# Patient Record
Sex: Female | Born: 1935
Health system: Southern US, Community
[De-identification: ages and names within clinical notes are randomized; demographics above are authoritative.]

## PROBLEM LIST (undated history)

## (undated) DIAGNOSIS — E506 Vitamin A deficiency with xerophthalmic scars of cornea: Secondary | ICD-10-CM

## (undated) DIAGNOSIS — I059 Rheumatic mitral valve disease, unspecified: Secondary | ICD-10-CM

## (undated) DIAGNOSIS — M19041 Primary osteoarthritis, right hand: Secondary | ICD-10-CM

## (undated) DIAGNOSIS — R569 Unspecified convulsions: Secondary | ICD-10-CM

## (undated) DIAGNOSIS — F0390 Unspecified dementia without behavioral disturbance: Secondary | ICD-10-CM

## (undated) DIAGNOSIS — R42 Dizziness and giddiness: Secondary | ICD-10-CM

## (undated) DIAGNOSIS — Z923 Personal history of irradiation: Secondary | ICD-10-CM

## (undated) DIAGNOSIS — M205X9 Other deformities of toe(s) (acquired), unspecified foot: Secondary | ICD-10-CM

## (undated) DIAGNOSIS — F070 Personality change due to known physiological condition: Secondary | ICD-10-CM

## (undated) DIAGNOSIS — L84 Corns and callosities: Secondary | ICD-10-CM

## (undated) DIAGNOSIS — M545 Low back pain, unspecified: Secondary | ICD-10-CM

## (undated) DIAGNOSIS — M461 Sacroiliitis, not elsewhere classified: Secondary | ICD-10-CM

## (undated) DIAGNOSIS — K5790 Diverticulosis of intestine, part unspecified, without perforation or abscess without bleeding: Secondary | ICD-10-CM

## (undated) DIAGNOSIS — M2550 Pain in unspecified joint: Secondary | ICD-10-CM

## (undated) DIAGNOSIS — C3490 Malignant neoplasm of unspecified part of unspecified bronchus or lung: Secondary | ICD-10-CM

## (undated) DIAGNOSIS — M19042 Primary osteoarthritis, left hand: Secondary | ICD-10-CM

## (undated) DIAGNOSIS — E559 Vitamin D deficiency, unspecified: Secondary | ICD-10-CM

## (undated) DIAGNOSIS — D649 Anemia, unspecified: Secondary | ICD-10-CM

## (undated) DIAGNOSIS — K921 Melena: Secondary | ICD-10-CM

## (undated) DIAGNOSIS — R300 Dysuria: Secondary | ICD-10-CM

## (undated) DIAGNOSIS — R7989 Other specified abnormal findings of blood chemistry: Secondary | ICD-10-CM

## (undated) DIAGNOSIS — F411 Generalized anxiety disorder: Secondary | ICD-10-CM

## (undated) DIAGNOSIS — E039 Hypothyroidism, unspecified: Secondary | ICD-10-CM

## (undated) DIAGNOSIS — M1990 Unspecified osteoarthritis, unspecified site: Secondary | ICD-10-CM

## (undated) HISTORY — DX: Personal history of irradiation: Z92.3

## (undated) HISTORY — DX: Sacroiliitis, not elsewhere classified: M46.1

## (undated) HISTORY — DX: Hypothyroidism, unspecified: E03.9

## (undated) HISTORY — DX: Primary osteoarthritis, left hand: M19.042

## (undated) HISTORY — DX: Rheumatic mitral valve disease, unspecified: I05.9

## (undated) HISTORY — DX: Generalized anxiety disorder: F41.1

## (undated) HISTORY — DX: Anemia, unspecified: D64.9

## (undated) HISTORY — DX: Other deformities of toe(s) (acquired), unspecified foot: M20.5X9

## (undated) HISTORY — DX: Dizziness and giddiness: R42

## (undated) HISTORY — PX: OTHER SURGICAL HISTORY: SHX169

## (undated) HISTORY — DX: Personality change due to known physiological condition: F07.0

## (undated) HISTORY — DX: Vitamin D deficiency, unspecified: E55.9

## (undated) HISTORY — DX: Primary osteoarthritis, right hand: M19.041

## (undated) HISTORY — DX: Unspecified osteoarthritis, unspecified site: M19.90

## (undated) HISTORY — DX: Pain in unspecified joint: M25.50

## (undated) HISTORY — DX: Unspecified convulsions: R56.9

## (undated) HISTORY — DX: Other specified abnormal findings of blood chemistry: R79.89

## (undated) HISTORY — DX: Corns and callosities: L84

## (undated) HISTORY — DX: Vitamin A deficiency with xerophthalmic scars of cornea: E50.6

## (undated) HISTORY — DX: Low back pain, unspecified: M54.50

## (undated) HISTORY — DX: Dysuria: R30.0

## (undated) HISTORY — DX: Low back pain: M54.5

## (undated) HISTORY — PX: JOINT REPLACEMENT: SHX530

## (undated) HISTORY — DX: Diverticulosis of intestine, part unspecified, without perforation or abscess without bleeding: K57.90

## (undated) HISTORY — PX: IVC FILTER PLACEMENT (ARMC HX): HXRAD1551

## (undated) HISTORY — DX: Melena: K92.1

---

## 1997-10-26 ENCOUNTER — Ambulatory Visit (HOSPITAL_COMMUNITY): Admission: RE | Admit: 1997-10-26 | Discharge: 1997-10-26 | Payer: Self-pay | Admitting: Neurosurgery

## 1997-11-09 ENCOUNTER — Ambulatory Visit (HOSPITAL_COMMUNITY): Admission: RE | Admit: 1997-11-09 | Discharge: 1997-11-09 | Payer: Self-pay | Admitting: Neurosurgery

## 2000-04-21 ENCOUNTER — Other Ambulatory Visit: Admission: RE | Admit: 2000-04-21 | Discharge: 2000-04-21 | Payer: Self-pay | Admitting: Internal Medicine

## 2001-06-16 ENCOUNTER — Encounter: Payer: Self-pay | Admitting: Internal Medicine

## 2002-07-06 ENCOUNTER — Other Ambulatory Visit: Admission: RE | Admit: 2002-07-06 | Discharge: 2002-07-06 | Payer: Self-pay | Admitting: Internal Medicine

## 2003-04-16 ENCOUNTER — Emergency Department (HOSPITAL_COMMUNITY): Admission: EM | Admit: 2003-04-16 | Discharge: 2003-04-16 | Payer: Self-pay | Admitting: Emergency Medicine

## 2003-05-20 HISTORY — PX: CERVICAL LAMINECTOMY: SHX94

## 2003-05-24 ENCOUNTER — Inpatient Hospital Stay (HOSPITAL_COMMUNITY): Admission: RE | Admit: 2003-05-24 | Discharge: 2003-05-30 | Payer: Self-pay | Admitting: Orthopaedic Surgery

## 2003-05-25 ENCOUNTER — Encounter (INDEPENDENT_AMBULATORY_CARE_PROVIDER_SITE_OTHER): Payer: Self-pay | Admitting: *Deleted

## 2003-08-26 ENCOUNTER — Ambulatory Visit (HOSPITAL_COMMUNITY): Admission: RE | Admit: 2003-08-26 | Discharge: 2003-08-26 | Payer: Self-pay | Admitting: Orthopaedic Surgery

## 2004-01-04 ENCOUNTER — Encounter: Admission: RE | Admit: 2004-01-04 | Discharge: 2004-01-04 | Payer: Self-pay | Admitting: Internal Medicine

## 2004-04-17 ENCOUNTER — Ambulatory Visit: Payer: Self-pay | Admitting: Gastroenterology

## 2004-05-23 ENCOUNTER — Ambulatory Visit: Payer: Self-pay | Admitting: Internal Medicine

## 2004-05-28 ENCOUNTER — Ambulatory Visit: Payer: Self-pay | Admitting: Internal Medicine

## 2004-05-31 ENCOUNTER — Ambulatory Visit: Payer: Self-pay | Admitting: Internal Medicine

## 2004-08-26 ENCOUNTER — Ambulatory Visit: Payer: Self-pay | Admitting: Internal Medicine

## 2004-10-09 ENCOUNTER — Ambulatory Visit: Payer: Self-pay | Admitting: Internal Medicine

## 2004-10-16 ENCOUNTER — Ambulatory Visit: Payer: Self-pay | Admitting: Internal Medicine

## 2004-12-17 ENCOUNTER — Ambulatory Visit: Payer: Self-pay | Admitting: Internal Medicine

## 2005-05-30 ENCOUNTER — Ambulatory Visit: Payer: Self-pay | Admitting: Internal Medicine

## 2005-06-06 ENCOUNTER — Encounter: Admission: RE | Admit: 2005-06-06 | Discharge: 2005-06-06 | Payer: Self-pay | Admitting: Internal Medicine

## 2005-06-16 ENCOUNTER — Encounter: Admission: RE | Admit: 2005-06-16 | Discharge: 2005-06-16 | Payer: Self-pay | Admitting: Internal Medicine

## 2005-06-25 ENCOUNTER — Encounter: Admission: RE | Admit: 2005-06-25 | Discharge: 2005-06-25 | Payer: Self-pay | Admitting: Internal Medicine

## 2005-09-04 ENCOUNTER — Ambulatory Visit: Payer: Self-pay | Admitting: Internal Medicine

## 2005-11-26 ENCOUNTER — Inpatient Hospital Stay (HOSPITAL_COMMUNITY): Admission: RE | Admit: 2005-11-26 | Discharge: 2005-12-01 | Payer: Self-pay | Admitting: Orthopaedic Surgery

## 2006-01-27 ENCOUNTER — Ambulatory Visit: Payer: Self-pay | Admitting: Internal Medicine

## 2006-02-03 ENCOUNTER — Encounter: Admission: RE | Admit: 2006-02-03 | Discharge: 2006-02-03 | Payer: Self-pay | Admitting: Internal Medicine

## 2006-03-13 ENCOUNTER — Ambulatory Visit: Payer: Self-pay | Admitting: Internal Medicine

## 2006-03-13 LAB — CONVERTED CEMR LAB: Phenytoin Lvl: 10.9 ug/mL (ref 10.0–20.0)

## 2006-04-07 ENCOUNTER — Ambulatory Visit: Payer: Self-pay | Admitting: Family Medicine

## 2006-04-28 ENCOUNTER — Ambulatory Visit: Payer: Self-pay | Admitting: Internal Medicine

## 2006-06-23 ENCOUNTER — Ambulatory Visit: Payer: Self-pay | Admitting: Internal Medicine

## 2006-07-03 ENCOUNTER — Encounter: Admission: RE | Admit: 2006-07-03 | Discharge: 2006-07-03 | Payer: Self-pay | Admitting: Internal Medicine

## 2006-08-11 ENCOUNTER — Ambulatory Visit: Payer: Self-pay | Admitting: Gastroenterology

## 2006-08-21 ENCOUNTER — Ambulatory Visit: Payer: Self-pay | Admitting: Gastroenterology

## 2006-08-21 ENCOUNTER — Encounter: Payer: Self-pay | Admitting: Internal Medicine

## 2006-11-02 ENCOUNTER — Ambulatory Visit: Payer: Self-pay | Admitting: Internal Medicine

## 2006-11-02 LAB — CONVERTED CEMR LAB
Phenytoin Lvl: 10.1 ug/mL (ref 10.0–20.0)
TSH: 1.95 microintl units/mL (ref 0.35–5.50)

## 2006-11-23 ENCOUNTER — Ambulatory Visit: Payer: Self-pay | Admitting: Internal Medicine

## 2006-12-01 ENCOUNTER — Ambulatory Visit: Payer: Self-pay | Admitting: Internal Medicine

## 2006-12-08 ENCOUNTER — Ambulatory Visit: Payer: Self-pay | Admitting: Internal Medicine

## 2006-12-08 DIAGNOSIS — K648 Other hemorrhoids: Secondary | ICD-10-CM | POA: Insufficient documentation

## 2006-12-08 DIAGNOSIS — E039 Hypothyroidism, unspecified: Secondary | ICD-10-CM

## 2006-12-08 DIAGNOSIS — M810 Age-related osteoporosis without current pathological fracture: Secondary | ICD-10-CM | POA: Insufficient documentation

## 2006-12-08 DIAGNOSIS — R42 Dizziness and giddiness: Secondary | ICD-10-CM

## 2006-12-08 DIAGNOSIS — K5730 Diverticulosis of large intestine without perforation or abscess without bleeding: Secondary | ICD-10-CM | POA: Insufficient documentation

## 2006-12-08 DIAGNOSIS — R569 Unspecified convulsions: Secondary | ICD-10-CM

## 2006-12-08 DIAGNOSIS — D126 Benign neoplasm of colon, unspecified: Secondary | ICD-10-CM

## 2006-12-14 ENCOUNTER — Telehealth: Payer: Self-pay | Admitting: *Deleted

## 2006-12-15 ENCOUNTER — Emergency Department (HOSPITAL_COMMUNITY): Admission: EM | Admit: 2006-12-15 | Discharge: 2006-12-15 | Payer: Self-pay | Admitting: Emergency Medicine

## 2006-12-21 ENCOUNTER — Encounter: Payer: Self-pay | Admitting: Internal Medicine

## 2007-01-11 ENCOUNTER — Encounter: Payer: Self-pay | Admitting: Internal Medicine

## 2007-02-03 ENCOUNTER — Ambulatory Visit: Payer: Self-pay | Admitting: Internal Medicine

## 2007-02-03 DIAGNOSIS — G252 Other specified forms of tremor: Secondary | ICD-10-CM | POA: Insufficient documentation

## 2007-02-03 DIAGNOSIS — M545 Low back pain: Secondary | ICD-10-CM

## 2007-02-03 DIAGNOSIS — G250 Essential tremor: Secondary | ICD-10-CM

## 2007-02-05 LAB — CONVERTED CEMR LAB
Phenytoin Lvl: 21.1 ug/mL — ABNORMAL HIGH (ref 10.0–20.0)
TSH: 2.17 microintl units/mL (ref 0.35–5.50)

## 2007-03-17 ENCOUNTER — Ambulatory Visit: Payer: Self-pay | Admitting: Internal Medicine

## 2007-03-29 ENCOUNTER — Encounter: Payer: Self-pay | Admitting: Internal Medicine

## 2007-04-12 ENCOUNTER — Encounter: Payer: Self-pay | Admitting: Internal Medicine

## 2007-05-05 ENCOUNTER — Ambulatory Visit: Payer: Self-pay | Admitting: Internal Medicine

## 2007-05-05 DIAGNOSIS — M1990 Unspecified osteoarthritis, unspecified site: Secondary | ICD-10-CM

## 2007-05-05 LAB — CONVERTED CEMR LAB
Basophils Absolute: 0 10*3/uL (ref 0.0–0.1)
Basophils Relative: 0.2 % (ref 0.0–1.0)
Eosinophils Absolute: 0.1 10*3/uL (ref 0.0–0.6)
Eosinophils Relative: 1.3 % (ref 0.0–5.0)
HCT: 38.6 % (ref 36.0–46.0)
Hemoglobin: 13.5 g/dL (ref 12.0–15.0)
Lymphocytes Relative: 21.6 % (ref 12.0–46.0)
MCHC: 35.1 g/dL (ref 30.0–36.0)
MCV: 99.5 fL (ref 78.0–100.0)
Monocytes Absolute: 0.5 10*3/uL (ref 0.2–0.7)
Monocytes Relative: 9.8 % (ref 3.0–11.0)
Neutro Abs: 3.1 10*3/uL (ref 1.4–7.7)
Neutrophils Relative %: 67.1 % (ref 43.0–77.0)
Phenytoin Lvl: 14.1 ug/mL (ref 10.0–20.0)
Platelets: 215 10*3/uL (ref 150–400)
RBC: 3.88 M/uL (ref 3.87–5.11)
RDW: 12.8 % (ref 11.5–14.6)
WBC: 4.7 10*3/uL (ref 4.5–10.5)

## 2007-08-23 ENCOUNTER — Telehealth: Payer: Self-pay | Admitting: Internal Medicine

## 2007-08-23 ENCOUNTER — Ambulatory Visit: Payer: Self-pay | Admitting: Internal Medicine

## 2007-08-23 LAB — CONVERTED CEMR LAB
GFR calc Af Amer: 125 mL/min
GFR calc non Af Amer: 103 mL/min
Glucose, Bld: 93 mg/dL (ref 70–99)
Potassium: 4.2 meq/L (ref 3.5–5.1)
Sodium: 145 meq/L (ref 135–145)

## 2007-11-22 ENCOUNTER — Ambulatory Visit: Payer: Self-pay | Admitting: Internal Medicine

## 2007-11-22 LAB — CONVERTED CEMR LAB
Basophils Relative: 0.2 % (ref 0.0–1.0)
Eosinophils Relative: 2 % (ref 0.0–5.0)
Lymphocytes Relative: 22.9 % (ref 12.0–46.0)
MCV: 100.4 fL — ABNORMAL HIGH (ref 78.0–100.0)
Neutrophils Relative %: 65.3 % (ref 43.0–77.0)
RBC: 4.02 M/uL (ref 3.87–5.11)
WBC: 4.8 10*3/uL (ref 4.5–10.5)

## 2008-02-10 ENCOUNTER — Encounter: Payer: Self-pay | Admitting: Internal Medicine

## 2008-02-10 DIAGNOSIS — R0989 Other specified symptoms and signs involving the circulatory and respiratory systems: Secondary | ICD-10-CM

## 2008-02-22 ENCOUNTER — Ambulatory Visit: Payer: Self-pay

## 2008-02-22 ENCOUNTER — Encounter: Payer: Self-pay | Admitting: Internal Medicine

## 2008-02-25 ENCOUNTER — Ambulatory Visit: Payer: Self-pay | Admitting: Internal Medicine

## 2008-02-25 DIAGNOSIS — F079 Unspecified personality and behavioral disorder due to known physiological condition: Secondary | ICD-10-CM | POA: Insufficient documentation

## 2008-04-28 ENCOUNTER — Ambulatory Visit: Payer: Self-pay | Admitting: Internal Medicine

## 2008-04-28 ENCOUNTER — Encounter: Payer: Self-pay | Admitting: Internal Medicine

## 2008-05-19 HISTORY — PX: CATARACT EXTRACTION W/ INTRAOCULAR LENS  IMPLANT, BILATERAL: SHX1307

## 2008-05-26 ENCOUNTER — Ambulatory Visit: Payer: Self-pay | Admitting: Internal Medicine

## 2008-07-28 ENCOUNTER — Ambulatory Visit: Payer: Self-pay | Admitting: Internal Medicine

## 2008-07-28 LAB — CONVERTED CEMR LAB
Phenytoin Lvl: 22.9 ug/mL — ABNORMAL HIGH (ref 10.0–20.0)
TSH: 1.59 microintl units/mL (ref 0.35–5.50)

## 2008-12-05 ENCOUNTER — Telehealth: Payer: Self-pay | Admitting: Internal Medicine

## 2009-02-23 ENCOUNTER — Ambulatory Visit: Payer: Self-pay | Admitting: Family Medicine

## 2009-03-06 ENCOUNTER — Ambulatory Visit: Payer: Self-pay | Admitting: Internal Medicine

## 2009-03-06 DIAGNOSIS — B070 Plantar wart: Secondary | ICD-10-CM

## 2009-03-06 LAB — CONVERTED CEMR LAB
Cholesterol: 235 mg/dL — ABNORMAL HIGH (ref 0–200)
Direct LDL: 108.2 mg/dL

## 2009-07-23 ENCOUNTER — Telehealth: Payer: Self-pay | Admitting: Internal Medicine

## 2009-07-24 ENCOUNTER — Ambulatory Visit: Payer: Self-pay | Admitting: Internal Medicine

## 2009-07-24 DIAGNOSIS — M19049 Primary osteoarthritis, unspecified hand: Secondary | ICD-10-CM | POA: Insufficient documentation

## 2009-08-12 ENCOUNTER — Emergency Department (HOSPITAL_COMMUNITY): Admission: EM | Admit: 2009-08-12 | Discharge: 2009-08-12 | Payer: Self-pay | Admitting: Emergency Medicine

## 2009-08-24 ENCOUNTER — Ambulatory Visit: Payer: Self-pay | Admitting: Family Medicine

## 2009-09-03 ENCOUNTER — Ambulatory Visit: Payer: Self-pay | Admitting: Internal Medicine

## 2009-09-03 DIAGNOSIS — S12600A Unspecified displaced fracture of seventh cervical vertebra, initial encounter for closed fracture: Secondary | ICD-10-CM | POA: Insufficient documentation

## 2009-09-03 DIAGNOSIS — M25519 Pain in unspecified shoulder: Secondary | ICD-10-CM

## 2009-09-04 ENCOUNTER — Telehealth: Payer: Self-pay | Admitting: Internal Medicine

## 2009-09-05 ENCOUNTER — Encounter: Admission: RE | Admit: 2009-09-05 | Discharge: 2009-09-05 | Payer: Self-pay | Admitting: Internal Medicine

## 2009-09-19 ENCOUNTER — Ambulatory Visit: Payer: Self-pay | Admitting: Internal Medicine

## 2009-09-27 ENCOUNTER — Ambulatory Visit: Payer: Self-pay | Admitting: Internal Medicine

## 2009-10-29 ENCOUNTER — Ambulatory Visit: Payer: Self-pay | Admitting: Internal Medicine

## 2010-01-22 ENCOUNTER — Ambulatory Visit: Payer: Self-pay | Admitting: Internal Medicine

## 2010-01-22 LAB — CONVERTED CEMR LAB: TSH: 1.71 microintl units/mL (ref 0.35–5.50)

## 2010-01-30 ENCOUNTER — Ambulatory Visit: Payer: Self-pay | Admitting: Internal Medicine

## 2010-02-14 ENCOUNTER — Telehealth: Payer: Self-pay | Admitting: Internal Medicine

## 2010-02-19 ENCOUNTER — Telehealth: Payer: Self-pay | Admitting: Internal Medicine

## 2010-02-25 ENCOUNTER — Telehealth: Payer: Self-pay | Admitting: Internal Medicine

## 2010-04-30 ENCOUNTER — Ambulatory Visit: Payer: Self-pay | Admitting: Internal Medicine

## 2010-05-09 ENCOUNTER — Encounter
Admission: RE | Admit: 2010-05-09 | Discharge: 2010-05-09 | Payer: Self-pay | Source: Home / Self Care | Attending: Internal Medicine | Admitting: Internal Medicine

## 2010-05-09 LAB — HM MAMMOGRAPHY

## 2010-06-08 ENCOUNTER — Encounter: Payer: Self-pay | Admitting: Internal Medicine

## 2010-06-09 ENCOUNTER — Encounter: Payer: Self-pay | Admitting: Vascular Surgery

## 2010-06-09 ENCOUNTER — Encounter: Payer: Self-pay | Admitting: Internal Medicine

## 2010-06-18 NOTE — Progress Notes (Signed)
Summary: refill--wants advise from bonnye  Phone Note Refill Request Call back at [facility] Phone [phone] Message from:  Patient--live call  Refills Requested: Medication #1:  NITROFURANTOIN MACROCRYSTAL 100 MG CAPS one by mouth two times a day for 14 days.   Dosage confirmed as above?Dosage Confirmed send to [facility]. pt also mentioned that she is wobbling. wants Bonnye to return her call .  Initial call taken by: Warnell Forester,  February 14, 2010 10:41 AM  Follow-up for Phone Call        c/o dry mouth- told her it will get better since antibioitc has completed.call if not better Follow-up by: Willy Eddy, LPN,  February 14, 2010 1:40 PM

## 2010-06-18 NOTE — Progress Notes (Signed)
Summary: med refill  Phone Note Call from Patient Call back at Home Phone [phone]   Caller: Patient Call For: Stacie Glaze MD Summary of Call: pt needs refill on clonazepam call into [facility] [phone] Initial call taken by: Heron Sabins,  July 23, 2009 11:32 AM    Prescriptions: CLONAZEPAM 0.5 MG TABS (CLONAZEPAM) one two times a day  #180 x 1   Entered by:   Willy Eddy, LPN   Authorized by:   Stacie Glaze MD   Signed by:   Willy Eddy, LPN on 32/95/1884   Method used:   Telephoned to ...       [facility]* (retail)       [address]       [place], [place]  [id number]       Ph: [phone]       Fax: [phone]   RxID:   [id number]

## 2010-06-18 NOTE — Progress Notes (Signed)
Summary: breast exam  Phone Note Call from Patient Call back at Home Phone [phone]   Summary of Call: Dr. Shela Commons did not do breast exam that I need on the 14th.  What to do? Initial call taken by: Rudy Jew, RN,  February 25, 2010 10:31 AM  Follow-up for Phone Call        will do at next visit Follow-up by: Willy Eddy, LPN,  February 25, 2010 11:16 AM     Appended Document: breast exam left message on machine

## 2010-06-18 NOTE — Assessment & Plan Note (Signed)
Summary: F/U ON SHOULDER PAIN // RS   Vital Signs:  Patient profile:   77 year old female Height:      65 inches Weight:      124 pounds BMI:     20.71 Temp:     98.2 degrees F oral Pulse rate:   72 / minute Resp:     14 per minute BP sitting:   106 / 72  (left arm)  Vitals Entered By: Willy Eddy, LPN (September 03, 2009 9:26 AM) CC: roa- c/o shoulder pain- [place] - had mri of head and several xrays- wearing neck brace   CC:  roa- c/o shoulder pain- [place] - had mri of head and several xrays- wearing neck brace.  History of Present Illness: the CT showed mild sinusitis ( allergies... 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) 2)  Plantar Wart, Right  (ICD-078.12) 3)  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 1in The Am and Three At Bedtime 2)  Calcium 500/d 500-125 Mg-Unit   Tabs (Calcium Carbonate-Vitamin D) .... 2 Every Day 3)  Synthroid 100 Mcg  Tabs (Levothyroxine Sodium) .... Once Daily 4)  Clonazepam 0.5 Mg Tabs (Clonazepam) .... One Two Times A Day 5)  Vitamin D3 1000 Unit Caps (Cholecalciferol) .... One By Mouth Dialy 6)  Hydrocodone-Acetaminophen 5-325 Mg Tabs (Hydrocodone-Acetaminophen) .Marland Kitchen.. 1-2 By Mouth Q 6 Hours As Needed Pain 7)  Vimovo 500-20 Mg Tbec (Naproxen-Esomeprazole) .... One By Mouth Two Times A Day  Allergies (verified): No Known Drug Allergies  Past History:  Family History: Last updated: Aug 13, 2008 father and mother died in old age  Social History: Last updated: 02/03/2007 Retired Single Former Smoker  Risk Factors: Smoking Status: quit (09/03/2009)  Past medical, surgical, family and social histories (including risk factors) reviewed, and no changes noted (except as noted below).  Past Medical History: Reviewed history from 05/05/2007 and no changes required. Osteoporosis Seizure disorder DJD 715.90 Hypothyroidism Diverticulosis, colon internal hemorrhoids 455.0 Dizziness or vertigo Low back pain mitral valve thickened Osteoarthritis  Past Surgical History: Reviewed history from 11/22/2007 and no changes required. Total knee replacement x 3 Cervical laminectomy lumbarr injections  for pain bletheroplasty  Family History: Reviewed history from Aug 13, 2008 and no changes required. father and mother died in old age  Social History: Reviewed history from 02/03/2007 and no changes  required. Retired Single Former Smoker  Review of Systems  The patient denies anorexia, fever, weight loss, weight gain, vision loss, decreased hearing, hoarseness, chest pain, syncope, dyspnea on exertion, peripheral edema, prolonged cough, headaches, hemoptysis, abdominal pain, melena, hematochezia, severe indigestion/heartburn, hematuria, incontinence, genital sores, muscle weakness, suspicious skin lesions, transient blindness,  difficulty walking, depression, unusual weight change, abnormal bleeding, enlarged lymph nodes, angioedema, breast masses, and testicular masses.    Physical Exam  General:  patient is alert cooperative in no distress. Head:  she has some resolving ecchymoses around both eyes. Otherwise head is atraumatic. Eyes:  pupils equal, pupils round, and pupils reactive to light.   Ears:  R ear normal and L ear normal.   Nose:  no external deformity and no nasal discharge.   Mouth:  pharynx pink and moist and no erythema.   Neck:  patient has neck brace on her this is not removed Lungs:  Normal respiratory effort, chest expands symmetrically. Lungs are clear to auscultation, no crackles or wheezes. Heart:  normal rate and regular rhythm.   Abdomen:  soft, non-tender, and normal bowel sounds.   Msk:  patient has some localized tenderness left upper back soft tissue muscle left trapezius. No visible swelling or rash. Extremities:  No clubbing, cyanosis, edema, or deformity noted with normal full range of motion of all joints.   Neurologic:  No cranial nerve deficits noted. Station and gait are normal. Plantar reflexes are down-going bilaterally. DTRs are symmetrical throughout. Sensory, motor and coordinative functions appear intact.   Impression & Recommendations:  Problem # 1:  SEIZURE DISORDER (ICD-780.39)  Her updated medication list for this problem includes:    Dilantin 100 Mg Caps (Phenytoin sodium extended) .Marland Kitchen... 1in the am and three at bedtime    Clonazepam 0.5 Mg Tabs (Clonazepam) ..... One two times a day  Labs Reviewed: Hgb: 14.0 (11/22/2007)   Hct: 40.4 (11/22/2007)   WBC: 4.8 (11/22/2007)   RBC: 4.02 (11/22/2007)   Plts: 250 (11/22/2007) Dilantin: 12.9 (07/24/2009)     Problem # 2:  SHOULDER PAIN, LEFT (ICD-719.41) there was a possible c7 fracture of the neck seen on the CT may stop the neck brace  after 6  weeks first week in MAY  Her updated medication list for this problem  includes:    Hydrocodone-acetaminophen 5-325 Mg Tabs (Hydrocodone-acetaminophen) .Marland Kitchen... 1-2 by mouth q 6 hours as needed pain    Vimovo 500-20 Mg Tbec (Naproxen-esomeprazole) ..... One by mouth two times a day  Discussed shoulder exercises, use of moist heat or ice, and medication.   Problem # 3:  CLOS FX C7 VERTEBRA W/O MENTION SP CRD INJURY (ICD-805.07) the pt has persistant pain with possible referral to the shoulder she is wearing a solf collar but still works in the yard! she need to stop! until the MRI if the MRI is stable will refer for PT  Orders: Radiology Referral (Radiology) not the results of the CT  Complete Medication List: 1)  Dilantin 100 Mg Caps (Phenytoin sodium extended) .Marland Kitchen.. 1in the am and three at bedtime 2)  Calcium 500/d 500-125 Mg-unit Tabs (Calcium carbonate-vitamin d) .... 2 every day 3)  Synthroid 100 Mcg Tabs (Levothyroxine sodium) .... Once daily 4)  Clonazepam 0.5 Mg Tabs (Clonazepam) .... One two times a day 5)  Vitamin D3 1000 Unit Caps (Cholecalciferol) .... One by mouth dialy 6)  Hydrocodone-acetaminophen 5-325 Mg Tabs (Hydrocodone-acetaminophen) .Marland Kitchen.. 1-2 by mouth q 6 hours as needed pain 7)  Vimovo 500-20 Mg  Tbec (Naproxen-esomeprazole) .... One by mouth two times a day  Patient Instructions: 1)  hold off on exerecize for now 2)  you may walk on treadmill on in the neighborhood 3)  no not do upper body 4)  no yard work 5)  MRI this week and we will call with result  ( ) 6)  Please schedule a follow-up appointment in 2 weeks.

## 2010-06-18 NOTE — Assessment & Plan Note (Signed)
Summary: F/U/CB   Vital Signs:  Patient profile:   77 year old female Height:      65 inches Weight:      126 pounds BMI:     21.04 Temp:     98.2 degrees F oral Pulse rate:   72 / minute Resp:     14 per minute BP sitting:   120 / 70  (left arm)  Vitals Entered By: Willy Eddy, LPN (October 29, 2009 9:14 AM) CC: e month roa   CC:  e month roa.  History of Present Illness: no problems with neck but has some shoulder pain with cutting the grass... the other day she cut her lawn due to her "man being late" the pt has taken less hydrocodone ( lower dose given due to fall hx) has resumed exercize classes and occasionally has pain that she  has pain the 5/325 is what she sees as weeker she wants the 5/500! 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) 8)  Carotid Bruit  (ICD-785.9) 9)  Osteoarthritis  (ICD-715.90) 10)  Tremor, Essential  (ICD-333.1) 11)  Hemorrhoids, Internal w/o Complication  (ICD-455.0) 12)  Low Back Pain  (ICD-724.2) 13)  Dizziness or Vertigo  (ICD-780.4) 14)  Colonic Polyps  (ICD-211.3) 15)  Hemorrhoids, Internal w/o Complication  (ICD-455.0) 16)  Diverticulosis, Colon  (ICD-562.10) 17)  Hypothyroidism  (ICD-244.9) 18)  Seizure Disorder  (ICD-780.39) 19)  Osteoporosis  (ICD-733.00)  Current Problems (verified): 1)  Cerumen Impaction, Right  (ICD-380.4) 2)  Plantar Wart  (ICD-078.12) 3)  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  (ICD-805.07) 4)  Shoulder Pain, Left  (ICD-719.41) 5)  Osteoarthritis, Hand, Right  (ICD-715.94) 6)  Plantar Wart, Right   (ICD-078.12) 7)  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 1in The Am and Three At Bedtime 2)  Calcium 500/d 500-125 Mg-Unit  Tabs (Calcium Carbonate-Vitamin D) .... 2 Every Day 3)  Synthroid 100 Mcg  Tabs (Levothyroxine Sodium) .... Once Daily 4)  Clonazepam 0.5 Mg Tabs (Clonazepam) .... One Two Times A Day 5)  Vitamin D3 1000 Unit Caps (Cholecalciferol) .... One By Mouth Dialy 6)  Hydrocodone-Acetaminophen 5-325 Mg Tabs (Hydrocodone-Acetaminophen) .Marland Kitchen.. 1-2 By Mouth Q 6 Hours As Needed Pain 7)  Vimovo 500-20 Mg Tbec (Naproxen-Esomeprazole) .... One By Mouth Two Times A Day  Current Medications (verified): 1)  Dilantin 100 Mg  Caps (Phenytoin Sodium Extended) .Marland Kitchen.. 1in The Am and Three At Bedtime 2)  Calcium 500/d 500-125 Mg-Unit  Tabs (Calcium Carbonate-Vitamin D) .... 2 Every Day 3)  Synthroid 100  Mcg  Tabs (Levothyroxine Sodium) .... Once Daily 4)  Clonazepam 0.5 Mg Tabs (Clonazepam) .... One Two Times A Day 5)  Vitamin D3 1000 Unit Caps (Cholecalciferol) .... One By Mouth Dialy 6)  Hydrocodone-Acetaminophen 5-500 Mg Tabs (Hydrocodone-Acetaminophen) .... One By Mouth Q 8 Hour As Needed Pain in Back 7)  Vimovo 500-20 Mg Tbec (Naproxen-Esomeprazole) .... One By Mouth Daily Prn  Allergies (verified): No Known Drug Allergies  Past History:  Family History: Last updated: 08-20-08 father and mother died in old age  Social History: Last  updated: 02/03/2007 Retired Single Former Smoker  Risk Factors: Smoking Status: quit (10/29/2009)  Past medical, surgical, family and social histories (including risk factors) reviewed, and no changes noted (except as noted below).  Past Medical History: Reviewed history from 05/05/2007 and no changes required. Osteoporosis Seizure disorder DJD 715.90 Hypothyroidism Diverticulosis, colon internal hemorrhoids 455.0 Dizziness or vertigo Low back pain mitral valve thickened Osteoarthritis  Past Surgical History: Reviewed history from 11/22/2007 and no changes required. Total knee replacement x 3 Cervical laminectomy lumbarr injections  for pain bletheroplasty  Family History: Reviewed history from 08-20-08 and no changes required. father and mother died in old age  Social History: Reviewed history from 02/03/2007 and no changes required. Retired Single Former Smoker  Review of Systems  The patient denies anorexia, fever, weight loss, weight gain, vision loss, decreased hearing, hoarseness, chest pain, syncope, dyspnea on exertion, peripheral edema, prolonged cough, headaches, hemoptysis, abdominal pain, melena, hematochezia, severe indigestion/heartburn, hematuria, incontinence, genital sores, muscle weakness, suspicious skin lesions, transient blindness, difficulty walking, depression, unusual weight change, abnormal bleeding, enlarged lymph nodes, angioedema, and breast masses.    Physical Exam  General:  patient is alert cooperative in no distress.pale.   Head:  she has some resolving ecchymoses around both eyes. Otherwise head is atraumatic. Eyes:  pupils equal, pupils round, and pupils reactive to light.   Ears:  L ear normal and R cerumen impaction.   Nose:  no external deformity and no nasal discharge.   Mouth:  pharynx pink and moist and no erythema.   Neck:  supple and full ROM.   Lungs:  normal respiratory effort and no wheezes.   Heart:  normal rate and  regular rhythm.   Abdomen:  Bowel sounds positive,abdomen soft and non-tender without masses, organomegaly or hernias noted.   Impression & Recommendations:  Problem # 1:  CLOS FX C7 VERTEBRA W/O MENTION SP CRD INJURY (ICD-805.07) stable without any sequela.... needs to avoid fall risk activity!  Problem # 2:  LOW BACK PAIN (ICD-724.2) the vimovo workes will The following medications were removed from the medication list:    Vimovo 500-20 Mg Tbec (Naproxen-esomeprazole) ..... One by mouth two times a day Her updated medication list for this problem includes:    Hydrocodone-acetaminophen 5-500 Mg Tabs (Hydrocodone-acetaminophen) ..... One by mouth q 8 hour as needed pain in back    Vimovo 500-20 Mg Tbec (Naproxen-esomeprazole) ..... One by mouth daily prn  Discussed use of moist heat or ice, modified activities, medications, and stretching/strengthening exercises. Back care instructions given. To be seen in 2 weeks if no improvement; sooner if worsening of symptoms.   Problem # 3:  SEIZURE DISORDER (ICD-780.39)  Her updated medication list for this problem includes:    Dilantin 100 Mg Caps (Phenytoin sodium extended) .Marland Kitchen... 1in the am and three at bedtime    Clonazepam 0.5 Mg Tabs (Clonazepam) ..... One two times a day  Labs Reviewed: Hgb: 14.0 (11/22/2007)  Hct: 40.4 (11/22/2007)   WBC: 4.8 (11/22/2007)   RBC: 4.02 (11/22/2007)   Plts: 250 (11/22/2007) Dilantin: 12.9 (07/24/2009)     Problem # 4:  HYPOTHYROIDISM (ICD-244.9)  Her updated medication list for this problem includes:    Synthroid 100 Mcg Tabs (Levothyroxine sodium) ..... Once daily  Labs Reviewed: TSH: 1.67 (07/24/2009)    Chol: 235 (03/06/2009)   HDL: 108.40 (03/06/2009)     Complete Medication List: 1)  Dilantin 100 Mg Caps (Phenytoin sodium extended) .Marland Kitchen.. 1in the am and three at bedtime 2)  Calcium 500/d 500-125 Mg-unit Tabs (Calcium carbonate-vitamin d) .... 2 every day 3)  Synthroid 100 Mcg Tabs  (Levothyroxine sodium) .... Once daily 4)  Clonazepam 0.5 Mg Tabs (Clonazepam) .... One two times a day 5)  Vitamin D3 1000 Unit Caps (Cholecalciferol) .... One by mouth dialy 6)  Hydrocodone-acetaminophen 5-500 Mg Tabs (Hydrocodone-acetaminophen) .... One by mouth q 8 hour as needed pain in back 7)  Vimovo 500-20 Mg Tbec (Naproxen-esomeprazole) .... One by mouth daily prn  Patient Instructions: 1)  sept ROV 2)  dilatin level  780.39 3)  TSH prior to visit, ICD-9:244.9 Prescriptions: HYDROCODONE-ACETAMINOPHEN 5-500 MG TABS (HYDROCODONE-ACETAMINOPHEN) one by mouth q 8 hour as needed pain in back  #30 x 1   Entered and Authorized by:   Stacie Glaze MD   Signed by:   Stacie Glaze MD on 10/29/2009   Method used:   Print then Give to Patient   RxID:   [id number]

## 2010-06-18 NOTE — Assessment & Plan Note (Signed)
Summary: pts ear stopped up/cjr   Vital Signs:  Patient profile:   77 year old female Height:      65 inches Weight:      124 pounds BMI:     20.71 Temp:     98.2 degrees F oral Pulse rate:   72 / minute Resp:     14 per minute BP sitting:   110 / 70  (left arm)  Vitals Entered By: Willy Eddy, LPN (Sep 27, 2009 9:07 AM) CC: c/o ears stopped up   CC:  c/o ears stopped up.  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.07) 2)  Shoulder Pain, Left  (ICD-719.41) 3)  Osteoarthritis, Hand, Right  (ICD-715.94) 4)  Plantar Wart, Right  (ICD-078.12) 5)  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) 6)  Carotid Bruit  (ICD-785.9) 7)  Osteoarthritis  (ICD-715.90) 8)  Tremor, Essential  (ICD-333.1) 9)  Hemorrhoids, Internal w/o Complication  (ICD-455.0) 10)  Low Back Pain  (ICD-724.2) 11)  Dizziness or Vertigo  (ICD-780.4) 12)  Colonic Polyps  (ICD-211.3) 13)  Hemorrhoids, Internal w/o Complication  (ICD-455.0) 14)  Diverticulosis, Colon  (ICD-562.10) 15)  Hypothyroidism  (ICD-244.9) 16)  Seizure Disorder  (ICD-780.39) 17)  Osteoporosis  (ICD-733.00)  Current Problems (verified): 1)  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 1in The Am and Three At Bedtime 2)  Calcium 500/d 500-125 Mg-Unit  Tabs (Calcium Carbonate-Vitamin D) .... 2 Every Day 3)  Synthroid 100 Mcg  Tabs (Levothyroxine Sodium) .... Once Daily 4)  Clonazepam 0.5 Mg Tabs (Clonazepam) .... One Two Times A Day 5)  Vitamin D3 1000 Unit Caps (Cholecalciferol) .... One By Mouth Dialy 6)  Hydrocodone-Acetaminophen 5-325 Mg Tabs (Hydrocodone-Acetaminophen) .Marland Kitchen.. 1-2 By Mouth Q 6 Hours As Needed Pain 7)  Vimovo 500-20 Mg Tbec (Naproxen-Esomeprazole) .... One By Mouth Two Times A Day  Current Medications (verified): 1)  Dilantin 100 Mg  Caps (Phenytoin Sodium Extended) .Marland Kitchen.. 1in The Am and Three At Bedtime 2)  Calcium 500/d 500-125 Mg-Unit  Tabs (Calcium Carbonate-Vitamin D) .... 2 Every Day 3)  Synthroid 100 Mcg  Tabs (Levothyroxine Sodium) .... Once Daily 4)  Clonazepam 0.5 Mg Tabs (Clonazepam) .... One Two Times A Day 5)  Vitamin D3 1000 Unit Caps (Cholecalciferol) .Marland KitchenMarland KitchenMarland Kitchen  One By Mouth Dialy 6)  Hydrocodone-Acetaminophen 5-325 Mg Tabs (Hydrocodone-Acetaminophen) .Marland Kitchen.. 1-2 By Mouth Q 6 Hours As Needed Pain 7)  Vimovo 500-20 Mg Tbec (Naproxen-Esomeprazole) .... One By Mouth Two Times A Day  Allergies (verified): No Known Drug Allergies  Past History:  Family History: Last updated: 08/26/08 father and mother died in old age  Social History: Last updated: 02/03/2007 Retired Single Former Smoker  Risk Factors: Smoking Status: quit (09/27/2009)  Past medical, surgical, family and social histories (including  risk factors) reviewed, and no changes noted (except as noted below).  Past Medical History: Reviewed history from 05/05/2007 and no changes required. Osteoporosis Seizure disorder DJD 715.90 Hypothyroidism Diverticulosis, colon internal hemorrhoids 455.0 Dizziness or vertigo Low back pain mitral valve thickened Osteoarthritis  Past Surgical History: Reviewed history from 11/22/2007 and no changes required. Total knee replacement x 3 Cervical laminectomy lumbarr injections  for pain bletheroplasty  Family History: Reviewed history from 08/26/08 and no changes required. father and mother died in old age  Social History: Reviewed history from 02/03/2007 and no changes required. Retired Single Former Smoker  Review of Systems  The patient denies anorexia, fever, weight loss, weight gain, vision loss, decreased hearing, hoarseness, chest pain, syncope, dyspnea on exertion, peripheral edema, prolonged cough, headaches, hemoptysis, abdominal pain, melena, hematochezia, severe indigestion/heartburn, hematuria, incontinence, genital sores, muscle weakness, suspicious skin lesions, transient blindness, difficulty walking, depression, unusual weight change, abnormal bleeding, enlarged lymph nodes, angioedema, and breast masses.    Physical Exam  General:  patient is alert cooperative in no distress.pale.   Head:  she has some resolving ecchymoses around both eyes. Otherwise head is atraumatic. Eyes:  pupils equal, pupils round, and pupils reactive to light.   Ears:  L ear normal and R cerumen impaction.   Nose:  no external deformity and no nasal discharge.   Neck:  supple and full ROM.   Lungs:  normal respiratory effort and no wheezes.   Heart:  normal rate and regular rhythm.   Abdomen:  Bowel sounds positive,abdomen soft and non-tender without masses, organomegaly or hernias noted. Msk:  no joint warmth, decreased ROM, joint tenderness, and joint swelling.     Impression  & Recommendations:  Problem # 1:  PLANTAR WART (EAV-409.81) Assessment Deteriorated  the lesion was identifies as a        plantar   and 40 seconds of cryotherapy with the liguid nitrogen gun was apllied to the site. The pt tolerated the procedure and post procedure care was discussed  Orders: Wart Destruct <14 (17110)  Problem # 2:  CLOS FX C7 VERTEBRA W/O MENTION SP CRD INJURY (ICD-805.07) Assessment: Unchanged the pt has been stable for over 4 weeks and is out of the collar but will remain on activity and exercize limitations for 3 additonal monts  Problem # 3:  SEIZURE DISORDER (ICD-780.39) Assessment: Unchanged stable  reviewed hte levels taken at last ov Her updated medication list for this problem includes:    Dilantin 100 Mg Caps (Phenytoin sodium extended) .Marland Kitchen... 1in the am and three at bedtime    Clonazepam 0.5 Mg Tabs (Clonazepam) ..... One two times a day  Problem # 4:  CERUMEN IMPACTION, RIGHT (ICD-380.4) Assessment: Unchanged  informed conset obtained, using a cerumin spoon the wax impaction was dislodged and the canal was lavaged with 1/2 peroxide and 1/2 warm water solution until clear  Orders: Cerumen Impaction Removal (19147)  Complete Medication List: 1)  Dilantin 100 Mg Caps (Phenytoin sodium extended) .Marland Kitchen.. 1in  the am and three at bedtime 2)  Calcium 500/d 500-125 Mg-unit Tabs (Calcium carbonate-vitamin d) .... 2 every day 3)  Synthroid 100 Mcg Tabs (Levothyroxine sodium) .... Once daily 4)  Clonazepam 0.5 Mg Tabs (Clonazepam) .... One two times a day 5)  Vitamin D3 1000 Unit Caps (Cholecalciferol) .... One by mouth dialy 6)  Hydrocodone-acetaminophen 5-325 Mg Tabs (Hydrocodone-acetaminophen) .Marland Kitchen.. 1-2 by mouth q 6 hours as needed pain 7)  Vimovo 500-20 Mg Tbec (Naproxen-esomeprazole) .... One by mouth two times a day

## 2010-06-18 NOTE — Progress Notes (Signed)
Summary: REQ FOR RETURN CALL  Phone Note Call from Patient   Caller: Patient   [phone] Summary of Call: Pt called to adv that she is still experiencing dry mouth - adv she has been drinking plenty of fluids, using hard candy but adv her mouth and lips are still very dry...finished taking abx last week.... wants to know what to do?...Marland KitchenMarland KitchenPt can be reached at [phone].   Initial call taken by: Debbra Riding,  February 19, 2010 11:05 AM  Follow-up for Phone Call        get biotin oral rinse otc and try    twice a day per dr Lovell Sheehan                         Follow-up by: Willy Eddy, LPN,  February 19, 2010 12:11 PM  Additional Follow-up for Phone Call Additional follow up Details #1::        Notified pt. Additional Follow-up by: Lynann Beaver CMA,  February 19, 2010 1:19 PM

## 2010-06-18 NOTE — Assessment & Plan Note (Signed)
Summary: pt fell//ccm   Vital Signs:  Patient profile:   77 year old female Temp:     98.0 degrees F BP sitting:   102 / 80  History of Present Illness: Patient fell 2 weeks ago.  She was out working in yard picking up sticks and fell forward and landed on her head. Facial contusions. Evaluated in emergency room with cervical spine films and referral to neurosurgeon. No reported fracture. Has neck brace on. Now complaining of left shoulder pain.  Pain is actually more left trapezius region. Fairly localized. Sharp pain worse with movement. Patient requesting injection. Good range of motion left shoulder and no left shoulder weakness. No radiculopathy symptoms. Denies left upper extremity weakness or numbness. Prescribed Vicodin with minimal relief. Also taking anti-inflammatory medication.  heat has not provided much relief. Followup with neurosurgeon next week.  denies any extremity injuries.  Allergies: No Known Drug Allergies  Past History:  Past Medical History: Last updated: 05/05/2007 Osteoporosis Seizure disorder DJD 715.90 Hypothyroidism Diverticulosis, colon internal hemorrhoids 455.0 Dizziness or vertigo Low back pain mitral valve thickened Osteoarthritis  Past Surgical History: Last updated: 11/22/2007 Total knee replacement x 3 Cervical laminectomy lumbarr injections  for pain bletheroplasty  Social History: Last updated: 02/03/2007 Retired Single Former Smoker PMH reviewed for relevance, SH/Risk Factors reviewed for relevance  Review of Systems  The patient denies chest pain, syncope, dyspnea on exertion, peripheral edema, headaches, abdominal pain, melena, hematochezia, severe indigestion/heartburn, muscle weakness, and difficulty walking.    Physical Exam  General:  patient is alert cooperative in no distress. Head:  she has some resolving ecchymoses around both eyes. Otherwise head is atraumatic. Eyes:  pupils equal, pupils round, and pupils  reactive to light.   Ears:  External ear exam shows no significant lesions or deformities.  Otoscopic examination reveals clear canals, tympanic membranes are intact bilaterally without bulging, retraction, inflammation or discharge. Hearing is grossly normal bilaterally. Mouth:  Oral mucosa and oropharynx without lesions or exudates.  Teeth in good repair. Neck:  patient has neck brace on her this is not removed Lungs:  Normal respiratory effort, chest expands symmetrically. Lungs are clear to auscultation, no crackles or wheezes. Heart:  normal rate and regular rhythm.   Msk:  patient has some localized tenderness left upper back soft tissue muscle left trapezius. No visible swelling or rash. Extremities:  No clubbing, cyanosis, edema, or deformity noted with normal full range of motion of all joints.  excellent range of motion left shoulder. No impingement signs or symptoms Neurologic:  no focal weakness upper extremities. Symmetric reflexes. Normal sensory function. Skin:  no rashes Psych:  normally interactive, good eye contact, and not depressed appearing.     Impression & Recommendations:  Problem # 1:  BACK PAIN, UPPER (ICD-724.5) Assessment New  patient requesting injection in the area of maximal pain left upper back. Reviewed risk and benefits. Patient consented. Skin prepped with Betadine. Using 25-gauge needle 1/2 cc Xylocaine plain and 20 mg Depo-Medrol injected into the area of maximal tenderness. Patient tolerated well. Refill hydrocodone Her updated medication list for this problem includes:    Hydrocodone-acetaminophen 5-325 Mg Tabs (Hydrocodone-acetaminophen) .Marland Kitchen... 1-2 by mouth q 6 hours as needed pain  Orders: Trigger Point Injection (1 or 2 muscles) (60454)  Problem # 2:  CONTUSION, FACE (ICD-920) Assessment: New  Complete Medication List: 1)  Dilantin 100 Mg Caps (Phenytoin sodium extended) .Marland Kitchen.. 1in the am and three at bedtime 2)  Calcium 500/d 500-125 Mg-unit Tabs  (  Calcium carbonate-vitamin d) .... 2 every day 3)  Synthroid 100 Mcg Tabs (Levothyroxine sodium) .... Once daily 4)  Clonazepam 0.5 Mg Tabs (Clonazepam) .... One two times a day 5)  Vitamin D3 1000 Unit Caps (Cholecalciferol) .... One by mouth dialy 6)  Hydrocodone-acetaminophen 5-325 Mg Tabs (Hydrocodone-acetaminophen) .Marland Kitchen.. 1-2 by mouth q 6 hours as needed pain  Patient Instructions: 1)  Try heat or ice for symptomatic relief Prescriptions: HYDROCODONE-ACETAMINOPHEN 5-325 MG TABS (HYDROCODONE-ACETAMINOPHEN) 1-2 by mouth q 6 hours as needed pain  #30 x 0   Entered and Authorized by:   Evelena Peat MD   Signed by:   Evelena Peat MD on 08/24/2009   Method used:   Print then Give to Patient   RxID:   [id number]

## 2010-06-18 NOTE — Assessment & Plan Note (Signed)
Summary: 2 wk rov/njr   Vital Signs:  Patient profile:   77 year old female Height:      65 inches Weight:      124 pounds BMI:     20.71 Temp:     98.2 degrees F oral Pulse rate:   72 / minute Resp:     14 per minute BP sitting:   110 / 70  (left arm)  Vitals Entered By: Willy Eddy, LPN (Sep 20, 7251 12:35 PM) CC: 2 week rov-   vimovo works- no pain   CC:  2 week rov-   vimovo works- no pain.  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  (ICD-805.07) 4)  Shoulder Pain, Left  (ICD-719.41) 5)  Osteoarthritis, Hand, Right  (ICD-715.94) 6)  Plantar Wart, Right  (ICD-078.12) 7)  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) 8)  Carotid Bruit  (ICD-785.9) 9)  Osteoarthritis  (ICD-715.90) 10)  Tremor, Essential  (ICD-333.1) 11)  Hemorrhoids, Internal w/o Complication  (ICD-455.0) 12)  Low Back Pain  (ICD-724.2) 13)  Dizziness or Vertigo  (ICD-780.4) 14)  Colonic Polyps  (ICD-211.3) 15)  Hemorrhoids, Internal w/o Complication  (ICD-455.0) 16)  Diverticulosis, Colon  (ICD-562.10) 17)  Hypothyroidism  (ICD-244.9) 18)  Seizure Disorder  (ICD-780.39) 19)  Osteoporosis  (ICD-733.00)  Current Problems (verified): 1)  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 1in The Am and Three At Bedtime 2)  Calcium 500/d 500-125 Mg-Unit  Tabs (Calcium Carbonate-Vitamin D) .... 2 Every Day 3)  Synthroid 100 Mcg  Tabs (Levothyroxine Sodium) .... Once Daily 4)  Clonazepam 0.5 Mg Tabs (Clonazepam) .... One Two Times A Day 5)  Vitamin D3 1000 Unit Caps (Cholecalciferol) .... One By Mouth Dialy 6)  Hydrocodone-Acetaminophen 5-325 Mg Tabs (Hydrocodone-Acetaminophen) .Marland Kitchen.. 1-2 By Mouth Q 6 Hours As Needed Pain 7)  Vimovo 500-20 Mg Tbec (Naproxen-Esomeprazole) .... One By Mouth Two Times A Day  Current Medications (verified): 1)  Dilantin 100 Mg  Caps (Phenytoin Sodium Extended) .Marland Kitchen.. 1in The Am and Three At Bedtime 2)  Calcium 500/d 500-125 Mg-Unit  Tabs (Calcium Carbonate-Vitamin D) .... 2 Every Day 3)  Synthroid 100 Mcg  Tabs (Levothyroxine Sodium) .... Once Daily 4)  Clonazepam 0.5 Mg Tabs (Clonazepam) .... One Two  Times A Day 5)  Vitamin D3 1000 Unit Caps (Cholecalciferol) .... One By Mouth Dialy 6)  Hydrocodone-Acetaminophen 5-325 Mg Tabs (Hydrocodone-Acetaminophen) .Marland Kitchen.. 1-2 By Mouth Q 6 Hours As Needed Pain 7)  Vimovo 500-20 Mg Tbec (Naproxen-Esomeprazole) .... One By Mouth Two Times A Day  Allergies (verified): No Known Drug Allergies  Past History:  Family History: Last updated: 08/07/08 father and mother died in old age  Social History: Last updated: 02/03/2007 Retired Single Former Smoker  Risk Factors: Smoking Status: quit  (09/19/2009)  Past medical, surgical, family and social histories (including risk factors) reviewed, and no changes noted (except as noted below).  Past Medical History: Reviewed history from 05/05/2007 and no changes required. Osteoporosis Seizure disorder DJD 715.90 Hypothyroidism Diverticulosis, colon internal hemorrhoids 455.0 Dizziness or vertigo Low back pain mitral valve thickened Osteoarthritis  Past Surgical History: Reviewed history from 11/22/2007 and no changes required. Total knee replacement x 3 Cervical laminectomy lumbarr injections  for pain bletheroplasty  Family History: Reviewed history from 08-07-08 and no changes required. father and mother died in old age  Social History: Reviewed history from 02/03/2007 and no changes required. Retired Single Former Smoker  Review of Systems  The patient denies anorexia, fever, weight loss, weight gain, vision loss, decreased hearing, hoarseness, chest pain, syncope, dyspnea on exertion, peripheral edema, prolonged cough, headaches, hemoptysis, abdominal pain, melena, hematochezia, severe indigestion/heartburn, hematuria, incontinence, genital sores, muscle weakness, suspicious skin lesions, transient blindness, difficulty walking, depression, unusual weight change, abnormal bleeding, enlarged lymph nodes, angioedema, and breast masses.    Physical Exam  General:  patient is alert cooperative in no distress. Head:  she has some resolving ecchymoses around both eyes. Otherwise head is atraumatic. Eyes:  pupils equal, pupils round, and pupils reactive to light.   Ears:  R ear normal and L ear normal.   Nose:  no external deformity and no nasal discharge.   Mouth:  pharynx pink and moist and no erythema.   Neck:  patient has neck brace on her this is not removed Lungs:  Normal respiratory effort, chest expands symmetrically. Lungs are clear to auscultation, no crackles or wheezes. Heart:  normal rate and regular  rhythm.   Abdomen:  soft, non-tender, and normal bowel sounds.   Skin:  wart on dorsum of right great toe   Impression & Recommendations:  Problem # 1:  CLOS FX C7 VERTEBRA W/O MENTION SP CRD INJURY (ICD-805.07) limitations of activitys discussed stable  Problem # 2:  SHOULDER PAIN, LEFT (ICD-719.41) resolved use of vimovo stopped Her updated medication list for this problem includes:    Hydrocodone-acetaminophen 5-325 Mg Tabs (Hydrocodone-acetaminophen) .Marland Kitchen... 1-2 by mouth q 6 hours as needed pain    Vimovo 500-20 Mg Tbec (Naproxen-esomeprazole) ..... One by mouth two times a day  Problem # 3:  PLANTAR WART, RIGHT (ICD-078.12) the lesion was identifies as a   platar wart       and 40 seconds of cryotherapy with the liguid nitrogen gun was apllied to the site. The pt tolerated the procedure and post procedure care was discussed  Complete Medication List: 1)  Dilantin 100 Mg Caps (Phenytoin sodium extended) .Marland Kitchen.. 1in the am and three at bedtime 2)  Calcium 500/d 500-125 Mg-unit Tabs (Calcium carbonate-vitamin d) .... 2 every day 3)  Synthroid 100 Mcg Tabs (Levothyroxine sodium) .... Once daily 4)  Clonazepam 0.5 Mg Tabs (Clonazepam) .... One two times a day 5)  Vitamin D3 1000 Unit Caps (Cholecalciferol) .... One by mouth dialy  6)  Hydrocodone-acetaminophen 5-325 Mg Tabs (Hydrocodone-acetaminophen) .Marland Kitchen.. 1-2 by mouth q 6 hours as needed pain 7)  Vimovo 500-20 Mg Tbec (Naproxen-esomeprazole) .... One by mouth two times a day  Patient Instructions: 1)  may discontinue the soft collar 2)  but may not do neck or shoulder exercizes for an additional 4 weeks 3)  may return to basic exercize classes 4)  may not work on any uneven surface, hill or bank!!!!!!! 5)  Please schedule a follow-up appointment in 1 month.

## 2010-06-18 NOTE — Assessment & Plan Note (Signed)
Summary: 4 month up/follow up/cjr   Vital Signs:  Patient profile:   77 year old female Height:      65 inches Weight:      126 pounds BMI:     21.04 Temp:     98.2 degrees F oral Pulse rate:   72 / minute Resp:     14 per minute BP sitting:   110 / 70  (left arm)  Vitals Entered By: Willy Eddy, LPN (January 30, 2010 10:13 AM) CC: roa labs-  Is Patient Diabetic? No   Primary Care Provider:  Stacie Glaze MD  CC:  roa labs- .  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 seeing  Dr Ethelene Hal and he has sent her to [facility] for PT  Preventive Screening-Counseling & Management  Alcohol-Tobacco     Smoking Status: quit     Tobacco Counseling: to remain off tobacco products  Problems Prior to Update: 1)  Cerumen Impaction, Right  (ICD-380.4) 2)  Plantar Wart  (ICD-078.12) 3)  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  (ICD-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.07) 4)  Shoulder Pain, Left  (ICD-719.41) 5)  Osteoarthritis, Hand, Right  (ICD-715.94) 6)  Plantar Wart, Right  (ICD-078.12) 7)  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) 8)  Carotid Bruit  (ICD-785.9) 9)  Osteoarthritis  (ICD-715.90) 10)  Tremor, Essential  (ICD-333.1) 11)  Hemorrhoids, Internal w/o Complication  (ICD-455.0) 12)  Low Back Pain  (ICD-724.2) 13)  Dizziness or Vertigo  (ICD-780.4) 14)  Colonic Polyps  (ICD-211.3) 15)  Hemorrhoids, Internal w/o Complication  (ICD-455.0) 16)  Diverticulosis, Colon  (ICD-562.10) 17)  Hypothyroidism  (ICD-244.9) 18)  Seizure Disorder  (ICD-780.39) 19)  Osteoporosis  (ICD-733.00)  Current Problems (verified): 1)  Cerumen Impaction, Right  (ICD-380.4) 2)  Plantar Wart  (ICD-078.12) 3)  Clos Fx C7  Vertebra w/o Mention Sp Crd Injury  (ICD-805.07) 4)  Shoulder Pain, Left  (ICD-719.41) 5)  Osteoarthritis, Hand, Right  (ICD-715.94) 6)  Plantar Wart, Right  (ICD-078.12) 7)  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 1in The Am and Three At Bedtime 2)  Calcium 500/d 500-125 Mg-Unit  Tabs (Calcium Carbonate-Vitamin D) .... 2 Every Day 3)  Synthroid 100 Mcg  Tabs (Levothyroxine Sodium) .... Once Daily 4)  Clonazepam 0.5 Mg Tabs (Clonazepam) .... One Two Times A Day 5)  Vitamin D3 1000 Unit Caps (Cholecalciferol) .... One By Mouth Dialy 6)  Hydrocodone-Acetaminophen 5-500 Mg Tabs (Hydrocodone-Acetaminophen) .... One By Mouth Q 8 Hour As Needed Pain in Back 7)  Vimovo 500-20 Mg Tbec (Naproxen-Esomeprazole) .... One By Mouth Daily Prn  Current Medications (verified): 1)  Dilantin 100 Mg  Caps (Phenytoin Sodium  Extended) .Marland Kitchen.. 1in The Am and Three At Bedtime 2)  Calcium 500/d 500-125 Mg-Unit  Tabs (Calcium Carbonate-Vitamin D) .... 2 Every Day 3)  Synthroid 100 Mcg  Tabs (Levothyroxine Sodium) .... Once Daily 4)  Clonazepam 0.5 Mg Tabs (Clonazepam) .... One Two Times A Day 5)  Vitamin D3 1000 Unit Caps (Cholecalciferol) .... One By Mouth Dialy 6)  Hydrocodone-Acetaminophen 5-500 Mg Tabs (Hydrocodone-Acetaminophen) .... One By Mouth Q 8 Hour As Needed Pain in Back 7)  Vimovo 500-20 Mg Tbec (Naproxen-Esomeprazole) .... One By Mouth Daily Prn 8)   Nitrofurantoin Macrocrystal 100 Mg Caps (Nitrofurantoin Macrocrystal) .... One By Mouth Two Times A Day For 14 Days  Allergies (verified): No Known Drug Allergies  Past History:  Family History: Last updated: 05-Aug-2008 father and mother died in old age  Social History: Last updated: 02/03/2007 Retired Single Former Smoker  Risk Factors: Smoking Status: quit (01/30/2010)  Past medical, surgical, family and social histories (including risk factors) reviewed, and no changes noted (except as noted below).  Past Medical History: Reviewed history from 05/05/2007 and no changes required. Osteoporosis Seizure disorder DJD 715.90 Hypothyroidism Diverticulosis, colon internal hemorrhoids 455.0 Dizziness or vertigo Low back pain mitral valve thickened Osteoarthritis  Past Surgical History: Reviewed history from 11/22/2007 and no changes required. Total knee replacement x 3 Cervical laminectomy lumbarr injections  for pain bletheroplasty  Family History: Reviewed history from 2008/08/05 and no changes required. father and mother died in old age  Social History: Reviewed history from 02/03/2007 and no changes required. Retired Single Former Smoker  Review of Systems  The patient denies anorexia, fever, weight loss, weight gain, vision loss, decreased hearing, hoarseness, chest pain, syncope, dyspnea on exertion, peripheral edema, prolonged cough, headaches, hemoptysis, abdominal pain, melena, severe indigestion/heartburn, hematuria, incontinence, genital sores, muscle weakness, suspicious skin lesions, transient blindness, difficulty walking, depression, unusual weight change, abnormal bleeding, enlarged lymph nodes, angioedema, and breast masses.         Flu Vaccine Consent Questions     Do you have a history of severe allergic reactions to this vaccine? no    Any prior history of allergic reactions to egg and/or gelatin? no    Do you have a sensitivity to the  preservative Thimersol? no    Do you have a past history of Guillan-Barre Syndrome? no    Do you currently have an acute febrile illness? no    Have you ever had a severe reaction to latex? no    Vaccine information given and explained to patient? yes    Are you currently pregnant? no    Lot Number:[id number]   Exp Date:11/16/2010   Site Given  Left Deltoid IM   Physical Exam  General:  alert and well-developed.   Head:  normocephalic and evidence of trauma.   Eyes:  pupils equal and pupils round.   Ears:  R ear normal and L ear normal.   Nose:  no external deformity and no nasal discharge.   Neck:  nuchal rigidity and decreased ROM.   Lungs:  normal respiratory effort and no wheezes.   Heart:  normal rate and regular rhythm.   Abdomen:  Bowel sounds positive,abdomen soft and non-tender without masses, organomegaly or hernias noted. Msk:  joint tenderness and joint swelling.   Extremities:  No clubbing, cyanosis, edema, or deformity noted with normal full range of motion of all joints.   Neurologic:  alert & oriented X3 and DTRs symmetrical and normal.   Skin:  ecchymosis and petechiae.  Psych:  Oriented X3 and not anxious appearing.     Impression & Recommendations:  Problem # 1:  SEIZURE DISORDER (ICD-780.39) Assessment Unchanged complete driving form for daytime only Her updated medication list for this problem includes:    Dilantin 100 Mg Caps (Phenytoin sodium extended) .Marland Kitchen... 1in the am and three at bedtime    Clonazepam 0.5 Mg Tabs (Clonazepam) ..... One two times a day  Labs Reviewed: Hgb: 14.0 (11/22/2007)   Hct: 40.4 (11/22/2007)   WBC: 4.8 (11/22/2007)   RBC: 4.02 (11/22/2007)   Plts: 250 (11/22/2007) Dilantin: 10.1 (01/22/2010)     Problem # 2:  ACUTE CYSTITIS (ICD-595.0) Assessment: New  with altered MS a UTI was suspected   Encouraged to push clear liquids, get enough rest, and take acetaminophen as needed. To be seen in 10 days if no improvement, sooner  if worse.  Her updated medication list for this problem includes:    Nitrofurantoin Macrocrystal 100 Mg Caps (Nitrofurantoin macrocrystal) ..... One by mouth two times a day for 14 days  Problem # 3:  HYPOTHYROIDISM (ICD-244.9)  Her updated medication list for this problem includes:    Synthroid 100 Mcg Tabs (Levothyroxine sodium) ..... Once daily  Labs Reviewed: TSH: 1.71 (01/22/2010)    Chol: 235 (03/06/2009)   HDL: 108.40 (03/06/2009)     Complete Medication List: 1)  Dilantin 100 Mg Caps (Phenytoin sodium extended) .Marland Kitchen.. 1in the am and three at bedtime 2)  Calcium 500/d 500-125 Mg-unit Tabs (Calcium carbonate-vitamin d) .... 2 every day 3)  Synthroid 100 Mcg Tabs (Levothyroxine sodium) .... Once daily 4)  Clonazepam 0.5 Mg Tabs (Clonazepam) .... One two times a day 5)  Vitamin D3 1000 Unit Caps (Cholecalciferol) .... One by mouth dialy 6)  Hydrocodone-acetaminophen 5-500 Mg Tabs (Hydrocodone-acetaminophen) .... One by mouth q 8 hour as needed pain in back 7)  Vimovo 500-20 Mg Tbec (Naproxen-esomeprazole) .... One by mouth daily prn 8)  Nitrofurantoin Macrocrystal 100 Mg Caps (Nitrofurantoin macrocrystal) .... One by mouth two times a day for 14 days  Other Orders: Flu Vaccine 37yrs + MEDICARE PATIENTS (Z6109) Administration Flu vaccine - MCR (U0454)  Patient Instructions: 1)  Please schedule a follow-up appointment in 3 months. 2)  Take your antibiotic as prescribed until ALL of it is gone, but stop if you develop a rash or swelling and contact our office as soon as possible. Prescriptions: NITROFURANTOIN MACROCRYSTAL 100 MG CAPS (NITROFURANTOIN MACROCRYSTAL) one by mouth two times a day for 14 days  #28 x 0   Entered and Authorized by:   Stacie Glaze MD   Signed by:   Stacie Glaze MD on 01/30/2010   Method used:   Electronically to        [facility]. [phone]* (retail)       [address]       [place], [place]  [address]       Ph:  [phone]       Fax: [phone]   RxID:   [id number]

## 2010-06-18 NOTE — Progress Notes (Signed)
Summary: Pt req instructions for taking samples and when to start.  Phone Note Call from Patient Call back at [facility] Phone [phone]   Caller: Patient Reason for Call: Acute Illness Complaint: Urinary/GYN Problems Summary of Call: Pt called and wants to know the instructions for taking the samples she was given and if she should go ahead and start taken them now. Initial call taken by: Lucy Antigua,  September 04, 2009 8:58 AM  Follow-up for Phone Call        pt informed- vimova two times a day after breakfast and supper Follow-up by: Willy Eddy, LPN,  September 04, 2009 9:08 AM

## 2010-06-18 NOTE — Assessment & Plan Note (Signed)
Summary: little fatigue/njr   Vital Signs:  Patient profile:   77 year old female Height:      65 inches Weight:      124 pounds BMI:     20.71 Temp:     98.2 degrees F oral Pulse rate:   72 / minute Resp:     14 per minute BP sitting:   120 / 70  (left arm)  Vitals Entered By: Willy Eddy, LPN (July 25, 8411 1:46 PM) CC: roa   CC:  roa.  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 1in The Am and Three At Bedtime 2)  Calcium 500/d 500-125 Mg-Unit  Tabs (Calcium Carbonate-Vitamin D) .... 2 Every Day 3)  Synthroid 100 Mcg  Tabs (Levothyroxine Sodium) .... Once Daily 4)  Clonazepam 0.5 Mg Tabs (Clonazepam) .... One Two Times A Day 5)  Vitamin D3 1000 Unit Caps (Cholecalciferol) .... One By Mouth Dialy  Allergies (verified): No Known Drug Allergies  Past History:  Family History: Last updated: 08-24-2008 father and mother died in old age  Social History: Last updated:  02/03/2007 Retired Single Former Smoker  Risk Factors: Smoking Status: quit (07/24/2009)  Past medical, surgical, family and social histories (including risk factors) reviewed, and no changes noted (except as noted below).  Past Medical History: Reviewed history from 05/05/2007 and no changes required. Osteoporosis Seizure disorder DJD 715.90 Hypothyroidism Diverticulosis, colon internal hemorrhoids 455.0 Dizziness or vertigo Low back pain mitral valve thickened Osteoarthritis  Past Surgical History: Reviewed history from 11/22/2007 and no changes required. Total knee replacement x 3 Cervical laminectomy lumbarr injections  for pain bletheroplasty  Family History: Reviewed history from 2008-08-24 and no changes required. father and mother died in old age  Social History: Reviewed history from 02/03/2007 and no changes required. Retired Single Former Smoker  Review of Systems  The patient denies anorexia, fever, weight loss, weight gain, vision loss, decreased hearing, hoarseness, chest pain, syncope, dyspnea on exertion, peripheral edema, prolonged cough, headaches, hemoptysis, abdominal pain, melena, hematochezia, severe indigestion/heartburn, hematuria, incontinence, genital sores, muscle weakness, suspicious skin lesions, transient blindness, difficulty walking, depression, unusual weight change, abnormal bleeding, enlarged lymph nodes, angioedema, and breast masses.    Physical Exam  General:  Well-developed,well-nourished,in no acute distress; alert,appropriate and cooperative throughout examination Head:  normocephalic and atraumatic.   Eyes:  vision grossly intact, pupils equal, and pupils round.   Ears:  R ear normal and L ear normal.   Nose:  no external deformity and no nasal discharge.   Neck:  supple and decreased ROM.   Lungs:  normal respiratory effort and no wheezes.   Heart:  normal rate and regular rhythm.   Abdomen:  soft, non-tender, and normal  bowel sounds.     Impression & Recommendations:  Problem # 1:  PLANTAR WART, RIGHT (ICD-078.12) Assessment Unchanged treat with local debridment  Problem # 2:  TREMOR, ESSENTIAL (ICD-333.1) Assessment: Unchanged stable  Problem # 3:  SEIZURE DISORDER (ICD-780.39) Assessment: Unchanged  stable Her updated medication list for this problem includes:    Dilantin 100 Mg Caps (Phenytoin sodium extended) .Marland Kitchen... 1in the am and three at bedtime    Clonazepam 0.5 Mg Tabs (Clonazepam) ..... One two times a day  Labs Reviewed: Hgb: 14.0 (11/22/2007)   Hct: 40.4 (11/22/2007)   WBC: 4.8 (11/22/2007)   RBC: 4.02 (11/22/2007)   Plts: 250 (11/22/2007) Dilantin: 13.8 (03/06/2009)     Orders: Venipuncture (16109) TLB-Phenytoin (Dilantin) (80185-DILAN)  Problem # 4:  HYPOTHYROIDISM (ICD-244.9) Assessment: Unchanged  Her updated medication list for this problem includes:    Synthroid 100 Mcg Tabs (Levothyroxine sodium) ..... Once daily  Labs Reviewed: TSH: 1.59 (07/28/2008)    Chol: 235 (03/06/2009)   HDL: 108.40 (03/06/2009)     Orders: TLB-TSH (Thyroid Stimulating Hormone) (84443-TSH) TLB-T4 (Thyrox), Free [phone]) TLB-T3, Free (Triiodothyronine) (84481-T3FREE)  Problem # 5:  OSTEOARTHRITIS, HAND, RIGHT (ICD-715.94)  Informed consent obtained and then the joint was prepped in a sterile manor and 40 mg depo and 1/2 cc 1% lidocaine injected into the synovial space. After care discussed. Pt tolerated procedure well.  The following medications were removed from the medication list:    Darvocet-n 100 100-650 Mg Tabs (Propoxyphene n-apap) .Marland Kitchen... As needed every 6 hours  Discussed use of medications, application of heat or cold, and exercises.   Orders: Joint Aspirate / Injection, Small (19147) Depo-Medrol 20mg  (J1020)  Complete Medication List: 1)  Dilantin 100 Mg Caps (Phenytoin sodium extended) .Marland Kitchen.. 1in the am and three at bedtime 2)  Calcium 500/d 500-125 Mg-unit Tabs (Calcium  carbonate-vitamin d) .... 2 every day 3)  Synthroid 100 Mcg Tabs (Levothyroxine sodium) .... Once daily 4)  Clonazepam 0.5 Mg Tabs (Clonazepam) .... One two times a day 5)  Vitamin D3 1000 Unit Caps (Cholecalciferol) .... One by mouth dialy  Patient Instructions: 1)  Please schedule a follow-up appointment in 3 months. 2)  investigate [place]

## 2010-06-20 NOTE — Assessment & Plan Note (Signed)
Summary: 3 month rov/njr   Vital Signs:  Patient profile:   77 year old female Height:      65 inches Weight:      121 pounds BMI:     20.21 Temp:     98.2 degrees F oral Pulse rate:   72 / minute Resp:     14 per minute BP sitting:   110 / 70  (left arm)  Vitals Entered By: Willy Eddy, LPN (April 30, 2010 10:07 AM) CC: roa-pt states she is here today for mammogram? Is Patient Diabetic? No   Primary Care Provider:  Stacie Glaze MD  CC:  roa-pt states she is here today for mammogram?.  History of Present Illness: Pt requested breast exam and mamogram she desired a referral Her pain bain is contrlled and she goes to exercize every monday wednesday and Fridays at [facility]. 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) 8)  Carotid Bruit  (ICD-785.9) 9)  Osteoarthritis  (ICD-715.90) 10)  Tremor, Essential  (ICD-333.1) 11)  Hemorrhoids, Internal w/o Complication  (ICD-455.0) 12)  Low Back Pain  (ICD-724.2) 13)  Dizziness or Vertigo  (ICD-780.4) 14)  Colonic Polyps  (ICD-211.3) 15)  Hemorrhoids, Internal w/o Complication  (ICD-455.0) 16)  Diverticulosis, Colon  (ICD-562.10) 17)  Hypothyroidism  (ICD-244.9) 18)  Seizure Disorder  (ICD-780.39) 19)  Osteoporosis  (ICD-733.00)  Current Problems (verified): 1)  Cerumen Impaction, Right  (ICD-380.4) 2)  Plantar Wart  (ICD-078.12) 3)  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  (ICD-805.07) 4)  Shoulder Pain, Left   (ICD-719.41) 5)  Osteoarthritis, Hand, Right  (ICD-715.94) 6)  Plantar Wart, Right  (ICD-078.12) 7)  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 1in The Am and Three At Bedtime 2)  Calcium 500/d 500-125 Mg-Unit  Tabs (Calcium Carbonate-Vitamin D) .... 2 Every Day 3)  Synthroid 100 Mcg  Tabs (Levothyroxine Sodium) .... Once Daily 4)  Clonazepam 0.5 Mg Tabs (Clonazepam) .... One Two Times A Day 5)  Vitamin D3 1000 Unit Caps (Cholecalciferol) .... One By Mouth Dialy 6)  Hydrocodone-Acetaminophen 5-500 Mg Tabs (Hydrocodone-Acetaminophen) .... One By Mouth Q 8 Hour As Needed Pain in Back 7)  Vimovo 500-20 Mg Tbec (Naproxen-Esomeprazole) .... One By Mouth Daily Prn 8)  Nitrofurantoin Macrocrystal 100 Mg Caps (Nitrofurantoin Macrocrystal) .... One By Mouth Two Times A Day For 14 Days  Current Medications (verified): 1)  Dilantin 100 Mg  Caps (Phenytoin Sodium Extended) .Marland KitchenMarland KitchenMarland Kitchen  1in The Am and Three At Bedtime 2)  Calcium 500/d 500-125 Mg-Unit  Tabs (Calcium Carbonate-Vitamin D) .... 2 Every Day 3)  Synthroid 100 Mcg  Tabs (Levothyroxine Sodium) .... Once Daily 4)  Clonazepam 0.5 Mg Tabs (Clonazepam) .... One Two Times A Day 5)  Vitamin D3 1000 Unit Caps (Cholecalciferol) .... One By Mouth Dialy  Allergies (verified): No Known Drug Allergies  Past History:  Family History: Last updated: August 20, 2008 father and mother died in old age  Social History: Last  updated: 02/03/2007 Retired Single Former Smoker  Risk Factors: Smoking Status: quit (04/30/2010)  Past medical, surgical, family and social histories (including risk factors) reviewed, and no changes noted (except as noted below).  Past Medical History: Reviewed history from 05/05/2007 and no changes required. Osteoporosis Seizure disorder DJD 715.90 Hypothyroidism Diverticulosis, colon internal hemorrhoids 455.0 Dizziness or vertigo Low back pain mitral valve thickened Osteoarthritis  Past Surgical History: Reviewed history from 11/22/2007 and no changes required. Total knee replacement x 3 Cervical laminectomy lumbarr injections  for pain bletheroplasty  Family History: Reviewed history from 08-20-2008 and no changes required. father and mother died in old age  Social History: Reviewed history from 02/03/2007 and no changes required. Retired Single Former Smoker  Review of Systems       The patient complains of decreased hearing and hoarseness.  The patient denies anorexia, fever, weight loss, weight gain, vision loss, chest pain, syncope, dyspnea on exertion, peripheral edema, prolonged cough, headaches, hemoptysis, abdominal pain, melena, hematochezia, severe indigestion/heartburn, hematuria, incontinence, genital sores, muscle weakness, suspicious skin lesions, transient blindness, difficulty walking, depression, unusual weight change, abnormal bleeding, enlarged lymph nodes, angioedema, and breast masses.    Physical Exam  General:  alert and well-developed.   Head:  normocephalic and evidence of trauma.   Eyes:  pupils equal and pupils round.   Ears:  R ear normal and L ear normal.   Nose:  no external deformity and no nasal discharge.   Mouth:  pharynx pink and moist and no erythema.   Neck:  nuchal rigidity and decreased ROM.   Lungs:  normal respiratory effort and no wheezes.   Heart:  normal rate and regular rhythm.   Abdomen:  Bowel sounds  positive,abdomen soft and non-tender without masses, organomegaly or hernias noted. Msk:  joint tenderness and joint swelling.     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 1in the am and three at bedtime    Clonazepam 0.5 Mg Tabs (Clonazepam) ..... One two times a day  Labs Reviewed: Hgb: 14.0 (11/22/2007)   Hct: 40.4 (11/22/2007)   WBC: 4.8 (11/22/2007)   RBC: 4.02 (11/22/2007)   Plts: 250 (11/22/2007) Dilantin: 10.1 (01/22/2010)     Problem # 4:  LOW BACK PAIN (ICD-724.2) stable with exercize The following medications were removed from the medication list:    Hydrocodone-acetaminophen 5-500 Mg Tabs (Hydrocodone-acetaminophen) ..... One by mouth q 8 hour as needed pain in back    Vimovo 500-20 Mg Tbec (Naproxen-esomeprazole) ..... One by mouth daily prn  Discussed use of moist heat or ice, modified activities, medications, and stretching/strengthening exercises. Back care instructions given. To be seen  in 2 weeks if no improvement; sooner if worsening of symptoms.   Problem # 5:  OSTEOPOROSIS (ICD-733.00)  Discussed medication use, applications of heat or ice, and exercises.   Complete Medication List: 1)  Dilantin 100 Mg Caps (Phenytoin sodium extended) .Marland Kitchen.. 1in the am and three at bedtime 2)  Calcium 500/d 500-125 Mg-unit Tabs (Calcium carbonate-vitamin d) .... 2 every day 3)  Synthroid 100 Mcg Tabs (Levothyroxine sodium) .... Once daily 4)  Clonazepam 0.5 Mg Tabs (Clonazepam) .... One two times a day 5)  Vitamin D3 1000 Unit Caps (Cholecalciferol) .... One by mouth dialy  Other  Orders: Radiology Referral (Radiology) .  Patient Instructions: 1)  I agree that [facility]   is a good choice for you 2)  Please schedule a follow-up appointment in 4 months. 3)  continue your exercize! 4)  You will be call with the manogram Prescriptions: CLONAZEPAM 0.5 MG TABS (CLONAZEPAM) one two times a day  #180 x 1   Entered by:   Willy Eddy, LPN   Authorized by:   Stacie Glaze MD   Signed by:   Willy Eddy, LPN on 16/02/9603   Method used:   Print then Give to Patient   RxID:   [id number]    Orders Added: 1)  Est. Patient Level IV [21308] 2)  Radiology Referral [Radiology]

## 2010-07-09 ENCOUNTER — Ambulatory Visit (INDEPENDENT_AMBULATORY_CARE_PROVIDER_SITE_OTHER): Payer: Medicare Other | Admitting: Internal Medicine

## 2010-07-09 DIAGNOSIS — Z01818 Encounter for other preprocedural examination: Secondary | ICD-10-CM

## 2010-07-12 ENCOUNTER — Ambulatory Visit (INDEPENDENT_AMBULATORY_CARE_PROVIDER_SITE_OTHER): Payer: Medicare Other | Admitting: Internal Medicine

## 2010-07-12 ENCOUNTER — Encounter: Payer: Self-pay | Admitting: Internal Medicine

## 2010-07-12 VITALS — BP 110/70 | HR 72 | Temp 98.1°F | Resp 14 | Ht 65.0 in | Wt 122.0 lb

## 2010-07-12 DIAGNOSIS — E039 Hypothyroidism, unspecified: Secondary | ICD-10-CM

## 2010-07-12 DIAGNOSIS — R569 Unspecified convulsions: Secondary | ICD-10-CM

## 2010-07-12 DIAGNOSIS — B070 Plantar wart: Secondary | ICD-10-CM

## 2010-07-12 DIAGNOSIS — M19049 Primary osteoarthritis, unspecified hand: Secondary | ICD-10-CM

## 2010-07-12 LAB — TSH: TSH: 2.84 u[IU]/mL (ref 0.35–5.50)

## 2010-07-12 NOTE — Progress Notes (Signed)
Subjective:    Patient ID: Rebecca [name], female    DOB: Feb 10, 1928, 77 y.o.   MRN: [id number]  HPI  yesterday she presented for her a pre-visit for her pending transfer to [facility].  She had a negative tuberculin skin test. She is due a thyroid test she is also due a measure of her Dilantin these were last checked in September. She sometimes forgets her night pills so her levels may be lower than expected and we will monitor for this but she has had no seizure activity and reports no change in her level of functioning she does note that she has become slightly more forgetful and this is an affirmative reason for living in an assisted living facility [facility] last drip maintaining as much of her independence as possible.   Review of Systems  Constitutional: Positive for activity change. Negative for appetite change and fatigue.  HENT: Negative for ear pain, postnasal drip, tinnitus and ear discharge.   Eyes: Negative.   Respiratory: Negative.   Cardiovascular: Negative.   Gastrointestinal: Negative.   Genitourinary: Positive for frequency.  Musculoskeletal:        She has a plantars wart on her right foot. She has hammertoe deformity of the right foot  Neurological: Negative for seizures and speech difficulty.  Psychiatric/Behavioral: Positive for confusion and decreased concentration.   Past Medical History  Diagnosis Date  . Osteoporosis   . Seizures   . DJD (degenerative joint disease)   . Thyroid disease   . Diverticulosis   . Hemorrhoids   . Dizziness   . Low back pain   . Mitral valve problem thickened    thickened   Past Surgical History  Procedure Date  . Joint replacement   . Cervical laminectomy   . Bletheroplasty     reports that she has quit smoking. She does not have any smokeless tobacco history on file. She reports that she does not drink alcohol or use illicit drugs. family history is not on file.     I have spent more than 30 minutes  examining this patient face-to-face of which over half was spent in counseling     Objective:   Physical Exam  Constitutional: She is oriented to person, place, and time. She appears well-developed and well-nourished. No distress.  HENT:  Head: Normocephalic and atraumatic.  Right Ear: External ear normal.  Left Ear: External ear normal.  Nose: Nose normal.  Mouth/Throat: Oropharynx is clear and moist.  Eyes: Conjunctivae and EOM are normal. Pupils are equal, round, and reactive to light.  Neck: Normal range of motion. Neck supple. No JVD present. No tracheal deviation present. No thyromegaly present.  Cardiovascular: Normal rate, regular rhythm, normal heart sounds and intact distal pulses.   No murmur heard. Pulmonary/Chest: Effort normal and breath sounds normal. She has no wheezes. She exhibits no tenderness.  Abdominal: Soft. Bowel sounds are normal.  Musculoskeletal: Normal range of motion. She exhibits no edema and no tenderness.  Lymphadenopathy:    She has no cervical adenopathy.  Neurological: She is alert and oriented to person, place, and time. She has normal reflexes. No cranial nerve deficit.  Skin: Skin is warm and dry. She is not diaphoretic.        Plantars wart on the bottom of her right foot  Psychiatric: She is withdrawn. Cognition and memory are impaired. She exhibits abnormal recent memory. She is inattentive.          Assessment &  Plan:   patient is an elderly 77 year old white female who presents for followup of multiple medical problems she has increasingly displayed a short-term memory loss and increased confusion forgetfulness showing that she has some organic brain syndrome does not appear to be Alzheimer's type but it does create a risk for her living alone she has plans to [facility] in appropriate location for her we've completed all of her forms orders and given her instructions we reviewed her current problems  including appropriate monitoring of labs for her seizure and for her thyroid

## 2010-07-12 NOTE — Assessment & Plan Note (Signed)
The patient needs her TSH T3 free and T4 free monitor today her weight has been stable.

## 2010-07-12 NOTE — Patient Instructions (Signed)
The patient had a negative tuberculin skin test. She is up-to-date with all of her immunizations including her DPT her Pneumovax and she had last year's flu shot in October

## 2010-07-12 NOTE — Assessment & Plan Note (Signed)
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

## 2010-07-12 NOTE — Assessment & Plan Note (Signed)
The patient has a plantars wart on the bottom of her right foot.  Informed consent was obtained in the lesion was treated for 60 seconds of liquid nitrogen application the patient tolerated the procedure well as procedural care was discussed with the patient and instructions should the lesion reappears contact our office immediately

## 2010-07-12 NOTE — Assessment & Plan Note (Signed)
She has been without seizure activity although she sometimes misses her evening doses we suspect that this will result in a lower than expected level of Dilantin and her serum however we will monitor her Dilantin levels today

## 2010-08-27 ENCOUNTER — Ambulatory Visit: Payer: Self-pay | Admitting: Internal Medicine

## 2010-10-04 NOTE — Op Note (Signed)
NAME:  Rebecca [name], Rebecca [name]                          ACCOUNT NO.:  [id number]   MEDICAL RECORD NO.:  [id number]                   PATIENT TYPE:  INP   LOCATION:  5037                                 FACILITY:  [facility]   PHYSICIAN:  Mark C. Ophelia Charter, M.D.                 DATE OF BIRTH:  [birth year]   DATE OF PROCEDURE:  05/24/2003  DATE OF DISCHARGE:                                 OPERATIVE REPORT   PREOPERATIVE DIAGNOSIS:  Painful loose right total knee arthroplasty.   POSTOPERATIVE DIAGNOSES:  Painful loose right total knee arthroplasty with  complete poly wear and metallosis.   OPERATION PERFORMED:  Right total knee arthroplasty revision.   SURGEON:  Mark C. Ophelia Charter, M.D.   ASSISTANT:  Genene Churn. Denton Meek.   ANESTHESIA:  General orotracheal anesthesia.   ESTIMATED BLOOD LOSS:  Minimal.   TOURNIQUET TIME:  Two hours and 20 minutes x 350, deflated for 20 minutes  and then reinflated for 20 minutes.   DESCRIPTION OF PROCEDURE:  After induction of general anesthesia,  orotracheal intubation, preoperative Ancef prophylaxis, standard prepping  from the tip of the toes to the proximal thigh tourniquet, lateral post and  a heel post were added and standard prepping was used.  Impervious  stockinette, Coban.  Total knee sheets, drapes.  Sterile skin marker.  Betadine Vi-Drape sealing skin was applied.  Leg was then wrapped with an  Esmarch, tourniquet inflated.  Old medial, parapatellar incision was made  following the old scar, cutting through the nonabsorbable sutures.  The old  prosthesis was a Biomet prosthesis and there was severe metallosis  encountered as soon as the knee was opened.  Cultures were obtained.  There  was no purulence but there was dark black, motor oil colored synovial fluid.  Complete synovectomy was performed.  There was no obvious loosening of  either component.  Using flexible osteotomes, the prosthesis bone interface  was opened up.  There was no cement present on  either side.  There was  porous coating on both sides.  Femoral component was finally removed  preserving bone.  Tibial bone was then removed with some difficulty.  Preparation was performed using 5 mm offset fresh cut on the tibia taking 10  mm of bone and then sequential reaming for a #7 Osteonics tibial revision.  An 11 mm stem was selected 80 mm long.  Sequential reaming and broaching was  performed.  On the femoral side cuts were made and #5 size was appropriate,  taking 1 mm of precut bone anteriorly and taking 2 to 3 mm of bone  posteriorly.  Chamfer cuts were made.  Condyles were present for rotational  location.  8 cm stem was used as well.  This was larger at 13 mm and a 15  trial was inserted initially for the jigs for cutting to hold it securely.  Trials were inserted after box cuts  were made and there was excellent tight  fit and a 14 mm spacer gave full extension, good stability with flexion and  extension and a TSL poly was selected.   After pulsatile irrigation, real components were assembled and the offsets  were dialed in, mixed and locked in using a tensioner.  Tourniquet was  deflated to allow reperfusion of the leg.  After 20 minutes, tourniquet was  reinflated, after wrapping the leg with an Esmarch.  Pulsatile lavage was  used.  Synovectomy had been performed.  Bone was meticulously dried.  Tibia  was cemented in first followed by femoral cementing.  Poly was inserted.  Patella was prepared for a #5 with removal of the old poly, new cutting, and  then drilling of the three securing holes and cementing of the patella as  well.  There were identical findings of excellent stability with prosthesis  cemented in, all excess cement had been removed.  Poly was inserted last and  initially a trial was inserted while the patella was cemented on.  A Hemovac  drain was placed.  Closure with 0 Ethibond sutures in the capsule, 2-0  Vicryl subcutaneous tissues, skin staple closure  and postoperative dressing  with knee immobilizer.  Instrument count needle count was correct.                                               Mark C. Ophelia Charter, M.D.    MCY/MEDQ  D:  05/24/2003  T:  05/25/2003  Job:  [id number]

## 2010-10-04 NOTE — Op Note (Signed)
Rebecca [name], Rebecca [name]                ACCOUNT NO.:  [id number]   MEDICAL RECORD NO.:  [id number]          PATIENT TYPE:  INP   LOCATION:  5037                         FACILITY:  [facility]   PHYSICIAN:  Mark C. Ophelia Charter, M.D.    DATE OF BIRTH:  04-Aug-1927   DATE OF PROCEDURE:  DATE OF DISCHARGE:                                 OPERATIVE REPORT   PREOPERATIVE DIAGNOSIS:  Left knee osteoarthritis with varus deformity and  subluxation and flexion contracture.   POSTOPERATIVE DIAGNOSIS:  Left knee osteoarthritis with varus deformity and  subluxation and flexion contracture.   PROCEDURE:  Left total knee arthroplasty, cemented, computer assist.   SURGEON:  Mark C. Ophelia Charter, M.D.   ASSISTANT:  RNFA.   ANESTHESIA:  General plus femoral nerve block preop and postop local in the  incision.   TOURNIQUET TIME:  One hour and 26 minutes.   ESTIMATED BLOOD LOSS:  Minimal.   PROCEDURE IN DETAIL:  After induction of general anesthesia, preoperative  femoral nerve block, proximal thigh tourniquet, leg holder, heel bump, leg  holder was a lateral post centered.  DuraPrep was used, the usual total knee  drape sheets, sterile skin marker,  Betadine Vi-drape with stockinette and  Coban were all applied.  Leg was dressed and an Esmarch tourniquet inflated.  Ancef was given preoperatively.  Midline incision was made and superficial  retinaculum was divided.  Patella was flipped over and cut was made, taken  10 mm of bone from medial lateral patella facet.  With bone removed, the  knee was flexed up and meniscal remnants were removed.  There was severe  erosive arthritis with 1 cm osteophytes all along the medial aspect, deep  running grooves of polysubchondral bone and medial subluxation with varus  deformity and flexion contracture.  A large osteophyte the size of a small  finger digit was removed from the posterior medial corner.  Computer was  used with pins placed, tibial pins, separate outside the  incision and  femoral pins inside the incision and femoral pins inside the incision.  Initiation of the computer was performed and initial cut was made on the  tibia with appropriate resection.  Posterior slope had to be slightly  touched up and then sizing appropriate for a #3.   Femur was fit and when the cutting jig was placed based on computer, there  was a little bit too much bone remaining anteriorly and the pegs were backed  up taking two more mm of bone anteriorly and a little less bone posteriorly  since there was large femoral condyle cuts.  Extra bone was removed  posteriorly with some spurs which helped with the flexion contracture.  When  trials were inserted after box cut, there was full extension and less than 1  degree of varus.  Tibial component did not quite sit completely down  initially and had to be removed and some spurs along the lateral aspect were  removed which then allowed the component to sit down and then the knee came  all the way out straight, but prior to that it still  lacked about 3 degrees  reaching full extension due to the bone that was posterior lateral.   Keel was made, wound was checked under trials.  The keel was a little bit  internally rotated.  It was adjusted and keel cut was placed with excellent  alignment, good correction, normal patella tracking.  Permanent prosthesis  were popped open using the [employer]uy rotating platform with  SmartSet bone cement, cruciate substituting PFC Sigma size 3, tibia size 3,  femur 10 mm, mobile bearing and 35 mm all poly patella.   Excess cement was removed.  All three components were cemented and cement  was hard at 15 minutes.  There was full extension, excellent flexion.  Identical perfect positioning on computer imaging and all pins were removed.  Tourniquet was deflated.  Hemostasis was obtained.  Retinaculum closed with  #1 Tycron, 2-0 Vicryl in the subcutaneous tissues, superficial  retinaculum,  skin staple closure, Marcaine infiltration interior he skin, Xeroform, 4 x  4's, Webril, Ace wrap and knee immobilizer.  Instrument count, needle count  was correct.      Mark C. Ophelia Charter, M.D.  Electronically Signed     MCY/MEDQ  D:  11/26/2005  T:  11/27/2005  Job:  [id number]

## 2010-10-04 NOTE — Discharge Summary (Signed)
NAME:  JYRAH, BLYE                          ACCOUNT NO.:  [id number]   MEDICAL RECORD NO.:  [id number]                   PATIENT TYPE:  INP   LOCATION:  5037                                 FACILITY:  [facility]   PHYSICIAN:  Mark C. Ophelia Charter, M.D.                 DATE OF BIRTH:  [birth year]   DATE OF ADMISSION:  05/24/2003  DATE OF DISCHARGE:  05/30/2003                                 DISCHARGE SUMMARY   ADMISSION DIAGNOSIS:  Right total knee arthroplasty prosthetic loosening.   DISCHARGE DIAGNOSES:  Status post right total knee arthroplasty revision.   SECONDARY DIAGNOSIS:  Hypothyroidism.   PERTINENT HISTORY:  The patient is a 77 year old white female who underwent  a right total knee arthroplasty in 1990. She started to develop chronic pain  and swelling, and progressive worsening symptoms with this knee prosthesis.  X-rays demonstrated loosening under the lateral tibial component. There was  also varus deformity and broken bearings.  The patient was admitted to  proceed with a right total knee arthroplasty revision.   PROCEDURE:  The patient underwent a right total knee arthroplasty revision  on May 24, 2003. She tolerated the procedure well, there were no  complications.  Please see operative note for details.   HOSPITAL COURSE:  The patient was admitted on May 24, 2003 and underwent  procedure as above. On May 25, 2003, INR was 1.2, H&H 7.5 and 21.5.  Due  to her low hemoglobin, the patient was transfused with packed red blood  cells.  She was started on Coumadin 5 mg every 1800.  On May 25, 2003,  the PT order was received, however the R.N. recommended no physical therapy  that day secondary to the patient's medical status. On May 26, 2003, she  was evaluated for possible admission by rehab.  H&H increased on May 26, 2003 to 10.1 and 29.3, INR 1.3.  Coumadin 5 mg daily was continued.  PT  evaluation was completed this day and the patient was out of bed to  chair.  On May 27, 2003, the patient's INR was 1.5, hemoglobin 10.1.   The patient's left knee at this time showed evidence of effusion and was  aspirated, revealing 30 cc of clear fluid. The knee was then injected with  Kenalog.  Patient's INR that day was 1.5, hemoglobin 10.1, hematocrit 29.6.  On May 27, 2003, occupational therapy evaluation was completed.  The  patient walked well in therapy that day and proceeded to walk down the hall.  INR that day 1.8. On May 29, 2003, rehab reevaluated the patient for  possible admission.  Physical therapy was recommended, home health.  Patient's INR this day was 1.9, hemoglobin 9.7, hematocrit 28.7.  Coumadin 5  mg was continued.   After PT evaluation on May 29, 2003, the patient had made excellent  progress and was deemed stable to return home with home health  physical  therapy and home CPM.  The patient was discharged on May 30, 2003 in  stable condition.   DISCHARGE MEDICATIONS:  1. Tylox, 1-2 p.o. q.4-6h. p.r.n.  2. Coumadin 5 mg x24 days.  3. Iron sulfate x1 month.   DISCHARGE INSTRUCTIONS:  The patient will follow up in the office in 1 week.  She will continue home health physical therapy.  Diet regular, activities as  tolerated.      Sandrea Matte, P.A.                       Mark C. Ophelia Charter, M.D.    JH/MEDQ  D:  07/18/2003  T:  07/18/2003  Job:  [id number]

## 2010-10-04 NOTE — Discharge Summary (Signed)
NAMEDILPREET, FAIRES NO.:  [id number]   MEDICAL RECORD NO.:  [id number]            PATIENT TYPE:   LOCATION:                                 FACILITY:   PHYSICIAN:  Mark C. Ophelia Charter, M.D.    DATE OF BIRTH:  [birth year]   DATE OF ADMISSION:  11/26/2005  DATE OF DISCHARGE:  12/01/2005                               DISCHARGE SUMMARY   ADMISSION DIAGNOSIS:  1. Left knee osteoarthritis with varus deformity and subluxation and      flexion contracture.  2. Hypothyroidism.  3. Status post right total knee arthroplasty.  4. Seizure disorder.  5. Arthritis.   DISCHARGE DIAGNOSES:  1. Left knee osteoarthritis with varus deformity and subluxation and      flexion contracture.  2. Hypothyroidism.  3. Status post right total knee arthroplasty.  4. Seizure disorder.  5. Arthritis.  6. Confusion, improving at discharge.  7. Posthemorrhagic anemia.   PROCEDURE:  On November 26, 2005, the patient underwent left total knee  arthroplasty, cemented and computer-assisted, performed by Dr. Ophelia Charter  under general anesthesia.   CONSULTATIONS:  None.   BRIEF HISTORY:  The patient is a 77 year old female with chronic and  progressive left knee pain secondary to left knee osteoarthritis.  Radiographs have shown tricompartmental degenerative changes of the left  knee with subluxation and varus deformity.  On examination she is noted  to have a flexion contracture.  It was felt she would benefit from  surgical intervention and was admitted for the procedure as stated  above.   BRIEF HOSPITAL COURSE:  The patient tolerated the procedure under  general anesthesia.  She also received a femoral nerve block for local  pain control.  Postoperatively, the patient was started on Coumadin for  DVT prophylaxis.  Adjustments in Coumadin dose were made according to  daily pro times by the pharmacist at [facility].  The patient did  develop postoperative confusion.  She had episodes of pulling  out her  Foley catheter as well as her IV.  She also had one fall on November 28, 2005, with a resultant skin tear to the left elbow and no other injury.  The patient was felt to possibly did need nursing home placement  secondary to confusion and her inability to progress with physical  therapy.  Her confusion did eventually begin clearing towards the latter  part of her hospital stay and she was able to ambulate 200 feet with a  rolling walker, being weightbearing as tolerated.  Her family was  involved in the discharge planning and they did arrange for 24-hour  sitters at home and therefore the patient was able to be discharged as  opposed to going to nursing facility.  She was able to progress with  range of motion of the knee as well.  Prior to discharge she was  tolerating the CPM machine to approximately 80 degrees with active range  of motion at 80-90 degrees as well.  She was able to do a straight-leg  raise on the operative site as well.  The patient's  pain was well-  controlled with oral analgesics.  She did have some episodes of elevated  glucose.  Dextrose was deleted from her IV fluids.  Hemoglobin A1c was  5.2.  Postoperative hemoglobin and hematocrit were stable without need  for transfusion.  Other pertinent laboratory values include CBC on  admission with hemoglobin 13.5, hematocrit 39.4.  At discharge,  hemoglobin 9.3, hematocrit 27.3.  INR at discharge 1.5.  Chemistry  studies within normal limits.  Urinalysis on admission with moderate  leukocyte esterase, few bacteria, otherwise normal findings.  Postoperative x-ray of the left knee showed expected appearance after  left total knee arthroplasty.  EKG:  Sinus bradycardia with sinus  arrhythmia, confirmed by Dr. Reyes Ivan.   PLAN:  The patient was discharged to her home with arrangements for  sitters which was arranged by the family.  Also, arrangements for home  health physical therapy, bath aide and home health nurse.   This will be  supplied by [facility].  She will continue to ambulate  weightbearing as tolerated and use her walker for ambulation.  Instructions given for range of motion, strengthening and stretching  exercises of lower extremities.  Dressing change to be done as needed.  She will be allowed to get her wound wet.   MEDICATIONS AT DISCHARGE:  1. Vicodin one every 6 hours as needed for pain.  2. Coumadin 4 mg daily.   The patient will follow up with Dr. Ophelia Charter in 1 week.  All questions  encouraged and answered prior to discharge.      Wende Neighbors, P.A.      Mark C. Ophelia Charter, M.D.  Electronically Signed    SMV/MEDQ  D:  01/01/2007  T:  01/02/2007  Job:  [id number]

## 2010-10-10 ENCOUNTER — Encounter: Payer: Self-pay | Admitting: Internal Medicine

## 2010-10-10 ENCOUNTER — Ambulatory Visit (INDEPENDENT_AMBULATORY_CARE_PROVIDER_SITE_OTHER): Payer: Medicare Other | Admitting: Internal Medicine

## 2010-10-10 VITALS — BP 110/70 | HR 76 | Temp 98.2°F | Resp 16

## 2010-10-10 DIAGNOSIS — R4182 Altered mental status, unspecified: Secondary | ICD-10-CM

## 2010-10-10 DIAGNOSIS — R300 Dysuria: Secondary | ICD-10-CM

## 2010-10-10 DIAGNOSIS — N390 Urinary tract infection, site not specified: Secondary | ICD-10-CM

## 2010-10-10 LAB — POCT URINALYSIS DIPSTICK
Bilirubin, UA: NEGATIVE
Glucose, UA: NEGATIVE
Ketones, UA: NEGATIVE
Spec Grav, UA: 1.02

## 2010-10-10 MED ORDER — LEVOFLOXACIN 500 MG PO TABS: 500.0000 mg | ORAL_TABLET | Freq: Every day | ORAL | Status: AC

## 2010-10-10 MED ORDER — CEFTRIAXONE SODIUM 1 G IJ SOLR
1.0000 g | INTRAMUSCULAR | Status: AC
  Administered 2010-10-10: 1 g via INTRAMUSCULAR

## 2010-10-10 NOTE — Progress Notes (Signed)
  Subjective:    Patient ID: Rebecca [name], female    DOB: Oct 07, 1927, 77 y.o.   MRN: [id number]  HPI  Patient is an 77 year old white female who presents for an office visit.  Prior to her office visit she was seen to be driving erratically and pulled up on the curb twice on her way into her doctor's office.  She states that she did not lose consciousness or have any seizures she also realized that she was driving erratically.  She was oriented to person place time could read a clock face could remember 2 objects at 5 minutes and had appropriate judgment. When asked what she was driving erratically she stated that she was sleep deprived having been up at 3 AM 2 nights in a rope packing at her home to [facility]  We checked a serum blood glucose and it was 101 we obtained a urinalysis which showed a urinary tract infection I suspect that this was a combination of hypoglycemia and a urinary tract infection causing altered mental status she will not be allowed to drive home  Review of Systems  Review of systems was positive for weakness chills otherwise review of systems was negative  Past Medical History  Diagnosis Date  . Osteoporosis   . Seizures   . DJD (degenerative joint disease)   . Thyroid disease   . Diverticulosis   . Hemorrhoids   . Dizziness   . Low back pain   . Mitral valve problem thickened    thickened   Past Surgical History  Procedure Date  . Joint replacement   . Cervical laminectomy   . Bletheroplasty     reports that she has quit smoking. She does not have any smokeless tobacco history on file. She reports that she does not drink alcohol or use illicit drugs. family history is not on file. No Known Allergies     Objective:   Physical Exam    patient was alert oriented to person place and time in no apparent distress she did have some tangential speech but a mean of status examination score was in her normal range.  Her  neck was supple her lung fields were clear heart examination revealed regular rate and rhythm with 2/6 systolic murmur her abdomen was soft her extremity examination revealed trace edema her neuro examination revealed equal grips round reactive to light and accommodation deep tendon reflexes are +2 in the    Assessment & Plan:  Patient had altered mental status due to a combination of hypoglycemia and urinary tract infection.  She'll be treated with a gram of Rocephin for her urinary tract infection and be placed on Levaquin 500 mg daily for 5 additional days.  The prescription will be sent to her proceed for delivery to find some.  A copy of this note will be sent with the patient

## 2010-10-11 ENCOUNTER — Telehealth: Payer: Self-pay | Admitting: *Deleted

## 2010-10-11 NOTE — Telephone Encounter (Signed)
Son called stating that his Mom was slurring her speech, and stumbling when walking.  Advised to call 911 for ER evaluation. She is being treated for the UTI, and this could be the cause, but a CVA or vascular event needs to be ruled out.

## 2010-10-12 LAB — URINE CULTURE: Colony Count: 45000

## 2010-10-18 NOTE — Telephone Encounter (Signed)
Dr. Lovell Sheehan notified.

## 2011-01-13 ENCOUNTER — Ambulatory Visit (INDEPENDENT_AMBULATORY_CARE_PROVIDER_SITE_OTHER): Payer: Medicare Other | Admitting: Internal Medicine

## 2011-01-13 VITALS — BP 110/70 | HR 72 | Temp 98.2°F | Resp 16 | Ht 65.0 in | Wt 127.0 lb

## 2011-01-13 DIAGNOSIS — R569 Unspecified convulsions: Secondary | ICD-10-CM

## 2011-01-13 DIAGNOSIS — R300 Dysuria: Secondary | ICD-10-CM

## 2011-01-13 DIAGNOSIS — F079 Unspecified personality and behavioral disorder due to known physiological condition: Secondary | ICD-10-CM

## 2011-01-13 DIAGNOSIS — N390 Urinary tract infection, site not specified: Secondary | ICD-10-CM

## 2011-01-13 DIAGNOSIS — G250 Essential tremor: Secondary | ICD-10-CM

## 2011-01-13 DIAGNOSIS — E039 Hypothyroidism, unspecified: Secondary | ICD-10-CM

## 2011-01-13 LAB — POCT URINALYSIS DIPSTICK
Blood, UA: NEGATIVE
Nitrite, UA: POSITIVE
Protein, UA: NEGATIVE
pH, UA: 5.5

## 2011-01-13 MED ORDER — NITROFURANTOIN MONOHYD MACRO 100 MG PO CAPS: 100.0000 mg | ORAL_CAPSULE | Freq: Two times a day (BID) | ORAL | Status: AC

## 2011-01-13 MED ORDER — LEVOTHYROXINE SODIUM 100 MCG PO TABS: 100.0000 ug | ORAL_TABLET | Freq: Every day | ORAL | Status: DC

## 2011-01-13 MED ORDER — PHENYTOIN SODIUM EXTENDED 100 MG PO CAPS: ORAL_CAPSULE | ORAL | Status: DC

## 2011-01-13 MED ORDER — CLONAZEPAM 0.5 MG PO TABS: 0.5000 mg | ORAL_TABLET | Freq: Two times a day (BID) | ORAL | Status: DC | PRN

## 2011-01-13 NOTE — Patient Instructions (Signed)
Go to [facility] and pick up the antibiotics

## 2011-01-13 NOTE — Progress Notes (Signed)
  Subjective:    Patient ID: Rebecca [name], female    DOB: Apr 27, 1928, 77 y.o.   MRN: [id number]  HPI patient has a history of OBS hypothyroidism and a seizure disorder who recently had a urinary tract infection that was associated with altered mental status.  She states that she does not recall coming to the office for treatment she was found to have a urinary tract infection and treated with antibiotics since then her mental status has improved.  She is oriented to person place and time, She passes the clock test    Review of Systems  Constitutional: Negative for activity change, appetite change and fatigue.  HENT: Negative for ear pain, congestion, neck pain, postnasal drip and sinus pressure.   Eyes: Negative for redness and visual disturbance.  Respiratory: Negative for cough, shortness of breath and wheezing.   Gastrointestinal: Negative for abdominal pain and abdominal distention.  Genitourinary: Negative for dysuria, frequency and menstrual problem.  Musculoskeletal: Negative for myalgias, joint swelling and arthralgias.  Skin: Negative for rash and wound.  Neurological: Negative for dizziness, weakness and headaches.  Hematological: Negative for adenopathy. Does not bruise/bleed easily.  Psychiatric/Behavioral: Negative for sleep disturbance and decreased concentration.   Past Medical History  Diagnosis Date  . Osteoporosis   . Seizures   . DJD (degenerative joint disease)   . Thyroid disease   . Diverticulosis   . Hemorrhoids   . Dizziness   . Low back pain   . Mitral valve problem thickened    thickened   Past Surgical History  Procedure Date  . Joint replacement   . Cervical laminectomy   . Bletheroplasty     reports that she has quit smoking. She does not have any smokeless tobacco history on file. She reports that she does not drink alcohol or use illicit drugs. family history is not on file. No Known Allergies     Objective:   Physical Exam  Vitals  reviewed. Constitutional: She is oriented to person, place, and time. She appears well-developed and well-nourished. No distress.  HENT:  Head: Normocephalic and atraumatic.  Right Ear: External ear normal.  Left Ear: External ear normal.  Nose: Nose normal.  Mouth/Throat: Oropharynx is clear and moist.  Eyes: Conjunctivae and EOM are normal. Pupils are equal, round, and reactive to light.  Neck: Normal range of motion. Neck supple. No JVD present. No tracheal deviation present. No thyromegaly present.  Cardiovascular: Normal rate, regular rhythm, normal heart sounds and intact distal pulses.   No murmur heard. Pulmonary/Chest: Effort normal and breath sounds normal. She has no wheezes. She exhibits no tenderness.  Abdominal: Soft. Bowel sounds are normal.  Musculoskeletal: Normal range of motion. She exhibits no edema and no tenderness.  Lymphadenopathy:    She has no cervical adenopathy.  Neurological: She is alert and oriented to person, place, and time. She has normal reflexes. No cranial nerve deficit.  Skin: Skin is warm and dry. She is not diaphoretic.  Psychiatric: She has a normal mood and affect. Her behavior is normal.          Assessment & Plan:  Her dysuria  Has resolved, her mental statis has improved but still have urinary frequency The urine is still positive for nitrates We will re-culture the urine Due to recurrent UTI I believe she will need a prolonged course of 14 days

## 2011-01-16 ENCOUNTER — Encounter: Payer: Self-pay | Admitting: Internal Medicine

## 2011-01-18 DIAGNOSIS — F411 Generalized anxiety disorder: Secondary | ICD-10-CM | POA: Insufficient documentation

## 2011-01-18 DIAGNOSIS — F070 Personality change due to known physiological condition: Secondary | ICD-10-CM

## 2011-01-18 DIAGNOSIS — E559 Vitamin D deficiency, unspecified: Secondary | ICD-10-CM

## 2011-01-18 DIAGNOSIS — E506 Vitamin A deficiency with xerophthalmic scars of cornea: Secondary | ICD-10-CM

## 2011-01-18 DIAGNOSIS — E039 Hypothyroidism, unspecified: Secondary | ICD-10-CM

## 2011-01-18 HISTORY — DX: Hypothyroidism, unspecified: E03.9

## 2011-01-18 HISTORY — DX: Personality change due to known physiological condition: F07.0

## 2011-01-18 HISTORY — DX: Generalized anxiety disorder: F41.1

## 2011-01-18 HISTORY — DX: Vitamin A deficiency with xerophthalmic scars of cornea: E50.6

## 2011-01-18 HISTORY — DX: Vitamin D deficiency, unspecified: E55.9

## 2011-01-22 ENCOUNTER — Emergency Department (HOSPITAL_COMMUNITY): Payer: Medicare Other

## 2011-01-22 ENCOUNTER — Telehealth: Payer: Self-pay | Admitting: *Deleted

## 2011-01-22 ENCOUNTER — Inpatient Hospital Stay (HOSPITAL_COMMUNITY)
Admission: EM | Admit: 2011-01-22 | Discharge: 2011-01-31 | DRG: 193 | Disposition: A | Discharge status: Nursing Home (Includes ICF) | Payer: Medicare Other | Attending: Internal Medicine | Admitting: Internal Medicine

## 2011-01-22 DIAGNOSIS — M129 Arthropathy, unspecified: Secondary | ICD-10-CM | POA: Diagnosis present

## 2011-01-22 DIAGNOSIS — J9600 Acute respiratory failure, unspecified whether with hypoxia or hypercapnia: Secondary | ICD-10-CM | POA: Diagnosis present

## 2011-01-22 DIAGNOSIS — D72829 Elevated white blood cell count, unspecified: Secondary | ICD-10-CM | POA: Diagnosis present

## 2011-01-22 DIAGNOSIS — E860 Dehydration: Secondary | ICD-10-CM | POA: Diagnosis present

## 2011-01-22 DIAGNOSIS — G929 Unspecified toxic encephalopathy: Secondary | ICD-10-CM | POA: Diagnosis present

## 2011-01-22 DIAGNOSIS — G40909 Epilepsy, unspecified, not intractable, without status epilepticus: Secondary | ICD-10-CM | POA: Diagnosis present

## 2011-01-22 DIAGNOSIS — B370 Candidal stomatitis: Secondary | ICD-10-CM | POA: Diagnosis present

## 2011-01-22 DIAGNOSIS — Z96659 Presence of unspecified artificial knee joint: Secondary | ICD-10-CM

## 2011-01-22 DIAGNOSIS — T3795XA Adverse effect of unspecified systemic anti-infective and antiparasitic, initial encounter: Secondary | ICD-10-CM | POA: Diagnosis present

## 2011-01-22 DIAGNOSIS — G92 Toxic encephalopathy: Secondary | ICD-10-CM | POA: Diagnosis present

## 2011-01-22 DIAGNOSIS — E039 Hypothyroidism, unspecified: Secondary | ICD-10-CM | POA: Diagnosis present

## 2011-01-22 DIAGNOSIS — D638 Anemia in other chronic diseases classified elsewhere: Secondary | ICD-10-CM | POA: Diagnosis present

## 2011-01-22 DIAGNOSIS — Z79899 Other long term (current) drug therapy: Secondary | ICD-10-CM

## 2011-01-22 DIAGNOSIS — R7309 Other abnormal glucose: Secondary | ICD-10-CM | POA: Diagnosis present

## 2011-01-22 DIAGNOSIS — Z87891 Personal history of nicotine dependence: Secondary | ICD-10-CM

## 2011-01-22 DIAGNOSIS — L270 Generalized skin eruption due to drugs and medicaments taken internally: Secondary | ICD-10-CM | POA: Diagnosis not present

## 2011-01-22 DIAGNOSIS — J189 Pneumonia, unspecified organism: Principal | ICD-10-CM | POA: Diagnosis present

## 2011-01-22 DIAGNOSIS — Z23 Encounter for immunization: Secondary | ICD-10-CM

## 2011-01-22 DIAGNOSIS — K5900 Constipation, unspecified: Secondary | ICD-10-CM | POA: Diagnosis not present

## 2011-01-22 DIAGNOSIS — Y92009 Unspecified place in unspecified non-institutional (private) residence as the place of occurrence of the external cause: Secondary | ICD-10-CM

## 2011-01-22 DIAGNOSIS — Z66 Do not resuscitate: Secondary | ICD-10-CM | POA: Diagnosis present

## 2011-01-22 LAB — COMPREHENSIVE METABOLIC PANEL
ALT: 16 U/L (ref 0–35)
Albumin: 3.1 g/dL — ABNORMAL LOW (ref 3.5–5.2)
Alkaline Phosphatase: 91 U/L (ref 39–117)
BUN: 19 mg/dL (ref 6–23)
Chloride: 99 mEq/L (ref 96–112)
GFR calc Af Amer: >60 mL/min (ref >60)
Glucose, Bld: 123 mg/dL — ABNORMAL HIGH (ref 70–99)
Potassium: 4.2 mEq/L (ref 3.5–5.1)
Sodium: 137 mEq/L (ref 135–145)
Total Bilirubin: 0.3 mg/dL (ref 0.3–1.2)

## 2011-01-22 LAB — URINALYSIS, ROUTINE W REFLEX MICROSCOPIC
Bilirubin Urine: NEGATIVE
Glucose, UA: NEGATIVE mg/dL
Ketones, ur: NEGATIVE mg/dL
Leukocytes, UA: NEGATIVE
Protein, ur: NEGATIVE mg/dL

## 2011-01-22 LAB — DIFFERENTIAL
Basophils Absolute: 0 10*3/uL (ref 0.0–0.1)
Lymphocytes Relative: 3 % — ABNORMAL LOW (ref 12–46)
Neutro Abs: 18.4 10*3/uL — ABNORMAL HIGH (ref 1.7–7.7)
Neutrophils Relative %: 91 % — ABNORMAL HIGH (ref 43–77)

## 2011-01-22 LAB — CBC
HCT: 38.1 % (ref 36.0–46.0)
Hemoglobin: 12.7 g/dL (ref 12.0–15.0)
WBC: 20.3 10*3/uL — ABNORMAL HIGH (ref 4.0–10.5)

## 2011-01-22 LAB — LACTIC ACID, PLASMA: Lactic Acid, Venous: 1.9 mmol/L (ref 0.5–2.2)

## 2011-01-22 NOTE — Telephone Encounter (Signed)
[name] calls stating his Mom is not responding to UTI treatment.  Is dehydrated, confused, not eating and just not doing well.  Advised ER visit ASAP for possible hydration and admission with IV antibiotics.

## 2011-01-23 LAB — CLOSTRIDIUM DIFFICILE BY PCR: Toxigenic C. Difficile by PCR: NEGATIVE

## 2011-01-23 LAB — CBC
MCH: 32.5 pg (ref 26.0–34.0)
MCHC: 33.1 g/dL (ref 30.0–36.0)
Platelets: 264 10*3/uL (ref 150–400)
RDW: 12.5 % (ref 11.5–15.5)

## 2011-01-23 LAB — BASIC METABOLIC PANEL
CO2: 26 mEq/L (ref 19–32)
Calcium: 8.6 mg/dL (ref 8.4–10.5)
Creatinine, Ser: 0.49 mg/dL — ABNORMAL LOW (ref 0.50–1.10)
Glucose, Bld: 128 mg/dL — ABNORMAL HIGH (ref 70–99)

## 2011-01-23 LAB — URINE CULTURE: Culture: NO GROWTH

## 2011-01-23 LAB — DIFFERENTIAL
Basophils Relative: 0 % (ref 0–1)
Eosinophils Absolute: 0.4 10*3/uL (ref 0.0–0.7)
Monocytes Absolute: 0.8 10*3/uL (ref 0.1–1.0)
Monocytes Relative: 5 % (ref 3–12)

## 2011-01-23 NOTE — Telephone Encounter (Signed)
Called to check on pt and she was admitted to [facility] yesterday with pneumonia, but doing fairly well.

## 2011-01-24 LAB — CBC
HCT: 31.9 % — ABNORMAL LOW (ref 36.0–46.0)
Hemoglobin: 10.8 g/dL — ABNORMAL LOW (ref 12.0–15.0)
MCHC: 33.9 g/dL (ref 30.0–36.0)

## 2011-01-24 LAB — COMPREHENSIVE METABOLIC PANEL
Alkaline Phosphatase: 65 U/L (ref 39–117)
BUN: 8 mg/dL (ref 6–23)
CO2: 24 mEq/L (ref 19–32)
Glucose, Bld: 143 mg/dL — ABNORMAL HIGH (ref 70–99)
Potassium: 3.6 mEq/L (ref 3.5–5.1)
Total Bilirubin: 0.2 mg/dL — ABNORMAL LOW (ref 0.3–1.2)
Total Protein: 6.1 g/dL (ref 6.0–8.3)

## 2011-01-24 LAB — DIFFERENTIAL
Basophils Absolute: 0 10*3/uL (ref 0.0–0.1)
Lymphocytes Relative: 8 % — ABNORMAL LOW (ref 12–46)
Monocytes Absolute: 0.6 10*3/uL (ref 0.1–1.0)
Monocytes Relative: 5 % (ref 3–12)
Neutro Abs: 9.2 10*3/uL — ABNORMAL HIGH (ref 1.7–7.7)

## 2011-01-24 LAB — MAGNESIUM: Magnesium: 2.1 mg/dL (ref 1.5–2.5)

## 2011-01-25 LAB — BASIC METABOLIC PANEL
Calcium: 8.9 mg/dL (ref 8.4–10.5)
Creatinine, Ser: 0.47 mg/dL — ABNORMAL LOW (ref 0.50–1.10)
GFR calc non Af Amer: >60 mL/min (ref >60)
Sodium: 138 mEq/L (ref 135–145)

## 2011-01-25 LAB — PRO B NATRIURETIC PEPTIDE: Pro B Natriuretic peptide (BNP): 1176 pg/mL — ABNORMAL HIGH (ref 0–450)

## 2011-01-25 LAB — VANCOMYCIN, TROUGH: Vancomycin Tr: 9.4 ug/mL — ABNORMAL LOW (ref 10.0–20.0)

## 2011-01-25 LAB — CBC
MCH: 32.8 pg (ref 26.0–34.0)
MCHC: 33.4 g/dL (ref 30.0–36.0)
Platelets: 330 10*3/uL (ref 150–400)
RDW: 12.7 % (ref 11.5–15.5)

## 2011-01-26 ENCOUNTER — Inpatient Hospital Stay (HOSPITAL_COMMUNITY): Payer: Medicare Other

## 2011-01-26 LAB — CBC
HCT: 31.3 % — ABNORMAL LOW (ref 36.0–46.0)
Hemoglobin: 10.4 g/dL — ABNORMAL LOW (ref 12.0–15.0)
MCHC: 33.2 g/dL (ref 30.0–36.0)
MCV: 98.7 fL (ref 78.0–100.0)
RDW: 12.6 % (ref 11.5–15.5)

## 2011-01-27 DIAGNOSIS — J9600 Acute respiratory failure, unspecified whether with hypoxia or hypercapnia: Secondary | ICD-10-CM

## 2011-01-27 DIAGNOSIS — J8410 Pulmonary fibrosis, unspecified: Secondary | ICD-10-CM

## 2011-01-27 LAB — CBC
HCT: 33.6 % — ABNORMAL LOW (ref 36.0–46.0)
Hemoglobin: 11 g/dL — ABNORMAL LOW (ref 12.0–15.0)
MCH: 32.6 pg (ref 26.0–34.0)
MCHC: 32.7 g/dL (ref 30.0–36.0)
RDW: 12.7 % (ref 11.5–15.5)

## 2011-01-27 LAB — DIFFERENTIAL
Basophils Absolute: 0 10*3/uL (ref 0.0–0.1)
Basophils Relative: 0 % (ref 0–1)
Eosinophils Relative: 6 % — ABNORMAL HIGH (ref 0–5)
Lymphocytes Relative: 12 % (ref 12–46)
Monocytes Absolute: 0.6 10*3/uL (ref 0.1–1.0)
Monocytes Relative: 7 % (ref 3–12)

## 2011-01-27 LAB — BASIC METABOLIC PANEL
BUN: 6 mg/dL (ref 6–23)
Calcium: 9.9 mg/dL (ref 8.4–10.5)
GFR calc Af Amer: >60 mL/min (ref >60)
GFR calc non Af Amer: >60 mL/min (ref >60)
Glucose, Bld: 99 mg/dL (ref 70–99)
Potassium: 4 mEq/L (ref 3.5–5.1)

## 2011-01-27 LAB — IRON AND TIBC: Saturation Ratios: 26 % (ref 20–55)

## 2011-01-28 ENCOUNTER — Inpatient Hospital Stay (HOSPITAL_COMMUNITY): Payer: Medicare Other

## 2011-01-28 DIAGNOSIS — J9600 Acute respiratory failure, unspecified whether with hypoxia or hypercapnia: Secondary | ICD-10-CM

## 2011-01-28 DIAGNOSIS — J8410 Pulmonary fibrosis, unspecified: Secondary | ICD-10-CM

## 2011-01-28 LAB — BASIC METABOLIC PANEL
BUN: 6 mg/dL (ref 6–23)
CO2: 28 mEq/L (ref 19–32)
GFR calc non Af Amer: >60 mL/min (ref >60)
Glucose, Bld: 102 mg/dL — ABNORMAL HIGH (ref 70–99)
Potassium: 4 mEq/L (ref 3.5–5.1)

## 2011-01-28 LAB — DIFFERENTIAL
Lymphocytes Relative: 14 % (ref 12–46)
Monocytes Absolute: 0.6 10*3/uL (ref 0.1–1.0)
Monocytes Relative: 8 % (ref 3–12)
Neutro Abs: 5.4 10*3/uL (ref 1.7–7.7)

## 2011-01-28 LAB — CBC
HCT: 34.4 % — ABNORMAL LOW (ref 36.0–46.0)
Hemoglobin: 11.4 g/dL — ABNORMAL LOW (ref 12.0–15.0)
MCH: 32.7 pg (ref 26.0–34.0)
MCHC: 33.1 g/dL (ref 30.0–36.0)

## 2011-01-28 LAB — CULTURE, BLOOD (ROUTINE X 2)
Culture  Setup Time: 201209052357
Culture: NO GROWTH

## 2011-01-29 LAB — CBC
HCT: 34.4 % — ABNORMAL LOW (ref 36.0–46.0)
Hemoglobin: 11 g/dL — ABNORMAL LOW (ref 12.0–15.0)
MCH: 31.7 pg (ref 26.0–34.0)
MCV: 99.1 fL (ref 78.0–100.0)
Platelets: 396 10*3/uL (ref 150–400)
RBC: 3.47 MIL/uL — ABNORMAL LOW (ref 3.87–5.11)

## 2011-01-29 LAB — DIFFERENTIAL
Eosinophils Absolute: 0 10*3/uL (ref 0.0–0.7)
Lymphocytes Relative: 15 % (ref 12–46)
Lymphs Abs: 1.2 10*3/uL (ref 0.7–4.0)
Monocytes Relative: 8 % (ref 3–12)
Neutrophils Relative %: 77 % (ref 43–77)

## 2011-01-29 LAB — BASIC METABOLIC PANEL
BUN: 9 mg/dL (ref 6–23)
CO2: 30 mEq/L (ref 19–32)
Calcium: 10 mg/dL (ref 8.4–10.5)
Creatinine, Ser: <0.47 mg/dL — ABNORMAL LOW (ref 0.50–1.10)
Glucose, Bld: 107 mg/dL — ABNORMAL HIGH (ref 70–99)

## 2011-01-31 ENCOUNTER — Telehealth: Payer: Self-pay | Admitting: Internal Medicine

## 2011-01-31 NOTE — Telephone Encounter (Signed)
Pt being discharge from [facility] to [facility]. Can not admin any meds to pt until they get doctors approval. Faxing the Discharge Summary and FL-2 form for doctors signature to approve meds. Pls fax back asap today.

## 2011-01-31 NOTE — Telephone Encounter (Signed)
Received discharge summary-listed meds with verification faxed back aS REQUESTED

## 2011-02-17 DIAGNOSIS — K5790 Diverticulosis of intestine, part unspecified, without perforation or abscess without bleeding: Secondary | ICD-10-CM

## 2011-02-17 HISTORY — DX: Diverticulosis of intestine, part unspecified, without perforation or abscess without bleeding: K57.90

## 2011-02-17 NOTE — Discharge Summary (Signed)
Rebecca [name], Rebecca [name] NO.:  [id number]  MEDICAL RECORD NO.:  [id number]  LOCATION:  1434                         FACILITY:  [facility]  PHYSICIAN:  Thad Ranger, MD       DATE OF BIRTH:  03/08/1928  DATE OF ADMISSION:  01/22/2011 DATE OF DISCHARGE:                              DISCHARGE SUMMARY   PRIMARY CARE PHYSICIAN:  Stacie Glaze, MD  DISCHARGE DIAGNOSES: 1. Fever with leukocytosis and interstitial infiltrates, likely     secondary to pneumonitis. 2. Macrodantin toxicity. 3. Rashes secondary to Macrodantin toxicity, significantly improved. 4. Seizure disorder. 5. Hypothyroidism. 6. Arthritis. 7. Acute hypoxic respiratory failure, significantly improved.  The     patient was admitted for the further workup.  CONSULTATION:  Pulmonology, Casimiro Needle B. Sherene Sires, MD, FCCP  DISCHARGE MEDICATIONS: 1. Albuterol nebs 2.5 mg inhaled t.i.d. and q.4 h. p.r.n. for     shortness of breath or wheezing. 2. Atrovent 0.5 mg inhaled t.i.d. 3. Levaquin 750 mg p.o. daily for 5 days. 4. Miconazole powder 1 application topically t.i.d. on the back rash. 5. Nystatin 10 mL p.o. t.i.d. for 10 days. 6. Prednisone 20 mg for 4 days, then taper to 10 mg for 4 days, and 5     mg for 4 days, then off. 7. Tylenol 500 mg p.o. every 4 hours as needed for pain. 8. Calcium carbonate/vitamin D 1 tablet p.o. b.i.d. 9. Clonazepam 0.5 mg p.o. b.i.d. 10.Dilantin extended release 100 mg 1-3 capsules b.i.d. 1 capsule in     the morning, 3 at bedtime. 11.Hydrocodone/APAP 5/325 mg p.o. every 6 hours as needed for pain. 12.Levothyroxine 100 mcg p.o. q.a.m. 13.Vitamin D3 5000 units daily.  HISTORY OF PRESENT ILLNESS:  At the time of admission, Rebecca [name] is an 77 year old female with a history of seizure disorder, knee placements bilaterally, and hypothyroidism who presented with disorientation, unsteadiness, and febrile with leukocytosis.  The patient's son at the time of admission had reported  that a week ago, she was prescribed a course of nitrofurantoin for presumed UTI.  Subsequently, the patient was disoriented at her independent living facility and she was noted to have low blood pressure and bit unsteady.  On the day of admission, she was more disoriented, walking around in the hall in her pajamas, and had a low-grade fever.  In the emergency room, the patient was noted to be 104.9 rectally with a blood pressure of 118/55, pulse rate of 93, and she was noted to be 81% on room air on the oxygen saturation.  The patient's white count to be noted was 20,000.  1. Acute hypoxic respiratory failure with fever and leukocytosis and     pneumonitis.  Given that the patient was on antibiotics prior to     admission, she was placed on treatment for the healthcare-     associated pneumonia.  Pulmonology consult was also obtained and     felt that the patient's symptoms were most likely from the     Macrodantin toxicity.  A chest x-ray on September 9th had shown     persistence and worsening of patchy pulmonary infiltrates,     consistent with pneumonia.  The patient was placed on prednisone,     flutter valve with incentive spirometry.  She was placed on     Levaquin per Pulmonology recommendation.  She does require oxygen     with home O2 evaluation.  The patient's oxygen saturations were 89%     on 2 L and improved to 91%.  The patient was also noticed to have     the rash, likely secondary from the Macrodantin toxicity which has     significantly improved with the steroid she used and miconazole     powder.  The patient will require oxygen, prednisone, and nebulizer     treatments at the facility.  The patient will also require physical     therapy for strengthening. 2. Hypothyroidism, remained stable.  The patient was continued on     levothyroxine. 3. History of seizures, remained stable.  The patient was continued on     Dilantin per her home dose.  PHYSICAL EXAMINATION:   VITAL SIGNS:  At the time of the discharge, temperature 98.1, pulse 74, respirations 18, blood pressure 95/59, O2 saturations 94% on 2 L. GENERAL:  The patient is alert and awake, not in any acute distress. HEENT:  Anicteric sclerae.  Pink conjunctivae.  Pupils reactive to light and accommodation.  EOMI. NECK:  Supple.  No lymphadenopathy. CVS:  S1 and S2 clear. CHEST:  Decreased breath sounds at the bases, otherwise fairly clear. ABDOMEN:  Soft, nontender, nondistended. EXTREMITIES:  No cyanosis or clubbing or edema noted. NEURO:  Alert and oriented and conversant.  Discharge follow up with Dr. Stacie Glaze within next 7-10 days. Please note that the patient had a PPD placed on January 30, 2011, at 1700.  The patient needs to have the PPD read at the facility on February 01, 2011, at 1700.  Discharge time 45 minutes.     Thad Ranger, MD     RR/MEDQ  D:  01/31/2011  T:  01/31/2011  Job:  [id number]  cc:   Charlaine Dalton. Sherene Sires, MD, FCCP [address] [place] [address]  Stacie Glaze, MD [address] [place] [address]  Electronically Signed by Andres Labrum Slater Mcmanaman  on 02/17/2011 03:00:24 PM

## 2011-02-19 NOTE — H&P (Signed)
Rebecca [name], Rebecca NO.:  [name]  MEDICAL RECORD NO.:  [id number]  LOCATION:  [facility]LED                         FACILITY:  [facility]  PHYSICIAN:  Carlota Raspberry, MD         DATE OF BIRTH:  1928-04-02  DATE OF ADMISSION:  01/22/2011 DATE OF DISCHARGE:                             HISTORY & PHYSICAL   PRIMARY CARE PHYSICIAN:  Stacie Glaze, MD  CHIEF COMPLAINT:  Disorientation, weakness, and unsteadiness.  HISTORY OF PRESENT ILLNESS:  This is an 77 year old female with a history of seizure disorder, knee replacements bilaterally, and hypothyroidism, but overall fairly healthy living in an independent living home and still quite active who presents with disorientation, unsteadiness, and found to be febrile with leukocytosis.  The patient's son reports that 1 week ago, on Monday, she was prescribed a course of nitrofurantoin for presumed UTI by Dr. Lovell Sheehan.  This was done based on labs and not for any symptoms as the patient denied any dysuria or any acute symptoms prompting this evaluation, but rather it sounds like UA was done as a followup to UTI that was diagnosed in May of this year.  Nevertheless, the patient's son reports that this Monday, the patient was a bit disoriented at her independent living facility and some of the other inhabits hence brought the patient to the nursing station where she was noted to have a low blood pressure and to be a bit unsteady. They thought that she had some dehydration and they encouraged some oral fluids and gave her a sandwich and she fell better through the day.  However, today she was noted to be a bit more disoriented, walking around in the hall in her pajamas and her son took her temperature and noted a low-grade fever, but unclear what the exact number was.  Her PCP, Dr. Lovell Sheehan, was called and recommended coming into the emergency room.  In the ED, she was initially 104.9 rectally, blood pressure was 118/55, her  pulse was 93, but she was noted be 81% on room air, which improved to 96% on 4 L nasal cannula.  The rest of her lab work was significant for a white blood cell count of 20,000.  Her chemistry was normal including a BUN and creatinine of 19 and 0.6.  Chest x-ray was done, which showed course diffuse interstitial opacities called as progressive interstitial lung disease versus mild diffuse edema or acute interstitial infiltrates.  Therefore, she was started on treatment for HCAP with Avelox, vancomycin, and cefepime.  She was also given a 500 cc bolus and her mentation started improving and her temperature improved to 100.0.  With mentation, she was able to tell where she was, the day of the week, the year.  REVIEW OF SYSTEMS:  As above, otherwise positive for coughing for the past 2 days.  She does endorse some diarrhea about 2 weeks ago, but has not had any diarrhea recently and no diarrhea since having initiated the nitrofurantoin.  She also endorses pain in her left buttock that is worse when she gets out of bed and improves with a little bit of Tylenol and does not hinder her through the  rest of the day such that she is able to continue exercising and doing her ADLs including driving a car. She has had some recently decreased appetite, but denies any shortness of breath, chest pain, nausea, vomiting, abdominal pain, or dysuria. She denies any rash or any other skin changes.  Currently, her son states that her mental status is back to its baseline.  PAST MEDICAL HISTORY: 1. Status post bilateral knee arthroplasties. 2. Hypothyroidism, on replacement therapy. 3. Seizure disorder, on Dilantin. 4. Arthritis.  MEDICATIONS: 1. Clonazepam 0.5 b.i.d. 2. Calcium carbonate/vitamin D twice daily. 3. Acetaminophen 500 q.4 p.r.n. 4. Vitamin D3 5000 units daily. 5. Hydrocodone/APAP 5/325 mg q.6 p.r.n. 6. Levothyroxine 100 mcg every morning. 7. Dilantin (phenytoin extended release) 100  mg p.o. 1 capsule in the     morning and 3 capsules at bedtime.  ALLERGIES:  She has a FISH OIL allergy.  SOCIAL HISTORY:  She lives at the [facility] and her son reports that she is still very active lady driving a car and doing exercises frequently.  She has a son and is a nonsmoker, nondrug abuser, and no alcohol.  FAMILY HISTORY:  Noncontributory.  PHYSICAL EXAMINATION:  VITAL SIGNS:  Blood pressure 115/79, pulse 73, 96% on 2 L nasal cannula, most recent temperature is 100.0. GENERAL:  She is a pleasant elderly average-sized appearing lady in the ED stretcher.  She is pleasant, able to answer questions and is conversant, but is a bit slow in mentation.  She does not appear as ill as she sounded previously. HEENT:  Her pupils are equal, round, and reactive to light.  Her conjunctivae are bit injected, but are anicteric.  Her extraocular muscles are intact.  Her mouth is very dry appearing, but there are no gross lesions noted. LUNGS:  With pan-inspiratory crackles noted bilaterally at the bases and going up to the midlung field.  They sounds like "dry" crackles,  but I do not hear any wheezes or other gross rhonchi.  Her air movement is actually quite good. HEART:  Regular rate and rhythm with no murmurs or gallops appreciated. ABDOMEN:  Soft, nontender, nondistended.  It is benign. EXTREMITIES:  Warm and well perfused with age-related arthritic type changes.  There is no cyanosis or clubbing appreciated.  She has no bilateral lower extremity edema.  Her bilateral knees have prominent surgical scars. SKIN:  Warm without diaphoresis or other appreciable rashes. NEURO:  She is alert, oriented, and conversant, but her conversation is a bit slow and she hesitates on several questions and defers to her son. She is spontaneously moving all extremities and is able to sit up in the bed without difficulty.  Overall, there are no gross focal  neurological deficits noted.  LABORATORY DATA:  Her white blood cell count is 20,000 with 91% neutrophils, but no bands noted; hematocrit is 38.1; platelet count 266. Her chemistry panel was completely normal including a BUN and creatinine of 19 and 0.66.  Her albumin is 3.1, otherwise LFTs are normal.  Her venous lactate is 1.9.  Her UA is completely negative.  Her urine culture and 2 blood cultures are currently pending.  RADIOLOGICAL STUDIES:  She had a chest x-ray today showing course diffuse interstitial opacities with considerations including progressive interstitial lung disease versus mild diffuse edema or acute interstitial infiltrates.  IMPRESSION:  This is an 77 year old female who is overall fairly healthy, but has a history of a seizure disorder and hypothyroidism who presents with fever  and leukocytosis. 1. Fevers and leukocytosis.  Infectious sources at this point include     a chest x-ray that shows interstitial infiltrates, but without any     gross lobar or focal opacity given that she does have a cough,     pneumonia is a reasonable consideration.  Her UA was clean and she     was currently being treated with a course of nitrofurantoin making     the urinary tract infection less likely.  However, given this     recent antibiotics use and a fairly high white count, Clostridium     difficile is a consideration, which we should test for, however,     the lack of diarrhea or abdominal pain or bowel changes makes this     a bit less likely.  Outside of these sources nothing else on her review of systems points to any other infectious source.  I think treatment broadly at this point given that her high white count of fever and dwelling in a community setting is reasonable.  However, I will scale back on the cefepime and Avelox to ceftriaxone and continue with vancomycin. 1. History of seizure disorder.  We will continue her on her home     Dilantin. 2.  Hyperthyroidism.  We will continue her home levothyroxine. 3. Medication reconciliation.  We will continue her home clonazepam,     calcium, and vitamin D. 4. Prophylaxis.  We will give her subcutaneous heparin, bowel regimen     with Colace and senna and Tylenol as needed. 5. IV access.  She has one peripheral IV. 6. Code status.  She is a DNR/DNI as discussed with the patient and     her son.  The patient will be admitted to team-[facility] of [facility].          ______________________________ Carlota Raspberry, MD     EB/MEDQ  D:  01/22/2011  T:  01/23/2011  Job:  [id number]  Electronically Signed by Carlota Raspberry MD on 02/19/2011 12:14:26 PM

## 2011-03-13 ENCOUNTER — Other Ambulatory Visit: Payer: Self-pay | Admitting: *Deleted

## 2011-03-13 MED ORDER — PHENYTOIN SODIUM EXTENDED 100 MG PO CAPS: ORAL_CAPSULE | ORAL | Status: DC

## 2011-03-13 MED ORDER — LEVOTHYROXINE SODIUM 100 MCG PO TABS: 100.0000 ug | ORAL_TABLET | Freq: Every day | ORAL | Status: DC

## 2011-03-18 ENCOUNTER — Other Ambulatory Visit: Payer: Self-pay | Admitting: *Deleted

## 2011-03-18 DIAGNOSIS — D649 Anemia, unspecified: Secondary | ICD-10-CM

## 2011-03-18 HISTORY — DX: Anemia, unspecified: D64.9

## 2011-03-18 MED ORDER — CLONAZEPAM 0.5 MG PO TABS: 0.5000 mg | ORAL_TABLET | Freq: Two times a day (BID) | ORAL | Status: DC | PRN

## 2011-03-19 ENCOUNTER — Telehealth: Payer: Self-pay | Admitting: Internal Medicine

## 2011-03-19 ENCOUNTER — Ambulatory Visit: Payer: PRIVATE HEALTH INSURANCE | Admitting: Internal Medicine

## 2011-03-19 NOTE — Telephone Encounter (Signed)
Called in and gave #[phone]

## 2011-03-19 NOTE — Telephone Encounter (Signed)
Pharmacist needs clarification on pts clonazePAM (KLONOPIN) 0.5 MG tablet . Need qty. Is it ok to fill for 90 day supply?

## 2011-03-21 ENCOUNTER — Ambulatory Visit: Payer: PRIVATE HEALTH INSURANCE | Admitting: Internal Medicine

## 2011-05-20 DIAGNOSIS — M461 Sacroiliitis, not elsewhere classified: Secondary | ICD-10-CM

## 2011-05-20 HISTORY — PX: HAMMER TOE SURGERY: SHX385

## 2011-05-20 HISTORY — DX: Sacroiliitis, not elsewhere classified: M46.1

## 2011-05-27 DIAGNOSIS — M461 Sacroiliitis, not elsewhere classified: Secondary | ICD-10-CM | POA: Diagnosis not present

## 2011-06-09 DIAGNOSIS — E039 Hypothyroidism, unspecified: Secondary | ICD-10-CM | POA: Diagnosis not present

## 2011-06-09 DIAGNOSIS — D649 Anemia, unspecified: Secondary | ICD-10-CM | POA: Diagnosis not present

## 2011-06-09 DIAGNOSIS — F070 Personality change due to known physiological condition: Secondary | ICD-10-CM | POA: Diagnosis not present

## 2011-06-09 DIAGNOSIS — R7989 Other specified abnormal findings of blood chemistry: Secondary | ICD-10-CM | POA: Diagnosis not present

## 2011-06-17 DIAGNOSIS — E039 Hypothyroidism, unspecified: Secondary | ICD-10-CM | POA: Diagnosis not present

## 2011-06-17 DIAGNOSIS — D649 Anemia, unspecified: Secondary | ICD-10-CM | POA: Diagnosis not present

## 2011-06-17 DIAGNOSIS — R300 Dysuria: Secondary | ICD-10-CM

## 2011-06-17 DIAGNOSIS — F070 Personality change due to known physiological condition: Secondary | ICD-10-CM | POA: Diagnosis not present

## 2011-06-17 DIAGNOSIS — M1990 Unspecified osteoarthritis, unspecified site: Secondary | ICD-10-CM | POA: Diagnosis not present

## 2011-06-17 HISTORY — DX: Dysuria: R30.0

## 2011-06-19 DIAGNOSIS — R300 Dysuria: Secondary | ICD-10-CM | POA: Diagnosis not present

## 2011-06-19 DIAGNOSIS — N390 Urinary tract infection, site not specified: Secondary | ICD-10-CM | POA: Diagnosis not present

## 2011-07-01 DIAGNOSIS — L84 Corns and callosities: Secondary | ICD-10-CM

## 2011-07-01 DIAGNOSIS — E039 Hypothyroidism, unspecified: Secondary | ICD-10-CM | POA: Diagnosis not present

## 2011-07-01 HISTORY — DX: Corns and callosities: L84

## 2011-07-22 ENCOUNTER — Encounter: Payer: Self-pay | Admitting: Gastroenterology

## 2011-09-22 DIAGNOSIS — M2040 Other hammer toe(s) (acquired), unspecified foot: Secondary | ICD-10-CM | POA: Diagnosis not present

## 2011-09-22 DIAGNOSIS — M79609 Pain in unspecified limb: Secondary | ICD-10-CM | POA: Diagnosis not present

## 2011-09-22 DIAGNOSIS — M24576 Contracture, unspecified foot: Secondary | ICD-10-CM | POA: Diagnosis not present

## 2011-10-23 DIAGNOSIS — M24573 Contracture, unspecified ankle: Secondary | ICD-10-CM | POA: Diagnosis not present

## 2011-10-23 DIAGNOSIS — E039 Hypothyroidism, unspecified: Secondary | ICD-10-CM | POA: Diagnosis not present

## 2011-10-23 DIAGNOSIS — M2040 Other hammer toe(s) (acquired), unspecified foot: Secondary | ICD-10-CM | POA: Diagnosis not present

## 2011-10-23 DIAGNOSIS — M24576 Contracture, unspecified foot: Secondary | ICD-10-CM | POA: Diagnosis not present

## 2011-10-28 DIAGNOSIS — M24573 Contracture, unspecified ankle: Secondary | ICD-10-CM | POA: Diagnosis not present

## 2011-10-28 DIAGNOSIS — M24576 Contracture, unspecified foot: Secondary | ICD-10-CM | POA: Diagnosis not present

## 2011-10-28 DIAGNOSIS — M2040 Other hammer toe(s) (acquired), unspecified foot: Secondary | ICD-10-CM | POA: Diagnosis not present

## 2011-11-24 DIAGNOSIS — M24576 Contracture, unspecified foot: Secondary | ICD-10-CM | POA: Diagnosis not present

## 2011-11-24 DIAGNOSIS — M2040 Other hammer toe(s) (acquired), unspecified foot: Secondary | ICD-10-CM | POA: Diagnosis not present

## 2011-11-24 DIAGNOSIS — M24573 Contracture, unspecified ankle: Secondary | ICD-10-CM | POA: Diagnosis not present

## 2011-12-04 DIAGNOSIS — E039 Hypothyroidism, unspecified: Secondary | ICD-10-CM | POA: Diagnosis not present

## 2011-12-04 DIAGNOSIS — R7989 Other specified abnormal findings of blood chemistry: Secondary | ICD-10-CM | POA: Diagnosis not present

## 2011-12-04 DIAGNOSIS — R569 Unspecified convulsions: Secondary | ICD-10-CM | POA: Diagnosis not present

## 2011-12-16 DIAGNOSIS — R7989 Other specified abnormal findings of blood chemistry: Secondary | ICD-10-CM | POA: Diagnosis not present

## 2011-12-16 DIAGNOSIS — M205X9 Other deformities of toe(s) (acquired), unspecified foot: Secondary | ICD-10-CM

## 2011-12-16 DIAGNOSIS — R569 Unspecified convulsions: Secondary | ICD-10-CM | POA: Diagnosis not present

## 2011-12-16 DIAGNOSIS — F070 Personality change due to known physiological condition: Secondary | ICD-10-CM | POA: Diagnosis not present

## 2011-12-16 DIAGNOSIS — M461 Sacroiliitis, not elsewhere classified: Secondary | ICD-10-CM | POA: Diagnosis not present

## 2011-12-16 DIAGNOSIS — E039 Hypothyroidism, unspecified: Secondary | ICD-10-CM | POA: Diagnosis not present

## 2011-12-16 HISTORY — DX: Other deformities of toe(s) (acquired), unspecified foot: M20.5X9

## 2011-12-22 DIAGNOSIS — M24576 Contracture, unspecified foot: Secondary | ICD-10-CM | POA: Diagnosis not present

## 2011-12-22 DIAGNOSIS — M24573 Contracture, unspecified ankle: Secondary | ICD-10-CM | POA: Diagnosis not present

## 2011-12-22 DIAGNOSIS — M2040 Other hammer toe(s) (acquired), unspecified foot: Secondary | ICD-10-CM | POA: Diagnosis not present

## 2012-01-02 DIAGNOSIS — H2640 Unspecified secondary cataract: Secondary | ICD-10-CM | POA: Diagnosis not present

## 2012-01-02 DIAGNOSIS — H52209 Unspecified astigmatism, unspecified eye: Secondary | ICD-10-CM | POA: Diagnosis not present

## 2012-01-02 DIAGNOSIS — Z961 Presence of intraocular lens: Secondary | ICD-10-CM | POA: Diagnosis not present

## 2012-01-15 DIAGNOSIS — H26499 Other secondary cataract, unspecified eye: Secondary | ICD-10-CM | POA: Diagnosis not present

## 2012-01-15 DIAGNOSIS — H2640 Unspecified secondary cataract: Secondary | ICD-10-CM | POA: Diagnosis not present

## 2012-01-18 DIAGNOSIS — M2550 Pain in unspecified joint: Secondary | ICD-10-CM

## 2012-01-18 HISTORY — DX: Pain in unspecified joint: M25.50

## 2012-01-20 DIAGNOSIS — M1990 Unspecified osteoarthritis, unspecified site: Secondary | ICD-10-CM | POA: Diagnosis not present

## 2012-01-20 DIAGNOSIS — E039 Hypothyroidism, unspecified: Secondary | ICD-10-CM | POA: Diagnosis not present

## 2012-03-12 DIAGNOSIS — L851 Acquired keratosis [keratoderma] palmaris et plantaris: Secondary | ICD-10-CM | POA: Diagnosis not present

## 2012-03-12 DIAGNOSIS — Z23 Encounter for immunization: Secondary | ICD-10-CM | POA: Diagnosis not present

## 2012-03-12 DIAGNOSIS — M2040 Other hammer toe(s) (acquired), unspecified foot: Secondary | ICD-10-CM | POA: Diagnosis not present

## 2012-03-12 DIAGNOSIS — B351 Tinea unguium: Secondary | ICD-10-CM | POA: Diagnosis not present

## 2012-03-12 DIAGNOSIS — M2010 Hallux valgus (acquired), unspecified foot: Secondary | ICD-10-CM | POA: Diagnosis not present

## 2012-04-27 DIAGNOSIS — F070 Personality change due to known physiological condition: Secondary | ICD-10-CM | POA: Diagnosis not present

## 2012-04-27 DIAGNOSIS — M1990 Unspecified osteoarthritis, unspecified site: Secondary | ICD-10-CM | POA: Diagnosis not present

## 2012-06-07 DIAGNOSIS — R7989 Other specified abnormal findings of blood chemistry: Secondary | ICD-10-CM | POA: Diagnosis not present

## 2012-06-07 DIAGNOSIS — E039 Hypothyroidism, unspecified: Secondary | ICD-10-CM | POA: Diagnosis not present

## 2012-06-07 DIAGNOSIS — R569 Unspecified convulsions: Secondary | ICD-10-CM | POA: Diagnosis not present

## 2012-06-14 ENCOUNTER — Telehealth: Payer: Self-pay | Admitting: Internal Medicine

## 2012-06-14 NOTE — Telephone Encounter (Signed)
Talked with pt and she is confused today.  Did not recognize me, only stating I was someone that looked familiar-  Pt has an office visit with md at [facility]  Tomorrow for a cpx.  Pt states she has ov on 28 and didn't realize that is tomorrow. Continues to question me if tomrrow is the the 28.  When she realized it was she wants to wait and see her md tomorrow to check growth in mouth.. Not painful.  States she received a flyer about dr Selena Batten and would like a femlae md and I suggested [facility] ,due to convience.  She agreed. Dr Lovell Sheehan informed

## 2012-06-14 NOTE — Telephone Encounter (Signed)
Patient came in stating that she would like to change pcps from Dr. Lovell Sheehan to Dr. Selena Batten as she received a flyer in the mail. Please advise.

## 2012-06-14 NOTE — Telephone Encounter (Signed)
Patient does not want to switch  

## 2012-06-14 NOTE — Telephone Encounter (Signed)
Ok with she has been a very pleasant patient

## 2012-06-15 DIAGNOSIS — M2550 Pain in unspecified joint: Secondary | ICD-10-CM | POA: Diagnosis not present

## 2012-06-15 DIAGNOSIS — M1990 Unspecified osteoarthritis, unspecified site: Secondary | ICD-10-CM | POA: Diagnosis not present

## 2012-06-15 DIAGNOSIS — E039 Hypothyroidism, unspecified: Secondary | ICD-10-CM | POA: Diagnosis not present

## 2012-06-15 DIAGNOSIS — F070 Personality change due to known physiological condition: Secondary | ICD-10-CM | POA: Diagnosis not present

## 2012-06-15 DIAGNOSIS — R569 Unspecified convulsions: Secondary | ICD-10-CM | POA: Diagnosis not present

## 2012-06-15 DIAGNOSIS — R7989 Other specified abnormal findings of blood chemistry: Secondary | ICD-10-CM | POA: Diagnosis not present

## 2012-06-15 DIAGNOSIS — K921 Melena: Secondary | ICD-10-CM

## 2012-06-15 HISTORY — DX: Melena: K92.1

## 2012-08-23 ENCOUNTER — Encounter: Payer: Self-pay | Admitting: Gastroenterology

## 2012-09-08 ENCOUNTER — Telehealth: Payer: Self-pay | Admitting: *Deleted

## 2012-09-08 ENCOUNTER — Other Ambulatory Visit: Payer: Self-pay | Admitting: *Deleted

## 2012-09-08 MED ORDER — CLONAZEPAM 0.5 MG PO TABS: ORAL_TABLET | ORAL | Status: DC

## 2012-09-08 MED ORDER — LEVOTHYROXINE SODIUM 100 MCG PO TABS: 100.0000 ug | ORAL_TABLET | Freq: Every day | ORAL | Status: DC

## 2012-09-08 MED ORDER — PHENYTOIN SODIUM EXTENDED 100 MG PO CAPS: ORAL_CAPSULE | ORAL | Status: DC

## 2012-09-08 NOTE — Telephone Encounter (Signed)
Mailed Rx's for Levothyroxine, Clonazepam and Phenytoin into [facility] ([address] [place], [place] [address]) # [phone].  Called and informed patient. Patient Agreed.

## 2012-09-08 NOTE — Telephone Encounter (Signed)
Mailed 3 Rx's Levothyroxine, Phenytoin and Clonazepam into [facility]:  [address] [place], [place] [address]. # [phone] Patient Notified and Agreed.

## 2012-09-09 DIAGNOSIS — E039 Hypothyroidism, unspecified: Secondary | ICD-10-CM | POA: Diagnosis not present

## 2012-09-14 ENCOUNTER — Encounter: Payer: Self-pay | Admitting: Internal Medicine

## 2012-09-14 ENCOUNTER — Non-Acute Institutional Stay: Payer: Medicare Other | Admitting: Internal Medicine

## 2012-09-14 VITALS — BP 118/76 | HR 72 | Wt 135.0 lb

## 2012-09-14 DIAGNOSIS — H6121 Impacted cerumen, right ear: Secondary | ICD-10-CM

## 2012-09-14 DIAGNOSIS — E039 Hypothyroidism, unspecified: Secondary | ICD-10-CM

## 2012-09-14 DIAGNOSIS — K921 Melena: Secondary | ICD-10-CM

## 2012-09-14 DIAGNOSIS — M1990 Unspecified osteoarthritis, unspecified site: Secondary | ICD-10-CM

## 2012-09-14 DIAGNOSIS — R233 Spontaneous ecchymoses: Secondary | ICD-10-CM

## 2012-09-14 DIAGNOSIS — R413 Other amnesia: Secondary | ICD-10-CM

## 2012-09-14 DIAGNOSIS — G250 Essential tremor: Secondary | ICD-10-CM | POA: Diagnosis not present

## 2012-09-14 DIAGNOSIS — R42 Dizziness and giddiness: Secondary | ICD-10-CM

## 2012-09-14 DIAGNOSIS — H9190 Unspecified hearing loss, unspecified ear: Secondary | ICD-10-CM

## 2012-09-14 DIAGNOSIS — H6120 Impacted cerumen, unspecified ear: Secondary | ICD-10-CM

## 2012-09-14 DIAGNOSIS — R7989 Other specified abnormal findings of blood chemistry: Secondary | ICD-10-CM

## 2012-09-14 DIAGNOSIS — G252 Other specified forms of tremor: Secondary | ICD-10-CM | POA: Diagnosis not present

## 2012-09-14 DIAGNOSIS — H9193 Unspecified hearing loss, bilateral: Secondary | ICD-10-CM

## 2012-09-14 DIAGNOSIS — R569 Unspecified convulsions: Secondary | ICD-10-CM

## 2012-09-14 DIAGNOSIS — F079 Unspecified personality and behavioral disorder due to known physiological condition: Secondary | ICD-10-CM

## 2012-09-19 DIAGNOSIS — K921 Melena: Secondary | ICD-10-CM | POA: Insufficient documentation

## 2012-09-19 DIAGNOSIS — F0391 Unspecified dementia with behavioral disturbance: Secondary | ICD-10-CM | POA: Insufficient documentation

## 2012-09-19 DIAGNOSIS — R739 Hyperglycemia, unspecified: Secondary | ICD-10-CM | POA: Insufficient documentation

## 2012-09-19 DIAGNOSIS — H9190 Unspecified hearing loss, unspecified ear: Secondary | ICD-10-CM | POA: Insufficient documentation

## 2012-09-19 DIAGNOSIS — H6120 Impacted cerumen, unspecified ear: Secondary | ICD-10-CM | POA: Insufficient documentation

## 2012-09-19 NOTE — Progress Notes (Signed)
Subjective:    Patient ID: Rebecca [name], female    DOB: [birth year], 79 y.o.   MRN: [id number]  HPI  Patient says she is concerned enough about her hearing loss that she has an appointment to be seen at [facility].  Problem List Items Addressed This Visit     ICD-9-CM     Unprioritized   HYPOTHYROIDISM (Chronic): Unchanged since prior visits and stable on current medication    Relevant Medications      levothyroxine (SYNTHROID, LEVOTHROID) 100 MCG tablet   TREMOR, ESSENTIAL: Mild tremor at rest. No current evidence for Parkinson's disease. Most likely diagnosis is essential tremor.    OSTEOARTHRITIS: Diffuse arthralgias without change    SEIZURE DISORDER: No further seizures since on Dilantin    DIZZINESS OR VERTIGO: Unstable feeling when walking. He also has some accompanying "groggy" feelings. She does not attribute this to current medications.    Senile ecchymosis - Primary: Diffuse bruising particularly on the forearms and dorsum of the hands.    Other abnormal blood chemistry: Hyperglycemia in the past    Memory loss: Mild memory loss. Previous MMSE showed 27/30    Blood in stool: Recurrent and likely related to her hemorrhoids    Impacted cerumen: Complains of right ear feeling "stopped up"    RESOLVED: ORGANIC BRAIN SYNDROME; diagnosis was rolled under memory loss. :         Review of Systems  Constitutional: Negative.   HENT:       Bilateral hearing loss  Eyes: Negative.   Respiratory:       History of dyspnea on exertion  Cardiovascular: Negative.   Gastrointestinal:       Blood noted in stool and toilet paper occasionally.  Endocrine:       History of hypothyroidism  Genitourinary: Negative.   Musculoskeletal:       Osteoarthritis with complaints of pain in multiple areas.  Skin:       Senile ecchymoses  Allergic/Immunologic: Negative.   Neurological: Negative.   Hematological: Negative.   Psychiatric/Behavioral:       Chronic anxiety.      Objective:   Physical Exam  Constitutional: She is oriented to person, place, and time. She appears well-developed and well-nourished. No distress.  HENT:  Bilateral hearing loss.  Eyes:  Lens implants in both eyes  Neck: Normal range of motion. Neck supple. No JVD present. No tracheal deviation present. No thyromegaly present.  Cardiovascular: Normal rate, regular rhythm, normal heart sounds and intact distal pulses.  Exam reveals no gallop and no friction rub.   No murmur heard. Pulmonary/Chest: Effort normal and breath sounds normal. No respiratory distress. She has no wheezes. She exhibits no tenderness.  Abdominal: Soft. Bowel sounds are normal. She exhibits no distension and no mass. There is no tenderness.  Genitourinary: Guaiac negative stool.  Hemorrhoids. Normal sphincter tone. Stools are normal brown color.  Musculoskeletal: Normal range of motion. She exhibits no edema and no tenderness.  Hammertoes at the left second and third. Right second toe overlaps the great toe.  Lymphadenopathy:    She has no cervical adenopathy.  Neurological: She is alert and oriented to person, place, and time. No cranial nerve deficit. Coordination normal.  Skin: Skin is warm and dry. No rash noted. No erythema. No pallor.  Psychiatric: She has a normal mood and affect. Her behavior is normal. Thought content normal.   Lab reports; 06/09/2011 CBC: Rbc 4.08, Hgb 13.2, Hct 40.7 CMP: glucose 82,  BUN 25, Creatinine 0.74 Lipid: cholesterol 208, triglyceride 96, HDL 65, LDL 124 TSH 0.998 [id number] 5.5 Phenytoin 14.7 12/04/2011 BMP; glucose 80, BUN 21, Creatinine 0.58 TSH 5.087 Phenytoin 17.9  06/07/2012 CBC: Wbc 6.3, Rbc 4.17, Hgb 13.7, Hct 40.5, Platelet 199 CMP: Sodium 141, Potassium 4.1, glucose 97, BUN 25, Creatinine 0.67 Lipid: cholesterol 214, triglyceride 76, HDL 79, LDL 120 TSH 7.491 Dilantin 14.0  09/09/12 TSH 3.039    Assessment & Plan:     ICD-9-CM  1. HYPOTHYROIDISM stable on  current medications  244.9  2. Senile ecchymosis Issue. No new orders  782.7  3. TREMOR, ESSENTIAL . It does not interfere with eating or lifestyle  333.1  4. DIZZINESS OR VERTIGO Slightly unsteady feeling with movement. Possible middle ear disorder. Patient is not uninterested in further evaluation at this time.  780.4  5. SEIZURE DISORDER No recurrence on current Dilantin dose  780.39  6. OSTEOARTHRITIS Diffuse. Chronic. No new orders.  715.90  7. Other abnormal blood chemistry Most recent blood sugar was normal.  790.6  8. Memory loss Mild. Stable. No new orders  780.93  9. 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 Blood in stool Current stool check is negative  578.1  11. Impacted cerumen, right Ear canals were lavaged. Cerumen blockage cleared.  380.4  12. Hearing decreased, bilateral Chronic. Likely based on degeneration of eighth cranial nerve.Proceede'll have as planned with appointment at [facility]  389.9

## 2012-10-06 ENCOUNTER — Ambulatory Visit (INDEPENDENT_AMBULATORY_CARE_PROVIDER_SITE_OTHER): Payer: Medicare Other | Admitting: Nurse Practitioner

## 2012-10-06 ENCOUNTER — Encounter: Payer: Self-pay | Admitting: Nurse Practitioner

## 2012-10-06 VITALS — BP 110/72 | HR 69 | Temp 98.2°F | Resp 20 | Ht 65.0 in | Wt 137.0 lb

## 2012-10-06 DIAGNOSIS — S91309A Unspecified open wound, unspecified foot, initial encounter: Secondary | ICD-10-CM | POA: Diagnosis not present

## 2012-10-06 DIAGNOSIS — S91332A Puncture wound without foreign body, left foot, initial encounter: Secondary | ICD-10-CM

## 2012-10-06 DIAGNOSIS — Z23 Encounter for immunization: Secondary | ICD-10-CM | POA: Diagnosis not present

## 2012-10-06 NOTE — Progress Notes (Signed)
Patient ID: Rebecca [name], female   DOB: 02-19-1928, 79 y.o.   MRN: [id number]  No Known Allergies  Chief Complaint  Patient presents with  . Acute Visit    stepped on needle with left foot    HPI: Patient is a 79 y.o. female seen in the office today for acute visit Late last night pt was ironing and foot was getting a cramp; she couldn't walking and looked down and saw a needle that had went through her foot. She was barefoot and she stepped on the needle Requesting tetanus vaccine. Last vaccine was 2007. Pulled needled out with a pair of pliers. No pain now about to walk on her foot without difficulty. No fevers or chills.   Review of Systems:  Review of Systems  Constitutional: Negative for fever, chills and malaise/fatigue.  Respiratory: Negative for shortness of breath.   Cardiovascular: Negative for chest pain.  Musculoskeletal: Negative for myalgias and joint pain.  Neurological: Negative for weakness and headaches.     Past Medical History  Diagnosis Date  . Osteoporosis 02/2011  . Seizures   . DJD (degenerative joint disease)   . Thyroid disease   . Diverticulosis 02/2011  . Hemorrhoids 02/2011  . Dizziness   . Low back pain   . Mitral valve problem thickened    thickened  . Unspecified hypothyroidism 01/2011  . 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 Vitamin D deficiency 01/2011  . Anemia, unspecified 03/18/2011  . Anxiety state, unspecified 01/2011  . 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 Pain in joint, site unspecified 01/2012  . Sacroiliitis, not elsewhere classified 05/2011  . Dysuria 06/17/2011  . Other abnormal blood chemistry 0/30/2012  . Blood in stool 06/15/2012  . Other acquired deformity of toe 12/16/2011  . Corns and callosities 07/01/2011   Past Surgical History  Procedure Laterality Date  . Joint replacement    . Cervical laminectomy    . Bletheroplasty     Social History:   reports that she  quit smoking about 24 years ago. She has never used smokeless tobacco. She reports that she drinks about 0.6 ounces of alcohol per week. She reports that she does not use illicit drugs.  Family History  Problem Relation Age of Onset  . Alzheimer's disease Sister   . Emphysema Sister     Medications: Patient's Medications  New Prescriptions   No medications on file  Previous Medications   CALCIUM CARBONATE 200 MG CAPSULE    Take 250 mg by mouth 2 (two) times daily with a meal.     CHOLECALCIFEROL (VITAMIN D3) 1000 UNITS CAPSULE    Take 1,000 Units by mouth daily.     CLONAZEPAM (KLONOPIN) 0.5 MG TABLET    Take one tablet by mouth twice daily as needed   LEVOTHYROXINE (SYNTHROID, LEVOTHROID) 100 MCG TABLET    Take 100 mcg by mouth daily before breakfast. For thyroid   PHENYTOIN (DILANTIN) 100 MG ER CAPSULE    1 in the am and 3 at bedtime  Modified Medications   No medications on file  Discontinued Medications   No medications on file     Physical Exam:  Filed Vitals:   10/06/12 1509  BP: 110/72  Pulse: 69  Temp: 98.2 F (36.8 C)  TempSrc: Oral  Resp: 20  Height: 5\' 5"  (1.651 m)  Weight: 137 lb (62.143 kg)  SpO2: 95%    Physical Exam  Constitutional: She is  well-developed, well-nourished, and in no distress. No distress.  Cardiovascular: Normal rate, regular rhythm and normal heart sounds.   Pulmonary/Chest: Effort normal and breath sounds normal.  Abdominal: Soft. Bowel sounds are normal.  Skin: Skin is warm and dry. No rash noted. She is not diaphoretic. No erythema.  Small pinpoint exit site from where needed was pulled out on dorsal surface of foot.        Assessment/Plan Puncture wound of foot- due to stepping on needle- area has heeled and there is no edema or erythema noted. Will give tetanus booster at this time.  Educated pt to seek medical attention if area becomes swollen, red, and or tender. Educated to wear foot wear to prevent stepping on any other  objects

## 2012-10-06 NOTE — Patient Instructions (Signed)
Tetanus and Diphtheria Vaccine Your caregiver has suggested that you receive an immunization to prevent tetanus (lockjaw) and diphtheria. Tetanus and diphtheria are serious and deadly infectious diseases of the past that have been nearly wiped out by modern immunizations. Td or DT vaccines (shots) are the immunizations given to help prevent these illnesses. Td is the medical term for a standard tetanus dose, small diphtheria dose. DT means both in standard doses. ABOUT THE DISEASES Tetanus (lockjaw) and diphtheria are serious diseases. Tetanus is caused by a germ that lives in the soil. It enters the body through a cut or wound, often caused by a nail or broken piece of glass. You cannot catch tetanus from another person. Diphtheria spreads when germs pass from an infected person to the nose or throat of others. Tetanus causes serious, painful spasms of all muscles. It can lead to:  "Locking" of the muscles of the jaw and throat, so the patient cannot open his or her mouth or swallow.  Damage to the heart muscle. Diphtheria causes a thick coating in the nose, throat, or airway. It can lead to:  Breathing problems.  Kidney problems.  Heart failure.  Paralysis.  Death. ABOUT THE VACCINES  A vaccine is a shot (immunization) that can help prevent a disease. Vaccines have helped lower the rates of getting certain diseases. If people stopped getting vaccinated, more people would develop illnesses. These vaccines can be used in three ways:  As catch-up for people who did not get all their doses when they were children.  As a booster dose every 10 years.  For protection against tetanus infection, after a wound. Benefits of the vaccines Vaccination is the best way to protect against tetanus and diphtheria. Because of vaccination, there are fewer cases of these diseases. Cases are rare in children because most get a routine vaccination with DTP (Diphtheria, Tetanus, and Pertussis), DTaP  (Diphtheria, Tetanus, and acellular Pertussis), or DT (Diphtheria and Tetanus) vaccines. There would be many more cases if we stopped vaccinating people. Tetanus kills about 1 in 5 people who are infected. WHEN SHOULD YOU GET TD VACCINE?  Td is made for people 7 years of age and older.  People who have not gotten at least 3 doses of any tetanus and diphtheria vaccine (DTP, DTaP or DT) during their lifetime should do so using Td. After a person gets the third dose, a Td dose is needed every 10 years all through life. This is because protection fades over time. Booster shots are needed every 10 years.  Other vaccines may be given at the same time as Td. You may not know today whether your immunizations are current. The vaccine given today is to protect you from your next cut or injury. It does not offer protection for the current injury. An immune globulin injection may be given, if protection is needed immediately. Check with your caregiver later regarding your immunization status. Tell your caregiver if the person getting the vaccine:  Has ever had a serious allergic reaction or other problem with Td, or any other tetanus and diphtheria vaccine (DTP, DTaP, or DT). People who have had a serious allergic reaction should not receive the vaccine.  Has epilepsy or another nervous system illness.  Has had Guillain Barre Syndrome (GBS) in the past.  Now has a moderate or severe illness.  Is pregnant.  If you are not sure, ask your caregiver. WHAT ARE THE RISKS FROM TD VACCINE?  As with any medicine, there are very small   risks that serious problems, even death, could occur after getting a vaccine. However, the risk of a serious side effect from the vaccine is almost zero.  The risks from the vaccine are much smaller than the risks from the diseases, if people stopped getting vaccinated. Both diseases can cause serious health problems, which are prevented by the vaccine.  Almost all people who get  Td have no problems from it. Mild problems If mild problems occur, they usually start within hours to a day or two after vaccination. They may last 1-2 days:  Soreness, redness, or swelling where the shot was given.  Headache or tiredness.  Occasionally, a low grade fever. These problems can be worse in adults who get Td vaccine very often. Non-aspirin medicines may be used to reduce soreness. Severe problems These problems happen very rarely:  Serious allergic reaction (at most, occurs in 1 in 1 million vaccinated persons). This occurs almost immediately, and is treatable with medicines. Signs of a serious allergic reaction include:  Difficulty breathing.  Hoarseness or wheezing.  Hives.  Dizziness.  Deep, aching pain and muscle wasting in upper arm(s). Overall, the benefits to you and your family from these vaccines are far greater than the risk. WHAT TO DO IF THERE IS A SERIOUS REACTION:  Call a caregiver or get the person to a doctor or emergency room right away.  Write down what happened, the date and time it happened, and tell your caregiver.  Ask your caregiver to file a Vaccine Adverse Event Report form or call, toll-free: [phone] If you want to learn more about this vaccine, ask your caregiver. She/he can give you the vaccine package insert or suggest other sources of information. Also, the National Vaccine Injury Compensation Program gives compensation (payment) for persons thought to be injured by vaccines. For details call, toll-free: [phone]) 338-2382. Document Released: 05/02/2000 Document Revised: 07/28/2011 Document Reviewed: 03/22/2009 [employer] Patient Information 2014 [employer].  

## 2012-10-07 ENCOUNTER — Ambulatory Visit: Payer: Medicare Other | Admitting: Nurse Practitioner

## 2012-10-15 ENCOUNTER — Encounter: Payer: Self-pay | Admitting: Internal Medicine

## 2012-11-09 ENCOUNTER — Non-Acute Institutional Stay: Payer: Medicare Other | Admitting: Internal Medicine

## 2012-11-09 ENCOUNTER — Encounter: Payer: Self-pay | Admitting: Internal Medicine

## 2012-11-09 VITALS — BP 100/62 | HR 68 | Ht 65.0 in | Wt 136.0 lb

## 2012-11-09 DIAGNOSIS — R413 Other amnesia: Secondary | ICD-10-CM

## 2012-11-09 DIAGNOSIS — M205X9 Other deformities of toe(s) (acquired), unspecified foot: Secondary | ICD-10-CM | POA: Diagnosis not present

## 2012-11-09 DIAGNOSIS — G250 Essential tremor: Secondary | ICD-10-CM

## 2012-11-09 DIAGNOSIS — M1990 Unspecified osteoarthritis, unspecified site: Secondary | ICD-10-CM | POA: Diagnosis not present

## 2012-11-09 DIAGNOSIS — G252 Other specified forms of tremor: Secondary | ICD-10-CM

## 2012-11-09 NOTE — Progress Notes (Signed)
  Subjective:    Patient ID: Rebecca [name], female    DOB: March 03, 1928, 79 y.o.   MRN: [id number]  HPI Had Td in May 2014. She thinks her right arm is weaker since then. She got the injection in the left arm. She did not have any sort of generalized reaction to the injection.  Also having pain in her neck. Can't turn to look behind her.  Has some aching in her hands. Can't write very well. Seems to do better when she exercises.   Using meloxicam and tramadol.  Using cetirizine for sneezing and allergy.  She feels bored much of the time. Brain feels fuzzy.  Review of Systems  Constitutional: Negative.   HENT:       Bilateral hearing loss  Eyes: Negative.   Respiratory:       History of dyspnea on exertion  Cardiovascular: Negative.   Gastrointestinal:       Blood noted in stool and toilet paper occasionally.  Endocrine:       History of hypothyroidism  Genitourinary: Negative.   Musculoskeletal:       Osteoarthritis with complaints of pain in multiple areas.  Skin:       Senile ecchymoses  Allergic/Immunologic: Negative.   Neurological: Negative.   Hematological: Negative.   Psychiatric/Behavioral:       Chronic anxiety.       Objective:   Physical Exam  Constitutional: She is oriented to person, place, and time. She appears well-developed and well-nourished. No distress.  HENT:  Bilateral hearing loss.  Eyes:  Lens implants in both eyes  Neck: Normal range of motion. Neck supple. No JVD present. No tracheal deviation present. No thyromegaly present.  Cardiovascular: Normal rate, regular rhythm, normal heart sounds and intact distal pulses.  Exam reveals no gallop and no friction rub.   No murmur heard. Pulmonary/Chest: Effort normal and breath sounds normal. No respiratory distress. She has no wheezes. She exhibits no tenderness.  Abdominal: Soft. Bowel sounds are normal. She exhibits no distension and no mass. There is no tenderness.  Genitourinary: Guaiac negative  stool.  Hemorrhoids. Normal sphincter tone. Stools are normal brown color.  Musculoskeletal: Normal range of motion. She exhibits no edema and no tenderness.  Hammertoes at the left second and third. Right second toe overlaps the great toe. SP surgical repair of the right 2nd toe. Shoulders sore, but able to lift arms over her head and rotate a t the shoulders  Lymphadenopathy:    She has no cervical adenopathy.  Neurological: She is alert and oriented to person, place, and time. No cranial nerve deficit. Coordination normal.  Skin: Skin is warm and dry. No rash noted. No erythema. No pallor.  Psychiatric: She has a normal mood and affect. Her behavior is normal. Thought content normal.          Assessment & Plan:  OSTEOARTHRITIS: generalized. I do not think her Td injection had anything to do with the pains she has. Advised her to get more active with water exercises again.  Memory loss: uunchanged  TREMOR, ESSENTIAL: mild. Unchanged  Other acquired deformity of toe: she is planning to see another podiatrist. I suggested Dr. Wynelle Cleveland.

## 2012-11-09 NOTE — Patient Instructions (Signed)
Begin exercising again.

## 2012-11-30 ENCOUNTER — Non-Acute Institutional Stay: Payer: Medicare Other | Admitting: Internal Medicine

## 2012-11-30 ENCOUNTER — Encounter: Payer: Self-pay | Admitting: Internal Medicine

## 2012-11-30 VITALS — BP 100/60 | HR 62 | Ht 65.0 in | Wt 131.0 lb

## 2012-11-30 DIAGNOSIS — Z5189 Encounter for other specified aftercare: Secondary | ICD-10-CM

## 2012-11-30 DIAGNOSIS — K921 Melena: Secondary | ICD-10-CM | POA: Diagnosis not present

## 2012-11-30 DIAGNOSIS — R7989 Other specified abnormal findings of blood chemistry: Secondary | ICD-10-CM

## 2012-11-30 DIAGNOSIS — Z9181 History of falling: Secondary | ICD-10-CM | POA: Diagnosis not present

## 2012-11-30 DIAGNOSIS — E039 Hypothyroidism, unspecified: Secondary | ICD-10-CM | POA: Diagnosis not present

## 2012-11-30 DIAGNOSIS — D649 Anemia, unspecified: Secondary | ICD-10-CM

## 2012-11-30 DIAGNOSIS — S46211D Strain of muscle, fascia and tendon of other parts of biceps, right arm, subsequent encounter: Secondary | ICD-10-CM

## 2012-11-30 DIAGNOSIS — S46219A Strain of muscle, fascia and tendon of other parts of biceps, unspecified arm, initial encounter: Secondary | ICD-10-CM | POA: Insufficient documentation

## 2012-11-30 DIAGNOSIS — M1990 Unspecified osteoarthritis, unspecified site: Secondary | ICD-10-CM

## 2012-11-30 NOTE — Patient Instructions (Addendum)
Continue current medications. You do not need to take the Omega XL. I did no t order this drug.

## 2012-11-30 NOTE — Progress Notes (Signed)
Subjective:    Patient ID: Rebecca [name], female    DOB: 04-25-1928, 79 y.o.   MRN: [id number]  HPI Larey Seat 11/20/12 at night when rushing to urinate. Tripped on a bathroom rug. Extensive bruise og the right lower leg. Neck hurts and right arm seems weak and hurts.  Right arm seems to have the biceps different from the left. Probable rupture of the long head of the biceps tendon. Painful to raise the right arm.   Says her right hand feels numb in the morning.This feeling goes away in an hour after waking.  Memory is slowly getting worse.  History of hyperglycemia. No recent lab.  Current Outpatient Prescriptions on File Prior to Visit  Medication Sig Dispense Refill  . Calcium Carb-Cholecalciferol (CALCIUM 500 +D PO) Take by mouth. Take one twice daily      . cetirizine (ZYRTEC) 10 MG tablet Take 10 mg by mouth daily.      . clonazePAM (KLONOPIN) 0.5 MG tablet Take one tablet by mouth twice daily as needed  180 tablet  3  . levothyroxine (SYNTHROID, LEVOTHROID) 100 MCG tablet Take 100 mcg by mouth daily before breakfast. For thyroid      . meloxicam (MOBIC) 15 MG tablet Take 15 mg by mouth. Take one tablet daily to help arthritis      . Multiple Vitamin (MULTIVITAMINS PO) Take by mouth. Take one tablet daily      . phenytoin (DILANTIN) 100 MG ER capsule 1 in the am and 3 at bedtime  360 capsule  3  . traMADol (ULTRAM) 50 MG tablet Take 50 mg by mouth. Take one every 4-6 hours as needed for pain             Review of Systems  Constitutional: Negative.   HENT:       Bilateral hearing loss  Eyes: Negative.   Respiratory:       History of dyspnea on exertion  Cardiovascular: Negative.   Gastrointestinal:       Blood noted in stool and toilet paper occasionally.  Endocrine:       History of hypothyroidism  Genitourinary: Negative.   Musculoskeletal:       Osteoarthritis with complaints of pain in multiple areas.  Skin:       Senile ecchymoses  Allergic/Immunologic: Negative.    Neurological: Negative.   Hematological: Negative.   Psychiatric/Behavioral:       Chronic anxiety. Memory deficits.       Objective:BP 100/60  Pulse 62  Ht 5\' 5"  (1.651 m)  Wt 131 lb (59.421 kg)  BMI 21.8 kg/m2    Physical Exam  Constitutional: She is oriented to person, place, and time. She appears well-developed and well-nourished. No distress.  HENT:  Bilateral hearing loss.  Eyes:  Lens implants in both eyes  Neck: Normal range of motion. Neck supple. No JVD present. No tracheal deviation present. No thyromegaly present.  Cardiovascular: Normal rate, regular rhythm, normal heart sounds and intact distal pulses.  Exam reveals no gallop and no friction rub.   No murmur heard. Pulmonary/Chest: Effort normal and breath sounds normal. No respiratory distress. She has no wheezes. She exhibits no tenderness.  Abdominal: Soft. Bowel sounds are normal. She exhibits no distension and no mass. There is no tenderness.  Genitourinary: Guaiac negative stool.  Hemorrhoids. Normal sphincter tone. Stools are normal brown color.  Musculoskeletal: Normal range of motion. She exhibits no edema and no tenderness.  Hammertoes at the left second and third.  Right second toe overlaps the great toe. SP surgical repair of the right 2nd toe. Shoulders sore. Painful in the right shoulder when she lifts overhead. Possible rupture of the long head of the right biceps. [facility] July 2014. Unstable gait. Weaker grip of the right hand.  Lymphadenopathy:    She has no cervical adenopathy.  Neurological: She is alert and oriented to person, place, and time. No cranial nerve deficit. Coordination normal.  Skin: Skin is warm and dry. No rash noted. No erythema. No pallor.  Extensive bruise right lower leg as result of fall 11/20/12.  Psychiatric: She has a normal mood and affect. Her behavior is normal. Thought content normal.          Assessment & Plan:  Personal history of fall: extensive bruises right lower  leg. Resolving.  Blood in stool: no further episodes  Other abnormal blood chemistry: follow up hyperglycemia   - Plan: Basic Metabolic Panel  HYPOTHYROIDISM - Plan: TSH  Biceps tendon rupture, right, subsequent encounter: no further action needed. She does not want ortho referral  OSTEOARTHRITIS: generalized  Anemia, unspecified - Plan: CBC with Differential, Reticulocytes

## 2012-12-12 LAB — BASIC METABOLIC PANEL: Creatinine: 0.4 mg/dL — AB (ref <=1.1)

## 2012-12-20 DIAGNOSIS — M542 Cervicalgia: Secondary | ICD-10-CM | POA: Diagnosis not present

## 2013-01-03 DIAGNOSIS — D649 Anemia, unspecified: Secondary | ICD-10-CM | POA: Diagnosis not present

## 2013-01-03 DIAGNOSIS — R569 Unspecified convulsions: Secondary | ICD-10-CM | POA: Diagnosis not present

## 2013-01-03 DIAGNOSIS — R7989 Other specified abnormal findings of blood chemistry: Secondary | ICD-10-CM | POA: Diagnosis not present

## 2013-01-03 DIAGNOSIS — E039 Hypothyroidism, unspecified: Secondary | ICD-10-CM | POA: Diagnosis not present

## 2013-01-11 ENCOUNTER — Non-Acute Institutional Stay: Payer: Medicare Other | Admitting: Internal Medicine

## 2013-01-11 ENCOUNTER — Encounter: Payer: Self-pay | Admitting: Internal Medicine

## 2013-01-11 VITALS — BP 110/60 | HR 60 | Ht 65.0 in | Wt 131.0 lb

## 2013-01-11 DIAGNOSIS — R413 Other amnesia: Secondary | ICD-10-CM

## 2013-01-11 DIAGNOSIS — E039 Hypothyroidism, unspecified: Secondary | ICD-10-CM | POA: Diagnosis not present

## 2013-01-11 DIAGNOSIS — M542 Cervicalgia: Secondary | ICD-10-CM | POA: Insufficient documentation

## 2013-01-11 DIAGNOSIS — D649 Anemia, unspecified: Secondary | ICD-10-CM

## 2013-01-11 DIAGNOSIS — R42 Dizziness and giddiness: Secondary | ICD-10-CM | POA: Diagnosis not present

## 2013-01-11 DIAGNOSIS — R569 Unspecified convulsions: Secondary | ICD-10-CM | POA: Diagnosis not present

## 2013-01-11 DIAGNOSIS — R7989 Other specified abnormal findings of blood chemistry: Secondary | ICD-10-CM

## 2013-01-11 DIAGNOSIS — Z9181 History of falling: Secondary | ICD-10-CM

## 2013-01-11 MED ORDER — CYCLOBENZAPRINE HCL 5 MG PO TABS: ORAL_TABLET | ORAL | Status: DC

## 2013-01-11 NOTE — Progress Notes (Signed)
Subjective:    Patient ID: Rebecca [name], female    DOB: 1927/08/25, 79 y.o.   MRN: [id number]  HPI Here to F/U anemia, thyroid, hyperglycemia, and seizures.  Larey Seat again Dec 20, 2012. Hurt her neck. Saw ortho, Dr. Darrelyn Hillock. Had xrays of neck. Using Icy-Hot to the neck. Sometimes the pain goes down the right arm. History of fracture of C7  In 2011.  Has had some upper anterior chest pains from time to time. No dyspnea  HYPOTHYROIDISM: stable on supplement  SEIZURE DISORDER: last seizure was 3-4 yerars ago. Dilantin level is OK.  DIZZINESS OR VERTIGO: Sometimes she has a dizzy spell, but she says that had nothing to do with her falls.  Other abnormal blood chemistry: history of hyperglycemia. Recently normal  Memory loss: chronic  Anemia, unspecified: resolved  Personal history of fall: recurrent. tripped on a small rug in her bathroom recently.    Current Outpatient Prescriptions on File Prior to Visit  Medication Sig Dispense Refill  . Calcium Carb-Cholecalciferol (CALCIUM 500 +D PO) Take by mouth. Take one twice daily      . cetirizine (ZYRTEC) 10 MG tablet Take 10 mg by mouth daily.      . clonazePAM (KLONOPIN) 0.5 MG tablet Take one tablet by mouth twice daily as needed  180 tablet  3  . levothyroxine (SYNTHROID, LEVOTHROID) 100 MCG tablet Take 100 mcg by mouth daily before breakfast. For thyroid      . meloxicam (MOBIC) 15 MG tablet Take 15 mg by mouth. Take one tablet daily to help arthritis      . Multiple Vitamin (MULTIVITAMINS PO) Take by mouth. Take one tablet daily      . naproxen sodium (ANAPROX) 220 MG tablet Take 220 mg by mouth. One up to 3 times daily for pain      . phenytoin (DILANTIN) 100 MG ER capsule 1 in the am and 3 at bedtime  360 capsule  3  . traMADol (ULTRAM) 50 MG tablet Take 50 mg by mouth. Take one every 4-6 hours as needed for pain      . Vitamin D, Ergocalciferol, (DRISDOL) 50000 UNITS CAPS Take 50,000 Units by mouth. Take one capsule twice daily        No current facility-administered medications on file prior to visit.    Review of Systems  Constitutional: Negative.   HENT:       Bilateral hearing loss  Eyes: Negative.   Respiratory:       History of dyspnea on exertion  Cardiovascular: Negative.   Gastrointestinal:       Blood noted in stool and toilet paper occasionally.  Endocrine:       History of hypothyroidism  Genitourinary: Negative.   Musculoskeletal:       Osteoarthritis with complaints of pain in multiple areas.  Skin:       Senile ecchymoses  Allergic/Immunologic: Negative.   Neurological: Negative.   Hematological: Negative.   Psychiatric/Behavioral:       Chronic anxiety. Memory deficits.       Objective:BP 110/60  Pulse 60  Ht 5\' 5"  (1.651 m)  Wt 131 lb (59.421 kg)  BMI 21.8 kg/m2    Physical Exam  Constitutional: She is oriented to person, place, and time. She appears well-developed and well-nourished. No distress.  HENT:  Bilateral hearing loss.  Eyes:  Lens implants in both eyes  Neck: Normal range of motion. Neck supple. No JVD present. No tracheal deviation present. No thyromegaly present.  Cardiovascular: Normal rate, regular rhythm, normal heart sounds and intact distal pulses.  Exam reveals no gallop and no friction rub.   No murmur heard. Pulmonary/Chest: Effort normal and breath sounds normal. No respiratory distress. She has no wheezes. She exhibits no tenderness.  Abdominal: Soft. Bowel sounds are normal. She exhibits no distension and no mass. There is no tenderness.  Genitourinary: Guaiac negative stool.  Hemorrhoids. Normal sphincter tone. Stools are normal brown color.  Musculoskeletal: Normal range of motion. She exhibits no edema and no tenderness.  Hammertoes at the left second and third. Right second toe overlaps the great toe. SP surgical repair of the right 2nd toe. Shoulders sore. Painful in the right shoulder when she lifts overhead. Possible rupture of the long head of the  right biceps. [facility] July 2014. Unstable gait. Weaker grip of the right hand.  Lymphadenopathy:    She has no cervical adenopathy.  Neurological: She is alert and oriented to person, place, and time. No cranial nerve deficit. Coordination normal.  Skin: Skin is warm and dry. No rash noted. No erythema. No pallor.  Extensive bruise right lower leg as result of fall 11/20/12.  Psychiatric: She has a normal mood and affect. Her behavior is normal. Thought content normal.      LAB REVIEW 01/03/13 CBC: NORMAL  Retic 1.3%  CMP: normal  TSH 1.106  Dilantin: 21.1    Assessment & Plan:  Cervicalgia - Plan: cyclobenzaprine (FLEXERIL) 5 MG tablet  HYPOTHYROIDISM: stable on supplements  SEIZURE DISORDER: Stable  DIZZINESS OR VERTIGO: chronic  Other abnormal blood chemistry: normal glucose on last lab  Memory loss: chronic, no worse  Anemia, unspecified: resolved  Personal history of fall: multiple.

## 2013-01-11 NOTE — Patient Instructions (Signed)
Sedation may be a side effect of the muscle relaxer. Continue other medications.

## 2013-01-25 ENCOUNTER — Other Ambulatory Visit: Payer: Self-pay | Admitting: *Deleted

## 2013-01-25 ENCOUNTER — Encounter: Payer: Self-pay | Admitting: Internal Medicine

## 2013-01-25 MED ORDER — LEVOTHYROXINE SODIUM 100 MCG PO TABS: 100.0000 ug | ORAL_TABLET | Freq: Every day | ORAL | Status: DC

## 2013-02-08 ENCOUNTER — Encounter: Payer: Self-pay | Admitting: Internal Medicine

## 2013-02-08 ENCOUNTER — Non-Acute Institutional Stay: Payer: Medicare Other | Admitting: Internal Medicine

## 2013-02-08 VITALS — BP 100/62 | HR 60 | Ht 65.0 in | Wt 132.0 lb

## 2013-02-08 DIAGNOSIS — M25511 Pain in right shoulder: Secondary | ICD-10-CM

## 2013-02-08 DIAGNOSIS — R413 Other amnesia: Secondary | ICD-10-CM

## 2013-02-08 DIAGNOSIS — M25519 Pain in unspecified shoulder: Secondary | ICD-10-CM

## 2013-02-08 DIAGNOSIS — M542 Cervicalgia: Secondary | ICD-10-CM | POA: Diagnosis not present

## 2013-02-08 DIAGNOSIS — IMO0002 Reserved for concepts with insufficient information to code with codable children: Secondary | ICD-10-CM

## 2013-02-08 NOTE — Progress Notes (Signed)
Subjective:    Patient ID: Rebecca [name], female    DOB: 1927-10-31, 79 y.o.   MRN: [id number]  HPI Cervicalgia; she remains on the meloxicam, acetaminophen 650. Taking cyclobenzaprine at night only because it made her confused in the days.  This is believed to be healed, but may be contributing to at least a part of her chronic neck pain.  Closed fracture of seventh cervical vertebra without mention of spinal cord injury, sequela.: fracture at C7 noted in 2011 on CT of neck. Also had MRI of the neck.  She thinks she is having some memory issues. This is of long standing. Very difficult to get an accurate and complete history.    Current Outpatient Prescriptions on File Prior to Visit  Medication Sig Dispense Refill  . Calcium Carb-Cholecalciferol (CALCIUM 500 +D PO) Take by mouth. Take one twice daily      . cyclobenzaprine (FLEXERIL) 5 MG tablet One up to 3 times daily to relax muscles.  50 tablet  4  . levothyroxine (SYNTHROID, LEVOTHROID) 100 MCG tablet Take 1 tablet (100 mcg total) by mouth daily before breakfast. For thyroid  90 tablet  3  . meloxicam (MOBIC) 15 MG tablet Take 15 mg by mouth. Take one tablet daily to help arthritis      . Multiple Vitamin (MULTIVITAMINS PO) Take by mouth. Take one tablet daily      . phenytoin (DILANTIN) 100 MG ER capsule 1 in the am and 3 at bedtime  360 capsule  3  . traMADol (ULTRAM) 50 MG tablet Take 50 mg by mouth. Take one every 4-6 hours as needed for pain       No current facility-administered medications on file prior to visit.    Review of Systems  Constitutional: Negative.   HENT:       Bilateral hearing loss  Eyes: Negative.   Respiratory:       History of dyspnea on exertion  Cardiovascular: Negative.   Gastrointestinal:       Blood noted in stool and toilet paper occasionally.  Endocrine:       History of hypothyroidism  Genitourinary: Negative.   Musculoskeletal:       Osteoarthritis with complaints of pain in multiple  areas. Neck and right shoulder pains. Sharp pain in the right shoulder and arm when she abducts and rotated at the shoulder.  Skin:       Senile ecchymoses  Allergic/Immunologic: Negative.   Neurological: Negative.   Hematological: Negative.   Psychiatric/Behavioral:       Chronic anxiety. Memory deficits.       Objective:BP 100/62  Pulse 60  Ht 5\' 5"  (1.651 m)  Wt 132 lb (59.875 kg)  BMI 21.97 kg/m2    Physical Exam  Constitutional: She is oriented to person, place, and time. She appears well-developed and well-nourished. No distress.  HENT:  Bilateral hearing loss.  Eyes:  Lens implants in both eyes  Neck: Normal range of motion. Neck supple. No JVD present. No tracheal deviation present. No thyromegaly present.  Cardiovascular: Normal rate, regular rhythm, normal heart sounds and intact distal pulses.  Exam reveals no gallop and no friction rub.   No murmur heard. Pulmonary/Chest: Effort normal and breath sounds normal. No respiratory distress. She has no wheezes. She exhibits no tenderness.  Abdominal: Soft. Bowel sounds are normal. She exhibits no distension and no mass. There is no tenderness.  Genitourinary: Guaiac negative stool.  Hemorrhoids. Normal sphincter tone. Stools are normal brown  color.  Musculoskeletal: Normal range of motion. She exhibits no edema and no tenderness.  Hammertoes at the left second and third. Right second toe overlaps the great toe. SP surgical repair of the right 2nd toe.  Shoulders sore. Painful in the right shoulder when she lifts overhead and rotates at the [place].[facility] July 2014. Unstable gait.  Weaker grip of the right hand.  Lymphadenopathy:    She has no cervical adenopathy.  Neurological: She is alert and oriented to person, place, and time. No cranial nerve deficit. Coordination normal.  No loss of grip strength.  Skin: Skin is warm and dry. No rash noted. No erythema. No pallor.  Extensive bruise right lower leg as result of fall  11/20/12.  Psychiatric: She has a normal mood and affect. Her behavior is normal. Thought content normal.          Assessment & Plan:  Cervicalgia: worse after a fall July 2014.   Plan: refer back to ortho. Has seen Dr. Darrelyn Hillock in the past  Closed fracture of seventh cervical vertebra without mention of spinal cord injury, sequela: prior history  Pain in joint, shoulder region, right: worse  Memory loss: chronic with possible acute worsening. Will stop cyclobenzaprine and clonazepam.

## 2013-02-08 NOTE — Patient Instructions (Signed)
Stop clonazepam and cyclobenzaprine.

## 2013-02-09 DIAGNOSIS — Z23 Encounter for immunization: Secondary | ICD-10-CM | POA: Diagnosis not present

## 2013-02-10 ENCOUNTER — Other Ambulatory Visit: Payer: Self-pay

## 2013-02-10 ENCOUNTER — Ambulatory Visit (INDEPENDENT_AMBULATORY_CARE_PROVIDER_SITE_OTHER): Payer: Medicare Other | Admitting: Family Medicine

## 2013-02-10 ENCOUNTER — Ambulatory Visit: Payer: Medicare Other

## 2013-02-10 VITALS — BP 100/62 | HR 86 | Temp 98.3°F | Resp 17 | Ht 62.0 in | Wt 134.0 lb

## 2013-02-10 DIAGNOSIS — M25561 Pain in right knee: Secondary | ICD-10-CM

## 2013-02-10 DIAGNOSIS — M542 Cervicalgia: Secondary | ICD-10-CM

## 2013-02-10 DIAGNOSIS — S8000XA Contusion of unspecified knee, initial encounter: Secondary | ICD-10-CM | POA: Diagnosis not present

## 2013-02-10 DIAGNOSIS — M25569 Pain in unspecified knee: Secondary | ICD-10-CM

## 2013-02-10 DIAGNOSIS — S8001XA Contusion of right knee, initial encounter: Secondary | ICD-10-CM

## 2013-02-10 NOTE — Progress Notes (Signed)
[facility] [address] [place] [address] [phone]  Date:  02/10/2013   Name:  Rebecca [name]   DOB:  Oct 01, 1927   MRN:  [id number]  PCP:  Kimber Relic, MD    Chief Complaint: Leg Injury   History of Present Illness:  Rebecca [name] is a 79 y.o. very pleasant female patient who presents with the following:  She lives at [employer].  She slipped on a wet spot at home last night.  She hurt her right knee- not sure of the exact mechanism of fall, but she went down onto the knee.  She has had a knee replacement several years ago.  She has been able to walk.  She is using her walker which was left over from her knee surgery but states that she does not really need it.  She did not hit her head or have any other injury.  History and chart review reveals that she has fallen a few times in the last months.    Patient Active Problem List   Diagnosis Date Noted  . Cervicalgia 01/11/2013  . Personal history of fall 11/30/2012  . Biceps tendon rupture 11/30/2012  . Anemia, unspecified 11/30/2012  . Other abnormal blood chemistry 09/19/2012  . Memory loss 09/19/2012  . Blood in stool 09/19/2012  . Impacted cerumen 09/19/2012  . Hearing decreased 09/19/2012  . Senile ecchymosis 09/14/2012  . Other acquired deformity of toe 12/16/2011  . SHOULDER PAIN, LEFT 09/03/2009  . CLOS FX C7 VERTEBRA W/O MENTION SP CRD INJURY 09/03/2009  . Plantar wart 03/06/2009  . CAROTID BRUIT 02/10/2008  . OSTEOARTHRITIS 05/05/2007  . TREMOR, ESSENTIAL 02/03/2007  . LOW BACK PAIN 02/03/2007  . Senile osteoporosis 01/11/2007  . COLONIC POLYPS 12/08/2006  . HYPOTHYROIDISM 12/08/2006  . HEMORRHOIDS, INTERNAL W/O COMPLICATION 12/08/2006  . DIVERTICULOSIS, COLON 12/08/2006  . SEIZURE DISORDER 12/08/2006  . DIZZINESS OR VERTIGO 12/08/2006    Past Medical History  Diagnosis Date  . Osteoporosis 02/2011  . Seizures   . DJD (degenerative joint disease)   . Thyroid disease    . Diverticulosis 02/2011  . Hemorrhoids 02/2011  . Dizziness   . Low back pain   . Mitral valve problem thickened    thickened  . Unspecified hypothyroidism 01/2011  . 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 Vitamin D deficiency 01/2011  . Anemia, unspecified 03/18/2011  . Anxiety state, unspecified 01/2011  . 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 Pain in joint, site unspecified 01/2012  . Sacroiliitis, not elsewhere classified 05/2011  . Dysuria 06/17/2011  . Other abnormal blood chemistry 0/30/2012  . Blood in stool 06/15/2012  . Other acquired deformity of toe 12/16/2011  . Corns and callosities 07/01/2011    Past Surgical History  Procedure Laterality Date  . Cervical laminectomy  2005  . Bletheroplasty    . Hammer toe surgery  2013    right 2nd toe Dr. Sherilyn Cooter  . Joint replacement Bilateral 2002 Right, 1992 Left    knees  . Cataract extraction w/ intraocular lens  implant, bilateral  2010    History  Substance Use Topics  . Smoking status: Former Smoker    Quit date: 09/14/1988  . Smokeless tobacco: Never Used  . Alcohol Use: 0.6 oz/week    1 Glasses of wine per week     Comment: twice a month    Family History  Problem Relation Age  of Onset  . Alzheimer's disease Sister   . Emphysema Sister     Allergies  Allergen Reactions  . Morphine And Related   . Sulfa Antibiotics     Medication list has been reviewed and updated.  Current Outpatient Prescriptions on File Prior to Visit  Medication Sig Dispense Refill  . acetaminophen (ARTHRITIS PAIN RELIEF) 650 MG CR tablet Take 650 mg by mouth. Take one as needed for pain      . acetaminophen (TYLENOL) 500 MG tablet Take 500 mg by mouth. Take one as needed      . Calcium Carb-Cholecalciferol (CALCIUM 500 +D PO) Take by mouth. Take one twice daily      . cetirizine (KLS ALLER-TEC) 10 MG tablet Take 10 mg by mouth daily. As needed for allergies      . clonazePAM  (KLONOPIN) 0.5 MG tablet Take one tablet by mouth twice daily as needed for nerves      . cyclobenzaprine (FLEXERIL) 5 MG tablet One up to 3 times daily to relax muscles.  50 tablet  4  . levothyroxine (SYNTHROID, LEVOTHROID) 100 MCG tablet Take 1 tablet (100 mcg total) by mouth daily before breakfast. For thyroid  90 tablet  3  . meloxicam (MOBIC) 15 MG tablet Take 15 mg by mouth. Take one tablet daily to help arthritis      . Multiple Vitamin (MULTIVITAMINS PO) Take by mouth. Take one tablet daily      . amoxicillin (AMOXIL) 500 MG capsule Take 500 mg by mouth. Take 4 capsules one hour prior to dental appointment      . phenytoin (DILANTIN) 100 MG ER capsule 1 in the am and 3 at bedtime  360 capsule  3  . Pseudoephedrine-APAP-DM (TYLENOL COLD/FLU SEVERE DAY PO) Take by mouth. Take one as needed      . traMADol (ULTRAM) 50 MG tablet Take 50 mg by mouth. Take one every 4-6 hours as needed for pain       No current facility-administered medications on file prior to visit.    Review of Systems:  As per HPI- otherwise negative.   Physical Examination: Filed Vitals:   02/10/13 1305  BP: 100/62  Pulse: 86  Temp: 98.3 F (36.8 C)  Resp: 17   Filed Vitals:   02/10/13 1305  Height: 5\' 2"  (1.575 m)  Weight: 134 lb (60.782 kg)   Body mass index is 24.5 kg/(m^2). Ideal Body Weight: Weight in (lb) to have BMI = 25: 136.4 Of note today's BP reading is typical for her per chart review  GEN: WDWN, NAD, Non-toxic, A & O x 3, looks well, some difficulty with long- term memory HEENT: Atraumatic, Normocephalic. Neck supple. No masses, No LAD. Ears and Nose: No external deformity. CV: RRR, No M/G/R. No JVD. No thrill. No extra heart sounds. PULM: CTA B, no wheezes, crackles, rhonchi. No retractions. No resp. distress. No accessory muscle use. EXTR: No c/c/e NEURO Normal gait for age.   She has her walker with her but does not use it all the time.   PSYCH: Normally interactive. Conversant. Not  depressed or anxious appearing.  Calm demeanor.  Right knee: large well -healed scar from surgical incision.  She has normal ROM of the knee, and the knee is stable.  No swelling, bruise or effusion.    [facility] reading (PRIMARY) by  Dr. Patsy Lager. Right knee: hardware (total knee) in place.  No evidence of patellar fracture.    EXAM: RIGHT KNEE -  COMPLETE 4+ VIEW  COMPARISON: None.  FINDINGS: Post total knee replacement. The proximal and distal extent of the knee replacement is not completely visualized in both a lateral and frontal position. On the images submitted, no fracture or dislocation is noted. If there are persistent symptoms, followup imaging to include the proximal and distal aspect in at least 2 projections is recommended.  IMPRESSION: No fracture noted. Please see above discussion  Assessment and Plan: Knee pain, acute, right - Plan: DG Knee Complete 4 Views Right  Knee contusion, right, initial encounter  No apparent fracture.  She is actually walking well without her walker.  Her son came to get her and we discussed in detail. At this time I do not see any injury beyond a contusion.  I am concerned that she has apparently fallen a few times lately.  Discussed ways to help avoid falls at home.   Called and LMOM with her son Onalee Hua regarding the x-ray report above.  We are glad to repeat her films if any sx continue   Signed Abbe Amsterdam, MD

## 2013-02-10 NOTE — Patient Instructions (Addendum)
Please let me know if you continue to have pain in your knee.  You may go back to exercise/ balance class.  Please remove all throw rugs/ mats from your home.

## 2013-02-28 DIAGNOSIS — M542 Cervicalgia: Secondary | ICD-10-CM | POA: Diagnosis not present

## 2013-03-01 DIAGNOSIS — Z961 Presence of intraocular lens: Secondary | ICD-10-CM | POA: Diagnosis not present

## 2013-03-01 DIAGNOSIS — H52209 Unspecified astigmatism, unspecified eye: Secondary | ICD-10-CM | POA: Diagnosis not present

## 2013-03-15 DIAGNOSIS — M7150 Other bursitis, not elsewhere classified, unspecified site: Secondary | ICD-10-CM | POA: Diagnosis not present

## 2013-03-15 DIAGNOSIS — M79609 Pain in unspecified limb: Secondary | ICD-10-CM | POA: Diagnosis not present

## 2013-03-15 DIAGNOSIS — IMO0002 Reserved for concepts with insufficient information to code with codable children: Secondary | ICD-10-CM | POA: Diagnosis not present

## 2013-03-15 DIAGNOSIS — D2370 Other benign neoplasm of skin of unspecified lower limb, including hip: Secondary | ICD-10-CM | POA: Diagnosis not present

## 2013-03-15 DIAGNOSIS — M2040 Other hammer toe(s) (acquired), unspecified foot: Secondary | ICD-10-CM | POA: Diagnosis not present

## 2013-03-22 ENCOUNTER — Encounter: Payer: Self-pay | Admitting: Internal Medicine

## 2013-03-22 ENCOUNTER — Non-Acute Institutional Stay: Payer: Medicare Other | Admitting: Internal Medicine

## 2013-03-22 VITALS — BP 120/62 | HR 68 | Ht 62.0 in | Wt 135.0 lb

## 2013-03-22 DIAGNOSIS — E039 Hypothyroidism, unspecified: Secondary | ICD-10-CM

## 2013-03-22 DIAGNOSIS — R413 Other amnesia: Secondary | ICD-10-CM | POA: Diagnosis not present

## 2013-03-22 DIAGNOSIS — M25561 Pain in right knee: Secondary | ICD-10-CM

## 2013-03-22 DIAGNOSIS — M542 Cervicalgia: Secondary | ICD-10-CM

## 2013-03-22 DIAGNOSIS — M25569 Pain in unspecified knee: Secondary | ICD-10-CM | POA: Diagnosis not present

## 2013-03-22 DIAGNOSIS — D649 Anemia, unspecified: Secondary | ICD-10-CM

## 2013-03-22 NOTE — Patient Instructions (Signed)
Stop clonazepam

## 2013-03-22 NOTE — Progress Notes (Signed)
Subjective:    Patient ID: Rebecca [name], female    DOB: 02-08-1928, 79 y.o.   MRN: [id number]  Chief Complaint  Patient presents with  . Medical Managment of Chronic Issues    memory, neck pain, right shoulder pain.  Saw Dr. Darrelyn Hillock  . Fall    fell 02/10/13 right knee  Urgent Care    HPI Went to foot center on [address]. Got shots in the foot for arthritis.  HYPOTHYROIDISM: stable  Pain in joint, lower leg, right: seen in Urgent Care 02/10/13 after a fall. Better now.  Memory loss: chronic.   Anemia: needs to be rechecked    Current Outpatient Prescriptions on File Prior to Visit  Medication Sig Dispense Refill  . acetaminophen (TYLENOL) 500 MG tablet Take 500 mg by mouth. Take one as needed      . amoxicillin (AMOXIL) 500 MG capsule Take 500 mg by mouth. Take 4 capsules one hour prior to dental appointment      . Calcium Carb-Cholecalciferol (CALCIUM 500 +D PO) Take by mouth. Take one twice daily      . cetirizine (KLS ALLER-TEC) 10 MG tablet Take 10 mg by mouth daily. As needed for allergies      . clonazePAM (KLONOPIN) 0.5 MG tablet Take one tablet by mouth twice daily as needed for nerves      . levothyroxine (SYNTHROID, LEVOTHROID) 100 MCG tablet Take 1 tablet (100 mcg total) by mouth daily before breakfast. For thyroid  90 tablet  3  . meloxicam (MOBIC) 15 MG tablet Take 15 mg by mouth. Take one tablet daily to help arthritis      . Multiple Vitamin (MULTIVITAMINS PO) Take by mouth. Take one tablet daily      . phenytoin (DILANTIN) 100 MG ER capsule 1 in the am and 3 at bedtime  360 capsule  3  . Pseudoephedrine-APAP-DM (TYLENOL COLD/FLU SEVERE DAY PO) Take by mouth. Take one as needed      . traMADol (ULTRAM) 50 MG tablet Take 50 mg by mouth. Take one every 4-6 hours as needed for pain       No current facility-administered medications on file prior to visit.    Review of Systems  Constitutional: Negative.   HENT:       Bilateral hearing loss  Eyes: Negative.    Respiratory:       History of dyspnea on exertion  Cardiovascular: Negative.   Gastrointestinal:       Blood noted in stool and toilet paper occasionally.  Endocrine:       History of hypothyroidism  Genitourinary: Negative.   Musculoskeletal:       Osteoarthritis with complaints of pain in multiple areas. Neck and right shoulder pains. Sharp pain in the right shoulder and arm when she abducts and rotated at the shoulder.  Skin:       Senile ecchymoses  Allergic/Immunologic: Negative.   Neurological: Negative.   Hematological: Negative.   Psychiatric/Behavioral:       Chronic anxiety. Memory deficits.       Objective:BP 120/62  Pulse 68  Ht 5\' 2"  (1.575 m)  Wt 135 lb (61.236 kg)  BMI 24.69 kg/m2    Physical Exam  Constitutional: She is oriented to person, place, and time. She appears well-developed and well-nourished. No distress.  HENT:  Bilateral hearing loss.  Eyes:  Lens implants in both eyes  Neck: Normal range of motion. Neck supple. No JVD present. No tracheal deviation present. No  thyromegaly present.  Cardiovascular: Normal rate, regular rhythm, normal heart sounds and intact distal pulses.  Exam reveals no gallop and no friction rub.   No murmur heard. Pulmonary/Chest: Effort normal and breath sounds normal. No respiratory distress. She has no wheezes. She exhibits no tenderness.  Abdominal: Soft. Bowel sounds are normal. She exhibits no distension and no mass. There is no tenderness.  Genitourinary: Guaiac negative stool.  Hemorrhoids. Normal sphincter tone. Stools are normal brown color.  Musculoskeletal: Normal range of motion. She exhibits no edema and no tenderness.  Hammertoes at the left second and third. Right second toe overlaps the great toe. SP surgical repair of the right 2nd toe.  Shoulders sore. Painful in the right shoulder when she lifts overhead and rotates at the shpoulder.[facility] July 2014. Unstable gait.  Weaker grip of the right hand.   Lymphadenopathy:    She has no cervical adenopathy.  Neurological: She is alert and oriented to person, place, and time. No cranial nerve deficit. Coordination normal.  No loss of grip strength. Loss of memory.  Skin: Skin is warm and dry. No rash noted. No erythema. No pallor.  Psychiatric: She has a normal mood and affect. Her behavior is normal. Thought content normal.    LAB REVIEW  01/03/13 CBC: NORMAL  Retic 1.3%  CMP: normal  TSH 1.106  Dilantin: 21.       Assessment & Plan:  HYPOTHYROIDISM: recheck lab  Pain in joint, lower leg, right: resolved  Memory loss: do MMSE next time  Cervicalgia: mild  Anemia, unspecified: recheck next visit

## 2013-03-23 DIAGNOSIS — M2040 Other hammer toe(s) (acquired), unspecified foot: Secondary | ICD-10-CM | POA: Diagnosis not present

## 2013-04-14 ENCOUNTER — Ambulatory Visit (INDEPENDENT_AMBULATORY_CARE_PROVIDER_SITE_OTHER): Payer: Medicare Other | Admitting: Family Medicine

## 2013-04-14 ENCOUNTER — Telehealth: Payer: Self-pay | Admitting: Radiology

## 2013-04-14 VITALS — BP 94/60 | HR 51 | Temp 97.8°F | Resp 16 | Ht 61.25 in | Wt 127.6 lb

## 2013-04-14 DIAGNOSIS — R059 Cough, unspecified: Secondary | ICD-10-CM | POA: Diagnosis not present

## 2013-04-14 DIAGNOSIS — R05 Cough: Secondary | ICD-10-CM | POA: Diagnosis not present

## 2013-04-14 DIAGNOSIS — M542 Cervicalgia: Secondary | ICD-10-CM | POA: Diagnosis not present

## 2013-04-14 MED ORDER — HYDROCODONE-HOMATROPINE 5-1.5 MG/5ML PO SYRP: 5.0000 mL | ORAL_SOLUTION | Freq: Three times a day (TID) | ORAL | Status: DC | PRN

## 2013-04-14 MED ORDER — AZITHROMYCIN 250 MG PO TABS: ORAL_TABLET | ORAL | Status: DC

## 2013-04-14 NOTE — Patient Instructions (Signed)
You can go for the CT scan as a walk in at [facility] tomorrow from 8-5, you will not need an appt.

## 2013-04-14 NOTE — Progress Notes (Signed)
79 yo woman with two problems:  1 week of cough and congestion without fever  3 months of neck pain after falling in bathroom,  with crepitus ensuing and h/o C7 neck fx.  No improvement.  Chronic right hand numbness.  Orthopedic evaluation negative including plain films.  Patient was at [facility] where she is somewhat lonely. She does play bridge and her son comes to visit her regularly.  No falls since.  Patient has sharp pains when turning head.  Objective: No acute distress, color good, good eye contact, alert and appropriate Patient seen with her son Onalee Hua HEENT: Unremarkable Neck: Decreased range of motion with rotation about 30 each way. Palpation of the neck reveals "clunk" when she turns her neck or looks up. This is associated with wincing pain. Patient is moving her extremities normally opening closing her hands and raising her arms. Neck is tender over the lower cervical spine with some slight swelling in the paraspinal areas. Chest: Few rhonchi bilaterally Heart: Regular with a 1/6 systolic murmur Skin: Unremarkable  Assessment: URI with bronchitis, mild and persistent. Of particular concern about the neck injury. Given the fact that she's had a C7 fracture in the past, and the palpation of significant crepitus, there may be subluxation versus a fracture.  Plan: Z-Pak and hydrocodone cough syrup for the URI. CT scan for the neck injury. There is no bony abnormality, I would be inclined to refer the patient to [facility] for physical therapy followup.

## 2013-04-14 NOTE — Telephone Encounter (Signed)
error 

## 2013-04-15 ENCOUNTER — Ambulatory Visit
Admission: RE | Admit: 2013-04-15 | Discharge: 2013-04-15 | Disposition: A | Discharge status: Home / Self Care | Payer: PRIVATE HEALTH INSURANCE | Source: Ambulatory Visit | Attending: Family Medicine | Admitting: Family Medicine

## 2013-04-15 DIAGNOSIS — M542 Cervicalgia: Secondary | ICD-10-CM | POA: Diagnosis not present

## 2013-04-15 DIAGNOSIS — S0993XA Unspecified injury of face, initial encounter: Secondary | ICD-10-CM | POA: Diagnosis not present

## 2013-04-21 ENCOUNTER — Telehealth: Payer: Self-pay

## 2013-04-21 ENCOUNTER — Other Ambulatory Visit: Payer: Self-pay | Admitting: Family Medicine

## 2013-04-21 DIAGNOSIS — M542 Cervicalgia: Secondary | ICD-10-CM

## 2013-04-21 MED ORDER — PREDNISONE 20 MG PO TABS: 40.0000 mg | ORAL_TABLET | Freq: Every day | ORAL | Status: DC

## 2013-04-21 NOTE — Telephone Encounter (Signed)
I want her to first meet with Rebecca [name] as he is the best to outline a therapeutic intervention

## 2013-04-21 NOTE — Telephone Encounter (Signed)
[name] called stating that a ct scan was done for patient on Friday and they we were supposed to call both him at [phone] and the patient [facility] 45 [facility]   But no call has been received please call [phone]

## 2013-04-21 NOTE — Telephone Encounter (Signed)
Will you review the scan? Advised son it has been sent to you for review, and we will call.

## 2013-04-21 NOTE — Telephone Encounter (Signed)
Patient called and says Dr Milus Glazier was going to refer her for physical therapy but she wants to know if she can just use the physical therapist at [facility] where she is. She would prefer this so she wont have to worry about transportation. Cb# [phone]

## 2013-04-22 NOTE — Telephone Encounter (Signed)
Spoke to pt she is ok with going with your suggestion.

## 2013-04-25 ENCOUNTER — Telehealth: Payer: Self-pay

## 2013-04-25 NOTE — Telephone Encounter (Signed)
Called her. Left message for her to call back.  

## 2013-04-25 NOTE — Telephone Encounter (Signed)
Dr. Elbert Ewings - Patient left a message on the answering machine over the weekend.  She says you are referring her to another doctor, but that something else has happened and she is in a lot of pain.  Wants [place] to call her back at [phone]

## 2013-04-26 NOTE — Telephone Encounter (Signed)
Called again. Left message for her to call back if she still needs to speak to [place] about her back, otherwise I will disregard the call.

## 2013-05-04 DIAGNOSIS — R293 Abnormal posture: Secondary | ICD-10-CM | POA: Diagnosis not present

## 2013-05-04 DIAGNOSIS — M542 Cervicalgia: Secondary | ICD-10-CM | POA: Diagnosis not present

## 2013-05-04 DIAGNOSIS — G40909 Epilepsy, unspecified, not intractable, without status epilepticus: Secondary | ICD-10-CM | POA: Diagnosis not present

## 2013-05-06 DIAGNOSIS — G40909 Epilepsy, unspecified, not intractable, without status epilepticus: Secondary | ICD-10-CM | POA: Diagnosis not present

## 2013-05-06 DIAGNOSIS — M542 Cervicalgia: Secondary | ICD-10-CM | POA: Diagnosis not present

## 2013-05-06 DIAGNOSIS — R293 Abnormal posture: Secondary | ICD-10-CM | POA: Diagnosis not present

## 2013-05-09 DIAGNOSIS — R293 Abnormal posture: Secondary | ICD-10-CM | POA: Diagnosis not present

## 2013-05-09 DIAGNOSIS — G40909 Epilepsy, unspecified, not intractable, without status epilepticus: Secondary | ICD-10-CM | POA: Diagnosis not present

## 2013-05-09 DIAGNOSIS — M542 Cervicalgia: Secondary | ICD-10-CM | POA: Diagnosis not present

## 2013-05-13 DIAGNOSIS — G40909 Epilepsy, unspecified, not intractable, without status epilepticus: Secondary | ICD-10-CM | POA: Diagnosis not present

## 2013-05-13 DIAGNOSIS — M542 Cervicalgia: Secondary | ICD-10-CM | POA: Diagnosis not present

## 2013-05-13 DIAGNOSIS — R293 Abnormal posture: Secondary | ICD-10-CM | POA: Diagnosis not present

## 2013-05-16 DIAGNOSIS — R293 Abnormal posture: Secondary | ICD-10-CM | POA: Diagnosis not present

## 2013-05-16 DIAGNOSIS — G40909 Epilepsy, unspecified, not intractable, without status epilepticus: Secondary | ICD-10-CM | POA: Diagnosis not present

## 2013-05-16 DIAGNOSIS — M542 Cervicalgia: Secondary | ICD-10-CM | POA: Diagnosis not present

## 2013-05-18 DIAGNOSIS — M542 Cervicalgia: Secondary | ICD-10-CM | POA: Diagnosis not present

## 2013-05-18 DIAGNOSIS — R293 Abnormal posture: Secondary | ICD-10-CM | POA: Diagnosis not present

## 2013-05-18 DIAGNOSIS — G40909 Epilepsy, unspecified, not intractable, without status epilepticus: Secondary | ICD-10-CM | POA: Diagnosis not present

## 2013-05-23 DIAGNOSIS — Z23 Encounter for immunization: Secondary | ICD-10-CM | POA: Diagnosis not present

## 2013-05-24 DIAGNOSIS — R293 Abnormal posture: Secondary | ICD-10-CM | POA: Diagnosis not present

## 2013-05-24 DIAGNOSIS — M542 Cervicalgia: Secondary | ICD-10-CM | POA: Diagnosis not present

## 2013-05-26 DIAGNOSIS — M542 Cervicalgia: Secondary | ICD-10-CM | POA: Diagnosis not present

## 2013-05-26 DIAGNOSIS — R293 Abnormal posture: Secondary | ICD-10-CM | POA: Diagnosis not present

## 2013-05-31 DIAGNOSIS — R293 Abnormal posture: Secondary | ICD-10-CM | POA: Diagnosis not present

## 2013-05-31 DIAGNOSIS — M542 Cervicalgia: Secondary | ICD-10-CM | POA: Diagnosis not present

## 2013-06-02 DIAGNOSIS — M542 Cervicalgia: Secondary | ICD-10-CM | POA: Diagnosis not present

## 2013-06-02 DIAGNOSIS — R293 Abnormal posture: Secondary | ICD-10-CM | POA: Diagnosis not present

## 2013-06-07 DIAGNOSIS — R293 Abnormal posture: Secondary | ICD-10-CM | POA: Diagnosis not present

## 2013-06-07 DIAGNOSIS — M542 Cervicalgia: Secondary | ICD-10-CM | POA: Diagnosis not present

## 2013-06-09 DIAGNOSIS — M542 Cervicalgia: Secondary | ICD-10-CM | POA: Diagnosis not present

## 2013-06-09 DIAGNOSIS — R293 Abnormal posture: Secondary | ICD-10-CM | POA: Diagnosis not present

## 2013-06-14 DIAGNOSIS — M542 Cervicalgia: Secondary | ICD-10-CM | POA: Diagnosis not present

## 2013-06-14 DIAGNOSIS — R293 Abnormal posture: Secondary | ICD-10-CM | POA: Diagnosis not present

## 2013-06-16 DIAGNOSIS — M542 Cervicalgia: Secondary | ICD-10-CM | POA: Diagnosis not present

## 2013-06-16 DIAGNOSIS — R293 Abnormal posture: Secondary | ICD-10-CM | POA: Diagnosis not present

## 2013-06-28 DIAGNOSIS — R41841 Cognitive communication deficit: Secondary | ICD-10-CM | POA: Diagnosis not present

## 2013-06-28 DIAGNOSIS — M542 Cervicalgia: Secondary | ICD-10-CM | POA: Diagnosis not present

## 2013-06-28 DIAGNOSIS — R293 Abnormal posture: Secondary | ICD-10-CM | POA: Diagnosis not present

## 2013-06-30 DIAGNOSIS — M542 Cervicalgia: Secondary | ICD-10-CM | POA: Diagnosis not present

## 2013-06-30 DIAGNOSIS — R41841 Cognitive communication deficit: Secondary | ICD-10-CM | POA: Diagnosis not present

## 2013-06-30 DIAGNOSIS — R293 Abnormal posture: Secondary | ICD-10-CM | POA: Diagnosis not present

## 2013-07-05 ENCOUNTER — Ambulatory Visit: Payer: Self-pay | Admitting: Internal Medicine

## 2013-07-05 ENCOUNTER — Encounter: Payer: Medicare Other | Admitting: Internal Medicine

## 2013-07-11 DIAGNOSIS — Z79899 Other long term (current) drug therapy: Secondary | ICD-10-CM | POA: Diagnosis not present

## 2013-07-11 DIAGNOSIS — E039 Hypothyroidism, unspecified: Secondary | ICD-10-CM | POA: Diagnosis not present

## 2013-07-11 DIAGNOSIS — R569 Unspecified convulsions: Secondary | ICD-10-CM | POA: Diagnosis not present

## 2013-07-11 DIAGNOSIS — D649 Anemia, unspecified: Secondary | ICD-10-CM | POA: Diagnosis not present

## 2013-07-11 LAB — HEPATIC FUNCTION PANEL
ALK PHOS: 66 U/L (ref 25–125)
ALT: 21 U/L (ref 7–35)
AST: 26 U/L (ref 13–35)
Bilirubin, Total: 0.4 mg/dL

## 2013-07-11 LAB — BASIC METABOLIC PANEL
BUN: 24 mg/dL — AB (ref 4–21)
Creatinine: 0.8 mg/dL (ref 0.5–1.1)
Glucose: 110 mg/dL
Potassium: 3.8 mmol/L (ref 3.4–5.3)
Sodium: 142 mmol/L (ref 137–147)

## 2013-07-11 LAB — CBC AND DIFFERENTIAL
HEMATOCRIT: 41 % (ref 36–46)
HEMOGLOBIN: 13.7 g/dL (ref 12.0–16.0)
Platelets: 231 10*3/uL (ref 150–399)
WBC: 6.9 10^3/mL

## 2013-07-11 LAB — TSH: TSH: 5.1 u[IU]/mL (ref 0.41–5.90)

## 2013-07-12 ENCOUNTER — Non-Acute Institutional Stay: Payer: Medicare Other | Admitting: Internal Medicine

## 2013-07-12 ENCOUNTER — Encounter: Payer: Self-pay | Admitting: Internal Medicine

## 2013-07-12 ENCOUNTER — Other Ambulatory Visit: Payer: Self-pay | Admitting: Internal Medicine

## 2013-07-12 ENCOUNTER — Telehealth: Payer: Self-pay

## 2013-07-12 VITALS — BP 114/60 | HR 72 | Wt 129.0 lb

## 2013-07-12 DIAGNOSIS — N6310 Unspecified lump in the right breast, unspecified quadrant: Secondary | ICD-10-CM

## 2013-07-12 DIAGNOSIS — N63 Unspecified lump in unspecified breast: Secondary | ICD-10-CM | POA: Diagnosis not present

## 2013-07-12 NOTE — Telephone Encounter (Signed)
Called patient with appointments, had patient get pen and paper to write down [facility] 07[facility]25/13 at 10:30, Chest x-ray at [place] at [address] walk-in doesn't need appointment. Explain to patient locations gave address and phone number.

## 2013-07-13 ENCOUNTER — Telehealth: Payer: Self-pay

## 2013-07-13 NOTE — Telephone Encounter (Signed)
Received phone call from [facility] clinic nurse, patient had mixed several different creams and put on her face. Now her face looks like it is "sunburn", red and dry, wonder what she could use. Asked Lestine Box, NP. Use Hydrocortisone 0.05% once a day for 2 days only and cool compresses.

## 2013-07-19 ENCOUNTER — Non-Acute Institutional Stay: Payer: Medicare Other | Admitting: Internal Medicine

## 2013-07-19 ENCOUNTER — Encounter: Payer: Self-pay | Admitting: Internal Medicine

## 2013-07-19 ENCOUNTER — Telehealth: Payer: Self-pay

## 2013-07-19 VITALS — BP 100/62 | HR 80 | Wt 126.0 lb

## 2013-07-19 DIAGNOSIS — D649 Anemia, unspecified: Secondary | ICD-10-CM

## 2013-07-19 DIAGNOSIS — M25569 Pain in unspecified knee: Secondary | ICD-10-CM | POA: Diagnosis not present

## 2013-07-19 DIAGNOSIS — R569 Unspecified convulsions: Secondary | ICD-10-CM

## 2013-07-19 DIAGNOSIS — E039 Hypothyroidism, unspecified: Secondary | ICD-10-CM | POA: Diagnosis not present

## 2013-07-19 DIAGNOSIS — R7309 Other abnormal glucose: Secondary | ICD-10-CM | POA: Diagnosis not present

## 2013-07-19 DIAGNOSIS — R739 Hyperglycemia, unspecified: Secondary | ICD-10-CM

## 2013-07-19 DIAGNOSIS — R413 Other amnesia: Secondary | ICD-10-CM

## 2013-07-19 DIAGNOSIS — M542 Cervicalgia: Secondary | ICD-10-CM | POA: Diagnosis not present

## 2013-07-19 MED ORDER — PHENYTOIN SODIUM EXTENDED 100 MG PO CAPS: ORAL_CAPSULE | ORAL | Status: DC

## 2013-07-19 NOTE — Telephone Encounter (Signed)
New patient referral form, office notes, lab, MMSE, demographics faxed to [phone]

## 2013-07-19 NOTE — Progress Notes (Signed)
Passed clock drawing 

## 2013-07-19 NOTE — Progress Notes (Signed)
Patient ID: Rebecca [name], female   DOB: 01/12/1928, 80 y.o.   MRN: [id number]    Location:  [facility]   Place of Service: Clinic (12)    Allergies  Allergen Reactions  . Morphine And Related   . Sulfa Antibiotics     Chief Complaint  Patient presents with  . Medical Managment of Chronic Issues    thyroid, memory, anemia    HPI:  Memory loss; She believes it is getting worse. Forgetting to go places. She would like to see a neurologist.  Anemia, unspecified: resolved  Hyperglycemia: 110 on 07/11/13.  HYPOTHYROIDISM: TSH was 5/049 on 2/223/15.  Cervicalgia: like lightening bolt shooting in neck sometimes. Hx of Fx C7 in 2011. Sometimes has tingling in fingers.  Still distressed about what she terms as a bad result from surgery on the right 2nd toe to correct hammer toe.   Sometimes she feels like there is crumbled up paper around the right foot.  Right hand is numb when she wakes in the morning, but gets better after about 30 min.  Had a reaction on her face from new make-up. Better after applying cortisone cream the last few days.  Seizure: none recently. Problem started after a head injury.    Medications: Patient's Medications  New Prescriptions   No medications on file  Previous Medications   ACETAMINOPHEN (TYLENOL) 500 MG TABLET    Take 500 mg by mouth. Take one as needed   AMOXICILLIN (AMOXIL) 500 MG CAPSULE    Take 500 mg by mouth. Take 4 capsules one hour prior to dental appointment   CALCIUM CARB-CHOLECALCIFEROL (CALCIUM 500 +D PO)    Take by mouth. Take one twice daily   CETIRIZINE (KLS ALLER-TEC) 10 MG TABLET    Take 10 mg by mouth daily. As needed for allergies   CHOLECALCIFEROL (VITAMIN D-3) 5000 UNITS TABS    Take by mouth. Take one twice daily   HYDROCODONE-HOMATROPINE (HYCODAN) 5-1.5 MG/5ML SYRUP    Take 5 mLs by mouth every 8 (eight) hours as needed for cough.   LEVOTHYROXINE (SYNTHROID, LEVOTHROID) 100 MCG TABLET    Take 1 tablet (100 mcg  total) by mouth daily before breakfast. For thyroid   MELOXICAM (MOBIC) 15 MG TABLET    Take 15 mg by mouth. Take one tablet daily to help arthritis   MULTIPLE VITAMIN (MULTIVITAMINS PO)    Take by mouth. Take one tablet daily   PHENYTOIN (DILANTIN) 100 MG ER CAPSULE    1 in the am and 3 at bedtime   PSEUDOEPHEDRINE-APAP-DM (TYLENOL COLD/FLU SEVERE DAY PO)    Take by mouth. Take one as needed   TRAMADOL (ULTRAM) 50 MG TABLET    Take 50 mg by mouth. Take one every 4-6 hours as needed for pain  Modified Medications   No medications on file  Discontinued Medications   PREDNISONE (DELTASONE) 20 MG TABLET    Take 2 tablets (40 mg total) by mouth daily.     Review of Systems  Constitutional: Negative.   HENT:       Bilateral hearing loss  Eyes: Negative.   Respiratory:       History of dyspnea on exertion  Cardiovascular: Negative.   Gastrointestinal:       Blood noted in stool and toilet paper occasionally.  Endocrine:       History of hypothyroidism  Genitourinary: Negative.   Musculoskeletal:       Osteoarthritis with complaints of pain in multiple  areas. Neck and right shoulder pains. Sharp pain in the right shoulder and arm when she abducts and rotated at the shoulder.  Skin:       Senile ecchymoses  Allergic/Immunologic: Negative.   Neurological: Positive for tremors.       Hx seizures.   Hematological: Negative.   Psychiatric/Behavioral:       Chronic anxiety. Memory deficits.    Filed Vitals:   07/19/13 0858  BP: 100/62  Pulse: 80  Weight: 126 lb (57.153 kg)   Physical Exam  Constitutional: She is oriented to person, place, and time. She appears well-developed and well-nourished. No distress.  HENT:  Bilateral hearing loss.  Eyes:  Lens implants in both eyes  Neck: Normal range of motion. Neck supple. No JVD present. No tracheal deviation present. No thyromegaly present.  Cardiovascular: Normal rate, regular rhythm, normal heart sounds and intact distal pulses.   Exam reveals no gallop and no friction rub.   No murmur heard. Pulmonary/Chest: Effort normal and breath sounds normal. No respiratory distress. She has no wheezes. She exhibits no tenderness.  Abdominal: Soft. Bowel sounds are normal. She exhibits no distension and no mass. There is no tenderness.  Genitourinary: Guaiac negative stool.  Hemorrhoids. Normal sphincter tone. Stools are normal brown color.  Musculoskeletal: Normal range of motion. She exhibits no edema and no tenderness.  Hammertoes at the left second and third. Right second toe overlaps the great toe. SP surgical repair of the right 2nd toe.  Shoulders sore. Painful in the right shoulder when she lifts overhead and rotates at the shpoulder.[facility] July 2014. Unstable gait.  Weaker grip of the right hand.  Lymphadenopathy:    She has no cervical adenopathy.  Neurological: She is alert and oriented to person, place, and time. No cranial nerve deficit. Coordination normal.  No loss of grip strength. Loss of memory. Bilateral tremor.  Skin: Skin is warm and dry. No rash noted. No erythema. No pallor.  Psychiatric: She has a normal mood and affect. Her behavior is normal. Thought content normal.     Labs reviewed: Nursing Home on 07/19/2013  Component Date Value Ref Range Status  . Hemoglobin 07/11/2013 13.7  12.0 - 16.0 g/dL Final  . HCT 07/11/2013 41  36 - 46 % Final  . Platelets 07/11/2013 231  150 - 399 K/L Final  . WBC 07/11/2013 6.9   Final  . Glucose 07/11/2013 110   Final  . BUN 07/11/2013 24* 4 - 21 mg/dL Final  . Creatinine 07/11/2013 0.8  0.5 - 1.1 mg/dL Final  . Potassium 07/11/2013 3.8  3.4 - 5.3 mmol/L Final  . Sodium 07/11/2013 142  137 - 147 mmol/L Final  . Alkaline Phosphatase 07/11/2013 66  25 - 125 U/L Final  . ALT 07/11/2013 21  7 - 35 U/L Final  . AST 07/11/2013 26  13 - 35 U/L Final  . Bilirubin, Total 07/11/2013 0.4   Final  . TSH 07/11/2013 5.10  0.41 - 5.90 uIU/mL Final       Assessment/Plan  1. Memory loss  Refer to Neurology  2. Anemia, unspecified resolved  3. Hyperglycemia Mild. Diet controlled.  4. HYPOTHYROIDISM Mild elevation in TSH  5. Cervicalgia Intermittent pains in the neck  6. SEIZURE DISORDER None recently - phenytoin (DILANTIN) 100 MG ER capsule; 1 in the am and 2 at bedtime to prevent seizures  Dispense: 360 capsule; Refill: 3

## 2013-07-25 ENCOUNTER — Ambulatory Visit
Admission: RE | Admit: 2013-07-25 | Discharge: 2013-07-25 | Disposition: A | Discharge status: Home / Self Care | Payer: Medicare Other | Source: Ambulatory Visit | Attending: Internal Medicine | Admitting: Internal Medicine

## 2013-07-25 DIAGNOSIS — R928 Other abnormal and inconclusive findings on diagnostic imaging of breast: Secondary | ICD-10-CM | POA: Diagnosis not present

## 2013-07-25 DIAGNOSIS — N6310 Unspecified lump in the right breast, unspecified quadrant: Secondary | ICD-10-CM

## 2013-07-25 DIAGNOSIS — R091 Pleurisy: Secondary | ICD-10-CM | POA: Diagnosis not present

## 2013-07-25 NOTE — Progress Notes (Signed)
Patient ID: Rebecca [name], female   DOB: June 20, 1927, 80 y.o.   MRN: [id number]    Location:  [facility]   Place of Service: Clinic (12)    Allergies  Allergen Reactions  . Morphine And Related   . Sulfa Antibiotics     Chief Complaint  Patient presents with  . Breast Mass    above right breast for several days    HPI:  She says she haas a lump superior right breast. She points to the costal sterna junction at about right 4th rib. Has been tender for several days.   Medications: Patient's Medications  New Prescriptions   No medications on file  Previous Medications   ACETAMINOPHEN (TYLENOL) 500 MG TABLET    Take 500 mg by mouth. Take one as needed   AMOXICILLIN (AMOXIL) 500 MG CAPSULE    Take 500 mg by mouth. Take 4 capsules one hour prior to dental appointment   CALCIUM CARB-CHOLECALCIFEROL (CALCIUM 500 +D PO)    Take by mouth. Take one twice daily   CETIRIZINE (KLS ALLER-TEC) 10 MG TABLET    Take 10 mg by mouth daily. As needed for allergies   CHOLECALCIFEROL (VITAMIN D-3) 5000 UNITS TABS    Take by mouth. Take one twice daily   HYDROCODONE-HOMATROPINE (HYCODAN) 5-1.5 MG/5ML SYRUP    Take 5 mLs by mouth every 8 (eight) hours as needed for cough.   LEVOTHYROXINE (SYNTHROID, LEVOTHROID) 100 MCG TABLET    Take 1 tablet (100 mcg total) by mouth daily before breakfast. For thyroid   MELOXICAM (MOBIC) 15 MG TABLET    Take 15 mg by mouth. Take one tablet daily to help arthritis   MULTIPLE VITAMIN (MULTIVITAMINS PO)    Take by mouth. Take one tablet daily   PSEUDOEPHEDRINE-APAP-DM (TYLENOL COLD/FLU SEVERE DAY PO)    Take by mouth. Take one as needed   TRAMADOL (ULTRAM) 50 MG TABLET    Take 50 mg by mouth. Take one every 4-6 hours as needed for pain  Modified Medications   Modified Medication Previous Medication   PHENYTOIN (DILANTIN) 100 MG ER CAPSULE phenytoin (DILANTIN) 100 MG ER capsule      1 in the am and 2 at bedtime to prevent seizures    1 in the am and 3 at bedtime   Discontinued Medications   AZITHROMYCIN (ZITHROMAX Z-PAK) 250 MG TABLET    Take as directed on pack   PREDNISONE (DELTASONE) 20 MG TABLET    Take 2 tablets (40 mg total) by mouth daily.     Review of Systems  Constitutional: Negative.   HENT:       Bilateral hearing loss  Eyes: Negative.   Respiratory:       History of dyspnea on exertion  Cardiovascular: Negative.   Gastrointestinal:       Blood noted in stool and toilet paper occasionally.  Endocrine:       History of hypothyroidism  Genitourinary: Negative.   Musculoskeletal:       Osteoarthritis with complaints of pain in multiple areas. Neck and right shoulder pains. Sharp pain in the right shoulder and arm when she abducts and rotated at the shoulder.  Skin:       Senile ecchymoses  Allergic/Immunologic: Negative.   Neurological: Negative.   Hematological: Negative.   Psychiatric/Behavioral:       Chronic anxiety. Memory deficits.    Filed Vitals:   07/12/13 0941  BP: 114/60  Pulse: 72  Weight: 129 lb (  58.514 kg)   Physical Exam  Constitutional: She is oriented to person, place, and time. She appears well-developed and well-nourished. No distress.  HENT:  Bilateral hearing loss.  Eyes:  Lens implants in both eyes  Neck: Normal range of motion. Neck supple. No JVD present. No tracheal deviation present. No thyromegaly present.  Cardiovascular: Normal rate, regular rhythm, normal heart sounds and intact distal pulses.  Exam reveals no gallop and no friction rub.   No murmur heard. Pulmonary/Chest: Effort normal and breath sounds normal. No respiratory distress. She has no wheezes. She exhibits no tenderness.  Tender in the right upper chest at costosternal junction.  Abdominal: Soft. Bowel sounds are normal. She exhibits no distension and no mass. There is no tenderness.  Genitourinary: Guaiac negative stool.  Hemorrhoids. Normal sphincter tone. Stools are normal brown color.  Musculoskeletal: Normal range of  motion. She exhibits no edema and no tenderness.  Hammertoes at the left second and third. Right second toe overlaps the great toe. SP surgical repair of the right 2nd toe.  Shoulders sore. Painful in the right shoulder when she lifts overhead and rotates at the [place].[facility] July 2014. Unstable gait.  Weaker grip of the right hand.  Lymphadenopathy:    She has no cervical adenopathy.  Neurological: She is alert and oriented to person, place, and time. No cranial nerve deficit. Coordination normal.  No loss of grip strength. Loss of memory.  Skin: Skin is warm and dry. No rash noted. No erythema. No pallor.  Psychiatric: She has a normal mood and affect. Her behavior is normal. Thought content normal.    Assessment/Plan  1. Lump of right breast I think this is costochondritis, but she should have an updated mammogram and CXR - DG Chest 2 View; Future - MM Digital Diagnostic Bilat; Future

## 2013-08-02 ENCOUNTER — Encounter: Payer: Self-pay | Admitting: Internal Medicine

## 2013-08-08 ENCOUNTER — Encounter: Payer: Self-pay | Admitting: Family

## 2013-08-08 ENCOUNTER — Ambulatory Visit (INDEPENDENT_AMBULATORY_CARE_PROVIDER_SITE_OTHER): Payer: Medicare Other | Admitting: Family

## 2013-08-08 VITALS — BP 96/64 | HR 88 | Temp 97.7°F | Ht 62.0 in | Wt 127.0 lb

## 2013-08-08 DIAGNOSIS — D649 Anemia, unspecified: Secondary | ICD-10-CM

## 2013-08-08 DIAGNOSIS — G8929 Other chronic pain: Secondary | ICD-10-CM | POA: Diagnosis not present

## 2013-08-08 DIAGNOSIS — M542 Cervicalgia: Secondary | ICD-10-CM

## 2013-08-08 DIAGNOSIS — M1990 Unspecified osteoarthritis, unspecified site: Secondary | ICD-10-CM

## 2013-08-08 DIAGNOSIS — E039 Hypothyroidism, unspecified: Secondary | ICD-10-CM

## 2013-08-08 DIAGNOSIS — R7309 Other abnormal glucose: Secondary | ICD-10-CM | POA: Diagnosis not present

## 2013-08-08 DIAGNOSIS — R739 Hyperglycemia, unspecified: Secondary | ICD-10-CM

## 2013-08-08 LAB — COMPREHENSIVE METABOLIC PANEL
ALK PHOS: 72 U/L (ref 39–117)
ALT: 17 U/L (ref 0–35)
AST: 24 U/L (ref 0–37)
Albumin: 4 g/dL (ref 3.5–5.2)
BUN: 35 mg/dL — ABNORMAL HIGH (ref 6–23)
CALCIUM: 9.2 mg/dL (ref 8.4–10.5)
CHLORIDE: 107 meq/L (ref 96–112)
CO2: 28 mEq/L (ref 19–32)
Creatinine, Ser: 0.7 mg/dL (ref 0.4–1.2)
GFR: 86.21 mL/min (ref >60.00)
Glucose, Bld: 93 mg/dL (ref 70–99)
Potassium: 4.4 mEq/L (ref 3.5–5.1)
Sodium: 142 mEq/L (ref 135–145)
Total Bilirubin: 0.4 mg/dL (ref 0.3–1.2)
Total Protein: 6.6 g/dL (ref 6.0–8.3)

## 2013-08-08 LAB — POCT URINALYSIS DIPSTICK
BILIRUBIN UA: NEGATIVE
Blood, UA: NEGATIVE
Glucose, UA: NEGATIVE
KETONES UA: NEGATIVE
LEUKOCYTES UA: NEGATIVE
Nitrite, UA: NEGATIVE
Spec Grav, UA: >=1.03
Urobilinogen, UA: 0.2
pH, UA: 5.5

## 2013-08-08 LAB — CBC WITH DIFFERENTIAL/PLATELET
BASOS PCT: 0.1 % (ref 0.0–3.0)
Basophils Absolute: 0 10*3/uL (ref 0.0–0.1)
EOS PCT: 2.8 % (ref 0.0–5.0)
Eosinophils Absolute: 0.2 10*3/uL (ref 0.0–0.7)
HEMATOCRIT: 36.7 % (ref 36.0–46.0)
Hemoglobin: 12.2 g/dL (ref 12.0–15.0)
Lymphocytes Relative: 17.1 % (ref 12.0–46.0)
Lymphs Abs: 1.2 10*3/uL (ref 0.7–4.0)
MCHC: 33.3 g/dL (ref 30.0–36.0)
MCV: 100.5 fl — AB (ref 78.0–100.0)
MONO ABS: 0.6 10*3/uL (ref 0.1–1.0)
MONOS PCT: 9.5 % (ref 3.0–12.0)
NEUTROS ABS: 4.8 10*3/uL (ref 1.4–7.7)
Neutrophils Relative %: 70.5 % (ref 43.0–77.0)
PLATELETS: 205 10*3/uL (ref 150.0–400.0)
RBC: 3.65 Mil/uL — AB (ref 3.87–5.11)
RDW: 13.6 % (ref 11.5–14.6)
WBC: 6.8 10*3/uL (ref 4.5–10.5)

## 2013-08-08 LAB — HEMOGLOBIN A1C: Hgb A1c MFr Bld: 5.4 % (ref 4.6–6.5)

## 2013-08-08 LAB — TSH: TSH: 2.41 u[IU]/mL (ref 0.35–5.50)

## 2013-08-08 NOTE — Progress Notes (Signed)
Pre visit review using our clinic review tool, if applicable. No additional management support is needed unless otherwise documented below in the visit note. 

## 2013-08-08 NOTE — Progress Notes (Signed)
Subjective:    Patient ID: Rebecca [name], female    DOB: [birth year], 80 y.o.   MRN: [id number]  HPI  80 year old white female, nonsmoker is in today to be reestablished. She has a history of hypertension, diverticulosis, osteoarthritis, memory loss, low back pain, and neck pain. Requesting to be reestablished to the practice from previous care at the nursing home facility. Has concerns about her memory has been ongoing. Reports frequently forgetting people's names.  Review of Systems  Constitutional: Negative.   HENT: Negative.   Respiratory: Negative.   Cardiovascular: Negative.   Gastrointestinal: Negative.   Endocrine: Negative.   Genitourinary: Negative.   Musculoskeletal: Positive for arthralgias, back pain and neck pain.       Osteoarthritis   Skin: Negative.   Allergic/Immunologic: Negative.   Neurological: Negative.   Psychiatric/Behavioral: Negative for dysphoric mood and decreased concentration.       Memory loss   Past Medical History  Diagnosis Date  . Osteoporosis 02/2011  . Seizures   . DJD (degenerative joint disease)   . Thyroid disease   . Diverticulosis 02/2011  . Hemorrhoids 02/2011  . Dizziness   . Low back pain   . Mitral valve problem thickened    thickened  . Unspecified hypothyroidism 01/2011  . 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 Vitamin D deficiency 01/2011  . Anemia, unspecified 03/18/2011  . Anxiety state, unspecified 01/2011  . 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 Pain in joint, site unspecified 01/2012  . Sacroiliitis, not elsewhere classified 05/2011  . Dysuria 06/17/2011  . Other abnormal blood chemistry 0/30/2012  . Blood in stool 06/15/2012  . Other acquired deformity of toe 12/16/2011  . Corns and callosities 07/01/2011    History   Social History  . Marital Status: Widowed    Spouse Name: N/A    Number of Children: N/A  . Years of Education: N/A   Occupational History    . Not on file.   Social History Main Topics  . Smoking status: Former Smoker    Quit date: 09/14/1988  . Smokeless tobacco: Never Used  . Alcohol Use: 0.6 oz/week    1 Glasses of wine per week     Comment: twice a month  . Drug Use: No  . Sexual Activity: No   Other Topics Concern  . Not on file   Social History Narrative  . No narrative on file    Past Surgical History  Procedure Laterality Date  . Cervical laminectomy  2005  . Bletheroplasty    . Hammer toe surgery  2013    right 2nd toe Dr. Mallie Mussel  . Joint replacement Bilateral 2002 Right, 1992 Left    knees  . Cataract extraction w/ intraocular lens  implant, bilateral  2010    Family History  Problem Relation Age of Onset  . Alzheimer's disease Sister   . Emphysema Sister     Allergies  Allergen Reactions  . Morphine And Related   . Sulfa Antibiotics     Current Outpatient Prescriptions on File Prior to Visit  Medication Sig Dispense Refill  . acetaminophen (TYLENOL) 500 MG tablet Take 500 mg by mouth. Take one as needed      . amoxicillin (AMOXIL) 500 MG capsule Take 500 mg by mouth. Take 4 capsules one hour prior to dental appointment      . Calcium Carb-Cholecalciferol (CALCIUM 500 +D PO) Take by mouth. Take one twice  daily      . cetirizine (KLS ALLER-TEC) 10 MG tablet Take 10 mg by mouth daily. As needed for allergies      . Cholecalciferol (VITAMIN D-3) 5000 UNITS TABS Take by mouth. Take one twice daily      . HYDROcodone-homatropine (HYCODAN) 5-1.5 MG/5ML syrup Take 5 mLs by mouth every 8 (eight) hours as needed for cough.  120 mL  0  . levothyroxine (SYNTHROID, LEVOTHROID) 100 MCG tablet Take 1 tablet (100 mcg total) by mouth daily before breakfast. For thyroid  90 tablet  3  . meloxicam (MOBIC) 15 MG tablet Take 15 mg by mouth. Take one tablet daily to help arthritis      . Multiple Vitamin (MULTIVITAMINS PO) Take by mouth. Take one tablet daily      . phenytoin (DILANTIN) 100 MG ER capsule 1 in  the am and 2 at bedtime to prevent seizures  360 capsule  3  . Pseudoephedrine-APAP-DM (TYLENOL COLD/FLU SEVERE DAY PO) Take by mouth. Take one as needed      . traMADol (ULTRAM) 50 MG tablet Take 50 mg by mouth. Take one every 4-6 hours as needed for pain       No current facility-administered medications on file prior to visit.    BP 96/64  Pulse 88  Temp(Src) 97.7 F (36.5 C) (Oral)  Ht 5\' 2"  (1.575 m)  Wt 127 lb (57.607 kg)  BMI 23.22 kg/m2  SpO2 96%chart    Objective:   Physical Exam  Constitutional: She is oriented to person, place, and time. She appears well-developed and well-nourished.  HENT:  Right Ear: External ear normal.  Left Ear: External ear normal.  Nose: Nose normal.  Mouth/Throat: Oropharynx is clear and moist.  Neck: Normal range of motion. Neck supple.  Cardiovascular: Normal rate, regular rhythm and normal heart sounds.   Pulmonary/Chest: Effort normal and breath sounds normal.  Abdominal: Soft. Bowel sounds are normal.  Musculoskeletal: Normal range of motion.  Neurological: She is alert and oriented to person, place, and time.  Skin: Skin is warm and dry.  Psychiatric: She has a normal mood and affect.          Assessment & Plan:  Rebecca [name] was seen today for no specified reason.  Diagnoses and associated orders for this visit:  Osteoarthritis - Phenytoin Level, Total  Anemia - Phenytoin Level, Total - CBC with Differential  Chronic neck pain - Phenytoin Level, Total  Hyperglycemia - CMP - Hemoglobin A1c - Phenytoin Level, Total - POC Urinalysis Dipstick  Unspecified hypothyroidism - TSH - Phenytoin Level, Total   Recheck pending labs and sooner as needed.

## 2013-08-08 NOTE — Patient Instructions (Signed)
Osteoarthritis Osteoarthritis is a disease that causes soreness and swelling (inflammation) of a joint. It occurs when the cartilage at the affected joint wears down. Cartilage acts as a cushion, covering the ends of bones where they meet to form a joint. Osteoarthritis is the most common form of arthritis. It often occurs in older people. The joints affected most often by this condition include those in the:  Ends of the fingers.  Thumbs.  Neck.  Lower back.  Knees.  Hips. CAUSES  Over time, the cartilage that covers the ends of bones begins to wear away. This causes bone to rub on bone, producing pain and stiffness in the affected joints.  RISK FACTORS Certain factors can increase your chances of having osteoarthritis, including:  Older age.  Excessive body weight.  Overuse of joints. SIGNS AND SYMPTOMS   Pain, swelling, and stiffness in the joint.  Over time, the joint may lose its normal shape.  Small deposits of bone (osteophytes) may grow on the edges of the joint.  Bits of bone or cartilage can break off and float inside the joint space. This may cause more pain and damage. DIAGNOSIS  Your health care provider will do a physical exam and ask about your symptoms. Various tests may be ordered, such as:  X-rays of the affected joint.  An MRI scan.  Blood tests to rule out other types of arthritis.  Joint fluid tests. This involves using a needle to draw fluid from the joint and examining the fluid under a microscope. TREATMENT  Goals of treatment are to control pain and improve joint function. Treatment plans may include:  A prescribed exercise program that allows for rest and joint relief.  A weight control plan.  Pain relief techniques, such as:  Properly applied heat and cold.  Electric pulses delivered to nerve endings under the skin (transcutaneous electrical nerve stimulation, TENS).  Massage.  Certain nutritional supplements.  Medicines to  control pain, such as:  Acetaminophen.  Nonsteroidal anti-inflammatory drugs (NSAIDs), such as naproxen.  Narcotic or central-acting agents, such as tramadol.  Corticosteroids. These can be given orally or as an injection.  Surgery to reposition the bones and relieve pain (osteotomy) or to remove loose pieces of bone and cartilage. Joint replacement may be needed in advanced states of osteoarthritis. HOME CARE INSTRUCTIONS   Only take over-the-counter or prescription medicines as directed by your health care provider. Take all medicines exactly as instructed.  Maintain a healthy weight. Follow your health care provider's instructions for weight control. This may include dietary instructions.  Exercise as directed. Your health care provider can recommend specific types of exercise. These may include:  Strengthening exercises These are done to strengthen the muscles that support joints affected by arthritis. They can be performed with weights or with exercise bands to add resistance.  Aerobic activities These are exercises, such as brisk walking or low-impact aerobics, that get your heart pumping.  Range-of-motion activities These keep your joints limber.  Balance and agility exercises These help you maintain daily living skills.  Rest your affected joints as directed by your health care provider.  Follow up with your health care provider as directed. SEEK MEDICAL CARE IF:   Your skin turns red.  You develop a rash in addition to your joint pain.  You have worsening joint pain. SEEK IMMEDIATE MEDICAL CARE IF:  You have a significant loss of weight or appetite.  You have a fever along with joint or muscle aches.  You have   night sweats. FOR MORE INFORMATION  National Institute of Arthritis and Musculoskeletal and Skin Diseases: www.niams.nih.gov National Institute on Aging: www.nia.nih.gov American College of Rheumatology: www.rheumatology.org Document Released: 05/05/2005  Document Revised: 02/23/2013 Document Reviewed: 01/10/2013 [employer] Patient Information 2014 [employer].  

## 2013-08-09 LAB — PHENYTOIN LEVEL, TOTAL: PHENYTOIN LVL: 9.4 ug/mL — AB (ref 10.0–20.0)

## 2013-08-17 ENCOUNTER — Encounter: Payer: Self-pay | Admitting: Family

## 2013-08-17 ENCOUNTER — Other Ambulatory Visit: Payer: Self-pay

## 2013-08-17 ENCOUNTER — Ambulatory Visit (INDEPENDENT_AMBULATORY_CARE_PROVIDER_SITE_OTHER): Payer: Medicare Other | Admitting: Family

## 2013-08-17 VITALS — BP 100/62 | HR 73 | Temp 97.8°F | Ht 62.0 in | Wt 127.0 lb

## 2013-08-17 DIAGNOSIS — M47812 Spondylosis without myelopathy or radiculopathy, cervical region: Secondary | ICD-10-CM

## 2013-08-17 DIAGNOSIS — M542 Cervicalgia: Secondary | ICD-10-CM | POA: Diagnosis not present

## 2013-08-17 MED ORDER — TRAMADOL HCL 50 MG PO TABS: ORAL_TABLET | ORAL | Status: DC

## 2013-08-17 MED ORDER — TRAMADOL HCL 50 MG PO TABS: 50.0000 mg | ORAL_TABLET | Freq: Two times a day (BID) | ORAL | Status: DC

## 2013-08-17 NOTE — Patient Instructions (Signed)
Osteoarthritis Osteoarthritis is a disease that causes soreness and swelling (inflammation) of a joint. It occurs when the cartilage at the affected joint wears down. Cartilage acts as a cushion, covering the ends of bones where they meet to form a joint. Osteoarthritis is the most common form of arthritis. It often occurs in older people. The joints affected most often by this condition include those in the:  Ends of the fingers.  Thumbs.  Neck.  Lower back.  Knees.  Hips. CAUSES  Over time, the cartilage that covers the ends of bones begins to wear away. This causes bone to rub on bone, producing pain and stiffness in the affected joints.  RISK FACTORS Certain factors can increase your chances of having osteoarthritis, including:  Older age.  Excessive body weight.  Overuse of joints. SIGNS AND SYMPTOMS   Pain, swelling, and stiffness in the joint.  Over time, the joint may lose its normal shape.  Small deposits of bone (osteophytes) may grow on the edges of the joint.  Bits of bone or cartilage can break off and float inside the joint space. This may cause more pain and damage. DIAGNOSIS  Your health care provider will do a physical exam and ask about your symptoms. Various tests may be ordered, such as:  X-rays of the affected joint.  An MRI scan.  Blood tests to rule out other types of arthritis.  Joint fluid tests. This involves using a needle to draw fluid from the joint and examining the fluid under a microscope. TREATMENT  Goals of treatment are to control pain and improve joint function. Treatment plans may include:  A prescribed exercise program that allows for rest and joint relief.  A weight control plan.  Pain relief techniques, such as:  Properly applied heat and cold.  Electric pulses delivered to nerve endings under the skin (transcutaneous electrical nerve stimulation, TENS).  Massage.  Certain nutritional supplements.  Medicines to  control pain, such as:  Acetaminophen.  Nonsteroidal anti-inflammatory drugs (NSAIDs), such as naproxen.  Narcotic or central-acting agents, such as tramadol.  Corticosteroids. These can be given orally or as an injection.  Surgery to reposition the bones and relieve pain (osteotomy) or to remove loose pieces of bone and cartilage. Joint replacement may be needed in advanced states of osteoarthritis. HOME CARE INSTRUCTIONS   Only take over-the-counter or prescription medicines as directed by your health care provider. Take all medicines exactly as instructed.  Maintain a healthy weight. Follow your health care provider's instructions for weight control. This may include dietary instructions.  Exercise as directed. Your health care provider can recommend specific types of exercise. These may include:  Strengthening exercises These are done to strengthen the muscles that support joints affected by arthritis. They can be performed with weights or with exercise bands to add resistance.  Aerobic activities These are exercises, such as brisk walking or low-impact aerobics, that get your heart pumping.  Range-of-motion activities These keep your joints limber.  Balance and agility exercises These help you maintain daily living skills.  Rest your affected joints as directed by your health care provider.  Follow up with your health care provider as directed. SEEK MEDICAL CARE IF:   Your skin turns red.  You develop a rash in addition to your joint pain.  You have worsening joint pain. SEEK IMMEDIATE MEDICAL CARE IF:  You have a significant loss of weight or appetite.  You have a fever along with joint or muscle aches.  You have   night sweats. FOR MORE INFORMATION  National Institute of Arthritis and Musculoskeletal and Skin Diseases: www.niams.nih.gov National Institute on Aging: www.nia.nih.gov American College of Rheumatology: www.rheumatology.org Document Released: 05/05/2005  Document Revised: 02/23/2013 Document Reviewed: 01/10/2013 [employer] Patient Information 2014 [employer].  

## 2013-08-17 NOTE — Progress Notes (Signed)
Subjective:    Patient ID: Rebecca [name], female    DOB: 02/16/1928, 80 y.o.   MRN: [id number]  HPI 80 year old, white female, with a history of Alzheimer's Disease is is in with complaints of neck pain x several months. Reports having a fall in August 2014 and had a CT scan in December that showed severe arthritis. Rates pain 10/10, sharp, worse with movement. Has been applying a heating pad and taking Aleve without much relief.    Review of Systems  Constitutional: Negative.   HENT: Negative.   Respiratory: Negative.   Cardiovascular: Negative.   Gastrointestinal: Negative.   Musculoskeletal: Positive for arthralgias.       Arthritis neck, neck pain  Skin: Negative.   Neurological: Negative.   Psychiatric/Behavioral:       Dementia   Past Medical History  Diagnosis Date  . Osteoporosis 02/2011  . Seizures   . DJD (degenerative joint disease)   . Thyroid disease   . Diverticulosis 02/2011  . Hemorrhoids 02/2011  . Dizziness   . Low back pain   . Mitral valve problem thickened    thickened  . Unspecified hypothyroidism 01/2011  . 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 Vitamin D deficiency 01/2011  . Anemia, unspecified 03/18/2011  . Anxiety state, unspecified 01/2011  . 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 Pain in joint, site unspecified 01/2012  . Sacroiliitis, not elsewhere classified 05/2011  . Dysuria 06/17/2011  . Other abnormal blood chemistry 0/30/2012  . Blood in stool 06/15/2012  . Other acquired deformity of toe 12/16/2011  . Corns and callosities 07/01/2011    History   Social History  . Marital Status: Widowed    Spouse Name: N/A    Number of Children: N/A  . Years of Education: N/A   Occupational History  . Not on file.   Social History Main Topics  . Smoking status: Former Smoker    Quit date: 09/14/1988  . Smokeless tobacco: Never Used  . Alcohol Use: 0.6 oz/week    1 Glasses of wine per  week     Comment: twice a month  . Drug Use: No  . Sexual Activity: No   Other Topics Concern  . Not on file   Social History Narrative  . No narrative on file    Past Surgical History  Procedure Laterality Date  . Cervical laminectomy  2005  . Bletheroplasty    . Hammer toe surgery  2013    right 2nd toe Dr. Mallie Mussel  . Joint replacement Bilateral 2002 Right, 1992 Left    knees  . Cataract extraction w/ intraocular lens  implant, bilateral  2010    Family History  Problem Relation Age of Onset  . Alzheimer's disease Sister   . Emphysema Sister     Allergies  Allergen Reactions  . Morphine And Related   . Sulfa Antibiotics     Current Outpatient Prescriptions on File Prior to Visit  Medication Sig Dispense Refill  . acetaminophen (TYLENOL) 500 MG tablet Take 500 mg by mouth. Take one as needed      . amoxicillin (AMOXIL) 500 MG capsule Take 500 mg by mouth. Take 4 capsules one hour prior to dental appointment      . Calcium Carb-Cholecalciferol (CALCIUM 500 +D PO) Take by mouth. Take one twice daily      . cetirizine (KLS ALLER-TEC) 10 MG tablet Take 10 mg by mouth  daily. As needed for allergies      . Cholecalciferol (VITAMIN D-3) 5000 UNITS TABS Take by mouth. Take one twice daily      . HYDROcodone-homatropine (HYCODAN) 5-1.5 MG/5ML syrup Take 5 mLs by mouth every 8 (eight) hours as needed for cough.  120 mL  0  . levothyroxine (SYNTHROID, LEVOTHROID) 100 MCG tablet Take 1 tablet (100 mcg total) by mouth daily before breakfast. For thyroid  90 tablet  3  . meloxicam (MOBIC) 15 MG tablet Take 15 mg by mouth. Take one tablet daily to help arthritis      . Multiple Vitamin (MULTIVITAMINS PO) Take by mouth. Take one tablet daily      . phenytoin (DILANTIN) 100 MG ER capsule 1 in the am and 2 at bedtime to prevent seizures  360 capsule  3  . Pseudoephedrine-APAP-DM (TYLENOL COLD/FLU SEVERE DAY PO) Take by mouth. Take one as needed       No current facility-administered  medications on file prior to visit.    BP 100/62  Pulse 73  Temp(Src) 97.8 F (36.6 C) (Oral)  Ht 5\' 2"  (1.575 m)  Wt 127 lb (57.607 kg)  BMI 23.22 kg/m2  SpO2 94%chart    Objective:   Physical Exam  Constitutional: She is oriented to person, place, and time. She appears well-developed and well-nourished.  Neck: Neck supple.  Pain with rotation or the neck. No tenderness to palpation.   Cardiovascular: Normal rate, regular rhythm and normal heart sounds.   Pulmonary/Chest: Effort normal and breath sounds normal.  Musculoskeletal: Normal range of motion.  Neurological: She is alert and oriented to person, place, and time.  Skin: Skin is warm and dry.  Psychiatric: She has a normal mood and affect.          Assessment & Plan:  Angeleen was seen today for neck pain.  Diagnoses and associated orders for this visit:  Cervical spondylarthritis  Neck pain  Other Orders - traMADol (ULTRAM) 50 MG tablet; Take 1 tablet (50 mg total) by mouth 2 (two) times daily. Take one every 4-6 hours as needed for pain   Call the office with any questions or concerns. Recheck in 4 months and sooner as needed.

## 2013-08-23 DIAGNOSIS — M1990 Unspecified osteoarthritis, unspecified site: Secondary | ICD-10-CM | POA: Diagnosis not present

## 2013-08-23 DIAGNOSIS — M542 Cervicalgia: Secondary | ICD-10-CM | POA: Diagnosis not present

## 2013-09-13 ENCOUNTER — Encounter: Payer: Self-pay | Admitting: Internal Medicine

## 2013-09-21 DIAGNOSIS — G5600 Carpal tunnel syndrome, unspecified upper limb: Secondary | ICD-10-CM | POA: Diagnosis not present

## 2013-09-21 DIAGNOSIS — M542 Cervicalgia: Secondary | ICD-10-CM | POA: Diagnosis not present

## 2013-09-21 DIAGNOSIS — M5090 Cervical disc disorder, unspecified, unspecified cervical region: Secondary | ICD-10-CM | POA: Diagnosis not present

## 2013-09-30 DIAGNOSIS — G5600 Carpal tunnel syndrome, unspecified upper limb: Secondary | ICD-10-CM | POA: Insufficient documentation

## 2013-10-18 DIAGNOSIS — M542 Cervicalgia: Secondary | ICD-10-CM | POA: Diagnosis not present

## 2013-10-28 DIAGNOSIS — G5600 Carpal tunnel syndrome, unspecified upper limb: Secondary | ICD-10-CM | POA: Diagnosis not present

## 2013-10-31 DIAGNOSIS — M542 Cervicalgia: Secondary | ICD-10-CM | POA: Diagnosis not present

## 2013-10-31 DIAGNOSIS — H7290 Unspecified perforation of tympanic membrane, unspecified ear: Secondary | ICD-10-CM | POA: Insufficient documentation

## 2013-10-31 DIAGNOSIS — R293 Abnormal posture: Secondary | ICD-10-CM | POA: Diagnosis not present

## 2013-10-31 DIAGNOSIS — M25529 Pain in unspecified elbow: Secondary | ICD-10-CM | POA: Diagnosis not present

## 2013-11-03 DIAGNOSIS — R293 Abnormal posture: Secondary | ICD-10-CM | POA: Diagnosis not present

## 2013-11-03 DIAGNOSIS — M542 Cervicalgia: Secondary | ICD-10-CM | POA: Diagnosis not present

## 2013-11-03 DIAGNOSIS — M25529 Pain in unspecified elbow: Secondary | ICD-10-CM | POA: Diagnosis not present

## 2013-11-07 DIAGNOSIS — M5090 Cervical disc disorder, unspecified, unspecified cervical region: Secondary | ICD-10-CM | POA: Diagnosis not present

## 2013-11-09 DIAGNOSIS — R293 Abnormal posture: Secondary | ICD-10-CM | POA: Diagnosis not present

## 2013-11-09 DIAGNOSIS — M25529 Pain in unspecified elbow: Secondary | ICD-10-CM | POA: Diagnosis not present

## 2013-11-09 DIAGNOSIS — M542 Cervicalgia: Secondary | ICD-10-CM | POA: Diagnosis not present

## 2013-11-11 DIAGNOSIS — M542 Cervicalgia: Secondary | ICD-10-CM | POA: Diagnosis not present

## 2013-11-11 DIAGNOSIS — R293 Abnormal posture: Secondary | ICD-10-CM | POA: Diagnosis not present

## 2013-11-11 DIAGNOSIS — M25529 Pain in unspecified elbow: Secondary | ICD-10-CM | POA: Diagnosis not present

## 2013-11-14 DIAGNOSIS — R293 Abnormal posture: Secondary | ICD-10-CM | POA: Diagnosis not present

## 2013-11-14 DIAGNOSIS — M25529 Pain in unspecified elbow: Secondary | ICD-10-CM | POA: Diagnosis not present

## 2013-11-14 DIAGNOSIS — M542 Cervicalgia: Secondary | ICD-10-CM | POA: Diagnosis not present

## 2013-11-16 DIAGNOSIS — R293 Abnormal posture: Secondary | ICD-10-CM | POA: Diagnosis not present

## 2013-11-16 DIAGNOSIS — H66019 Acute suppurative otitis media with spontaneous rupture of ear drum, unspecified ear: Secondary | ICD-10-CM | POA: Diagnosis not present

## 2013-11-16 DIAGNOSIS — M542 Cervicalgia: Secondary | ICD-10-CM | POA: Diagnosis not present

## 2013-11-16 DIAGNOSIS — H902 Conductive hearing loss, unspecified: Secondary | ICD-10-CM | POA: Diagnosis not present

## 2013-11-16 DIAGNOSIS — H903 Sensorineural hearing loss, bilateral: Secondary | ICD-10-CM | POA: Diagnosis not present

## 2013-11-16 DIAGNOSIS — H6120 Impacted cerumen, unspecified ear: Secondary | ICD-10-CM | POA: Diagnosis not present

## 2013-11-16 DIAGNOSIS — R41841 Cognitive communication deficit: Secondary | ICD-10-CM | POA: Diagnosis not present

## 2013-11-18 DIAGNOSIS — R293 Abnormal posture: Secondary | ICD-10-CM | POA: Diagnosis not present

## 2013-11-18 DIAGNOSIS — R41841 Cognitive communication deficit: Secondary | ICD-10-CM | POA: Diagnosis not present

## 2013-11-18 DIAGNOSIS — M542 Cervicalgia: Secondary | ICD-10-CM | POA: Diagnosis not present

## 2013-11-21 DIAGNOSIS — E039 Hypothyroidism, unspecified: Secondary | ICD-10-CM | POA: Diagnosis not present

## 2013-11-21 DIAGNOSIS — M542 Cervicalgia: Secondary | ICD-10-CM | POA: Diagnosis not present

## 2013-11-21 DIAGNOSIS — R293 Abnormal posture: Secondary | ICD-10-CM | POA: Diagnosis not present

## 2013-11-21 DIAGNOSIS — R7309 Other abnormal glucose: Secondary | ICD-10-CM | POA: Diagnosis not present

## 2013-11-21 DIAGNOSIS — R41841 Cognitive communication deficit: Secondary | ICD-10-CM | POA: Diagnosis not present

## 2013-11-21 LAB — BASIC METABOLIC PANEL
BUN: 23 mg/dL — AB (ref 4–21)
CREATININE: 0.6 mg/dL (ref 0.5–1.1)
Glucose: 117 mg/dL
POTASSIUM: 3.8 mmol/L (ref 3.4–5.3)
SODIUM: 143 mmol/L (ref 137–147)

## 2013-11-21 LAB — HEMOGLOBIN A1C: Hgb A1c MFr Bld: 5.5 % (ref 4.0–6.0)

## 2013-11-21 LAB — TSH: TSH: 3.22 u[IU]/mL (ref 0.41–5.90)

## 2013-11-23 DIAGNOSIS — M5090 Cervical disc disorder, unspecified, unspecified cervical region: Secondary | ICD-10-CM | POA: Diagnosis not present

## 2013-11-23 DIAGNOSIS — G5600 Carpal tunnel syndrome, unspecified upper limb: Secondary | ICD-10-CM | POA: Diagnosis not present

## 2013-11-23 DIAGNOSIS — M542 Cervicalgia: Secondary | ICD-10-CM | POA: Diagnosis not present

## 2013-11-25 DIAGNOSIS — R293 Abnormal posture: Secondary | ICD-10-CM | POA: Diagnosis not present

## 2013-11-25 DIAGNOSIS — M542 Cervicalgia: Secondary | ICD-10-CM | POA: Diagnosis not present

## 2013-11-25 DIAGNOSIS — R41841 Cognitive communication deficit: Secondary | ICD-10-CM | POA: Diagnosis not present

## 2013-11-29 ENCOUNTER — Non-Acute Institutional Stay: Payer: Medicare Other | Admitting: Internal Medicine

## 2013-11-29 ENCOUNTER — Other Ambulatory Visit: Payer: Self-pay | Admitting: Internal Medicine

## 2013-11-29 ENCOUNTER — Encounter: Payer: Self-pay | Admitting: Internal Medicine

## 2013-11-29 VITALS — BP 100/62 | HR 60 | Wt 127.0 lb

## 2013-11-29 DIAGNOSIS — R569 Unspecified convulsions: Secondary | ICD-10-CM

## 2013-11-29 DIAGNOSIS — H7291 Unspecified perforation of tympanic membrane, right ear: Secondary | ICD-10-CM

## 2013-11-29 DIAGNOSIS — E039 Hypothyroidism, unspecified: Secondary | ICD-10-CM

## 2013-11-29 DIAGNOSIS — G5603 Carpal tunnel syndrome, bilateral upper limbs: Secondary | ICD-10-CM

## 2013-11-29 DIAGNOSIS — R7309 Other abnormal glucose: Secondary | ICD-10-CM | POA: Diagnosis not present

## 2013-11-29 DIAGNOSIS — G5600 Carpal tunnel syndrome, unspecified upper limb: Secondary | ICD-10-CM

## 2013-11-29 DIAGNOSIS — M542 Cervicalgia: Secondary | ICD-10-CM | POA: Diagnosis not present

## 2013-11-29 DIAGNOSIS — D649 Anemia, unspecified: Secondary | ICD-10-CM

## 2013-11-29 DIAGNOSIS — R739 Hyperglycemia, unspecified: Secondary | ICD-10-CM

## 2013-11-29 DIAGNOSIS — H7290 Unspecified perforation of tympanic membrane, unspecified ear: Secondary | ICD-10-CM

## 2013-11-29 DIAGNOSIS — R413 Other amnesia: Secondary | ICD-10-CM

## 2013-11-29 NOTE — Progress Notes (Signed)
Patient ID: Rebecca [name], female   DOB: 06/01/1927, 80 y.o.   MRN: [id number]    Location:  [facility]   Place of Service: Clinic (12)    Allergies  Allergen Reactions  . Morphine And Related   . Sulfa Antibiotics     Chief Complaint  Patient presents with  . Medical Management of Chronic Issues    thyroid, hyperglycemia, anemia, memory. Here with son Rebecca [name]    HPI:  HYPOTHYROIDISM: compensated  Cervicalgia; chronic problem. She has a remote history of fracture at C7 and T1. There is DDD of the CS. Has seen Dr. Gladstone Lighter, Dr. Nelva Bush, and Dr. Trenton Gammon.Dr. Rolena Infante does not think surgery is an option. Dr. Nelva Bush is doing injections.  SEIZURE DISORDER; remains on Dilantin. No seizures inn last year.  Hyperglycemia: controlled  Memory loss: worsening  Anemia, unspecified: needs recheck  Ruptured tympanic membrane, right: seen by Dr Rebecca [name]. Started about 2 weeks ago. Has been using Ciprodex otic solution.  Carpal tunnel syndrome: bilateral. Diagnosed at [facility]. She is supposed to be wearing splints at night bilaterally. Her son says she is irregular about doing this. Saw Jeri Cos, PA at [facility].    Medications: Patient's Medications  New Prescriptions   No medications on file  Previous Medications   ACETAMINOPHEN (TYLENOL) 500 MG TABLET    Take 650 mg by mouth. Take one as needed   AMOXICILLIN (AMOXIL) 500 MG CAPSULE    Take 500 mg by mouth. Take 4 capsules one hour prior to dental appointment   CALCIUM CARB-CHOLECALCIFEROL (CALCIUM 500 +D PO)    Take by mouth. Take one twice daily   CETIRIZINE (KLS ALLER-TEC) 10 MG TABLET    Take 10 mg by mouth daily. As needed for allergies   CHOLECALCIFEROL (VITAMIN D-3) 5000 UNITS TABS    Take by mouth. Take one twice daily   HYDROCODONE-ACETAMINOPHEN (NORCO) 10-325 MG PER TABLET    Take one three times daily as needed for pain.   HYDROCODONE-HOMATROPINE (HYCODAN) 5-1.5 MG/5ML SYRUP    Take 5 mLs by mouth every 8 (eight) hours as needed for  cough.   LEVOTHYROXINE (SYNTHROID, LEVOTHROID) 100 MCG TABLET    Take 1 tablet (100 mcg total) by mouth daily before breakfast. For thyroid   MOMETASONE (ELOCON) 0.1 % CREAM    Apply to outer ear once a day as needed for itching   MULTIPLE VITAMIN (MULTIVITAMINS PO)    Take by mouth. Take one tablet daily   PHENYTOIN (DILANTIN) 100 MG ER CAPSULE    1 in the am and 2 at bedtime to prevent seizures   TRAMADOL (ULTRAM) 50 MG TABLET    Take one every 4-6 hours as needed for pain  Modified Medications   No medications on file  Discontinued Medications   MELOXICAM (MOBIC) 15 MG TABLET    Take 15 mg by mouth. Take one tablet daily to help arthritis   PSEUDOEPHEDRINE-APAP-DM (TYLENOL COLD/FLU SEVERE DAY PO)    Take by mouth. Take one as needed     Review of Systems  Constitutional: Negative.   HENT:       Bilateral hearing loss. June 2015 ruptured right Tm with drainage, but little pain.  Eyes: Negative.   Respiratory:       History of dyspnea on exertion  Cardiovascular: Negative.   Gastrointestinal:       Blood noted in stool and toilet paper occasionally.  Endocrine:       History of hypothyroidism  Genitourinary:  Negative.   Musculoskeletal:       Osteoarthritis with complaints of pain in multiple areas. Neck and right shoulder pains. Sharp pain in the right shoulder and arm when she abducts and rotated at the shoulder.  Skin:       Senile ecchymoses  Allergic/Immunologic: Negative.   Neurological: Positive for tremors.       Hx seizures.   Hematological: Negative.   Psychiatric/Behavioral:       Chronic anxiety. Memory deficits.    Filed Vitals:   11/29/13 0927  BP: 100/62  Pulse: 60  Weight: 127 lb (57.607 kg)   Body mass index is 23.22 kg/(m^2).  Physical Exam  Constitutional: She is oriented to person, place, and time. She appears well-developed and well-nourished. No distress.  HENT:  Bilateral hearing loss. Rupture of the right TM.   Eyes:  Lens implants in  both eyes  Neck: Normal range of motion. Neck supple. No JVD present. No tracheal deviation present. No thyromegaly present.  Cardiovascular: Normal rate, regular rhythm, normal heart sounds and intact distal pulses.  Exam reveals no gallop and no friction rub.   No murmur heard. Pulmonary/Chest: Effort normal and breath sounds normal. No respiratory distress. She has no wheezes. She exhibits no tenderness.  Abdominal: Soft. Bowel sounds are normal. She exhibits no distension and no mass. There is no tenderness.  Genitourinary: Guaiac negative stool.  Hemorrhoids. Normal sphincter tone. Stools are normal brown color.  Musculoskeletal: Normal range of motion. She exhibits no edema and no tenderness.  Tender in the posterior neck. Mild restriction in movements. Hammertoes at the left second and third. Right second toe overlaps the great toe. SP surgical repair of the right 2nd toe.  Shoulders sore. Painful in the right shoulder when she lifts overhead and rotates at the shpoulder.[facility] July 2014. Unstable gait.  Weaker grip of the right hand.  Lymphadenopathy:    She has no cervical adenopathy.  Neurological: She is alert and oriented to person, place, and time. No cranial nerve deficit. Coordination normal.  No loss of grip strength. Loss of memory. Bilateral tremor.  Skin: Skin is warm and dry. No rash noted. No erythema. No pallor.  Psychiatric: She has a normal mood and affect. Her behavior is normal. Thought content normal.     Labs reviewed: Nursing Home on 11/29/2013  Component Date Value Ref Range Status  . Glucose 11/21/2013 117   Final  . BUN 11/21/2013 23* 4 - 21 mg/dL Final  . Creatinine 11/21/2013 0.6  0.5 - 1.1 mg/dL Final  . Potassium 11/21/2013 3.8  3.4 - 5.3 mmol/L Final  . Sodium 11/21/2013 143  137 - 147 mmol/L Final  . Hemoglobin A1C 11/21/2013 5.5  4.0 - 6.0 % Final  . TSH 11/21/2013 3.22  0.41 - 5.90 uIU/mL Final      Assessment/Plan  1.  HYPOTHYROIDISM compensated  2. Cervicalgia Continue with Dr. Nelva Bush and ortho -encouraged regular exercises of neck as instructed.  3. SEIZURE DISORDER stable  4. Hyperglycemia Last glucose 117. No medications.-avoid concentrated sweets and starches  5. Memory loss -MMSE next visit  6. Anemia, unspecified -CBC, future  7. Ruptured tympanic membrane, right contine with Dr. Benjamine [name]  8. Bilateral carpal tunnel syndrome -encouraged to wear splints

## 2013-11-30 DIAGNOSIS — H903 Sensorineural hearing loss, bilateral: Secondary | ICD-10-CM | POA: Diagnosis not present

## 2013-11-30 DIAGNOSIS — H66019 Acute suppurative otitis media with spontaneous rupture of ear drum, unspecified ear: Secondary | ICD-10-CM | POA: Diagnosis not present

## 2013-11-30 DIAGNOSIS — H902 Conductive hearing loss, unspecified: Secondary | ICD-10-CM | POA: Diagnosis not present

## 2013-12-02 DIAGNOSIS — M542 Cervicalgia: Secondary | ICD-10-CM | POA: Diagnosis not present

## 2013-12-02 DIAGNOSIS — R293 Abnormal posture: Secondary | ICD-10-CM | POA: Diagnosis not present

## 2013-12-02 DIAGNOSIS — R41841 Cognitive communication deficit: Secondary | ICD-10-CM | POA: Diagnosis not present

## 2013-12-06 DIAGNOSIS — M542 Cervicalgia: Secondary | ICD-10-CM | POA: Diagnosis not present

## 2013-12-08 ENCOUNTER — Encounter: Payer: Self-pay | Admitting: Internal Medicine

## 2013-12-08 DIAGNOSIS — M542 Cervicalgia: Secondary | ICD-10-CM | POA: Diagnosis not present

## 2013-12-08 DIAGNOSIS — R293 Abnormal posture: Secondary | ICD-10-CM | POA: Diagnosis not present

## 2013-12-08 DIAGNOSIS — R41841 Cognitive communication deficit: Secondary | ICD-10-CM | POA: Diagnosis not present

## 2013-12-09 DIAGNOSIS — R41841 Cognitive communication deficit: Secondary | ICD-10-CM | POA: Diagnosis not present

## 2013-12-09 DIAGNOSIS — G5600 Carpal tunnel syndrome, unspecified upper limb: Secondary | ICD-10-CM | POA: Diagnosis not present

## 2013-12-09 DIAGNOSIS — M542 Cervicalgia: Secondary | ICD-10-CM | POA: Diagnosis not present

## 2013-12-09 DIAGNOSIS — R293 Abnormal posture: Secondary | ICD-10-CM | POA: Diagnosis not present

## 2013-12-14 DIAGNOSIS — R293 Abnormal posture: Secondary | ICD-10-CM | POA: Diagnosis not present

## 2013-12-14 DIAGNOSIS — M542 Cervicalgia: Secondary | ICD-10-CM | POA: Diagnosis not present

## 2013-12-14 DIAGNOSIS — R41841 Cognitive communication deficit: Secondary | ICD-10-CM | POA: Diagnosis not present

## 2013-12-20 DIAGNOSIS — R293 Abnormal posture: Secondary | ICD-10-CM | POA: Diagnosis not present

## 2013-12-20 DIAGNOSIS — M542 Cervicalgia: Secondary | ICD-10-CM | POA: Diagnosis not present

## 2013-12-20 DIAGNOSIS — R41841 Cognitive communication deficit: Secondary | ICD-10-CM | POA: Diagnosis not present

## 2013-12-20 DIAGNOSIS — M25529 Pain in unspecified elbow: Secondary | ICD-10-CM | POA: Diagnosis not present

## 2013-12-21 DIAGNOSIS — R293 Abnormal posture: Secondary | ICD-10-CM | POA: Diagnosis not present

## 2013-12-21 DIAGNOSIS — M25529 Pain in unspecified elbow: Secondary | ICD-10-CM | POA: Diagnosis not present

## 2013-12-21 DIAGNOSIS — R41841 Cognitive communication deficit: Secondary | ICD-10-CM | POA: Diagnosis not present

## 2013-12-21 DIAGNOSIS — M542 Cervicalgia: Secondary | ICD-10-CM | POA: Diagnosis not present

## 2013-12-23 DIAGNOSIS — M542 Cervicalgia: Secondary | ICD-10-CM | POA: Diagnosis not present

## 2013-12-23 DIAGNOSIS — R41841 Cognitive communication deficit: Secondary | ICD-10-CM | POA: Diagnosis not present

## 2013-12-23 DIAGNOSIS — M25529 Pain in unspecified elbow: Secondary | ICD-10-CM | POA: Diagnosis not present

## 2013-12-23 DIAGNOSIS — R293 Abnormal posture: Secondary | ICD-10-CM | POA: Diagnosis not present

## 2013-12-26 DIAGNOSIS — R293 Abnormal posture: Secondary | ICD-10-CM | POA: Diagnosis not present

## 2013-12-26 DIAGNOSIS — R41841 Cognitive communication deficit: Secondary | ICD-10-CM | POA: Diagnosis not present

## 2013-12-26 DIAGNOSIS — M542 Cervicalgia: Secondary | ICD-10-CM | POA: Diagnosis not present

## 2013-12-26 DIAGNOSIS — M25529 Pain in unspecified elbow: Secondary | ICD-10-CM | POA: Diagnosis not present

## 2013-12-28 DIAGNOSIS — R41841 Cognitive communication deficit: Secondary | ICD-10-CM | POA: Diagnosis not present

## 2013-12-28 DIAGNOSIS — M25529 Pain in unspecified elbow: Secondary | ICD-10-CM | POA: Diagnosis not present

## 2013-12-28 DIAGNOSIS — R293 Abnormal posture: Secondary | ICD-10-CM | POA: Diagnosis not present

## 2013-12-28 DIAGNOSIS — M542 Cervicalgia: Secondary | ICD-10-CM | POA: Diagnosis not present

## 2013-12-30 DIAGNOSIS — R41841 Cognitive communication deficit: Secondary | ICD-10-CM | POA: Diagnosis not present

## 2013-12-30 DIAGNOSIS — R293 Abnormal posture: Secondary | ICD-10-CM | POA: Diagnosis not present

## 2013-12-30 DIAGNOSIS — M25529 Pain in unspecified elbow: Secondary | ICD-10-CM | POA: Diagnosis not present

## 2013-12-30 DIAGNOSIS — M542 Cervicalgia: Secondary | ICD-10-CM | POA: Diagnosis not present

## 2014-01-02 DIAGNOSIS — G5600 Carpal tunnel syndrome, unspecified upper limb: Secondary | ICD-10-CM | POA: Diagnosis not present

## 2014-01-05 DIAGNOSIS — M542 Cervicalgia: Secondary | ICD-10-CM | POA: Diagnosis not present

## 2014-01-05 DIAGNOSIS — R41841 Cognitive communication deficit: Secondary | ICD-10-CM | POA: Diagnosis not present

## 2014-01-05 DIAGNOSIS — M25529 Pain in unspecified elbow: Secondary | ICD-10-CM | POA: Diagnosis not present

## 2014-01-05 DIAGNOSIS — R293 Abnormal posture: Secondary | ICD-10-CM | POA: Diagnosis not present

## 2014-01-09 DIAGNOSIS — R41841 Cognitive communication deficit: Secondary | ICD-10-CM | POA: Diagnosis not present

## 2014-01-09 DIAGNOSIS — R293 Abnormal posture: Secondary | ICD-10-CM | POA: Diagnosis not present

## 2014-01-09 DIAGNOSIS — M542 Cervicalgia: Secondary | ICD-10-CM | POA: Diagnosis not present

## 2014-01-09 DIAGNOSIS — M25529 Pain in unspecified elbow: Secondary | ICD-10-CM | POA: Diagnosis not present

## 2014-01-12 DIAGNOSIS — G5600 Carpal tunnel syndrome, unspecified upper limb: Secondary | ICD-10-CM | POA: Diagnosis not present

## 2014-01-13 DIAGNOSIS — M25529 Pain in unspecified elbow: Secondary | ICD-10-CM | POA: Diagnosis not present

## 2014-01-13 DIAGNOSIS — R293 Abnormal posture: Secondary | ICD-10-CM | POA: Diagnosis not present

## 2014-01-13 DIAGNOSIS — R41841 Cognitive communication deficit: Secondary | ICD-10-CM | POA: Diagnosis not present

## 2014-01-13 DIAGNOSIS — M542 Cervicalgia: Secondary | ICD-10-CM | POA: Diagnosis not present

## 2014-01-31 ENCOUNTER — Non-Acute Institutional Stay: Payer: Medicare Other | Admitting: Internal Medicine

## 2014-01-31 ENCOUNTER — Encounter: Payer: Self-pay | Admitting: Internal Medicine

## 2014-01-31 VITALS — BP 110/60 | HR 76 | Ht 62.0 in | Wt 132.0 lb

## 2014-01-31 DIAGNOSIS — M542 Cervicalgia: Secondary | ICD-10-CM

## 2014-01-31 DIAGNOSIS — R413 Other amnesia: Secondary | ICD-10-CM
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
## 2014-03-04 DIAGNOSIS — Z23 Encounter for immunization: Secondary | ICD-10-CM | POA: Diagnosis not present

## 2014-03-10 DIAGNOSIS — H349 Unspecified retinal vascular occlusion: Secondary | ICD-10-CM | POA: Diagnosis not present

## 2014-03-10 DIAGNOSIS — H52203 Unspecified astigmatism, bilateral: Secondary | ICD-10-CM | POA: Diagnosis not present

## 2014-03-27 DIAGNOSIS — D649 Anemia, unspecified: Secondary | ICD-10-CM | POA: Diagnosis not present

## 2014-03-27 DIAGNOSIS — E039 Hypothyroidism, unspecified: Secondary | ICD-10-CM | POA: Diagnosis not present

## 2014-03-27 DIAGNOSIS — Z79899 Other long term (current) drug therapy: Secondary | ICD-10-CM | POA: Diagnosis not present

## 2014-03-27 DIAGNOSIS — R569 Unspecified convulsions: Secondary | ICD-10-CM | POA: Diagnosis not present

## 2014-03-27 DIAGNOSIS — R7309 Other abnormal glucose: Secondary | ICD-10-CM | POA: Diagnosis not present

## 2014-03-27 LAB — HEPATIC FUNCTION PANEL
ALT: 10 U/L (ref 7–35)
AST: 19 U/L (ref 13–35)
Alkaline Phosphatase: 77 U/L (ref 25–125)
Bilirubin, Total: 6.2 mg/dL

## 2014-03-27 LAB — CBC AND DIFFERENTIAL
HCT: 39 % (ref 36–46)
HEMOGLOBIN: 12.6 g/dL (ref 12.0–16.0)
Platelets: 197 10*3/uL (ref 150–399)
WBC: 6.8 10^3/mL

## 2014-03-27 LAB — BASIC METABOLIC PANEL
BUN: 25 mg/dL — AB (ref 4–21)
Creatinine: 0.6 mg/dL (ref 0.5–1.1)
GLUCOSE: 84 mg/dL
POTASSIUM: 4.4 mmol/L (ref 3.4–5.3)
SODIUM: 141 mmol/L (ref 137–147)

## 2014-03-27 LAB — TSH: TSH: 0.98 u[IU]/mL (ref 0.41–5.90)

## 2014-04-04 ENCOUNTER — Non-Acute Institutional Stay: Payer: Medicare Other | Admitting: Internal Medicine

## 2014-04-04 ENCOUNTER — Encounter: Payer: Self-pay | Admitting: Internal Medicine

## 2014-04-04 VITALS — BP 110/64 | HR 64 | Wt 129.0 lb

## 2014-04-04 DIAGNOSIS — R569 Unspecified convulsions: Secondary | ICD-10-CM | POA: Diagnosis not present

## 2014-04-04 DIAGNOSIS — M542 Cervicalgia: Secondary | ICD-10-CM | POA: Diagnosis not present

## 2014-04-04 DIAGNOSIS — R413 Other amnesia: Secondary | ICD-10-CM

## 2014-04-04 DIAGNOSIS — M545 Low back pain, unspecified: Secondary | ICD-10-CM

## 2014-04-04 DIAGNOSIS — F411 Generalized anxiety disorder: Secondary | ICD-10-CM | POA: Diagnosis not present

## 2014-04-04 NOTE — Progress Notes (Signed)
Passed clock drawing 

## 2014-04-04 NOTE — Progress Notes (Signed)
Patient ID: Rebecca [name], female   DOB: 1927-12-30, 80 y.o.   MRN: [id number]    [facility]     Place of Service: Clinic (12)    Allergies  Allergen Reactions  . Morphine And Related   . Sulfa Antibiotics     Chief Complaint  Patient presents with  . Medical Management of Chronic Issues    thyroid, hyperglycemia, anemia, memory.  Here with son [name]  . Medication Management    Patient cut back on her Dilantin herself, was to take one in morning and two at bedtime, how taking one in morning and one at bedtime  . PT Initial Evaluation    wants referral for neck, after surgery on neck had been going and stopped, but wants to resume     HPI:  Cervicalgia; chronic problem. She has a remote history of fracture at C7 and T1. There is DDD of the CS. Has seen Dr. Gladstone Lighter, Dr. Nelva Bush, and Dr. Trenton Gammon.Dr. Rolena Infante does not think surgery is an option. Dr. Nelva Bush is doing injections.  Right 2nd toe overlaps the great toe. Had surgery by Dr. Mallie Mussel abut 2.5 years ago.   Carpal tunnel syndrome: bilateral. Diagnosed at [facility]. She is supposed to be wearing splints at night bilaterally. Her son says she is irregular about doing this. Saw Jeri Cos, PA at [facility].   Medications: Patient's Medications  New Prescriptions   No medications on file  Previous Medications   ACETAMINOPHEN (TYLENOL) 500 MG TABLET    Take 650 mg by mouth. Take one as needed   AMOXICILLIN (AMOXIL) 500 MG CAPSULE    Take 500 mg by mouth. Take 4 capsules one hour prior to dental appointment   CALCIUM CARB-CHOLECALCIFEROL (CALCIUM 500 +D PO)    Take by mouth. Take one twice daily   CETIRIZINE (KLS ALLER-TEC) 10 MG TABLET    Take 10 mg by mouth daily. As needed for allergies   CHOLECALCIFEROL (VITAMIN D-3) 5000 UNITS TABS    Take by mouth. Take one twice daily   LEVOTHYROXINE (SYNTHROID, LEVOTHROID) 100 MCG TABLET    Take 1 tablet (100 mcg total) by mouth daily before breakfast. For thyroid   MELOXICAM (MOBIC) 15 MG  TABLET    Take 15 mg by mouth. Take one daily for arthritis as needed   MOMETASONE (ELOCON) 0.1 % CREAM    Apply to outer ear once a day as needed for itching   MULTIPLE VITAMIN (MULTIVITAMINS PO)    Take by mouth. Take one tablet daily   PHENYTOIN (DILANTIN) 100 MG ER CAPSULE    Take 1 capsule by mouth in  the morning and 2 capsules  by mouth at bedtime   TRAMADOL (ULTRAM) 50 MG TABLET    Take one every 4-6 hours as needed for pain   TRIPROLIDINE-PSEUDOEPHEDRINE (ALLER-TIME PO)    Take by mouth. Take one daily as needed for allergies  Modified Medications   No medications on file  Discontinued Medications   HYDROCODONE-ACETAMINOPHEN (NORCO) 10-325 MG PER TABLET    Take one three times daily as needed for pain.   HYDROCODONE-HOMATROPINE (HYCODAN) 5-1.5 MG/5ML SYRUP    Take 5 mLs by mouth every 8 (eight) hours as needed for cough.     Review of Systems  Constitutional: Negative.   HENT:       Bilateral hearing loss. June 2015 ruptured right Tm with drainage, but little pain.  Eyes: Negative.   Respiratory:       History  of dyspnea on exertion  Cardiovascular: Negative.   Gastrointestinal:       Blood noted in stool and toilet paper occasionally.  Endocrine:       History of hypothyroidism  Genitourinary: Negative.   Musculoskeletal:       Osteoarthritis with complaints of pain in multiple areas. Neck and right shoulder pains. Sharp pain in the right shoulder and arm when she abducts and rotated at the shoulder.  Skin:       Senile ecchymoses  Allergic/Immunologic: Negative.   Neurological: Positive for tremors.       Hx seizures.   Hematological: Negative.   Psychiatric/Behavioral:       Chronic anxiety. Memory deficits.    Filed Vitals:   04/04/14 0916  BP: 110/64  Pulse: 64  Weight: 129 lb (58.514 kg)   Body mass index is 23.59 kg/(m^2).  Physical Exam  Constitutional: She is oriented to person, place, and time. She appears well-developed and well-nourished. No  distress.  HENT:  Bilateral hearing loss. Rupture of the right TM.   Eyes:  Lens implants in both eyes  Neck: Normal range of motion. Neck supple. No JVD present. No tracheal deviation present. No thyromegaly present.  Cardiovascular: Normal rate, regular rhythm, normal heart sounds and intact distal pulses.  Exam reveals no gallop and no friction rub.   No murmur heard. Pulmonary/Chest: Effort normal and breath sounds normal. No respiratory distress. She has no wheezes. She exhibits no tenderness.  Abdominal: Soft. Bowel sounds are normal. She exhibits no distension and no mass. There is no tenderness.  Genitourinary: Guaiac negative stool.  Hemorrhoids. Normal sphincter tone. Stools are normal brown color.  Musculoskeletal: Normal range of motion. She exhibits no edema or tenderness.  Tender in the posterior neck. Mild restriction in movements. Hammertoes at the left second and third. Right second toe overlaps the great toe. SP surgical repair of the right 2nd toe.  Shoulders sore. Painful in the right shoulder when she lifts overhead and rotates at the shpoulder.[facility] July 2014. Unstable gait.  Weaker grip of the right hand.  Lymphadenopathy:    She has no cervical adenopathy.  Neurological: She is alert and oriented to person, place, and time. No cranial nerve deficit. Coordination normal.  No loss of grip strength. Loss of memory. 04/04/14 MMSE 25/30. Passed clock drawing. Bilateral tremor.  Skin: Skin is warm and dry. No rash noted. No erythema. No pallor.  Psychiatric: She has a normal mood and affect. Her behavior is normal. Thought content normal.     Labs reviewed: Nursing Home on 04/04/2014  Component Date Value Ref Range Status  . Hemoglobin 03/27/2014 12.6  12.0 - 16.0 g/dL Final  . HCT 03/27/2014 39  36 - 46 % Final  . Platelets 03/27/2014 197  150 - 399 K/L Final  . WBC 03/27/2014 6.8   Final  . Glucose 03/27/2014 84   Final  . BUN 03/27/2014 25* 4 - 21 mg/dL Final   . Creatinine 03/27/2014 0.6  0.5 - 1.1 mg/dL Final  . Potassium 03/27/2014 4.4  3.4 - 5.3 mmol/L Final  . Sodium 03/27/2014 141  137 - 147 mmol/L Final  . Alkaline Phosphatase 03/27/2014 77  25 - 125 U/L Final  . ALT 03/27/2014 10  7 - 35 U/L Final  . AST 03/27/2014 19  13 - 35 U/L Final  . Bilirubin, Total 03/27/2014 6.2   Final  . TSH 03/27/2014 0.98  0.41 - 5.90 uIU/mL Final  Assessment/Plan  1. Memory loss MMSE is gradually falling. I advised her that I do not think she should be driving.   2. Cervicalgia Refer to PT  3. Midline low back pain without sciatica unchanged  4. Convulsions, unspecified convulsion type Advised her to resume Dilantin 100 mg in AM and 200 mg in PM. Current level is sub therapeutic.  5. Anxiety state Resume clonazepam

## 2014-04-07 ENCOUNTER — Other Ambulatory Visit: Payer: Self-pay | Admitting: Internal Medicine

## 2014-04-08 MED ORDER — CLONAZEPAM 0.5 MG PO TABS: 0.5000 mg | ORAL_TABLET | Freq: Two times a day (BID) | ORAL | Status: DC | PRN

## 2014-04-09 DIAGNOSIS — M542 Cervicalgia: Secondary | ICD-10-CM | POA: Diagnosis not present

## 2014-04-12 DIAGNOSIS — M542 Cervicalgia: Secondary | ICD-10-CM | POA: Diagnosis not present

## 2014-04-18 DIAGNOSIS — M542 Cervicalgia: Secondary | ICD-10-CM | POA: Diagnosis not present

## 2014-04-20 DIAGNOSIS — M542 Cervicalgia: Secondary | ICD-10-CM | POA: Diagnosis not present

## 2014-04-27 DIAGNOSIS — M542 Cervicalgia: Secondary | ICD-10-CM | POA: Diagnosis not present

## 2014-05-01 DIAGNOSIS — M542 Cervicalgia: Secondary | ICD-10-CM | POA: Diagnosis not present

## 2014-05-02 ENCOUNTER — Encounter: Payer: Self-pay | Admitting: Internal Medicine

## 2014-05-02 ENCOUNTER — Non-Acute Institutional Stay: Payer: Medicare Other | Admitting: Internal Medicine

## 2014-05-02 VITALS — BP 110/60 | HR 60 | Temp 97.7°F | Wt 132.0 lb

## 2014-05-02 DIAGNOSIS — M542 Cervicalgia: Secondary | ICD-10-CM | POA: Diagnosis not present

## 2014-05-02 MED ORDER — BACLOFEN 10 MG PO TABS: ORAL_TABLET | ORAL | Status: DC

## 2014-05-02 NOTE — Progress Notes (Signed)
Patient ID: Rebecca [name], female   DOB: 07/06/1927, 80 y.o.   MRN: [id number]    [facility]     Place of Service: Clinic (12)    Allergies  Allergen Reactions  . Morphine And Related   . Sulfa Antibiotics     Chief Complaint  Patient presents with  . Headache    left back of head, 2-3 times a week, wakes her up, takes one Tylenol  . Medication Management    when asked how she is taking her Dilantin she said 2 in morning and 3 in evening.     HPI:  Pain at the left occiput evening hours. Wakes her up About 3 times per week. No fever, nausea, change in vision. History of arthritis in the neck.   Has occasional pain in the right arm since she had a fall in the bathtub. Wears a wrist brace for carpal tunnel on the right side. Never has pain radiating down the left arm.     Medications: Patient's Medications  New Prescriptions   No medications on file  Previous Medications   ACETAMINOPHEN (TYLENOL) 500 MG TABLET    Take 500 mg by mouth. Take one as needed. Take 650 mg for arthritis   AMOXICILLIN (AMOXIL) 500 MG CAPSULE    Take 500 mg by mouth. Take 4 capsules one hour prior to dental appointment   CALCIUM CARB-CHOLECALCIFEROL (CALCIUM 500 +D PO)    Take by mouth. 600mg  - 400 units Take one twice daily   CETIRIZINE (KLS ALLER-TEC) 10 MG TABLET    Take 10 mg by mouth daily. As needed for allergies   CHOLECALCIFEROL (VITAMIN D-3) 5000 UNITS TABS    Take by mouth. Take one twice daily   CLONAZEPAM (KLONOPIN) 0.5 MG TABLET    Take 1 tablet (0.5 mg total) by mouth 2 (two) times daily as needed for anxiety.   DOXYLAMINE SUCCINATE, SLEEP, (SLEEP AID PO)    Take by mouth. One at bedtime   LEVOTHYROXINE (SYNTHROID, LEVOTHROID) 100 MCG TABLET    Take 1 tablet (100 mcg total) by mouth daily before breakfast. For thyroid   MELOXICAM (MOBIC) 15 MG TABLET    TAKE 1 TABLET BY MOUTH ONCE DAILY TO HELP ARTHRITIS   MOMETASONE (ELOCON) 0.1 % CREAM    Apply to outer ear once a day as  needed for itching   MULTIPLE VITAMIN (MULTIVITAMINS PO)    Take by mouth. Take one tablet daily   OMEGA-3 ACID ETHYL ESTERS (LOVAZA) 1 G CAPSULE    Take by mouth. Fatty Acid one daily   PHENYTOIN (DILANTIN) 100 MG ER CAPSULE    Take 1 capsule by mouth in  the morning and 2 capsules  by mouth at bedtime   TRAMADOL (ULTRAM) 50 MG TABLET    Take one every 4-6 hours as needed for pain   TRIPROLIDINE-PSEUDOEPHEDRINE (ALLER-TIME PO)    Take by mouth. Take one daily as needed for allergies  Modified Medications   No medications on file  Discontinued Medications   No medications on file     Review of Systems  Constitutional: Negative.   HENT:       Bilateral hearing loss. June 2015 ruptured right Tm with drainage, but little pain.  Eyes: Negative.   Respiratory:       History of dyspnea on exertion  Cardiovascular: Negative.   Gastrointestinal:       Blood noted in stool and toilet paper occasionally.  Endocrine:  History of hypothyroidism  Genitourinary: Negative.   Musculoskeletal:       Osteoarthritis with complaints of pain in multiple areas. Neck and right shoulder pains. Sharp pain in the right shoulder and arm when she abducts and rotated at the shoulder.  Skin:       Senile ecchymoses  Allergic/Immunologic: Negative.   Neurological: Positive for tremors.       Hx seizures.   Hematological: Negative.   Psychiatric/Behavioral:       Chronic anxiety. Memory deficits.    Filed Vitals:   05/02/14 0840  BP: 110/60  Pulse: 60  Temp: 97.7 F (36.5 C)  TempSrc: Oral  Weight: 132 lb (59.875 kg)   Body mass index is 24.14 kg/(m^2).  Physical Exam  Constitutional: She is oriented to person, place, and time. She appears well-developed and well-nourished. No distress.  HENT:  Bilateral hearing loss. Rupture of the right TM.   Eyes:  Lens implants in both eyes  Neck: Normal range of motion. Neck supple. No JVD present. No tracheal deviation present. No thyromegaly  present.  Cardiovascular: Normal rate, regular rhythm, normal heart sounds and intact distal pulses.  Exam reveals no gallop and no friction rub.   No murmur heard. Pulmonary/Chest: Effort normal and breath sounds normal. No respiratory distress. She has no wheezes. She exhibits no tenderness.  Abdominal: Soft. Bowel sounds are normal. She exhibits no distension and no mass. There is no tenderness.  Genitourinary: Guaiac negative stool.  Hemorrhoids. Normal sphincter tone. Stools are normal brown color.  Musculoskeletal: Normal range of motion. She exhibits no edema or tenderness.  Tender in the posterior neck. Mild restriction in movements. Hammertoes at the left second and third. Right second toe overlaps the great toe. SP surgical repair of the right 2nd toe.  Shoulders sore. Painful in the right shoulder when she lifts overhead and rotates at the shpoulder.[facility] July 2014. Unstable gait.  Weaker grip of the right hand.  Lymphadenopathy:    She has no cervical adenopathy.  Neurological: She is alert and oriented to person, place, and time. No cranial nerve deficit. Coordination normal.  No loss of grip strength. Loss of memory. 04/04/14 MMSE 25/30. Passed clock drawing. Bilateral tremor.  Skin: Skin is warm and dry. No rash noted. No erythema. No pallor.  Psychiatric: She has a normal mood and affect. Her behavior is normal. Thought content normal.     Labs reviewed: Nursing Home on 04/04/2014  Component Date Value Ref Range Status  . Hemoglobin 03/27/2014 12.6  12.0 - 16.0 g/dL Final  . HCT 03/27/2014 39  36 - 46 % Final  . Platelets 03/27/2014 197  150 - 399 K/L Final  . WBC 03/27/2014 6.8   Final  . Glucose 03/27/2014 84   Final  . BUN 03/27/2014 25* 4 - 21 mg/dL Final  . Creatinine 03/27/2014 0.6  0.5 - 1.1 mg/dL Final  . Potassium 03/27/2014 4.4  3.4 - 5.3 mmol/L Final  . Sodium 03/27/2014 141  137 - 147 mmol/L Final  . Alkaline Phosphatase 03/27/2014 77  25 - 125 U/L  Final  . ALT 03/27/2014 10  7 - 35 U/L Final  . AST 03/27/2014 19  13 - 35 U/L Final  . Bilirubin, Total 03/27/2014 6.2   Final  . TSH 03/27/2014 0.98  0.41 - 5.90 uIU/mL Final     Assessment/Plan  1. Neck pain -Use meloxicam - baclofen (LIORESAL) 10 MG tablet; One at bed to help prevent neck pains  Dispense: 30 each; Refill: 5

## 2014-05-24 DIAGNOSIS — S92525A Nondisplaced fracture of medial phalanx of left lesser toe(s), initial encounter for closed fracture: Secondary | ICD-10-CM | POA: Diagnosis not present

## 2014-05-24 DIAGNOSIS — G5762 Lesion of plantar nerve, left lower limb: Secondary | ICD-10-CM | POA: Diagnosis not present

## 2014-06-07 DIAGNOSIS — S92525A Nondisplaced fracture of medial phalanx of left lesser toe(s), initial encounter for closed fracture: Secondary | ICD-10-CM | POA: Diagnosis not present

## 2014-06-21 DIAGNOSIS — S92515D Nondisplaced fracture of proximal phalanx of left lesser toe(s), subsequent encounter for fracture with routine healing: Secondary | ICD-10-CM | POA: Diagnosis not present

## 2014-06-28 DIAGNOSIS — H6123 Impacted cerumen, bilateral: Secondary | ICD-10-CM | POA: Diagnosis not present

## 2014-06-28 DIAGNOSIS — H903 Sensorineural hearing loss, bilateral: Secondary | ICD-10-CM | POA: Diagnosis not present

## 2014-06-29 DIAGNOSIS — R7989 Other specified abnormal findings of blood chemistry: Secondary | ICD-10-CM | POA: Diagnosis not present

## 2014-06-29 DIAGNOSIS — R569 Unspecified convulsions: Secondary | ICD-10-CM | POA: Diagnosis not present

## 2014-06-29 LAB — HEPATIC FUNCTION PANEL
ALT: 10 U/L (ref 7–35)
AST: 19 U/L (ref 13–35)
Alkaline Phosphatase: 80 U/L (ref 25–125)
BILIRUBIN, TOTAL: 0.4 mg/dL

## 2014-06-29 LAB — BASIC METABOLIC PANEL
BUN: 25 mg/dL — AB (ref 4–21)
CREATININE: 0.6 mg/dL (ref 0.5–1.1)
GLUCOSE: 88 mg/dL
POTASSIUM: 4.1 mmol/L (ref 3.4–5.3)
SODIUM: 141 mmol/L (ref 137–147)

## 2014-07-04 ENCOUNTER — Encounter: Payer: Self-pay | Admitting: Internal Medicine

## 2014-07-04 ENCOUNTER — Non-Acute Institutional Stay: Payer: Medicare Other | Admitting: Internal Medicine

## 2014-07-04 VITALS — BP 110/62 | HR 64 | Temp 97.4°F | Wt 138.0 lb

## 2014-07-04 DIAGNOSIS — R413 Other amnesia: Secondary | ICD-10-CM

## 2014-07-04 DIAGNOSIS — G252 Other specified forms of tremor: Secondary | ICD-10-CM

## 2014-07-04 DIAGNOSIS — G250 Essential tremor: Secondary | ICD-10-CM

## 2014-07-04 DIAGNOSIS — E039 Hypothyroidism, unspecified: Secondary | ICD-10-CM | POA: Diagnosis not present

## 2014-07-04 DIAGNOSIS — M542 Cervicalgia: Secondary | ICD-10-CM

## 2014-07-04 DIAGNOSIS — R569 Unspecified convulsions: Secondary | ICD-10-CM

## 2014-07-04 DIAGNOSIS — R739 Hyperglycemia, unspecified: Secondary | ICD-10-CM

## 2014-07-04 DIAGNOSIS — R251 Tremor, unspecified: Secondary | ICD-10-CM

## 2014-07-04 MED ORDER — AMOXICILLIN 500 MG PO CAPS: ORAL_CAPSULE | ORAL | Status: DC

## 2014-07-04 MED ORDER — PHENYTOIN SODIUM EXTENDED 100 MG PO CAPS: ORAL_CAPSULE | ORAL | Status: DC

## 2014-07-04 NOTE — Progress Notes (Signed)
Passed clock drawing 

## 2014-07-04 NOTE — Progress Notes (Signed)
Patient ID: Rebecca [name], female   DOB: 18-Jan-1928, 81 y.o.   MRN: [id number]    [facility]     Place of Service: Clinic (12)    Allergies  Allergen Reactions  . Morphine And Related   . Sulfa Antibiotics     Chief Complaint  Patient presents with  . Medical Management of Chronic Issues    memory, neck pain, anxiety, hyperglycemia    HPI:  Memory loss: unchanged  Hyperglycemia: most recent lab is normal  Neck pain; resolved  TREMOR, ESSENTIAL: unchnaged  Hypothyroidism, unspecified hypothyroidism type: needs recheck at next visit.    Medications: Patient's Medications  New Prescriptions   No medications on file  Previous Medications   ACETAMINOPHEN (TYLENOL) 500 MG TABLET    Take 500 mg by mouth. Take one as needed. Take 650 mg for arthritis   AMOXICILLIN (AMOXIL) 500 MG CAPSULE    Take 500 mg by mouth. Take 4 capsules one hour prior to dental appointment   CALCIUM CARB-CHOLECALCIFEROL (CALCIUM 500 +D PO)    Take by mouth. 600mg  - 400 units Take one twice daily   CETIRIZINE (KLS ALLER-TEC) 10 MG TABLET    Take 10 mg by mouth daily. As needed for allergies   CHOLECALCIFEROL (VITAMIN D-3) 5000 UNITS TABS    Take by mouth. Take one twice daily   CLONAZEPAM (KLONOPIN) 0.5 MG TABLET    Take 1 tablet (0.5 mg total) by mouth 2 (two) times daily as needed for anxiety.   DOXYLAMINE SUCCINATE, SLEEP, (SLEEP AID PO)    Take by mouth. One at bedtime   LEVOTHYROXINE (SYNTHROID, LEVOTHROID) 100 MCG TABLET    Take 1 tablet (100 mcg total) by mouth daily before breakfast. For thyroid   MELOXICAM (MOBIC) 15 MG TABLET    TAKE 1 TABLET BY MOUTH ONCE DAILY TO HELP ARTHRITIS   MOMETASONE (ELOCON) 0.1 % CREAM    Apply to outer ear once a day as needed for itching   MULTIPLE VITAMIN (MULTIVITAMINS PO)    Take by mouth. Take one tablet daily   OMEGA-3 ACID ETHYL ESTERS (LOVAZA) 1 G CAPSULE    Take by mouth. Fatty Acid one daily   PHENYTOIN (DILANTIN) 100 MG ER CAPSULE    Take  1 capsule by mouth in  the morning and 2 capsules  by mouth at bedtime  Modified Medications   No medications on file  Discontinued Medications   BACLOFEN (LIORESAL) 10 MG TABLET    One at bed to help prevent neck pains   TRAMADOL (ULTRAM) 50 MG TABLET    Take one every 4-6 hours as needed for pain   TRIPROLIDINE-PSEUDOEPHEDRINE (ALLER-TIME PO)    Take by mouth. Take one daily as needed for allergies     Review of Systems  Constitutional: Negative.   HENT:       Bilateral hearing loss. June 2015 ruptured right Tm with drainage, but little pain.  Eyes: Negative.   Respiratory:       History of dyspnea on exertion  Cardiovascular: Negative.   Gastrointestinal:       Blood noted in stool and toilet paper occasionally.  Endocrine:       History of hypothyroidism  Genitourinary: Negative.   Musculoskeletal:       Osteoarthritis with complaints of pain in multiple areas. Neck and right shoulder pains. Sharp pain in the right shoulder and arm when she abducts and rotated at the shoulder.  Skin:  Senile ecchymoses  Allergic/Immunologic: Negative.   Neurological: Positive for tremors.       Hx seizures.   Hematological: Negative.   Psychiatric/Behavioral:       Chronic anxiety. Memory deficits.    Filed Vitals:   07/04/14 0940  BP: 110/62  Pulse: 64  Temp: 97.4 F (36.3 C)  TempSrc: Oral  Weight: 138 lb (62.596 kg)   Body mass index is 25.23 kg/(m^2).  Physical Exam  Constitutional: She is oriented to person, place, and time. She appears well-developed and well-nourished. No distress.  HENT:  Bilateral hearing loss. Rupture of the right TM.   Eyes:  Lens implants in both eyes  Neck: Normal range of motion. Neck supple. No JVD present. No tracheal deviation present. No thyromegaly present.  Cardiovascular: Normal rate, regular rhythm, normal heart sounds and intact distal pulses.  Exam reveals no gallop and no friction rub.   No murmur heard. Pulmonary/Chest:  Effort normal and breath sounds normal. No respiratory distress. She has no wheezes. She exhibits no tenderness.  Abdominal: Soft. Bowel sounds are normal. She exhibits no distension and no mass. There is no tenderness.  Genitourinary: Guaiac negative stool.  Hemorrhoids. Normal sphincter tone. Stools are normal brown color.  Musculoskeletal: Normal range of motion. She exhibits no edema or tenderness.  Tender in the posterior neck. Mild restriction in movements. Hammertoes at the left second and third. Right second toe overlaps the great toe. SP surgical repair of the right 2nd toe.  Shoulders sore. Painful in the right shoulder when she lifts overhead and rotates at the shpoulder.[facility] July 2014. Unstable gait.  Weaker grip of the right hand.  Lymphadenopathy:    She has no cervical adenopathy.  Neurological: She is alert and oriented to person, place, and time. No cranial nerve deficit. Coordination normal.  No loss of grip strength. Loss of memory. 04/04/14 MMSE 25/30. Passed clock drawing. 07/04/14 MMSE 28/30. Failed clock drawing. Bilateral tremor.  Skin: Skin is warm and dry. No rash noted. No erythema. No pallor.  Psychiatric: She has a normal mood and affect. Her behavior is normal. Thought content normal.     Labs reviewed: Nursing Home on 07/04/2014  Component Date Value Ref Range Status  . Glucose 06/29/2014 88   Final  . BUN 06/29/2014 25* 4 - 21 mg/dL Final  . Creatinine 06/29/2014 0.6  0.5 - 1.1 mg/dL Final  . Potassium 06/29/2014 4.1  3.4 - 5.3 mmol/L Final  . Sodium 06/29/2014 141  137 - 147 mmol/L Final  . Alkaline Phosphatase 06/29/2014 80  25 - 125 U/L Final  . ALT 06/29/2014 10  7 - 35 U/L Final  . AST 06/29/2014 19  13 - 35 U/L Final  . Bilirubin, Total 06/29/2014 0.4   Final  Nursing Home on 04/04/2014  Component Date Value Ref Range Status  . Hemoglobin 03/27/2014 12.6  12.0 - 16.0 g/dL Final  . HCT 03/27/2014 39  36 - 46 % Final  . Platelets 03/27/2014 197   150 - 399 K/L Final  . WBC 03/27/2014 6.8   Final  . Glucose 03/27/2014 84   Final  . BUN 03/27/2014 25* 4 - 21 mg/dL Final  . Creatinine 03/27/2014 0.6  0.5 - 1.1 mg/dL Final  . Potassium 03/27/2014 4.4  3.4 - 5.3 mmol/L Final  . Sodium 03/27/2014 141  137 - 147 mmol/L Final  . Alkaline Phosphatase 03/27/2014 77  25 - 125 U/L Final  . ALT 03/27/2014 10  7 -  35 U/L Final  . AST 03/27/2014 19  13 - 35 U/L Final  . Bilirubin, Total 03/27/2014 6.2   Final  . TSH 03/27/2014 0.98  0.41 - 5.90 uIU/mL Final     Assessment/Plan  1. Memory loss Unchanged  2. Hyperglycemia Improved  3. Neck pain Resolved  4. TREMOR, ESSENTIAL Unchanged  5. Hypothyroidism, unspecified hypothyroidism type Lab next appointment -TSH, future  6. Convulsions, unspecified convulsion type  Phenytoin level  20.2:  I have left her current doses unchanged. She says she was taking more than 3 capsules per day. - phenytoin (DILANTIN) 100 MG ER capsule; Take 1 capsule by mouth in  the morning and 2 capsules  by mouth at bedtime to prevent seizure.  Dispense: 270 capsule; Refill: 3

## 2014-07-11 ENCOUNTER — Encounter: Payer: Self-pay | Admitting: Internal Medicine

## 2014-09-18 DIAGNOSIS — E039 Hypothyroidism, unspecified: Secondary | ICD-10-CM | POA: Diagnosis not present

## 2014-09-18 DIAGNOSIS — R739 Hyperglycemia, unspecified: Secondary | ICD-10-CM | POA: Diagnosis not present

## 2014-09-18 LAB — BASIC METABOLIC PANEL
BUN: 24 mg/dL — AB (ref 4–21)
CREATININE: 0.7 mg/dL (ref 0.5–1.1)
GLUCOSE: 99 mg/dL
POTASSIUM: 4.1 mmol/L (ref 3.4–5.3)
Sodium: 143 mmol/L (ref 137–147)

## 2014-09-18 LAB — TSH: TSH: 7.39 u[IU]/mL — AB (ref 0.41–5.90)

## 2014-09-19 ENCOUNTER — Encounter: Payer: Self-pay | Admitting: *Deleted

## 2014-09-26 ENCOUNTER — Non-Acute Institutional Stay: Payer: Medicare Other | Admitting: Internal Medicine

## 2014-09-26 ENCOUNTER — Encounter: Payer: Self-pay | Admitting: Internal Medicine

## 2014-09-26 VITALS — BP 100/62 | HR 60 | Temp 97.5°F | Wt 127.0 lb

## 2014-09-26 DIAGNOSIS — R569 Unspecified convulsions: Secondary | ICD-10-CM | POA: Diagnosis not present

## 2014-09-26 DIAGNOSIS — M19041 Primary osteoarthritis, right hand: Secondary | ICD-10-CM

## 2014-09-26 DIAGNOSIS — E039 Hypothyroidism, unspecified: Secondary | ICD-10-CM

## 2014-09-26 DIAGNOSIS — F329 Major depressive disorder, single episode, unspecified: Secondary | ICD-10-CM

## 2014-09-26 DIAGNOSIS — R413 Other amnesia: Secondary | ICD-10-CM | POA: Diagnosis not present

## 2014-09-26 DIAGNOSIS — F32A Depression, unspecified: Secondary | ICD-10-CM | POA: Insufficient documentation

## 2014-09-26 DIAGNOSIS — M19042 Primary osteoarthritis, left hand: Secondary | ICD-10-CM | POA: Diagnosis not present

## 2014-09-26 MED ORDER — LEVOTHYROXINE SODIUM 100 MCG PO TABS: 100.0000 ug | ORAL_TABLET | Freq: Every day | ORAL | Status: DC

## 2014-09-26 MED ORDER — MIRTAZAPINE 15 MG PO TBDP: ORAL_TABLET | ORAL | Status: DC

## 2014-09-26 MED ORDER — PHENYTOIN SODIUM EXTENDED 100 MG PO CAPS: ORAL_CAPSULE | ORAL | Status: DC

## 2014-09-26 MED ORDER — MELOXICAM 15 MG PO TABS: ORAL_TABLET | ORAL | Status: DC

## 2014-09-26 NOTE — Progress Notes (Signed)
Patient ID: Rebecca [name], female   DOB: April 05, 1928, 81 y.o.   MRN: [id number]    [facility]     Place of Service: Clinic (12)     Allergies  Allergen Reactions  . Morphine And Related   . Sulfa Antibiotics     Chief Complaint  Patient presents with  . Medical Management of Chronic Issues    memory, anxiety, thyroid, tremor    HPI:   Patient complains of feeling depressed because she cannot drive anymore. She puts out one excuse after another in regards to why she won't ask people for rides or use the transportation provided by [facility].  Hypothyroidism, unspecified hypothyroidism type: She has again stopped taking her levothyroxine. She only says that she ran out of it. TSH is elevated.  Memory loss: Gradually getting worse.  Convulsions, unspecified convulsion type: None since last seen.    Medications: Patient's Medications  New Prescriptions   No medications on file  Previous Medications   ACETAMINOPHEN (TYLENOL) 500 MG TABLET    Take 500 mg by mouth. Take one as needed. Take 650 mg for arthritis   AMOXICILLIN (AMOXIL) 500 MG CAPSULE    Take 4 capsules one hour prior to dental appointment   CALCIUM CARB-CHOLECALCIFEROL (CALCIUM 500 +D PO)    Take by mouth. '600mg'$  - 400 units Take one twice daily   CETIRIZINE (KLS ALLER-TEC) 10 MG TABLET    Take 10 mg by mouth daily. As needed for allergies   CHOLECALCIFEROL (VITAMIN D-3) 5000 UNITS TABS    Take by mouth. Take one twice daily   CLONAZEPAM (KLONOPIN) 0.5 MG TABLET    Take 1 tablet (0.5 mg total) by mouth 2 (two) times daily as needed for anxiety.   DOXYLAMINE SUCCINATE, SLEEP, (SLEEP AID PO)    Take by mouth. One at bedtime   HYDROCODONE-ACETAMINOPHEN (NORCO) 10-325 MG PER TABLET    Take 1 tablet by mouth. Take one tablet 3 times daily as needed for pain written 11/03/13 Dr. Nelva Bush   LEVOTHYROXINE (SYNTHROID, LEVOTHROID) 100 MCG TABLET    Take 1 tablet (100 mcg total) by mouth daily before  breakfast. For thyroid   MELOXICAM (MOBIC) 15 MG TABLET    TAKE 1 TABLET BY MOUTH ONCE DAILY TO HELP ARTHRITIS   MOMETASONE (ELOCON) 0.1 % CREAM    Apply to outer ear once a day as needed for itching   MULTIPLE VITAMIN (MULTIVITAMINS PO)    Take by mouth. Take one tablet daily   OMEGA-3 ACID ETHYL ESTERS (LOVAZA) 1 G CAPSULE    Take by mouth. Fatty Acid one daily   PHENYTOIN (DILANTIN) 100 MG ER CAPSULE    Take 1 capsule by mouth in  the morning and 2 capsules  by mouth at bedtime to prevent seizure.  Modified Medications   No medications on file  Discontinued Medications   No medications on file     Review of Systems  Constitutional: Negative.   HENT:       Bilateral hearing loss. June 2015 ruptured right Tm with drainage, but little pain.  Eyes: Negative.   Respiratory:       History of dyspnea on exertion  Cardiovascular: Negative.   Gastrointestinal:       Blood noted in stool and toilet paper occasionally.  Endocrine:       History of hypothyroidism  Genitourinary: Negative.   Musculoskeletal:       Osteoarthritis with complaints of pain in multiple areas.  Neck and right shoulder pains. Sharp pain in the right shoulder and arm when she abducts and rotated at the shoulder.  Skin:       Senile ecchymoses  Allergic/Immunologic: Negative.   Neurological: Positive for tremors.       Hx seizures.   Hematological: Negative.   Psychiatric/Behavioral:       Chronic anxiety. Memory deficits.    Filed Vitals:   09/26/14 0909  BP: 100/62  Pulse: 60  Temp: 97.5 F (36.4 C)  TempSrc: Oral  Weight: 127 lb (57.607 kg)   Body mass index is 23.22 kg/(m^2).  Physical Exam  Constitutional: She is oriented to person, place, and time. She appears well-developed and well-nourished. No distress.  HENT:  Bilateral hearing loss. Rupture of the right TM.   Eyes:  Lens implants in both eyes  Neck: Normal range of motion. Neck supple. No JVD present. No tracheal deviation present. No  thyromegaly present.  Cardiovascular: Normal rate, regular rhythm, normal heart sounds and intact distal pulses.  Exam reveals no gallop and no friction rub.   No murmur heard. Pulmonary/Chest: Effort normal and breath sounds normal. No respiratory distress. She has no wheezes. She exhibits no tenderness.  Abdominal: Soft. Bowel sounds are normal. She exhibits no distension and no mass. There is no tenderness.  Genitourinary: Guaiac negative stool.  Hemorrhoids. Normal sphincter tone. Stools are normal brown color.  Musculoskeletal: Normal range of motion. She exhibits no edema or tenderness.  Tender in the posterior neck. Mild restriction in movements. Hammertoes at the left second and third. Right second toe overlaps the great toe. SP surgical repair of the right 2nd toe.  Shoulders sore. Painful in the right shoulder when she lifts overhead and rotates at the shpoulder.[facility] July 2014. Unstable gait.  Weaker grip of the right hand.  Lymphadenopathy:    She has no cervical adenopathy.  Neurological: She is alert and oriented to person, place, and time. No cranial nerve deficit. Coordination normal.  No loss of grip strength. Loss of memory. 04/04/14 MMSE 25/30. Passed clock drawing. 07/04/14 MMSE 28/30. Failed clock drawing. Bilateral tremor.  Skin: Skin is warm and dry. No rash noted. No erythema. No pallor.  Psychiatric: She has a normal mood and affect. Her behavior is normal. Thought content normal.     Labs reviewed: Abstract on 09/19/2014  Component Date Value Ref Range Status  . Glucose 09/18/2014 99   Final  . BUN 09/18/2014 24* 4 - 21 mg/dL Final  . Creatinine 09/18/2014 0.7  0.5 - 1.1 mg/dL Final  . Potassium 09/18/2014 4.1  3.4 - 5.3 mmol/L Final  . Sodium 09/18/2014 143  137 - 147 mmol/L Final  . TSH 09/18/2014 7.39* 0.41 - 5.90 uIU/mL Final  Nursing Home on 07/04/2014  Component Date Value Ref Range Status  . Glucose 06/29/2014 88   Final  . BUN 06/29/2014 25* 4 - 21  mg/dL Final  . Creatinine 06/29/2014 0.6  0.5 - 1.1 mg/dL Final  . Potassium 06/29/2014 4.1  3.4 - 5.3 mmol/L Final  . Sodium 06/29/2014 141  137 - 147 mmol/L Final  . Alkaline Phosphatase 06/29/2014 80  25 - 125 U/L Final  . ALT 06/29/2014 10  7 - 35 U/L Final  . AST 06/29/2014 19  13 - 35 U/L Final  . Bilirubin, Total 06/29/2014 0.4   Final     Assessment/Plan  1. Hypothyroidism, unspecified hypothyroidism type Patient was strongly advised to not run out of her medication again. -  levothyroxine (SYNTHROID, LEVOTHROID) 100 MCG tablet; Take 1 tablet (100 mcg total) by mouth daily before breakfast. For thyroid  Dispense: 90 tablet; Refill: 3 - TSH; Future  2. Memory loss Follow-up MMSE at future visit  3. Convulsions, unspecified convulsion type - phenytoin (DILANTIN) 100 MG ER capsule; Take 1 capsule by mouth in  the morning and 2 capsules  by mouth at bedtime to prevent seizure.  Dispense: 270 capsule; Refill: 3  4. Primary osteoarthritis of both hands - meloxicam (MOBIC) 15 MG tablet; TAKE 1 TABLET BY MOUTH ONCE DAILY TO HELP ARTHRITIS  Dispense: 90 tablet; Refill: 4  5. Depression - mirtazapine (REMERON SOL-TAB) 15 MG disintegrating tablet; One nightly to help sleep and nerves  Dispense: 90 tablet; Refill: 3

## 2014-09-28 ENCOUNTER — Other Ambulatory Visit: Payer: Self-pay | Admitting: *Deleted

## 2014-09-28 DIAGNOSIS — E039 Hypothyroidism, unspecified: Secondary | ICD-10-CM

## 2014-09-28 DIAGNOSIS — F32A Depression, unspecified: Secondary | ICD-10-CM

## 2014-09-28 DIAGNOSIS — M19042 Primary osteoarthritis, left hand: Secondary | ICD-10-CM

## 2014-09-28 DIAGNOSIS — M19041 Primary osteoarthritis, right hand: Secondary | ICD-10-CM

## 2014-09-28 DIAGNOSIS — F329 Major depressive disorder, single episode, unspecified: Secondary | ICD-10-CM

## 2014-09-28 MED ORDER — MIRTAZAPINE 15 MG PO TBDP
ORAL_TABLET | ORAL | Status: DC
Start: 2014-09-28 — End: 2015-01-12

## 2014-09-28 MED ORDER — MELOXICAM 15 MG PO TABS: ORAL_TABLET | ORAL | Status: DC

## 2014-09-28 MED ORDER — LEVOTHYROXINE SODIUM 100 MCG PO TABS: 100.0000 ug | ORAL_TABLET | Freq: Every day | ORAL | Status: AC

## 2014-09-28 NOTE — Telephone Encounter (Signed)
[facility] 

## 2014-10-19 ENCOUNTER — Telehealth: Payer: Self-pay
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
## 2014-12-19 ENCOUNTER — Non-Acute Institutional Stay (INDEPENDENT_AMBULATORY_CARE_PROVIDER_SITE_OTHER): Payer: Medicare Other | Admitting: Internal Medicine

## 2014-12-19 ENCOUNTER — Encounter: Payer: Self-pay | Admitting: Internal Medicine

## 2014-12-19 VITALS — BP 100/60 | HR 64 | Temp 97.5°F | Resp 20 | Ht 62.0 in | Wt 131.5 lb

## 2014-12-19 DIAGNOSIS — M533 Sacrococcygeal disorders, not elsewhere classified: Secondary | ICD-10-CM | POA: Diagnosis not present

## 2014-12-19 DIAGNOSIS — R2681 Unsteadiness on feet: Secondary | ICD-10-CM | POA: Diagnosis not present

## 2014-12-19 DIAGNOSIS — R413 Other amnesia: Secondary | ICD-10-CM

## 2014-12-19 DIAGNOSIS — R569 Unspecified convulsions: Secondary | ICD-10-CM | POA: Diagnosis not present

## 2014-12-19 NOTE — Progress Notes (Signed)
Patient ID: Rebecca [name], female   DOB: Oct 16, 1927, 81 y.o.   MRN: [id number]    Facility  [facility]    Place of Service:   OFFICE    Allergies  Allergen Reactions  . Morphine And Related   . Sulfa Antibiotics     Chief Complaint  Patient presents with  . Acute Visit    fell last week getting out of shower    HPI:  [place] about week ago in the shower. Pain in the left SI joint area. Had a terrible pain there yesterday. Felt like a nail in her. Unsteady on her feet. Stumbling over the legs of her walker today. Feels better after she moves around a while.  Has HC/ APAP 10/325 and tramadol on hand.  Medications: Patient's Medications  New Prescriptions   No medications on file  Previous Medications   ACETAMINOPHEN (TYLENOL) 500 MG TABLET    Take 500 mg by mouth. Take one as needed. Take 650 mg for arthritis   AMOXICILLIN (AMOXIL) 500 MG CAPSULE    Take 4 capsules one hour prior to dental appointment   CALCIUM CARB-CHOLECALCIFEROL (CALCIUM 500 +D PO)    Take by mouth. '600mg'$  - 400 units Take one twice daily   CETIRIZINE (KLS ALLER-TEC) 10 MG TABLET    Take 10 mg by mouth daily. As needed for allergies   CHOLECALCIFEROL (VITAMIN D-3) 5000 UNITS TABS    Take by mouth. Take one twice daily   CLONAZEPAM (KLONOPIN) 0.5 MG TABLET    Take 1 tablet (0.5 mg total) by mouth 2 (two) times daily as needed for anxiety.   HYDROCODONE-ACETAMINOPHEN (NORCO) 10-325 MG PER TABLET    Take 1 tablet by mouth. Take one tablet 3 times daily as needed for pain written 11/03/13 Dr. Nelva Bush   LEVOTHYROXINE (SYNTHROID, LEVOTHROID) 100 MCG TABLET    Take 1 tablet (100 mcg total) by mouth daily before breakfast. For thyroid   MELOXICAM (MOBIC) 15 MG TABLET    TAKE 1 TABLET BY MOUTH ONCE DAILY TO HELP ARTHRITIS   MIRTAZAPINE (REMERON SOL-TAB) 15 MG DISINTEGRATING TABLET    One nightly to help sleep and nerves   MOMETASONE (ELOCON) 0.1 % CREAM    Apply to outer ear once a day as needed for itching   MULTIPLE VITAMIN  (MULTIVITAMINS PO)    Take by mouth. Take one tablet daily   OMEGA-3 ACID ETHYL ESTERS (LOVAZA) 1 G CAPSULE    Take by mouth. Fatty Acid one daily   PHENYTOIN (DILANTIN) 100 MG ER CAPSULE    Take 1 capsule by mouth in  the morning and 2 capsules  by mouth at bedtime to prevent seizure.  Modified Medications   No medications on file  Discontinued Medications   No medications on file     Review of Systems  Constitutional: Negative.   HENT:       Bilateral hearing loss. June 2015 ruptured right Tm with drainage, but little pain.  Eyes: Negative.   Respiratory:       History of dyspnea on exertion  Cardiovascular: Negative.   Gastrointestinal:       Blood noted in stool and toilet paper occasionally.  Endocrine:       History of hypothyroidism  Genitourinary: Negative.   Musculoskeletal:       Osteoarthritis with complaints of pain in multiple areas. Neck and right shoulder pains. Sharp pain in the right shoulder and arm when she abducts and rotated at the shoulder. Acutely  tender in the left SI joint.  Skin:       Senile ecchymoses  Allergic/Immunologic: Negative.   Neurological: Positive for tremors.       Hx seizures.   Hematological: Negative.   Psychiatric/Behavioral:       Chronic anxiety. Memory deficits.    Filed Vitals:   12/19/14 0853  BP: 100/60  Pulse: 64  Temp: 97.5 F (36.4 C)  TempSrc: Oral  Resp: 20  Height: '5\' 2"'$  (1.575 m)  Weight: 131 lb 8 oz (59.648 kg)  SpO2: 95%   Body mass index is 24.05 kg/(m^2).  Physical Exam  Constitutional: She is oriented to person, place, and time. She appears well-developed and well-nourished. No distress.  HENT:  Bilateral hearing loss. Rupture of the right TM.   Eyes:  Lens implants in both eyes  Neck: Normal range of motion. Neck supple. No JVD present. No tracheal deviation present. No thyromegaly present.  Cardiovascular: Normal rate, regular rhythm, normal heart sounds and intact distal pulses.  Exam reveals  no gallop and no friction rub.   No murmur heard. Pulmonary/Chest: Effort normal and breath sounds normal. No respiratory distress. She has no wheezes. She exhibits no tenderness.  Abdominal: Soft. Bowel sounds are normal. She exhibits no distension and no mass. There is no tenderness.  Genitourinary: Guaiac negative stool.  Hemorrhoids. Normal sphincter tone. Stools are normal brown color.  Musculoskeletal: Normal range of motion. She exhibits no edema or tenderness.  Tender in the posterior neck. Mild restriction in movements. Hammertoes at the left second and third. Right second toe overlaps the great toe. SP surgical repair of the right 2nd toe.  Shoulders sore. Painful in the right shoulder when she lifts overhead and rotates at the shpoulder.[facility] July 2014. Unstable gait. Using walker with 2 front wheels and rear skids. Weaker grip of the right hand.. Tender in the left SI joint.  Lymphadenopathy:    She has no cervical adenopathy.  Neurological: She is alert and oriented to person, place, and time. No cranial nerve deficit. Coordination normal.  No loss of grip strength. Loss of memory. 04/04/14 MMSE 25/30. Passed clock drawing. 07/04/14 MMSE 28/30. Failed clock drawing. Bilateral tremor.  Skin: Skin is warm and dry. No rash noted. No erythema. No pallor.  Psychiatric: She has a normal mood and affect. Her behavior is normal. Thought content normal.     Labs reviewed: Abstract on 09/19/2014  Component Date Value Ref Range Status  . Glucose 09/18/2014 99   Final  . BUN 09/18/2014 24* 4 - 21 mg/dL Final  . Creatinine 09/18/2014 0.7  0.5 - 1.1 mg/dL Final  . Potassium 09/18/2014 4.1  3.4 - 5.3 mmol/L Final  . Sodium 09/18/2014 143  137 - 147 mmol/L Final  . TSH 09/18/2014 7.39* 0.41 - 5.90 uIU/mL Final     Assessment/Plan  1. Unstable gait falls  2. Convulsions, unspecified convulsion type None recently  3. Memory loss Continues with slow progression  4. Sacroiliac  joint pain Left side. Use pain medications as needed.

## 2014-12-28 DIAGNOSIS — M5126 Other intervertebral disc displacement, lumbar region: Secondary | ICD-10-CM | POA: Diagnosis not present

## 2014-12-28 DIAGNOSIS — M545 Low back pain: Secondary | ICD-10-CM | POA: Diagnosis not present

## 2015-01-03 DIAGNOSIS — H903 Sensorineural hearing loss, bilateral: Secondary | ICD-10-CM | POA: Diagnosis not present

## 2015-01-03 DIAGNOSIS — H6123 Impacted cerumen, bilateral: Secondary | ICD-10-CM | POA: Diagnosis not present

## 2015-01-11 ENCOUNTER — Encounter (HOSPITAL_COMMUNITY): Payer: Self-pay | Admitting: Emergency Medicine

## 2015-01-11 ENCOUNTER — Emergency Department (HOSPITAL_COMMUNITY): Payer: Medicare Other

## 2015-01-11 ENCOUNTER — Observation Stay (HOSPITAL_COMMUNITY)
Admission: EM | Admit: 2015-01-11 | Discharge: 2015-01-12 | Disposition: A | Discharge status: Skilled Nursing Facility | Payer: Medicare Other | Attending: Internal Medicine | Admitting: Internal Medicine

## 2015-01-11 DIAGNOSIS — Z79899 Other long term (current) drug therapy: Secondary | ICD-10-CM | POA: Insufficient documentation

## 2015-01-11 DIAGNOSIS — Z7952 Long term (current) use of systemic steroids: Secondary | ICD-10-CM | POA: Insufficient documentation

## 2015-01-11 DIAGNOSIS — F411 Generalized anxiety disorder: Secondary | ICD-10-CM | POA: Insufficient documentation

## 2015-01-11 DIAGNOSIS — M1612 Unilateral primary osteoarthritis, left hip: Secondary | ICD-10-CM | POA: Diagnosis not present

## 2015-01-11 DIAGNOSIS — Z87891 Personal history of nicotine dependence: Secondary | ICD-10-CM | POA: Diagnosis not present

## 2015-01-11 DIAGNOSIS — R262 Difficulty in walking, not elsewhere classified: Secondary | ICD-10-CM | POA: Diagnosis present

## 2015-01-11 DIAGNOSIS — M5431 Sciatica, right side: Secondary | ICD-10-CM | POA: Insufficient documentation

## 2015-01-11 DIAGNOSIS — Z87898 Personal history of other specified conditions: Secondary | ICD-10-CM

## 2015-01-11 DIAGNOSIS — M25551 Pain in right hip: Secondary | ICD-10-CM | POA: Diagnosis present

## 2015-01-11 DIAGNOSIS — Z9181 History of falling: Secondary | ICD-10-CM | POA: Insufficient documentation

## 2015-01-11 DIAGNOSIS — E559 Vitamin D deficiency, unspecified: Secondary | ICD-10-CM | POA: Diagnosis not present

## 2015-01-11 DIAGNOSIS — Z8669 Personal history of other diseases of the nervous system and sense organs: Secondary | ICD-10-CM

## 2015-01-11 DIAGNOSIS — M25552 Pain in left hip: Principal | ICD-10-CM | POA: Insufficient documentation

## 2015-01-11 DIAGNOSIS — Z96653 Presence of artificial knee joint, bilateral: Secondary | ICD-10-CM | POA: Insufficient documentation

## 2015-01-11 DIAGNOSIS — R52 Pain, unspecified: Secondary | ICD-10-CM | POA: Diagnosis not present

## 2015-01-11 DIAGNOSIS — E039 Hypothyroidism, unspecified: Secondary | ICD-10-CM | POA: Diagnosis not present

## 2015-01-11 DIAGNOSIS — R569 Unspecified convulsions: Secondary | ICD-10-CM | POA: Insufficient documentation

## 2015-01-11 DIAGNOSIS — Z791 Long term (current) use of non-steroidal anti-inflammatories (NSAID): Secondary | ICD-10-CM | POA: Insufficient documentation

## 2015-01-11 DIAGNOSIS — S79912A Unspecified injury of left hip, initial encounter: Secondary | ICD-10-CM | POA: Diagnosis not present

## 2015-01-11 LAB — CBC WITH DIFFERENTIAL/PLATELET
BASOS ABS: 0 10*3/uL (ref 0.0–0.1)
Basophils Relative: 0 % (ref 0–1)
Eosinophils Absolute: 0.3 10*3/uL (ref 0.0–0.7)
Eosinophils Relative: 6 % — ABNORMAL HIGH (ref 0–5)
HCT: 38.7 % (ref 36.0–46.0)
HEMOGLOBIN: 12.8 g/dL (ref 12.0–15.0)
LYMPHS ABS: 0.9 10*3/uL (ref 0.7–4.0)
LYMPHS PCT: 17 % (ref 12–46)
MCH: 32.9 pg (ref 26.0–34.0)
MCHC: 33.1 g/dL (ref 30.0–36.0)
MCV: 99.5 fL (ref 78.0–100.0)
Monocytes Absolute: 0.6 10*3/uL (ref 0.1–1.0)
Monocytes Relative: 12 % (ref 3–12)
NEUTROS ABS: 3.6 10*3/uL (ref 1.7–7.7)
NEUTROS PCT: 65 % (ref 43–77)
PLATELETS: 195 10*3/uL (ref 150–400)
RBC: 3.89 MIL/uL (ref 3.87–5.11)
RDW: 13.3 % (ref 11.5–15.5)
WBC: 5.5 10*3/uL (ref 4.0–10.5)

## 2015-01-11 LAB — URINALYSIS, ROUTINE W REFLEX MICROSCOPIC
Bilirubin Urine: NEGATIVE
GLUCOSE, UA: NEGATIVE mg/dL
Ketones, ur: NEGATIVE mg/dL
Nitrite: POSITIVE — AB
PH: 6 (ref 5.0–8.0)
Protein, ur: NEGATIVE mg/dL
SPECIFIC GRAVITY, URINE: 1.013 (ref 1.005–1.030)
Urobilinogen, UA: 0.2 mg/dL (ref 0.0–1.0)

## 2015-01-11 LAB — CREATININE, SERUM
CREATININE: 0.54 mg/dL (ref 0.44–1.00)
GFR calc Af Amer: >60 mL/min (ref >60)
GFR calc non Af Amer: >60 mL/min (ref >60)

## 2015-01-11 LAB — I-STAT CHEM 8, ED
BUN: 23 mg/dL — AB (ref 6–20)
CALCIUM ION: 1.16 mmol/L (ref 1.13–1.30)
CHLORIDE: 105 mmol/L (ref 101–111)
Creatinine, Ser: 0.6 mg/dL (ref 0.44–1.00)
GLUCOSE: 122 mg/dL — AB (ref 65–99)
HCT: 40 % (ref 36.0–46.0)
Hemoglobin: 13.6 g/dL (ref 12.0–15.0)
POTASSIUM: 4 mmol/L (ref 3.5–5.1)
SODIUM: 142 mmol/L (ref 135–145)
TCO2: 25 mmol/L (ref 0–100)

## 2015-01-11 LAB — CBC
HEMATOCRIT: 38.3 % (ref 36.0–46.0)
HEMOGLOBIN: 12.7 g/dL (ref 12.0–15.0)
MCH: 33.1 pg (ref 26.0–34.0)
MCHC: 33.2 g/dL (ref 30.0–36.0)
MCV: 99.7 fL (ref 78.0–100.0)
Platelets: 210 10*3/uL (ref 150–400)
RBC: 3.84 MIL/uL — ABNORMAL LOW (ref 3.87–5.11)
RDW: 13.1 % (ref 11.5–15.5)
WBC: 6.8 10*3/uL (ref 4.0–10.5)

## 2015-01-11 LAB — URINE MICROSCOPIC-ADD ON

## 2015-01-11 MED ORDER — POLYETHYLENE GLYCOL 3350 17 G PO PACK: 17.0000 g | PACK | Freq: Every day | ORAL | Status: DC | PRN

## 2015-01-11 MED ORDER — MELOXICAM 15 MG PO TABS
15.0000 mg | ORAL_TABLET | Freq: Every day | ORAL | Status: DC
  Administered 2015-01-12: 15 mg via ORAL
  Filled 2015-01-11: qty 1

## 2015-01-11 MED ORDER — PHENYTOIN SODIUM EXTENDED 100 MG PO CAPS: 100.0000 mg | ORAL_CAPSULE | Freq: Every day | ORAL | Status: DC

## 2015-01-11 MED ORDER — ONDANSETRON HCL 4 MG/2ML IJ SOLN
4.0000 mg | Freq: Four times a day (QID) | INTRAMUSCULAR | Status: DC | PRN
  Administered 2015-01-11: 4 mg via INTRAVENOUS
  Filled 2015-01-11: qty 2

## 2015-01-11 MED ORDER — ACETAMINOPHEN 650 MG RE SUPP: 650.0000 mg | Freq: Four times a day (QID) | RECTAL | Status: DC | PRN

## 2015-01-11 MED ORDER — OMEGA-3-ACID ETHYL ESTERS 1 G PO CAPS
1.0000 g | ORAL_CAPSULE | Freq: Every day | ORAL | Status: DC
  Administered 2015-01-12: 1 g via ORAL
  Filled 2015-01-11: qty 1

## 2015-01-11 MED ORDER — PHENYTOIN SODIUM EXTENDED 100 MG PO CAPS
100.0000 mg | ORAL_CAPSULE | Freq: Every day | ORAL | Status: DC
  Administered 2015-01-12: 100 mg via ORAL
  Filled 2015-01-11: qty 1

## 2015-01-11 MED ORDER — LEVOTHYROXINE SODIUM 100 MCG PO TABS
100.0000 ug | ORAL_TABLET | Freq: Every day | ORAL | Status: DC
  Administered 2015-01-12: 100 ug via ORAL
  Filled 2015-01-11 (×2): qty 1

## 2015-01-11 MED ORDER — PHENYTOIN SODIUM EXTENDED 100 MG PO CAPS
200.0000 mg | ORAL_CAPSULE | Freq: Every day | ORAL | Status: DC
  Administered 2015-01-11: 200 mg via ORAL
  Filled 2015-01-11 (×2): qty 2

## 2015-01-11 MED ORDER — MULTIVITAMINS PO CAPS: 1.0000 | ORAL_CAPSULE | Freq: Every day | ORAL | Status: DC

## 2015-01-11 MED ORDER — ONDANSETRON HCL 4 MG PO TABS: 4.0000 mg | ORAL_TABLET | Freq: Four times a day (QID) | ORAL | Status: DC | PRN

## 2015-01-11 MED ORDER — TRAMADOL HCL 50 MG PO TABS
50.0000 mg | ORAL_TABLET | Freq: Four times a day (QID) | ORAL | Status: DC | PRN
  Administered 2015-01-11 – 2015-01-12 (×3): 50 mg via ORAL
  Filled 2015-01-11 (×3): qty 1

## 2015-01-11 MED ORDER — LORATADINE 10 MG PO TABS
10.0000 mg | ORAL_TABLET | Freq: Every day | ORAL | Status: DC
  Administered 2015-01-12: 10 mg via ORAL
  Filled 2015-01-11: qty 1

## 2015-01-11 MED ORDER — ADULT MULTIVITAMIN W/MINERALS CH
1.0000 | ORAL_TABLET | Freq: Every day | ORAL | Status: DC
  Administered 2015-01-12: 1 via ORAL
  Filled 2015-01-11: qty 1

## 2015-01-11 MED ORDER — ALUM & MAG HYDROXIDE-SIMETH 200-200-20 MG/5ML PO SUSP
30.0000 mL | Freq: Four times a day (QID) | ORAL | Status: DC | PRN
Start: 2015-01-11 — End: 2015-01-12

## 2015-01-11 MED ORDER — ENOXAPARIN SODIUM 40 MG/0.4ML ~~LOC~~ SOLN
40.0000 mg | SUBCUTANEOUS | Status: DC
  Administered 2015-01-11: 40 mg via SUBCUTANEOUS
  Filled 2015-01-11 (×2): qty 0.4

## 2015-01-11 MED ORDER — ACETAMINOPHEN 325 MG PO TABS
650.0000 mg | ORAL_TABLET | Freq: Four times a day (QID) | ORAL | Status: DC | PRN
  Administered 2015-01-12: 650 mg via ORAL
  Filled 2015-01-11: qty 2

## 2015-01-11 MED ORDER — FENTANYL CITRATE (PF) 100 MCG/2ML IJ SOLN
75.0000 ug | INTRAMUSCULAR | Status: DC | PRN
  Administered 2015-01-11: 75 ug via INTRAMUSCULAR
  Filled 2015-01-11: qty 2

## 2015-01-11 MED ORDER — VITAMIN D3 25 MCG (1000 UNIT) PO TABS
5000.0000 [IU] | ORAL_TABLET | Freq: Every day | ORAL | Status: DC
  Administered 2015-01-12: 5000 [IU] via ORAL
  Filled 2015-01-11: qty 5

## 2015-01-11 MED ORDER — VITAMIN D-3 125 MCG (5000 UT) PO TABS: 5000.0000 [IU] | ORAL_TABLET | Freq: Every day | ORAL | Status: DC

## 2015-01-11 NOTE — Clinical Social Work Note (Signed)
Clinical Social Work Assessment  Patient Details  Name: Rebecca [name] MRN: [id number] Date of Birth: November 24, 1927  Date of referral:  01/11/15               Reason for consult:   (Patient is from [place] sought to share information with:   (None.) Permission granted to share information::  No  Name::        Agency::     Relationship::     Contact Information:     Housing/Transportation Living arrangements for the past 2 months:  [place] of Information:  Patient, Adult Children Patient Interpreter Needed:  None Criminal Activity/Legal Involvement Pertinent to Current Situation/Hospitalization:  No - Comment as needed Significant Relationships:  Adult Children Lives with:    Do you feel safe going back to the place where you live?  No (Son would like patient to be transferred to the assited living unit of the facility.) Need for family participation in patient care:  Yes (Comment) (Son and daughter state that they are primary support for patient. )  Care giving concerns:  The patient is currently a resident at [facility], and is residing in the [place] Unit. Son states that he would like for the patient to be moved to the facility's Assisted Living Unit.   Social Worker assessment / plan:  CSW met with patient at bedside. Son and daughter were present. Patient and family confirm that she presents from [facility]. They state that the patient has been living there for almost 5 years. Also, patient confirms that she presents to [facility] due to hip pain.  Patient states that she has fallen x2 within 6 months. According to family and patient, she has been able to complete her ADL's independently prior to this morning.  Patient state that her primary support is her her children.  Employment status:  Retired Forensic scientist:   (Medicare A & B.) PT Recommendations:  Not assessed at this time Information /  Referral to community resources:   (Patient is currently living at facility.)  Patient/Family's Response to care:  Patient and family's response is appropriate at this time. They are aware of admission. They state that they are accepting at this time. Son states that he would like for patient to be discharged to assisted living unit.  Patient/Family's Understanding of and Emotional Response to Diagnosis, Current Treatment, and Prognosis:  Patient and family are understanding at this time. They state that they do not have any questions for CSW at this time.   Emotional Assessment Appearance:  Appears stated age Attitude/Demeanor/Rapport:   (Appropriate.) Affect (typically observed):  Accepting, Appropriate Orientation:  Oriented to Self, Oriented to Place, Oriented to  Time, Oriented to Situation Alcohol / Substance use:  Not Applicable Psych involvement (Current and /or in the community):  No (Comment)  Discharge Needs  Concerns to be addressed:  Other (Comment Required (Son would like for patient to be moved to the assisted living unit at [facility].) Readmission within the last 30 days:  No Current discharge risk:  None Barriers to Discharge:  No Barriers Identified   Bernita Buffy, LCSW 01/11/2015, 11:23 PM

## 2015-01-11 NOTE — Progress Notes (Signed)
CSW reached out to [employer] [facility] [place] and Retail banker of [facility] of [facility] to determine if the patient would be welcomed to move to their assisted living unit. However, CSW did not get an answer. CSW left call back number.  CSW will continue to follow up.  Willette Brace [phone] ED CSW 01/11/2015 5:00 PM

## 2015-01-11 NOTE — ED Notes (Signed)
Pt fell in shower two weeks ago, has seen primary care MD, xr were negative, using walker with pain, had another fall one week ago and now L hip pain worsening. Pt states she felt a pop / click in the L hip this morning when she rolled over while lying in bed.

## 2015-01-11 NOTE — ED Notes (Signed)
Bed: WA01 Expected date:  Expected time:  Means of arrival:  Comments: Ems

## 2015-01-11 NOTE — H&P (Signed)
History and Physical:    Rebecca [name]   IRW:[id number] DOB: April 23, 1928 DOA: 01/11/2015  Referring MD/provider: Varney Biles, MD PCP: Estill Dooms, MD   Chief Complaint: Left hip pain with inability to walk  History of Present Illness:   Rebecca [name] is an 81 y.o. female  Who presents with a chief complaint of left sided hip pain that began suddenly when ambulating with a walker at a grocery store earlier today.  The patient tells me she has had two falls in the last 6 weeks, once in the shower when she landed on her left side/buttocks and 3 weeks ago, when she fell out of bed and landed on her right side.  She was unable to get up on her own, and "spent the night on the floor".   She describes the pain as "sharp" and exacerbated by walking and bearing weight.  In the ED, the patient had a CT of the hip that was unremarkable however she could not ambulate independently. She subsequently was referred for an observation admit.  ROS:   Review of Systems  Constitutional: Negative for fever, chills, weight loss and malaise/fatigue.  HENT: Positive for hearing loss.   Eyes: Negative.   Respiratory: Negative.   Cardiovascular: Negative.   Gastrointestinal: Negative.   Genitourinary: Negative.   Musculoskeletal: Positive for myalgias, back pain, joint pain and falls.  Skin:       Ecchymosis lower extremities  Neurological: Positive for seizures. Negative for dizziness and weakness.       Last seizure in the 1960s.  Endo/Heme/Allergies: Bruises/bleeds easily.  Psychiatric/Behavioral: Negative.      Past Medical History:   Past Medical History  Diagnosis Date  . Osteoporosis 02/2011  . Seizures   . DJD (degenerative joint disease)   . Thyroid disease   . Diverticulosis 02/2011  . Hemorrhoids 02/2011  . Dizziness   . Low back pain   . Mitral valve problem thickened    thickened  . Unspecified hypothyroidism 01/2011  . 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 Vitamin D deficiency 01/2011  . Anemia, unspecified 03/18/2011  . Anxiety state, unspecified 01/2011  . 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 Pain in joint, site unspecified 01/2012  . Sacroiliitis, not elsewhere classified 05/2011  . Dysuria 06/17/2011  . Other abnormal blood chemistry 0/30/2012  . Blood in stool 06/15/2012  . Other acquired deformity of toe 12/16/2011  . Corns and callosities 07/01/2011    Past Surgical History:   Past Surgical History  Procedure Laterality Date  . Cervical laminectomy  2005  . Bletheroplasty    . Hammer toe surgery  2013    right 2nd toe Dr. Mallie Mussel  . Joint replacement Bilateral 2002 Right, 1992 Left    knees  . Cataract extraction w/ intraocular lens  implant, bilateral  2010    Social History:   Social History   Social History  . Marital Status: Widowed    Spouse Name: N/A  . Number of Children: N/A  . Years of Education: N/A   Occupational History  . Not on file.   Social History Main Topics  . Smoking status: Former Smoker    Quit date: 09/14/1988  . Smokeless tobacco: Never Used  . Alcohol Use: 0.6 oz/week    1 Glasses of wine per week     Comment: twice a month  . Drug Use: No  . Sexual Activity: No  Other Topics Concern  . Not on file   Social History Narrative   Lives at [facility] since 10/07/2010   Widowed   Exercise class 3 times a week     Never smoked   Alcholol wine social    POA, Living Will    Family history:   Family History  Problem Relation Age of Onset  . Alzheimer's disease Sister   . Emphysema Sister     Allergies   Morphine and related and Sulfa antibiotics  Current Medications:   Prior to Admission medications   Medication Sig Start Date End Date Taking? Authorizing Provider  acetaminophen (TYLENOL) 650 MG CR tablet Take 650 mg by mouth daily as needed for pain.   Yes Historical Provider, MD  amoxicillin (AMOXIL) 500 MG capsule Take 4  capsules one hour prior to dental appointment Patient taking differently: Take 2,000 mg by mouth once. Take 4 capsules one hour prior to dental appointment 07/04/14  Yes Estill Dooms, MD  CALCIUM PO Take 1 tablet by mouth daily.   Yes Historical Provider, MD  cetirizine (KLS ALLER-TEC) 10 MG tablet Take 10 mg by mouth daily. As needed for allergies   Yes Historical Provider, MD  Cholecalciferol (VITAMIN D-3) 5000 UNITS TABS Take 5,000 Units by mouth daily.    Yes Historical Provider, MD  levothyroxine (SYNTHROID, LEVOTHROID) 100 MCG tablet Take 1 tablet (100 mcg total) by mouth daily before breakfast. For thyroid 09/28/14  Yes Estill Dooms, MD  meloxicam (MOBIC) 15 MG tablet TAKE 1 TABLET BY MOUTH ONCE DAILY TO HELP ARTHRITIS Patient taking differently: Take 15 mg by mouth daily.  09/28/14  Yes Estill Dooms, MD  Multiple Vitamin (MULTIVITAMINS PO) Take 1 tablet by mouth daily. Take one tablet daily   Yes Historical Provider, MD  omega-3 acid ethyl esters (LOVAZA) 1 G capsule Take 1 g by mouth daily. Fatty Acid one daily   Yes Historical Provider, MD  phenytoin (DILANTIN) 100 MG ER capsule Take 1 capsule by mouth in  the morning and 2 capsules  by mouth at bedtime to prevent seizure. Patient taking differently: Take 100-200 mg by mouth at bedtime. Take 100 mg in the morning and 200 mg by mouth at bedtime 09/26/14  Yes Estill Dooms, MD  traMADol (ULTRAM) 50 MG tablet Take 50 mg by mouth every 6 (six) hours as needed for moderate pain or severe pain.   Yes Historical Provider, MD  clonazePAM (KLONOPIN) 0.5 MG tablet Take 1 tablet (0.5 mg total) by mouth 2 (two) times daily as needed for anxiety. Patient not taking: Reported on 01/11/2015 04/08/14   Estill Dooms, MD  mirtazapine (REMERON SOL-TAB) 15 MG disintegrating tablet One nightly to help sleep and nerves Patient not taking: Reported on 12/19/2014 09/28/14   Estill Dooms, MD  mometasone (ELOCON) 0.1 % cream Apply to outer ear once a day as  needed for itching 11/16/13   Historical Provider, MD    Physical Exam:   Filed Vitals:   01/11/15 1046 01/11/15 1426 01/11/15 1556  BP: 116/60 117/61 104/80  Pulse: 72 66 67  Temp: 97.9 F (36.6 C)    TempSrc: Oral    Resp: '20 18 18  '$ SpO2: 100% 99% 99%     Physical Exam: Blood pressure 104/80, pulse 67, temperature 97.9 F (36.6 C), temperature source Oral, resp. rate 18, SpO2 99 %. Gen: No acute distress. Head: Normocephalic, atraumatic. Eyes: PERRL, EOMI, sclerae nonicteric. Mouth: Oropharynx clear. Neck:  Supple, no thyromegaly, no lymphadenopathy, no jugular venous distention. Chest: Lungs clear to auscultation bilaterally. CV: Heart sounds are regular. No murmurs, rubs, or gallops. Abdomen: Soft, nontender, nondistended with normal active bowel sounds. Extremities: Extremities are without clubbing, edema, or cyanosis. Skin: Warm and dry. Ecchymotic areas to the right lower extremity. Neuro: Alert and oriented times 3; grossly nonfocal except for hard of hearing. 5/5 strength lower extremity muscles. Psych: Mood and affect normal.   Data Review:    Labs: Basic Metabolic Panel:  Recent Labs Lab 01/11/15 1643  NA 142  K 4.0  CL 105  GLUCOSE 122*  BUN 23*  CREATININE 0.60   CBC:  Recent Labs Lab 01/11/15 1634 01/11/15 1643  WBC 5.5  --   NEUTROABS 3.6  --   HGB 12.8 13.6  HCT 38.7 40.0  MCV 99.5  --   PLT 195  --     Radiographic Studies: Ct Hip Left Wo Contrast  01/11/2015   CLINICAL DATA:  Status post fall in the shower 2 weeks ago with a second fall 1 week ago. The patient reports she felt a pop/click in the left hip this morning when rolling over in bed. The patient is unable to walk. Initial encounter.  EXAM: CT OF THE LEFT HIP WITHOUT CONTRAST  TECHNIQUE: Multidetector CT imaging of the left hip was performed according to the standard protocol. Multiplanar CT image reconstructions were also generated.  COMPARISON:  Plain films left hip this same  day.  FINDINGS: No fracture, dislocation or other acute bony or joint abnormality is identified. Mild to moderate degenerative change is seen about the left hip and the symphysis pubis. No hip joint effusion is noted. Imaged musculature appears normal. Imaged intrapelvic contents demonstrate no acute abnormality with atherosclerosis noted.  IMPRESSION: No acute abnormality.  Mild to moderate degenerative disease about the left hip and symphysis pubis.   Electronically Signed   By: Inge Rise M.D.   On: 01/11/2015 15:36   Dg Hip Unilat With Pelvis 2-3 Views Left  01/11/2015   CLINICAL DATA:  Left hip pain after fall 6 weeks ago.  EXAM: DG HIP (WITH OR WITHOUT PELVIS) 2-3V LEFT  COMPARISON:  None.  FINDINGS: There is no evidence of hip fracture or dislocation. Mild narrowing of left hip joint is noted with minimal osteophyte formation.  IMPRESSION: Mild degenerative joint disease of left hip. No acute abnormality seen.   Electronically Signed   By: Marijo Conception, M.D.   On: 01/11/2015 14:09     EKG: None ordered.   Assessment/Plan:   Principal Problem:   Left hip pain - Possibly from acute tendinitis or strained muscle. No abnormality seen on CT. No loss of strength. - Use Ultram and Tylenol as needed for pain control. - PT evaluation requested.  Active Problems:   Hypothyroidism - Continue Synthroid.    Inability to ambulate due to left hip - PT evaluation. Social work consult for discharge to assisted living.    H/O idiopathic seizure - Continue Dilantin.    DVT prophylaxis - Lovenox ordered.  Code Status: Full. Family Communication: Maghan Jessee (son) and Wilfred Lacy (daughter) Disposition Plan: From Independent Living Facillty at [facility].  Time spent: 45 minutes.  Careem Yasui [facility] Pager [phone] Cell: [phone]   If 7PM-7AM, please contact night-coverage [email or web address] Password [facility] 01/11/2015, 5:53 PM

## 2015-01-11 NOTE — Progress Notes (Signed)
[facility] discussed patient with EDP.  Per EDP patient's facility requiring paperwork such as FL2, PT evaluation and discharge summary so that patient may be moved to ALF section of facility.  EDCM called and left message for PT for patient to be evaulated in ED however, PT department left at 1630pm.  EDSW unable to reach SW at facility.  Per chart review, note stated that patient was able to ambulate with assistance, however per [facility] patient unable to ambulate due to 10/10 pain.

## 2015-01-11 NOTE — Discharge Instructions (Signed)
We recommend that you take anti-inflammatory meds (motrin, ibuprofen) as tolerated. Get Physical therapy team to evaluate you.   Arthritis, Nonspecific Arthritis is inflammation of a joint. This usually means pain, redness, warmth or swelling are present. One or more joints may be involved. There are a number of types of arthritis. Your caregiver may not be able to tell what type of arthritis you have right away. CAUSES  The most common cause of arthritis is the wear and tear on the joint (osteoarthritis). This causes damage to the cartilage, which can break down over time. The knees, hips, back and neck are most often affected by this type of arthritis. Other types of arthritis and common causes of joint pain include:  Sprains and other injuries near the joint. Sometimes minor sprains and injuries cause pain and swelling that develop hours later.  Rheumatoid arthritis. This affects hands, feet and knees. It usually affects both sides of your body at the same time. It is often associated with chronic ailments, fever, weight loss and general weakness.  Crystal arthritis. Gout and pseudo gout can cause occasional acute severe pain, redness and swelling in the foot, ankle, or knee.  Infectious arthritis. Bacteria can get into a joint through a break in overlying skin. This can cause infection of the joint. Bacteria and viruses can also spread through the blood and affect your joints.  Drug, infectious and allergy reactions. Sometimes joints can become mildly painful and slightly swollen with these types of illnesses. SYMPTOMS   Pain is the main symptom.  Your joint or joints can also be red, swollen and warm or hot to the touch.  You may have a fever with certain types of arthritis, or even feel overall ill.  The joint with arthritis will hurt with movement. Stiffness is present with some types of arthritis. DIAGNOSIS  Your caregiver will suspect arthritis based on your description of your  symptoms and on your exam. Testing may be needed to find the type of arthritis:  Blood and sometimes urine tests.  X-ray tests and sometimes CT or MRI scans.  Removal of fluid from the joint (arthrocentesis) is done to check for bacteria, crystals or other causes. Your caregiver (or a specialist) will numb the area over the joint with a local anesthetic, and use a needle to remove joint fluid for examination. This procedure is only minimally uncomfortable.  Even with these tests, your caregiver may not be able to tell what kind of arthritis you have. Consultation with a specialist (rheumatologist) may be helpful. TREATMENT  Your caregiver will discuss with you treatment specific to your type of arthritis. If the specific type cannot be determined, then the following general recommendations may apply. Treatment of severe joint pain includes:  Rest.  Elevation.  Anti-inflammatory medication (for example, ibuprofen) may be prescribed. Avoiding activities that cause increased pain.  Only take over-the-counter or prescription medicines for pain and discomfort as recommended by your caregiver.  Cold packs over an inflamed joint may be used for 10 to 15 minutes every hour. Hot packs sometimes feel better, but do not use overnight. Do not use hot packs if you are diabetic without your caregiver's permission.  A cortisone shot into arthritic joints may help reduce pain and swelling.  Any acute arthritis that gets worse over the next 1 to 2 days needs to be looked at to be sure there is no joint infection. Long-term arthritis treatment involves modifying activities and lifestyle to reduce joint stress jarring. This can  include weight loss. Also, exercise is needed to nourish the joint cartilage and remove waste. This helps keep the muscles around the joint strong. HOME CARE INSTRUCTIONS   Do not take aspirin to relieve pain if gout is suspected. This elevates uric acid levels.  Only take  over-the-counter or prescription medicines for pain, discomfort or fever as directed by your caregiver.  Rest the joint as much as possible.  If your joint is swollen, keep it elevated.  Use crutches if the painful joint is in your leg.  Drinking plenty of fluids may help for certain types of arthritis.  Follow your caregiver's dietary instructions.  Try low-impact exercise such as:  Swimming.  Water aerobics.  Biking.  Walking.  Morning stiffness is often relieved by a warm shower.  Put your joints through regular range-of-motion. SEEK MEDICAL CARE IF:   You do not feel better in 24 hours or are getting worse.  You have side effects to medications, or are not getting better with treatment. SEEK IMMEDIATE MEDICAL CARE IF:   You have a fever.  You develop severe joint pain, swelling or redness.  Many joints are involved and become painful and swollen.  There is severe back pain and/or leg weakness.  You have loss of bowel or bladder control. Document Released: 06/12/2004 Document Revised: 07/28/2011 Document Reviewed: 06/28/2008 [facility] Patient Information 2015 [place], [place]. This information is not intended to replace advice given to you by your health care provider. Make sure you discuss any questions you have with your health care provider. Radicular Pain Radicular pain in either the arm or leg is usually from a bulging or herniated disk in the spine. A piece of the herniated disk may press against the nerves as the nerves exit the spine. This causes pain which is felt at the tips of the nerves down the arm or leg. Other causes of radicular pain may include:  Fractures.  Heart disease.  Cancer.  An abnormal and usually degenerative state of the nervous system or nerves (neuropathy). Diagnosis may require CT or MRI scanning to determine the primary cause.  Nerves that start at the neck (nerve roots) may cause radicular pain in the outer shoulder and arm. It  can spread down to the thumb and fingers. The symptoms vary depending on which nerve root has been affected. In most cases radicular pain improves with conservative treatment. Neck problems may require physical therapy, a neck collar, or cervical traction. Treatment may take many weeks, and surgery may be considered if the symptoms do not improve.  Conservative treatment is also recommended for sciatica. Sciatica causes pain to radiate from the lower back or buttock area down the leg into the foot. Often there is a history of back problems. Most patients with sciatica are better after 2 to 4 weeks of rest and other supportive care. Short term bed rest can reduce the disk pressure considerably. Sitting, however, is not a good position since this increases the pressure on the disk. You should avoid bending, lifting, and all other activities which make the problem worse. Traction can be used in severe cases. Surgery is usually reserved for patients who do not improve within the first months of treatment. Only take over-the-counter or prescription medicines for pain, discomfort, or fever as directed by your caregiver. Narcotics and muscle relaxants may help by relieving more severe pain and spasm and by providing mild sedation. Cold or massage can give significant relief. Spinal manipulation is not recommended. It can increase the  degree of disc protrusion. Epidural steroid injections are often effective treatment for radicular pain. These injections deliver medicine to the spinal nerve in the space between the protective covering of the spinal cord and back bones (vertebrae). Your caregiver can give you more information about steroid injections. These injections are most effective when given within two weeks of the onset of pain.  You should see your caregiver for follow up care as recommended. A program for neck and back injury rehabilitation with stretching and strengthening exercises is an important part of  management.  SEEK IMMEDIATE MEDICAL CARE IF:  You develop increased pain, weakness, or numbness in your arm or leg.  You develop difficulty with bladder or bowel control.  You develop abdominal pain. Document Released: 06/12/2004 Document Revised: 07/28/2011 Document Reviewed: 08/28/2008 [facility] Patient Information 2015 [place], [place]. This information is not intended to replace advice given to you by your health care provider. Make sure you discuss any questions you have with your health care provider.

## 2015-01-11 NOTE — ED Notes (Signed)
Pt c/o N/V. hospitalist paged for anti-nausea med.awaiting return call

## 2015-01-11 NOTE — ED Notes (Signed)
Pt is requesting pain meds. Tried to ambulate pt out of room and down hall. Pt c/o of sharp pains in right hamstring. Pt walked to door and stated she couldn't walk anymore due to the pain. Will notify RN.

## 2015-01-11 NOTE — ED Notes (Addendum)
Pt is not ambulatory at this time. Pt unable to ambulate from bed to door without 10/10 pain. Pt is fall risk

## 2015-01-11 NOTE — ED Provider Notes (Addendum)
CSN: [id number]     Arrival date & time 01/11/15  1038 History   First MD Initiated Contact with Patient 01/11/15 1046     Chief Complaint  Patient presents with  . Hip Pain    left hip     (Consider location/radiation/quality/duration/timing/severity/associated sxs/prior Treatment) HPI Comments: Pt comes in with cc of L sided hip pain. Pt has had fall x 2, mechanical over the past month. She reports that her last fall was 2 weeks ago. PT lives at an independent living center. She is normally able to ambulate. Today she started having R sided hip pain, radiating down her leg posteriorly. The pain is worse with ambulation. There is no hx of sciatica. There is no complains of back pain. No new falls or trauma prior to the current episode.  Patient is a 81 y.o. female presenting with hip pain. The history is provided by the patient.  Hip Pain Pertinent negatives include no chest pain, no abdominal pain, no headaches and no shortness of breath.    Past Medical History  Diagnosis Date  . Osteoporosis 02/2011  . Seizures   . DJD (degenerative joint disease)   . Thyroid disease   . Diverticulosis 02/2011  . Hemorrhoids 02/2011  . Dizziness   . Low back pain   . Mitral valve problem thickened    thickened  . Unspecified hypothyroidism 01/2011  . 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 Vitamin D deficiency 01/2011  . Anemia, unspecified 03/18/2011  . Anxiety state, unspecified 01/2011  . 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 Pain in joint, site unspecified 01/2012  . Sacroiliitis, not elsewhere classified 05/2011  . Dysuria 06/17/2011  . Other abnormal blood chemistry 0/30/2012  . Blood in stool 06/15/2012  . Other acquired deformity of toe 12/16/2011  . Corns and callosities 07/01/2011   Past Surgical History  Procedure Laterality Date  . Cervical laminectomy  2005  . Bletheroplasty    . Hammer toe surgery  2013    right 2nd toe  Dr. Mallie Mussel  . Joint replacement Bilateral 2002 Right, 1992 Left    knees  . Cataract extraction w/ intraocular lens  implant, bilateral  2010   Family History  Problem Relation Age of Onset  . Alzheimer's disease Sister   . Emphysema Sister    Social History  Substance Use Topics  . Smoking status: Former Smoker    Quit date: 09/14/1988  . Smokeless tobacco: Never Used  . Alcohol Use: 0.6 oz/week    1 Glasses of wine per week     Comment: twice a month   OB History    No data available     Review of Systems  Constitutional: Negative for activity change.  Respiratory: Negative for shortness of breath.   Cardiovascular: Negative for chest pain.  Gastrointestinal: Negative for nausea, vomiting and abdominal pain.  Genitourinary: Negative for dysuria.  Musculoskeletal: Positive for arthralgias. Negative for neck pain.  Neurological: Negative for numbness and headaches.      Allergies  Morphine and related and Sulfa antibiotics  Home Medications   Prior to Admission medications   Medication Sig Start Date End Date Taking? Authorizing Provider  acetaminophen (TYLENOL) 650 MG CR tablet Take 650 mg by mouth daily as needed for pain.   Yes Historical Provider, MD  amoxicillin (AMOXIL) 500 MG capsule Take 4 capsules one hour prior to dental appointment Patient taking differently: Take 2,000 mg by mouth once.  Take 4 capsules one hour prior to dental appointment 07/04/14  Yes Estill Dooms, MD  CALCIUM PO Take 1 tablet by mouth daily.   Yes Historical Provider, MD  cetirizine (KLS ALLER-TEC) 10 MG tablet Take 10 mg by mouth daily. As needed for allergies   Yes Historical Provider, MD  Cholecalciferol (VITAMIN D-3) 5000 UNITS TABS Take 5,000 Units by mouth daily.    Yes Historical Provider, MD  levothyroxine (SYNTHROID, LEVOTHROID) 100 MCG tablet Take 1 tablet (100 mcg total) by mouth daily before breakfast. For thyroid 09/28/14  Yes Estill Dooms, MD  meloxicam (MOBIC) 15 MG  tablet TAKE 1 TABLET BY MOUTH ONCE DAILY TO HELP ARTHRITIS Patient taking differently: Take 15 mg by mouth daily.  09/28/14  Yes Estill Dooms, MD  Multiple Vitamin (MULTIVITAMINS PO) Take 1 tablet by mouth daily. Take one tablet daily   Yes Historical Provider, MD  omega-3 acid ethyl esters (LOVAZA) 1 G capsule Take 1 g by mouth daily. Fatty Acid one daily   Yes Historical Provider, MD  phenytoin (DILANTIN) 100 MG ER capsule Take 1 capsule by mouth in  the morning and 2 capsules  by mouth at bedtime to prevent seizure. Patient taking differently: Take 100-200 mg by mouth at bedtime. Take 100 mg in the morning and 200 mg by mouth at bedtime 09/26/14  Yes Estill Dooms, MD  traMADol (ULTRAM) 50 MG tablet Take 50 mg by mouth every 6 (six) hours as needed for moderate pain or severe pain.   Yes Historical Provider, MD  clonazePAM (KLONOPIN) 0.5 MG tablet Take 1 tablet (0.5 mg total) by mouth 2 (two) times daily as needed for anxiety. Patient not taking: Reported on 01/11/2015 04/08/14   Estill Dooms, MD  mirtazapine (REMERON SOL-TAB) 15 MG disintegrating tablet One nightly to help sleep and nerves Patient not taking: Reported on 12/19/2014 09/28/14   Estill Dooms, MD  mometasone (ELOCON) 0.1 % cream Apply to outer ear once a day as needed for itching 11/16/13   Historical Provider, MD   BP 104/80 mmHg  Pulse 67  Temp(Src) 97.9 F (36.6 C) (Oral)  Resp 18  SpO2 99% Physical Exam  Constitutional: She is oriented to person, place, and time. She appears well-developed.  HENT:  Head: Normocephalic and atraumatic.  Eyes: EOM are normal.  Neck: Normal range of motion. Neck supple.  Cardiovascular: Normal rate.   Pulmonary/Chest: Effort normal.  Abdominal: Bowel sounds are normal.  Musculoskeletal:  L sided posterior hip tenderness. Neg passive straight leg raise. + tenderness with external hip rotation - similar to what she gets when she is ambulating. The ROM is fairly normal.   Neurological:  She is alert and oriented to person, place, and time.  Skin: Skin is warm and dry.  Nursing note and vitals reviewed.   ED Course  Procedures (including critical care time) Labs Review Labs Reviewed  URINALYSIS, ROUTINE W REFLEX MICROSCOPIC (NOT AT [facility]) - Abnormal; Notable for the following:    APPearance CLOUDY (*)    Hgb urine dipstick TRACE (*)    Nitrite POSITIVE (*)    Leukocytes, UA LARGE (*)    All other components within normal limits  URINE MICROSCOPIC-ADD ON - Abnormal; Notable for the following:    Squamous Epithelial / LPF MANY (*)    Bacteria, UA MANY (*)    All other components within normal limits  CBC WITH DIFFERENTIAL/PLATELET - Abnormal; Notable for the following:    Eosinophils Relative  6 (*)    All other components within normal limits  I-STAT CHEM 8, ED - Abnormal; Notable for the following:    BUN 23 (*)    Glucose, Bld 122 (*)    All other components within normal limits  URINE CULTURE    Imaging Review Ct Hip Left Wo Contrast  01/11/2015   CLINICAL DATA:  Status post fall in the shower 2 weeks ago with a second fall 1 week ago. The patient reports she felt a pop/click in the left hip this morning when rolling over in bed. The patient is unable to walk. Initial encounter.  EXAM: CT OF THE LEFT HIP WITHOUT CONTRAST  TECHNIQUE: Multidetector CT imaging of the left hip was performed according to the standard protocol. Multiplanar CT image reconstructions were also generated.  COMPARISON:  Plain films left hip this same day.  FINDINGS: No fracture, dislocation or other acute bony or joint abnormality is identified. Mild to moderate degenerative change is seen about the left hip and the symphysis pubis. No hip joint effusion is noted. Imaged musculature appears normal. Imaged intrapelvic contents demonstrate no acute abnormality with atherosclerosis noted.  IMPRESSION: No acute abnormality.  Mild to moderate degenerative disease about the left hip and symphysis  pubis.   Electronically Signed   By: Inge Rise M.D.   On: 01/11/2015 15:36   Dg Hip Unilat With Pelvis 2-3 Views Left  01/11/2015   CLINICAL DATA:  Left hip pain after fall 6 weeks ago.  EXAM: DG HIP (WITH OR WITHOUT PELVIS) 2-3V LEFT  COMPARISON:  None.  FINDINGS: There is no evidence of hip fracture or dislocation. Mild narrowing of left hip joint is noted with minimal osteophyte formation.  IMPRESSION: Mild degenerative joint disease of left hip. No acute abnormality seen.   Electronically Signed   By: Marijo Conception, M.D.   On: 01/11/2015 14:09   I have personally reviewed and evaluated these images and lab results as part of my medical decision-making.   EKG Interpretation None      '@4'$ :15- unable to ambulate. Independent living now states that they cannot escalate w/o proper paperwork, but states that obs admission will allow for escalation since pt is at a self pay site. Will admit. Son aware of the plan.  '@5'$ :00- Our Case management got SW involved, but unable to get PT/OT (they leave at 4:30), and no one from the nursing home picking up from administration - so we will get an obs stay. Dr. Rockne Menghini admitting.  MDM   Final diagnoses:  Hip pain, acute, left  Inability to ambulate due to hip    I personally performed the services described in this documentation, which was scribed in my presence. The recorded information has been reviewed and is accurate.  Pt comes in with hip pain, L side. Pain with ambulation. Exam is benign. Equivocal for sciatica. Suspicion is high for impingement syndrome.  Xrays neg.  Repeat exam was a lot better, but on ambulation - her pain got worse again. Ct hip ordered - if neg, we will dc.  Pt lives at an independent living center - son is calling them to do see if she can be moved upto assisted living. Pt has seen orthopedics, we will advise that the nursing home start pt/ot and for pt to see ortho again.   Varney Biles, MD 01/11/15  [place], MD 01/11/15 [place], MD 01/11/15 [place], MD 01/11/15 1719

## 2015-01-11 NOTE — ED Notes (Signed)
Attempted to call report, RN unavailable at this time. Preferred call back at [facility]

## 2015-01-12 DIAGNOSIS — Z8669 Personal history of other diseases of the nervous system and sense organs: Secondary | ICD-10-CM | POA: Diagnosis not present

## 2015-01-12 DIAGNOSIS — E038 Other specified hypothyroidism: Secondary | ICD-10-CM

## 2015-01-12 DIAGNOSIS — F411 Generalized anxiety disorder: Secondary | ICD-10-CM | POA: Diagnosis present

## 2015-01-12 DIAGNOSIS — M25552 Pain in left hip: Secondary | ICD-10-CM | POA: Diagnosis not present

## 2015-01-12 LAB — URINE CULTURE: SPECIAL REQUESTS: NORMAL

## 2015-01-12 MED ORDER — TRAMADOL HCL 50 MG PO TABS: 50.0000 mg | ORAL_TABLET | Freq: Four times a day (QID) | ORAL | Status: DC | PRN

## 2015-01-12 MED ORDER — CLONAZEPAM 0.5 MG PO TABS: 0.5000 mg | ORAL_TABLET | Freq: Two times a day (BID) | ORAL | Status: DC | PRN

## 2015-01-12 MED ORDER — ONDANSETRON HCL 4 MG PO TABS: 4.0000 mg | ORAL_TABLET | Freq: Four times a day (QID) | ORAL | Status: DC | PRN

## 2015-01-12 NOTE — Evaluation (Signed)
Physical Therapy Evaluation Patient Details Name: Rebecca [name] MRN: [id number] DOB: Jul 17, 1927 Today's Date: 01/12/2015   History of Present Illness  Who presents with a chief complaint of left sided hip pain that began suddenly when ambulating with a walker at a grocery store earlier today. The patient tells me she has had two falls in the last 6 weeks, once in the shower when she landed on her left side/buttocks and 3 weeks ago, when she fell out of bed and landed on her right side. She was unable to get up on her own, and "spent the night on the floor". She describes the pain as "sharp" and exacerbated by walking and bearing weight. In the ED, the patient had a CT of the hip that was unremarkable however she could not ambulate independently. She subsequently was referred for an observation admit  Clinical Impression  Patient is pleasantly confused, memory deficits present. Patient c/o pain  Intermittent of Posterior L thigh and about the sciatic notch which is exacerbated by weight through the leg. Patient's daughter present and confirms plans to move to ALF  And receive HHPT. Patient will benefit from acute  PT to address problems listed in note below to return to functional ambulation.  Follow Up Recommendations Home health PT;Supervision/Assistance - 24 hour    Equipment Recommendations  None recommended by PT    Recommendations for Other Services       Precautions / Restrictions Precautions Precautions: Fall      Mobility  Bed Mobility Overal bed mobility: Needs Assistance Bed Mobility: Supine to Sit     Supine to sit: Supervision        Transfers Overall transfer level: Needs assistance Equipment used: Rolling walker (2 wheeled) Transfers: Sit to/from Stand Sit to Stand: Min assist         General transfer comment: cues for safety,   Ambulation/Gait Ambulation/Gait assistance: Min assist Ambulation Distance (Feet): 25 Feet Assistive device: Rolling walker  (2 wheeled) Gait Pattern/deviations: Antalgic;Step-to pattern;Decreased stance time - left     General Gait Details: cues for weight through UE's to decrease pain onset of L leg. multimodal cues for safety  Stairs            Wheelchair Mobility    Modified Rankin (Stroke Patients Only)       Balance Overall balance assessment: History of Falls;Needs assistance Sitting-balance support: No upper extremity supported;Feet supported Sitting balance-Leahy Scale: Good     Standing balance support: During functional activity;Single extremity supported Standing balance-Leahy Scale: Fair Standing balance comment: stood to brush teeth at sink.                             Pertinent Vitals/Pain Pain Assessment: Faces Faces Pain Scale: Hurts whole lot Pain Location: appears to have  sharp pauin intermittent noted by patient grabbing  l leg and wincing. Pain Descriptors / Indicators: Spasm;Stabbing;Sharp;Shooting Pain Intervention(s): Limited activity within patient's tolerance;Monitored during session    [place] expects to be discharged to:: Assisted living                 Additional Comments: has been in independent, to transfer to ALF at [facility]    Prior Function Level of Independence: Independent               Hand Dominance        Extremity/Trunk Assessment   Upper Extremity Assessment: Overall WFL for tasks assessed  Lower Extremity Assessment: Overall WFL for tasks assessed;LLE deficits/detail   LLE Deficits / Details: AROM WFL decreased weight bearing through th L eg, approximation of Leg through foot exacerbates the pain.  Cervical / Trunk Assessment: Kyphotic  Communication   Communication: No difficulties  Cognition Arousal/Alertness: Awake/alert Behavior During Therapy: Restless;Impulsive Overall Cognitive Status: History of cognitive impairments - at baseline                       General Comments      Exercises        Assessment/Plan    PT Assessment Patient needs continued PT services  PT Diagnosis Difficulty walking;Acute pain   PT Problem List Decreased strength;Decreased range of motion;Decreased activity tolerance;Pain;Decreased knowledge of precautions;Decreased safety awareness;Decreased knowledge of use of DME;Decreased cognition;Decreased mobility  PT Treatment Interventions DME instruction;Gait training;Functional mobility training;Therapeutic activities;Patient/family education;Therapeutic exercise   PT Goals (Current goals can be found in the Care Plan section) Acute Rehab PT Goals Patient Stated Goal: to go to ALF PT Goal Formulation: With patient/family Time For Goal Achievement: 01/26/15 Potential to Achieve Goals: Good    Frequency Min 3X/week   Barriers to discharge        Co-evaluation               End of Session Equipment Utilized During Treatment: Gait belt Activity Tolerance: Patient tolerated treatment well Patient left: with call bell/phone within reach;with chair alarm set;with family/visitor present Nurse Communication: Mobility status    Functional Assessment Tool Used: clinical judgement Functional Limitation: Mobility: Walking and moving around Mobility: Walking and Moving Around Current Status [phone]): At least 20 percent but less than 40 percent impaired, limited or restricted Mobility: Walking and Moving Around Goal Status [phone]): 0 percent impaired, limited or restricted    Time: 0825-0856 PT Time Calculation (min) (ACUTE ONLY): 31 min   Charges:   PT Evaluation $Initial PT Evaluation Tier I: 1 Procedure PT Treatments $Gait Training: 8-22 mins   PT G Codes:   PT G-Codes **NOT FOR INPATIENT CLASS** Functional Assessment Tool Used: clinical judgement Functional Limitation: Mobility: Walking and moving around Mobility: Walking and Moving Around Current Status (A3557): At least 20 percent but less than  40 percent impaired, limited or restricted Mobility: Walking and Moving Around Goal Status [phone]): 0 percent impaired, limited or restricted    Claretha Cooper 01/12/2015, 9:07 AM Tresa Endo PT [phone]

## 2015-01-12 NOTE — Progress Notes (Signed)
Physical herapy note- Patient will benefit from  Skilled PT at SNF level for mobility and safety. Patient currently has pain and has  Had falls and is unable to safely return to Independent or [facility]level. Will update in PT evaluation.Tresa Endo PT [phone]

## 2015-01-12 NOTE — Progress Notes (Signed)
Report called to Dora Mudlogger at [facility] left at message. D Mateo Flow

## 2015-01-12 NOTE — Discharge Summary (Signed)
Physician Discharge Summary  DEMARI KROPP UUV:[id number] DOB: [birth year] DOA: 01/11/2015  PCP: Estill Dooms, MD  Admit date: 01/11/2015 Discharge date: 01/12/2015   Recommendations for Outpatient Follow-Up:   1. Recommend ongoing PT at SNF, consideration for outpatient orthopedic follow up if hip pain persists.   Discharge Diagnosis:   Principal Problem:    Left hip pain Active Problems:    Hypothyroidism    Inability to ambulate due to left hip    H/O idiopathic seizure    Generalized anxiety disorder   Discharge disposition:  SNF: [facility]  Discharge Condition: Stable.  Diet recommendation: Regular.   History of Present Illness:   Rebecca [name] is an 81 y.o. Female who presents with a chief complaint of left sided hip pain that began suddenly when ambulating with a walker at a grocery store on 01/12/15. The patient reported that she has had two falls in the last 6 weeks, once in the shower when she landed on her left side/buttocks and 3 weeks ago, and once when she fell out of bed and landed on her right side. She was unable to get up on her own, and "spent the night on the floor". She described the pain as "sharp" and exacerbated by walking and bearing weight. In the ED, the patient had a CT of the hip that was unremarkable however she could not ambulate independently. She subsequently was referred for an observation admit.  Hospital Course by Problem:   Principal Problem:  Left hip pain - Possibly from acute tendinitis or strained muscle. No abnormality seen on CT. No loss of strength. - Use Ultram and Tylenol as needed for pain control. - PT evaluation requested.  Active Problems:   Generalized anxiety disorder - Continue Klonopin.   Hypothyroidism - Continue Synthroid.   Inability to ambulate due to left hip - PT evaluation performed. Social work consult for discharge to assisted living or SNF.   H/O idiopathic seizure - Continue  Dilantin.    Medical Consultants:    None.   Discharge Exam:   Filed Vitals:   01/12/15 0540  BP: 99/56  Pulse: 63  Temp: 98.5 F (36.9 C)  Resp: 16   Filed Vitals:   01/11/15 1949 01/11/15 2115 01/12/15 0540 01/12/15 0604  BP: 127/72 128/65 99/56   Pulse: 66 68 63   Temp:  98.2 F (36.8 C) 98.5 F (36.9 C)   TempSrc:  Oral Oral   Resp: '18 14 16   '$ Height:    5' (1.524 m)  Weight:    59 kg (130 lb 1.1 oz)  SpO2: 94% 95% 94%     Gen:  NAD Cardiovascular:  RRR, No M/R/G Respiratory: Lungs CTAB Gastrointestinal: Abdomen soft, NT/ND with normal active bowel sounds. Extremities: No C/E/C   The results of significant diagnostics from this hospitalization (including imaging, microbiology, ancillary and laboratory) are listed below for reference.     Procedures and Diagnostic Studies:   Ct Hip Left Wo Contrast  01/11/2015   CLINICAL DATA:  Status post fall in the shower 2 weeks ago with a second fall 1 week ago. The patient reports she felt a pop/click in the left hip this morning when rolling over in bed. The patient is unable to walk. Initial encounter.  EXAM: CT OF THE LEFT HIP WITHOUT CONTRAST  TECHNIQUE: Multidetector CT imaging of the left hip was performed according to the standard protocol. Multiplanar CT image reconstructions were also generated.  COMPARISON:  Plain  films left hip this same day.  FINDINGS: No fracture, dislocation or other acute bony or joint abnormality is identified. Mild to moderate degenerative change is seen about the left hip and the symphysis pubis. No hip joint effusion is noted. Imaged musculature appears normal. Imaged intrapelvic contents demonstrate no acute abnormality with atherosclerosis noted.  IMPRESSION: No acute abnormality.  Mild to moderate degenerative disease about the left hip and symphysis pubis.   Electronically Signed   By: Inge Rise M.D.   On: 01/11/2015 15:36   Dg Hip Unilat With Pelvis 2-3 Views Left  01/11/2015    CLINICAL DATA:  Left hip pain after fall 6 weeks ago.  EXAM: DG HIP (WITH OR WITHOUT PELVIS) 2-3V LEFT  COMPARISON:  None.  FINDINGS: There is no evidence of hip fracture or dislocation. Mild narrowing of left hip joint is noted with minimal osteophyte formation.  IMPRESSION: Mild degenerative joint disease of left hip. No acute abnormality seen.   Electronically Signed   By: Marijo Conception, M.D.   On: 01/11/2015 14:09     Labs:   Basic Metabolic Panel:  Recent Labs Lab 01/11/15 1643 01/11/15 2115  NA 142  --   K 4.0  --   CL 105  --   GLUCOSE 122*  --   BUN 23*  --   CREATININE 0.60 0.54   GFR Estimated Creatinine Clearance: 41.3 mL/min (by C-G formula based on Cr of 0.54).  CBC:  Recent Labs Lab 01/11/15 1634 01/11/15 1643 01/11/15 2115  WBC 5.5  --  6.8  NEUTROABS 3.6  --   --   HGB 12.8 13.6 12.7  HCT 38.7 40.0 38.3  MCV 99.5  --  99.7  PLT 195  --  210   Microbiology Recent Results (from the past 240 hour(s))  Urine culture     Status: None (Preliminary result)   Collection Time: 01/11/15  2:18 PM  Result Value Ref Range Status   Specimen Description URINE, CLEAN CATCH  Final   Special Requests Normal  Final   Culture   Final    TOO YOUNG TO READ Performed at [facility]    Report Status PENDING  Incomplete     Discharge Instructions:       Discharge Instructions    Call MD for:  severe uncontrolled pain    Complete by:  As directed      Diet general    Complete by:  As directed      Increase activity slowly    Complete by:  As directed      Walk with assistance    Complete by:  As directed      Walker     Complete by:  As directed             Medication List    STOP taking these medications        clonazePAM 0.5 MG tablet  Commonly known as:  KLONOPIN     mirtazapine 15 MG disintegrating tablet  Commonly known as:  REMERON SOL-TAB      TAKE these medications        acetaminophen 650 MG CR tablet  Commonly known as:   TYLENOL  Take 650 mg by mouth daily as needed for pain.     amoxicillin 500 MG capsule  Commonly known as:  AMOXIL  Take 4 capsules one hour prior to dental appointment     CALCIUM PO  Take 1 tablet by  mouth daily.     KLS ALLER-TEC 10 MG tablet  Generic drug:  cetirizine  Take 10 mg by mouth daily. As needed for allergies     levothyroxine 100 MCG tablet  Commonly known as:  SYNTHROID, LEVOTHROID  Take 1 tablet (100 mcg total) by mouth daily before breakfast. For thyroid     meloxicam 15 MG tablet  Commonly known as:  MOBIC  TAKE 1 TABLET BY MOUTH ONCE DAILY TO HELP ARTHRITIS     mometasone 0.1 % cream  Commonly known as:  ELOCON  Apply to outer ear once a day as needed for itching     MULTIVITAMINS PO  Take 1 tablet by mouth daily. Take one tablet daily     omega-3 acid ethyl esters 1 G capsule  Commonly known as:  LOVAZA  Take 1 g by mouth daily. Fatty Acid one daily     ondansetron 4 MG tablet  Commonly known as:  ZOFRAN  Take 1 tablet (4 mg total) by mouth every 6 (six) hours as needed for nausea.     phenytoin 100 MG ER capsule  Commonly known as:  DILANTIN  Take 1 capsule by mouth in  the morning and 2 capsules  by mouth at bedtime to prevent seizure.     traMADol 50 MG tablet  Commonly known as:  ULTRAM  Take 50 mg by mouth every 6 (six) hours as needed for moderate pain or severe pain.     Vitamin D-3 5000 UNITS Tabs  Take 5,000 Units by mouth daily.       Follow-up Information    Follow up with GREEN, Viviann Spare, MD.   Specialty:  Internal Medicine   Contact information:   [phone] [place] [address] [phone]        Time coordinating discharge: 25 minutes.  Signed:  RAMA,CHRISTINA  Pager [phone] [facility] 01/12/2015, 11:09 AM

## 2015-01-12 NOTE — Progress Notes (Signed)
Utilization review completed.  

## 2015-01-12 NOTE — Progress Notes (Signed)
CSW met with pt / daughter this am to assist with d/c planning. PT recommended SNF placement at [facility] / daughter are in agreement with plan. Pt / daughter aware that medicare will not cover placement due to Observation status. Pt able to pay out of pocket for placement. MD d/c pt today. NSG reviewed d/c summary, scripts, avs. D/C summary sent to DON at SNF for review prior to d/c. Scripts included in d/c packet. [facility] transport required. Pt / daughter are aware out of pocket costs may be associated with [facility] transport.  Werner Lean LCSW [phone]

## 2015-01-15 DIAGNOSIS — I348 Other nonrheumatic mitral valve disorders: Secondary | ICD-10-CM | POA: Diagnosis not present

## 2015-01-15 DIAGNOSIS — M25552 Pain in left hip: Secondary | ICD-10-CM | POA: Diagnosis not present

## 2015-01-15 DIAGNOSIS — M6281 Muscle weakness (generalized): Secondary | ICD-10-CM | POA: Diagnosis not present

## 2015-01-15 DIAGNOSIS — M545 Low back pain: Secondary | ICD-10-CM | POA: Diagnosis not present

## 2015-01-15 DIAGNOSIS — E0789 Other specified disorders of thyroid: Secondary | ICD-10-CM | POA: Diagnosis not present

## 2015-01-15 DIAGNOSIS — M779 Enthesopathy, unspecified: Secondary | ICD-10-CM | POA: Diagnosis not present

## 2015-01-15 DIAGNOSIS — R42 Dizziness and giddiness: Secondary | ICD-10-CM | POA: Diagnosis not present

## 2015-01-15 DIAGNOSIS — K5793 Diverticulitis of intestine, part unspecified, without perforation or abscess with bleeding: Secondary | ICD-10-CM | POA: Diagnosis not present

## 2015-01-15 DIAGNOSIS — F419 Anxiety disorder, unspecified: Secondary | ICD-10-CM | POA: Diagnosis not present

## 2015-01-15 DIAGNOSIS — G4089 Other seizures: Secondary | ICD-10-CM | POA: Diagnosis not present

## 2015-01-15 DIAGNOSIS — M2550 Pain in unspecified joint: Secondary | ICD-10-CM | POA: Diagnosis not present

## 2015-01-15 DIAGNOSIS — M1993 Secondary osteoarthritis, unspecified site: Secondary | ICD-10-CM | POA: Diagnosis not present

## 2015-01-15 DIAGNOSIS — M818 Other osteoporosis without current pathological fracture: Secondary | ICD-10-CM | POA: Diagnosis not present

## 2015-01-15 DIAGNOSIS — F09 Unspecified mental disorder due to known physiological condition: Secondary | ICD-10-CM | POA: Diagnosis not present

## 2015-01-15 DIAGNOSIS — E559 Vitamin D deficiency, unspecified: Secondary | ICD-10-CM | POA: Diagnosis not present

## 2015-01-15 DIAGNOSIS — K649 Unspecified hemorrhoids: Secondary | ICD-10-CM | POA: Diagnosis not present

## 2015-01-15 DIAGNOSIS — E506 Vitamin A deficiency with xerophthalmic scars of cornea: Secondary | ICD-10-CM | POA: Diagnosis not present

## 2015-01-15 DIAGNOSIS — D649 Anemia, unspecified: Secondary | ICD-10-CM | POA: Diagnosis not present

## 2015-01-15 DIAGNOSIS — D72829 Elevated white blood cell count, unspecified: Secondary | ICD-10-CM | POA: Diagnosis not present

## 2015-01-15 DIAGNOSIS — R262 Difficulty in walking, not elsewhere classified: Secondary | ICD-10-CM | POA: Diagnosis not present

## 2015-01-15 DIAGNOSIS — E038 Other specified hypothyroidism: Secondary | ICD-10-CM | POA: Diagnosis not present

## 2015-01-15 DIAGNOSIS — M542 Cervicalgia: Secondary | ICD-10-CM | POA: Diagnosis not present

## 2015-01-18 DIAGNOSIS — E039 Hypothyroidism, unspecified: Secondary | ICD-10-CM | POA: Diagnosis not present

## 2015-01-18 LAB — TSH: TSH: 1.72 u[IU]/mL (ref 0.41–5.90)

## 2015-01-19 ENCOUNTER — Other Ambulatory Visit: Payer: Self-pay | Admitting: Nurse Practitioner

## 2015-01-19 DIAGNOSIS — E039 Hypothyroidism, unspecified: Secondary | ICD-10-CM

## 2015-01-24 ENCOUNTER — Telehealth: Payer: Self-pay

## 2015-01-24 NOTE — Telephone Encounter (Signed)
Environmental manager) at [facility] called to clarify if patient needs labs prior to next OV and if yes order needs to be faxed to [phone]  I reviewed last 2 OV notes and do not see any labs to be done prior to appointment (01/30/15). Please advise

## 2015-01-24 NOTE — Telephone Encounter (Signed)
09/26/14 note TSH in future. She is to have TSH 01/22/15. icd E03.9.

## 2015-01-24 NOTE — Telephone Encounter (Signed)
Needs TSH prior to her next appointment

## 2015-01-24 NOTE — Telephone Encounter (Signed)
TSH was checked 6 days ago, see abstraction under labs.  I discussed this verbally with Dr.Green and he stated no additional labs are needed at this time.  I called and discussed with Nurse at [facility]

## 2015-01-30 ENCOUNTER — Encounter: Payer: Self-pay | Admitting: Internal Medicine

## 2015-01-30 ENCOUNTER — Non-Acute Institutional Stay: Payer: Medicare Other | Admitting: Internal Medicine

## 2015-01-30 VITALS — BP 110/62 | HR 60 | Temp 97.8°F | Wt 123.0 lb

## 2015-01-30 DIAGNOSIS — E039 Hypothyroidism, unspecified: Secondary | ICD-10-CM | POA: Diagnosis not present

## 2015-01-30 DIAGNOSIS — Z66 Do not resuscitate: Secondary | ICD-10-CM

## 2015-01-30 DIAGNOSIS — R413 Other amnesia: Secondary | ICD-10-CM | POA: Diagnosis not present

## 2015-01-30 DIAGNOSIS — F329 Major depressive disorder, single episode, unspecified: Secondary | ICD-10-CM | POA: Diagnosis not present

## 2015-01-30 DIAGNOSIS — R262 Difficulty in walking, not elsewhere classified: Secondary | ICD-10-CM

## 2015-01-30 DIAGNOSIS — F32A Depression, unspecified: Secondary | ICD-10-CM

## 2015-01-30 DIAGNOSIS — R739 Hyperglycemia, unspecified: Secondary | ICD-10-CM

## 2015-01-30 DIAGNOSIS — Z8669 Personal history of other diseases of the nervous system and sense organs: Secondary | ICD-10-CM | POA: Diagnosis not present

## 2015-01-30 DIAGNOSIS — Z87898 Personal history of other specified conditions: Secondary | ICD-10-CM

## 2015-01-30 MED ORDER — SERTRALINE HCL 50 MG PO TABS: ORAL_TABLET | ORAL | Status: DC

## 2015-01-30 MED ORDER — DONEPEZIL HCL 5 MG PO TABS: ORAL_TABLET | ORAL | Status: DC

## 2015-01-30 NOTE — Progress Notes (Signed)
Patient ID: Rebecca [name], female   DOB: January 03, 1928, 81 y.o.   MRN: [id number]    [place] Room Number: 61  Place of Service: Clinic (12)     Allergies  Allergen Reactions  . Morphine And Related Other (See Comments)    Altered mental state   . Sulfa Antibiotics Other (See Comments)    Reaction: unknown    Chief Complaint  Patient presents with  . Medical Management of Chronic Issues    memory, thyroid    HPI:  Admitted to the hospital 01/11/2015 through 01/12/2015. She had left hip pain which caused an inability to walk. She had had 2 falls in 6 weeks previous to this hospitalization. Imaging studies included left hip with pelvis and CT of the left hip without contrast. Degenerative changes were noted but there was no acute injury.  Moved to assisted living at [facility] following hospitalization. Patient does not want stay here. She says her family is forcing her to do this. She is to meet with them this afternoon to further discuss her plans to move back to independent living  H/O idiopathic seizure - continues on phenytoin. Last seizure 2010.  Hyperglycemia - glucose to 122 mg percent while hospitalized  Hypothyroidism, unspecified hypothyroidism type - recent TSH normal  Memory loss - patient is aware that her memory is not as good as it used to be. She still says that it is doing well enough for her to live independently.  Depression - morose. Feeling useless. Says she cannot stand assisted living. Still grieving over the fact that I told her she should not drive anymore. She is not accepting the limitations of her memory changes or physical frailty with hx of falls.    Medications: Patient's Medications  New Prescriptions   No medications on file  Previous Medications   ACETAMINOPHEN (TYLENOL) 650 MG CR TABLET    Take 650 mg by mouth daily as needed for pain.   AMOXICILLIN (AMOXIL) 500 MG CAPSULE    Take 4 capsules one hour  prior to dental appointment   CALCIUM PO    Take 1 tablet by mouth daily.   CETIRIZINE (KLS ALLER-TEC) 10 MG TABLET    Take 10 mg by mouth daily. As needed for allergies   CHOLECALCIFEROL (VITAMIN D-3) 5000 UNITS TABS    Take 5,000 Units by mouth daily.    CLONAZEPAM (KLONOPIN) 0.5 MG TABLET    Take 1 tablet (0.5 mg total) by mouth 2 (two) times daily as needed for anxiety.   LEVOTHYROXINE (SYNTHROID, LEVOTHROID) 100 MCG TABLET    Take 1 tablet (100 mcg total) by mouth daily before breakfast. For thyroid   MELOXICAM (MOBIC) 15 MG TABLET    TAKE 1 TABLET BY MOUTH ONCE DAILY TO HELP ARTHRITIS   MOMETASONE (ELOCON) 0.1 % CREAM    Apply to outer ear once a day as needed for itching   MULTIPLE VITAMIN (MULTIVITAMINS PO)    Take 1 tablet by mouth daily. Take one tablet daily   OMEGA-3 ACID ETHYL ESTERS (LOVAZA) 1 G CAPSULE    Take 1 g by mouth daily. Fatty Acid one daily   ONDANSETRON (ZOFRAN) 4 MG TABLET    Take 1 tablet (4 mg total) by mouth every 6 (six) hours as needed for nausea.   PHENYTOIN (DILANTIN) 100 MG ER CAPSULE    Take 1 capsule by mouth in  the morning and 2 capsules  by mouth at bedtime  to prevent seizure.   TRAMADOL (ULTRAM) 50 MG TABLET    Take 1 tablet (50 mg total) by mouth every 6 (six) hours as needed for moderate pain or severe pain.  Modified Medications   No medications on file  Discontinued Medications   No medications on file     Review of Systems  Constitutional: Negative.   HENT:       Bilateral hearing loss. June 2015 ruptured right Tm with drainage, but little pain.  Eyes: Negative.   Respiratory:       History of dyspnea on exertion  Cardiovascular: Negative.   Gastrointestinal:       Blood noted in stool and toilet paper occasionally.  Endocrine:       History of hypothyroidism  Genitourinary: Negative.   Musculoskeletal:       Osteoarthritis with complaints of pain in multiple areas. Neck and right shoulder pains. Sharp pain in the right shoulder and  arm when she abducts and rotated at the shoulder. Acutely tender in the left SI joint.  Skin:       Senile ecchymoses  Allergic/Immunologic: Negative.   Neurological: Positive for tremors.       Hx seizures.   Hematological: Negative.   Psychiatric/Behavioral: Positive for suicidal ideas, confusion and dysphoric mood.       Chronic anxiety. Memory deficits.    Filed Vitals:   01/30/15 0948  BP: 110/62  Pulse: 60  Temp: 97.8 F (36.6 C)  TempSrc: Oral  Weight: 123 lb (55.792 kg)  SpO2: 91%   Body mass index is 24.02 kg/(m^2).  Physical Exam  Constitutional: She is oriented to person, place, and time. She appears well-developed and well-nourished. No distress.  HENT:  Bilateral hearing loss. Rupture of the right TM.   Eyes:  Lens implants in both eyes  Neck: Normal range of motion. Neck supple. No JVD present. No tracheal deviation present. No thyromegaly present.  Cardiovascular: Normal rate, regular rhythm, normal heart sounds and intact distal pulses.  Exam reveals no gallop and no friction rub.   No murmur heard. Pulmonary/Chest: Effort normal and breath sounds normal. No respiratory distress. She has no wheezes. She exhibits no tenderness.  Abdominal: Soft. Bowel sounds are normal. She exhibits no distension and no mass. There is no tenderness.  Genitourinary: Guaiac negative stool.  Hemorrhoids. Normal sphincter tone. Stools are normal brown color.  Musculoskeletal: Normal range of motion. She exhibits no edema or tenderness.  Tender in the posterior neck. Mild restriction in movements. Hammertoes at the left second and third. Right second toe overlaps the great toe. SP surgical repair of the right 2nd toe.  Shoulders sore. Painful in the right shoulder when she lifts overhead and rotates at the shpoulder.[facility] July 2014. Unstable gait. Using walker with 2 front wheels and rear skids. Weaker grip of the right hand.. Tender in the left SI joint.  Lymphadenopathy:    She  has no cervical adenopathy.  Neurological: She is alert and oriented to person, place, and time. No cranial nerve deficit. Coordination normal.  No loss of grip strength. Loss of memory. 04/04/14 MMSE 25/30. Passed clock drawing. 07/04/14 MMSE 28/30. Failed clock drawing. Bilateral tremor.  Skin: Skin is warm and dry. No rash noted. No erythema. No pallor.  Psychiatric: She has a normal mood and affect. Her behavior is normal. Thought content normal.     Labs reviewed: Lab Summary Latest Ref Rng 01/11/2015 01/11/2015 01/11/2015 09/18/2014 06/29/2014 03/27/2014  Hemoglobin 12.0 - 15.0 g/dL  12.7 13.6 12.8 (None) (None) 12.6  Hematocrit 36.0 - 46.0 % 38.3 40.0 38.7 (None) (None) 39  White count 4.0 - 10.5 K/uL 6.8 (None) 5.5 (None) (None) 6.8  Platelet count 150 - 400 K/uL 210 (None) 195 (None) (None) 197  Sodium 135 - 145 mmol/L (None) 142 (None) 143 141 141  Potassium 3.5 - 5.1 mmol/L (None) 4.0 (None) 4.1 4.1 4.4  Calcium - (None) (None) (None) (None) (None) (None)  Phosphorus - (None) (None) (None) (None) (None) (None)  Creatinine 0.44 - 1.00 mg/dL 0.54 0.60 (None) 0.7 0.6 0.6  AST 13 - 35 U/L (None) (None) (None) (None) 19 19  Alk Phos 25 - 125 U/L (None) (None) (None) (None) 80 77  Bilirubin - (None) (None) (None) (None) (None) (None)  Glucose 65 - 99 mg/dL (None) 122(H) (None) 99 88 84  Cholesterol - (None) (None) (None) (None) (None) (None)  HDL cholesterol - (None) (None) (None) (None) (None) (None)  Triglycerides - (None) (None) (None) (None) (None) (None)  LDL Direct - (None) (None) (None) (None) (None) (None)  LDL Calc - (None) (None) (None) (None) (None) (None)  Total protein - (None) (None) (None) (None) (None) (None)  Albumin - (None) (None) (None) (None) (None) (None)   Lab Results  Component Value Date   TSH 1.72 01/18/2015   Lab Results  Component Value Date   BUN 23* 01/11/2015   Lab Results  Component Value Date   HGBA1C 5.5 11/21/2013    Ct Hip Left Wo  Contrast  01/11/2015   CLINICAL DATA:  Status post fall in the shower 2 weeks ago with a second fall 1 week ago. The patient reports she felt a pop/click in the left hip this morning when rolling over in bed. The patient is unable to walk. Initial encounter.  EXAM: CT OF THE LEFT HIP WITHOUT CONTRAST  TECHNIQUE: Multidetector CT imaging of the left hip was performed according to the standard protocol. Multiplanar CT image reconstructions were also generated.  COMPARISON:  Plain films left hip this same day.  FINDINGS: No fracture, dislocation or other acute bony or joint abnormality is identified. Mild to moderate degenerative change is seen about the left hip and the symphysis pubis. No hip joint effusion is noted. Imaged musculature appears normal. Imaged intrapelvic contents demonstrate no acute abnormality with atherosclerosis noted.  IMPRESSION: No acute abnormality.  Mild to moderate degenerative disease about the left hip and symphysis pubis.   Electronically Signed   By: Inge Rise M.D.   On: 01/11/2015 15:36   Dg Hip Unilat With Pelvis 2-3 Views Left  01/11/2015   CLINICAL DATA:  Left hip pain after fall 6 weeks ago.  EXAM: DG HIP (WITH OR WITHOUT PELVIS) 2-3V LEFT  COMPARISON:  None.  FINDINGS: There is no evidence of hip fracture or dislocation. Mild narrowing of left hip joint is noted with minimal osteophyte formation.  IMPRESSION: Mild degenerative joint disease of left hip. No acute abnormality seen.   Electronically Signed   By: Marijo Conception, M.D.   On: 01/11/2015 14:09     Assessment/Plan  1. Inability to ambulate due to left hip Resolved  2. H/O idiopathic seizure Continue current phenytoin  3. Hyperglycemia Follow-up  4. Hypothyroidism, unspecified hypothyroidism type Compensated  5. Memory loss - donepezil (ARICEPT) 5 MG tablet; One nightly to help preserve memory  Dispense: 30 tablet; Refill: 5  6. Depression - sertraline (ZOLOFT) 50 MG tablet; One daily to  improve anxiety and depression  Dispense:  30 tablet; Refill: 5  7. Advance directive indicates patient wish for do-not-resuscitate status - DNR (Do Not Resuscitate)  Advised patient during the visit that I thought for her own safety, it is best that she stay in the assisted living area. She will discuss this with her family. She seems determined to move back to her independent living area. If her family agrees to allow her to do this, I will support that decision.Marland Kitchen

## 2015-02-01 DIAGNOSIS — Z5181 Encounter for therapeutic drug level monitoring: Secondary | ICD-10-CM | POA: Diagnosis not present

## 2015-02-12 DIAGNOSIS — M25569 Pain in unspecified knee: Secondary | ICD-10-CM | POA: Diagnosis not present

## 2015-02-12 DIAGNOSIS — M25559 Pain in unspecified hip: Secondary | ICD-10-CM | POA: Diagnosis not present

## 2015-02-16 DIAGNOSIS — M533 Sacrococcygeal disorders, not elsewhere classified: Secondary | ICD-10-CM | POA: Diagnosis not present

## 2015-02-22 ENCOUNTER — Other Ambulatory Visit: Payer: Self-pay | Admitting: Internal Medicine

## 2015-03-06 ENCOUNTER — Encounter: Payer: Self-pay | Admitting: Nurse Practitioner

## 2015-03-06 ENCOUNTER — Non-Acute Institutional Stay: Payer: Medicare Other | Admitting: Nurse Practitioner

## 2015-03-06 DIAGNOSIS — F329 Major depressive disorder, single episode, unspecified: Secondary | ICD-10-CM | POA: Diagnosis not present

## 2015-03-06 DIAGNOSIS — E039 Hypothyroidism, unspecified: Secondary | ICD-10-CM | POA: Diagnosis not present

## 2015-03-06 DIAGNOSIS — F32A Depression, unspecified: Secondary | ICD-10-CM

## 2015-03-06 DIAGNOSIS — N390 Urinary tract infection, site not specified: Secondary | ICD-10-CM | POA: Diagnosis not present

## 2015-03-06 DIAGNOSIS — Z8669 Personal history of other diseases of the nervous system and sense organs: Secondary | ICD-10-CM | POA: Diagnosis not present

## 2015-03-06 DIAGNOSIS — R413 Other amnesia: Secondary | ICD-10-CM | POA: Diagnosis not present

## 2015-03-06 DIAGNOSIS — R42 Dizziness and giddiness: Secondary | ICD-10-CM | POA: Diagnosis not present

## 2015-03-06 DIAGNOSIS — G4700 Insomnia, unspecified: Secondary | ICD-10-CM | POA: Diagnosis not present

## 2015-03-06 DIAGNOSIS — Z87898 Personal history of other specified conditions: Secondary | ICD-10-CM

## 2015-03-06 DIAGNOSIS — M5440 Lumbago with sciatica, unspecified side: Secondary | ICD-10-CM | POA: Diagnosis not present

## 2015-03-06 NOTE — Assessment & Plan Note (Signed)
Multiple sites, continue Meloxicam '15mg'$  daily.

## 2015-03-06 NOTE — Assessment & Plan Note (Signed)
Sleeps well, desires to change Tylenol PM to prn, discourage Tylenol PM use may help c/o dizziness.

## 2015-03-06 NOTE — Assessment & Plan Note (Signed)
Memory lapses, recent started on Aricept, ? Contributory dizziness, will try change Tylenol PM to prn per patient's request and decrease Sertraline to '25mg'$  in setting of lip smacking and fine finger tremor. She is adjusting to AL and stated she has less anger.

## 2015-03-06 NOTE — Assessment & Plan Note (Addendum)
Not new, recent c/o last Thr and today, no focal weakness, better after sitting down, will decrease Sertraline from '50mg'$  to '25mg'$ , change recently started Tylenol PM nightly to prn, continue Zyrtec prn instead of routine, may consider hold recently 03/01/15 increased Aricept to '10mg'$  if no better of dizziness, update CBC, CmP, TSH, Hgb a1c, UA C/S

## 2015-03-06 NOTE — Assessment & Plan Note (Addendum)
Continue Levothyroxine 148mg 01/18/15 TSH 1.723

## 2015-03-06 NOTE — Assessment & Plan Note (Signed)
Last seizure 2010 02/01/15 Dilantin lever 12.4(10-20) Continue Dilantin '100mg'$  daily.

## 2015-03-06 NOTE — Assessment & Plan Note (Signed)
Sleeps well, adjusting to AL, less anger, will decrease Sertraline from '50mg'$  to '25mg'$  daily in setting of tremor, lip smacking, c/o fatigue and dizziness.

## 2015-03-06 NOTE — Progress Notes (Signed)
Patient ID: Rebecca [name], female   DOB: [birth year], 81 y.o.   MRN: [id number]  Location:  [facility] Provider:  Marlana Latus NP  Code Status:  DNR Goals of care: Advanced Directive information    Chief Complaint  Patient presents with  . Medical Management of Chronic Issues  . Acute Visit    tremor, dizziness, memory.      HPI: Patient is a 81 y.o. female seen in the AL at [facility] today for evaluation of  C/o dizziness last Thr during therapy, resolved after slept 5-6 hours, denied chest pain, palpitation,  focal weakness, vision changes, or altered mentation. C/o fussy head today, she attributed this to her seasonal allergy, no watery or itchy eyes, nasal drainage, coughing, prn Zyrtec available to her which has not been used. Otherwise she stated she sleeps well, has good appetite, urinates 2-3 x/night, denied dysuria.   Review of Systems:  Review of Systems  Constitutional: Positive for malaise/fatigue. Negative for diaphoresis.  HENT: Positive for hearing loss. Negative for congestion, ear discharge, ear pain, nosebleeds, sore throat and tinnitus.        Bilateral hearing loss. June 2015 ruptured right Tm with drainage, but little pain.  Eyes: Negative.   Respiratory:       History of dyspnea on exertion  Cardiovascular: Negative.   Gastrointestinal:       Blood noted in stool and toilet paper occasionally.  Genitourinary: Positive for frequency.       2-3x/night  Musculoskeletal:       Osteoarthritis with complaints of pain in multiple areas. Neck and right shoulder pains. Sharp pain in the right shoulder and arm when she abducts and rotated at the shoulder  Skin:       Senile ecchymoses  Neurological: Positive for dizziness and tremors. Negative for tingling, sensory change, speech change and focal weakness.       Hx seizures.   Endo/Heme/Allergies: Negative for environmental allergies and polydipsia.  Psychiatric/Behavioral: Positive for suicidal ideas. The patient  has insomnia.        Chronic anxiety. Memory deficits.    Past Medical History  Diagnosis Date  . Osteoporosis 02/2011  . Seizures ([place])   . DJD (degenerative joint disease)   . Thyroid disease   . Diverticulosis 02/2011  . Hemorrhoids 02/2011  . Dizziness   . Low back pain   . Mitral valve problem thickened    thickened  . Unspecified hypothyroidism 01/2011  . 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 Vitamin D deficiency 01/2011  . Anemia, unspecified 03/18/2011  . Anxiety state, unspecified 01/2011  . 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 Pain in joint, site unspecified 01/2012  . Sacroiliitis, not elsewhere classified ([place]) 05/2011  . Dysuria 06/17/2011  . Other abnormal blood chemistry 0/30/2012  . Blood in stool 06/15/2012  . Other acquired deformity of toe 12/16/2011  . Corns and callosities 07/01/2011    Patient Active Problem List   Diagnosis Date Noted  . Insomnia 03/06/2015  . Generalized anxiety disorder 01/12/2015  . Left hip pain 01/11/2015  . H/O idiopathic seizure 01/11/2015  . Unstable gait 12/19/2014  . Sacroiliac joint pain 12/19/2014  . Depression 09/26/2014  . Ruptured tympanic membrane 10/31/2013  . Carpal tunnel syndrome 09/30/2013  . Pain in joint, lower leg 03/22/2013  . Cervicalgia 01/11/2013  . Personal history of fall 11/30/2012  . Biceps tendon rupture 11/30/2012  . Anemia, unspecified 11/30/2012  .  Hyperglycemia 09/19/2012  . Memory loss 09/19/2012  . Hearing decreased 09/19/2012  . Other acquired deformity of toe 12/16/2011  . Anxiety state 01/18/2011  . SHOULDER PAIN, LEFT 09/03/2009  . CLOS FX C7 VERTEBRA W/O MENTION SP CRD INJURY 09/03/2009  . CAROTID BRUIT 02/10/2008  . Osteoarthritis 05/05/2007  . TREMOR, ESSENTIAL 02/03/2007  . LOW BACK PAIN 02/03/2007  . Senile osteoporosis 01/11/2007  . COLONIC POLYPS 12/08/2006  . Hypothyroidism 12/08/2006  . HEMORRHOIDS, INTERNAL W/O  COMPLICATION 12/87/8676  . DIVERTICULOSIS, COLON 12/08/2006  . DIZZINESS OR VERTIGO 12/08/2006    Allergies  Allergen Reactions  . Morphine And Related Other (See Comments)    Altered mental state   . Sulfa Antibiotics Other (See Comments)    Reaction: unknown    Medications: Patient's Medications  New Prescriptions   No medications on file  Previous Medications   ACETAMINOPHEN (TYLENOL) 650 MG CR TABLET    Take 650 mg by mouth daily as needed for pain.   AMOXICILLIN (AMOXIL) 500 MG CAPSULE    Take 4 capsules one hour prior to dental appointment   CALCIUM PO    Take 1 tablet by mouth daily.   CETIRIZINE (KLS ALLER-TEC) 10 MG TABLET    Take 10 mg by mouth daily. As needed for allergies   CHOLECALCIFEROL (VITAMIN D-3) 5000 UNITS TABS    Take 5,000 Units by mouth daily.    CLONAZEPAM (KLONOPIN) 0.5 MG TABLET    Take 1 tablet (0.5 mg total) by mouth 2 (two) times daily as needed for anxiety.   DONEPEZIL (ARICEPT) 5 MG TABLET    TAKE 1 TAB BY MOUTH EVERY MORNING FOR 28 DAYS (4 WEEKS) THEN INCREASE TO '10MG'$    LEVOTHYROXINE (SYNTHROID, LEVOTHROID) 100 MCG TABLET    Take 1 tablet (100 mcg total) by mouth daily before breakfast. For thyroid   MELOXICAM (MOBIC) 15 MG TABLET    TAKE 1 TABLET BY MOUTH ONCE DAILY TO HELP ARTHRITIS   MOMETASONE (ELOCON) 0.1 % CREAM    Apply to outer ear once a day as needed for itching   MULTIPLE VITAMIN (MULTIVITAMINS PO)    Take 1 tablet by mouth daily. Take one tablet daily   OMEGA-3 ACID ETHYL ESTERS (LOVAZA) 1 G CAPSULE    Take 1 g by mouth daily. Fatty Acid one daily   ONDANSETRON (ZOFRAN) 4 MG TABLET    Take 1 tablet (4 mg total) by mouth every 6 (six) hours as needed for nausea.   PHENYTOIN (DILANTIN) 100 MG ER CAPSULE    Take 1 capsule by mouth in  the morning and 2 capsules  by mouth at bedtime to prevent seizure.   SERTRALINE (ZOLOFT) 50 MG TABLET    One daily to improve anxiety and depression   TRAMADOL (ULTRAM) 50 MG TABLET    Take 1 tablet (50 mg  total) by mouth every 6 (six) hours as needed for moderate pain or severe pain.  Modified Medications   No medications on file  Discontinued Medications   No medications on file    Physical Exam: Filed Vitals:   03/06/15 1222  BP: 120/68  Pulse: 73  Temp: 98.8 F (37.1 C)  TempSrc: Tympanic  Resp: 21   There is no weight on file to calculate BMI.  Physical Exam  Constitutional: She is oriented to person, place, and time. She appears well-developed and well-nourished. No distress.  HENT:  Bilateral hearing loss. Rupture of the right TM.   Eyes: Conjunctivae and EOM are  normal. Pupils are equal, round, and reactive to light.  Lens implants in both eyes  Neck: Normal range of motion. Neck supple. No JVD present. No tracheal deviation present. No thyromegaly present.  Cardiovascular: Normal rate, regular rhythm, normal heart sounds and intact distal pulses.  Exam reveals no gallop and no friction rub.   No murmur heard. Pulmonary/Chest: Effort normal and breath sounds normal. No respiratory distress. She has no wheezes. She exhibits no tenderness.  Abdominal: Soft. Bowel sounds are normal. She exhibits no distension and no mass. There is no tenderness.  Genitourinary: Guaiac negative stool.  From previous exam: Hemorrhoids. Normal sphincter tone. Stools are normal brown color.  Musculoskeletal: Normal range of motion. She exhibits no edema or tenderness.  Tender in the posterior neck. Mild restriction in movements. Hammertoes at the left second and third. Right second toe overlaps the great toe. SP surgical repair of the right 2nd toe.  Shoulders sore. Painful in the right shoulder when she lifts overhead and rotates at the shpoulder.[facility] July 2014. Unstable gait. Using walker with 2 front wheels and rear skids. Weaker grip of the right hand.. Tender in the left SI joint.  Lymphadenopathy:    She has no cervical adenopathy.  Neurological: She is alert and oriented to person,  place, and time. No cranial nerve deficit. Coordination normal.  No loss of grip strength. Loss of memory. 04/04/14 MMSE 25/30. Passed clock drawing. 07/04/14 MMSE 28/30. Failed clock drawing. Bilateral tremor.  Skin: Skin is warm and dry. No rash noted. No erythema. No pallor.  Psychiatric: She has a normal mood and affect. Her behavior is normal. Thought content normal.    Labs reviewed: Basic Metabolic Panel:  Recent Labs  06/29/14 09/18/14 01/11/15 1643 01/11/15 2115  NA 141 143 142  --   K 4.1 4.1 4.0  --   CL  --   --  105  --   GLUCOSE  --   --  122*  --   BUN 25* 24* 23*  --   CREATININE 0.6 0.7 0.60 0.54    Liver Function Tests:  Recent Labs  03/27/14 06/29/14  AST 19 19  ALT 10 10  ALKPHOS 77 80    CBC:  Recent Labs  03/27/14 01/11/15 1634 01/11/15 1643 01/11/15 2115  WBC 6.8 5.5  --  6.8  NEUTROABS  --  3.6  --   --   HGB 12.6 12.8 13.6 12.7  HCT 39 38.7 40.0 38.3  MCV  --  99.5  --  99.7  PLT 197 195  --  210    Lab Results  Component Value Date   TSH 1.72 01/18/2015   Lab Results  Component Value Date   HGBA1C 5.5 11/21/2013   Lab Results  Component Value Date   CHOL 235* 03/06/2009   HDL 108.40 03/06/2009   LDLDIRECT 108.2 03/06/2009    Significant Diagnostic Results since last visit: none  Patient Care Team: Estill Dooms, MD as PCP - General (Internal Medicine) [facility] [name], MD as Consulting Physician (Orthopedic Surgery) [name], MD as Consulting Physician (Physical Medicine and Rehabilitation) Melina Schools, MD as Consulting Physician (Orthopedic Surgery) [name], MD as Consulting Physician (Otolaryngology)  Assessment/Plan Problem List Items Addressed This Visit    Hypothyroidism (Chronic)    Continue Levothyroxine 172mg 01/18/15 TSH 1.723      LOW BACK PAIN (Chronic)    Multiple sites, continue Meloxicam '15mg'$  daily.       Memory  loss - Primary (Chronic)    Memory lapses, recent started  on Aricept, ? Contributory dizziness, will try change Tylenol PM to prn per patient's request and decrease Sertraline to '25mg'$  in setting of lip smacking and fine finger tremor. She is adjusting to AL and stated she has less anger.       DIZZINESS OR VERTIGO    Not new, recent c/o last Thr and today, no focal weakness, better after sitting down, will decrease Sertraline from '50mg'$  to '25mg'$ , change recently started Tylenol PM nightly to prn, continue Zyrtec prn instead of routine, may consider hold recently 03/01/15 increased Aricept to '10mg'$  if no better of dizziness, update CBC, CmP, TSH, Hgb a1c, UA C/S      Depression    Sleeps well, adjusting to AL, less anger, will decrease Sertraline from '50mg'$  to '25mg'$  daily in setting of tremor, lip smacking, c/o fatigue and dizziness.       H/O idiopathic seizure    Last seizure 2010 02/01/15 Dilantin lever 12.4(10-20) Continue Dilantin '100mg'$  daily.       Insomnia    Sleeps well, desires to change Tylenol PM to prn, discourage Tylenol PM use may help c/o dizziness.           Family/ staff Communication: observe for dizziness.   Labs/tests ordered:  CBC, CMP, TSH, Hgb A1c, UA C/S  [facility] Raeven Pint NP [facility] [address], [place] [address] On Call:  [phone] & follow prompts after 5pm & weekends Office Phone:  [phone] Office Fax:  [phone]

## 2015-03-08 DIAGNOSIS — R6889 Other general symptoms and signs: Secondary | ICD-10-CM | POA: Diagnosis not present

## 2015-03-08 DIAGNOSIS — R7309 Other abnormal glucose: Secondary | ICD-10-CM | POA: Diagnosis not present

## 2015-03-08 DIAGNOSIS — I10 Essential (primary) hypertension: Secondary | ICD-10-CM | POA: Diagnosis not present

## 2015-03-08 LAB — BASIC METABOLIC PANEL
BUN: 21 mg/dL (ref 4–21)
CREATININE: 0.6 mg/dL (ref 0.5–1.1)
Glucose: 76 mg/dL
POTASSIUM: 4.5 mmol/L (ref 3.4–5.3)
Sodium: 142 mmol/L (ref 137–147)

## 2015-03-08 LAB — HEPATIC FUNCTION PANEL
ALK PHOS: 74 U/L (ref 25–125)
ALT: 13 U/L (ref 7–35)
AST: 20 U/L (ref 13–35)
Bilirubin, Total: 0.4 mg/dL

## 2015-03-08 LAB — TSH: TSH: 1.47 u[IU]/mL (ref 0.41–5.90)

## 2015-03-08 LAB — CBC AND DIFFERENTIAL
HCT: 40 % (ref 36–46)
Hemoglobin: 13.5 g/dL (ref 12.0–16.0)
Platelets: 200 10*3/uL (ref 150–399)
WBC: 6.2 10^3/mL

## 2015-03-08 LAB — HEMOGLOBIN A1C: Hgb A1c MFr Bld: 5.4 % (ref 4.0–6.0)

## 2015-03-09 ENCOUNTER — Other Ambulatory Visit: Payer: Self-pay | Admitting: Nurse Practitioner

## 2015-03-09 ENCOUNTER — Encounter: Payer: Self-pay | Admitting: Nurse Practitioner

## 2015-03-09 DIAGNOSIS — N390 Urinary tract infection, site not specified: Secondary | ICD-10-CM | POA: Insufficient documentation

## 2015-03-11 ENCOUNTER — Inpatient Hospital Stay (HOSPITAL_COMMUNITY)
Admission: EM | Admit: 2015-03-11 | Discharge: 2015-03-19 | DRG: 871 | Disposition: A | Discharge status: Skilled Nursing Facility | Payer: Medicare Other | Attending: Internal Medicine | Admitting: Internal Medicine

## 2015-03-11 ENCOUNTER — Emergency Department (HOSPITAL_COMMUNITY): Payer: Medicare Other

## 2015-03-11 ENCOUNTER — Encounter (HOSPITAL_COMMUNITY): Payer: Self-pay | Admitting: Emergency Medicine

## 2015-03-11 DIAGNOSIS — R4182 Altered mental status, unspecified: Secondary | ICD-10-CM

## 2015-03-11 DIAGNOSIS — M1990 Unspecified osteoarthritis, unspecified site: Secondary | ICD-10-CM | POA: Diagnosis present

## 2015-03-11 DIAGNOSIS — Z79899 Other long term (current) drug therapy: Secondary | ICD-10-CM | POA: Diagnosis not present

## 2015-03-11 DIAGNOSIS — Z885 Allergy status to narcotic agent status: Secondary | ICD-10-CM

## 2015-03-11 DIAGNOSIS — J9601 Acute respiratory failure with hypoxia: Secondary | ICD-10-CM | POA: Diagnosis present

## 2015-03-11 DIAGNOSIS — Z9842 Cataract extraction status, left eye: Secondary | ICD-10-CM

## 2015-03-11 DIAGNOSIS — Z66 Do not resuscitate: Secondary | ICD-10-CM | POA: Diagnosis present

## 2015-03-11 DIAGNOSIS — Z96653 Presence of artificial knee joint, bilateral: Secondary | ICD-10-CM | POA: Diagnosis present

## 2015-03-11 DIAGNOSIS — D72829 Elevated white blood cell count, unspecified: Secondary | ICD-10-CM | POA: Diagnosis not present

## 2015-03-11 DIAGNOSIS — E039 Hypothyroidism, unspecified: Secondary | ICD-10-CM | POA: Diagnosis present

## 2015-03-11 DIAGNOSIS — Y95 Nosocomial condition: Secondary | ICD-10-CM | POA: Diagnosis present

## 2015-03-11 DIAGNOSIS — J189 Pneumonia, unspecified organism: Secondary | ICD-10-CM

## 2015-03-11 DIAGNOSIS — M810 Age-related osteoporosis without current pathological fracture: Secondary | ICD-10-CM | POA: Diagnosis present

## 2015-03-11 DIAGNOSIS — Z9889 Other specified postprocedural states: Secondary | ICD-10-CM | POA: Diagnosis not present

## 2015-03-11 DIAGNOSIS — Z825 Family history of asthma and other chronic lower respiratory diseases: Secondary | ICD-10-CM | POA: Diagnosis not present

## 2015-03-11 DIAGNOSIS — F329 Major depressive disorder, single episode, unspecified: Secondary | ICD-10-CM | POA: Diagnosis present

## 2015-03-11 DIAGNOSIS — A419 Sepsis, unspecified organism: Principal | ICD-10-CM | POA: Diagnosis present

## 2015-03-11 DIAGNOSIS — R509 Fever, unspecified: Secondary | ICD-10-CM

## 2015-03-11 DIAGNOSIS — Z961 Presence of intraocular lens: Secondary | ICD-10-CM | POA: Diagnosis present

## 2015-03-11 DIAGNOSIS — R0600 Dyspnea, unspecified: Secondary | ICD-10-CM

## 2015-03-11 DIAGNOSIS — Z9841 Cataract extraction status, right eye: Secondary | ICD-10-CM

## 2015-03-11 DIAGNOSIS — Z820 Family history of epilepsy and other diseases of the nervous system: Secondary | ICD-10-CM

## 2015-03-11 DIAGNOSIS — R0602 Shortness of breath: Secondary | ICD-10-CM

## 2015-03-11 DIAGNOSIS — J154 Pneumonia due to other streptococci: Secondary | ICD-10-CM | POA: Diagnosis present

## 2015-03-11 DIAGNOSIS — G9340 Encephalopathy, unspecified: Secondary | ICD-10-CM | POA: Diagnosis present

## 2015-03-11 DIAGNOSIS — E785 Hyperlipidemia, unspecified: Secondary | ICD-10-CM | POA: Diagnosis present

## 2015-03-11 DIAGNOSIS — Z87891 Personal history of nicotine dependence: Secondary | ICD-10-CM | POA: Diagnosis not present

## 2015-03-11 DIAGNOSIS — Z882 Allergy status to sulfonamides status: Secondary | ICD-10-CM | POA: Diagnosis not present

## 2015-03-11 DIAGNOSIS — F03918 Unspecified dementia, unspecified severity, with other behavioral disturbance: Secondary | ICD-10-CM | POA: Diagnosis present

## 2015-03-11 DIAGNOSIS — M25521 Pain in right elbow: Secondary | ICD-10-CM | POA: Diagnosis not present

## 2015-03-11 DIAGNOSIS — F0390 Unspecified dementia without behavioral disturbance: Secondary | ICD-10-CM | POA: Diagnosis present

## 2015-03-11 DIAGNOSIS — F0391 Unspecified dementia with behavioral disturbance: Secondary | ICD-10-CM | POA: Diagnosis present

## 2015-03-11 DIAGNOSIS — R259 Unspecified abnormal involuntary movements: Secondary | ICD-10-CM | POA: Diagnosis not present

## 2015-03-11 DIAGNOSIS — G40909 Epilepsy, unspecified, not intractable, without status epilepticus: Secondary | ICD-10-CM | POA: Diagnosis present

## 2015-03-11 DIAGNOSIS — R413 Other amnesia: Secondary | ICD-10-CM | POA: Diagnosis present

## 2015-03-11 DIAGNOSIS — J90 Pleural effusion, not elsewhere classified: Secondary | ICD-10-CM | POA: Diagnosis not present

## 2015-03-11 DIAGNOSIS — F99 Mental disorder, not otherwise specified: Secondary | ICD-10-CM | POA: Diagnosis not present

## 2015-03-11 DIAGNOSIS — F32A Depression, unspecified: Secondary | ICD-10-CM | POA: Diagnosis present

## 2015-03-11 LAB — URINALYSIS, ROUTINE W REFLEX MICROSCOPIC
Bilirubin Urine: NEGATIVE
Glucose, UA: NEGATIVE mg/dL
HGB URINE DIPSTICK: NEGATIVE
Ketones, ur: NEGATIVE mg/dL
Leukocytes, UA: NEGATIVE
NITRITE: NEGATIVE
Protein, ur: 30 mg/dL — AB
SPECIFIC GRAVITY, URINE: 1.02 (ref 1.005–1.030)
UROBILINOGEN UA: 0.2 mg/dL (ref 0.0–1.0)
pH: 6.5 (ref 5.0–8.0)

## 2015-03-11 LAB — URINE MICROSCOPIC-ADD ON

## 2015-03-11 LAB — CBC WITH DIFFERENTIAL/PLATELET
BASOS ABS: 0 10*3/uL (ref 0.0–0.1)
BASOS PCT: 0 %
Eosinophils Absolute: 0.4 10*3/uL (ref 0.0–0.7)
Eosinophils Relative: 3 %
HEMATOCRIT: 38.3 % (ref 36.0–46.0)
HEMOGLOBIN: 12.5 g/dL (ref 12.0–15.0)
LYMPHS PCT: 2 %
Lymphs Abs: 0.3 10*3/uL — ABNORMAL LOW (ref 0.7–4.0)
MCH: 32.1 pg (ref 26.0–34.0)
MCHC: 32.6 g/dL (ref 30.0–36.0)
MCV: 98.2 fL (ref 78.0–100.0)
MONOS PCT: 4 %
Monocytes Absolute: 0.5 10*3/uL (ref 0.1–1.0)
NEUTROS ABS: 11.8 10*3/uL — AB (ref 1.7–7.7)
NEUTROS PCT: 91 %
Platelets: 157 10*3/uL (ref 150–400)
RBC: 3.9 MIL/uL (ref 3.87–5.11)
RDW: 13.6 % (ref 11.5–15.5)
WBC: 13 10*3/uL — ABNORMAL HIGH (ref 4.0–10.5)

## 2015-03-11 LAB — COMPREHENSIVE METABOLIC PANEL
ALBUMIN: 3.3 g/dL — AB (ref 3.5–5.0)
ALK PHOS: 62 U/L (ref 38–126)
ALT: 15 U/L (ref 14–54)
ANION GAP: 8 (ref 5–15)
AST: 22 U/L (ref 15–41)
BUN: 22 mg/dL — ABNORMAL HIGH (ref 6–20)
CALCIUM: 8.6 mg/dL — AB (ref 8.9–10.3)
CO2: 24 mmol/L (ref 22–32)
Chloride: 103 mmol/L (ref 101–111)
Creatinine, Ser: 0.74 mg/dL (ref 0.44–1.00)
GFR calc non Af Amer: >60 mL/min (ref >60)
GLUCOSE: 131 mg/dL — AB (ref 65–99)
POTASSIUM: 4.3 mmol/L (ref 3.5–5.1)
SODIUM: 135 mmol/L (ref 135–145)
Total Bilirubin: 0.6 mg/dL (ref 0.3–1.2)
Total Protein: 6.4 g/dL — ABNORMAL LOW (ref 6.5–8.1)

## 2015-03-11 LAB — I-STAT CG4 LACTIC ACID, ED
LACTIC ACID, VENOUS: 1.09 mmol/L (ref 0.5–2.0)
LACTIC ACID, VENOUS: 0.71 mmol/L (ref 0.5–2.0)

## 2015-03-11 MED ORDER — ONDANSETRON HCL 4 MG/2ML IJ SOLN: 4.0000 mg | Freq: Four times a day (QID) | INTRAMUSCULAR | Status: DC | PRN

## 2015-03-11 MED ORDER — VANCOMYCIN HCL IN DEXTROSE 1-5 GM/200ML-% IV SOLN: 1000.0000 mg | Freq: Once | INTRAVENOUS | Status: DC

## 2015-03-11 MED ORDER — SERTRALINE HCL 25 MG PO TABS
25.0000 mg | ORAL_TABLET | Freq: Every day | ORAL | Status: DC
  Administered 2015-03-12 – 2015-03-19 (×8): 25 mg via ORAL
  Filled 2015-03-11 (×8): qty 1

## 2015-03-11 MED ORDER — DONEPEZIL HCL 10 MG PO TABS
10.0000 mg | ORAL_TABLET | Freq: Every day | ORAL | Status: DC
  Administered 2015-03-12 – 2015-03-19 (×8): 10 mg via ORAL
  Filled 2015-03-11 (×10): qty 1

## 2015-03-11 MED ORDER — DEXTROSE 5 % IV SOLN: 2.0000 g | Freq: Once | INTRAVENOUS | Status: DC

## 2015-03-11 MED ORDER — CLONAZEPAM 0.5 MG PO TABS
0.5000 mg | ORAL_TABLET | Freq: Two times a day (BID) | ORAL | Status: DC | PRN
  Administered 2015-03-13 – 2015-03-17 (×2): 0.5 mg via ORAL
  Filled 2015-03-11 (×2): qty 1

## 2015-03-11 MED ORDER — SODIUM CHLORIDE 0.9 % IV SOLN
1000.0000 mL | INTRAVENOUS | Status: DC
  Administered 2015-03-11: 1000 mL via INTRAVENOUS

## 2015-03-11 MED ORDER — ALUM & MAG HYDROXIDE-SIMETH 200-200-20 MG/5ML PO SUSP: 30.0000 mL | Freq: Four times a day (QID) | ORAL | Status: DC | PRN

## 2015-03-11 MED ORDER — HYDROMORPHONE HCL 1 MG/ML IJ SOLN
0.5000 mg | INTRAMUSCULAR | Status: DC | PRN
  Administered 2015-03-15: 1 mg via INTRAVENOUS
  Filled 2015-03-11: qty 1

## 2015-03-11 MED ORDER — PHENYTOIN SODIUM EXTENDED 100 MG PO CAPS
200.0000 mg | ORAL_CAPSULE | Freq: Every day | ORAL | Status: DC
  Administered 2015-03-12 – 2015-03-18 (×8): 200 mg via ORAL
  Filled 2015-03-11 (×9): qty 2

## 2015-03-11 MED ORDER — LORATADINE 10 MG PO TABS
10.0000 mg | ORAL_TABLET | Freq: Every day | ORAL | Status: DC
  Administered 2015-03-12 – 2015-03-19 (×8): 10 mg via ORAL
  Filled 2015-03-11 (×9): qty 1

## 2015-03-11 MED ORDER — SODIUM CHLORIDE 0.9 % IV SOLN
INTRAVENOUS | Status: DC
  Administered 2015-03-11 – 2015-03-14 (×3): via INTRAVENOUS

## 2015-03-11 MED ORDER — ACETAMINOPHEN 325 MG PO TABS
650.0000 mg | ORAL_TABLET | Freq: Four times a day (QID) | ORAL | Status: DC | PRN
  Administered 2015-03-13 – 2015-03-19 (×6): 650 mg via ORAL
  Filled 2015-03-11 (×6): qty 2

## 2015-03-11 MED ORDER — ADULT MULTIVITAMIN W/MINERALS CH
1.0000 | ORAL_TABLET | Freq: Every day | ORAL | Status: DC
  Administered 2015-03-12 – 2015-03-19 (×8): 1 via ORAL
  Filled 2015-03-11 (×10): qty 1

## 2015-03-11 MED ORDER — OXYCODONE HCL 5 MG PO TABS: 5.0000 mg | ORAL_TABLET | ORAL | Status: DC | PRN

## 2015-03-11 MED ORDER — ENOXAPARIN SODIUM 30 MG/0.3ML ~~LOC~~ SOLN
30.0000 mg | SUBCUTANEOUS | Status: DC
  Administered 2015-03-12: 30 mg via SUBCUTANEOUS
  Filled 2015-03-11 (×2): qty 0.3

## 2015-03-11 MED ORDER — PIPERACILLIN-TAZOBACTAM 3.375 G IVPB
3.3750 g | Freq: Three times a day (TID) | INTRAVENOUS | Status: DC
  Administered 2015-03-12 – 2015-03-17 (×17): 3.375 g via INTRAVENOUS
  Filled 2015-03-11 (×17): qty 50

## 2015-03-11 MED ORDER — VANCOMYCIN HCL 500 MG IV SOLR
500.0000 mg | Freq: Two times a day (BID) | INTRAVENOUS | Status: DC
  Administered 2015-03-12 – 2015-03-14 (×5): 500 mg via INTRAVENOUS
  Filled 2015-03-11 (×5): qty 500

## 2015-03-11 MED ORDER — VITAMIN D 1000 UNITS PO TABS
5000.0000 [IU] | ORAL_TABLET | Freq: Every day | ORAL | Status: DC
  Administered 2015-03-12 – 2015-03-19 (×8): 5000 [IU] via ORAL
  Filled 2015-03-11 (×9): qty 5

## 2015-03-11 MED ORDER — OMEGA-3-ACID ETHYL ESTERS 1 G PO CAPS
1.0000 g | ORAL_CAPSULE | Freq: Every day | ORAL | Status: DC
  Administered 2015-03-12 – 2015-03-19 (×8): 1 g via ORAL
  Filled 2015-03-11 (×10): qty 1

## 2015-03-11 MED ORDER — VANCOMYCIN HCL IN DEXTROSE 1-5 GM/200ML-% IV SOLN
1000.0000 mg | Freq: Once | INTRAVENOUS | Status: AC
  Administered 2015-03-11: 1000 mg via INTRAVENOUS
  Filled 2015-03-11: qty 200

## 2015-03-11 MED ORDER — IBUPROFEN 200 MG PO TABS
600.0000 mg | ORAL_TABLET | Freq: Once | ORAL | Status: AC
  Administered 2015-03-11: 600 mg via ORAL
  Filled 2015-03-11: qty 3

## 2015-03-11 MED ORDER — SACCHAROMYCES BOULARDII 250 MG PO CAPS
250.0000 mg | ORAL_CAPSULE | Freq: Two times a day (BID) | ORAL | Status: DC
  Administered 2015-03-12 – 2015-03-19 (×16): 250 mg via ORAL
  Filled 2015-03-11 (×17): qty 1

## 2015-03-11 MED ORDER — ACETAMINOPHEN 650 MG RE SUPP: 650.0000 mg | Freq: Four times a day (QID) | RECTAL | Status: DC | PRN

## 2015-03-11 MED ORDER — ALBUTEROL SULFATE (2.5 MG/3ML) 0.083% IN NEBU
2.5000 mg | INHALATION_SOLUTION | Freq: Four times a day (QID) | RESPIRATORY_TRACT | Status: DC
  Administered 2015-03-12: 2.5 mg via RESPIRATORY_TRACT
  Filled 2015-03-11: qty 3

## 2015-03-11 MED ORDER — PIPERACILLIN-TAZOBACTAM 3.375 G IVPB 30 MIN
3.3750 g | Freq: Once | INTRAVENOUS | Status: AC
  Administered 2015-03-11: 3.375 g via INTRAVENOUS
  Filled 2015-03-11: qty 50

## 2015-03-11 MED ORDER — PHENYTOIN SODIUM EXTENDED 100 MG PO CAPS
100.0000 mg | ORAL_CAPSULE | Freq: Every day | ORAL | Status: DC
  Administered 2015-03-12 – 2015-03-19 (×8): 100 mg via ORAL
  Filled 2015-03-11 (×9): qty 1

## 2015-03-11 MED ORDER — ONDANSETRON HCL 4 MG PO TABS: 4.0000 mg | ORAL_TABLET | Freq: Four times a day (QID) | ORAL | Status: DC | PRN

## 2015-03-11 MED ORDER — LEVOFLOXACIN IN D5W 750 MG/150ML IV SOLN: 750.0000 mg | Freq: Once | INTRAVENOUS | Status: DC

## 2015-03-11 MED ORDER — CALCIUM CARBONATE-VITAMIN D 500-200 MG-UNIT PO TABS
1.0000 | ORAL_TABLET | Freq: Every day | ORAL | Status: DC
  Administered 2015-03-12 – 2015-03-19 (×8): 1 via ORAL
  Filled 2015-03-11 (×10): qty 1

## 2015-03-11 MED ORDER — LEVOTHYROXINE SODIUM 100 MCG PO TABS
100.0000 ug | ORAL_TABLET | Freq: Every day | ORAL | Status: DC
  Administered 2015-03-12 – 2015-03-19 (×8): 100 ug via ORAL
  Filled 2015-03-11 (×11): qty 1

## 2015-03-11 MED ORDER — SODIUM CHLORIDE 0.9 % IV BOLUS (SEPSIS)
1000.0000 mL | Freq: Once | INTRAVENOUS | Status: AC
  Administered 2015-03-11: 1000 mL via INTRAVENOUS

## 2015-03-11 MED ORDER — PIPERACILLIN-TAZOBACTAM 3.375 G IVPB 30 MIN
3.3750 g | Freq: Three times a day (TID) | INTRAVENOUS | Status: DC
  Filled 2015-03-11: qty 50

## 2015-03-11 NOTE — ED Notes (Signed)
EKG given to Dr. Wyvonnia Dusky

## 2015-03-11 NOTE — ED Provider Notes (Signed)
CSN: [id number]     Arrival date & time 03/11/15  43 History   First MD Initiated Contact with Patient 03/11/15 1804     Chief Complaint  Patient presents with  . Altered Mental Status     (Consider location/radiation/quality/duration/timing/severity/associated sxs/prior Treatment) HPI Comments: Rebecca [name] is a 81 y.o. female with a PMHx of osteoporosis, seizures, DJD, hypothyroidism, diverticulosis, hemorrhoids, low back pain, thickened mitral valve, vitamin A and D deficiency, anemia, sacroiliitis, and dysuria, who presents to the ED via EMS from [facility], who were called out to the facility for reports of increased confusion today and a fever with Tmax of 103.5, patient was given Tylenol at 4:45 PM, but upon arrival her temperature is still 102.9. The facility reported to EMS that she has been treated for a UTI with Macrobid, ongoing treatment 2 days. LEVEL 5 CAVEAT: AMS and no caregiver at bedside, which limits history. The patient denies any complaints, she says no to every question that is asked, and then intermittently laughs, specifically she denied any headache, sore throat chest pain, shortness of breath, cough, abdominal pain, nausea, vomiting, or diarrhea. She is unsure of any urinary complaints.  Upon arrival, patient's oxygen saturation was in the 80s, which improved after oxygen was placed, patient arrived with a nasal cannula in place, but there is no documentation of whether she uses home oxygen on a regular basis.  Patient is a 81 y.o. female presenting with altered mental status. The history is provided by the patient and the EMS personnel. The history is limited by the condition of the patient and the absence of a caregiver. No language interpreter was used.  Altered Mental Status Presenting symptoms: confusion   Severity:  Moderate Most recent episode:  Today Episode history:  Continuous Duration:  1 day Timing:  Constant Progression:   Unchanged Chronicity:  New Context: dementia, nursing home resident and recent infection   Associated symptoms: fever (Tmax 103.5)   Associated symptoms: no abdominal pain, no headaches, no nausea and no vomiting     Past Medical History  Diagnosis Date  . Osteoporosis 02/2011  . Seizures ([place])   . DJD (degenerative joint disease)   . Thyroid disease   . Diverticulosis 02/2011  . Hemorrhoids 02/2011  . Dizziness   . Low back pain   . Mitral valve problem thickened    thickened  . Unspecified hypothyroidism 01/2011  . 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 Vitamin D deficiency 01/2011  . Anemia, unspecified 03/18/2011  . Anxiety state, unspecified 01/2011  . 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 Pain in joint, site unspecified 01/2012  . Sacroiliitis, not elsewhere classified ([place]) 05/2011  . Dysuria 06/17/2011  . Other abnormal blood chemistry 0/30/2012  . Blood in stool 06/15/2012  . Other acquired deformity of toe 12/16/2011  . Corns and callosities 07/01/2011   Past Surgical History  Procedure Laterality Date  . Cervical laminectomy  2005  . Bletheroplasty    . Hammer toe surgery  2013    right 2nd toe Dr. Mallie Mussel  . Joint replacement Bilateral 2002 Right, 1992 Left    knees  . Cataract extraction w/ intraocular lens  implant, bilateral  2010   Family History  Problem Relation Age of Onset  . Alzheimer's disease Sister   . Emphysema Sister    Social History  Substance Use Topics  . Smoking status: Former Smoker    Quit date:  09/14/1988  . Smokeless tobacco: Never Used  . Alcohol Use: 0.6 oz/week    1 Glasses of wine per week     Comment: twice a month   OB History    No data available      LEVEL 5 CAVEAT: Altered mental status, pt repeats "no" to every question Review of Systems  Unable to perform ROS: Mental status change  Constitutional: Positive for fever (Tmax 103.5).  HENT: Negative for sore  throat.   Respiratory: Negative for shortness of breath.   Cardiovascular: Negative for chest pain.  Gastrointestinal: Negative for nausea, vomiting, abdominal pain and diarrhea.  Neurological: Negative for headaches.  Psychiatric/Behavioral: Positive for confusion.    Allergies  Morphine and related and Sulfa antibiotics  Home Medications   Prior to Admission medications   Medication Sig Start Date End Date Taking? Authorizing Provider  acetaminophen (TYLENOL) 650 MG CR tablet Take 650 mg by mouth daily as needed for pain.    Historical Provider, MD  amoxicillin (AMOXIL) 500 MG capsule Take 4 capsules one hour prior to dental appointment Patient taking differently: Take 2,000 mg by mouth once. Take 4 capsules one hour prior to dental appointment 07/04/14   Estill Dooms, MD  CALCIUM PO Take 1 tablet by mouth daily.    Historical Provider, MD  cetirizine (KLS ALLER-TEC) 10 MG tablet Take 10 mg by mouth daily. As needed for allergies    Historical Provider, MD  Cholecalciferol (VITAMIN D-3) 5000 UNITS TABS Take 5,000 Units by mouth daily.     Historical Provider, MD  clonazePAM (KLONOPIN) 0.5 MG tablet Take 1 tablet (0.5 mg total) by mouth 2 (two) times daily as needed for anxiety. 01/12/15   Christina P Rama, MD  donepezil (ARICEPT) 5 MG tablet TAKE 1 TAB BY MOUTH EVERY MORNING FOR 28 DAYS (4 WEEKS) THEN INCREASE TO '10MG'$  02/22/15   Estill Dooms, MD  levothyroxine (SYNTHROID, LEVOTHROID) 100 MCG tablet Take 1 tablet (100 mcg total) by mouth daily before breakfast. For thyroid 09/28/14   Estill Dooms, MD  meloxicam (MOBIC) 15 MG tablet TAKE 1 TABLET BY MOUTH ONCE DAILY TO HELP ARTHRITIS Patient taking differently: Take 15 mg by mouth daily.  09/28/14   Estill Dooms, MD  mometasone (ELOCON) 0.1 % cream Apply to outer ear once a day as needed for itching 11/16/13   Historical Provider, MD  Multiple Vitamin (MULTIVITAMINS PO) Take 1 tablet by mouth daily. Take one tablet daily    Historical  Provider, MD  omega-3 acid ethyl esters (LOVAZA) 1 G capsule Take 1 g by mouth daily. Fatty Acid one daily    Historical Provider, MD  ondansetron (ZOFRAN) 4 MG tablet Take 1 tablet (4 mg total) by mouth every 6 (six) hours as needed for nausea. 01/12/15   Venetia Maxon Rama, MD  phenytoin (DILANTIN) 100 MG ER capsule Take 1 capsule by mouth in  the morning and 2 capsules  by mouth at bedtime to prevent seizure. Patient taking differently: Take 100-200 mg by mouth 2 (two) times daily. Take 100 mg in the morning and 200 mg by mouth at bedtime 09/26/14   Estill Dooms, MD  sertraline (ZOLOFT) 50 MG tablet One daily to improve anxiety and depression 01/30/15   Estill Dooms, MD  traMADol (ULTRAM) 50 MG tablet Take 1 tablet (50 mg total) by mouth every 6 (six) hours as needed for moderate pain or severe pain. 01/12/15   Venetia Maxon Rama, MD  BP 120/60 mmHg  Pulse 89  Temp(Src) 102.9 F (39.4 C) (Oral)  Resp 22  SpO2 92% Physical Exam  Constitutional: She appears well-developed and well-nourished. No distress.  Febrile at 102.9, NAD  HENT:  Head: Normocephalic and atraumatic.  Right Ear: Hearing, tympanic membrane, external ear and ear canal normal.  Left Ear: Hearing, tympanic membrane, external ear and ear canal normal.  Nose: Nose normal.  Mouth/Throat: Uvula is midline, oropharynx is clear and moist and mucous membranes are normal. No trismus in the jaw. No uvula swelling.  Ears are clear bilaterally. Nose clear. Oropharynx clear and moist, without uvular swelling or deviation, no trismus or drooling, no tonsillar swelling or erythema, no exudates.    Eyes: Conjunctivae and EOM are normal. Right eye exhibits no discharge. Left eye exhibits no discharge.  Neck: Normal range of motion. Neck supple.  No meningismus  Cardiovascular: Normal rate, regular rhythm, normal heart sounds and intact distal pulses.  Exam reveals no gallop and no friction rub.   No murmur heard. Pulmonary/Chest: Effort  normal and breath sounds normal. No respiratory distress. She has no decreased breath sounds. She has no wheezes. She has no rhonchi. She has no rales.  CTAB in all lung fields, no w/r/r, no increased WOB, speaking in full sentences, SpO2 88% on RA but SpO2 92-96% on 2L via [place]   Abdominal: Soft. Normal appearance and bowel sounds are normal. She exhibits no distension. There is no tenderness. There is no rigidity, no rebound, no guarding, no CVA tenderness, no tenderness at McBurney's point and negative Murphy's sign.  Musculoskeletal: Normal range of motion.  MAE x4 Strength and sensation grossly intact Distal pulses intact No pedal edema  Neurological: She is alert. She has normal strength. No sensory deficit.  A&O x2, not oriented to time (which is baseline) Sensation and strength intact  Skin: Skin is warm, dry and intact. No rash noted.  Psychiatric: She has a normal mood and affect.  Nursing note and vitals reviewed.   ED Course  Procedures (including critical care time) Labs Review Labs Reviewed  COMPREHENSIVE METABOLIC PANEL - Abnormal; Notable for the following:    Glucose, Bld 131 (*)    BUN 22 (*)    Calcium 8.6 (*)    Total Protein 6.4 (*)    Albumin 3.3 (*)    All other components within normal limits  CBC WITH DIFFERENTIAL/PLATELET - Abnormal; Notable for the following:    WBC 13.0 (*)    Neutro Abs 11.8 (*)    Lymphs Abs 0.3 (*)    All other components within normal limits  URINALYSIS, ROUTINE W REFLEX MICROSCOPIC (NOT AT [facility]) - Abnormal; Notable for the following:    Color, Urine AMBER (*)    Protein, ur 30 (*)    All other components within normal limits  CULTURE, BLOOD (ROUTINE X 2)  CULTURE, BLOOD (ROUTINE X 2)  URINE CULTURE  URINE MICROSCOPIC-ADD ON  INFLUENZA PANEL BY PCR (TYPE A & B, H1N1)  I-STAT CG4 LACTIC ACID, ED  I-STAT CG4 LACTIC ACID, ED    Imaging Review Dg Chest 2 View  03/11/2015  CLINICAL DATA:  Sepsis. Fever. Altered mental status.  Urinary tract infection. EXAM: CHEST  2 VIEW COMPARISON:  Chest x-rays dated 07/25/2013 and 01/28/2011 and 01/22/2011 FINDINGS: Heart size and pulmonary vascularity are normal. The patient has developed patchy bilateral peripheral densities which are similar to those present in September 2012. No effusions. No acute osseous abnormality. IMPRESSION: Patchy bilateral pulmonary  infiltrates. Findings are consistent with pneumonia/pneumonitis. Electronically Signed   By: Lorriane Shire M.D.   On: 03/11/2015 18:54   I have personally reviewed and evaluated these images and lab results as part of my medical decision-making.   EKG Interpretation None      MDM   Final diagnoses:  Altered mental status, unspecified altered mental status type  Other specified fever  Healthcare-associated pneumonia  Leukocytosis    81 y.o. female here with increased confusion and fever 102.9 upon arrival, TMax at facility 103.5. Tylenol given at 4:45pm. Being treated with macrobid. Pt answers no to everything, unclear if she is in any pain or has any focal symptoms, but given recent dx of UTI this is likely the source. Will order sepsis work up. Pt with some desats, improves once 2L via [place] placed, unclear if she uses oxygen at home, will get CXR. Will start work up and reassess shortly.   8:36 PM EKG nonischemic, although poor quality. Lactic WNL. CMP with baseline BUN of 22. CBC w/diff showing leukocytosis of 13 with neutrophilic predominance. Still awaiting U/A. CXR showing ?PNA which could explain her oxygen requirement. BP 113/55 now, temp 99.8 at last check. Daughter at bedside states she's at her baseline mentally, and confirms that pt doesn't use oxygen at home, and that she is a DNR. Pt continues to deny CP/SOB. Doubt PE. Will await U/A and then proceed with admission.   10:23 PM U/A with 30 proteins, rare squamous, neg nitrites and leuks, no evidence of UTI. Repeat lactic WNL. Appears that possibly her source of  fever/AMS is the PNA seen on CXR. Will proceed with admission. VSS at this time.   10:37 PM Dr. Arnoldo Morale returning page, will admit to step down. Please see her notes for further documentation of care.  BP 100/58 mmHg  Pulse 76  Temp(Src) 98.7 F (37.1 C) (Oral)  Resp 19  Ht 5' (1.524 m)  Wt 126 lb (57.153 kg)  BMI 24.61 kg/m2  SpO2 95%  Meds ordered this encounter  Medications  . sodium chloride 0.9 % bolus 1,000 mL    Sig:     Order Specific Question:  Enter Patient Weight in Kilograms    Answer:  56  . 0.9 %  sodium chloride infusion    Sig:   . DISCONTD: levofloxacin (LEVAQUIN) IVPB 750 mg    Sig:     Order Specific Question:  Antibiotic Indication:    Answer:  Other Indication (list below)    Order Specific Question:  Other Indication:    Answer:  Sepsis  . DISCONTD: aztreonam (AZACTAM) 2 g in dextrose 5 % 50 mL IVPB    Sig:     Order Specific Question:  Antibiotic Indication:    Answer:  Sepsis  . DISCONTD: vancomycin (VANCOCIN) IVPB 1000 mg/200 mL premix    Sig:     Order Specific Question:  Indication:    Answer:  Sepsis  . ibuprofen (ADVIL,MOTRIN) tablet 600 mg    Sig:   . piperacillin-tazobactam (ZOSYN) IVPB 3.375 g    Sig:     Order Specific Question:  Antibiotic Indication:    Answer:  Sepsis  . vancomycin (VANCOCIN) IVPB 1000 mg/200 mL premix    Sig:     Order Specific Question:  Indication:    Answer:  Sepsis  . Calcium Carbonate-Vitamin D (CALCIUM 600+D) 600-400 MG-UNIT tablet    Sig: Take 1 tablet by mouth daily.  Marland Kitchen donepezil (ARICEPT) 10 MG tablet  Sig: Take 10 mg by mouth daily.  . piperacillin-tazobactam (ZOSYN) IVPB 3.375 g    Sig:     Order Specific Question:  Antibiotic Indication:    Answer:  Sepsis  . nitrofurantoin, macrocrystal-monohydrate, (MACROBID) 100 MG capsule    Sig: Take 100 mg by mouth 2 (two) times daily.  Marland Kitchen saccharomyces boulardii (FLORASTOR) 250 MG capsule    Sig: Take 250 mg by mouth 2 (two) times daily.  .  diphenhydramine-acetaminophen (TYLENOL PM) 25-500 MG TABS tablet    Sig: Take 1 tablet by mouth at bedtime as needed (sleep).  . vancomycin (VANCOCIN) 500 mg in sodium chloride 0.9 % 100 mL IVPB    Sig:     Order Specific Question:  Indication:    Answer:  Sepsis     Bailey Kolbe Camprubi-Soms, PA-C 03/11/15 2237  Ezequiel Essex, MD 03/12/15 [phone]

## 2015-03-11 NOTE — ED Notes (Signed)
Pt ready for admission and transport to room.

## 2015-03-11 NOTE — Progress Notes (Signed)
ANTIBIOTIC CONSULT NOTE - INITIAL  Pharmacy Consult for Vancomycin, Zosyn Indication: rule out sepsis  Allergies  Allergen Reactions  . Morphine And Related Other (See Comments)    Altered mental state   . Sulfa Antibiotics Other (See Comments)    Reaction: unknown    Patient Measurements: Height: 5' (152.4 cm) Weight: 126 lb (57.153 kg) IBW/kg (Calculated) : 45.5  Vital Signs: Temp: 102.9 F (39.4 C) (10/23 1810) Temp Source: Oral (10/23 1810) BP: 116/59 mmHg (10/23 1900) Pulse Rate: 85 (10/23 1900) Intake/Output from previous day:   Intake/Output from this shift:    Labs:  Recent Labs  03/11/15 1838  WBC 13.0*  HGB 12.5  PLT 157  CREATININE 0.74   Estimated Creatinine Clearance: 40.7 mL/min (by C-G formula based on Cr of 0.74). No results for input(s): VANCOTROUGH, VANCOPEAK, VANCORANDOM, GENTTROUGH, GENTPEAK, GENTRANDOM, TOBRATROUGH, TOBRAPEAK, TOBRARND, AMIKACINPEAK, AMIKACINTROU, AMIKACIN in the last 72 hours.   Microbiology: No results found for this or any previous visit (from the past 720 hour(s)).  Medical History: Past Medical History  Diagnosis Date  . Osteoporosis 02/2011  . Seizures ([place])   . DJD (degenerative joint disease)   . Thyroid disease   . Diverticulosis 02/2011  . Hemorrhoids 02/2011  . Dizziness   . Low back pain   . Mitral valve problem thickened    thickened  . Unspecified hypothyroidism 01/2011  . 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 Vitamin D deficiency 01/2011  . Anemia, unspecified 03/18/2011  . Anxiety state, unspecified 01/2011  . 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 Pain in joint, site unspecified 01/2012  . Sacroiliitis, not elsewhere classified ([place]) 05/2011  . Dysuria 06/17/2011  . Other abnormal blood chemistry 0/30/2012  . Blood in stool 06/15/2012  . Other acquired deformity of toe 12/16/2011  . Corns and callosities 07/01/2011    Assessment: 89 y/oF with  PMH of seizures, DJD, osteoporosis hypothyroidism, anemia currently being treated with Macrobid for UTI who presents to [facility] ED fom [facility] due to altered mental status and fever. CXR shows patchy bilateral pulmonary infiltrates; findings are consistent with pneumonia/pneumonitis. UA with cloudy urine, many bacteria, large leukocytes, nitrite positive. Pharmacy consulted to assist with dosing of Vancomycin and Zosyn..   10/23 >> Vancomycin >> 10/23 >> Zosyn >>    10/23 blood x 2: sent 10/23 urine: sent   Tmax 102.93F WBC elevated at 13K SCr 0.74 with CrCl ~ 41 ml/min CG  Goal of Therapy:  Vancomycin trough level 15-20 mcg/ml  Appropriate antibiotic dosing for renal function and indication Eradication of infection  Plan:   Vancomycin 1g IV x 1 ordered in ED. Continue with Vancomycin '500mg'$  IV q12h.  Plan for Vancomycin trough level at steady state.  Zosyn 3.375g IV x 1 over 30 min ordered in ED. Continue with Zosyn 3.375g IV q8h (infuse over 4 hours).  Monitor renal function, cultures, clinical course.   Lindell Spar, PharmD, BCPS Pager: [phone] 03/11/2015 7:43 PM

## 2015-03-11 NOTE — H&P (Signed)
[facility] Admission History and Physical       Rebecca [name] TDD:[id number] DOB: Jul 14, 1927 DOA: 03/11/2015  Referring physician: EDP PCP: Estill Dooms, MD  Specialists:   Chief Complaint: Fever  HPI: Rebecca [name] is a 81 y.o. female  resident of the [place] who was sent to the ED due to FEvers to 103.5 and confusion which began today.    She denies any cough or dysuria.    She was evaluated and found to have hypoxia with O2 sats to 83% Bilateral pneumonia on Chest X-ray and was placed on IV Antibiotics for Sepsis/HCAP and supplemental NCO2 and referred for admission.      Review of Systems:  Constitutional: No Weight Loss, No Weight Gain, Night Sweats, +Fevers, +Chills, Dizziness, Light Headedness, Fatigue, or Generalized Weakness HEENT: No Headaches, Difficulty Swallowing,Tooth/Dental Problems,Sore Throat,  No Sneezing, Rhinitis, Ear Ache, Nasal Congestion, or Post Nasal Drip,  Cardio-vascular:  No Chest pain, Orthopnea, PND, Edema in Lower Extremities, Anasarca, Dizziness, Palpitations  Resp: No Dyspnea, No DOE, No Productive Cough, No Non-Productive Cough, No Hemoptysis, No Wheezing.    GI: No Heartburn, Indigestion, Abdominal Pain, Nausea, Vomiting, Diarrhea, Constipation, Hematemesis, Hematochezia, Melena, Change in Bowel Habits,  Loss of Appetite  GU: No Dysuria, No Change in Color of Urine, No Urgency or Urinary Frequency, No Flank pain.  Musculoskeletal: No Joint Pain or Swelling, No Decreased Range of Motion, No Back Pain.  Neurologic: No Syncope, No Seizures, Muscle Weakness, Paresthesia, Vision Disturbance or Loss, No Diplopia, No Vertigo, No Difficulty Walking,  Skin: No Rash or Lesions. Psych: No Change in Mood or Affect, No Depression or Anxiety, No Memory loss, No Confusion, or Hallucinations   Past Medical History  Diagnosis Date  . Osteoporosis 02/2011  . Seizures ([place])   . DJD (degenerative joint disease)   .  Diverticulosis 02/2011  . Hemorrhoids 02/2011  . Dizziness   . Low back pain   . Mitral valve problem thickened    thickened  . Unspecified hypothyroidism 01/2011  . 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 Vitamin D deficiency 01/2011  . Anemia, unspecified 03/18/2011  . Anxiety state, unspecified 01/2011  . 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 Pain in joint, site unspecified 01/2012  . Sacroiliitis, not elsewhere classified ([place]) 05/2011  . Dysuria 06/17/2011  . Other abnormal blood chemistry 0/30/2012  . Blood in stool 06/15/2012  . Other acquired deformity of toe 12/16/2011  . Corns and callosities 07/01/2011     Past Surgical History  Procedure Laterality Date  . Cervical laminectomy  2005  . Bletheroplasty    . Hammer toe surgery  2013    right 2nd toe Dr. Mallie Mussel  . Joint replacement Bilateral 2002 Right, 1992 Left    knees  . Cataract extraction w/ intraocular lens  implant, bilateral  2010      Prior to Admission medications   Medication Sig Start Date End Date Taking? Authorizing Provider  acetaminophen (TYLENOL) 650 MG CR tablet Take 650 mg by mouth daily as needed for pain.   Yes Historical Provider, MD  amoxicillin (AMOXIL) 500 MG capsule Take 4 capsules one hour prior to dental appointment Patient taking differently: Take 2,000 mg by mouth once. Take 4 capsules one hour prior to dental appointment 07/04/14  Yes Estill Dooms, MD  Calcium Carbonate-Vitamin D (CALCIUM 600+D) 600-400 MG-UNIT tablet Take 1 tablet by mouth daily.  Yes Historical Provider, MD  cetirizine (KLS ALLER-TEC) 10 MG tablet Take 10 mg by mouth daily as needed for allergies.    Yes Historical Provider, MD  Cholecalciferol (VITAMIN D-3) 5000 UNITS TABS Take 5,000 Units by mouth daily.    Yes Historical Provider, MD  clonazePAM (KLONOPIN) 0.5 MG tablet Take 1 tablet (0.5 mg total) by mouth 2 (two) times daily as needed for anxiety. 01/12/15  Yes  Christina P Rama, MD  diphenhydramine-acetaminophen (TYLENOL PM) 25-500 MG TABS tablet Take 1 tablet by mouth at bedtime as needed (sleep).   Yes Historical Provider, MD  donepezil (ARICEPT) 10 MG tablet Take 10 mg by mouth daily.   Yes Historical Provider, MD  levothyroxine (SYNTHROID, LEVOTHROID) 100 MCG tablet Take 1 tablet (100 mcg total) by mouth daily before breakfast. For thyroid 09/28/14  Yes Estill Dooms, MD  meloxicam (MOBIC) 15 MG tablet TAKE 1 TABLET BY MOUTH ONCE DAILY TO HELP ARTHRITIS Patient taking differently: Take 15 mg by mouth daily.  09/28/14  Yes Estill Dooms, MD  Multiple Vitamin (MULTIVITAMINS PO) Take 1 tablet by mouth daily.    Yes Historical Provider, MD  nitrofurantoin, macrocrystal-monohydrate, (MACROBID) 100 MG capsule Take 100 mg by mouth 2 (two) times daily.   Yes Historical Provider, MD  omega-3 acid ethyl esters (LOVAZA) 1 G capsule Take 1 g by mouth daily. Fatty Acid one daily   Yes Historical Provider, MD  ondansetron (ZOFRAN) 4 MG tablet Take 1 tablet (4 mg total) by mouth every 6 (six) hours as needed for nausea. 01/12/15  Yes Venetia Maxon Rama, MD  phenytoin (DILANTIN) 100 MG ER capsule Take 1 capsule by mouth in  the morning and 2 capsules  by mouth at bedtime to prevent seizure. Patient taking differently: Take 100-200 mg by mouth 2 (two) times daily. Take 100 mg in the morning and 200 mg by mouth at bedtime 09/26/14  Yes Estill Dooms, MD  saccharomyces boulardii (FLORASTOR) 250 MG capsule Take 250 mg by mouth 2 (two) times daily.   Yes Historical Provider, MD  sertraline (ZOLOFT) 50 MG tablet One daily to improve anxiety and depression Patient taking differently: Take 25 mg by mouth daily.  01/30/15  Yes Estill Dooms, MD  traMADol (ULTRAM) 50 MG tablet Take 1 tablet (50 mg total) by mouth every 6 (six) hours as needed for moderate pain or severe pain. 01/12/15  Yes Christina P Rama, MD  donepezil (ARICEPT) 5 MG tablet TAKE 1 TAB BY MOUTH EVERY MORNING FOR 28  DAYS (4 WEEKS) THEN INCREASE TO '10MG'$  Patient not taking: Reported on 03/11/2015 02/22/15   Estill Dooms, MD     Allergies  Allergen Reactions  . Morphine And Related Other (See Comments)    Altered mental state   . Sulfa Antibiotics Other (See Comments)    Reaction: unknown    Social History:  reports that she quit smoking about 26 years ago. She has never used smokeless tobacco. She reports that she drinks about 0.6 oz of alcohol per week. She reports that she does not use illicit drugs.    Family History  Problem Relation Age of Onset  . Alzheimer's disease Sister   . Emphysema Sister       Physical Exam:  GEN:  Pleasant Elderly Well Nourished and Well Developed 81 y.o. Caucasian female examined and in no acute distress; cooperative with exam Filed Vitals:   03/11/15 2000 03/11/15 2030 03/11/15 2130 03/11/15 2133  BP: 80/64 113/55 100/58  Pulse:  76    Temp:    98.7 F (37.1 C)  TempSrc:    Oral  Resp: '22 26 19   '$ Height:      Weight:      SpO2:  95%     Blood pressure 100/58, pulse 76, temperature 98.7 F (37.1 C), temperature source Oral, resp. rate 19, height 5' (1.524 m), weight 57.153 kg (126 lb), SpO2 95 %. PSYCH: She is alert and oriented x 2; does not appear anxious does not appear depressed; affect is normal HEENT: Normocephalic and Atraumatic, Mucous membranes pink; PERRLA; EOM intact; Fundi:  Benign;  No scleral icterus, Nares: Patent, Oropharynx: Clear, Fair Dentition,    Neck:  FROM, No Cervical Lymphadenopathy nor Thyromegaly or Carotid Bruit; No JVD; Breasts:: Not examined CHEST WALL: No tenderness CHEST: Normal respiration, clear to auscultation bilaterally HEART: Regular rate and rhythm; no murmurs rubs or gallops BACK: No kyphosis or scoliosis; No CVA tenderness ABDOMEN: Positive Bowel Sounds, Soft Non-Tender, No Rebound or Guarding; No Masses, No Organomegaly Rectal Exam: Not done EXTREMITIES: No Cyanosis, Clubbing, or Edema; No  Ulcerations. Genitalia: not examined PULSES: 2+ and symmetric SKIN: Normal hydration no rash or ulceration CNS:  Alert and Oriented x 2, No Focal Deficits Vascular: pulses palpable throughout    Labs on Admission:  Basic Metabolic Panel:  Recent Labs Lab 03/08/15 03/11/15 1838  NA 142 135  K 4.5 4.3  CL  --  103  CO2  --  24  GLUCOSE  --  131*  BUN 21 22*  CREATININE 0.6 0.74  CALCIUM  --  8.6*   Liver Function Tests:  Recent Labs Lab 03/08/15 03/11/15 1838  AST 20 22  ALT 13 15  ALKPHOS 74 62  BILITOT  --  0.6  PROT  --  6.4*  ALBUMIN  --  3.3*   No results for input(s): LIPASE, AMYLASE in the last 168 hours. No results for input(s): AMMONIA in the last 168 hours. CBC:  Recent Labs Lab 03/08/15 03/11/15 1838  WBC 6.2 13.0*  NEUTROABS  --  11.8*  HGB 13.5 12.5  HCT 40 38.3  MCV  --  98.2  PLT 200 157   Cardiac Enzymes: No results for input(s): CKTOTAL, CKMB, CKMBINDEX, TROPONINI in the last 168 hours.  BNP (last 3 results) No results for input(s): BNP in the last 8760 hours.  ProBNP (last 3 results) No results for input(s): PROBNP in the last 8760 hours.  CBG: No results for input(s): GLUCAP in the last 168 hours.  Radiological Exams on Admission: Dg Chest 2 View  03/11/2015  CLINICAL DATA:  Sepsis. Fever. Altered mental status. Urinary tract infection. EXAM: CHEST  2 VIEW COMPARISON:  Chest x-rays dated 07/25/2013 and 01/28/2011 and 01/22/2011 FINDINGS: Heart size and pulmonary vascularity are normal. The patient has developed patchy bilateral peripheral densities which are similar to those present in September 2012. No effusions. No acute osseous abnormality. IMPRESSION: Patchy bilateral pulmonary infiltrates. Findings are consistent with pneumonia/pneumonitis. Electronically Signed   By: Lorriane Shire M.D.   On: 03/11/2015 18:54      Assessment/Plan:     81 y.o. female with  Principal Problem:   1.     Healthcare-associated pneumonia/ Sepsis  ([place])   IV Vancomycin and Zosyn   Albuterol Nebs   IVFs   NCO2 PRN   Check Lactic Acid Levels    Active Problems:   2.     Acute encephalopathy- due to Hypoxia  Monitor     3.     Hypoxia- due to #1   NCO2 Titrate PRN   Monitor O2 sats     4.     Leukocytosis- Due to #1   Monitor Trend     5.     Hypothyroidism   Continue Levothyroxine Rx   Check TSH level     6.   Seizure disorder   On dilantin Rx   Check Dilantin Level    7.    Memory Impairment   Continue Aricept Rx     8.     DVT Prophylaxis   Lovenox     Code Status:     DO NOT RESUSCITATE (DNR)      Family Communication:   Daughter at Bedside       Disposition Plan:    Inpatient Status        Time spent:  55 Minutes      Theressa Millard [facility] Pager [phone]   If [place] Please Contact the Day Rounding Team MD for [facility]  If 7PM-7AM, Please Contact Night-Floor Coverage  [email or web address] Password [email or web address] 03/11/2015, 11:00 PM     ADDENDUM:   Patient was seen and examined on 03/11/2015

## 2015-03-11 NOTE — ED Notes (Signed)
Bed: GU54 Expected date: 03/11/15 Expected time: 5:56 PM Means of arrival: Ambulance Comments: Septic from Nursing home UTI

## 2015-03-11 NOTE — ED Notes (Addendum)
Pt arrived via EMS from [facility] with report of increase in confusion and fever (103.5) and pt was given Tylenol at 1645 at facility.  Pt currently being treated for UTI with Macrobid since 03/09/15. Pt noted desating to 83-85%RA. Started on O2 at 2lpm via [place] with improvement at 92%.

## 2015-03-11 NOTE — ED Notes (Signed)
Patient transported to X-ray 

## 2015-03-11 NOTE — ED Notes (Signed)
Pt returned from x-ray without distress noted.

## 2015-03-12 DIAGNOSIS — A419 Sepsis, unspecified organism: Secondary | ICD-10-CM | POA: Diagnosis present

## 2015-03-12 DIAGNOSIS — J9601 Acute respiratory failure with hypoxia: Secondary | ICD-10-CM

## 2015-03-12 DIAGNOSIS — E038 Other specified hypothyroidism: Secondary | ICD-10-CM

## 2015-03-12 DIAGNOSIS — D72829 Elevated white blood cell count, unspecified: Secondary | ICD-10-CM

## 2015-03-12 DIAGNOSIS — G40909 Epilepsy, unspecified, not intractable, without status epilepticus: Secondary | ICD-10-CM

## 2015-03-12 DIAGNOSIS — E785 Hyperlipidemia, unspecified: Secondary | ICD-10-CM | POA: Diagnosis present

## 2015-03-12 DIAGNOSIS — F329 Major depressive disorder, single episode, unspecified: Secondary | ICD-10-CM

## 2015-03-12 DIAGNOSIS — R413 Other amnesia: Secondary | ICD-10-CM

## 2015-03-12 DIAGNOSIS — J189 Pneumonia, unspecified organism: Secondary | ICD-10-CM

## 2015-03-12 DIAGNOSIS — G9340 Encephalopathy, unspecified: Secondary | ICD-10-CM

## 2015-03-12 LAB — PROTIME-INR
INR: 1.17 (ref 0.00–1.49)
PROTHROMBIN TIME: 15.1 s (ref 11.6–15.2)

## 2015-03-12 LAB — BASIC METABOLIC PANEL
Anion gap: 7 (ref 5–15)
BUN: 16 mg/dL (ref 6–20)
CALCIUM: 8.4 mg/dL — AB (ref 8.9–10.3)
CO2: 26 mmol/L (ref 22–32)
CREATININE: 0.7 mg/dL (ref 0.44–1.00)
Chloride: 109 mmol/L (ref 101–111)
GFR calc Af Amer: >60 mL/min (ref >60)
GLUCOSE: 123 mg/dL — AB (ref 65–99)
Potassium: 3.7 mmol/L (ref 3.5–5.1)
Sodium: 142 mmol/L (ref 135–145)

## 2015-03-12 LAB — INFLUENZA PANEL BY PCR (TYPE A & B)
H1N1FLUPCR: NOT DETECTED
Influenza A By PCR: NEGATIVE
Influenza B By PCR: NEGATIVE

## 2015-03-12 LAB — APTT: APTT: 37 s (ref 24–37)

## 2015-03-12 LAB — CBC
HEMATOCRIT: 39 % (ref 36.0–46.0)
Hemoglobin: 12.6 g/dL (ref 12.0–15.0)
MCH: 32.5 pg (ref 26.0–34.0)
MCHC: 32.3 g/dL (ref 30.0–36.0)
MCV: 100.5 fL — ABNORMAL HIGH (ref 78.0–100.0)
PLATELETS: 155 10*3/uL (ref 150–400)
RBC: 3.88 MIL/uL (ref 3.87–5.11)
RDW: 13.7 % (ref 11.5–15.5)
WBC: 11 10*3/uL — ABNORMAL HIGH (ref 4.0–10.5)

## 2015-03-12 LAB — LACTIC ACID, PLASMA
LACTIC ACID, VENOUS: 2.3 mmol/L — AB (ref 0.5–2.0)
Lactic Acid, Venous: 0.8 mmol/L (ref 0.5–2.0)

## 2015-03-12 LAB — MRSA PCR SCREENING: MRSA BY PCR: NEGATIVE

## 2015-03-12 LAB — PROCALCITONIN: PROCALCITONIN: 0.31 ng/mL

## 2015-03-12 MED ORDER — IPRATROPIUM-ALBUTEROL 0.5-2.5 (3) MG/3ML IN SOLN
3.0000 mL | Freq: Three times a day (TID) | RESPIRATORY_TRACT | Status: DC
  Administered 2015-03-12 – 2015-03-16 (×13): 3 mL via RESPIRATORY_TRACT
  Filled 2015-03-12 (×13): qty 3

## 2015-03-12 MED ORDER — SODIUM CHLORIDE 0.9 % IV BOLUS (SEPSIS)
500.0000 mL | Freq: Once | INTRAVENOUS | Status: AC
  Administered 2015-03-12: 500 mL via INTRAVENOUS

## 2015-03-12 MED ORDER — SODIUM CHLORIDE 0.9 % IV SOLN: 1000.0000 mL | INTRAVENOUS | Status: AC

## 2015-03-12 MED ORDER — ALBUTEROL SULFATE (2.5 MG/3ML) 0.083% IN NEBU
2.5000 mg | INHALATION_SOLUTION | Freq: Four times a day (QID) | RESPIRATORY_TRACT | Status: DC | PRN
Start: 2015-03-12 — End: 2015-03-16
  Administered 2015-03-15: 2.5 mg via RESPIRATORY_TRACT
  Filled 2015-03-12: qty 3

## 2015-03-12 NOTE — Care Management Note (Signed)
Case Management Note  Patient Details  Name: GINNIE MARICH MRN: [id number] Date of Birth: 1928/04/15  Subjective/Objective:                 Confirmed pna   Action/Plan:Date: March 12, 2015 Chart reviewed for concurrent status and case management needs. Will continue to follow patient for changes and needs: Velva Harman, RN, BSN, [place]   [phone]   Expected Discharge Date:  03/14/15               Expected Discharge Plan:  Assisted Living / Rest Home  In-House Referral:  Clinical Social Work  Discharge planning Services  CM Consult  Post Acute Care Choice:  NA Choice offered to:  NA  DME Arranged:  N/A DME Agency:  NA  HH Arranged:  NA HH Agency:  NA  Status of Service:  In process, will continue to follow  Medicare Important Message Given:    Date Medicare IM Given:    Medicare IM give by:    Date Additional Medicare IM Given:    Additional Medicare Important Message give by:     If discussed at [place] of Stay Meetings, dates discussed:    Additional Comments:  Leeroy Cha, RN 03/12/2015, 10:28 AM

## 2015-03-12 NOTE — Progress Notes (Addendum)
Patient ID: Rebecca [name], female   DOB: 1927-10-03, 81 y.o.   MRN: [id number] [facility] PROGRESS NOTE  LARAMIE GELLES TMA:[id number] DOB: [birth year] DOA: 03/11/2015 PCP: Estill Dooms, MD  Brief narrative:    81 y.o. femaleresident of the [place] with past medical history of memory loss, depression, dyslipidemia, seizures who presneted to [facility] ED with fevers and worsening mental status changes, confusion started about 1 day prior to this admission. Pt was hypotensive on admission with BP of 80/64, HR 89, RR 26, T max 102.9 F and oxygen saturation of 89% on room air which has improved to 95% with [place] oxygen support. Blood work was notable for WBC count of 13, normal lactic acid and slight elevation in procalcitonin 0.31. CXR showed possible bilateral pneumonia. She was started on vanco and zosyn for treatment of HCAP.   Assessment/Plan:    Principal Problem:   Sepsis due to pneumonia ([place]) / Healthcare-associated pneumonia / Bilateral pneumonia / Leukocytosis - Sepsis criteria met on admission with fever, tachypnea, hypoxia, hypotension, leukocytosis and elevated procalcitonin level - Source of infection is bilateral pneumonia as seen on admission CXR - Sepsis work up initiated on admission - Started vanco and zosyn  - Influenza panel is pending - Legionella and strep pneumonia is pending  - Blood cultures are pending  - Respiratory culture is pending - Continue to monitor in SDU for next 24 hours   Active Problems:   Acute respiratory failure with hypoxia ([place]) - Likely secondary to to HCAP - Respiratory status stable - Continue duoneb TID scheduled and albuterol every 6 hours as needed for shortness of breath or wheezing - Continue oxygen support iva [place] to keep O2 sats above 90% - Continue abx for HCAP    Acute encephalopathy - Likely due to sepsis - Improved this am - Obtain PT evaluation     Hypothyroidism - Continue synthroid   Memory loss - Continue Aricept    Depression - Stable - Continue zoloft    Seizure disorder ([place]) - Continue dilantin - no reports of seizures    Dyslipidemia - Continue omega 3 supplementation   DVT Prophylaxis  - Lovenox subQ ordered   Code Status: DNR/DNI Family Communication:  plan of care discussed with the patient and her daughter Tressia Miners at the bedside  Disposition Plan: remain in SDU due to slight hypoxia, sepsis   IV access:  Peripheral IV  Procedures and diagnostic studies:    Dg Chest 2 View 03/11/2015 Patchy bilateral pulmonary infiltrates. Findings are consistent with pneumonia/pneumonitis.  Medical Consultants:  None   Other Consultants:  Physical therapy   IAnti-Infectives:   Vanco 03/11/2015 --> Zosyn 03/11/2015 -->   Leisa Lenz, MD  [facility] Pager [phone]  Time spent in minutes: 25 minutes  If 7PM-7AM, please contact night-coverage [email or web address] Password [email or web address] 03/12/2015, 8:18 AM   LOS: 1 day    HPI/Subjective: No acute overnight events. Patient reports feeling better this am.  Objective: Filed Vitals:   03/11/15 2133 03/12/15 0000 03/12/15 0052 03/12/15 0400  BP:  82/45  88/50  Pulse:  71  81  Temp: 98.7 F (37.1 C)  98.3 F (36.8 C) 100.4 F (38 C)  TempSrc: Oral  Oral Oral  Resp:  22  26  Height:      Weight:      SpO2:  94% 95% 94%    Intake/Output Summary (Last 24 hours) at 03/12/15 0818 Last data filed at 03/12/15 0600  Gross per 24 hour  Intake      0 ml  Output   1550 ml  Net  -1550 ml    Exam:   General:  Pt is alert, follows commands appropriately, not in acute distress  Cardiovascular: Regular rate and rhythm, S1/S2 appreciated   Respiratory: Clear to auscultation bilaterally, no wheezing, no crackles, no rhonchi  Abdomen: Soft, non tender, non distended, bowel sounds present  Extremities: No edema, pulses DP and PT palpable bilaterally  Neuro: Grossly nonfocal  Data Reviewed: Basic  Metabolic Panel:  Recent Labs Lab 03/08/15 03/11/15 1838  NA 142 135  K 4.5 4.3  CL  --  103  CO2  --  24  GLUCOSE  --  131*  BUN 21 22*  CREATININE 0.6 0.74  CALCIUM  --  8.6*   Liver Function Tests:  Recent Labs Lab 03/08/15 03/11/15 1838  AST 20 22  ALT 13 15  ALKPHOS 74 62  BILITOT  --  0.6  PROT  --  6.4*  ALBUMIN  --  3.3*   No results for input(s): LIPASE, AMYLASE in the last 168 hours. No results for input(s): AMMONIA in the last 168 hours. CBC:  Recent Labs Lab 03/08/15 03/11/15 1838  WBC 6.2 13.0*  NEUTROABS  --  11.8*  HGB 13.5 12.5  HCT 40 38.3  MCV  --  98.2  PLT 200 157   Cardiac Enzymes: No results for input(s): CKTOTAL, CKMB, CKMBINDEX, TROPONINI in the last 168 hours. BNP: Invalid input(s): POCBNP CBG: No results for input(s): GLUCAP in the last 168 hours.  No results found for this or any previous visit (from the past 240 hour(s)).   Scheduled Meds: . calcium-vitamin D  1 tablet Oral Daily  . cholecalciferol  5,000 Units Oral Daily  . donepezil  10 mg Oral Daily  . enoxaparin (LOVENOX) injection  30 mg Subcutaneous Q24H  . ipratropium-albuterol  3 mL Nebulization TID  . levothyroxine  100 mcg Oral QAC breakfast  . loratadine  10 mg Oral Daily  . multivitamin with minerals  1 tablet Oral Daily  . omega-3 acid ethyl esters  1 g Oral Daily  . phenytoin  100 mg Oral Daily  . phenytoin  200 mg Oral QHS  . piperacillin-tazobactam (ZOSYN)  IV  3.375 g Intravenous 3 times per day  . saccharomyces boulardii  250 mg Oral BID  . sertraline  25 mg Oral Daily  . vancomycin  500 mg Intravenous Q12H   Continuous Infusions: . sodium chloride 1,000 mL (03/11/15 1909)  . sodium chloride 100 mL/hr at 03/11/15 2352

## 2015-03-13 LAB — URINE CULTURE: CULTURE: NO GROWTH

## 2015-03-13 MED ORDER — ENOXAPARIN SODIUM 40 MG/0.4ML ~~LOC~~ SOLN
40.0000 mg | SUBCUTANEOUS | Status: DC
  Administered 2015-03-13 – 2015-03-19 (×7): 40 mg via SUBCUTANEOUS
  Filled 2015-03-13 (×7): qty 0.4

## 2015-03-13 NOTE — Progress Notes (Signed)
RT came to administer Duoneb treatment and patient was on room air w/ [place] in nose sating 83%. Patient to be returned to 2 L [place] post neb treatment.

## 2015-03-13 NOTE — Evaluation (Signed)
Physical Therapy Evaluation Patient Details Name: Rebecca [name] MRN: [id number] DOB: [birth year] Today's Date: 03/13/2015   History of Present Illness  Satori NAKSHATRA KLOSE is a 81 y.o. female resident of the [place] who was sent to the ED 10/23/16due to FEvers to 103.5 and confusion.she was  found to have hypoxia with O2 sats to 83% Bilateral pneumonia on Chest X-ray   Clinical Impression  Patient is very lethargic Unable to participate in Dc plan. Patient will benefit from PT to address problems listed  Below.   Follow Up Recommendations SNF;Supervision/Assistance - 24 hour-or return to the ALF    Equipment Recommendations  None recommended by PT    Recommendations for Other Services       Precautions / Restrictions Precautions Precautions: Fall Precaution Comments: monitor sats      Mobility  Bed Mobility Overal bed mobility: Needs Assistance Bed Mobility: Supine to Sit;Sit to Supine     Supine to sit: Mod assist Sit to supine: Mod assist   General bed mobility comments: patient is floppy almost , falling asleep.  Transfers Overall transfer level: Needs assistance   Transfers: Sit to/from Stand;Stand Pivot Transfers Sit to Stand: Mod assist;+2 safety/equipment Stand pivot transfers: Mod assist;+2 safety/equipment       General transfer comment: bear hug approach.  Ambulation/Gait                Stairs            Wheelchair Mobility    Modified Rankin (Stroke Patients Only)       Balance Overall balance assessment: Needs assistance Sitting-balance support: Bilateral upper extremity supported;Feet supported Sitting balance-Leahy Scale: Poor     Standing balance support: During functional activity;Bilateral upper extremity supported Standing balance-Leahy Scale: Poor                               Pertinent Vitals/Pain Pain Assessment: No/denies pain    Home Living Family/patient expects to be  discharged to:: Assisted living                      Prior Function                 Hand Dominance        Extremity/Trunk Assessment   Upper Extremity Assessment: Generalized weakness           Lower Extremity Assessment: Generalized weakness      Cervical / Trunk Assessment: Kyphotic  Communication      Cognition Arousal/Alertness: Lethargic   Overall Cognitive Status: No family/caregiver present to determine baseline cognitive functioning                      General Comments      Exercises        Assessment/Plan    PT Assessment Patient needs continued PT services  PT Diagnosis Difficulty walking;Acute pain   PT Problem List Decreased strength;Decreased activity tolerance;Decreased balance;Decreased mobility;Decreased knowledge of use of DME;Decreased safety awareness;Decreased knowledge of precautions;Decreased cognition  PT Treatment Interventions DME instruction;Gait training;Functional mobility training;Therapeutic activities;Therapeutic exercise;Patient/family education   PT Goals (Current goals can be found in the Care Plan section) Acute Rehab PT Goals Patient Stated Goal: none stated PT Goal Formulation: Patient unable to participate in goal setting Time For Goal Achievement: 03/27/15 Potential to Achieve Goals: Good    Frequency Min 3X/week  Barriers to discharge        Co-evaluation               End of Session   Activity Tolerance: Patient limited by lethargy Patient left: in bed;with call bell/phone within reach;with bed alarm set Nurse Communication: Mobility status         Time: 0370-9643 PT Time Calculation (min) (ACUTE ONLY): 22 min   Charges:   PT Evaluation $Initial PT Evaluation Tier I: 1 Procedure     PT G CodesMarcelino Freestone PT [phone]  03/13/2015, 5:21 PM

## 2015-03-13 NOTE — Care Management Important Message (Signed)
Important Message  Patient Details  Name: Rebecca [name] MRN: [id number] Date of Birth: 04/15/1928   Medicare Important Message Given:  Yes-second notification given    Camillo Flaming 03/13/2015, 12:10 [place] Message  Patient Details  Name: Rebecca [name] MRN: [id number] Date of Birth: October 20, 1927   Medicare Important Message Given:  Yes-second notification given    Camillo Flaming 03/13/2015, 12:10 PM

## 2015-03-13 NOTE — Progress Notes (Addendum)
Patient ID: Rebecca [name], female   DOB: 1927-11-11, 81 y.o.   MRN: [id number] [facility] PROGRESS NOTE  NIKKOL PAI EMV:[id number] DOB: Mar 25, 1928 DOA: 03/11/2015 PCP: Estill Dooms, MD  Brief narrative:    81 y.o. [place]resident of the [place] with past medical history of memory loss, depression, dyslipidemia, seizures who presneted to [name] ED with fevers and worsening mental status changes, confusion started about 1 day prior to this admission. Pt was hypotensive on admission with BP of 80/64, HR 89, RR 26, T max 102.9 F and oxygen saturation of 89% on room air which has improved to 95% with [place] oxygen support. Blood work was notable for WBC count of 13, normal lactic acid and slight elevation in procalcitonin 0.31. CXR showed possible bilateral pneumonia. She was started on vanco and zosyn for treatment of HCAP.  Transferred to medical floor 10/24.  Anticipated discharge: 10/26.  Assessment/Plan:    Principal Problem:   Sepsis due to pneumonia ([place]) / Healthcare-associated pneumonia / Bilateral pneumonia / Leukocytosis - Sepsis criteria met on admission with fever, tachypnea, hypoxia, hypotension, leukocytosis and elevated procalcitonin level. Source of infection thought to be pneumonia seen on CXR at the time of the admission  - Continue vanco and zosyn  - Influenza panel is negative  - Legionella and strep pneumonia is pending  - Blood cultures are pending  - Respiratory culture is pending - Stable respiratory status   Active Problems:   Acute respiratory failure with hypoxia (HCC) - Secondary to HCAP - Continue duoneb as needed for shortness of breath or wheezing  - Continue oxygen support iva [place] to keep O2 sats above 90% - Continue abx for HCAP    Acute encephalopathy - Likely due to sepsis - More confused this am - Will continue to monitor - PT eval pending     Hypothyroidism - We will continue synthroid    Memory loss - We  will continue Aricept    Depression - Continue zoloft    Seizure disorder (HCC) - Continue dilantin    Dyslipidemia - Continue omega 3 supplementation   DVT Prophylaxis  - Lovenox subQ ordered in hospital   Code Status: DNR/DNI Family Communication:  plan of care discussed with the patient and her daughter Tressia Miners at the bedside  Disposition Plan: D/C 10/26 if she feels better   IV access:  Peripheral IV  Procedures and diagnostic studies:    Dg Chest 2 View 03/11/2015 Patchy bilateral pulmonary infiltrates. Findings are consistent with pneumonia/pneumonitis.  Medical Consultants:  None   Other Consultants:  Physical therapy   IAnti-Infectives:   Vanco 03/11/2015 --> Zosyn 03/11/2015 -->   Leisa Lenz, MD  [facility] Pager [phone]  Time spent in minutes: 25 minutes  If 7PM-7AM, please contact night-coverage [email or web address] Password [email or web address] 03/13/2015, 12:19 PM   LOS: 2 days    HPI/Subjective: No acute overnight events. Patient more confused this am, calls some people names  Objective: Filed Vitals:   03/13/15 0500 03/13/15 0900 03/13/15 0924 03/13/15 1032  BP: 124/86 109/50    Pulse: 75 86 78   Temp: 98.4 F (36.9 C) 98.2 F (36.8 C)    TempSrc: Oral Oral    Resp: 20  20   Height:      Weight:      SpO2: 97% 90% 83% 93%    Intake/Output Summary (Last 24 hours) at 03/13/15 1219 Last data filed at 03/13/15 1024  Gross per 24 hour  Intake    960 ml  Output      0 ml  Net    960 ml    Exam:   General:  Pt seems confused, not in acute distress  Cardiovascular: Rate controlled, S1/S2 (+)  Respiratory: No wheezing, no crackles, no rhonchi  Abdomen: (+) BS, non tender   Extremities: No LE edema, palpable pulses   Neuro: Nonfocal  Data Reviewed: Basic Metabolic Panel:  Recent Labs Lab 03/08/15 03/11/15 1838 03/12/15 0334  NA 142 135 142  K 4.5 4.3 3.7  CL  --  103 109  CO2  --  24 26  GLUCOSE  --  131* 123*  BUN 21 22* 16   CREATININE 0.6 0.74 0.70  CALCIUM  --  8.6* 8.4*   Liver Function Tests:  Recent Labs Lab 03/08/15 03/11/15 1838  AST 20 22  ALT 13 15  ALKPHOS 74 62  BILITOT  --  0.6  PROT  --  6.4*  ALBUMIN  --  3.3*   No results for input(s): LIPASE, AMYLASE in the last 168 hours. No results for input(s): AMMONIA in the last 168 hours. CBC:  Recent Labs Lab 03/08/15 03/11/15 1838 03/12/15 0334  WBC 6.2 13.0* 11.0*  NEUTROABS  --  11.8*  --   HGB 13.5 12.5 12.6  HCT 40 38.3 39.0  MCV  --  98.2 100.5*  PLT 200 157 155   Cardiac Enzymes: No results for input(s): CKTOTAL, CKMB, CKMBINDEX, TROPONINI in the last 168 hours. BNP: Invalid input(s): POCBNP CBG: No results for input(s): GLUCAP in the last 168 hours.  Recent Results (from the past 240 hour(s))  Urine culture     Status: None   Collection Time: 03/11/15  7:26 PM  Result Value Ref Range Status   Specimen Description URINE, CATHETERIZED  Final   Special Requests NONE  Final   Culture   Final    NO GROWTH 1 DAY Performed at [facility]    Report Status 03/13/2015 FINAL  Final  MRSA PCR Screening     Status: None   Collection Time: 03/12/15  1:08 AM  Result Value Ref Range Status   MRSA by PCR NEGATIVE NEGATIVE Final     Scheduled Meds: . calcium-vitamin D  1 tablet Oral Daily  . cholecalciferol  5,000 Units Oral Daily  . donepezil  10 mg Oral Daily  . enoxaparin (LOVENOX) injection  40 mg Subcutaneous Q24H  . ipratropium-albuterol  3 mL Nebulization TID  . levothyroxine  100 mcg Oral QAC breakfast  . loratadine  10 mg Oral Daily  . multivitamin with minerals  1 tablet Oral Daily  . omega-3 acid ethyl esters  1 g Oral Daily  . phenytoin  100 mg Oral Daily  . phenytoin  200 mg Oral QHS  . piperacillin-tazobactam (ZOSYN)  IV  3.375 g Intravenous 3 times per day  . saccharomyces boulardii  250 mg Oral BID  . sertraline  25 mg Oral Daily  . vancomycin  500 mg Intravenous Q12H   Continuous Infusions: .  sodium chloride 100 mL/hr at 03/12/15 [phone]

## 2015-03-13 NOTE — Progress Notes (Signed)
[place] Progress Note Patient Name: Rebecca [name] DOB: 1928-02-02 MRN: [id number]   Date of Service  03/13/2015  HPI/Events of Note  Current need for beds at [facility] ICU She is stable, normal vital signs, normal labs, only on 2 L nasal cannula  eICU Interventions  Transfer to floor     Intervention Category Minor Interventions: Routine modifications to care plan (e.g. PRN medications for pain, fever)  Simonne Maffucci 03/13/2015, 2:25 AM

## 2015-03-14 LAB — VANCOMYCIN, TROUGH: Vancomycin Tr: 11 ug/mL (ref 10.0–20.0)

## 2015-03-14 MED ORDER — VANCOMYCIN HCL IN DEXTROSE 750-5 MG/150ML-% IV SOLN
750.0000 mg | Freq: Two times a day (BID) | INTRAVENOUS | Status: DC
  Administered 2015-03-14 – 2015-03-17 (×6): 750 mg via INTRAVENOUS
  Filled 2015-03-14 (×6): qty 150

## 2015-03-14 NOTE — Progress Notes (Signed)
ANTIBIOTIC CONSULT NOTE - Follow-up  Pharmacy Consult for Vancomycin, Zosyn Indication: rule out sepsis  Allergies  Allergen Reactions  . Morphine And Related Other (See Comments)    Altered mental state   . Sulfa Antibiotics Other (See Comments)    Reaction: unknown    Patient Measurements: Height: 5' (152.4 cm) Weight: 126 lb (57.153 kg) IBW/kg (Calculated) : 45.5  Vital Signs: Temp: 99.4 F (37.4 C) (10/26 0614) Temp Source: Oral (10/26 6314) BP: 116/58 mmHg (10/26 9702) Pulse Rate: 72 (10/26 0614) Intake/Output from previous day: 10/25 0701 - 10/26 0700 In: 4524.2 [P.O.:600; I.V.:3724.2; IV Piggyback:200] Out: 400 [Urine:400] Intake/Output from this shift:    Labs:  Recent Labs  03/11/15 1838 03/12/15 0334  WBC 13.0* 11.0*  HGB 12.5 12.6  PLT 157 155  CREATININE 0.74 0.70   Estimated Creatinine Clearance: 40.7 mL/min (by C-G formula based on Cr of 0.7).  Recent Labs  03/14/15 0645  [facility] 11     Microbiology: Recent Results (from the past 720 hour(s))  Blood Culture (routine x 2)     Status: None (Preliminary result)   Collection Time: 03/11/15  6:30 PM  Result Value Ref Range Status   Specimen Description RIGHT ANTECUBITAL  Final   Special Requests BOTTLES DRAWN AEROBIC AND ANAEROBIC 5CC  Final   Culture   Final    NO GROWTH 1 DAY Performed at [facility]    Report Status PENDING  Incomplete  Blood Culture (routine x 2)     Status: None (Preliminary result)   Collection Time: 03/11/15  6:35 PM  Result Value Ref Range Status   Specimen Description BLOOD RIGHT FOREARM  Final   Special Requests BOTTLES DRAWN AEROBIC AND ANAEROBIC 5CC  Final   Culture   Final    NO GROWTH 1 DAY Performed at [facility]    Report Status PENDING  Incomplete  Urine culture     Status: None   Collection Time: 03/11/15  7:26 PM  Result Value Ref Range Status   Specimen Description URINE, CATHETERIZED  Final   Special Requests NONE  Final   Culture   Final    NO GROWTH 1 DAY Performed at [facility]    Report Status 03/13/2015 FINAL  Final  MRSA PCR Screening     Status: None   Collection Time: 03/12/15  1:08 AM  Result Value Ref Range Status   MRSA by PCR NEGATIVE NEGATIVE Final    Comment:        The GeneXpert MRSA Assay (FDA approved for NASAL specimens only), is one component of a comprehensive MRSA colonization surveillance program. It is not intended to diagnose MRSA infection nor to guide or monitor treatment for MRSA infections.     Assessment: 51 y/oF with PMH of seizures, DJD, osteoporosis hypothyroidism, anemia currently being treated with Macrobid for UTI who presents to [facility] ED fom [facility] due to altered mental status and fever. CXR shows patchy bilateral pulmonary infiltrates; findings are consistent with pneumonia/pneumonitis. UA with cloudy urine, many bacteria, large leukocytes, nitrite positive. Pharmacy consulted to assist with dosing of Vancomycin and Zosyn..   PTA Macrobid  10/23 >> Vancomycin >>  10/23 >> Zosyn >>   10/23 blood x 2: NGTD 10/23 urine: NGD 10/24 MRSA negative  10/24 Flu PCR: neg Strep/legionella Ag: not collected Sputum: not collected   Dose changes/levels:  Vancomycin trough 10/26 0645 = 73mg/ml on vanco '500mg'$  IV q12h (prior to 6th dose)  Afebrile WBC slightly elevated SCr WNL/stable  Goal of Therapy:  Vancomycin trough level 15-20 mcg/ml  Appropriate antibiotic dosing for renal function and indication Eradication of infection  Plan:  Day #3 antibiotics  Based on low trough, increase Vancomycin '750mg'$  IV q12h.  Zosyn 3.375g IV x 1 over 30 min ordered in ED. Continue with Zosyn 3.375g IV q8h (infuse over 4 hours).  Follow-up antibiotic de-escalation  Doreene Eland, PharmD, BCPS.   Pager: [phone] 03/14/2015 8:55 AM

## 2015-03-14 NOTE — Progress Notes (Signed)
Patient ID: [name], female   DOB: [birth year], 81 y.o.   MRN: [id number] [facility] PROGRESS NOTE  DANNAE KATO GYF:[id number] DOB: 05-May-1928 DOA: 03/11/2015 PCP: Estill Dooms, MD  Brief narrative:    81 y.o. femaleresident of the [place] with past medical history of memory loss, depression, dyslipidemia, seizures who presneted to [facility] ED with fevers and worsening mental status changes, confusion started about 1 day prior to this admission. Pt was hypotensive on admission with BP of 80/64, HR 89, RR 26, T max 102.9 F and oxygen saturation of 89% on room air which has improved to 95% with [place] oxygen support. Blood work was notable for WBC count of 13, normal lactic acid and slight elevation in procalcitonin 0.31. CXR showed possible bilateral pneumonia. She was started on vanco and zosyn for treatment of HCAP.  Transferred to medical floor 10/24.  Anticipated discharge: to SNF 10/26.  Assessment/Plan:    Principal Problem:   Sepsis due to pneumonia ([place]) / Healthcare-associated pneumonia / Bilateral pneumonia / Leukocytosis - Please note that sepsis criteria met on admission with fever, tachypnea, hypoxia, hypotension, leukocytosis and elevated procalcitonin level. Source of infection thought to be pneumonia seen on CXR  - Continue vanco and zosyn through today and change to PO abx prior to discharge tomorrow - Influenza panel is negative  - Legionella and strep pneumonia for some reason not collected on admission - Blood cultures show no growth so far - Respiratory culture is pending  Active Problems:   Acute respiratory failure with hypoxia (HCC) - Secondary to HCAP - Stable respiratory status  - Continue oxygen support via [place] to keep O2 sats above 90%    Acute encephalopathy - Secondary to sepsis - Better mental status this am - Per PT - return to SNF when stable     Hypothyroidism - Continue synthroid    Memory loss - Continue  Aricept    Depression - Stable - Continue zoloft    Seizure disorder ([place]) - We will continue dilantin    Dyslipidemia - We will continue omega 3 supplementation   DVT Prophylaxis  - Lovenox subQ   Code Status: DNR/DNI Family Communication:  plan of care discussed with the patient and her daughter Tressia Miners at the bedside  Disposition Plan: D/C 10/26 to SNF  IV access:  Peripheral IV  Procedures and diagnostic studies:    Dg Chest 2 View 03/11/2015 Patchy bilateral pulmonary infiltrates. Findings are consistent with pneumonia/pneumonitis.  Medical Consultants:  None   Other Consultants:  Physical therapy   IAnti-Infectives:   Vanco 03/11/2015 --> Zosyn 03/11/2015 -->   Leisa Lenz, MD  [facility] Pager [phone]  Time spent in minutes: 15 minutes  If 7PM-7AM, please contact night-coverage [email or web address] Password [email or web address] 03/14/2015, 12:45 PM   LOS: 3 days    HPI/Subjective: No acute overnight events. Patient says she feels better this am.  Objective: Filed Vitals:   03/13/15 2148 03/14/15 0614 03/14/15 0829 03/14/15 1100  BP: 125/66 116/58  127/61  Pulse: 80 72  97  Temp: 98.9 F (37.2 C) 99.4 F (37.4 C)  100 F (37.8 C)  TempSrc: Oral Oral  Oral  Resp: '19 20  31  ' Height:      Weight:      SpO2: 97% 93% 92% 97%    Intake/Output Summary (Last 24 hours) at 03/14/15 1245 Last data filed at 03/14/15 1245  Gross per 24 hour  Intake 4284.16 ml  Output    400 ml  Net 3884.16 ml    Exam:   General:  Pt is not in acute distress  Cardiovascular: RRR, S1/S2 appreciated   Respiratory: bilateral air entry, coughing with deep inspiration  Abdomen: non tender, non distended, (+) BS  Extremities: No leg swelling, pulses palpable   Neuro: No focal deficits   Data Reviewed: Basic Metabolic Panel:  Recent Labs Lab 03/08/15 03/11/15 1838 03/12/15 0334  NA 142 135 142  K 4.5 4.3 3.7  CL  --  103 109  CO2  --  24 26  GLUCOSE  --  131*  123*  BUN 21 22* 16  CREATININE 0.6 0.74 0.70  CALCIUM  --  8.6* 8.4*   Liver Function Tests:  Recent Labs Lab 03/08/15 03/11/15 1838  AST 20 22  ALT 13 15  ALKPHOS 74 62  BILITOT  --  0.6  PROT  --  6.4*  ALBUMIN  --  3.3*   No results for input(s): LIPASE, AMYLASE in the last 168 hours. No results for input(s): AMMONIA in the last 168 hours. CBC:  Recent Labs Lab 03/08/15 03/11/15 1838 03/12/15 0334  WBC 6.2 13.0* 11.0*  NEUTROABS  --  11.8*  --   HGB 13.5 12.5 12.6  HCT 40 38.3 39.0  MCV  --  98.2 100.5*  PLT 200 157 155   Cardiac Enzymes: No results for input(s): CKTOTAL, CKMB, CKMBINDEX, TROPONINI in the last 168 hours. BNP: Invalid input(s): POCBNP CBG: No results for input(s): GLUCAP in the last 168 hours.  Recent Results (from the past 240 hour(s))  Urine culture     Status: None   Collection Time: 03/11/15  7:26 PM  Result Value Ref Range Status   Specimen Description URINE, CATHETERIZED  Final   Special Requests NONE  Final   Culture   Final    NO GROWTH 1 DAY Performed at [facility]    Report Status 03/13/2015 FINAL  Final  MRSA PCR Screening     Status: None   Collection Time: 03/12/15  1:08 AM  Result Value Ref Range Status   MRSA by PCR NEGATIVE NEGATIVE Final     Scheduled Meds: . calcium-vitamin D  1 tablet Oral Daily  . cholecalciferol  5,000 Units Oral Daily  . donepezil  10 mg Oral Daily  . enoxaparin (LOVENOX) injection  40 mg Subcutaneous Q24H  . ipratropium-albuterol  3 mL Nebulization TID  . levothyroxine  100 mcg Oral QAC breakfast  . loratadine  10 mg Oral Daily  . multivitamin with minerals  1 tablet Oral Daily  . omega-3 acid ethyl esters  1 g Oral Daily  . phenytoin  100 mg Oral Daily  . phenytoin  200 mg Oral QHS  . piperacillin-tazobactam (ZOSYN)  IV  3.375 g Intravenous 3 times per day  . saccharomyces boulardii  250 mg Oral BID  . sertraline  25 mg Oral Daily  . vancomycin  750 mg Intravenous Q12H    Continuous Infusions: . sodium chloride 10 mL/hr at 03/14/15 1029

## 2015-03-14 NOTE — Progress Notes (Signed)
CSW continuing to follow.   CSW met with pt and pt son, [name] at length at bedside to discuss disposition planning.   Pt expressed earlier in the day that she wanted to return to ALF at [facility]. PT eval completed 10/25 indicated pt requiring +2 assist, but RN reports pt is requiring less assistance today. OT eval placed in order to evaluate need.   CSW discussed with pt and pt son that given pt needs from PT evaluation yesterday that [facility] did not feel that pt would be safe in ALF and recommended for pt to go to rehab at [facility] [place] explained to pt and pt son that PT/OT are requested to come back tomorrow and that if pt does significantly better then there is a chance pt may be able to return to ALF, but likely [place] will want pt to go to rehab for a few days before going back to the ALF to ensure that pt is stronger. Pt son states that they would be agreeable with the rehab section if that is the recommendation from [facility]. Pt son reports that depending on how pt does with PT, if pt is strong enough to transport via private vehicle then pt son or pt daughter can transport pt. Pt son would like to be updated via telephone tomorrow morning following therapy sessions regarding plan, anticipated d/c time, and if pt will be able to transport via private vehicle because pt daughter will likely be the one to transport and she would be coming from [place].   CSW has confirmed with [place] that facility has Medicare certified bed available for pt and unless pt does significantly better with PT/OT that [facility] would like pt to come to rehab prior to returning to ALF.   CSW to follow up on PT/OT recommendations tomorrow morning and assist with pt discharge to [facility] tomorrow afternoon.   CSW to continue to follow.   Alison Murray, MSW, LCSW Clinical Social Work Coverage for [employer] [phone]

## 2015-03-14 NOTE — Clinical Social Work Note (Signed)
Clinical Social Work Assessment  Patient Details  Name: Rebecca [name] MRN: [id number] Date of Birth: 08/25/1927  Date of referral:  03/14/15               Reason for consult:  Facility Placement                Permission sought to share information with:  Family Supports Permission granted to share information::  Yes, Verbal Permission Granted  Name::        Agency::     Relationship::     Contact Information:     Housing/Transportation Living arrangements for the past 2 months:  [place] of Information:  Patient Patient Interpreter Needed:  None Criminal Activity/Legal Involvement Pertinent to Current Situation/Hospitalization:  No - Comment as needed Significant Relationships:  Adult Children Lives with:  Self Do you feel safe going back to the place where you live?  Yes Need for family participation in patient care:  Yes (Comment)  Care giving concerns:  Admitted from [place] Worker assessment / plan:  Patient wants to return to ALF but may need SNF level at dc.   Employment status:  Retired Forensic scientist:  Medicare PT Recommendations:  [place] / Referral to community resources:  [place]  Patient/Family's Response to care: TBD  Patient/Family's Understanding of and Emotional Response to Diagnosis, Current Treatment, and Prognosis:  TBD  Emotional Assessment Appearance:  Developmentally appropriate Attitude/Demeanor/Rapport:    Affect (typically observed):  Accepting Orientation:  Oriented to Self, Oriented to Place, Oriented to  Time, Oriented to Situation Alcohol / Substance use:  Never Used Psych involvement (Current and /or in the community):  No (Comment)  Discharge Needs  Concerns to be addressed:  Discharge Planning Concerns Readmission within the last 30 days:  No Current discharge risk:  None Barriers to Discharge:  No Barriers Identified   Ludwig Clarks, LCSW 03/14/2015, 12:08 PM

## 2015-03-14 NOTE — NC FL2 (Deleted)
[place] MEDICAID FL2 LEVEL OF CARE SCREENING TOOL     IDENTIFICATION  Patient Name: Rebecca [name] Birthdate: Nov 14, 1927 Sex: female Admission Date (Current Location): 03/11/2015  [facility] and [place] Number: Herbalist and Address:  [facility],  [place] [address], [place]      Provider Number: [id number]  Attending Physician Name and Address:  Robbie Lis, MD  Relative Name and Phone Number:       Current Level of Care: Hospital Recommended Level of Care: [place], [place] Prior Approval Number:    Date Approved/Denied:   PASRR Number:    Discharge Plan: Other (Comment) (ALF)    Current Diagnoses: Patient Active Problem List   Diagnosis Date Noted  . Acute respiratory failure with hypoxia ([place]) 03/12/2015  . Dyslipidemia 03/12/2015  . Sepsis due to pneumonia ([place]) 03/12/2015  . Acute encephalopathy 03/12/2015  . Healthcare-associated pneumonia 03/11/2015  . Leukocytosis 03/11/2015  . Seizure disorder ([place]) 03/11/2015  . Depression 09/26/2014  . Memory loss 09/19/2012  . Hypothyroidism 12/08/2006    Orientation ACTIVITIES/SOCIAL BLADDER RESPIRATION  Self Active Continent O2 (As needed) (2 l continuous)  BEHAVIORAL SYMPTOMS/MOOD NEUROLOGICAL BOWEL NUTRITION STATUS      Continent    PHYSICIAN VISITS COMMUNICATION OF NEEDS Height & Weight Skin    Verbally 5' (152.4 cm) 126 lbs. Normal          AMBULATORY STATUS RESPIRATION    Supervision limited O2 (As needed) (2 l continuous)      Personal Care Assistance Level of Assistance               Functional Limitations Info                SPECIAL CARE FACTORS FREQUENCY  PT (By licensed PT)     PT Frequency: 5             Additional Factors Info  Code Status, Allergies Code Status Info: DNR Allergies Info: Morphine Related sulfa ABX           Current Medications (03/14/2015): Current Facility-Administered Medications   Medication Dose Route Frequency Provider Last Rate Last Dose  . 0.9 %  sodium chloride infusion   Intravenous Continuous Robbie Lis, MD 10 mL/hr at 03/13/15 1231 10 mL/hr at 03/13/15 1231  . acetaminophen (TYLENOL) tablet 650 mg  650 mg Oral Q6H PRN Theressa Millard, MD   650 mg at 03/13/15 2127   Or  . acetaminophen (TYLENOL) suppository 650 mg  650 mg Rectal Q6H PRN Theressa Millard, MD      . albuterol (PROVENTIL) (2.5 MG/3ML) 0.083% nebulizer solution 2.5 mg  2.5 mg Nebulization Q6H PRN Theressa Millard, MD      . alum & mag hydroxide-simeth (MAALOX/MYLANTA) 200-200-20 MG/5ML suspension 30 mL  30 mL Oral Q6H PRN Theressa Millard, MD      . calcium-vitamin D (OSCAL WITH D) 500-200 MG-UNIT per tablet 1 tablet  1 tablet Oral Daily Theressa Millard, MD   1 tablet at 03/13/15 1008  . cholecalciferol (VITAMIN D) tablet 5,000 Units  5,000 Units Oral Daily Theressa Millard, MD   5,000 Units at 03/13/15 1009  . clonazePAM (KLONOPIN) tablet 0.5 mg  0.5 mg Oral BID PRN Theressa Millard, MD   0.5 mg at 03/13/15 0015  . donepezil (ARICEPT) tablet 10 mg  10 mg Oral Daily Theressa Millard, MD   10 mg at 03/13/15 1008  .  enoxaparin (LOVENOX) injection 40 mg  40 mg Subcutaneous Q24H Adrian Saran, RPH   40 mg at 03/13/15 1011  . HYDROmorphone (DILAUDID) injection 0.5-1 mg  0.5-1 mg Intravenous Q3H PRN Theressa Millard, MD      . ipratropium-albuterol (DUONEB) 0.5-2.5 (3) MG/3ML nebulizer solution 3 mL  3 mL Nebulization TID Theressa Millard, MD   3 mL at 03/14/15 0828  . levothyroxine (SYNTHROID, LEVOTHROID) tablet 100 mcg  100 mcg Oral QAC breakfast Theressa Millard, MD   100 mcg at 03/14/15 0743  . loratadine (CLARITIN) tablet 10 mg  10 mg Oral Daily Theressa Millard, MD   10 mg at 03/13/15 1008  . multivitamin with minerals tablet 1 tablet  1 tablet Oral Daily Theressa Millard, MD   1 tablet at 03/13/15 1008  . omega-3 acid ethyl esters (LOVAZA) capsule 1 g  1 g Oral Daily  Theressa Millard, MD   1 g at 03/13/15 1007  . ondansetron (ZOFRAN) tablet 4 mg  4 mg Oral Q6H PRN Theressa Millard, MD       Or  . ondansetron (ZOFRAN) injection 4 mg  4 mg Intravenous Q6H PRN Theressa Millard, MD      . oxyCODONE (Oxy IR/ROXICODONE) immediate release tablet 5 mg  5 mg Oral Q4H PRN Theressa Millard, MD      . phenytoin (DILANTIN) ER capsule 100 mg  100 mg Oral Daily Theressa Millard, MD   100 mg at 03/13/15 1008  . phenytoin (DILANTIN) ER capsule 200 mg  200 mg Oral QHS Theressa Millard, MD   200 mg at 03/13/15 2112  . piperacillin-tazobactam (ZOSYN) IVPB 3.375 g  3.375 g Intravenous 3 times per day Ezequiel Essex, MD   3.375 g at 03/14/15 0130  . saccharomyces boulardii (FLORASTOR) capsule 250 mg  250 mg Oral BID Theressa Millard, MD   250 mg at 03/13/15 2112  . sertraline (ZOLOFT) tablet 25 mg  25 mg Oral Daily Theressa Millard, MD   25 mg at 03/13/15 1019  . vancomycin (VANCOCIN) IVPB 750 mg/150 ml premix  750 mg Intravenous Q12H Berton Mount, RPH       Do not use this list as official medication orders. Please verify with discharge summary.  Discharge Medications:   Medication List    ASK your doctor about these medications        acetaminophen 650 MG CR tablet  Commonly known as:  TYLENOL  Take 650 mg by mouth daily as needed for pain.     amoxicillin 500 MG capsule  Commonly known as:  AMOXIL  Take 4 capsules one hour prior to dental appointment     CALCIUM 600+D 600-400 MG-UNIT tablet  Generic drug:  Calcium Carbonate-Vitamin D  Take 1 tablet by mouth daily.     clonazePAM 0.5 MG tablet  Commonly known as:  KLONOPIN  Take 1 tablet (0.5 mg total) by mouth 2 (two) times daily as needed for anxiety.     diphenhydramine-acetaminophen 25-500 MG Tabs tablet  Commonly known as:  TYLENOL PM  Take 1 tablet by mouth at bedtime as needed (sleep).     donepezil 10 MG tablet  Commonly known as:  ARICEPT  Take 10 mg by mouth daily.      donepezil 5 MG tablet  Commonly known as:  ARICEPT  TAKE 1 TAB BY MOUTH EVERY MORNING FOR 28 DAYS (4 WEEKS) THEN INCREASE TO '10MG'$   KLS ALLER-TEC 10 MG tablet  Generic drug:  cetirizine  Take 10 mg by mouth daily as needed for allergies.     levothyroxine 100 MCG tablet  Commonly known as:  SYNTHROID, LEVOTHROID  Take 1 tablet (100 mcg total) by mouth daily before breakfast. For thyroid     meloxicam 15 MG tablet  Commonly known as:  MOBIC  TAKE 1 TABLET BY MOUTH ONCE DAILY TO HELP ARTHRITIS     MULTIVITAMINS PO  Take 1 tablet by mouth daily.     nitrofurantoin (macrocrystal-monohydrate) 100 MG capsule  Commonly known as:  MACROBID  Take 100 mg by mouth 2 (two) times daily.     omega-3 acid ethyl esters 1 G capsule  Commonly known as:  LOVAZA  Take 1 g by mouth daily. Fatty Acid one daily     ondansetron 4 MG tablet  Commonly known as:  ZOFRAN  Take 1 tablet (4 mg total) by mouth every 6 (six) hours as needed for nausea.     phenytoin 100 MG ER capsule  Commonly known as:  DILANTIN  Take 1 capsule by mouth in  the morning and 2 capsules  by mouth at bedtime to prevent seizure.     saccharomyces boulardii 250 MG capsule  Commonly known as:  FLORASTOR  Take 250 mg by mouth 2 (two) times daily.     sertraline 50 MG tablet  Commonly known as:  ZOLOFT  One daily to improve anxiety and depression     traMADol 50 MG tablet  Commonly known as:  ULTRAM  Take 1 tablet (50 mg total) by mouth every 6 (six) hours as needed for moderate pain or severe pain.     Vitamin D-3 5000 UNITS Tabs  Take 5,000 Units by mouth daily.        Relevant Imaging Results:  Relevant Lab Results:  Recent Labs    Additional Information    Ludwig Clarks, LCSW

## 2015-03-14 NOTE — NC FL2 (Deleted)
[place] MEDICAID FL2 LEVEL OF CARE SCREENING TOOL     IDENTIFICATION  Patient Name: Rebecca [name] Birthdate: 04-Aug-1927 Sex: female Admission Date (Current Location): 03/11/2015  [facility] and [place] Number: Herbalist and Address:  [place],  [place] [address] [place]      Provider Number: [id number]  Attending Physician Name and Address:  Robbie Lis, MD  Relative Name and Phone Number:       Current Level of Care: Hospital Recommended Level of Care: [place], [place] Prior Approval Number:    Date Approved/Denied:   PASRR Number:   [id number]   Discharge Plan: Other (Comment) (ALF)    Current Diagnoses: Patient Active Problem List   Diagnosis Date Noted  . Acute respiratory failure with hypoxia ([place]) 03/12/2015  . Dyslipidemia 03/12/2015  . Sepsis due to pneumonia ([place]) 03/12/2015  . Acute encephalopathy 03/12/2015  . Healthcare-associated pneumonia 03/11/2015  . Leukocytosis 03/11/2015  . Seizure disorder ([place]) 03/11/2015  . Depression 09/26/2014  . Memory loss 09/19/2012  . Hypothyroidism 12/08/2006    Orientation ACTIVITIES/SOCIAL BLADDER RESPIRATION  Self Active Continent O2 (As needed) (2 l continuous)  BEHAVIORAL SYMPTOMS/MOOD NEUROLOGICAL BOWEL NUTRITION STATUS      Continent    PHYSICIAN VISITS COMMUNICATION OF NEEDS Height & Weight Skin    Verbally 5' (152.4 cm) 126 lbs. Normal          AMBULATORY STATUS RESPIRATION    Supervision limited O2 (As needed) (2 l continuous)      Personal Care Assistance Level of Assistance               Functional Limitations Info                SPECIAL CARE FACTORS FREQUENCY  PT (By licensed PT)     PT Frequency: 5             Additional Factors Info  Code Status, Allergies Code Status Info: DNR Allergies Info: Morphine Related sulfa ABX           Current Medications (03/14/2015): Current  Facility-Administered Medications  Medication Dose Route Frequency Provider Last Rate Last Dose  . 0.9 %  sodium chloride infusion   Intravenous Continuous Robbie Lis, MD 10 mL/hr at 03/14/15 1029    . acetaminophen (TYLENOL) tablet 650 mg  650 mg Oral Q6H PRN Theressa Millard, MD   650 mg at 03/13/15 2127   Or  . acetaminophen (TYLENOL) suppository 650 mg  650 mg Rectal Q6H PRN Theressa Millard, MD      . albuterol (PROVENTIL) (2.5 MG/3ML) 0.083% nebulizer solution 2.5 mg  2.5 mg Nebulization Q6H PRN Theressa Millard, MD      . alum & mag hydroxide-simeth (MAALOX/MYLANTA) 200-200-20 MG/5ML suspension 30 mL  30 mL Oral Q6H PRN Theressa Millard, MD      . calcium-vitamin D (OSCAL WITH D) 500-200 MG-UNIT per tablet 1 tablet  1 tablet Oral Daily Theressa Millard, MD   1 tablet at 03/14/15 1020  . cholecalciferol (VITAMIN D) tablet 5,000 Units  5,000 Units Oral Daily Theressa Millard, MD   5,000 Units at 03/14/15 1021  . clonazePAM (KLONOPIN) tablet 0.5 mg  0.5 mg Oral BID PRN Theressa Millard, MD   0.5 mg at 03/13/15 0015  . donepezil (ARICEPT) tablet 10 mg  10 mg Oral Daily Theressa Millard, MD   10 mg at 03/14/15 1020  .  enoxaparin (LOVENOX) injection 40 mg  40 mg Subcutaneous Q24H Adrian Saran, RPH   40 mg at 03/14/15 1027  . HYDROmorphone (DILAUDID) injection 0.5-1 mg  0.5-1 mg Intravenous Q3H PRN Theressa Millard, MD      . ipratropium-albuterol (DUONEB) 0.5-2.5 (3) MG/3ML nebulizer solution 3 mL  3 mL Nebulization TID Theressa Millard, MD   3 mL at 03/14/15 0828  . levothyroxine (SYNTHROID, LEVOTHROID) tablet 100 mcg  100 mcg Oral QAC breakfast Theressa Millard, MD   100 mcg at 03/14/15 0743  . loratadine (CLARITIN) tablet 10 mg  10 mg Oral Daily Theressa Millard, MD   10 mg at 03/14/15 1020  . multivitamin with minerals tablet 1 tablet  1 tablet Oral Daily Theressa Millard, MD   1 tablet at 03/14/15 1020  . omega-3 acid ethyl esters (LOVAZA) capsule 1 g  1 g Oral  Daily Theressa Millard, MD   1 g at 03/14/15 1019  . ondansetron (ZOFRAN) tablet 4 mg  4 mg Oral Q6H PRN Theressa Millard, MD       Or  . ondansetron (ZOFRAN) injection 4 mg  4 mg Intravenous Q6H PRN Theressa Millard, MD      . oxyCODONE (Oxy IR/ROXICODONE) immediate release tablet 5 mg  5 mg Oral Q4H PRN Theressa Millard, MD      . phenytoin (DILANTIN) ER capsule 100 mg  100 mg Oral Daily Theressa Millard, MD   100 mg at 03/14/15 1021  . phenytoin (DILANTIN) ER capsule 200 mg  200 mg Oral QHS Theressa Millard, MD   200 mg at 03/13/15 2112  . piperacillin-tazobactam (ZOSYN) IVPB 3.375 g  3.375 g Intravenous 3 times per day Ezequiel Essex, MD   3.375 g at 03/14/15 1029  . saccharomyces boulardii (FLORASTOR) capsule 250 mg  250 mg Oral BID Theressa Millard, MD   250 mg at 03/14/15 1019  . sertraline (ZOLOFT) tablet 25 mg  25 mg Oral Daily Theressa Millard, MD   25 mg at 03/14/15 1020  . vancomycin (VANCOCIN) IVPB 750 mg/150 ml premix  750 mg Intravenous Q12H Berton Mount, RPH       Do not use this list as official medication orders. Please verify with discharge summary.  Discharge Medications:   Medication List    ASK your doctor about these medications        acetaminophen 650 MG CR tablet  Commonly known as:  TYLENOL  Take 650 mg by mouth daily as needed for pain.     amoxicillin 500 MG capsule  Commonly known as:  AMOXIL  Take 4 capsules one hour prior to dental appointment     CALCIUM 600+D 600-400 MG-UNIT tablet  Generic drug:  Calcium Carbonate-Vitamin D  Take 1 tablet by mouth daily.     clonazePAM 0.5 MG tablet  Commonly known as:  KLONOPIN  Take 1 tablet (0.5 mg total) by mouth 2 (two) times daily as needed for anxiety.     diphenhydramine-acetaminophen 25-500 MG Tabs tablet  Commonly known as:  TYLENOL PM  Take 1 tablet by mouth at bedtime as needed (sleep).     donepezil 10 MG tablet  Commonly known as:  ARICEPT  Take 10 mg by mouth daily.      donepezil 5 MG tablet  Commonly known as:  ARICEPT  TAKE 1 TAB BY MOUTH EVERY MORNING FOR 28 DAYS (4 WEEKS) THEN INCREASE TO '10MG'$   KLS ALLER-TEC 10 MG tablet  Generic drug:  cetirizine  Take 10 mg by mouth daily as needed for allergies.     levothyroxine 100 MCG tablet  Commonly known as:  SYNTHROID, LEVOTHROID  Take 1 tablet (100 mcg total) by mouth daily before breakfast. For thyroid     meloxicam 15 MG tablet  Commonly known as:  MOBIC  TAKE 1 TABLET BY MOUTH ONCE DAILY TO HELP ARTHRITIS     MULTIVITAMINS PO  Take 1 tablet by mouth daily.     nitrofurantoin (macrocrystal-monohydrate) 100 MG capsule  Commonly known as:  MACROBID  Take 100 mg by mouth 2 (two) times daily.     omega-3 acid ethyl esters 1 G capsule  Commonly known as:  LOVAZA  Take 1 g by mouth daily. Fatty Acid one daily     ondansetron 4 MG tablet  Commonly known as:  ZOFRAN  Take 1 tablet (4 mg total) by mouth every 6 (six) hours as needed for nausea.     phenytoin 100 MG ER capsule  Commonly known as:  DILANTIN  Take 1 capsule by mouth in  the morning and 2 capsules  by mouth at bedtime to prevent seizure.     saccharomyces boulardii 250 MG capsule  Commonly known as:  FLORASTOR  Take 250 mg by mouth 2 (two) times daily.     sertraline 50 MG tablet  Commonly known as:  ZOLOFT  One daily to improve anxiety and depression     traMADol 50 MG tablet  Commonly known as:  ULTRAM  Take 1 tablet (50 mg total) by mouth every 6 (six) hours as needed for moderate pain or severe pain.     Vitamin D-3 5000 UNITS Tabs  Take 5,000 Units by mouth daily.        Relevant Imaging Results:  Relevant Lab Results:  Recent Labs    Additional Information    Ludwig Clarks, LCSW

## 2015-03-14 NOTE — Progress Notes (Signed)
Patient admitted from [facility]- she wants to return to ALF but may need SNF- have requested an OT eval and will discuss disposition with her further-  Eduard Clos, MSW, [place]

## 2015-03-15 LAB — CBC
HEMATOCRIT: 32.4 % — AB (ref 36.0–46.0)
Hemoglobin: 10.8 g/dL — ABNORMAL LOW (ref 12.0–15.0)
MCH: 32.6 pg (ref 26.0–34.0)
MCHC: 33.3 g/dL (ref 30.0–36.0)
MCV: 97.9 fL (ref 78.0–100.0)
PLATELETS: 184 10*3/uL (ref 150–400)
RBC: 3.31 MIL/uL — AB (ref 3.87–5.11)
RDW: 13.4 % (ref 11.5–15.5)
WBC: 9.1 10*3/uL (ref 4.0–10.5)

## 2015-03-15 LAB — BASIC METABOLIC PANEL
Anion gap: 7 (ref 5–15)
BUN: 11 mg/dL (ref 6–20)
CHLORIDE: 104 mmol/L (ref 101–111)
CO2: 26 mmol/L (ref 22–32)
CREATININE: 0.58 mg/dL (ref 0.44–1.00)
Calcium: 8.2 mg/dL — ABNORMAL LOW (ref 8.9–10.3)
GFR calc non Af Amer: >60 mL/min (ref >60)
Glucose, Bld: 154 mg/dL — ABNORMAL HIGH (ref 65–99)
POTASSIUM: 3.4 mmol/L — AB (ref 3.5–5.1)
SODIUM: 137 mmol/L (ref 135–145)

## 2015-03-15 NOTE — Evaluation (Signed)
Occupational Therapy Evaluation Patient Details Name: Rebecca [name] MRN: [id number] DOB: 1928/01/17 Today's Date: 03/15/2015    History of Present Illness Rebecca [name] is a 81 y.o. female resident of the [facility] who was sent to the ED 10/23/16due to FEvers to 103.5 and confusion.she was  found to have hypoxia with O2 sats to 83% Bilateral pneumonia on Chest X-ray    Clinical Impression   Pt admitted with fevers and confusion. Pt currently with functional limitations due to the deficits listed below (see OT Problem List).  Pt will benefit from skilled OT to increase their safety and independence with ADL and functional mobility for ADL to facilitate discharge to venue listed below.      Follow Up Recommendations  SNF    Equipment Recommendations  None recommended by OT       Precautions / Restrictions Precautions Precautions: Fall Precaution Comments: monitor sats Restrictions Weight Bearing Restrictions: No      Mobility Bed Mobility   Bed Mobility: Supine to Sit     Supine to sit: Min assist     General bed mobility comments: pt in chair  Transfers Overall transfer level: Needs assistance Equipment used: Rolling walker (2 wheeled) Transfers: Sit to/from Stand Sit to Stand: Min assist Stand pivot transfers: Min assist       General transfer comment: multimodal cues for safety, to hold onto RW,  cues  repeated for patient to fiollow. ? component of HOH? Only took a few steps to the recliner. very fatigued after moving. To get another chest xray per Dr. Charlies Silvers and  can benefit from a breathing treatment so did not push ambulation. Sats at 85% upon entering the rooom on 2 liters [place]. sounds very congested  nasally. . RN ok'd to increase O2 to 3 l. sats 92%, desats to 81 on RA.     Balance Overall balance assessment: Needs assistance Sitting-balance support: No upper extremity supported;Feet supported Sitting balance-Leahy Scale:  Fair     Standing balance support: Bilateral upper extremity supported;During functional activity Standing balance-Leahy Scale: Fair                              ADL Overall ADL's : Needs assistance/impaired     Grooming: Wash/dry face;Sitting   Upper Body Bathing: Set up;Sitting   Lower Body Bathing: Maximal assistance;Sit to/from stand   Upper Body Dressing : Set up;Sitting   Lower Body Dressing: Sit to/from stand;Maximal assistance   Toilet Transfer: Moderate assistance;BSC   Toileting- Clothing Manipulation and Hygiene: Sit to/from stand;Maximal assistance                         Pertinent Vitals/Pain Pain Assessment: No/denies pain     Hand Dominance     Extremity/Trunk Assessment         Cervical / Trunk Assessment Cervical / Trunk Assessment: Kyphotic   Communication     Cognition Arousal/Alertness: Awake/alert Behavior During Therapy: WFL for tasks assessed/performed Overall Cognitive Status: Within Functional Limits for tasks assessed Area of Impairment: Orientation               General Comments: oriented to WL, at times refers to her things still in GB, then states"I am in GB".   General Comments               Home Living Family/patient expects to be discharged to::  Skilled nursing facility                                             OT Diagnosis: Generalized weakness   OT Problem List: Decreased strength;Decreased activity tolerance;Decreased safety awareness   OT Treatment/Interventions: Self-care/ADL training;DME and/or AE instruction;Patient/family education    OT Goals(Current goals can be found in the care plan section) Acute Rehab OT Goals Patient Stated Goal: none stated OT Goal Formulation: With patient Time For Goal Achievement: 03/29/15  OT Frequency: Min 2X/week   Barriers to D/C: Inaccessible home environment             End of Session Nurse Communication: Mobility  status  Activity Tolerance: Patient tolerated treatment well Patient left: in chair;with call bell/phone within reach;with chair alarm set   Time: 1020-1035 OT Time Calculation (min): 15 min Charges:  OT General Charges $OT Visit: 1 Procedure OT Evaluation $Initial OT Evaluation Tier I: 1 Procedure G-Codes:    Payton Mccallum D March 30, 2015, 11:06 AM

## 2015-03-15 NOTE — Progress Notes (Signed)
CSW updated patients son, [name], by phone- updated him as  To PT and OT progress today and their recommendations for SNF rehab at dc. [facility] indicates they do have a SNF bed for her to transition to at dc (Saturday? Per son).  CSW covering to finalize plans with family and SNF.   Eduard Clos, MSW, [place]

## 2015-03-15 NOTE — Progress Notes (Signed)
Pt presented with O2 sat 85%. No distress noted. HOB elevated. O2 turned up to 4L via nasal cannula. Respiratory notified for breathing treatment. O2 sat after treatment 97% an 4L. Will continue to monitor.

## 2015-03-15 NOTE — Progress Notes (Signed)
Physical Therapy Treatment Patient Details Name: Rebecca [name] MRN: [id number] DOB: 1927-06-21 Today's Date: 03/15/2015    History of Present Illness Rebecca [name] is a 81 y.o. female resident of the [facility] who was sent to the ED 10/23/16due to FEvers to 103.5 and confusion.she was  found to have hypoxia with O2 sats to 83% Bilateral pneumonia on Chest X-ray     PT Comments    Patient is much more alert today, remains SOB and desaturation on 2 l at rest= 85. Increased to 3 liters per RN, sats remain  Low 90's. On RA sats=81%. RN aware. Patient  Will benefit from SNF rehab unless progresses rapidly .   Follow Up Recommendations  SNF;Supervision/Assistance - 24 hour     Equipment Recommendations  None recommended by PT    Recommendations for Other Services       Precautions / Restrictions Precautions Precautions: Fall Precaution Comments: monitor sats Restrictions Weight Bearing Restrictions: No    Mobility  Bed Mobility   Bed Mobility: Supine to Sit     Supine to sit: Min assist     General bed mobility comments: improved to mobilize self, extra time, DOE, rest breaks. got to bed edge on L and R during treatment session. gets into bed without assistance  Transfers Overall transfer level: Needs assistance Equipment used: Rolling walker (2 wheeled) Transfers: Sit to/from [facility] Sit to Stand: Min assist Stand pivot transfers: Min assist       General transfer comment: multimodal cues for safety, to hold onto RW,  cues  repeated for patient to fiollow. ? component of HOH? Only took a few steps to the recliner. very fatigued after moving. To get another chest xray per Rebecca [name] and  can benefit from a breathing treatment so did not push ambulation. Sats at 85% upon entering the rooom on 2 liters [place]. sounds very congested  nasally. . RN ok'd to increase O2 to 3 l. sats 92%, desats to 81 on RA.   Ambulation/Gait                  Stairs            Wheelchair Mobility    Modified Rankin (Stroke Patients Only)       Balance Overall balance assessment: Needs assistance Sitting-balance support: No upper extremity supported;Feet supported Sitting balance-Leahy Scale: Fair     Standing balance support: Bilateral upper extremity supported;During functional activity Standing balance-Leahy Scale: Fair                      Cognition Arousal/Alertness: Awake/alert   Overall Cognitive Status: Within Functional Limits for tasks assessed Area of Impairment: Orientation               General Comments: oriented to WL, at times refers to her things still in GB, then states"I am in GB".    Exercises      General Comments        Pertinent Vitals/Pain Pain Assessment: No/denies pain    Home Living                      Prior Function            PT Goals (current goals can now be found in the care plan section) Progress towards PT goals: Progressing toward goals    Frequency  Min 3X/week    PT Plan Current plan  remains appropriate    Co-evaluation             End of Session   Activity Tolerance: Patient limited by fatigue;Treatment limited secondary to medical complications (Comment) (SOB) Patient left: in chair;with call bell/phone within reach;with chair alarm set;with nursing/sitter in room     Time: 1694-5038 PT Time Calculation (min) (ACUTE ONLY): 47 min  Charges:  $Therapeutic Activity: 23-37 mins $Self Care/Home Management: 8-22                    G Codes:      Rebecca [name] 03/15/2015, 9:33 AM Rebecca [name] PT [phone]

## 2015-03-15 NOTE — NC FL2 (Deleted)
[place] MEDICAID FL2 LEVEL OF CARE SCREENING TOOL     IDENTIFICATION  Patient Name: Rebecca [name] Birthdate: 12-24-1927 Sex: female Admission Date (Current Location): 03/11/2015  [facility] and [place] Number: Herbalist and Address:  [facility],  [place] [address] [place]      Provider Number: [id number]  Attending Physician Name and Address:  Robbie Lis, MD  Relative Name and Phone Number:       Current Level of Care: Hospital Recommended Level of Care: [place] Prior Approval Number:    Date Approved/Denied:   PASRR Number:   [id number]  Discharge Plan: Other (Comment) (ALF)    Current Diagnoses: Patient Active Problem List   Diagnosis Date Noted  . Acute respiratory failure with hypoxia ([place]) 03/12/2015  . Dyslipidemia 03/12/2015  . Sepsis due to pneumonia ([place]) 03/12/2015  . Acute encephalopathy 03/12/2015  . Healthcare-associated pneumonia 03/11/2015  . Leukocytosis 03/11/2015  . Seizure disorder ([place]) 03/11/2015  . Depression 09/26/2014  . Memory loss 09/19/2012  . Hypothyroidism 12/08/2006    Orientation ACTIVITIES/SOCIAL BLADDER RESPIRATION       Active Continent O2 (As needed) (2 l continuous)  BEHAVIORAL SYMPTOMS/MOOD NEUROLOGICAL BOWEL NUTRITION STATUS      Continent    PHYSICIAN VISITS COMMUNICATION OF NEEDS Height & Weight Skin    Verbally 5' (152.4 cm) 126 lbs. Normal          AMBULATORY STATUS RESPIRATION    Supervision limited O2 (As needed) (2 l continuous)      Personal Care Assistance Level of Assistance               Functional Limitations Info                SPECIAL CARE FACTORS FREQUENCY  PT (By licensed PT), OT (By licensed OT)     PT Frequency: 5 OT Frequency: 5           Additional Factors Info  Code Status, Allergies Code Status Info: DNR Allergies Info: Morphine Related sulfa ABX           Current Medications (03/15/2015): Current  Facility-Administered Medications  Medication Dose Route Frequency Provider Last Rate Last Dose  . 0.9 %  sodium chloride infusion   Intravenous Continuous Robbie Lis, MD 10 mL/hr at 03/14/15 1029    . acetaminophen (TYLENOL) tablet 650 mg  650 mg Oral Q6H PRN Theressa Millard, MD   650 mg at 03/13/15 2127   Or  . acetaminophen (TYLENOL) suppository 650 mg  650 mg Rectal Q6H PRN Theressa Millard, MD      . albuterol (PROVENTIL) (2.5 MG/3ML) 0.083% nebulizer solution 2.5 mg  2.5 mg Nebulization Q6H PRN Theressa Millard, MD   2.5 mg at 03/15/15 0323  . alum & mag hydroxide-simeth (MAALOX/MYLANTA) 200-200-20 MG/5ML suspension 30 mL  30 mL Oral Q6H PRN Theressa Millard, MD      . calcium-vitamin D (OSCAL WITH D) 500-200 MG-UNIT per tablet 1 tablet  1 tablet Oral Daily Theressa Millard, MD   1 tablet at 03/15/15 1004  . cholecalciferol (VITAMIN D) tablet 5,000 Units  5,000 Units Oral Daily Theressa Millard, MD   5,000 Units at 03/15/15 1004  . clonazePAM (KLONOPIN) tablet 0.5 mg  0.5 mg Oral BID PRN Theressa Millard, MD   0.5 mg at 03/13/15 0015  . donepezil (ARICEPT) tablet 10 mg  10 mg Oral Daily Theressa Millard, MD  10 mg at 03/15/15 1004  . enoxaparin (LOVENOX) injection 40 mg  40 mg Subcutaneous Q24H Adrian Saran, RPH   40 mg at 03/15/15 1013  . HYDROmorphone (DILAUDID) injection 0.5-1 mg  0.5-1 mg Intravenous Q3H PRN Theressa Millard, MD      . ipratropium-albuterol (DUONEB) 0.5-2.5 (3) MG/3ML nebulizer solution 3 mL  3 mL Nebulization TID Theressa Millard, MD   3 mL at 03/15/15 0945  . levothyroxine (SYNTHROID, LEVOTHROID) tablet 100 mcg  100 mcg Oral QAC breakfast Theressa Millard, MD   100 mcg at 03/15/15 0827  . loratadine (CLARITIN) tablet 10 mg  10 mg Oral Daily Theressa Millard, MD   10 mg at 03/15/15 1004  . multivitamin with minerals tablet 1 tablet  1 tablet Oral Daily Theressa Millard, MD   1 tablet at 03/15/15 1006  . omega-3 acid ethyl esters (LOVAZA)  capsule 1 g  1 g Oral Daily Theressa Millard, MD   1 g at 03/15/15 1004  . ondansetron (ZOFRAN) tablet 4 mg  4 mg Oral Q6H PRN Theressa Millard, MD       Or  . ondansetron (ZOFRAN) injection 4 mg  4 mg Intravenous Q6H PRN Theressa Millard, MD      . oxyCODONE (Oxy IR/ROXICODONE) immediate release tablet 5 mg  5 mg Oral Q4H PRN Theressa Millard, MD      . phenytoin (DILANTIN) ER capsule 100 mg  100 mg Oral Daily Theressa Millard, MD   100 mg at 03/15/15 1012  . phenytoin (DILANTIN) ER capsule 200 mg  200 mg Oral QHS Theressa Millard, MD   200 mg at 03/14/15 2225  . piperacillin-tazobactam (ZOSYN) IVPB 3.375 g  3.375 g Intravenous 3 times per day Ezequiel Essex, MD   3.375 g at 03/15/15 1012  . saccharomyces boulardii (FLORASTOR) capsule 250 mg  250 mg Oral BID Theressa Millard, MD   250 mg at 03/15/15 1004  . sertraline (ZOLOFT) tablet 25 mg  25 mg Oral Daily Theressa Millard, MD   25 mg at 03/15/15 1005  . vancomycin (VANCOCIN) IVPB 750 mg/150 ml premix  750 mg Intravenous Q12H Berton [place], RPH   750 mg at 03/15/15 0827   Do not use this list as official medication orders. Please verify with discharge summary.  Discharge Medications:   Medication List    ASK your doctor about these medications        acetaminophen 650 MG CR tablet  Commonly known as:  TYLENOL  Take 650 mg by mouth daily as needed for pain.     amoxicillin 500 MG capsule  Commonly known as:  AMOXIL  Take 4 capsules one hour prior to dental appointment     CALCIUM 600+D 600-400 MG-UNIT tablet  Generic drug:  Calcium Carbonate-Vitamin D  Take 1 tablet by mouth daily.     clonazePAM 0.5 MG tablet  Commonly known as:  KLONOPIN  Take 1 tablet (0.5 mg total) by mouth 2 (two) times daily as needed for anxiety.     diphenhydramine-acetaminophen 25-500 MG Tabs tablet  Commonly known as:  TYLENOL PM  Take 1 tablet by mouth at bedtime as needed (sleep).     donepezil 10 MG tablet  Commonly known as:   ARICEPT  Take 10 mg by mouth daily.     donepezil 5 MG tablet  Commonly known as:  ARICEPT  TAKE 1 TAB BY MOUTH EVERY MORNING FOR  28 DAYS (4 WEEKS) THEN INCREASE TO '10MG'$      KLS ALLER-TEC 10 MG tablet  Generic drug:  cetirizine  Take 10 mg by mouth daily as needed for allergies.     levothyroxine 100 MCG tablet  Commonly known as:  SYNTHROID, LEVOTHROID  Take 1 tablet (100 mcg total) by mouth daily before breakfast. For thyroid     meloxicam 15 MG tablet  Commonly known as:  MOBIC  TAKE 1 TABLET BY MOUTH ONCE DAILY TO HELP ARTHRITIS     MULTIVITAMINS PO  Take 1 tablet by mouth daily.     nitrofurantoin (macrocrystal-monohydrate) 100 MG capsule  Commonly known as:  MACROBID  Take 100 mg by mouth 2 (two) times daily.     omega-3 acid ethyl esters 1 G capsule  Commonly known as:  LOVAZA  Take 1 g by mouth daily. Fatty Acid one daily     ondansetron 4 MG tablet  Commonly known as:  ZOFRAN  Take 1 tablet (4 mg total) by mouth every 6 (six) hours as needed for nausea.     phenytoin 100 MG ER capsule  Commonly known as:  DILANTIN  Take 1 capsule by mouth in  the morning and 2 capsules  by mouth at bedtime to prevent seizure.     saccharomyces boulardii 250 MG capsule  Commonly known as:  FLORASTOR  Take 250 mg by mouth 2 (two) times daily.     sertraline 50 MG tablet  Commonly known as:  ZOLOFT  One daily to improve anxiety and depression     traMADol 50 MG tablet  Commonly known as:  ULTRAM  Take 1 tablet (50 mg total) by mouth every 6 (six) hours as needed for moderate pain or severe pain.     Vitamin D-3 5000 UNITS Tabs  Take 5,000 Units by mouth daily.        Relevant Imaging Results:  Relevant Lab Results:  Recent Labs    Additional Information    Ludwig Clarks, LCSW

## 2015-03-15 NOTE — Progress Notes (Signed)
Patient ID: Rebecca [name], female   DOB: 1927-06-20, 81 y.o.   MRN: [id number] [facility] PROGRESS NOTE  Rebecca [name] CZY:[id number] DOB: 11-11-1927 DOA: 03/11/2015 PCP: Estill Dooms, MD  Brief narrative:    81 y.o. femaleresident of the [place] with past medical history of memory loss, depression, dyslipidemia, seizures who presneted to [facility] ED with fevers and worsening mental status changes, confusion started about 1 day prior to this admission. Pt was hypotensive on admission with BP of 80/64, HR 89, RR 26, T max 102.9 F and oxygen saturation of 89% on room air which has improved to 95% with [place] oxygen support. Blood work was notable for WBC count of 13, normal lactic acid and slight elevation in procalcitonin 0.31. CXR showed possible bilateral pneumonia. She was started on vanco and zosyn for treatment of HCAP.  Transferred to medical floor 10/24.  Anticipated discharge: Patient spiked fever overnight so we'll continue to treat with current antibiotics and anticipated discharge by 03/17/2015 to skilled nursing facility.  Assessment/Plan:    Principal Problem:   Sepsis due to pneumonia ([place]) / Healthcare-associated pneumonia / Bilateral pneumonia / Leukocytosis - Sepsis criteria met on admission with fever, tachypnea, hypoxia, hypotension, leukocytosis and elevated procalcitonin level. Source of infection thought to be pneumonia seen on CXR  - Since patient spiked fever overnight, 100.28F we will continue vancomycin and Zosyn for now - Influenza panel is negative. Blood cultures so far are negative. Urine culture shows no growth. - Continue vanco and zosyn through today and change to PO abx prior to discharge tomorrow - Respiratory status is stable. - Continue oxygen support via nasal cannula to keep oxygen saturation above 90%. - Continue DuoNeb 3 times daily scheduled. Continue albuterol nebulizer every 6 hours as needed for shortness of breath or  wheezing  Active Problems:   Acute respiratory failure with hypoxia (HCC) - Secondary to HCAP - Continue treatment with vancomycin and Zosyn. - Continue oxygen support via [place] to keep O2 sats above 90%    Acute encephalopathy - Likely secondary to acute infection, sepsis, pneumonia - Her mental status is good at this morning - Physical therapy has seen the patient in consultation and recommends return to skilled nursing facility once she is medically stable    Hypothyroidism - Continue synthroid 100 g daily    Memory loss - Continue Aricept 10 mg daily    Depression - Stable - Does not feel depressed - Continue zoloft 25 mg daily    Seizure disorder (HCC) - Continue Dilantin 100 mg daily and 200 mg at bedtime - No reports of seizures in hospital    Dyslipidemia - Cntinue omega 3 supplementation   DVT Prophylaxis  - Lovenox subQ in hospital  Code Status: DNR/DNI Family Communication:  plan of care discussed with the patient and her daughter Tressia Miners at the bedside  Disposition Plan: Anticipated discharge by 03/17/2015 provided respiratory status stable.  IV access:  Peripheral IV  Procedures and diagnostic studies:    Dg Chest 2 View 03/11/2015 Patchy bilateral pulmonary infiltrates. Findings are consistent with pneumonia/pneumonitis.  Medical Consultants:  None   Other Consultants:  Physical therapy   IAnti-Infectives:   Vanco 03/11/2015 --> Zosyn 03/11/2015 -->   Leisa Lenz, MD  [facility] Pager [phone]  Time spent in minutes: 25 minutes  If 7PM-7AM, please contact night-coverage [email or web address] Password [email or web address] 03/15/2015, 11:26 AM   LOS: 4 days    HPI/Subjective: No acute overnight events. Patient  more congested this morning. Spiked fever overnight.  Objective: Filed Vitals:   03/14/15 2109 03/15/15 0559 03/15/15 0946 03/15/15 1010  BP: 117/53 115/63  105/50  Pulse: 88 77  85  Temp: 99.2 F (37.3 C) 99 F (37.2 C)  98.1 F (36.7 C)   TempSrc: Oral Oral  Oral  Resp: _0 Height:      Weight:      SpO2: 92% 93% 93% 96%    Intake/Output Summary (Last 24 hours) at 03/15/15 1126 Last data filed at 03/15/15 1039  Gross per 24 hour  Intake    570 ml  Output   1100 ml  Net   -530 ml    Exam:   General:  Pt is alert, awake, no distress, coughing  Cardiovascular: Rate controlled, appreciate S1-S2  Respiratory: Coughing a lot with deep inspiration, coarse breath sounds  Abdomen: Appreciate bowel sounds, nontender abdomen  Extremities: No lower extremity edema, bilateral pulses  Neuro: Nonfocal  Data Reviewed: Basic Metabolic Panel:  Recent Labs Lab 03/11/15 1838 03/12/15 0334 03/15/15 0920  NA 135 142 137  K 4.3 3.7 3.4*  CL 103 109 104  CO2 _1 GLUCOSE 131* 123* 154*  BUN 22* 16 11  CREATININE 0.74 0.70 0.58  CALCIUM 8.6* 8.4* 8.2*   Liver Function Tests:  Recent Labs Lab 03/11/15 1838  AST 22  ALT 15  ALKPHOS 62  BILITOT 0.6  PROT 6.4*  ALBUMIN 3.3*   No results for input(s): LIPASE, AMYLASE in the last 168 hours. No results for input(s): AMMONIA in the last 168 hours. CBC:  Recent Labs Lab 03/11/15 1838 03/12/15 0334 03/15/15 0920  WBC 13.0* 11.0* 9.1  NEUTROABS 11.8*  --   --   HGB 12.5 12.6 10.8*  HCT 38.3 39.0 32.4*  MCV 98.2 100.5* 97.9  PLT 157 155 184   Cardiac Enzymes: No results for input(s): CKTOTAL, CKMB, CKMBINDEX, TROPONINI in the last 168 hours. BNP: Invalid input(s): POCBNP CBG: No results for input(s): GLUCAP in the last 168 hours.  Recent Results (from the past 240 hour(s))  Urine culture     Status: None   Collection Time: 03/11/15  7:26 PM  Result Value Ref Range Status   Specimen Description URINE, CATHETERIZED  Final   Special Requests NONE  Final   Culture   Final    NO GROWTH 1 DAY Performed at [facility]    Report Status 03/13/2015 FINAL  Final  MRSA PCR Screening     Status: None   Collection Time: 03/12/15  1:08  AM  Result Value Ref Range Status   MRSA by PCR NEGATIVE NEGATIVE Final     Scheduled Meds: . calcium-vitamin D  1 tablet Oral Daily  . cholecalciferol  5,000 Units Oral Daily  . donepezil  10 mg Oral Daily  . enoxaparin (LOVENOX) injection  40 mg Subcutaneous Q24H  . ipratropium-albuterol  3 mL Nebulization TID  . levothyroxine  100 mcg Oral QAC breakfast  . loratadine  10 mg Oral Daily  . multivitamin with minerals  1 tablet Oral Daily  . omega-3 acid ethyl esters  1 g Oral Daily  . phenytoin  100 mg Oral Daily  . phenytoin  200 mg Oral QHS  . piperacillin-tazobactam (ZOSYN)  IV  3.375 g Intravenous 3 times per day  . saccharomyces boulardii  250 mg Oral BID  . sertraline  25 mg Oral Daily  . vancomycin  750 mg  Intravenous Q12H   Continuous Infusions: . sodium chloride 10 mL/hr at 03/14/15 1029

## 2015-03-16 ENCOUNTER — Inpatient Hospital Stay (HOSPITAL_COMMUNITY): Payer: Medicare Other

## 2015-03-16 MED ORDER — IPRATROPIUM-ALBUTEROL 0.5-2.5 (3) MG/3ML IN SOLN
3.0000 mL | Freq: Four times a day (QID) | RESPIRATORY_TRACT | Status: DC
  Administered 2015-03-16 – 2015-03-18 (×9): 3 mL via RESPIRATORY_TRACT
  Filled 2015-03-16 (×9): qty 3

## 2015-03-16 MED ORDER — IPRATROPIUM BROMIDE 0.02 % IN SOLN
0.5000 mg | RESPIRATORY_TRACT | Status: DC | PRN
Start: 2015-03-16 — End: 2015-03-18

## 2015-03-16 MED ORDER — GUAIFENESIN-DM 100-10 MG/5ML PO SYRP: 5.0000 mL | ORAL_SOLUTION | ORAL | Status: DC | PRN

## 2015-03-16 MED ORDER — ALBUTEROL SULFATE (2.5 MG/3ML) 0.083% IN NEBU: 2.5000 mg | INHALATION_SOLUTION | Freq: Four times a day (QID) | RESPIRATORY_TRACT | Status: DC

## 2015-03-16 MED ORDER — ALBUTEROL SULFATE (2.5 MG/3ML) 0.083% IN NEBU: 2.5000 mg | INHALATION_SOLUTION | RESPIRATORY_TRACT | Status: DC | PRN

## 2015-03-16 MED ORDER — DIPHENHYDRAMINE HCL 25 MG PO CAPS
25.0000 mg | ORAL_CAPSULE | Freq: Once | ORAL | Status: AC
  Administered 2015-03-16: 25 mg via ORAL
  Filled 2015-03-16: qty 1

## 2015-03-16 MED ORDER — POTASSIUM CHLORIDE CRYS ER 20 MEQ PO TBCR
40.0000 meq | EXTENDED_RELEASE_TABLET | Freq: Once | ORAL | Status: AC
  Administered 2015-03-16: 40 meq via ORAL
  Filled 2015-03-16: qty 2

## 2015-03-16 NOTE — Plan of Care (Signed)
Problem: Consults Goal: Skin Care Protocol Initiated - if Braden Score 18 or less If consults are not indicated, leave blank or document N/A  Erythematous sacral area; barrier cream applied, repositioned pt on L side  Problem: Phase III Progression Outcomes Goal: Foley discontinued Outcome: Completed/Met Date Met:  03/16/15 No foley

## 2015-03-16 NOTE — Progress Notes (Signed)
CSW advised [facility] of possible dc this weekend

## 2015-03-16 NOTE — Progress Notes (Signed)
ANTIBIOTIC CONSULT NOTE - Follow-up  Pharmacy Consult for Vancomycin, Zosyn Indication: rule out sepsis, PNA  Allergies  Allergen Reactions  . Morphine And Related Other (See Comments)    Altered mental state   . Sulfa Antibiotics Other (See Comments)    Reaction: unknown    Patient Measurements: Height: 5' (152.4 cm) Weight: 126 lb (57.153 kg) IBW/kg (Calculated) : 45.5  Vital Signs: Temp: 97.5 F (36.4 C) (10/28 0845) Temp Source: Oral (10/28 0845) BP: 104/65 mmHg (10/28 0845) Pulse Rate: 78 (10/28 1000) Intake/Output from previous day: 10/27 0701 - 10/28 0700 In: 1539.7 [P.O.:713; I.V.:586.7; IV Piggyback:240] Out: 900 [Urine:900] Intake/Output from this shift: Total I/O In: 870 [P.O.:670; IV Piggyback:200] Out: 700 [Urine:700]  Labs:  Recent Labs  03/15/15 0920  WBC 9.1  HGB 10.8*  PLT 184  CREATININE 0.58   Estimated Creatinine Clearance: 40.7 mL/min (by C-G formula based on Cr of 0.58).  Recent Labs  03/14/15 0645  [facility] 11     Microbiology: Recent Results (from the past 720 hour(s))  Blood Culture (routine x 2)     Status: None (Preliminary result)   Collection Time: 03/11/15  6:30 PM  Result Value Ref Range Status   Specimen Description RIGHT ANTECUBITAL  Final   Special Requests BOTTLES DRAWN AEROBIC AND ANAEROBIC 5CC  Final   Culture   Final    NO GROWTH 3 DAYS Performed at [facility]    Report Status PENDING  Incomplete  Blood Culture (routine x 2)     Status: None (Preliminary result)   Collection Time: 03/11/15  6:35 PM  Result Value Ref Range Status   Specimen Description BLOOD RIGHT FOREARM  Final   Special Requests BOTTLES DRAWN AEROBIC AND ANAEROBIC 5CC  Final   Culture   Final    NO GROWTH 3 DAYS Performed at [facility]    Report Status PENDING  Incomplete  Urine culture     Status: None   Collection Time: 03/11/15  7:26 PM  Result Value Ref Range Status   Specimen Description URINE, CATHETERIZED   Final   Special Requests NONE  Final   Culture   Final    NO GROWTH 1 DAY Performed at [facility]    Report Status 03/13/2015 FINAL  Final  MRSA PCR Screening     Status: None   Collection Time: 03/12/15  1:08 AM  Result Value Ref Range Status   MRSA by PCR NEGATIVE NEGATIVE Final    Comment:        The GeneXpert MRSA Assay (FDA approved for NASAL specimens only), is one component of a comprehensive MRSA colonization surveillance program. It is not intended to diagnose MRSA infection nor to guide or monitor treatment for MRSA infections.     Assessment: 75 y.oF with PMH of seizures, DJD, osteoporosis hypothyroidism, anemia currently being treated with Macrobid for UTI who presents to [facility] ED fom [facility] due to altered mental status and fever. CXR shows patchy bilateral pulmonary infiltrates; findings are consistent with pneumonia/pneumonitis. UA with cloudy urine, many bacteria, large leukocytes, nitrite positive. Pharmacy consulted to assist with dosing of Vancomycin and Zosyn..   Today, 03/16/2015:  afeb, wbc wnl  scr stable (crcl~40)  PTA Macrobid 10/23 >> Vancomycin >> 10/23 >> Zosyn >>    10/23 blood x 2: NGTD 10/23 urine: neg FINAL 10/24 MRSA negative  10/24 Flu PCR: neg  Dose changes/levels: Marland Kitchen Vancomycin trough 10/26 0645 = 55mg/ml (on vanco '500mg'$   IV q12h -prior to 6th dose) Change vanco to '750mg'$  IV q12h for low VT  Goal of Therapy:  Vancomycin trough level 15-20 mcg/ml  Appropriate antibiotic dosing for renal function and indication Eradication of infection  Plan:  D5 abx  - continue vanco '750mg'$   q12h. Plan on changing to PO abx at discharge-- possible d/c to SNF on 10/29. Will hold off on rechecking level for now - zosyn EI - F/u cx  Dia Sitter, PharmD, BCPS 03/16/2015 1:16 PM

## 2015-03-16 NOTE — Progress Notes (Signed)
Pt c/o pain in Right upper arm;  Pt unable to elevate upper arm, able to lift and extend lower arm.  Pain is exacerbated by movement, and is not radiating.  RN [employer] informed.

## 2015-03-16 NOTE — NC FL2 (Signed)
MEDICAID FL2 LEVEL OF CARE SCREENING TOOL     IDENTIFICATION  Patient Name: Rebecca [name] Birthdate: 1928/04/13 Sex: female Admission Date (Current Location): 03/11/2015  [facility] and [place] Number: Herbalist and Address:  [facility],  [place] [address] [place]      Provider Number: [id number]  Attending Physician Name and Address:  Robbie Lis, MD  Relative Name and Phone Number:       Current Level of Care: Hospital Recommended Level of Care: [place] Prior Approval Number:    Date Approved/Denied:   PASRR Number: [id number]  Discharge Plan: Other (Comment) (ALF)    Current Diagnoses: Patient Active Problem List   Diagnosis Date Noted  . Acute respiratory failure with hypoxia ([place]) 03/12/2015  . Dyslipidemia 03/12/2015  . Sepsis due to pneumonia ([place]) 03/12/2015  . Acute encephalopathy 03/12/2015  . Healthcare-associated pneumonia 03/11/2015  . Leukocytosis 03/11/2015  . Seizure disorder ([place]) 03/11/2015  . Depression 09/26/2014  . Memory loss 09/19/2012  . Hypothyroidism 12/08/2006    Orientation ACTIVITIES/SOCIAL BLADDER RESPIRATION    Self, Time, Situation, Place  Active Continent O2 (As needed) (2 l continuous)  BEHAVIORAL SYMPTOMS/MOOD NEUROLOGICAL BOWEL NUTRITION STATUS      Continent    PHYSICIAN VISITS COMMUNICATION OF NEEDS Height & Weight Skin    Verbally 5' (152.4 cm) 126 lbs. Normal          AMBULATORY STATUS RESPIRATION    Supervision limited O2 (As needed) (2 l continuous)      Personal Care Assistance Level of Assistance  Bathing, Dressing Bathing Assistance: Limited assistance   Dressing Assistance: Limited assistance      Functional Limitations [place]  PT (By licensed PT), OT (By licensed OT)     PT Frequency: 5 OT Frequency: 5           Additional Factors Info  Code Status, Allergies Code Status Info:  DNR Allergies Info: Morphine Related sulfa ABX           Current Medications (03/16/2015): Current Facility-Administered Medications  Medication Dose Route Frequency Provider Last Rate Last Dose  . 0.9 %  sodium chloride infusion   Intravenous Continuous Robbie Lis, MD 10 mL/hr at 03/15/15 1500    . acetaminophen (TYLENOL) tablet 650 mg  650 mg Oral Q6H PRN Theressa Millard, MD   650 mg at 03/13/15 2127   Or  . acetaminophen (TYLENOL) suppository 650 mg  650 mg Rectal Q6H PRN Theressa Millard, MD      . albuterol (PROVENTIL) (2.5 MG/3ML) 0.083% nebulizer solution 2.5 mg  2.5 mg Nebulization Q2H PRN Robbie Lis, MD      . alum & mag hydroxide-simeth (MAALOX/MYLANTA) 200-200-20 MG/5ML suspension 30 mL  30 mL Oral Q6H PRN Theressa Millard, MD      . calcium-vitamin D (OSCAL WITH D) 500-200 MG-UNIT per tablet 1 tablet  1 tablet Oral Daily Theressa Millard, MD   1 tablet at 03/16/15 [phone]  . cholecalciferol (VITAMIN D) tablet 5,000 Units  5,000 Units Oral Daily Theressa Millard, MD   5,000 Units at 03/16/15 [phone]  . clonazePAM (KLONOPIN) tablet 0.5 mg  0.5 mg Oral BID PRN Theressa Millard, MD   0.5 mg at 03/13/15 0015  . donepezil (ARICEPT) tablet 10 mg  10 mg Oral Daily Harvette C Jenkins,  MD   10 mg at 03/16/15 0944  . enoxaparin (LOVENOX) injection 40 mg  40 mg Subcutaneous Q24H Adrian Saran, RPH   40 mg at 03/16/15 0946  . guaiFENesin-dextromethorphan (ROBITUSSIN DM) 100-10 MG/5ML syrup 5 mL  5 mL Oral Q4H PRN Robbie Lis, MD      . HYDROmorphone (DILAUDID) injection 0.5-1 mg  0.5-1 mg Intravenous Q3H PRN Theressa Millard, MD   1 mg at 03/15/15 1956  . ipratropium (ATROVENT) nebulizer solution 0.5 mg  0.5 mg Nebulization Q2H PRN Robbie Lis, MD      . ipratropium-albuterol (DUONEB) 0.5-2.5 (3) MG/3ML nebulizer solution 3 mL  3 mL Nebulization Q6H Robbie Lis, MD   3 mL at 03/16/15 1454  . levothyroxine (SYNTHROID, LEVOTHROID) tablet 100 mcg  100 mcg Oral QAC breakfast  Theressa Millard, MD   100 mcg at 03/16/15 0906  . loratadine (CLARITIN) tablet 10 mg  10 mg Oral Daily Theressa Millard, MD   10 mg at 03/16/15 0944  . multivitamin with minerals tablet 1 tablet  1 tablet Oral Daily Theressa Millard, MD   1 tablet at 03/16/15 0945  . omega-3 acid ethyl esters (LOVAZA) capsule 1 g  1 g Oral Daily Theressa Millard, MD   1 g at 03/16/15 0944  . ondansetron (ZOFRAN) tablet 4 mg  4 mg Oral Q6H PRN Theressa Millard, MD       Or  . ondansetron (ZOFRAN) injection 4 mg  4 mg Intravenous Q6H PRN Theressa Millard, MD      . oxyCODONE (Oxy IR/ROXICODONE) immediate release tablet 5 mg  5 mg Oral Q4H PRN Theressa Millard, MD      . phenytoin (DILANTIN) ER capsule 100 mg  100 mg Oral Daily Theressa Millard, MD   100 mg at 03/16/15 0945  . phenytoin (DILANTIN) ER capsule 200 mg  200 mg Oral QHS Theressa Millard, MD   200 mg at 03/15/15 2115  . piperacillin-tazobactam (ZOSYN) IVPB 3.375 g  3.375 g Intravenous 3 times per day Ezequiel Essex, MD   3.375 g at 03/16/15 1050  . saccharomyces boulardii (FLORASTOR) capsule 250 mg  250 mg Oral BID Theressa Millard, MD   250 mg at 03/16/15 0946  . sertraline (ZOLOFT) tablet 25 mg  25 mg Oral Daily Theressa Millard, MD   25 mg at 03/16/15 0945  . vancomycin (VANCOCIN) IVPB 750 mg/150 ml premix  750 mg Intravenous Q12H Berton Mount, RPH   750 mg at 03/16/15 9833   Do not use this list as official medication orders. Please verify with discharge summary.  Discharge Medications:   Medication List    ASK your doctor about these medications        acetaminophen 650 MG CR tablet  Commonly known as:  TYLENOL  Take 650 mg by mouth daily as needed for pain.     amoxicillin 500 MG capsule  Commonly known as:  AMOXIL  Take 4 capsules one hour prior to dental appointment     CALCIUM 600+D 600-400 MG-UNIT tablet  Generic drug:  Calcium Carbonate-Vitamin D  Take 1 tablet by mouth daily.     clonazePAM 0.5 MG  tablet  Commonly known as:  KLONOPIN  Take 1 tablet (0.5 mg total) by mouth 2 (two) times daily as needed for anxiety.     diphenhydramine-acetaminophen 25-500 MG Tabs tablet  Commonly known as:  TYLENOL PM  Take 1  tablet by mouth at bedtime as needed (sleep).     donepezil 10 MG tablet  Commonly known as:  ARICEPT  Take 10 mg by mouth daily.     donepezil 5 MG tablet  Commonly known as:  ARICEPT  TAKE 1 TAB BY MOUTH EVERY MORNING FOR 28 DAYS (4 WEEKS) THEN INCREASE TO '10MG'$      KLS ALLER-TEC 10 MG tablet  Generic drug:  cetirizine  Take 10 mg by mouth daily as needed for allergies.     levothyroxine 100 MCG tablet  Commonly known as:  SYNTHROID, LEVOTHROID  Take 1 tablet (100 mcg total) by mouth daily before breakfast. For thyroid     meloxicam 15 MG tablet  Commonly known as:  MOBIC  TAKE 1 TABLET BY MOUTH ONCE DAILY TO HELP ARTHRITIS     MULTIVITAMINS PO  Take 1 tablet by mouth daily.     nitrofurantoin (macrocrystal-monohydrate) 100 MG capsule  Commonly known as:  MACROBID  Take 100 mg by mouth 2 (two) times daily.     omega-3 acid ethyl esters 1 G capsule  Commonly known as:  LOVAZA  Take 1 g by mouth daily. Fatty Acid one daily     ondansetron 4 MG tablet  Commonly known as:  ZOFRAN  Take 1 tablet (4 mg total) by mouth every 6 (six) hours as needed for nausea.     phenytoin 100 MG ER capsule  Commonly known as:  DILANTIN  Take 1 capsule by mouth in  the morning and 2 capsules  by mouth at bedtime to prevent seizure.     saccharomyces boulardii 250 MG capsule  Commonly known as:  FLORASTOR  Take 250 mg by mouth 2 (two) times daily.     sertraline 50 MG tablet  Commonly known as:  ZOLOFT  One daily to improve anxiety and depression     traMADol 50 MG tablet  Commonly known as:  ULTRAM  Take 1 tablet (50 mg total) by mouth every 6 (six) hours as needed for moderate pain or severe pain.     Vitamin D-3 5000 UNITS Tabs  Take 5,000 Units by mouth daily.         Relevant Imaging Results:  Relevant Lab Results:  Recent Labs    Additional Information    Ludwig Clarks, LCSW

## 2015-03-16 NOTE — Progress Notes (Addendum)
Patient ID: Rebecca [name], female   DOB: 08-29-1927, 81 y.o.   MRN: [id number] [facility] PROGRESS NOTE  Rebecca [name] [id number] DOB: 1927/07/27 DOA: 03/11/2015 PCP: Estill Dooms, MD  Brief narrative:    81 y.o. [place] the [place] with past medical history of memory loss, depression, dyslipidemia, seizures who presneted to [facility] ED with fevers and worsening mental status changes, confusion started about 1 day prior to this admission. Pt was hypotensive on admission with BP of 80/64, HR 89, RR 26, T max 102.9 F and oxygen saturation of 89% on room air which has improved to 95% with [place] oxygen support. Blood work was notable for WBC count of 13, normal lactic acid and slight elevation in procalcitonin 0.31. CXR showed possible bilateral pneumonia. She was started on vanco and zosyn for treatment of HCAP.  Transferred to medical floor 10/24.  Anticipated discharge: If patient's respiratory status remains stable she will most likely be ready for discharge 10/29.   Assessment/Plan:    Principal Problem:   Sepsis due to pneumonia ([place]) / Healthcare-associated pneumonia / Bilateral pneumonia / Leukocytosis - Sepsis criteria were met on admission with fever, tachypnea, hypoxia, hypotension, leukocytosis and elevated procalcitonin level. Source of infection thought to be pneumonia. CXR on admission showed possible bilateral pneumonia. - We will repeat CXR this am to monitor on resolution of pneumonia - Since she has spiked fever 10/27 we have continues broad spectrum abx which were initiated on the admission - Continue albuterol and Atrovent scheduled and as needed.  - Continue oxygen support via nasal cannula to keep oxygen saturation above 90%. - Influenza panel is negative.  - Blood cultures negative to date. Urine culture negative to date.  Active Problems:   Acute respiratory failure with hypoxia (HCC) - Likely due to pneumonia - As noted  above, we will repeat CXR today to monitor resolution of pneumonia - Continue current treatment with vancomycin and Zosyn. - Continue oxygen support via [place] to keep O2 sats above 90%    Acute encephalopathy / Memory loss - In the setting of sepsis, pneumonia - Has had periods of confusion but overall mental status much improved since admission  - Continue Aricept 10 mg daily    Hypothyroidism - Continue synthroid 100 g daily    Depression - Stable - Continue zoloft 25 mg daily    Seizure disorder (HCC) - No seizures reports during this hospital stay  - Continue Dilantin 100 mg daily and 200 mg at bedtime    Dyslipidemia - Cntinue omega 3 supplementation   DVT Prophylaxis  - Lovenox subQ ordered   Code Status: DNR/DNI Family Communication:  plan of care discussed with the patient and her daughter Tressia Miners at the bedside  Disposition Plan: Anticipated discharge by 03/17/2015  IV access:  Peripheral IV  Procedures and diagnostic studies:    Dg Chest 2 View 03/11/2015 Patchy bilateral pulmonary infiltrates. Findings are consistent with pneumonia/pneumonitis.  Medical Consultants:  None   Other Consultants:  Physical therapy   IAnti-Infectives:   Vanco 03/11/2015 --> Zosyn 03/11/2015 -->   Leisa Lenz, MD  [facility] Pager [phone]  Time spent in minutes: 25 minutes  If 7PM-7AM, please contact night-coverage [email or web address] Password [facility] 03/16/2015, 5:37 AM   LOS: 5 days    HPI/Subjective: No acute overnight events. Patient more congested this morning. Spiked fever overnight.  Objective: Filed Vitals:   03/15/15 2155 03/15/15 2204 03/15/15 2218 03/16/15 0230  BP:  111/55    Pulse:  106    Temp:  99 F (37.2 C)    TempSrc:  Oral    Resp:  22    Height:      Weight:      SpO2: 90%  97% 96%    Intake/Output Summary (Last 24 hours) at 03/16/15 0537 Last data filed at 03/16/15 0247  Gross per 24 hour  Intake 1508.5 ml  Output   1200 ml  Net   308.5 ml    Exam:   General:  Pt is alert, awake, no distress, coughing  Cardiovascular: Rate controlled, appreciate S1-S2  Respiratory: Coughing a lot with deep inspiration, coarse breath sounds  Abdomen: Appreciate bowel sounds, nontender abdomen  Extremities: No lower extremity edema, bilateral pulses  Neuro: Nonfocal  Data Reviewed: Basic Metabolic Panel:  Recent Labs Lab 03/11/15 1838 03/12/15 0334 03/15/15 0920  NA 135 142 137  K 4.3 3.7 3.4*  CL 103 109 104  CO2 '24 26 26  ' GLUCOSE 131* 123* 154*  BUN 22* 16 11  CREATININE 0.74 0.70 0.58  CALCIUM 8.6* 8.4* 8.2*   Liver Function Tests:  Recent Labs Lab 03/11/15 1838  AST 22  ALT 15  ALKPHOS 62  BILITOT 0.6  PROT 6.4*  ALBUMIN 3.3*   No results for input(s): LIPASE, AMYLASE in the last 168 hours. No results for input(s): AMMONIA in the last 168 hours. CBC:  Recent Labs Lab 03/11/15 1838 03/12/15 0334 03/15/15 0920  WBC 13.0* 11.0* 9.1  NEUTROABS 11.8*  --   --   HGB 12.5 12.6 10.8*  HCT 38.3 39.0 32.4*  MCV 98.2 100.5* 97.9  PLT 157 155 184   Cardiac Enzymes: No results for input(s): CKTOTAL, CKMB, CKMBINDEX, TROPONINI in the last 168 hours. BNP: Invalid input(s): POCBNP CBG: No results for input(s): GLUCAP in the last 168 hours.  Recent Results (from the past 240 hour(s))  Urine culture     Status: None   Collection Time: 03/11/15  7:26 PM  Result Value Ref Range Status   Specimen Description URINE, CATHETERIZED  Final   Special Requests NONE  Final   Culture   Final    NO GROWTH 1 DAY Performed at [facility]    Report Status 03/13/2015 FINAL  Final  MRSA PCR Screening     Status: None   Collection Time: 03/12/15  1:08 AM  Result Value Ref Range Status   MRSA by PCR NEGATIVE NEGATIVE Final     Scheduled Meds: . calcium-vitamin D  1 tablet Oral Daily  . cholecalciferol  5,000 Units Oral Daily  . donepezil  10 mg Oral Daily  . enoxaparin (LOVENOX) injection  40 mg  Subcutaneous Q24H  . ipratropium-albuterol  3 mL Nebulization TID  . levothyroxine  100 mcg Oral QAC breakfast  . loratadine  10 mg Oral Daily  . multivitamin with minerals  1 tablet Oral Daily  . omega-3 acid ethyl esters  1 g Oral Daily  . phenytoin  100 mg Oral Daily  . phenytoin  200 mg Oral QHS  . piperacillin-tazobactam (ZOSYN)  IV  3.375 g Intravenous 3 times per day  . potassium chloride  40 mEq Oral Once  . saccharomyces boulardii  250 mg Oral BID  . sertraline  25 mg Oral Daily  . vancomycin  750 mg Intravenous Q12H   Continuous Infusions: . sodium chloride 10 mL/hr at 03/15/15 1500

## 2015-03-16 NOTE — Progress Notes (Signed)
Dr. Charlies Silvers made aware patient having difficulty moving her left arm.  No new orders at this time. MD to assess on rounds

## 2015-03-16 NOTE — Care Management Important Message (Signed)
Important Message  Patient Details  Name: SAWSAN RIGGIO MRN: [id number] Date of Birth: 21-Dec-1927   Medicare Important Message Given:  Yes-third notification given    Shelda Altes 03/16/2015, 3:40 [place] Message  Patient Details  Name: JAONNA WORD MRN: [id number] Date of Birth: 04-30-1928   Medicare Important Message Given:  Yes-third notification given    Shelda Altes 03/16/2015, 3:40 PM

## 2015-03-17 LAB — CULTURE, BLOOD (ROUTINE X 2)
Culture: NO GROWTH
Culture: NO GROWTH

## 2015-03-17 LAB — BASIC METABOLIC PANEL
Anion gap: 6 (ref 5–15)
BUN: 13 mg/dL (ref 6–20)
CALCIUM: 8.3 mg/dL — AB (ref 8.9–10.3)
CO2: 27 mmol/L (ref 22–32)
CREATININE: 0.57 mg/dL (ref 0.44–1.00)
Chloride: 106 mmol/L (ref 101–111)
GFR calc Af Amer: >60 mL/min (ref >60)
Glucose, Bld: 112 mg/dL — ABNORMAL HIGH (ref 65–99)
Potassium: 3.8 mmol/L (ref 3.5–5.1)
SODIUM: 139 mmol/L (ref 135–145)

## 2015-03-17 LAB — STREP PNEUMONIAE URINARY ANTIGEN: Strep Pneumo Urinary Antigen: NEGATIVE

## 2015-03-17 MED ORDER — CLONAZEPAM 0.5 MG PO TABS: 0.5000 mg | ORAL_TABLET | Freq: Two times a day (BID) | ORAL | Status: DC | PRN

## 2015-03-17 MED ORDER — TRAMADOL HCL 50 MG PO TABS: 50.0000 mg | ORAL_TABLET | Freq: Four times a day (QID) | ORAL | Status: DC | PRN

## 2015-03-17 MED ORDER — LEVOFLOXACIN IN D5W 750 MG/150ML IV SOLN
750.0000 mg | INTRAVENOUS | Status: DC
  Administered 2015-03-17: 750 mg via INTRAVENOUS
  Filled 2015-03-17 (×2): qty 150

## 2015-03-17 MED ORDER — GUAIFENESIN-DM 100-10 MG/5ML PO SYRP: 5.0000 mL | ORAL_SOLUTION | ORAL | Status: DC | PRN

## 2015-03-17 MED ORDER — LEVOFLOXACIN 500 MG PO TABS: 500.0000 mg | ORAL_TABLET | Freq: Every day | ORAL | Status: DC

## 2015-03-17 MED ORDER — FUROSEMIDE 10 MG/ML IJ SOLN
20.0000 mg | Freq: Once | INTRAMUSCULAR | Status: AC
  Administered 2015-03-17: 20 mg via INTRAVENOUS
  Filled 2015-03-17: qty 2

## 2015-03-17 NOTE — Discharge Instructions (Signed)

## 2015-03-17 NOTE — Clinical Social Work Note (Signed)
CSW reviewed MD's 10/29 progress note and per note, anticipated discharge is 10/31.  CSW will continue to follow and facilitate discharge back to [facility] when medically stable.  Jaquavius Hudler Givens, MSW, LCSW Licensed Clinical Social Worker [place] [phone]

## 2015-03-17 NOTE — Progress Notes (Signed)
ANTIBIOTIC CONSULT NOTE - Follow-up  Pharmacy Consult for levofloxacin Indication: rule out sepsis, PNA  Allergies  Allergen Reactions  . Morphine And Related Other (See Comments)    Altered mental state   . Sulfa Antibiotics Other (See Comments)    Reaction: unknown    Patient Measurements: Height: 5' (152.4 cm) Weight: 126 lb (57.153 kg) IBW/kg (Calculated) : 45.5  Vital Signs: Temp: 98.3 F (36.8 C) (10/29 0605) Temp Source: Oral (10/29 0605) BP: 122/55 mmHg (10/29 0605) Pulse Rate: 78 (10/29 0605) Intake/Output from previous day: 10/28 0701 - 10/29 0700 In: 1961 [P.O.:1390; I.V.:121; IV Piggyback:450] Out: 1750 [Urine:1750] Intake/Output from this shift:    Labs:  Recent Labs  03/15/15 0920 03/17/15 0541  WBC 9.1  --   HGB 10.8*  --   PLT 184  --   CREATININE 0.58 0.57   Estimated Creatinine Clearance: 40.7 mL/min (by C-G formula based on Cr of 0.57). No results for input(s): VANCOTROUGH, VANCOPEAK, VANCORANDOM, GENTTROUGH, GENTPEAK, GENTRANDOM, TOBRATROUGH, TOBRAPEAK, TOBRARND, AMIKACINPEAK, AMIKACINTROU, AMIKACIN in the last 72 hours.   Microbiology: Recent Results (from the past 720 hour(s))  Blood Culture (routine x 2)     Status: None (Preliminary result)   Collection Time: 03/11/15  6:30 PM  Result Value Ref Range Status   Specimen Description RIGHT ANTECUBITAL  Final   Special Requests BOTTLES DRAWN AEROBIC AND ANAEROBIC 5CC  Final   Culture   Final    NO GROWTH 4 DAYS Performed at [facility]    Report Status PENDING  Incomplete  Blood Culture (routine x 2)     Status: None (Preliminary result)   Collection Time: 03/11/15  6:35 PM  Result Value Ref Range Status   Specimen Description BLOOD RIGHT FOREARM  Final   Special Requests BOTTLES DRAWN AEROBIC AND ANAEROBIC 5CC  Final   Culture   Final    NO GROWTH 4 DAYS Performed at [facility]    Report Status PENDING  Incomplete  Urine culture     Status: None   Collection  Time: 03/11/15  7:26 PM  Result Value Ref Range Status   Specimen Description URINE, CATHETERIZED  Final   Special Requests NONE  Final   Culture   Final    NO GROWTH 1 DAY Performed at [facility]    Report Status 03/13/2015 FINAL  Final  MRSA PCR Screening     Status: None   Collection Time: 03/12/15  1:08 AM  Result Value Ref Range Status   MRSA by PCR NEGATIVE NEGATIVE Final    Comment:        The GeneXpert MRSA Assay (FDA approved for NASAL specimens only), is one component of a comprehensive MRSA colonization surveillance program. It is not intended to diagnose MRSA infection nor to guide or monitor treatment for MRSA infections.     Assessment: 49 y.oF with PMH of seizures, DJD, osteoporosis hypothyroidism, anemia currently being treated with Macrobid for UTI who presents to [facility] ED fom [facility]F due to altered mental status and fever. CXR shows patchy bilateral pulmonary infiltrates; findings are consistent with pneumonia/pneumonitis. UA with cloudy urine, many bacteria, large leukocytes, nitrite positive. Pharmacy consulted to assist with dosing of levofloxacin   Today, 03/17/2015:  afeb, wbc wnl  scr stable (crcl~40)  PTA Macrobid 10/23 >> Vancomycin >> 10/29 10/23 >> Zosyn >>  10/29 10/29 >> levofloxacin >>   10/23 blood x 2: NGTD 10/23 urine: neg FINAL 10/24 MRSA negative  10/24 Flu PCR: neg  Goal of Therapy:  Appropriate antibiotic dosing for renal function and indication Eradication of infection  Plan:  10/29 Zosyn and vancomycin were ordered to be stopped after AM dose. 10/29 Start levofloxacin 750 mg IV q48h  Royetta Asal PharmD, BCPS  03/17/2015 11:16 AM

## 2015-03-17 NOTE — Progress Notes (Signed)
Patient ID: Rebecca [name], female   DOB: 01/04/1928, 81 y.o.   MRN: [id number] [facility] PROGRESS NOTE  Rebecca [name] MRN:[id number] DOB: 08/16/1927 DOA: 03/11/2015 PCP: GREEN, ARTHUR G, MD  Brief narrative:    81 [facility] with past medical history of memory loss, depression, dyslipidemia, seizures who presneted to [facility] ED with fevers and worsening mental status changes, confusion started about 1 day prior to this admission. Pt was hypotensive on admission with BP of 80/64, HR 89, RR 26, T max 102.9 F and oxygen saturation of 89% on room air which has improved to 95% with [place] oxygen support. Blood work was notable for WBC count of 13, normal lactic acid and slight elevation in procalcitonin 0.31. CXR showed possible bilateral pneumonia. She was started on vanco and zosyn for treatment of HCAP.  Transferred to medical floor 10/24.  Barrier to discharge: Pt still congested, not stable for discharge. Will change abx to Levaquin today.    Assessment/Plan:    Principal Problem:   Sepsis due to pneumonia (HCC) / Healthcare-associated pneumonia / Bilateral pneumonia / Leukocytosis - Please note sepsis criteria were met on admission with fever, tachypnea, hypoxia, hypotension, leukocytosis and elevated procalcitonin level. Source of infection thought to be pneumonia. CXR on admission showed possible bilateral pneumonia. - Pt still rhonchorous on physical exam - We repeated CXR 10/28 which demonstrated worsening asymmetric interstitial and airspace edema or infiltrates, right worse than left. Will give one dose of 20 mg IV lasix. - She also had CT chest done 10/28 and is demonstrated scattered right greater than left pulmonary ground-glass opacity and some septal thickening superimposed on suspected chronic lung disease with emphysema and/or a degree of pulmonary fibrosis. Favor atypical infection, especially viral.  - Pt wants to go home  but she is not stable for discharge at this time. - Continue duoneb every 6 hours scheduled and albuterol every 2 hours as needed for shortness of breath or wheezing  - Influenza panel is negative.  - Blood cultures negative to date. Urine culture negative to date. - Continue oxygen support via nasal cannula to keep oxygen saturation above 90%.  Active Problems:   Acute respiratory failure with hypoxia (HCC) - secondary to pneumonia - Able to keep O2 sats above 90% with 2L [place] oxygen support - Continue nebulizer treatments     Acute encephalopathy / Memory loss - In the setting of sepsis and pneumonia - Continue Aricept 10 mg daily - Intermittently confused     Hypothyroidism - Continue synthroid 100 g daily    Depression - Continue zoloft 25 mg daily    Seizure disorder (HCC) - Continue Dilantin 100 mg daily and 200 mg at bedtime    Dyslipidemia - Cntinue omega 3 supplementation   DVT Prophylaxis  - Lovenox subQ in hospital   Code Status: DNR/DNI Family Communication:  plan of care discussed with the patient and her daughter Traci at the bedside  Disposition Plan: Anticipated discharge by 03/19/2015.  IV access:  Peripheral IV  Procedures and diagnostic studies:    Ct Chest Wo Contrast 03/16/2015  1. Scattered right greater than left pulmonary ground-glass opacity and some septal thickening superimposed on suspected chronic lung disease with emphysema and/or a degree of pulmonary fibrosis. Favor atypical infection, especially viral. 2. Small to moderate layering bilateral pleural effusions. Reactive appearing mediastinal lymphadenopathy. 3. Cardiomegaly with no pericardial effusion. Electronically Signed   By: H  Hall M.D.     On: 03/16/2015 15:02   Dg Chest Port 1 View 03/16/2015   1. Worsening asymmetric interstitial and airspace edema or infiltrates, right worse than left. Electronically Signed   By: Lucrezia Europe M.D.   On: 03/16/2015 10:23   Dg Humerus Right 03/16/2015   Negative humerus. Electronically Signed   By: Lucrezia Europe M.D.   On: 03/16/2015 10:24   Dg Chest 2 View 03/11/2015 Patchy bilateral pulmonary infiltrates. Findings are consistent with pneumonia/pneumonitis.  Medical Consultants:  None   Other Consultants:  Physical therapy   IAnti-Infectives:   Vanco 03/11/2015 --> 03/17/2015 Zosyn 03/11/2015 --> 03/17/2015  Levaquin 03/17/2015 -->    Heena Woodbury, MD  [facility] Pager [phone]  Time spent in minutes: 25 minutes  If 7PM-7AM, please contact night-coverage [email or web address] Password [facility] 03/17/2015, 7:41 AM   LOS: 6 days    HPI/Subjective: No acute overnight events. Patient congested. Wants to go home.   Objective: Filed Vitals:   03/16/15 1506 03/16/15 2103 03/16/15 2142 03/17/15 0605  BP: 123/59  126/64 122/55  Pulse: 86  85 78  Temp: 99.1 F (37.3 C)  98.8 F (37.1 C) 98.3 F (36.8 C)  TempSrc: Oral  Oral Oral  Resp: _0 Height:      Weight:      SpO2: 96% 92% 97% 97%    Intake/Output Summary (Last 24 hours) at 03/17/15 0741 Last data filed at 03/17/15 0500  Gross per 24 hour  Intake   1961 ml  Output   1750 ml  Net    211 ml    Exam:   General:  Pt is not in distress  Cardiovascular: RRR, appreciate S1, S2   Respiratory: rhonchorous, no wheezing   Abdomen: (+) BS, non tender, non distended abd   Extremities: No leg swelling, palpable pulses bilaterally   Neuro: No focal deficits   Data Reviewed: Basic Metabolic Panel:  Recent Labs Lab 03/11/15 1838 03/12/15 0334 03/15/15 0920 03/17/15 0541  NA 135 142 137 139  K 4.3 3.7 3.4* 3.8  CL 103 109 104 106  CO2 _1 GLUCOSE 131* 123* 154* 112*  BUN 22* _2 CREATININE 0.74 0.70 0.58 0.57  CALCIUM 8.6* 8.4* 8.2* 8.3*   Liver Function Tests:  Recent Labs Lab 03/11/15 1838  AST 22  ALT 15  ALKPHOS 62  BILITOT 0.6  PROT 6.4*  ALBUMIN 3.3*   No results for input(s): LIPASE, AMYLASE in the last 168  hours. No results for input(s): AMMONIA in the last 168 hours. CBC:  Recent Labs Lab 03/11/15 1838 03/12/15 0334 03/15/15 0920  WBC 13.0* 11.0* 9.1  NEUTROABS 11.8*  --   --   HGB 12.5 12.6 10.8*  HCT 38.3 39.0 32.4*  MCV 98.2 100.5* 97.9  PLT 157 155 184   Cardiac Enzymes: No results for input(s): CKTOTAL, CKMB, CKMBINDEX, TROPONINI in the last 168 hours. BNP: Invalid input(s): POCBNP CBG: No results for input(s): GLUCAP in the last 168 hours.  Recent Results (from the past 240 hour(s))  Urine culture     Status: None   Collection Time: 03/11/15  7:26 PM  Result Value Ref Range Status   Specimen Description URINE, CATHETERIZED  Final   Special Requests NONE  Final   Culture   Final    NO GROWTH 1 DAY Performed at [facility]    Report Status 03/13/2015 FINAL  Final  MRSA PCR Screening     Status:  None   Collection Time: 03/12/15  1:08 AM  Result Value Ref Range Status   MRSA by PCR NEGATIVE NEGATIVE Final     Scheduled Meds: . calcium-vitamin D  1 tablet Oral Daily  . cholecalciferol  5,000 Units Oral Daily  . donepezil  10 mg Oral Daily  . enoxaparin (LOVENOX) injection  40 mg Subcutaneous Q24H  . ipratropium-albuterol  3 mL Nebulization Q6H  . levothyroxine  100 mcg Oral QAC breakfast  . loratadine  10 mg Oral Daily  . multivitamin with minerals  1 tablet Oral Daily  . omega-3 acid ethyl esters  1 g Oral Daily  . phenytoin  100 mg Oral Daily  . phenytoin  200 mg Oral QHS  . piperacillin-tazobactam (ZOSYN)  IV  3.375 g Intravenous 3 times per day  . saccharomyces boulardii  250 mg Oral BID  . sertraline  25 mg Oral Daily  . vancomycin  750 mg Intravenous Q12H   Continuous Infusions: . sodium chloride 10 mL/hr at 03/15/15 1500

## 2015-03-18 LAB — BASIC METABOLIC PANEL
ANION GAP: 7 (ref 5–15)
BUN: 11 mg/dL (ref 6–20)
CALCIUM: 8.6 mg/dL — AB (ref 8.9–10.3)
CO2: 28 mmol/L (ref 22–32)
Chloride: 104 mmol/L (ref 101–111)
Creatinine, Ser: 0.64 mg/dL (ref 0.44–1.00)
GFR calc non Af Amer: >60 mL/min (ref >60)
Glucose, Bld: 125 mg/dL — ABNORMAL HIGH (ref 65–99)
POTASSIUM: 3.9 mmol/L (ref 3.5–5.1)
Sodium: 139 mmol/L (ref 135–145)

## 2015-03-18 MED ORDER — ZOLPIDEM TARTRATE 5 MG PO TABS
5.0000 mg | ORAL_TABLET | Freq: Every evening | ORAL | Status: DC | PRN
  Administered 2015-03-18: 5 mg via ORAL
  Filled 2015-03-18: qty 1

## 2015-03-18 MED ORDER — DIPHENHYDRAMINE HCL 25 MG PO CAPS: 25.0000 mg | ORAL_CAPSULE | Freq: Every evening | ORAL | Status: DC | PRN

## 2015-03-18 MED ORDER — ACETAMINOPHEN 325 MG PO TABS: 650.0000 mg | ORAL_TABLET | Freq: Every evening | ORAL | Status: DC | PRN

## 2015-03-18 MED ORDER — IPRATROPIUM-ALBUTEROL 0.5-2.5 (3) MG/3ML IN SOLN
3.0000 mL | Freq: Three times a day (TID) | RESPIRATORY_TRACT | Status: DC
  Administered 2015-03-19: 3 mL via RESPIRATORY_TRACT
  Filled 2015-03-18: qty 3

## 2015-03-18 NOTE — Progress Notes (Signed)
Patient Respiratory assessment done. Patient scored an 8. Patient has no previous history other than being a smoker and has no home regimen. decreasing the Treatments to a TID dose of DUO NEB and will reassess in three days or Prn for any changes. Patient X ray showed infiltrates and worsening from previous x rays, with Prior on being Pneumonia. Continuing IS for deep breathing exercises and PO2 for Hypoxia and as needed.

## 2015-03-18 NOTE — Progress Notes (Signed)
Patient ID: Rebecca [name], female   DOB: 1928/01/15, 81 y.o.   MRN: [id number] [facility] PROGRESS NOTE  TEKOA HAMOR BBC:[id number] DOB: Jul 29, 1927 DOA: 03/11/2015 PCP: Estill Dooms, MD  Brief narrative:    81 y.o. femaleresident of the [place] with past medical history of memory loss, depression, dyslipidemia, seizures who presneted to [facility] ED with fevers and worsening mental status changes, confusion started about 1 day prior to this admission. Pt was hypotensive on admission with BP of 80/64, HR 89, RR 26, T max 102.9 F and oxygen saturation of 89% on room air which has improved to 95% with [place] oxygen support. Blood work was notable for WBC count of 13, normal lactic acid and slight elevation in procalcitonin 0.31. CXR showed possible bilateral pneumonia. She was started on vanco and zosyn for treatment of HCAP.   Transferred to medical floor 10/24.  Changed abx to Levaquin 03/17/15.  Barrier to discharge: Pt continues to spike fever. T max in past 24 hours 100.4 F. She will need to be monitored for next 24 hours and if no fevers then d/c 10/31.    Assessment/Plan:    Principal Problem:   Sepsis due to pneumonia ([place]) / Healthcare-associated pneumonia / Bilateral pneumonia / Leukocytosis - Sepsis criteria met on admission with fever, tachypnea, hypoxia, hypotension, leukocytosis and elevated procalcitonin level. Source of infection thought to be pneumonia. CXR on admission showed possible bilateral pneumonia. She was initially on vanco and zosyn and this was changed to [place] 03/17/2015. - Because she continued to spike fevers, most recently overnight with T max 100.4 F we will need to monitor for next 24 hours  - CXR repeated10/28 and it demonstrated worsening asymmetric interstitial and airspace edema or infiltrates, right worse than left. She was given lasix 20 mg IV one time dose 10/29. - CT chest also done 04/11/2023. It demonstrated scattered  right greater than left pulmonary ground-glass opacity and some septal thickening superimposed on suspected chronic lung disease with emphysema and/or a degree of pulmonary fibrosis. Favor atypical infection, especially viral.  - Continue current nebulizer treatments - Continue oxygen support via [place] to keep O2 sats above 90% - Influenza and strep pneumonia are negative.  - Blood cultures negative to date. Urine culture negative.  Active Problems:   Acute respiratory failure with hypoxia (HCC) / interstitial edema  - Due to pneumonia or interstitial edema (CXR on Apr 11, 2023 showed possible interstitial edema or infiltrates) - Stable resp status    Acute encephalopathy / Memory loss - In the setting of sepsis and pneumonia - Continue Aricept 10 mg daily - Stable mental status     Hypothyroidism - Continue synthroid 100 g daily    Depression - Continue zoloft 25 mg daily    Seizure disorder (HCC) - Continue Dilantin 100 mg daily and 200 mg at bedtime - No reports of seizures in hospital     Dyslipidemia - Cntinue omega 3 supplementation   DVT Prophylaxis  - Lovenox subQ ordered while pt in hospital   Code Status: DNR/DNI Family Communication:  plan of care discussed with the patient and her daughter Tressia Miners  Disposition Plan: Anticipated discharge by 03/19/2015. Pt asks to go home. I explained not yet stable because of fever.  IV access:  Peripheral IV  Procedures and diagnostic studies:    Ct Chest Wo Contrast 04-11-15  1. Scattered right greater than left pulmonary ground-glass opacity and some septal thickening superimposed on suspected chronic lung  disease with emphysema and/or a degree of pulmonary fibrosis. Favor atypical infection, especially viral. 2. Small to moderate layering bilateral pleural effusions. Reactive appearing mediastinal lymphadenopathy. 3. Cardiomegaly with no pericardial effusion. Electronically Signed   By: Genevie Ann M.D.   On: 03/16/2015 15:02   Dg Chest  Port 1 View 03/16/2015   1. Worsening asymmetric interstitial and airspace edema or infiltrates, right worse than left. Electronically Signed   By: Lucrezia Europe M.D.   On: 03/16/2015 10:23   Dg Humerus Right 03/16/2015  Negative humerus. Electronically Signed   By: Lucrezia Europe M.D.   On: 03/16/2015 10:24   Dg Chest 2 View 03/11/2015 Patchy bilateral pulmonary infiltrates. Findings are consistent with pneumonia/pneumonitis.  Medical Consultants:  None   Other Consultants:  Physical therapy   IAnti-Infectives:   Vanco 03/11/2015 --> 03/17/2015 Zosyn 03/11/2015 --> 03/17/2015  Levaquin 03/17/2015 -->    Milford Cilento, MD  [facility] Pager [phone]  Time spent in minutes: 25 minutes  If 7PM-7AM, please contact night-coverage [email or web address] Password [email or web address] 03/18/2015, 7:05 AM   LOS: 7 days    HPI/Subjective: No acute overnight events. Spiked fever overnight.    Objective: Filed Vitals:   03/17/15 1505 03/17/15 2119 03/17/15 2133 03/18/15 0600  BP:  118/61  110/61  Pulse:  79  91  Temp:  98.6 F (37 C)  100.4 F (38 C)  TempSrc:  Oral  Oral  Resp:  16  16  Height:      Weight:      SpO2: 95% 96% 97% 96%    Intake/Output Summary (Last 24 hours) at 03/18/15 0705 Last data filed at 03/18/15 0600  Gross per 24 hour  Intake    840 ml  Output   1350 ml  Net   -510 ml    Exam:   General:  Pt is alert, awake, no acute distress  Cardiovascular: (+) S1, S2, rate controlled   Respiratory: coarse breath sounds, no wheezing   Abdomen: non tender abdomen, (+) BS  Extremities: No edema, palpable pulses    Neuro: Nonfocal   Data Reviewed: Basic Metabolic Panel:  Recent Labs Lab 03/11/15 1838 03/12/15 0334 03/15/15 0920 03/17/15 0541 03/18/15 0550  NA 135 142 137 139 139  K 4.3 3.7 3.4* 3.8 3.9  CL 103 109 104 106 104  CO2 _0 GLUCOSE 131* 123* 154* 112* 125*  BUN 22* _1 CREATININE 0.74 0.70 0.58 0.57 0.64  CALCIUM 8.6* 8.4*  8.2* 8.3* 8.6*   Liver Function Tests:  Recent Labs Lab 03/11/15 1838  AST 22  ALT 15  ALKPHOS 62  BILITOT 0.6  PROT 6.4*  ALBUMIN 3.3*   No results for input(s): LIPASE, AMYLASE in the last 168 hours. No results for input(s): AMMONIA in the last 168 hours. CBC:  Recent Labs Lab 03/11/15 1838 03/12/15 0334 03/15/15 0920  WBC 13.0* 11.0* 9.1  NEUTROABS 11.8*  --   --   HGB 12.5 12.6 10.8*  HCT 38.3 39.0 32.4*  MCV 98.2 100.5* 97.9  PLT 157 155 184   Cardiac Enzymes: No results for input(s): CKTOTAL, CKMB, CKMBINDEX, TROPONINI in the last 168 hours. BNP: Invalid input(s): POCBNP CBG: No results for input(s): GLUCAP in the last 168 hours.  Recent Results (from the past 240 hour(s))  Urine culture     Status: None   Collection Time: 03/11/15  7:26 PM  Result Value Ref Range Status   Specimen Description  URINE, CATHETERIZED  Final   Special Requests NONE  Final   Culture   Final    NO GROWTH 1 DAY Performed at [facility]    Report Status 03/13/2015 FINAL  Final  MRSA PCR Screening     Status: None   Collection Time: 03/12/15  1:08 AM  Result Value Ref Range Status   MRSA by PCR NEGATIVE NEGATIVE Final     Scheduled Meds: . calcium-vitamin D  1 tablet Oral Daily  . cholecalciferol  5,000 Units Oral Daily  . donepezil  10 mg Oral Daily  . enoxaparin (LOVENOX) injection  40 mg Subcutaneous Q24H  . ipratropium-albuterol  3 mL Nebulization Q6H  . levofloxacin (LEVAQUIN) IV  750 mg Intravenous Q48H  . levothyroxine  100 mcg Oral QAC breakfast  . loratadine  10 mg Oral Daily  . multivitamin with minerals  1 tablet Oral Daily  . omega-3 acid ethyl esters  1 g Oral Daily  . phenytoin  100 mg Oral Daily  . phenytoin  200 mg Oral QHS  . saccharomyces boulardii  250 mg Oral BID  . sertraline  25 mg Oral Daily   Continuous Infusions: . sodium chloride 10 mL/hr at 03/15/15 1500

## 2015-03-19 MED ORDER — LEVOFLOXACIN 750 MG PO TABS: 750.0000 mg | ORAL_TABLET | ORAL | Status: DC

## 2015-03-19 NOTE — Discharge Summary (Addendum)
Physician Discharge Summary  Rebecca [name] URK:[id number] DOB: Sep 20, 1927 DOA: 03/11/2015  PCP: Estill Dooms, MD  Admit date: 03/11/2015 Discharge date: 03/19/2015  Recommendations for Outpatient Follow-up:  1. Please continue Levaquin every other day for total of 4 doses on discharge. 2. Continue oxygen 5 L [place] on discharge.   Discharge Diagnoses:  Principal Problem:   Sepsis due to pneumonia [facility]) Active Problems:   Healthcare-associated pneumonia   Acute respiratory failure with hypoxia ([place])   Acute encephalopathy   Hypothyroidism   Memory loss   Depression   Leukocytosis   Seizure disorder ([place])   Dyslipidemia    Discharge Condition: stable; pt insists on going home today - friends home [place].   Diet recommendation: as tolerated   History of present illness:  81 y.o. femaleresident of the [place] with past medical history of memory loss, depression, dyslipidemia, seizures who presneted to [facility] ED with fevers and worsening mental status changes, confusion started about 1 day prior to this admission. Pt was hypotensive on admission with BP of 80/64, HR 89, RR 26, T max 102.9 F and oxygen saturation of 89% on room air which has improved to 95% with [place] oxygen support. Blood work was notable for WBC count of 13, normal lactic acid and slight elevation in procalcitonin 0.31. CXR showed possible bilateral pneumonia. She was started on vanco and zosyn for treatment of HCAP.   Transferred to medical floor 10/24.  Changed abx to Levaquin 03/17/15.  Hospital Course:   Assessment/Plan:    Principal Problem:  Sepsis due to pneumonia ([place]) / Healthcare-associated pneumonia / Bilateral pneumonia / Leukocytosis / Acute respiratory failure with hypoxia (HCC) / Interstitial edema  - Sepsis criteria met on admission with fever, tachypnea, hypoxia, hypotension, leukocytosis and elevated procalcitonin level. Source of infection thought to be pneumonia.  CXR on admission showed possible bilateral pneumonia.  - Initially on vanco and zosyn and this was changed to [place] 03/17/2015. - No fevers in past 24 hours.  - CXR repeated 10/28 and it demonstrated worsening asymmetric interstitial and airspace edema or infiltrates, right worse than left. She was given lasix 20 mg IV one time dose 10/29. - CT chest also done 10/28. It demonstrated scattered right greater than left pulmonary ground-glass opacity and some septal thickening superimposed on suspected chronic lung disease with emphysema and/or a degree of pulmonary fibrosis. Favor atypical infection, especially viral.  - Influenza and strep pneumonia are negative.  - Blood cultures negative to date. Urine culture negative. - Stable respiratory status   Active Problems:  Acute encephalopathy / Memory loss - In the setting of sepsis and pneumonia - Continue Aricept 10 mg daily   Hypothyroidism - Continue synthroid 100 g daily   Depression - Continue zoloft 25 mg daily   Seizure disorder (HCC) - Continue Dilantin 100 mg daily and 200 mg at bedtime on discharge    Dyslipidemia - Cntinue omega 3 supplementation   DVT Prophylaxis  - Lovenox subQ  Code Status: DNR/DNI Family Communication: plan of care discussed with the patient and her daughter Tressia Miners   IV access:  Peripheral IV  Procedures and diagnostic studies:   Ct Chest Wo Contrast 03/16/2015 1. Scattered right greater than left pulmonary ground-glass opacity and some septal thickening superimposed on suspected chronic lung disease with emphysema and/or a degree of pulmonary fibrosis. Favor atypical infection, especially viral. 2. Small to moderate layering bilateral pleural effusions. Reactive appearing mediastinal lymphadenopathy. 3. Cardiomegaly with no pericardial  effusion. Electronically Signed By: Genevie Ann M.D. On: 03/16/2015 15:02   Dg Chest Port 1 View 03/16/2015 1. Worsening asymmetric interstitial  and airspace edema or infiltrates, right worse than left. Electronically Signed By: Lucrezia Europe M.D. On: 03/16/2015 10:23   Dg Humerus Right 03/16/2015 Negative humerus. Electronically Signed By: Lucrezia Europe M.D. On: 03/16/2015 10:24   Dg Chest 2 View 03/11/2015 Patchy bilateral pulmonary infiltrates. Findings are consistent with pneumonia/pneumonitis.  Medical Consultants:  None   Other Consultants:  Physical therapy   IAnti-Infectives:   Vanco 03/11/2015 --> 03/17/2015 Zosyn 03/11/2015 --> 03/17/2015  Levaquin 03/17/2015 --> for 4 doses on discharge every other day  Signed:  Leisa Lenz, MD  [facility] 03/19/2015, 8:58 AM  Pager #: [phone]  Time spent in minutes: more than 30 minutes  Discharge Exam: Filed Vitals:   03/19/15 0530  BP: 116/58  Pulse: 88  Temp: 98.7 F (37.1 C)  Resp: 20   Filed Vitals:   03/18/15 1900 03/18/15 2115 03/19/15 0530 03/19/15 0639  BP:   116/58   Pulse: 89  88   Temp:   98.7 F (37.1 C)   TempSrc:   Oral   Resp: 20  20   Height:      Weight:      SpO2: 91% 92% 85% 91%    General: Pt is alert, follows commands appropriately, not in acute distress Cardiovascular: Regular rate and rhythm, S1/S2 +, no murmurs Respiratory: No wheezing, no crackles, no rhonchi Abdominal: Soft, non tender, non distended, bowel sounds +, no guarding Extremities: no edema, no cyanosis, pulses palpable bilaterally DP and PT Neuro: Grossly nonfocal  Discharge Instructions  Discharge Instructions    Call MD for:  difficulty breathing, headache or visual disturbances    Complete by:  As directed      Call MD for:  difficulty breathing, headache or visual disturbances    Complete by:  As directed      Call MD for:  persistant dizziness or light-headedness    Complete by:  As directed      Call MD for:  persistant dizziness or light-headedness    Complete by:  As directed      Call MD for:  persistant nausea and vomiting     Complete by:  As directed      Call MD for:  persistant nausea and vomiting    Complete by:  As directed      Call MD for:  severe uncontrolled pain    Complete by:  As directed      Call MD for:  severe uncontrolled pain    Complete by:  As directed      Diet - low sodium heart healthy    Complete by:  As directed      Diet - low sodium heart healthy    Complete by:  As directed      Discharge instructions    Complete by:  As directed        Discharge instructions    Complete by:  As directed   1. Please continue Levaquin every other day for total of 4 doses on discharge.     Increase activity slowly    Complete by:  As directed      Increase activity slowly    Complete by:  As directed             Medication List    STOP taking these medications  amoxicillin 500 MG capsule  Commonly known as:  AMOXIL     diphenhydramine-acetaminophen 25-500 MG Tabs tablet  Commonly known as:  TYLENOL PM     nitrofurantoin (macrocrystal-monohydrate) 100 MG capsule  Commonly known as:  MACROBID      TAKE these medications        acetaminophen 650 MG CR tablet  Commonly known as:  TYLENOL  Take 650 mg by mouth daily as needed for pain.     CALCIUM 600+D 600-400 MG-UNIT tablet  Generic drug:  Calcium Carbonate-Vitamin D  Take 1 tablet by mouth daily.     clonazePAM 0.5 MG tablet  Commonly known as:  KLONOPIN  Take 1 tablet (0.5 mg total) by mouth 2 (two) times daily as needed (anxiety).     donepezil 10 MG tablet  Commonly known as:  ARICEPT  Take 10 mg by mouth daily.     guaiFENesin-dextromethorphan 100-10 MG/5ML syrup  Commonly known as:  ROBITUSSIN DM  Take 5 mLs by mouth every 4 (four) hours as needed for cough.     KLS ALLER-TEC 10 MG tablet  Generic drug:  cetirizine  Take 10 mg by mouth daily as needed for allergies.     levofloxacin 750 MG tablet  Commonly known as:  LEVAQUIN  Take 1 tablet (750 mg total) by mouth every other day.     levothyroxine  100 MCG tablet  Commonly known as:  SYNTHROID, LEVOTHROID  Take 1 tablet (100 mcg total) by mouth daily before breakfast. For thyroid     meloxicam 15 MG tablet  Commonly known as:  MOBIC  TAKE 1 TABLET BY MOUTH ONCE DAILY TO HELP ARTHRITIS     MULTIVITAMINS PO  Take 1 tablet by mouth daily.     omega-3 acid ethyl esters 1 G capsule  Commonly known as:  LOVAZA  Take 1 g by mouth daily. Fatty Acid one daily     ondansetron 4 MG tablet  Commonly known as:  ZOFRAN  Take 1 tablet (4 mg total) by mouth every 6 (six) hours as needed for nausea.     phenytoin 100 MG ER capsule  Commonly known as:  DILANTIN  Take 1 capsule by mouth in  the morning and 2 capsules  by mouth at bedtime to prevent seizure.     saccharomyces boulardii 250 MG capsule  Commonly known as:  FLORASTOR  Take 250 mg by mouth 2 (two) times daily.     sertraline 50 MG tablet  Commonly known as:  ZOLOFT  One daily to improve anxiety and depression     traMADol 50 MG tablet  Commonly known as:  ULTRAM  Take 1 tablet (50 mg total) by mouth every 6 (six) hours as needed for moderate pain or severe pain.     Vitamin D-3 5000 UNITS Tabs  Take 5,000 Units by mouth daily.           Follow-up Information    Follow up with GREEN, Viviann Spare, MD. Schedule an appointment as soon as possible for a visit in 1 week.   Specialty:  Internal Medicine   Why:  Follow up appt after recent hospitalization   Contact information:   [address] [place] [address] [phone]        The results of significant diagnostics from this hospitalization (including imaging, microbiology, ancillary and laboratory) are listed below for reference.    Significant Diagnostic Studies: Dg Chest 2 View  03/11/2015  CLINICAL DATA:  Sepsis. Fever. Altered mental status. Urinary tract infection. EXAM: CHEST  2 VIEW COMPARISON:  Chest x-rays dated 07/25/2013 and 01/28/2011 and 01/22/2011 FINDINGS: Heart size and pulmonary  vascularity are normal. The patient has developed patchy bilateral peripheral densities which are similar to those present in September 2012. No effusions. No acute osseous abnormality. IMPRESSION: Patchy bilateral pulmonary infiltrates. Findings are consistent with pneumonia/pneumonitis. Electronically Signed   By: Lorriane Shire M.D.   On: 03/11/2015 18:54   Ct Chest Wo Contrast  03/16/2015  CLINICAL DATA:  81 year old female with shortness of Breath, fever, altered mental status. Worsening lung opacity on chest radiographs. Initial encounter. EXAM: CT CHEST WITHOUT CONTRAST TECHNIQUE: Multidetector CT imaging of the chest was performed following the standard protocol without IV contrast. COMPARISON:  Portable chest 0939 hours today, and earlier. FINDINGS: Cardiomegaly. Small to moderate layering bilateral pleural effusions. No pericardial effusion. Calcified atherosclerosis of the aorta and coronary arteries. Tortuous thoracic aorta and proximal great vessels. Major airways are patent aside from atelectatic changes. Widespread and confluent pulmonary ground-glass opacity in the lungs right greater than left, with associated interstitial thickening. Areas of sparing also demonstrate attenuated vasculature suggestive of air trapping. This may reflect underlying emphysema. No areas of consolidation. Several calcified probable granulomas in the right upper and mid lung. Furthermore, there is fine reticulonodular calcification at the lung bases which is evident on multiple prior chest x-rays, suggesting alveolar microlithiasis. Mild AP window and right peritracheal lymph node enlargement (up to 13 mm) appears reactive in nature. No axillary lymphadenopathy. Visualized noncontrast liver, spleen, pancreas, and bowel in the upper abdomen are within normal limits. There is thickening of the left adrenal gland probably due to adrenal hyperplasia. Grossly negative visualized left kidney. Diffuse spinal disc and endplate  degeneration, severe in the lower thoracic levels with extensive vacuum phenomena. No acute osseous abnormality identified. IMPRESSION: 1. Scattered right greater than left pulmonary ground-glass opacity and some septal thickening superimposed on suspected chronic lung disease with emphysema and/or a degree of pulmonary fibrosis. Favor atypical infection, especially viral. 2. Small to moderate layering bilateral pleural effusions. Reactive appearing mediastinal lymphadenopathy. 3. Cardiomegaly with no pericardial effusion. Electronically Signed   By: Genevie Ann M.D.   On: 03/16/2015 15:02   Dg Chest Port 1 View  03/16/2015  CLINICAL DATA:  Sob; patient also having right elbow pain, especially when abducting right arm; no injury EXAM: PORTABLE CHEST - 1 VIEW COMPARISON:  03/11/2015 FINDINGS: Coarse interstitial opacities throughout both lungs right greater than left, increased since previous exam. Some increase in bibasilar airspace opacities right greater than left, and new right suprahilar airspace disease. No pneumothorax. Blunting of lateral costophrenic angles suggesting small effusions. Spondylitic changes in the thoracolumbar spine a lumbar dextroscoliosis. IMPRESSION: 1. Worsening asymmetric interstitial and airspace edema or infiltrates, right worse than left. Electronically Signed   By: Lucrezia Europe M.D.   On: 03/16/2015 10:23   Dg Humerus Right  03/16/2015  CLINICAL DATA:  Sob; patient also having right elbow pain, especially when abducting right arm; no injury EXAM: RIGHT HUMERUS - 2+ VIEW COMPARISON:  None. FINDINGS: There is no evidence of fracture or other focal bone lesions. Soft tissues are unremarkable. Small acromioclavicular spurs. IMPRESSION: Negative humerus. Electronically Signed   By: Lucrezia Europe M.D.   On: 03/16/2015 10:24    Microbiology: Recent Results (from the past 240 hour(s))  Blood Culture (routine x 2)     Status: None   Collection Time: 03/11/15  6:30 PM  Result  Value Ref  Range Status   Specimen Description RIGHT ANTECUBITAL  Final   Special Requests BOTTLES DRAWN AEROBIC AND ANAEROBIC 5CC  Final   Culture   Final    NO GROWTH 5 DAYS Performed at [facility]    Report Status 03/17/2015 FINAL  Final  Blood Culture (routine x 2)     Status: None   Collection Time: 03/11/15  6:35 PM  Result Value Ref Range Status   Specimen Description BLOOD RIGHT FOREARM  Final   Special Requests BOTTLES DRAWN AEROBIC AND ANAEROBIC 5CC  Final   Culture   Final    NO GROWTH 5 DAYS Performed at [facility]    Report Status 03/17/2015 FINAL  Final  Urine culture     Status: None   Collection Time: 03/11/15  7:26 PM  Result Value Ref Range Status   Specimen Description URINE, CATHETERIZED  Final   Special Requests NONE  Final   Culture   Final    NO GROWTH 1 DAY Performed at [facility]    Report Status 03/13/2015 FINAL  Final  MRSA PCR Screening     Status: None   Collection Time: 03/12/15  1:08 AM  Result Value Ref Range Status   MRSA by PCR NEGATIVE NEGATIVE Final    Comment:        The GeneXpert MRSA Assay (FDA approved for NASAL specimens only), is one component of a comprehensive MRSA colonization surveillance program. It is not intended to diagnose MRSA infection nor to guide or monitor treatment for MRSA infections.      Labs: Basic Metabolic Panel:  Recent Labs Lab 03/15/15 0920 03/17/15 0541 03/18/15 0550  NA 137 139 139  K 3.4* 3.8 3.9  CL 104 106 104  CO2 '26 27 28  ' GLUCOSE 154* 112* 125*  BUN '11 13 11  ' CREATININE 0.58 0.57 0.64  CALCIUM 8.2* 8.3* 8.6*   Liver Function Tests: No results for input(s): AST, ALT, ALKPHOS, BILITOT, PROT, ALBUMIN in the last 168 hours. No results for input(s): LIPASE, AMYLASE in the last 168 hours. No results for input(s): AMMONIA in the last 168 hours. CBC:  Recent Labs Lab 03/15/15 0920  WBC 9.1  HGB 10.8*  HCT 32.4*  MCV 97.9  PLT 184   Cardiac Enzymes: No results  for input(s): CKTOTAL, CKMB, CKMBINDEX, TROPONINI in the last 168 hours. BNP: BNP (last 3 results) No results for input(s): BNP in the last 8760 hours.  ProBNP (last 3 results) No results for input(s): PROBNP in the last 8760 hours.  CBG: No results for input(s): GLUCAP in the last 168 hours.

## 2015-03-19 NOTE — Progress Notes (Signed)
Occupational Therapy Treatment Patient Details Name: MARIELI RUDY MRN: [id number] DOB: 11-19-1927 Today's Date: 03/19/2015    History of present illness Jerrica SHAUNI HENNER is a 81 y.o. female resident of the [facility] who was sent to the ED 10/23/16due to FEvers to 103.5 and confusion.she was  found to have hypoxia with O2 sats to 83% Bilateral pneumonia on Chest X-ray    OT comments  Much of session focused on deep breathing techniques and relaxation-  RN aware. Pt had taken oxygen off earlier and sats decreased.   Pt agreed to get to chair and focus on deep breathing  Follow Up Recommendations  SNF    Equipment Recommendations  None recommended by OT    Recommendations for Other Services      Precautions / Restrictions         Mobility Bed Mobility   Bed Mobility: Supine to Sit     Supine to sit: Min assist     General bed mobility comments: improved to mobilize self, extra time, DOE, rest breaks. got to bed edge on L and R during treatment session. gets into bed without assistance  Transfers Overall transfer level: Needs assistance Equipment used: Rolling walker (2 wheeled)   Sit to Stand: Min assist Stand pivot transfers: Min assist                ADL Overall ADL's : Needs assistance/impaired Eating/Feeding: Set up                       Toilet Transfer: Minimal assistance;BSC   Toileting- Clothing Manipulation and Hygiene: Minimal assistance;Sit to/from stand         General ADL Comments: Pts sats decreased per RN as she has taken oxygen off. much of OT session focused on deep breathing tecniques and maintain sats greater than 90                              Frequency Min 2X/week     Progress Toward Goals  OT Goals(current goals can now be found in the care plan section)  Progress towards OT goals: Progressing toward goals     Plan Discharge plan remains appropriate       End of Session      Activity Tolerance Patient limited by fatigue   Patient Left in chair;with call bell/phone within reach;with chair alarm set   Nurse Communication Mobility status        Time: 6553-7482 OT Time Calculation (min): 27 min  Charges: OT General Charges $OT Visit: 1 Procedure OT Treatments $Self Care/Home Management : 23-37 mins  [place] Liz, Thereasa Parkin 03/19/2015, 12:13 PM

## 2015-03-19 NOTE — Progress Notes (Signed)
Report called and given to SNF. Facility notified patient on 5L of Oxygen. Patient left via ambulance with family at bedside. All discharge paperwork given to driver.

## 2015-03-19 NOTE — Progress Notes (Signed)
Patient for d/c today to SNF bed at [facility]. Son and patient agreeable to this plan- plan transfer via EMS. Eduard Clos, MSW, [place]

## 2015-03-19 NOTE — Progress Notes (Signed)
RT found Pt on 1.5L [place] with Sp02 = 78. RN notified. Pt now on 3L 

## 2015-03-20 ENCOUNTER — Encounter: Payer: Self-pay | Admitting: Nurse Practitioner

## 2015-03-20 ENCOUNTER — Non-Acute Institutional Stay (SKILLED_NURSING_FACILITY): Payer: Medicare Other | Admitting: Nurse Practitioner

## 2015-03-20 DIAGNOSIS — R413 Other amnesia: Secondary | ICD-10-CM

## 2015-03-20 DIAGNOSIS — F329 Major depressive disorder, single episode, unspecified: Secondary | ICD-10-CM | POA: Diagnosis not present

## 2015-03-20 DIAGNOSIS — A419 Sepsis, unspecified organism: Secondary | ICD-10-CM

## 2015-03-20 DIAGNOSIS — G40909 Epilepsy, unspecified, not intractable, without status epilepticus: Secondary | ICD-10-CM | POA: Diagnosis not present

## 2015-03-20 DIAGNOSIS — J9601 Acute respiratory failure with hypoxia: Secondary | ICD-10-CM

## 2015-03-20 DIAGNOSIS — G9340 Encephalopathy, unspecified: Secondary | ICD-10-CM

## 2015-03-20 DIAGNOSIS — E039 Hypothyroidism, unspecified: Secondary | ICD-10-CM

## 2015-03-20 DIAGNOSIS — J189 Pneumonia, unspecified organism: Secondary | ICD-10-CM

## 2015-03-20 DIAGNOSIS — F32A Depression, unspecified: Secondary | ICD-10-CM

## 2015-03-20 LAB — LEGIONELLA PNEUMOPHILA SEROGP 1 UR AG: L. pneumophila Serogp 1 Ur Ag: NEGATIVE

## 2015-03-20 NOTE — Assessment & Plan Note (Signed)
Sepsis due to pneumonia ([place]) / Healthcare-associated pneumonia / Bilateral pneumonia / Leukocytosis / Acute respiratory failure with hypoxia (HCC) / Interstitial edema, complete Levaquin at [facility]

## 2015-03-20 NOTE — Assessment & Plan Note (Signed)
Last seizure 2010 02/01/15 Dilantin lever 12.4(10-20) Continue Dilantin '100mg'$  daily

## 2015-03-20 NOTE — Assessment & Plan Note (Signed)
Continue Levothyroxine 170mg 01/18/15 TSH 1.723

## 2015-03-20 NOTE — Assessment & Plan Note (Signed)
Continue O2, may wean off it.

## 2015-03-20 NOTE — Assessment & Plan Note (Signed)
Resolved

## 2015-03-20 NOTE — Assessment & Plan Note (Signed)
Sleeps well, mood is stable, continue Sertraline '25mg'$  daily, better with  tremor, lip smacking, c/o fatigue and dizziness since Sertraline reduced to '25mg'$ .

## 2015-03-20 NOTE — Assessment & Plan Note (Signed)
Acute encephalopathy / Memory loss - In the setting of sepsis and pneumonia - Continue Aricept 10 mg daily

## 2015-03-20 NOTE — Progress Notes (Signed)
Patient ID: Rebecca [name], female   DOB: 1928/02/26, 81 y.o.   MRN: [id number]  Location:  SNF [facility] Provider:  Marlana Latus NP  Code Status:  DNR Goals of care: Advanced Directive information    Chief Complaint  Patient presents with  . Medical Management of Chronic Issues  . Acute Visit  . Hospitalization Follow-up    PNA, sepsis     HPI: Patient is a 81 y.o. female seen in the SNF at [facility] today for evaluation of f/u sepsis due to PNA, Hx of  Hypothyroidism, Memory loss, Depression, Seizure disorder ([place]), Dyslipidemia  Hospitalized from 03/11/2015-03/19/2015 for sepsis due to PNA,  will complete Levaquin every other day for total of 4 doses in SNF. Continue oxygen 5 L [place] on discharge. She presented to ED with T 102.9, Bp 80/64, RR 26, Sat O2 89%, and altered mentation. CXR showed possible bilateral pneumonia. She was started on vanco and zosyn for treatment of HCAP.        Review of Systems:  Review of Systems  Constitutional: Positive for malaise/fatigue. Negative for diaphoresis.  HENT: Positive for hearing loss. Negative for congestion, ear discharge, ear pain, nosebleeds, sore throat and tinnitus.        Bilateral hearing loss. June 2015 ruptured right Tm with drainage, but little pain.  Eyes: Negative.   Respiratory:       History of dyspnea on exertion  Cardiovascular: Negative.   Gastrointestinal:       Blood noted in stool and toilet paper occasionally.  Genitourinary: Positive for frequency.       2-3x/night  Musculoskeletal:       Osteoarthritis with complaints of pain in multiple areas. Neck and right shoulder pains. Sharp pain in the right shoulder and arm when she abducts and rotated at the shoulder  Skin:       Senile ecchymoses  Neurological: Positive for dizziness and tremors. Negative for tingling, sensory change, speech change and focal weakness.       Hx seizures.   Endo/Heme/Allergies: Negative for environmental allergies and polydipsia.    Psychiatric/Behavioral: Positive for suicidal ideas. The patient has insomnia.        Chronic anxiety. Memory deficits.    Past Medical History  Diagnosis Date  . Osteoporosis 02/2011  . Seizures ([place])   . DJD (degenerative joint disease)   . Diverticulosis 02/2011  . Hemorrhoids 02/2011  . Dizziness   . Low back pain   . Mitral valve problem thickened    thickened  . Unspecified hypothyroidism 01/2011  . 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 Vitamin D deficiency 01/2011  . Anemia, unspecified 03/18/2011  . Anxiety state, unspecified 01/2011  . 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 Pain in joint, site unspecified 01/2012  . Sacroiliitis, not elsewhere classified ([place]) 05/2011  . Dysuria 06/17/2011  . Other abnormal blood chemistry 0/30/2012  . Blood in stool 06/15/2012  . Other acquired deformity of toe 12/16/2011  . Corns and callosities 07/01/2011    Patient Active Problem List   Diagnosis Date Noted  . Acute respiratory failure with hypoxia ([place]) 03/12/2015  . Dyslipidemia 03/12/2015  . Sepsis due to pneumonia ([place]) 03/12/2015  . Acute encephalopathy 03/12/2015  . Healthcare-associated pneumonia 03/11/2015  . Leukocytosis 03/11/2015  . Seizure disorder ([place]) 03/11/2015  . Depression 09/26/2014  . Memory loss 09/19/2012  . Hypothyroidism 12/08/2006    Allergies  Allergen Reactions  . Morphine  And Related Other (See Comments)    Altered mental state   . Sulfa Antibiotics Other (See Comments)    Reaction: unknown    Medications: Patient's Medications  New Prescriptions   No medications on file  Previous Medications   ACETAMINOPHEN (TYLENOL) 650 MG CR TABLET    Take 650 mg by mouth daily as needed for pain.   CALCIUM CARBONATE-VITAMIN D (CALCIUM 600+D) 600-400 MG-UNIT TABLET    Take 1 tablet by mouth daily.   CETIRIZINE (KLS ALLER-TEC) 10 MG TABLET    Take 10 mg by mouth daily as needed for allergies.     CHOLECALCIFEROL (VITAMIN D-3) 5000 UNITS TABS    Take 5,000 Units by mouth daily.    CLONAZEPAM (KLONOPIN) 0.5 MG TABLET    Take 1 tablet (0.5 mg total) by mouth 2 (two) times daily as needed (anxiety).   DONEPEZIL (ARICEPT) 10 MG TABLET    Take 10 mg by mouth daily.   GUAIFENESIN-DEXTROMETHORPHAN (ROBITUSSIN DM) 100-10 MG/5ML SYRUP    Take 5 mLs by mouth every 4 (four) hours as needed for cough.   LEVOFLOXACIN (LEVAQUIN) 750 MG TABLET    Take 1 tablet (750 mg total) by mouth every other day.   LEVOTHYROXINE (SYNTHROID, LEVOTHROID) 100 MCG TABLET    Take 1 tablet (100 mcg total) by mouth daily before breakfast. For thyroid   MELOXICAM (MOBIC) 15 MG TABLET    TAKE 1 TABLET BY MOUTH ONCE DAILY TO HELP ARTHRITIS   MULTIPLE VITAMIN (MULTIVITAMINS PO)    Take 1 tablet by mouth daily.    OMEGA-3 ACID ETHYL ESTERS (LOVAZA) 1 G CAPSULE    Take 1 g by mouth daily. Fatty Acid one daily   ONDANSETRON (ZOFRAN) 4 MG TABLET    Take 1 tablet (4 mg total) by mouth every 6 (six) hours as needed for nausea.   PHENYTOIN (DILANTIN) 100 MG ER CAPSULE    Take 1 capsule by mouth in  the morning and 2 capsules  by mouth at bedtime to prevent seizure.   SACCHAROMYCES BOULARDII (FLORASTOR) 250 MG CAPSULE    Take 250 mg by mouth 2 (two) times daily.   SERTRALINE (ZOLOFT) 50 MG TABLET    One daily to improve anxiety and depression   TRAMADOL (ULTRAM) 50 MG TABLET    Take 1 tablet (50 mg total) by mouth every 6 (six) hours as needed for moderate pain or severe pain.  Modified Medications   No medications on file  Discontinued Medications   No medications on file    Physical Exam: There were no vitals filed for this visit. There is no weight on file to calculate BMI.  Physical Exam  Constitutional: She is oriented to person, place, and time. She appears well-developed and well-nourished. No distress.  HENT:  Bilateral hearing loss. Rupture of the right TM.   Eyes: Conjunctivae and EOM are normal. Pupils are equal,  round, and reactive to light.  Lens implants in both eyes  Neck: Normal range of motion. Neck supple. No JVD present. No tracheal deviation present. No thyromegaly present.  Cardiovascular: Normal rate, regular rhythm, normal heart sounds and intact distal pulses.  Exam reveals no gallop and no friction rub.   No murmur heard. Pulmonary/Chest: Effort normal and breath sounds normal. No respiratory distress. She has no wheezes. She exhibits no tenderness.  Abdominal: Soft. Bowel sounds are normal. She exhibits no distension and no mass. There is no tenderness.  Genitourinary: Guaiac negative stool.  From previous exam:  Hemorrhoids. Normal sphincter tone. Stools are normal brown color.  Musculoskeletal: Normal range of motion. She exhibits no edema or tenderness.  Tender in the posterior neck. Mild restriction in movements. Hammertoes at the left second and third. Right second toe overlaps the great toe. SP surgical repair of the right 2nd toe.  Shoulders sore. Painful in the right shoulder when she lifts overhead and rotates at the shpoulder.[facility] July 2014. Unstable gait. Using walker with 2 front wheels and rear skids. Weaker grip of the right hand.. Tender in the left SI joint.  Lymphadenopathy:    She has no cervical adenopathy.  Neurological: She is alert and oriented to person, place, and time. No cranial nerve deficit. Coordination normal.  No loss of grip strength. Loss of memory. 04/04/14 MMSE 25/30. Passed clock drawing. 07/04/14 MMSE 28/30. Failed clock drawing. Bilateral tremor.  Skin: Skin is warm and dry. No rash noted. No erythema. No pallor.  Psychiatric: She has a normal mood and affect. Her behavior is normal. Thought content normal.    Labs reviewed: Basic Metabolic Panel:  Recent Labs  03/15/15 0920 03/17/15 0541 03/18/15 0550  NA 137 139 139  K 3.4* 3.8 3.9  CL 104 106 104  CO2 '26 27 28  '$ GLUCOSE 154* 112* 125*  BUN '11 13 11  '$ CREATININE 0.58 0.57 0.64    CALCIUM 8.2* 8.3* 8.6*    Liver Function Tests:  Recent Labs  06/29/14 03/08/15 03/11/15 1838  AST '19 20 22  '$ ALT '10 13 15  '$ ALKPHOS 80 74 62  BILITOT  --   --  0.6  PROT  --   --  6.4*  ALBUMIN  --   --  3.3*    CBC:  Recent Labs  01/11/15 1634  03/11/15 1838 03/12/15 0334 03/15/15 0920  WBC 5.5  < > 13.0* 11.0* 9.1  NEUTROABS 3.6  --  11.8*  --   --   HGB 12.8  < > 12.5 12.6 10.8*  HCT 38.7  < > 38.3 39.0 32.4*  MCV 99.5  < > 98.2 100.5* 97.9  PLT 195  < > 157 155 184  < > = values in this interval not displayed.  Lab Results  Component Value Date   TSH 1.47 03/08/2015   Lab Results  Component Value Date   HGBA1C 5.4 03/08/2015   Lab Results  Component Value Date   CHOL 235* 03/06/2009   HDL 108.40 03/06/2009   LDLDIRECT 108.2 03/06/2009    Significant Diagnostic Results since last visit: none  Patient Care Team: Estill Dooms, MD as PCP - General (Internal Medicine) [facility] [name], MD as Consulting Physician (Orthopedic Surgery) [name], MD as Consulting Physician (Physical Medicine and Rehabilitation) Melina Schools, MD as Consulting Physician (Orthopedic Surgery) [name], MD as Consulting Physician (Otolaryngology)  Assessment/Plan Problem List Items Addressed This Visit    Acute encephalopathy    Resolved.       Acute respiratory failure with hypoxia (HCC)    Continue O2, may wean off it.       Depression    Sleeps well, mood is stable, continue Sertraline '25mg'$  daily, better with  tremor, lip smacking, c/o fatigue and dizziness since Sertraline reduced to '25mg'$ .       Hypothyroidism (Chronic)    Continue Levothyroxine 138mg 01/18/15 TSH 1.723      Memory loss (Chronic)     Acute encephalopathy / Memory loss - In the setting of sepsis and pneumonia -  Continue Aricept 10 mg daily      Seizure disorder ([place])    Last seizure 2010 02/01/15 Dilantin lever 12.4(10-20) Continue Dilantin '100mg'$  daily      Sepsis  due to pneumonia ([place]) - Primary    Sepsis due to pneumonia ([place]) / Healthcare-associated pneumonia / Bilateral pneumonia / Leukocytosis / Acute respiratory failure with hypoxia (HCC) / Interstitial edema, complete Levaquin at [facility]          Family/ staff Communication: goal is to return to AL when able, here for rehab  Labs/tests ordered: none  [facility] Mast NP [facility] [address], [place] [address] On Call:  [phone] & follow prompts after 5pm & weekends Office Phone:  [phone] Office Fax:  [phone]

## 2015-03-22 ENCOUNTER — Non-Acute Institutional Stay (SKILLED_NURSING_FACILITY): Payer: Medicare Other | Admitting: Internal Medicine

## 2015-03-22 DIAGNOSIS — F329 Major depressive disorder, single episode, unspecified: Secondary | ICD-10-CM

## 2015-03-22 DIAGNOSIS — R413 Other amnesia: Secondary | ICD-10-CM

## 2015-03-22 DIAGNOSIS — G40909 Epilepsy, unspecified, not intractable, without status epilepticus: Secondary | ICD-10-CM

## 2015-03-22 DIAGNOSIS — F32A Depression, unspecified: Secondary | ICD-10-CM

## 2015-03-22 DIAGNOSIS — G9340 Encephalopathy, unspecified: Secondary | ICD-10-CM | POA: Diagnosis not present

## 2015-03-22 DIAGNOSIS — J189 Pneumonia, unspecified organism: Secondary | ICD-10-CM | POA: Diagnosis not present

## 2015-03-22 DIAGNOSIS — J9601 Acute respiratory failure with hypoxia: Secondary | ICD-10-CM

## 2015-03-23 ENCOUNTER — Non-Acute Institutional Stay (SKILLED_NURSING_FACILITY): Payer: Medicare Other | Admitting: Nurse Practitioner

## 2015-03-23 DIAGNOSIS — R413 Other amnesia: Secondary | ICD-10-CM | POA: Diagnosis not present

## 2015-03-23 DIAGNOSIS — G40909 Epilepsy, unspecified, not intractable, without status epilepticus: Secondary | ICD-10-CM | POA: Diagnosis not present

## 2015-03-23 DIAGNOSIS — J189 Pneumonia, unspecified organism: Secondary | ICD-10-CM | POA: Diagnosis not present

## 2015-03-23 DIAGNOSIS — R51 Headache: Secondary | ICD-10-CM | POA: Diagnosis not present

## 2015-03-23 DIAGNOSIS — E039 Hypothyroidism, unspecified: Secondary | ICD-10-CM

## 2015-03-23 DIAGNOSIS — R519 Headache, unspecified: Secondary | ICD-10-CM

## 2015-03-23 DIAGNOSIS — F329 Major depressive disorder, single episode, unspecified: Secondary | ICD-10-CM | POA: Diagnosis not present

## 2015-03-23 DIAGNOSIS — F32A Depression, unspecified: Secondary | ICD-10-CM

## 2015-03-23 LAB — HEPATIC FUNCTION PANEL
ALT: 19 U/L (ref 7–35)
AST: 26 U/L (ref 13–35)
Alkaline Phosphatase: 83 U/L (ref 25–125)
Bilirubin, Total: 0.3 mg/dL

## 2015-03-23 LAB — CBC AND DIFFERENTIAL
HCT: 34 % — AB (ref 36–46)
Hemoglobin: 11.5 g/dL — AB (ref 12.0–16.0)
Platelets: 266 10*3/uL (ref 150–399)
WBC: 11.5 10*3/mL

## 2015-03-23 LAB — BASIC METABOLIC PANEL
BUN: 14 mg/dL (ref 4–21)
CREATININE: 0.6 mg/dL (ref 0.5–1.1)
GLUCOSE: 101 mg/dL
POTASSIUM: 4 mmol/L (ref 3.4–5.3)
Sodium: 138 mmol/L (ref 137–147)

## 2015-03-23 NOTE — Assessment & Plan Note (Addendum)
03/20/15 MMSE 27/30, continue Aricept

## 2015-03-23 NOTE — Assessment & Plan Note (Signed)
New onset today, 10/10, no change in vision, no nausea, no vomiting, or focal weakness, will schedule Tylenol '650mg'$  tid, Neuro checks, wean O2 down to 2lmp to maintain O2 sat >90%, update CBC, CMP, may ED eval if persists.

## 2015-03-23 NOTE — Assessment & Plan Note (Signed)
Sleeps well, mood is stable, continue Sertraline '25mg'$  daily, better with  tremor, lip smacking, c/o fatigue and dizziness since Sertraline reduced to '25mg'$ .

## 2015-03-23 NOTE — Assessment & Plan Note (Signed)
Continue O2, may wean down to 2lpm to maintain O2 sat >90%, complete Levaquin-last day  03/25/15

## 2015-03-23 NOTE — Progress Notes (Signed)
Patient ID: Rebecca [name], female   DOB: 09/16/1927, 81 y.o.   MRN: [id number]  Location:  SNF [facility] Provider:  Marlana Latus NP  Code Status:  DNR Goals of care: Advanced Directive information    Chief Complaint  Patient presents with  . Medical Management of Chronic Issues  . Acute Visit    headache     HPI: Patient is a 81 y.o. female seen in the SNF at [facility] today for evaluation of new onset of headache, 10/10, right sided head, no vision changes, no focal weakness, denied chest pain or nausea/vomiting,  f/u sepsis due to PNA, Hx of  Hypothyroidism, Memory loss, Depression, Seizure disorder ([place]), Dyslipidemia  Hospitalized from 03/11/2015-03/19/2015 for sepsis due to PNA,  will complete Levaquin every other day for total of 4 doses in SNF. Continue oxygen 5 L [place] on discharge. She presented to ED with T 102.9, Bp 80/64, RR 26, Sat O2 89%, and altered mentation. CXR showed possible bilateral pneumonia. She was started on vanco and zosyn for treatment of HCAP.        Review of Systems:  Review of Systems  Constitutional: Positive for malaise/fatigue. Negative for diaphoresis.  HENT: Positive for hearing loss. Negative for congestion, ear discharge, ear pain, nosebleeds, sore throat and tinnitus.        Bilateral hearing loss. June 2015 ruptured right Tm with drainage, but little pain.  Eyes: Negative.   Respiratory:       History of dyspnea on exertion  Cardiovascular: Negative.   Gastrointestinal:       Blood noted in stool and toilet paper occasionally.  Genitourinary: Positive for frequency.       2-3x/night  Musculoskeletal:       Osteoarthritis with complaints of pain in multiple areas. Neck and right shoulder pains. Sharp pain in the right shoulder and arm when she abducts and rotated at the shoulder  Skin:       Senile ecchymoses  Neurological: Positive for dizziness and tremors. Negative for tingling, sensory change, speech change, focal weakness and  seizures.       Hx seizures. C/o headache 10/10 right sided   Endo/Heme/Allergies: Negative for environmental allergies and polydipsia.  Psychiatric/Behavioral: Positive for suicidal ideas. The patient has insomnia.        Chronic anxiety. Memory deficits.    Past Medical History  Diagnosis Date  . Osteoporosis 02/2011  . Seizures ([place])   . DJD (degenerative joint disease)   . Diverticulosis 02/2011  . Hemorrhoids 02/2011  . Dizziness   . Low back pain   . Mitral valve problem thickened    thickened  . Unspecified hypothyroidism 01/2011  . 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 Vitamin D deficiency 01/2011  . Anemia, unspecified 03/18/2011  . Anxiety state, unspecified 01/2011  . 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 Pain in joint, site unspecified 01/2012  . Sacroiliitis, not elsewhere classified ([place]) 05/2011  . Dysuria 06/17/2011  . Other abnormal blood chemistry 0/30/2012  . Blood in stool 06/15/2012  . Other acquired deformity of toe 12/16/2011  . Corns and callosities 07/01/2011    Patient Active Problem List   Diagnosis Date Noted  . Headache 03/23/2015  . Acute respiratory failure with hypoxia ([place]) 03/12/2015  . Dyslipidemia 03/12/2015  . Acute encephalopathy 03/12/2015  . Healthcare-associated pneumonia 03/11/2015  . Leukocytosis 03/11/2015  . Seizure disorder ([place]) 03/11/2015  . Depression 09/26/2014  . Memory  loss 09/19/2012  . Hypothyroidism 12/08/2006    Allergies  Allergen Reactions  . Morphine And Related Other (See Comments)    Altered mental state   . Sulfa Antibiotics Other (See Comments)    Reaction: unknown    Medications: Patient's Medications  New Prescriptions   No medications on file  Previous Medications   ACETAMINOPHEN (TYLENOL) 650 MG CR TABLET    Take 650 mg by mouth daily as needed for pain.   CALCIUM CARBONATE-VITAMIN D (CALCIUM 600+D) 600-400 MG-UNIT TABLET    Take 1 tablet by  mouth daily.   CETIRIZINE (KLS ALLER-TEC) 10 MG TABLET    Take 10 mg by mouth daily as needed for allergies.    CHOLECALCIFEROL (VITAMIN D-3) 5000 UNITS TABS    Take 5,000 Units by mouth daily.    CLONAZEPAM (KLONOPIN) 0.5 MG TABLET    Take 1 tablet (0.5 mg total) by mouth 2 (two) times daily as needed (anxiety).   DONEPEZIL (ARICEPT) 10 MG TABLET    Take 10 mg by mouth daily.   GUAIFENESIN-DEXTROMETHORPHAN (ROBITUSSIN DM) 100-10 MG/5ML SYRUP    Take 5 mLs by mouth every 4 (four) hours as needed for cough.   LEVOFLOXACIN (LEVAQUIN) 750 MG TABLET    Take 1 tablet (750 mg total) by mouth every other day.   LEVOTHYROXINE (SYNTHROID, LEVOTHROID) 100 MCG TABLET    Take 1 tablet (100 mcg total) by mouth daily before breakfast. For thyroid   MELOXICAM (MOBIC) 15 MG TABLET    TAKE 1 TABLET BY MOUTH ONCE DAILY TO HELP ARTHRITIS   MULTIPLE VITAMIN (MULTIVITAMINS PO)    Take 1 tablet by mouth daily.    OMEGA-3 ACID ETHYL ESTERS (LOVAZA) 1 G CAPSULE    Take 1 g by mouth daily. Fatty Acid one daily   ONDANSETRON (ZOFRAN) 4 MG TABLET    Take 1 tablet (4 mg total) by mouth every 6 (six) hours as needed for nausea.   PHENYTOIN (DILANTIN) 100 MG ER CAPSULE    Take 1 capsule by mouth in  the morning and 2 capsules  by mouth at bedtime to prevent seizure.   SACCHAROMYCES BOULARDII (FLORASTOR) 250 MG CAPSULE    Take 250 mg by mouth 2 (two) times daily.   SERTRALINE (ZOLOFT) 50 MG TABLET    One daily to improve anxiety and depression   TRAMADOL (ULTRAM) 50 MG TABLET    Take 1 tablet (50 mg total) by mouth every 6 (six) hours as needed for moderate pain or severe pain.  Modified Medications   No medications on file  Discontinued Medications   No medications on file    Physical Exam: Filed Vitals:   03/23/15 1222  BP: 104/72  Pulse: 92  Temp: 99.4 F (37.4 C)  TempSrc: Tympanic  Resp: 18   There is no weight on file to calculate BMI.  Physical Exam  Constitutional: She is oriented to person, place, and  time. She appears well-developed and well-nourished. No distress.  HENT:  Bilateral hearing loss. Rupture of the right TM.   Eyes: Conjunctivae and EOM are normal. Pupils are equal, round, and reactive to light.  Lens implants in both eyes  Neck: Normal range of motion. Neck supple. No JVD present. No tracheal deviation present. No thyromegaly present.  Cardiovascular: Normal rate, regular rhythm, normal heart sounds and intact distal pulses.  Exam reveals no gallop and no friction rub.   No murmur heard. Pulmonary/Chest: Effort normal and breath sounds normal. No respiratory distress. She  has no wheezes. She exhibits no tenderness.  Abdominal: Soft. Bowel sounds are normal. She exhibits no distension and no mass. There is no tenderness.  Genitourinary: Guaiac negative stool.  From previous exam: Hemorrhoids. Normal sphincter tone. Stools are normal brown color.  Musculoskeletal: Normal range of motion. She exhibits no edema or tenderness.  Tender in the posterior neck. Mild restriction in movements. Hammertoes at the left second and third. Right second toe overlaps the great toe. SP surgical repair of the right 2nd toe.  Shoulders sore. Painful in the right shoulder when she lifts overhead and rotates at the shpoulder.[facility] July 2014. Unstable gait. Using walker with 2 front wheels and rear skids. Weaker grip of the right hand.. Tender in the left SI joint.  Lymphadenopathy:    She has no cervical adenopathy.  Neurological: She is alert and oriented to person, place, and time. No cranial nerve deficit. Coordination normal.  No loss of grip strength. Loss of memory. 04/04/14 MMSE 25/30. Passed clock drawing. 07/04/14 MMSE 28/30. Failed clock drawing. Bilateral tremor.  Skin: Skin is warm and dry. No rash noted. No erythema. No pallor.  Psychiatric: She has a normal mood and affect. Her behavior is normal. Thought content normal.    Labs reviewed: Basic Metabolic Panel:  Recent Labs   03/15/15 0920 03/17/15 0541 03/18/15 0550 03/23/15  NA 137 139 139 138  K 3.4* 3.8 3.9 4.0  CL 104 106 104  --   CO2 '26 27 28  '$ --   GLUCOSE 154* 112* 125*  --   BUN '11 13 11 14  '$ CREATININE 0.58 0.57 0.64 0.6  CALCIUM 8.2* 8.3* 8.6*  --     Liver Function Tests:  Recent Labs  03/08/15 03/11/15 1838 03/23/15  AST '20 22 26  '$ ALT '13 15 19  '$ ALKPHOS 74 62 83  BILITOT  --  0.6  --   PROT  --  6.4*  --   ALBUMIN  --  3.3*  --     CBC:  Recent Labs  01/11/15 1634  03/11/15 1838 03/12/15 0334 03/15/15 0920 03/23/15  WBC 5.5  < > 13.0* 11.0* 9.1 11.5  NEUTROABS 3.6  --  11.8*  --   --   --   HGB 12.8  < > 12.5 12.6 10.8* 11.5*  HCT 38.7  < > 38.3 39.0 32.4* 34*  MCV 99.5  < > 98.2 100.5* 97.9  --   PLT 195  < > 157 155 184 266  < > = values in this interval not displayed.  Lab Results  Component Value Date   TSH 1.47 03/08/2015   Lab Results  Component Value Date   HGBA1C 5.4 03/08/2015   Lab Results  Component Value Date   CHOL 235* 03/06/2009   HDL 108.40 03/06/2009   LDLDIRECT 108.2 03/06/2009    Significant Diagnostic Results since last visit: none  Patient Care Team: Estill Dooms, MD as PCP - General (Internal Medicine) [facility] [name], MD as Consulting Physician (Orthopedic Surgery) [name], MD as Consulting Physician (Physical Medicine and Rehabilitation) Melina Schools, MD as Consulting Physician (Orthopedic Surgery) [name], MD as Consulting Physician (Otolaryngology)  Assessment/Plan Problem List Items Addressed This Visit    Hypothyroidism (Chronic)    Continue Levothyroxine 14mg 01/18/15 TSH 1.723      Memory loss (Chronic)    03/20/15 MMSE 27/30, continue Aricept      Depression    Sleeps well, mood is stable, continue Sertraline  $'25mg'M$  daily, better with  tremor, lip smacking, c/o fatigue and dizziness since Sertraline reduced to '25mg'$ .       Healthcare-associated pneumonia    Continue O2, may wean down to 2lpm  to maintain O2 sat >90%, complete Levaquin-last day  03/25/15      Seizure disorder ([place])    Last seizure 2010 02/01/15 Dilantin lever 12.4(10-20) Continue Dilantin '100mg'$  daily      Headache - Primary    New onset today, 10/10, no change in vision, no nausea, no vomiting, or focal weakness, will schedule Tylenol '650mg'$  tid, Neuro checks, wean O2 down to 2lmp to maintain O2 sat >90%, update CBC, CMP, may ED eval if persists.           Family/ staff Communication: goal is to return to [place] when able, here for rehab. May ED eval if no better after Tylenol. Neuro checks. Wean O2 down to 2lpm to maintain O2 sat >90%  Labs/tests ordered: CBC, CMP  ManXie Ellanora Rayborn NP [facility] [address], [place] [address] On Call:  [phone] & follow prompts after 5pm & weekends Office Phone:  [phone] Office Fax:  [phone]

## 2015-03-23 NOTE — Assessment & Plan Note (Signed)
Continue Levothyroxine 172mg 01/18/15 TSH 1.723

## 2015-03-23 NOTE — Assessment & Plan Note (Signed)
Last seizure 2010 02/01/15 Dilantin lever 12.4(10-20) Continue Dilantin '100mg'$  daily

## 2015-03-26 ENCOUNTER — Encounter: Payer: Self-pay | Admitting: Internal Medicine

## 2015-03-26 NOTE — Progress Notes (Signed)
Patient ID: Rebecca [name], female   DOB: August 28, 1927, 81 y.o.   MRN: [id number]    HISTORY AND PHYSICAL  Location:  [facility] Room Number: N 58 Place of Service: SNF (31)   Extended Emergency Contact Information Primary Emergency Contact: Loughmiller,[name] Address: [address] [place], [place] Phone: [phone] Mobile Phone: [phone] Relation: Son Secondary Emergency Contact: [name] [place] Phone: [phone] Mobile Phone: [phone] Relation: Daughter  Advanced Directive information Does patient have an advance directive?: Yes, Type of Advance Directive: Out of facility DNR (pink MOST or yellow form);Living will;Healthcare Power of Attorney, Pre-existing out of facility DNR order (yellow form or pink MOST form): Yellow form placed in chart (order not valid for inpatient use), Does patient want to make changes to advanced directive?: No - Patient declined  Chief Complaint  Patient presents with  . New Admit To SNF    Following hospitalization    HPI Patient was admitted to skilled nursing facility 03/19/2015 following hospitalization from 03/11/2015 through 03/19/2015 for sepsis with pneumonia. She was initially treated with vancomycin and Zosyn intravenously, and then switched to Levaquin orally.  Patient had an altered mental status on admission but seemed to do better after antibiotics were started.  There is a history of chronic depression.  Patient was oxygen dependent during the hospital stay and was discharged to facility 1 up to 5 L of oxygen per minute. This is new for her. Patient complains of a continued cough today occasionally producing thick mucus. She is afebrile.  She is now admitted to the skilled nursing facility for continued efforts at strengthening, improved gait mobility and safety, and improvement in self-care skills.  Past Medical History  Diagnosis Date  .  Osteoporosis 02/2011  . Seizures ([place])   . DJD (degenerative joint disease)   . Diverticulosis 02/2011  . Hemorrhoids 02/2011  . Dizziness   . Low back pain   . Mitral valve problem thickened    thickened  . Unspecified hypothyroidism 01/2011  . 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 Vitamin D deficiency 01/2011  . Anemia, unspecified 03/18/2011  . Anxiety state, unspecified 01/2011  . 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 Pain in joint, site unspecified 01/2012  . Sacroiliitis, not elsewhere classified ([place]) 05/2011  . Dysuria 06/17/2011  . Other abnormal blood chemistry 0/30/2012  . Blood in stool 06/15/2012  . Other acquired deformity of toe 12/16/2011  . Corns and callosities 07/01/2011    Past Surgical History  Procedure Laterality Date  . Cervical laminectomy  2005  . Bletheroplasty    . Hammer toe surgery  2013    right 2nd toe Dr. Mallie Mussel  . Joint replacement Bilateral 2002 Right, 1992 Left    knees  . Cataract extraction w/ intraocular lens  implant, bilateral  2010    Patient Care Team: Estill Dooms, MD as PCP - General (Internal Medicine) [facility] [name], MD as Consulting Physician (Orthopedic Surgery) [name], MD as Consulting Physician (Physical Medicine and Rehabilitation) Melina Schools, MD as Consulting Physician (Orthopedic Surgery) [name], MD as Consulting Physician (Otolaryngology)  Social History   Social History  . Marital Status: Widowed    Spouse Name: N/A  . Number of Children: N/A  . Years of Education: N/A   Occupational History  . Not on file.  Social History Main Topics  . Smoking status: Former Smoker    Quit date: 09/14/1988  . Smokeless tobacco: Never Used  . Alcohol Use: 0.6 oz/week    1 Glasses of wine per week     Comment: twice a month  . Drug Use: No  . Sexual Activity: No   Other Topics Concern  . Not on file   Social History Narrative    Lives at [facility] since 10/07/2010, moved to [place] 01/16/2015   Widowed   Exercise class 3 times a week     Never smoked   Alcholol wine social    POA, Living Will    reports that she quit smoking about 26 years ago. She has never used smokeless tobacco. She reports that she drinks about 0.6 oz of alcohol per week. She reports that she does not use illicit drugs.  Family History  Problem Relation Age of Onset  . Alzheimer's disease Sister   . Emphysema Sister    Family Status  Relation Status Death Age  . Sister Deceased   . Sister Deceased 34  . Mother Deceased 66    CVA  . Father Deceased 6  . Sister Alive   . Daughter Alive   . Son Alive     Immunization History  Administered Date(s) Administered  . Influenza Whole 03/17/2007, 02/16/2009, 01/30/2010, 02/16/2013  . Influenza-Unspecified 03/02/2014  . PPD Test 07/09/2010  . Pneumococcal Polysaccharide-23 05/19/2005, 05/23/2013  . Td 05/19/2005  . Tetanus 10/06/2012    Allergies  Allergen Reactions  . Morphine And Related Other (See Comments)    Altered mental state   . Sulfa Antibiotics Other (See Comments)    Reaction: unknown    Medications: Patient's Medications  New Prescriptions   No medications on file  Previous Medications   ACETAMINOPHEN (TYLENOL) 650 MG CR TABLET    Take 650 mg by mouth daily as needed for pain.   CALCIUM CARBONATE-VITAMIN D (CALCIUM 600+D) 600-400 MG-UNIT TABLET    Take 1 tablet by mouth daily.   CETIRIZINE (KLS ALLER-TEC) 10 MG TABLET    Take 10 mg by mouth daily as needed for allergies.    CHOLECALCIFEROL (VITAMIN D-3) 5000 UNITS TABS    Take 5,000 Units by mouth daily.    CLONAZEPAM (KLONOPIN) 0.5 MG TABLET    Take 1 tablet (0.5 mg total) by mouth 2 (two) times daily as needed (anxiety).   DONEPEZIL (ARICEPT) 10 MG TABLET    Take 10 mg by mouth daily.   GUAIFENESIN-DEXTROMETHORPHAN (ROBITUSSIN DM) 100-10 MG/5ML SYRUP    Take 5 mLs by mouth every 4 (four) hours as needed for  cough.   LEVOFLOXACIN (LEVAQUIN) 750 MG TABLET    Take 1 tablet (750 mg total) by mouth every other day.   LEVOTHYROXINE (SYNTHROID, LEVOTHROID) 100 MCG TABLET    Take 1 tablet (100 mcg total) by mouth daily before breakfast. For thyroid   MELOXICAM (MOBIC) 15 MG TABLET    TAKE 1 TABLET BY MOUTH ONCE DAILY TO HELP ARTHRITIS   MULTIPLE VITAMIN (MULTIVITAMINS PO)    Take 1 tablet by mouth daily.    OMEGA-3 ACID ETHYL ESTERS (LOVAZA) 1 G CAPSULE    Take 1 g by mouth daily. Fatty Acid one daily   ONDANSETRON (ZOFRAN) 4 MG TABLET    Take 1 tablet (4 mg total) by mouth every 6 (six) hours as needed for nausea.   PHENYTOIN (DILANTIN) 100 MG ER CAPSULE    Take 1  capsule by mouth in  the morning and 2 capsules  by mouth at bedtime to prevent seizure.   SACCHAROMYCES BOULARDII (FLORASTOR) 250 MG CAPSULE    Take 250 mg by mouth 2 (two) times daily.   SERTRALINE (ZOLOFT) 50 MG TABLET    One daily to improve anxiety and depression   TRAMADOL (ULTRAM) 50 MG TABLET    Take 1 tablet (50 mg total) by mouth every 6 (six) hours as needed for moderate pain or severe pain.  Modified Medications   No medications on file  Discontinued Medications   No medications on file    Review of Systems  Constitutional: Negative for diaphoresis.  HENT: Positive for hearing loss. Negative for congestion, ear discharge, ear pain, nosebleeds, sore throat and tinnitus.        Bilateral hearing loss. June 2015 ruptured right Tm with drainage, but little pain.  Eyes: Negative.   Respiratory: Positive for cough (Producing some thick mucus).        History of dyspnea on exertion  Cardiovascular: Negative.   Gastrointestinal:       Blood noted in stool and toilet paper occasionally.  Endocrine: Negative for polydipsia.  Genitourinary: Positive for frequency.       2-3x/night  Musculoskeletal:       Osteoarthritis with complaints of pain in multiple areas. Neck and right shoulder pains. Sharp pain in the right shoulder and arm  when she abducts and rotated at the shoulder  Skin:       Senile ecchymoses  Allergic/Immunologic: Negative for environmental allergies.  Neurological: Positive for dizziness and tremors.       Hx seizures.   Psychiatric/Behavioral: Positive for suicidal ideas.       Chronic anxiety. Memory deficits.    Filed Vitals:   03/26/15 1541  BP: 101/53  Pulse: 85  Temp: 97.7 F (36.5 C)  Resp: 20  Height: 5' (1.524 m)  Weight: 126 lb (57.153 kg)  SpO2: 90%   Body mass index is 24.61 kg/(m^2).  Physical Exam  Constitutional: She is oriented to person, place, and time. She appears well-developed and well-nourished. No distress.  HENT:  Bilateral hearing loss. Rupture of the right TM.   Eyes: Conjunctivae and EOM are normal. Pupils are equal, round, and reactive to light.  Lens implants in both eyes  Neck: Normal range of motion. Neck supple. No JVD present. No tracheal deviation present. No thyromegaly present.  Cardiovascular: Normal rate, regular rhythm, normal heart sounds and intact distal pulses.  Exam reveals no gallop and no friction rub.   No murmur heard. Pulmonary/Chest: Effort normal and breath sounds normal. No respiratory distress. She has no wheezes. She exhibits no tenderness.  Coughing. Oxygen dependent.  Abdominal: Soft. Bowel sounds are normal. She exhibits no distension and no mass. There is no tenderness.  Genitourinary: Guaiac negative stool.  From previous exam: Hemorrhoids. Normal sphincter tone. Stools are normal brown color.  Musculoskeletal: Normal range of motion. She exhibits no edema or tenderness.  Tender in the posterior neck. Mild restriction in movements. Hammertoes at the left second and third. Right second toe overlaps the great toe. SP surgical repair of the right 2nd toe.  Shoulders sore. Painful in the right shoulder when she lifts overhead and rotates at the [place].[facility] July 2014. Unstable gait. Using walker with 2 front wheels and rear  skids. Weaker grip of the right hand.. Tender in the left SI joint.  Lymphadenopathy:    She has no cervical adenopathy.  Neurological: She is alert and oriented to person, place, and time. No cranial nerve deficit. Coordination normal.  No loss of grip strength. Loss of memory. 04/04/14 MMSE 25/30. Passed clock drawing. 07/04/14 MMSE 28/30. Failed clock drawing. 03/20/2015 MMSE 27/30. Passed clock drawing. Bilateral tremor.  Skin: Skin is warm and dry. No rash noted. No erythema. No pallor.  Psychiatric: She has a normal mood and affect. Her behavior is normal. Thought content normal.    Labs reviewed: Lab Summary Latest Ref Rng 03/18/2015 03/17/2015 03/15/2015 03/12/2015 03/11/2015  Hemoglobin 12.0 - 15.0 g/dL (None) (None) 10.8(L) 12.6 12.5  Hematocrit 36.0 - 46.0 % (None) (None) 32.4(L) 39.0 38.3  White count 4.0 - 10.5 K/uL (None) (None) 9.1 11.0(H) 13.0(H)  Platelet count 150 - 400 K/uL (None) (None) 184 155 157  Sodium 135 - 145 mmol/L 139 139 137 142 135  Potassium 3.5 - 5.1 mmol/L 3.9 3.8 3.4(L) 3.7 4.3  Calcium 8.9 - 10.3 mg/dL 8.6(L) 8.3(L) 8.2(L) 8.4(L) 8.6(L)  Phosphorus - (None) (None) (None) (None) (None)  Creatinine 0.44 - 1.00 mg/dL 0.64 0.57 0.58 0.70 0.74  AST 15 - 41 U/L (None) (None) (None) (None) 22  Alk Phos 38 - 126 U/L (None) (None) (None) (None) 62  Bilirubin 0.3 - 1.2 mg/dL (None) (None) (None) (None) 0.6  Glucose 65 - 99 mg/dL 125(H) 112(H) 154(H) 123(H) 131(H)  Cholesterol - (None) (None) (None) (None) (None)  HDL cholesterol - (None) (None) (None) (None) (None)  Triglycerides - (None) (None) (None) (None) (None)  LDL Direct - (None) (None) (None) (None) (None)  LDL Calc - (None) (None) (None) (None) (None)  Total protein 6.5 - 8.1 g/dL (None) (None) (None) (None) 6.4(L)  Albumin 3.5 - 5.0 g/dL (None) (None) (None) (None) 3.3(L)   Lab Results  Component Value Date   BUN 11 03/18/2015   Lab Results  Component Value Date   HGBA1C 5.4 03/08/2015    Lab Results  Component Value Date   TSH 1.47 03/08/2015          Dg Chest 2 View  03/11/2015  CLINICAL DATA:  Sepsis. Fever. Altered mental status. Urinary tract infection. EXAM: CHEST  2 VIEW COMPARISON:  Chest x-rays dated 07/25/2013 and 01/28/2011 and 01/22/2011 FINDINGS: Heart size and pulmonary vascularity are normal. The patient has developed patchy bilateral peripheral densities which are similar to those present in September 2012. No effusions. No acute osseous abnormality. IMPRESSION: Patchy bilateral pulmonary infiltrates. Findings are consistent with pneumonia/pneumonitis. Electronically Signed   By: Lorriane Shire M.D.   On: 03/11/2015 18:54   Ct Chest Wo Contrast  03/16/2015  CLINICAL DATA:  81 year old female with shortness of Breath, fever, altered mental status. Worsening lung opacity on chest radiographs. Initial encounter. EXAM: CT CHEST WITHOUT CONTRAST TECHNIQUE: Multidetector CT imaging of the chest was performed following the standard protocol without IV contrast. COMPARISON:  Portable chest 0939 hours today, and earlier. FINDINGS: Cardiomegaly. Small to moderate layering bilateral pleural effusions. No pericardial effusion. Calcified atherosclerosis of the aorta and coronary arteries. Tortuous thoracic aorta and proximal great vessels. Major airways are patent aside from atelectatic changes. Widespread and confluent pulmonary ground-glass opacity in the lungs right greater than left, with associated interstitial thickening. Areas of sparing also demonstrate attenuated vasculature suggestive of air trapping. This may reflect underlying emphysema. No areas of consolidation. Several calcified probable granulomas in the right upper and mid lung. Furthermore, there is fine reticulonodular calcification at the lung bases which is evident on multiple prior chest x-rays, suggesting alveolar  microlithiasis. Mild AP window and right peritracheal lymph node enlargement (up to 13 mm)  appears reactive in nature. No axillary lymphadenopathy. Visualized noncontrast liver, spleen, pancreas, and bowel in the upper abdomen are within normal limits. There is thickening of the left adrenal gland probably due to adrenal hyperplasia. Grossly negative visualized left kidney. Diffuse spinal disc and endplate degeneration, severe in the lower thoracic levels with extensive vacuum phenomena. No acute osseous abnormality identified. IMPRESSION: 1. Scattered right greater than left pulmonary ground-glass opacity and some septal thickening superimposed on suspected chronic lung disease with emphysema and/or a degree of pulmonary fibrosis. Favor atypical infection, especially viral. 2. Small to moderate layering bilateral pleural effusions. Reactive appearing mediastinal lymphadenopathy. 3. Cardiomegaly with no pericardial effusion. Electronically Signed   By: Genevie Ann M.D.   On: 03/16/2015 15:02   Dg Chest Port 1 View  03/16/2015  CLINICAL DATA:  Sob; patient also having right elbow pain, especially when abducting right arm; no injury EXAM: PORTABLE CHEST - 1 VIEW COMPARISON:  03/11/2015 FINDINGS: Coarse interstitial opacities throughout both lungs right greater than left, increased since previous exam. Some increase in bibasilar airspace opacities right greater than left, and new right suprahilar airspace disease. No pneumothorax. Blunting of lateral costophrenic angles suggesting small effusions. Spondylitic changes in the thoracolumbar spine a lumbar dextroscoliosis. IMPRESSION: 1. Worsening asymmetric interstitial and airspace edema or infiltrates, right worse than left. Electronically Signed   By: Lucrezia Europe M.D.   On: 03/16/2015 10:23   Dg Humerus Right  03/16/2015  CLINICAL DATA:  Sob; patient also having right elbow pain, especially when abducting right arm; no injury EXAM: RIGHT HUMERUS - 2+ VIEW COMPARISON:  None. FINDINGS: There is no evidence of fracture or other focal bone lesions. Soft  tissues are unremarkable. Small acromioclavicular spurs. IMPRESSION: Negative humerus. Electronically Signed   By: Lucrezia Europe M.D.   On: 03/16/2015 10:24     Assessment/Plan  1. Healthcare-associated pneumonia Patient continues to produce sputum. She is continuing with her cough. She remains oxygen dependent. She is short of breath at rest and on exertion. Oxygen continues to drop at times of exertion. She is finishing her Levaquin. -Antabuse and asked 600 mg twice daily to try to thin mucus -encouraged increase in fluid intake.  2. Memory loss Chronic condition. Mild. Patient is conversant. There is impairment in executive functioning. She still wants to drive and do things for which I believe she is not capable.  3. Acute encephalopathy Appears to be resolved.  4. Acute respiratory failure with hypoxia (HCC) Continue oxygen. Hopefully she can be weaned from this is pulmonary function improves and healing takes place.  5. Depression Remains on sertraline 50 mg daily.  6. Seizure disorder -Appears to be controlled on current dosing of phenytoin 100 mg in the morning and 200 mg at bedtime.

## 2015-03-28 ENCOUNTER — Emergency Department (HOSPITAL_COMMUNITY): Payer: Medicare Other

## 2015-03-28 ENCOUNTER — Encounter (HOSPITAL_COMMUNITY): Payer: Self-pay

## 2015-03-28 ENCOUNTER — Emergency Department (HOSPITAL_COMMUNITY)
Admit: 2015-03-28 | Discharge: 2015-03-28 | Disposition: A | Discharge status: Home / Self Care | Payer: Medicare Other | Attending: Emergency Medicine | Admitting: Emergency Medicine

## 2015-03-28 ENCOUNTER — Inpatient Hospital Stay (HOSPITAL_COMMUNITY)
Admission: EM | Admit: 2015-03-28 | Discharge: 2015-04-04 | DRG: 166 | Disposition: A | Discharge status: Skilled Nursing Facility | Payer: Medicare Other | Attending: Internal Medicine | Admitting: Internal Medicine

## 2015-03-28 DIAGNOSIS — J9600 Acute respiratory failure, unspecified whether with hypoxia or hypercapnia: Secondary | ICD-10-CM

## 2015-03-28 DIAGNOSIS — I509 Heart failure, unspecified: Secondary | ICD-10-CM

## 2015-03-28 DIAGNOSIS — M1990 Unspecified osteoarthritis, unspecified site: Secondary | ICD-10-CM | POA: Diagnosis present

## 2015-03-28 DIAGNOSIS — J189 Pneumonia, unspecified organism: Secondary | ICD-10-CM | POA: Diagnosis present

## 2015-03-28 DIAGNOSIS — R0602 Shortness of breath: Secondary | ICD-10-CM

## 2015-03-28 DIAGNOSIS — F0390 Unspecified dementia without behavioral disturbance: Secondary | ICD-10-CM | POA: Diagnosis present

## 2015-03-28 DIAGNOSIS — R413 Other amnesia: Secondary | ICD-10-CM

## 2015-03-28 DIAGNOSIS — J9601 Acute respiratory failure with hypoxia: Secondary | ICD-10-CM | POA: Diagnosis not present

## 2015-03-28 DIAGNOSIS — Z9981 Dependence on supplemental oxygen: Secondary | ICD-10-CM | POA: Diagnosis not present

## 2015-03-28 DIAGNOSIS — F0391 Unspecified dementia with behavioral disturbance: Secondary | ICD-10-CM | POA: Diagnosis present

## 2015-03-28 DIAGNOSIS — E785 Hyperlipidemia, unspecified: Secondary | ICD-10-CM

## 2015-03-28 DIAGNOSIS — Z87891 Personal history of nicotine dependence: Secondary | ICD-10-CM

## 2015-03-28 DIAGNOSIS — M810 Age-related osteoporosis without current pathological fracture: Secondary | ICD-10-CM | POA: Diagnosis present

## 2015-03-28 DIAGNOSIS — F411 Generalized anxiety disorder: Secondary | ICD-10-CM | POA: Diagnosis present

## 2015-03-28 DIAGNOSIS — I2609 Other pulmonary embolism with acute cor pulmonale: Principal | ICD-10-CM

## 2015-03-28 DIAGNOSIS — Z66 Do not resuscitate: Secondary | ICD-10-CM | POA: Diagnosis not present

## 2015-03-28 DIAGNOSIS — R41841 Cognitive communication deficit: Secondary | ICD-10-CM | POA: Diagnosis not present

## 2015-03-28 DIAGNOSIS — Z79899 Other long term (current) drug therapy: Secondary | ICD-10-CM

## 2015-03-28 DIAGNOSIS — R7989 Other specified abnormal findings of blood chemistry: Secondary | ICD-10-CM | POA: Diagnosis not present

## 2015-03-28 DIAGNOSIS — R9431 Abnormal electrocardiogram [ECG] [EKG]: Secondary | ICD-10-CM | POA: Diagnosis present

## 2015-03-28 DIAGNOSIS — F0280 Dementia in other diseases classified elsewhere without behavioral disturbance: Secondary | ICD-10-CM | POA: Diagnosis not present

## 2015-03-28 DIAGNOSIS — R262 Difficulty in walking, not elsewhere classified: Secondary | ICD-10-CM | POA: Diagnosis not present

## 2015-03-28 DIAGNOSIS — R51 Headache: Secondary | ICD-10-CM | POA: Diagnosis not present

## 2015-03-28 DIAGNOSIS — E038 Other specified hypothyroidism: Secondary | ICD-10-CM

## 2015-03-28 DIAGNOSIS — R778 Other specified abnormalities of plasma proteins: Secondary | ICD-10-CM | POA: Diagnosis present

## 2015-03-28 DIAGNOSIS — Z885 Allergy status to narcotic agent status: Secondary | ICD-10-CM | POA: Diagnosis not present

## 2015-03-28 DIAGNOSIS — I824Z3 Acute embolism and thrombosis of unspecified deep veins of distal lower extremity, bilateral: Secondary | ICD-10-CM | POA: Diagnosis present

## 2015-03-28 DIAGNOSIS — G40909 Epilepsy, unspecified, not intractable, without status epilepticus: Secondary | ICD-10-CM | POA: Diagnosis present

## 2015-03-28 DIAGNOSIS — G3183 Dementia with Lewy bodies: Secondary | ICD-10-CM | POA: Diagnosis not present

## 2015-03-28 DIAGNOSIS — Z961 Presence of intraocular lens: Secondary | ICD-10-CM | POA: Diagnosis present

## 2015-03-28 DIAGNOSIS — E43 Unspecified severe protein-calorie malnutrition: Secondary | ICD-10-CM | POA: Diagnosis not present

## 2015-03-28 DIAGNOSIS — I82412 Acute embolism and thrombosis of left femoral vein: Secondary | ICD-10-CM | POA: Diagnosis not present

## 2015-03-28 DIAGNOSIS — Z9842 Cataract extraction status, left eye: Secondary | ICD-10-CM

## 2015-03-28 DIAGNOSIS — D649 Anemia, unspecified: Secondary | ICD-10-CM | POA: Diagnosis present

## 2015-03-28 DIAGNOSIS — J9611 Chronic respiratory failure with hypoxia: Secondary | ICD-10-CM

## 2015-03-28 DIAGNOSIS — I2782 Chronic pulmonary embolism: Secondary | ICD-10-CM | POA: Diagnosis present

## 2015-03-28 DIAGNOSIS — Z882 Allergy status to sulfonamides status: Secondary | ICD-10-CM | POA: Diagnosis not present

## 2015-03-28 DIAGNOSIS — F32A Depression, unspecified: Secondary | ICD-10-CM | POA: Diagnosis present

## 2015-03-28 DIAGNOSIS — R2681 Unsteadiness on feet: Secondary | ICD-10-CM | POA: Diagnosis not present

## 2015-03-28 DIAGNOSIS — D72829 Elevated white blood cell count, unspecified: Secondary | ICD-10-CM

## 2015-03-28 DIAGNOSIS — F329 Major depressive disorder, single episode, unspecified: Secondary | ICD-10-CM | POA: Diagnosis present

## 2015-03-28 DIAGNOSIS — R069 Unspecified abnormalities of breathing: Secondary | ICD-10-CM | POA: Diagnosis not present

## 2015-03-28 DIAGNOSIS — E278 Other specified disorders of adrenal gland: Secondary | ICD-10-CM | POA: Diagnosis present

## 2015-03-28 DIAGNOSIS — F03918 Unspecified dementia, unspecified severity, with other behavioral disturbance: Secondary | ICD-10-CM | POA: Diagnosis present

## 2015-03-28 DIAGNOSIS — E559 Vitamin D deficiency, unspecified: Secondary | ICD-10-CM | POA: Diagnosis present

## 2015-03-28 DIAGNOSIS — J8489 Other specified interstitial pulmonary diseases: Secondary | ICD-10-CM | POA: Diagnosis not present

## 2015-03-28 DIAGNOSIS — I82419 Acute embolism and thrombosis of unspecified femoral vein: Secondary | ICD-10-CM

## 2015-03-28 DIAGNOSIS — I82413 Acute embolism and thrombosis of femoral vein, bilateral: Secondary | ICD-10-CM | POA: Diagnosis not present

## 2015-03-28 DIAGNOSIS — G9340 Encephalopathy, unspecified: Secondary | ICD-10-CM

## 2015-03-28 DIAGNOSIS — Z9841 Cataract extraction status, right eye: Secondary | ICD-10-CM

## 2015-03-28 DIAGNOSIS — E039 Hypothyroidism, unspecified: Secondary | ICD-10-CM | POA: Diagnosis present

## 2015-03-28 DIAGNOSIS — M6281 Muscle weakness (generalized): Secondary | ICD-10-CM | POA: Diagnosis not present

## 2015-03-28 DIAGNOSIS — I2699 Other pulmonary embolism without acute cor pulmonale: Secondary | ICD-10-CM | POA: Diagnosis not present

## 2015-03-28 LAB — CBC WITH DIFFERENTIAL/PLATELET
BASOS ABS: 0.1 10*3/uL (ref 0.0–0.1)
BASOS PCT: 1 %
EOS ABS: 1.4 10*3/uL — AB (ref 0.0–0.7)
Eosinophils Relative: 11 %
HCT: 38.8 % (ref 36.0–46.0)
Hemoglobin: 12.7 g/dL (ref 12.0–15.0)
LYMPHS ABS: 1.4 10*3/uL (ref 0.7–4.0)
Lymphocytes Relative: 12 %
MCH: 32.3 pg (ref 26.0–34.0)
MCHC: 32.7 g/dL (ref 30.0–36.0)
MCV: 98.7 fL (ref 78.0–100.0)
Monocytes Absolute: 1.2 10*3/uL — ABNORMAL HIGH (ref 0.1–1.0)
Monocytes Relative: 10 %
NEUTROS PCT: 66 %
Neutro Abs: 8.3 10*3/uL — ABNORMAL HIGH (ref 1.7–7.7)
PLATELETS: 255 10*3/uL (ref 150–400)
RBC: 3.93 MIL/uL (ref 3.87–5.11)
RDW: 14.1 % (ref 11.5–15.5)
WBC: 12.3 10*3/uL — AB (ref 4.0–10.5)

## 2015-03-28 LAB — BASIC METABOLIC PANEL
Anion gap: 8 (ref 5–15)
BUN: 19 mg/dL (ref 6–20)
CHLORIDE: 103 mmol/L (ref 101–111)
CO2: 27 mmol/L (ref 22–32)
CREATININE: 0.77 mg/dL (ref 0.44–1.00)
Calcium: 9 mg/dL (ref 8.9–10.3)
GFR calc non Af Amer: >60 mL/min (ref >60)
Glucose, Bld: 141 mg/dL — ABNORMAL HIGH (ref 65–99)
POTASSIUM: 4.6 mmol/L (ref 3.5–5.1)
Sodium: 138 mmol/L (ref 135–145)

## 2015-03-28 LAB — BRAIN NATRIURETIC PEPTIDE: B NATRIURETIC PEPTIDE 5: 495.8 pg/mL — AB (ref 0.0–100.0)

## 2015-03-28 LAB — PROTIME-INR
INR: 1.13 (ref 0.00–1.49)
INR: 1.2 (ref 0.00–1.49)
PROTHROMBIN TIME: 15.4 s — AB (ref 11.6–15.2)
Prothrombin Time: 14.6 seconds (ref 11.6–15.2)

## 2015-03-28 LAB — CBC
HEMATOCRIT: 34.9 % — AB (ref 36.0–46.0)
Hemoglobin: 11.3 g/dL — ABNORMAL LOW (ref 12.0–15.0)
MCH: 31.7 pg (ref 26.0–34.0)
MCHC: 32.4 g/dL (ref 30.0–36.0)
MCV: 98 fL (ref 78.0–100.0)
PLATELETS: 251 10*3/uL (ref 150–400)
RBC: 3.56 MIL/uL — ABNORMAL LOW (ref 3.87–5.11)
RDW: 14 % (ref 11.5–15.5)
WBC: 10 10*3/uL (ref 4.0–10.5)

## 2015-03-28 LAB — I-STAT TROPONIN, ED: Troponin i, poc: 0.4 ng/mL (ref 0.00–0.08)

## 2015-03-28 LAB — TROPONIN I: Troponin I: 0.54 ng/mL (ref <0.031)

## 2015-03-28 LAB — TYPE AND SCREEN
ABO/RH(D): O POS
ANTIBODY SCREEN: NEGATIVE

## 2015-03-28 LAB — MRSA PCR SCREENING: MRSA by PCR: NEGATIVE

## 2015-03-28 LAB — GLUCOSE, CAPILLARY: Glucose-Capillary: 116 mg/dL — ABNORMAL HIGH (ref 65–99)

## 2015-03-28 LAB — APTT
APTT: 36 s (ref 24–37)
APTT: 73 s — AB (ref 24–37)

## 2015-03-28 LAB — ABO/RH: ABO/RH(D): O POS

## 2015-03-28 LAB — HEPARIN LEVEL (UNFRACTIONATED)

## 2015-03-28 MED ORDER — ALTEPLASE (PULMONARY EMBOLISM) INFUSION
50.0000 mg | Freq: Once | INTRAVENOUS | Status: DC
  Filled 2015-03-28: qty 50

## 2015-03-28 MED ORDER — SODIUM CHLORIDE 0.9 % IV SOLN: 250.0000 mL | Freq: Once | INTRAVENOUS | Status: AC

## 2015-03-28 MED ORDER — SODIUM CHLORIDE 0.9 % IV BOLUS (SEPSIS)
500.0000 mL | Freq: Once | INTRAVENOUS | Status: AC
  Administered 2015-03-28: 500 mL via INTRAVENOUS

## 2015-03-28 MED ORDER — PHENYTOIN SODIUM EXTENDED 100 MG PO CAPS
100.0000 mg | ORAL_CAPSULE | Freq: Every morning | ORAL | Status: DC
  Administered 2015-03-29 – 2015-04-04 (×7): 100 mg via ORAL
  Filled 2015-03-28 (×7): qty 1

## 2015-03-28 MED ORDER — HEPARIN (PORCINE) IN NACL 100-0.45 UNIT/ML-% IJ SOLN
1200.0000 [IU]/h | INTRAMUSCULAR | Status: DC
  Administered 2015-03-28: 650 [IU]/h via INTRAVENOUS
  Administered 2015-03-29: 1000 [IU]/h via INTRAVENOUS
  Filled 2015-03-28 (×5): qty 250

## 2015-03-28 MED ORDER — PHENYTOIN SODIUM 50 MG/ML IJ SOLN
200.0000 mg | Freq: Every day | INTRAMUSCULAR | Status: DC
  Filled 2015-03-28: qty 4

## 2015-03-28 MED ORDER — WHITE PETROLATUM GEL
Status: AC
  Administered 2015-03-28: 0.2
  Filled 2015-03-28: qty 1

## 2015-03-28 MED ORDER — PREDNISONE 20 MG PO TABS
40.0000 mg | ORAL_TABLET | Freq: Once | ORAL | Status: AC
  Administered 2015-03-28: 40 mg via ORAL
  Filled 2015-03-28: qty 2

## 2015-03-28 MED ORDER — HEPARIN BOLUS VIA INFUSION
2000.0000 [IU] | Freq: Once | INTRAVENOUS | Status: AC
  Administered 2015-03-28: 2000 [IU] via INTRAVENOUS
  Filled 2015-03-28: qty 2000

## 2015-03-28 MED ORDER — DONEPEZIL HCL 10 MG PO TABS
10.0000 mg | ORAL_TABLET | Freq: Every day | ORAL | Status: DC
  Administered 2015-03-29 – 2015-04-04 (×7): 10 mg via ORAL
  Filled 2015-03-28 (×7): qty 1

## 2015-03-28 MED ORDER — FUROSEMIDE 10 MG/ML IJ SOLN
20.0000 mg | Freq: Once | INTRAMUSCULAR | Status: AC
  Administered 2015-03-28: 20 mg via INTRAVENOUS
  Filled 2015-03-28: qty 4

## 2015-03-28 MED ORDER — HEPARIN (PORCINE) IN NACL 100-0.45 UNIT/ML-% IJ SOLN
650.0000 [IU]/h | INTRAMUSCULAR | Status: DC
  Administered 2015-03-28: 650 [IU]/h via INTRAVENOUS
  Filled 2015-03-28: qty 250

## 2015-03-28 MED ORDER — HEPARIN BOLUS VIA INFUSION
3000.0000 [IU] | Freq: Once | INTRAVENOUS | Status: AC
  Administered 2015-03-28: 3000 [IU] via INTRAVENOUS
  Filled 2015-03-28: qty 3000

## 2015-03-28 MED ORDER — PHENYTOIN SODIUM 50 MG/ML IJ SOLN: 100.0000 mg | Freq: Every morning | INTRAMUSCULAR | Status: DC

## 2015-03-28 MED ORDER — PHENYTOIN SODIUM EXTENDED 100 MG PO CAPS
200.0000 mg | ORAL_CAPSULE | Freq: Every day | ORAL | Status: DC
Start: 2015-03-28 — End: 2015-04-04
  Administered 2015-03-28 – 2015-04-03 (×7): 200 mg via ORAL
  Filled 2015-03-28 (×8): qty 2

## 2015-03-28 MED ORDER — SODIUM CHLORIDE 0.9 % IV SOLN: 250.0000 mL | INTRAVENOUS | Status: DC | PRN

## 2015-03-28 MED ORDER — IOHEXOL 350 MG/ML SOLN
100.0000 mL | Freq: Once | INTRAVENOUS | Status: AC | PRN
  Administered 2015-03-28: 80 mL via INTRAVENOUS

## 2015-03-28 MED ORDER — IPRATROPIUM-ALBUTEROL 0.5-2.5 (3) MG/3ML IN SOLN
3.0000 mL | RESPIRATORY_TRACT | Status: AC
  Administered 2015-03-28 (×3): 3 mL via RESPIRATORY_TRACT
  Filled 2015-03-28 (×2): qty 3

## 2015-03-28 MED ORDER — LEVOTHYROXINE SODIUM 100 MCG IV SOLR: 50.0000 ug | Freq: Every day | INTRAVENOUS | Status: DC

## 2015-03-28 MED ORDER — ASPIRIN 81 MG PO CHEW
324.0000 mg | CHEWABLE_TABLET | Freq: Once | ORAL | Status: AC
  Administered 2015-03-28: 324 mg via ORAL
  Filled 2015-03-28: qty 4

## 2015-03-28 MED ORDER — IPRATROPIUM-ALBUTEROL 0.5-2.5 (3) MG/3ML IN SOLN
3.0000 mL | RESPIRATORY_TRACT | Status: AC
  Administered 2015-03-28 (×3): 3 mL via RESPIRATORY_TRACT
  Filled 2015-03-28: qty 9

## 2015-03-28 MED ORDER — NITROGLYCERIN 0.4 MG SL SUBL
0.4000 mg | SUBLINGUAL_TABLET | SUBLINGUAL | Status: AC | PRN
  Administered 2015-03-28 (×3): 0.4 mg via SUBLINGUAL
  Filled 2015-03-28: qty 1

## 2015-03-28 NOTE — Progress Notes (Addendum)
*  PRELIMINARY RESULTS* Vascular Ultrasound Lower extremity venous duplex has been completed.  Preliminary findings: Mobile DVT noted in the left proximal femoral vein extending into the left common femoral vein. Also noted, occlusive DVT bilateral posterior tibial, peroneal, and soleal veins.    Landry Mellow, RDMS, RVT  03/28/2015, 3:44 PM

## 2015-03-28 NOTE — H&P (Addendum)
PULMONARY / CRITICAL CARE MEDICINE   Name: Rebecca [name] MRN: [id number] DOB: [birth year]    ADMISSION DATE:  03/28/2015 CONSULTATION DATE:  03/28/2015  REFERRING MD :  Dr. Deno Etienne [facility]  CHIEF COMPLAINT:  Pulmonary Emboli with Right Heart Strain  INITIAL PRESENTATION: 81 y.o. Female with discharge from hospital on 03/19/15 after being admitted on 03/11/15 with sepsis secondary to pneumonia. Patient reportedly was on Lovenox for DVT prophylaxis during that admission. Prior to that she was living in an assisted living and was discharged to a skilled nursing facility. She had worsening hypoxia & dyspnea at the nursing facility and was sent to the E.D. for further workup.  STUDIES:  CT Head 08/12/09 - Chronic microvascular ischemic changes. No evidence of infarction or mass lesion. CTA Chest 03/28/15 - Filling defects bilateral lobes c/w emboli. RV:LV ratio 1.2. No filling defects in RV. Venous Duplex 03/28/15 - Prelim findings c/w mobile DVT in left proximal femoral vein extending into common femoral. Occlusive DVT bilateral posterior tibial, peroneal, & soleal veins. TTE 03/28/15 - LV normal in size w/ mild LVH. EF 60-65%. No wall motion abnormalities. Grade 1 diastolic dysfunction. LA normal in size. RA mildly dilated. RV mildly dilated with preserved systolic function. PASP 24mHg. No aortic stenosis or regurg. No mitral stenosis or regurg. No pulmonic regurg. Mild tricuspid regurg. No pericardial effusion.  SIGNIFICANT EVENTS: 11/9 - [facility]  HISTORY OF PRESENT ILLNESS:  81year old female with recent admission for pneumonia discharged to a local skilled nursing facility. History obtained through discussion with the emergency department physician as well as the patient's son at bedside. Per report she has had limited mobility after her recent discharge. She has been working with physical therapy but mostly has been sitting  in a chair and laying in bed. Only recently did her appetite started to improve. Patient has had a slight worsening of confusion compared with her baseline but does have cognitive deficits at baseline. The patient herself at this time denies any chest pain or pressure. She denies any difficulty breathing. She denies any abdominal pain or nausea. Both son and patient deny any history of GI bleeding or known peptic ulcer disease. They deny any history of prior stroke. The patient has a long history of seizures but reviewing the patient's prior head CT scan from 2011 showed no evidence for mass lesion or prior infarction. The patient did have chronic microvascular ischemic changes. In the emergency department the patient was hypotensive and hypoxic. She is currently requiring a nonrebreather to maintain her oxygen saturation. She received a 500 mL bolus of normal saline with improvement in her hypotension. Her blood pressure has remained stable thus far. Dr. FTyrone Ninediscussed the case with me after being contacted by radiology subsequent her CT angiogram showing pulmonary emboli. There is indication of right heart strain on the CT angiogram. Additionally, her cardiac biomarkers are elevated. Transthoracic echocardiogram shows largely normal left ventricular, aortic valve, and mitral valve function. Her right ventricle is mildly dilated with an elevated pulmonary artery systolic pressure consistent with her acute pulmonary emboli.  PAST MEDICAL HISTORY :  Past Medical History  Diagnosis Date  . Osteoporosis 02/2011  . Seizures ([place]     Remotely  . DJD (degenerative joint disease)   . Diverticulosis 02/2011  . Hemorrhoids 02/2011  . Dizziness   . Low back pain   . Mitral valve problem thickened  thickened  . Unspecified hypothyroidism 01/2011  . 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 Vitamin D deficiency 01/2011  . Anemia, unspecified 03/18/2011  . Anxiety state, unspecified  01/2011  . 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 Pain in joint, site unspecified 01/2012  . Sacroiliitis, not elsewhere classified ([place]) 05/2011  . Dysuria 06/17/2011  . Other abnormal blood chemistry 0/30/2012  . Blood in stool 06/15/2012  . Other acquired deformity of toe 12/16/2011  . Corns and callosities 07/01/2011   PAST SURGICAL HISTORY: Past Surgical History  Procedure Laterality Date  . Cervical laminectomy  2005  . Bletheroplasty    . Hammer toe surgery  2013    right 2nd toe Dr. Mallie Mussel  . Joint replacement Bilateral 2002 Right, 1992 Left    knees  . Cataract extraction w/ intraocular lens  implant, bilateral  2010    Prior to Admission medications   Medication Sig Start Date End Date Taking? Authorizing Provider  acetaminophen (TYLENOL) 650 MG CR tablet Take 650 mg by mouth 2 (two) times daily. x7 days. Started 03/23/15.   Yes Historical Provider, MD  antiseptic oral rinse (BIOTENE) LIQD 15 mLs by Mouth Rinse route 4 (four) times daily as needed for dry mouth.   Yes Historical Provider, MD  Calcium Carbonate-Vitamin D (CALCIUM 600+D) 600-400 MG-UNIT tablet Take 1 tablet by mouth daily.   Yes Historical Provider, MD  Cholecalciferol (VITAMIN D-3) 5000 UNITS TABS Take 5,000 Units by mouth daily.    Yes Historical Provider, MD  clonazePAM (KLONOPIN) 0.5 MG tablet Take 1 tablet (0.5 mg total) by mouth 2 (two) times daily as needed (anxiety). 03/17/15  Yes Robbie Lis, MD  donepezil (ARICEPT) 10 MG tablet Take 10 mg by mouth daily.   Yes Historical Provider, MD  guaiFENesin (MUCINEX) 600 MG 12 hr tablet Take 600 mg by mouth 2 (two) times daily. For 30 days. Started 03/22/15.   Yes Historical Provider, MD  guaiFENesin-dextromethorphan (ROBITUSSIN DM) 100-10 MG/5ML syrup Take 5 mLs by mouth every 4 (four) hours as needed for cough. 03/17/15  Yes Robbie Lis, MD  lactose free nutrition (BOOST) LIQD Take 237 mLs by mouth 2 (two) times daily. With lunch and  dinner   Yes Historical Provider, MD  levothyroxine (SYNTHROID, LEVOTHROID) 100 MCG tablet Take 1 tablet (100 mcg total) by mouth daily before breakfast. For thyroid 09/28/14  Yes Estill Dooms, MD  Multiple Vitamin (TAB-A-VITE PO) Take 1 tablet by mouth daily.   Yes Historical Provider, MD  omega-3 acid ethyl esters (LOVAZA) 1 G capsule Take 1 g by mouth daily. Fatty Acid one daily   Yes Historical Provider, MD  ondansetron (ZOFRAN) 4 MG tablet Take 1 tablet (4 mg total) by mouth every 6 (six) hours as needed for nausea. 01/12/15  Yes Venetia Maxon Rama, MD  OXYGEN Inhale 5 L into the lungs continuous.   Yes Historical Provider, MD  phenytoin (DILANTIN) 100 MG ER capsule Take 1 capsule by mouth in  the morning and 2 capsules  by mouth at bedtime to prevent seizure. Patient taking differently: Take 100-200 mg by mouth 2 (two) times daily. Take 100 mg in the morning and 200 mg by mouth at bedtime 09/26/14  Yes Estill Dooms, MD  saccharomyces boulardii (FLORASTOR) 250 MG capsule Take 250 mg by mouth 2 (two) times daily.   Yes Historical Provider, MD  sertraline (ZOLOFT) 50 MG tablet One daily to improve anxiety and  depression Patient taking differently: Take 25 mg by mouth daily.  01/30/15  Yes Estill Dooms, MD  traMADol (ULTRAM) 50 MG tablet Take 1 tablet (50 mg total) by mouth every 6 (six) hours as needed for moderate pain or severe pain. 03/17/15  Yes Robbie Lis, MD  levofloxacin (LEVAQUIN) 750 MG tablet Take 1 tablet (750 mg total) by mouth every other day. 03/19/15   Robbie Lis, MD  meloxicam (MOBIC) 15 MG tablet TAKE 1 TABLET BY MOUTH ONCE DAILY TO HELP ARTHRITIS Patient not taking: Reported on 03/28/2015 09/28/14   Estill Dooms, MD   Allergies  Allergen Reactions  . Morphine And Related Other (See Comments)    Altered mental state   . Sulfa Antibiotics Other (See Comments)    Reaction: unknown    FAMILY HISTORY:  Family History  Problem Relation Age of Onset  . Alzheimer's  disease Sister   . Emphysema Sister     SOCIAL HISTORY: Social History   Social History  . Marital Status: Widowed    Spouse Name: N/A  . Number of Children: N/A  . Years of Education: N/A   Social History Main Topics  . Smoking status: Former Smoker    Quit date: 09/14/1988  . Smokeless tobacco: Never Used     Comment: Smoked <1ppd  . Alcohol Use: 0.6 oz/week    1 Glasses of wine per week     Comment: doesn't drink routinely & only on social occasions  . Drug Use: No  . Sexual Activity: No   Other Topics Concern  . None   Social History Narrative   Lives at [facility] since 10/07/2010, moved to [place] 01/16/2015   Widowed   Exercise class 3 times a week     Never smoked   Alcholol wine social    POA, Living Will       Pulmonary/CC:   Primary MPOA:  Christell Faith (Daughter) - [phone]   Secondary MPOA:  Kalla Watson [facility]) - [phone]    REVIEW OF SYSTEMS:  Unable to obtain an accurate review of systems given altered mentation and baseline cognitive deficits.  SUBJECTIVE:   VITAL SIGNS: Temp:  [98 F (36.7 C)] 98 F (36.7 C) (11/09 1058) Pulse Rate:  [79-94] 89 (11/09 1800) Resp:  [18-31] 24 (11/09 1800) BP: (80-116)/(43-69) 116/69 mmHg (11/09 1800) SpO2:  [79 %-100 %] 99 % (11/09 1800) FiO2 (%):  [40 %] 40 % (11/09 1126) Weight:  [58.06 kg (128 lb)] 58.06 kg (128 lb) (11/09 1218) HEMODYNAMICS:   VENTILATOR SETTINGS: Vent Mode:  [-]  FiO2 (%):  [40 %] 40 % INTAKE / OUTPUT: No intake or output data in the 24 hours ending 03/28/15 1832  PHYSICAL EXAMINATION: General:  Sleeping until awoken. No acute distress. Son at bedside.  Integument:  Warm & dry. No rash on exposed skin. No bruising. Lymphatics:  No appreciated cervical or supraclavicular lymphadenoapthy. HEENT:  Dry mucus membranes. No oral ulcers. No scleral injection or icterus.  Cardiovascular:  Regular rate. No edema. No appreciable JVD.  Pulmonary:  Mild bilateral basilar  crackles. Symmetric chest wall expansion. No accessory muscle use on nonrebreather mask. Abdomen: Soft. Normal bowel sounds. Nondistended. Grossly nontender. Musculoskeletal:  Normal bulk and tone. Hand grip strength 5/5 bilaterally. No joint deformity or effusion appreciated. Neurological:  CN 2-12 grossly in tact. No meningismus. Moving all 4 extremities equally.  Psychiatric:  Mood and affect congruent. Speech normal rhythm, rate & tone. Patient oriented to  place & year. Becomes confused and tearful easily with the discussion of her current clinical state along with other medical questions. She deferred to son repeatedly to answer questions.  LABS:  CBC  Recent Labs Lab 03/23/15 03/28/15 1138  WBC 11.5 12.3*  HGB 11.5* 12.7  HCT 34* 38.8  PLT 266 255   Coag's  Recent Labs Lab 03/28/15 1137  APTT 36  INR 1.13   BMET  Recent Labs Lab 03/23/15 03/28/15 1138  NA 138 138  K 4.0 4.6  CL  --  103  CO2  --  27  BUN 14 19  CREATININE 0.6 0.77  GLUCOSE  --  141*   Electrolytes  Recent Labs Lab 03/28/15 1138  CALCIUM 9.0   Sepsis Markers No results for input(s): LATICACIDVEN, PROCALCITON, O2SATVEN in the last 168 hours. ABG No results for input(s): PHART, PCO2ART, PO2ART in the last 168 hours. Liver Enzymes  Recent Labs Lab 03/23/15  AST 26  ALT 19  ALKPHOS 83   Cardiac Enzymes  Recent Labs Lab 03/28/15 1452  TROPONINI 0.54*   Glucose No results for input(s): GLUCAP in the last 168 hours.  Imaging Dg Chest 2 View  03/28/2015  CLINICAL DATA:  Low oxygen.  Increased short of breath. EXAM: CHEST  2 VIEW COMPARISON:  CT 03/16/2015, chest radiograph 03/16/2015 FINDINGS: Normal cardiac silhouette. There is bilateral interstitial lung disease. There is slight increase in density in the upper lobes suggesting superimposed infection or edema. No pneumothorax. IMPRESSION: Interstitial lung disease with superimposed mild pulmonary edema versus pneumonia.  Electronically Signed   By: Suzy Bouchard M.D.   On: 03/28/2015 12:31   Ct Angio Chest Pe W/cm &/or Wo Cm  03/28/2015  CLINICAL DATA:  Shortness of Breath EXAM: CT ANGIOGRAPHY CHEST WITH CONTRAST TECHNIQUE: Multidetector CT imaging of the chest was performed using the standard protocol during bolus administration of intravenous contrast. Multiplanar CT image reconstructions and MIPs were obtained to evaluate the vascular anatomy. CONTRAST:  81m OMNIPAQUE IOHEXOL 350 MG/ML SOLN COMPARISON:  03/16/2015 FINDINGS: The lungs are well expanded bilaterally. There again noted diffuse chronic fibrotic changes bilaterally. The degree of chronic last infiltrate has improved in the interval. Multiple calcified granulomas are seen. The previously seen pleural effusions have also resolved in the interval. The thoracic inlet shows no acute abnormality. The thoracic aorta demonstrates a normal branching pattern. Some calcific changes are seen without aneurysmal dilatation or signs of dissection. The coronary arteries demonstrate mild calcifications. The pulmonary artery is well visualized and demonstrates multiple filling defects throughout the lower lobes bilaterally consistent with pulmonary emboli. There is prominence of the right ventricle with evidence of right heart strain. The RV to LV ratio is 1.2. This is indicative of sub massive pulmonary embolism and right heart strain. No definitive filling defects are noted within the right ventricle. There are lymph node is again identified in the aorticopulmonary window as well as in the left suprahilar region stable from the prior exam. No new focal adenopathy is noted. The visualized upper abdomen is within normal limits. Degenerative changes of thoracic spine are again seen and stable. Review of the MIP images confirms the above findings. IMPRESSION: Positive for acute PE with CT evidence of right heart strain (RV/LV Ratio = 1.2) consistent with at least submassive  (intermediate risk) PE. The presence of right heart strain has been associated with an increased risk of morbidity and mortality. Please activate Code PE by paging [phone] Chronic fibrotic changes in the lungs bilaterally.  Otherwise stable appearance from the prior study. These results were called by telephone at the time of interpretation on 03/28/2015 at 1:57 pm to Dr. Deno Etienne , who verbally acknowledged these results. Electronically Signed   By: Inez Catalina M.D.   On: 03/28/2015 13:58     ASSESSMENT / PLAN:  PULMONARY A: Acute Bilateral Pulmonary Emboli Acute Hypoxic Respiratory Failure - Secondary to acute pulmonary emboli.  P:   Heparin drip per pharmacy protocol Repeat CBC AM 11/10 IR Consult to assess for possible treatment with EKOS O2 to maintain Sat >92%  CARDIOVASCULAR A:  Cor Pulmonale - Likely secondary to acute pulmonary emboli.  P:  Monitor on Telemetry Therapeutic anticoagulation with Heparin per pharmacy protocol  RENAL A:   No acute issues.  P:   Monitor UOP No foley catheter Monitor renal function with BUN/Creatinine given IV contrast today & Lasix  GASTROINTESTINAL A:   No acute issues.  P:   NPO  HEMATOLOGIC A:   Systemic Anticoagulation for PE Acute Pulmonary Emboli - Likely provoked. LLE DVT - Mobile clot.  P:  Monitor Hgb with CBC in AM 11/10 Holding off on hypercoagulable workup  INFECTIOUS A:   No acute issues. Recent treatment for pneumonia.  P:   Monitor for fever & leukocytosis.  ENDOCRINE A:   H/O Hypothryoidism    P:   Plan to restart Synthroid once taking PO  NEUROLOGIC A:   Chronic Cognitive/Memory Deficit - Probable dementia. Chronic Benzodiazepine Use H/O Seizure D/O - None in years.  P:   Monitor for signs of Benzo withdrawal Plan to restart Aricept & Zoloft once taking PO Plan to restart Dilantin once taking PO Neuro Checks q2hr for now Seizure precautions  FAMILY  - Updates: Son [name]) &  Daughter Izora Gala) updated this evening by Dr. Ashok Cordia.  - Inter-disciplinary family meet or Palliative Care meeting due by:  11/16   TODAY'S SUMMARY: Unfortunate 81 year old female with discharge from hospital on 03/19/15. Patient has largely been immobile since that discharge likely contributing to the acute DVT in her left lower extremity as well as the bilateral pulmonary emboli resulting in cor pulmonale. Echocardiogram shows significant elevation of the patient's pulmonary artery systolic pressure as well as mild dilation of the right ventricle consistent with cor pulmonale seen on her chest CT angiogram. I had a very lengthy discussion with the patient's daughter and son this evening. We discussed continued systemic anticoagulation with heparin versus IV lytic therapy versus catheter directed lytic therapy with interventional radiology. Given the potential risks of bleeding and the patient's advanced age and her children thought the catheter directed lytic therapy would be the most reasonable option with all risk factors included. I discussed the case with interventional radiology as well as with Dr. Tyrone Nine. The patient will subsequently be transferred to [facility] for evaluation by interventional radiology in consideration of catheter directed lytic therapy. They are fully aware of the potential risks of bleeding from the wound continued systemic anticoagulation. Additionally, they have reconfirmed the patient's DO NOT INTUBATE/DO NOT RESUSCITATE status.   I  have spent a total of 65 minutes today discussing the plan of care and options with the patient's children, caring for the patient, and reviewing the patient's electronic medical record.  Sonia Baller Ashok Cordia, M.D. [facility] Pulmonary [facility] Critical Care Pager:  [phone] After 3pm or if no response, call [phone]  03/28/2015, 6:32 PM

## 2015-03-28 NOTE — ED Notes (Signed)
Bed: HK74 Expected date:  Expected time:  Means of arrival:  Comments: EMS- 81yo F, SOB

## 2015-03-28 NOTE — Progress Notes (Signed)
ANTICOAGULATION CONSULT NOTE - Follow Up Consult  Pharmacy Consult for Heparin Indication: pulmonary embolus  Allergies  Allergen Reactions  . Morphine And Related Other (See Comments)    Altered mental state   . Sulfa Antibiotics Other (See Comments)    Reaction: unknown    Patient Measurements: Height: '5\' 5"'$  (165.1 cm) Weight: 128 lb (58.06 kg) IBW/kg (Calculated) : 57  Vital Signs: Temp: 98 F (36.7 C) (11/09 1058) Temp Source: Oral (11/09 1058) BP: 100/63 mmHg (11/09 1917) Pulse Rate: 83 (11/09 1917)  Labs:  Recent Labs  03/28/15 1137 03/28/15 1138 03/28/15 1452 03/28/15 1913 03/28/15 2120  HGB  --  12.7  --  11.3*  --   HCT  --  38.8  --  34.9*  --   PLT  --  255  --  251  --   APTT 36  --   --  73*  --   LABPROT 14.6  --   --  15.4*  --   INR 1.13  --   --  1.20  --   HEPARINUNFRC  --   --   --   --  <0.10*  CREATININE  --  0.77  --   --   --   TROPONINI  --   --  0.54*  --   --     Estimated Creatinine Clearance: 46.3 mL/min (by C-G formula based on Cr of 0.77).   Assessment: 81 year old female transferred from [facility] on heparin for PE. Initial heparin level low at <0.10, heparin running appropriately per RN She has 2 IV sites, one was pulled out earlier, unsure if heparin running through that line  Goal of Therapy:  Heparin level 0.3-0.7 units/ml Monitor platelets by anticoagulation protocol: Yes   Plan:  Small heparin bolus of 2000 units iv x 1 Heparin to 800 units / hr 8 hour heparin level  Thank you Anette Guarneri, PharmD [phone]  03/28/2015,10:17 PM

## 2015-03-28 NOTE — Progress Notes (Signed)
eLink Physician-Brief Progress Note Patient Name: Rebecca [name] DOB: [birth year] MRN: [id number]   Date of Service  03/28/2015  HPI/Events of Note  I reviewed ct, echo, camera in  Some distress, BP ok RV not failing RV/LV ratio low Echo mild Rt heart changes D/w son Keep heparin I reviewed with IR in full Catheter doesn't reach these clots well Risk /nbenefit ratio does not support ekos now  eICU Interventions  Risk ICH noted age etc D/w son agrees  If MAJOR declines would consider 1/2 dose TPA Mobile clot, likley needs filter , re assess in am      Intervention Category Major Interventions: Respiratory failure - evaluation and management  FEINSTEIN,DANIEL J. 03/28/2015, 10:01 PM

## 2015-03-28 NOTE — Progress Notes (Signed)
MEDICATION RELATED CONSULT NOTE - INITIAL   Pharmacy Consult for phenytoin Indication: History of seizure disorder - PTA phenytoin  Allergies  Allergen Reactions  . Morphine And Related Other (See Comments)    Altered mental state   . Sulfa Antibiotics Other (See Comments)    Reaction: unknown    Patient Measurements: Height: '5\' 5"'$  (165.1 cm) Weight: 128 lb (58.06 kg) IBW/kg (Calculated) : 57  Vital Signs: Temp: 98 F (36.7 C) (11/09 1058) Temp Source: Oral (11/09 1058) BP: 100/63 mmHg (11/09 1917) Pulse Rate: 83 (11/09 1917) Intake/Output from previous day:   Intake/Output from this shift:    Labs:  Recent Labs  03/28/15 1137 03/28/15 1138 03/28/15 1913  WBC  --  12.3* 10.0  HGB  --  12.7 11.3*  HCT  --  38.8 34.9*  PLT  --  255 251  APTT 36  --  73*  CREATININE  --  0.77  --    Estimated Creatinine Clearance: 46.3 mL/min (by C-G formula based on Cr of 0.77).   Microbiology: Recent Results (from the past 720 hour(s))  Blood Culture (routine x 2)     Status: None   Collection Time: 03/11/15  6:30 PM  Result Value Ref Range Status   Specimen Description RIGHT ANTECUBITAL  Final   Special Requests BOTTLES DRAWN AEROBIC AND ANAEROBIC 5CC  Final   Culture   Final    NO GROWTH 5 DAYS Performed at [facility]    Report Status 03/17/2015 FINAL  Final  Blood Culture (routine x 2)     Status: None   Collection Time: 03/11/15  6:35 PM  Result Value Ref Range Status   Specimen Description BLOOD RIGHT FOREARM  Final   Special Requests BOTTLES DRAWN AEROBIC AND ANAEROBIC 5CC  Final   Culture   Final    NO GROWTH 5 DAYS Performed at [facility]    Report Status 03/17/2015 FINAL  Final  Urine culture     Status: None   Collection Time: 03/11/15  7:26 PM  Result Value Ref Range Status   Specimen Description URINE, CATHETERIZED  Final   Special Requests NONE  Final   Culture   Final    NO GROWTH 1 DAY Performed at [facility]    Report Status 03/13/2015 FINAL  Final  MRSA PCR Screening     Status: None   Collection Time: 03/12/15  1:08 AM  Result Value Ref Range Status   MRSA by PCR NEGATIVE NEGATIVE Final    Comment:        The GeneXpert MRSA Assay (FDA approved for NASAL specimens only), is one component of a comprehensive MRSA colonization surveillance program. It is not intended to diagnose MRSA infection nor to guide or monitor treatment for MRSA infections.     Medical History: Past Medical History  Diagnosis Date  . Osteoporosis 02/2011  . Seizures ([place])     Remotely  . DJD (degenerative joint disease)   . Diverticulosis 02/2011  . Hemorrhoids 02/2011  . Dizziness   . Low back pain   . Mitral valve problem thickened    thickened  . Unspecified hypothyroidism 01/2011  . 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 Vitamin D deficiency 01/2011  . Anemia, unspecified 03/18/2011  . Anxiety state, unspecified 01/2011  . 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 Pain in joint, site unspecified 01/2012  . Sacroiliitis, not elsewhere classified ([place]) 05/2011  .  Dysuria 06/17/2011  . Other abnormal blood chemistry 0/30/2012  . Blood in stool 06/15/2012  . Other acquired deformity of toe 12/16/2011  . Corns and callosities 07/01/2011    Medications:  Prescriptions prior to admission  Medication Sig Dispense Refill Last Dose  . acetaminophen (TYLENOL) 650 MG CR tablet Take 650 mg by mouth 2 (two) times daily. x7 days. Started 03/23/15.   03/28/2015 at Unknown time  . antiseptic oral rinse (BIOTENE) LIQD 15 mLs by Mouth Rinse route 4 (four) times daily as needed for dry mouth.   unknown  . Calcium Carbonate-Vitamin D (CALCIUM 600+D) 600-400 MG-UNIT tablet Take 1 tablet by mouth daily.   03/27/2015 at Unknown time  . Cholecalciferol (VITAMIN D-3) 5000 UNITS TABS Take 5,000 Units by mouth daily.    03/28/2015 at Unknown time  . clonazePAM (KLONOPIN) 0.5 MG  tablet Take 1 tablet (0.5 mg total) by mouth 2 (two) times daily as needed (anxiety). 10 tablet 0 03/28/2015 at Unknown time  . donepezil (ARICEPT) 10 MG tablet Take 10 mg by mouth daily.   03/28/2015 at Unknown time  . guaiFENesin (MUCINEX) 600 MG 12 hr tablet Take 600 mg by mouth 2 (two) times daily. For 30 days. Started 03/22/15.   03/28/2015 at Unknown time  . guaiFENesin-dextromethorphan (ROBITUSSIN DM) 100-10 MG/5ML syrup Take 5 mLs by mouth every 4 (four) hours as needed for cough. 118 mL 0 unknown  . lactose free nutrition (BOOST) LIQD Take 237 mLs by mouth 2 (two) times daily. With lunch and dinner   03/27/2015 at Unknown time  . levothyroxine (SYNTHROID, LEVOTHROID) 100 MCG tablet Take 1 tablet (100 mcg total) by mouth daily before breakfast. For thyroid 90 tablet 3 03/28/2015 at Unknown time  . Multiple Vitamin (TAB-A-VITE PO) Take 1 tablet by mouth daily.   03/28/2015 at Unknown time  . omega-3 acid ethyl esters (LOVAZA) 1 G capsule Take 1 g by mouth daily. Fatty Acid one daily   03/28/2015 at Unknown time  . ondansetron (ZOFRAN) 4 MG tablet Take 1 tablet (4 mg total) by mouth every 6 (six) hours as needed for nausea. 20 tablet 0 unknown  . OXYGEN Inhale 5 L into the lungs continuous.   03/28/2015 at Unknown time  . phenytoin (DILANTIN) 100 MG ER capsule Take 1 capsule by mouth in  the morning and 2 capsules  by mouth at bedtime to prevent seizure. (Patient taking differently: Take 100-200 mg by mouth 2 (two) times daily. Take 100 mg in the morning and 200 mg by mouth at bedtime) 270 capsule 3 03/28/2015 at Unknown time  . saccharomyces boulardii (FLORASTOR) 250 MG capsule Take 250 mg by mouth 2 (two) times daily.   03/28/2015 at Unknown time  . sertraline (ZOLOFT) 50 MG tablet One daily to improve anxiety and depression (Patient taking differently: Take 25 mg by mouth daily. ) 30 tablet 5 03/28/2015 at Unknown time  . traMADol (ULTRAM) 50 MG tablet Take 1 tablet (50 mg total) by mouth every 6 (six)  hours as needed for moderate pain or severe pain. 30 tablet 0 unknown  . levofloxacin (LEVAQUIN) 750 MG tablet Take 1 tablet (750 mg total) by mouth every other day. 4 tablet 0   . meloxicam (MOBIC) 15 MG tablet TAKE 1 TABLET BY MOUTH ONCE DAILY TO HELP ARTHRITIS (Patient not taking: Reported on 03/28/2015) 90 tablet 4 03/11/2015 at 0800    Assessment: Rebecca [name] is an 65 yoF admitted 03/28/2015 with history of seizure disorder on phenytoin  prior to admission. Patient has not had a seizure in years. At home she takes Phenytoin 100 mg qAM and 200 mg qPM. No recent phenytoin level yet. Level ordered for tomorrow morning.  Plan:  - Resume home dose of phenytoin 100 mg qAM and 200 mg qPM - Monitor phenytoin level and seizure activity  Dimitri Ped, PharmD. Clinical Pharmacist Resident Pager: [phone]  03/28/2015,9:47 PM

## 2015-03-28 NOTE — ED Notes (Signed)
Pt presents from [facility] facility with c/o shortness of breath. Pt was discharged from this facility recently diagnosed with pneumonia. Pt normally wears 5L of O2 at home, hx of COPD and emphysema. Pt's facility reported she was found without O2 this morning, room O2 sat of 86%. Transferred for evaluation. Pt reports that she feels fine.

## 2015-03-28 NOTE — Progress Notes (Signed)
Echocardiogram 2D Echocardiogram has been performed.  Rebecca [name] 03/28/2015, 4:11 PM

## 2015-03-28 NOTE — Consult Note (Signed)
Reason for Consult:   Elevated Troponin, abnormal EKG  Requesting Physician: ED Primary Cardiologist New- Dr Gwenlyn Found  HPI:   Pleasant 81 y/o female with a history of memory deficit and depression (and I suspect mild dementia) we are asked to see for elevated Troponin and new EKG changes. The pt was in the hospital in Aug with hip pain (no fracture). She was previously independent but had to go to SNF after that hospitalization. She improved and was transferred to [facility] assisted living. I She was admitted again 03/11/15 03/19/15 with HCAP. She was discharge back to SNF. She has no prior history of CAD or MI. No prior cardiac work up.  She was sent from the nursing home to the ED today for dyspnea and hypoxia. The pt denies any chest pain. She is not really sure she is any more SOB than usual. In the ED her Troponin is 0.4 and her EKG shows new TWI in the inferior, septal leads.   PMHx:  Past Medical History  Diagnosis Date  . Osteoporosis 02/2011  . Seizures ([place])   . DJD (degenerative joint disease)   . Diverticulosis 02/2011  . Hemorrhoids 02/2011  . Dizziness   . Low back pain   . Mitral valve problem thickened    thickened  . Unspecified hypothyroidism 01/2011  . 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 Vitamin D deficiency 01/2011  . Anemia, unspecified 03/18/2011  . Anxiety state, unspecified 01/2011  . 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 Pain in joint, site unspecified 01/2012  . Sacroiliitis, not elsewhere classified ([place]) 05/2011  . Dysuria 06/17/2011  . Other abnormal blood chemistry 0/30/2012  . Blood in stool 06/15/2012  . Other acquired deformity of toe 12/16/2011  . Corns and callosities 07/01/2011    Past Surgical History  Procedure Laterality Date  . Cervical laminectomy  2005  . Bletheroplasty    . Hammer toe surgery  2013    right 2nd toe Dr. Mallie Mussel  . Joint replacement Bilateral 2002  Right, 1992 Left    knees  . Cataract extraction w/ intraocular lens  implant, bilateral  2010    SOCHx:  reports that she quit smoking about 26 years ago. She has never used smokeless tobacco. She reports that she drinks about 0.6 oz of alcohol per week. She reports that she does not use illicit drugs.  FAMHx: Family History  Problem Relation Age of Onset  . Alzheimer's disease Sister   . Emphysema Sister     ALLERGIES: Allergies  Allergen Reactions  . Morphine And Related Other (See Comments)    Altered mental state   . Sulfa Antibiotics Other (See Comments)    Reaction: unknown    ROS: Review of Systems: Unobtainable due to pt factors- pt is demented, history and ROS unreliable  HOME MEDICATIONS: Prior to Admission medications   Medication Sig Start Date End Date Taking? Authorizing Provider  acetaminophen (TYLENOL) 650 MG CR tablet Take 650 mg by mouth 2 (two) times daily. x7 days. Started 03/23/15.   Yes Historical Provider, MD  antiseptic oral rinse (BIOTENE) LIQD 15 mLs by Mouth Rinse route 4 (four) times daily as needed for dry mouth.   Yes Historical Provider, MD  Calcium Carbonate-Vitamin D (CALCIUM 600+D) 600-400 MG-UNIT tablet Take 1 tablet by mouth daily.   Yes Historical Provider, MD  Cholecalciferol (VITAMIN D-3) 5000 UNITS TABS Take 5,000 Units  by mouth daily.    Yes Historical Provider, MD  clonazePAM (KLONOPIN) 0.5 MG tablet Take 1 tablet (0.5 mg total) by mouth 2 (two) times daily as needed (anxiety). 03/17/15  Yes Robbie Lis, MD  donepezil (ARICEPT) 10 MG tablet Take 10 mg by mouth daily.   Yes Historical Provider, MD  guaiFENesin (MUCINEX) 600 MG 12 hr tablet Take 600 mg by mouth 2 (two) times daily. For 30 days. Started 03/22/15.   Yes Historical Provider, MD  guaiFENesin-dextromethorphan (ROBITUSSIN DM) 100-10 MG/5ML syrup Take 5 mLs by mouth every 4 (four) hours as needed for cough. 03/17/15  Yes Robbie Lis, MD  lactose free nutrition (BOOST) LIQD  Take 237 mLs by mouth 2 (two) times daily. With lunch and dinner   Yes Historical Provider, MD  levothyroxine (SYNTHROID, LEVOTHROID) 100 MCG tablet Take 1 tablet (100 mcg total) by mouth daily before breakfast. For thyroid 09/28/14  Yes Estill Dooms, MD  Multiple Vitamin (TAB-A-VITE PO) Take 1 tablet by mouth daily.   Yes Historical Provider, MD  omega-3 acid ethyl esters (LOVAZA) 1 G capsule Take 1 g by mouth daily. Fatty Acid one daily   Yes Historical Provider, MD  ondansetron (ZOFRAN) 4 MG tablet Take 1 tablet (4 mg total) by mouth every 6 (six) hours as needed for nausea. 01/12/15  Yes Venetia Maxon Rama, MD  OXYGEN Inhale 5 L into the lungs continuous.   Yes Historical Provider, MD  phenytoin (DILANTIN) 100 MG ER capsule Take 1 capsule by mouth in  the morning and 2 capsules  by mouth at bedtime to prevent seizure. Patient taking differently: Take 100-200 mg by mouth 2 (two) times daily. Take 100 mg in the morning and 200 mg by mouth at bedtime 09/26/14  Yes Estill Dooms, MD  saccharomyces boulardii (FLORASTOR) 250 MG capsule Take 250 mg by mouth 2 (two) times daily.   Yes Historical Provider, MD  sertraline (ZOLOFT) 50 MG tablet One daily to improve anxiety and depression Patient taking differently: Take 25 mg by mouth daily.  01/30/15  Yes Estill Dooms, MD  traMADol (ULTRAM) 50 MG tablet Take 1 tablet (50 mg total) by mouth every 6 (six) hours as needed for moderate pain or severe pain. 03/17/15  Yes Robbie Lis, MD  levofloxacin (LEVAQUIN) 750 MG tablet Take 1 tablet (750 mg total) by mouth every other day. 03/19/15   Robbie Lis, MD  meloxicam (MOBIC) 15 MG tablet TAKE 1 TABLET BY MOUTH ONCE DAILY TO HELP ARTHRITIS Patient not taking: Reported on 03/28/2015 09/28/14   Estill Dooms, MD    HOSPITAL MEDICATIONS: I have reviewed the patient's current medications.  VITALS: Blood pressure 82/48, pulse 90, temperature 98 F (36.7 C), temperature source Oral, resp. rate 27, height '5\' 5"'$   (1.651 m), weight 128 lb (58.06 kg), SpO2 100 %.  PHYSICAL EXAM: General appearance: alert, cooperative, cachectic and no distress Neck: no carotid bruit and no JVD Lungs: diffuse rales Heart: regular rate and rhythm Abdomen: soft, non-tender; bowel sounds normal; no masses,  no organomegaly Extremities: extremities normal, atraumatic, no cyanosis or edema Pulses: 2+ and symmetric Skin: Skin color, texture, turgor normal. No rashes or lesions Neurologic: Grossly normal  LABS: Results for orders placed or performed during the hospital encounter of 03/28/15 (from the past 24 hour(s))  APTT     Status: None   Collection Time: 03/28/15 11:37 AM  Result Value Ref Range   aPTT 36 24 - 37 seconds  Protime-INR     Status: None   Collection Time: 03/28/15 11:37 AM  Result Value Ref Range   Prothrombin Time 14.6 11.6 - 15.2 seconds   INR 1.13 0.00 - 9.56  Basic metabolic panel     Status: Abnormal   Collection Time: 03/28/15 11:38 AM  Result Value Ref Range   Sodium 138 135 - 145 mmol/L   Potassium 4.6 3.5 - 5.1 mmol/L   Chloride 103 101 - 111 mmol/L   CO2 27 22 - 32 mmol/L   Glucose, Bld 141 (H) 65 - 99 mg/dL   BUN 19 6 - 20 mg/dL   Creatinine, Ser 0.77 0.44 - 1.00 mg/dL   Calcium 9.0 8.9 - 10.3 mg/dL   GFR calc non Af Amer >60 >60 mL/min   GFR calc Af Amer >60 >60 mL/min   Anion gap 8 5 - 15  Brain natriuretic peptide     Status: Abnormal   Collection Time: 03/28/15 11:38 AM  Result Value Ref Range   B Natriuretic Peptide 495.8 (H) 0.0 - 100.0 pg/mL  CBC with Differential     Status: Abnormal   Collection Time: 03/28/15 11:38 AM  Result Value Ref Range   WBC 12.3 (H) 4.0 - 10.5 K/uL   RBC 3.93 3.87 - 5.11 MIL/uL   Hemoglobin 12.7 12.0 - 15.0 g/dL   HCT 38.8 36.0 - 46.0 %   MCV 98.7 78.0 - 100.0 fL   MCH 32.3 26.0 - 34.0 pg   MCHC 32.7 30.0 - 36.0 g/dL   RDW 14.1 11.5 - 15.5 %   Platelets 255 150 - 400 K/uL   Neutrophils Relative % 66 %   Neutro Abs 8.3 (H) 1.7 - 7.7  K/uL   Lymphocytes Relative 12 %   Lymphs Abs 1.4 0.7 - 4.0 K/uL   Monocytes Relative 10 %   Monocytes Absolute 1.2 (H) 0.1 - 1.0 K/uL   Eosinophils Relative 11 %   Eosinophils Absolute 1.4 (H) 0.0 - 0.7 K/uL   Basophils Relative 1 %   Basophils Absolute 0.1 0.0 - 0.1 K/uL  I-stat troponin, ED     Status: Abnormal   Collection Time: 03/28/15 11:46 AM  Result Value Ref Range   Troponin i, poc 0.40 (HH) 0.00 - 0.08 ng/mL   Comment NOTIFIED PHYSICIAN    Comment 3            EKG: NSR- new TWI 2,3,F and V2-V3  IMAGING: Dg Chest 2 View  03/28/2015  CLINICAL DATA:  Low oxygen.  Increased short of breath. EXAM: CHEST  2 VIEW COMPARISON:  CT 03/16/2015, chest radiograph 03/16/2015 FINDINGS: Normal cardiac silhouette. There is bilateral interstitial lung disease. There is slight increase in density in the upper lobes suggesting superimposed infection or edema. No pneumothorax. IMPRESSION: Interstitial lung disease with superimposed mild pulmonary edema versus pneumonia. Electronically Signed   By: Suzy Bouchard M.D.   On: 03/28/2015 12:31    IMPRESSION: Principal Problem:   Acute respiratory failure with hypoxia (HCC) Active Problems:   Elevated troponin   Abnormal finding on EKG-new inferior/ septal TWI   Memory impairment- SNF/ assited living pt   Healthcare-associated pneumonia-hospitalized 10/23- 03/19/15   DNR no code (do not resuscitate)   Hypothyroidism   Depression   Seizure disorder ([place])   RECOMMENDATION: Conservative Rx, continue Heparin, cycle enzymes, Lasix 20 mg IV x 1 (B/P is soft), check echo.  Note: CTA ordered by ED apparently shows bilateral PE. Reviewed with Dr Gwenlyn Found. Would  continue with Rx as noted above.   Time Spent Directly with Patient: 40 minutes  Kerin Ransom, [place] beeper 03/28/2015, 1:58 PM    Agree with note written by Kerin Ransom [facility]  Pt with recent admission for CAP, living in a SNF with on going O2 requirements admitted with  worsening respiratory insufficiency. CXR shows bilateral diffuse pulm infiltrates, trop mildly elevated and EKG shows new inferolatertal TWI c/w ischemia. Pt is a DNR. Chest CT shows submassive Bilat PEs with RV strain. She is on IV hep. Agree with consideration for rTPA. Will get 2D. No plans for invasive cardiac eval. Will continue to follow.  Quay Burow 03/28/2015 2:40 PM

## 2015-03-28 NOTE — ED Notes (Addendum)
Pt acting aggressive; appears to be somewhat confused/forgetful; safety sitter at bedside; pt pulled IV out

## 2015-03-28 NOTE — ED Notes (Signed)
MD at bedside. 

## 2015-03-28 NOTE — ED Notes (Signed)
EDP made aware of pt's BP. Verbal order obtained for fluid bolus of 582m.

## 2015-03-28 NOTE — Progress Notes (Signed)
ANTICOAGULATION CONSULT NOTE - Initial Consult  Pharmacy Consult for heparin Indication: chest pain/ACS  Allergies  Allergen Reactions  . Morphine And Related Other (See Comments)    Altered mental state   . Sulfa Antibiotics Other (See Comments)    Reaction: unknown    Patient Measurements: Height: '5\' 5"'$  (165.1 cm) Weight: 128 lb (58.06 kg) IBW/kg (Calculated) : 57 Heparin Dosing Weight: 57.2  Vital Signs: Temp: 98 F (36.7 C) (11/09 1058) Temp Source: Oral (11/09 1058) BP: 80/43 mmHg (11/09 1245) Pulse Rate: 94 (11/09 1245)  Labs:  Recent Labs  03/28/15 1137 03/28/15 1138  HGB  --  12.7  HCT  --  38.8  PLT  --  255  APTT 36  --   LABPROT 14.6  --   INR 1.13  --   CREATININE  --  0.77    Estimated Creatinine Clearance: 46.3 mL/min (by C-G formula based on Cr of 0.77).   Medical History: Past Medical History  Diagnosis Date  . Osteoporosis 02/2011  . Seizures ([place])   . DJD (degenerative joint disease)   . Diverticulosis 02/2011  . Hemorrhoids 02/2011  . Dizziness   . Low back pain   . Mitral valve problem thickened    thickened  . Unspecified hypothyroidism 01/2011  . 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 Vitamin D deficiency 01/2011  . Anemia, unspecified 03/18/2011  . Anxiety state, unspecified 01/2011  . 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 Pain in joint, site unspecified 01/2012  . Sacroiliitis, not elsewhere classified ([place]) 05/2011  . Dysuria 06/17/2011  . Other abnormal blood chemistry 0/30/2012  . Blood in stool 06/15/2012  . Other acquired deformity of toe 12/16/2011  . Corns and callosities 07/01/2011    Medications:  Scheduled:    Assessment: Pt is a 81 yo female diagnosed being trip with heparin infusion for possible NSTEMI. Pt had recent admission with sepsis due to PNA. Baseline labs all drawn, all WNL. No noted anticoagulation in PTA med list.     Goal of Therapy:  Heparin  level 0.3-0.7 units/ml Monitor platelets by anticoagulation protocol: Yes   Plan:  -- Start heparin 3000 unit bolus, then start heparin drip at 650 units/hr.  -- Heparin level 8 hours after start due at 2100 -- Daily heparin level -- Daily CBC -- Signs/symptoms of bleeding    Royetta Asal, PharmD, BCPS Pager [phone] 03/28/2015, 1:06 PM

## 2015-03-28 NOTE — ED Notes (Signed)
Patient transported to CT 

## 2015-03-28 NOTE — ED Provider Notes (Signed)
CSN: [id number]     Arrival date & time 03/28/15  1045 History   First MD Initiated Contact with Patient 03/28/15 1049     Chief Complaint  Patient presents with  . Shortness of Breath     (Consider location/radiation/quality/duration/timing/severity/associated sxs/prior Treatment) Patient is a 81 y.o. female presenting with shortness of breath. The history is provided by the patient.  Shortness of Breath Severity:  Severe Onset quality:  Sudden Duration:  2 hours Timing:  Constant Progression:  Unchanged Chronicity:  New Relieved by:  Nothing Worsened by:  Nothing tried Ineffective treatments:  None tried Associated symptoms: no chest pain, no cough, no fever, no headaches, no sputum production, no vomiting and no wheezing     81 yo F with a chief complaint shortness of breath. This started just prior to arrival. Was so severe at the nursing home that they transfer to the hospital. Patient has a history of COPD and is on 5 L oxygen at all times. She denies history of blood clots denies recent lower extremity edema. Denies recent travel. Patient denies cough congestion denies fevers or chills. Limited history secondary to dementia.  Past Medical History  Diagnosis Date  . Osteoporosis 02/2011  . Seizures ([place])     Remotely  . DJD (degenerative joint disease)   . Diverticulosis 02/2011  . Hemorrhoids 02/2011  . Dizziness   . Low back pain   . Mitral valve problem thickened    thickened  . Unspecified hypothyroidism 01/2011  . 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 Vitamin D deficiency 01/2011  . Anemia, unspecified 03/18/2011  . Anxiety state, unspecified 01/2011  . 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 Pain in joint, site unspecified 01/2012  . Sacroiliitis, not elsewhere classified ([place]) 05/2011  . Dysuria 06/17/2011  . Other abnormal blood chemistry 0/30/2012  . Blood in stool 06/15/2012  . Other acquired deformity  of toe 12/16/2011  . Corns and callosities 07/01/2011   Past Surgical History  Procedure Laterality Date  . Cervical laminectomy  2005  . Bletheroplasty    . Hammer toe surgery  2013    right 2nd toe Dr. Mallie Mussel  . Joint replacement Bilateral 2002 Right, 1992 Left    knees  . Cataract extraction w/ intraocular lens  implant, bilateral  2010   Family History  Problem Relation Age of Onset  . Alzheimer's disease Sister   . Emphysema Sister    Social History  Substance Use Topics  . Smoking status: Former Smoker    Quit date: 09/14/1988  . Smokeless tobacco: Never Used     Comment: Smoked <1ppd  . Alcohol Use: 0.6 oz/week    1 Glasses of wine per week     Comment: doesn't drink routinely & only on social occasions   OB History    No data available     Review of Systems  Constitutional: Negative for fever and chills.  HENT: Negative for congestion and rhinorrhea.   Eyes: Negative for redness and visual disturbance.  Respiratory: Positive for shortness of breath. Negative for cough, sputum production and wheezing.   Cardiovascular: Negative for chest pain and palpitations.  Gastrointestinal: Negative for nausea and vomiting.  Genitourinary: Negative for dysuria and urgency.  Musculoskeletal: Negative for myalgias and arthralgias.  Skin: Negative for pallor and wound.  Neurological: Negative for dizziness and headaches.      Allergies  Morphine and related and Sulfa antibiotics  Home Medications  Prior to Admission medications   Medication Sig Start Date End Date Taking? Authorizing Provider  acetaminophen (TYLENOL) 650 MG CR tablet Take 650 mg by mouth 2 (two) times daily. x7 days. Started 03/23/15.   Yes Historical Provider, MD  antiseptic oral rinse (BIOTENE) LIQD 15 mLs by Mouth Rinse route 4 (four) times daily as needed for dry mouth.   Yes Historical Provider, MD  Calcium Carbonate-Vitamin D (CALCIUM 600+D) 600-400 MG-UNIT tablet Take 1 tablet by mouth daily.   Yes  Historical Provider, MD  Cholecalciferol (VITAMIN D-3) 5000 UNITS TABS Take 5,000 Units by mouth daily.    Yes Historical Provider, MD  clonazePAM (KLONOPIN) 0.5 MG tablet Take 1 tablet (0.5 mg total) by mouth 2 (two) times daily as needed (anxiety). 03/17/15  Yes Robbie Lis, MD  donepezil (ARICEPT) 10 MG tablet Take 10 mg by mouth daily.   Yes Historical Provider, MD  guaiFENesin (MUCINEX) 600 MG 12 hr tablet Take 600 mg by mouth 2 (two) times daily. For 30 days. Started 03/22/15.   Yes Historical Provider, MD  guaiFENesin-dextromethorphan (ROBITUSSIN DM) 100-10 MG/5ML syrup Take 5 mLs by mouth every 4 (four) hours as needed for cough. 03/17/15  Yes Robbie Lis, MD  lactose free nutrition (BOOST) LIQD Take 237 mLs by mouth 2 (two) times daily. With lunch and dinner   Yes Historical Provider, MD  levothyroxine (SYNTHROID, LEVOTHROID) 100 MCG tablet Take 1 tablet (100 mcg total) by mouth daily before breakfast. For thyroid 09/28/14  Yes Estill Dooms, MD  Multiple Vitamin (TAB-A-VITE PO) Take 1 tablet by mouth daily.   Yes Historical Provider, MD  omega-3 acid ethyl esters (LOVAZA) 1 G capsule Take 1 g by mouth daily. Fatty Acid one daily   Yes Historical Provider, MD  ondansetron (ZOFRAN) 4 MG tablet Take 1 tablet (4 mg total) by mouth every 6 (six) hours as needed for nausea. 01/12/15  Yes Venetia Maxon Rama, MD  OXYGEN Inhale 5 L into the lungs continuous.   Yes Historical Provider, MD  phenytoin (DILANTIN) 100 MG ER capsule Take 1 capsule by mouth in  the morning and 2 capsules  by mouth at bedtime to prevent seizure. Patient taking differently: Take 100-200 mg by mouth 2 (two) times daily. Take 100 mg in the morning and 200 mg by mouth at bedtime 09/26/14  Yes Estill Dooms, MD  saccharomyces boulardii (FLORASTOR) 250 MG capsule Take 250 mg by mouth 2 (two) times daily.   Yes Historical Provider, MD  sertraline (ZOLOFT) 50 MG tablet One daily to improve anxiety and depression Patient taking  differently: Take 25 mg by mouth daily.  01/30/15  Yes Estill Dooms, MD  traMADol (ULTRAM) 50 MG tablet Take 1 tablet (50 mg total) by mouth every 6 (six) hours as needed for moderate pain or severe pain. 03/17/15  Yes Robbie Lis, MD  levofloxacin (LEVAQUIN) 750 MG tablet Take 1 tablet (750 mg total) by mouth every other day. 03/19/15   Robbie Lis, MD  meloxicam (MOBIC) 15 MG tablet TAKE 1 TABLET BY MOUTH ONCE DAILY TO HELP ARTHRITIS Patient not taking: Reported on 03/28/2015 09/28/14   Estill Dooms, MD   BP 100/63 mmHg  Pulse 83  Temp(Src) 98 F (36.7 C) (Oral)  Resp 23  Ht '5\' 5"'$  (1.651 m)  Wt 128 lb (58.06 kg)  BMI 21.30 kg/m2  SpO2 100% Physical Exam  Constitutional: She is oriented to person, place, and time. She appears well-developed and  well-nourished. She appears toxic. No distress.  HENT:  Head: Normocephalic and atraumatic.  Eyes: EOM are normal. Pupils are equal, round, and reactive to light.  Neck: Normal range of motion. Neck supple.  Cardiovascular: Normal rate and regular rhythm.  Exam reveals no gallop and no friction rub.   No murmur heard. Pulmonary/Chest: Effort normal. Tachypnea noted. She has wheezes (faint, expiratory worst at bases). She has no rales.  Abdominal: Soft. She exhibits no distension. There is no tenderness. There is no rebound and no guarding.  Musculoskeletal: She exhibits no edema or tenderness.  Neurological: She is alert and oriented to person, place, and time.  Skin: Skin is warm and dry. She is not diaphoretic.  Psychiatric: She has a normal mood and affect. Her behavior is normal.  Nursing note and vitals reviewed.   ED Course  Procedures (including critical care time) Labs Review Labs Reviewed  BASIC METABOLIC PANEL - Abnormal; Notable for the following:    Glucose, Bld 141 (*)    All other components within normal limits  BRAIN NATRIURETIC PEPTIDE - Abnormal; Notable for the following:    B Natriuretic Peptide 495.8 (*)     All other components within normal limits  CBC WITH DIFFERENTIAL/PLATELET - Abnormal; Notable for the following:    WBC 12.3 (*)    Neutro Abs 8.3 (*)    Monocytes Absolute 1.2 (*)    Eosinophils Absolute 1.4 (*)    All other components within normal limits  TROPONIN I - Abnormal; Notable for the following:    Troponin I 0.54 (*)    All other components within normal limits  CBC - Abnormal; Notable for the following:    RBC 3.56 (*)    Hemoglobin 11.3 (*)    HCT 34.9 (*)    All other components within normal limits  I-STAT TROPOININ, ED - Abnormal; Notable for the following:    Troponin i, poc 0.40 (*)    All other components within normal limits  APTT  PROTIME-INR  HEPARIN LEVEL (UNFRACTIONATED)  TROPONIN I  TROPONIN I  APTT  PROTIME-INR  HEPARIN LEVEL (UNFRACTIONATED)  CBC  BASIC METABOLIC PANEL  MAGNESIUM  PHOSPHORUS  TYPE AND SCREEN    Imaging Review Dg Chest 2 View  03/28/2015  CLINICAL DATA:  Low oxygen.  Increased short of breath. EXAM: CHEST  2 VIEW COMPARISON:  CT 03/16/2015, chest radiograph 03/16/2015 FINDINGS: Normal cardiac silhouette. There is bilateral interstitial lung disease. There is slight increase in density in the upper lobes suggesting superimposed infection or edema. No pneumothorax. IMPRESSION: Interstitial lung disease with superimposed mild pulmonary edema versus pneumonia. Electronically Signed   By: Suzy Bouchard M.D.   On: 03/28/2015 12:31   Ct Angio Chest Pe W/cm &/or Wo Cm  03/28/2015  CLINICAL DATA:  Shortness of Breath EXAM: CT ANGIOGRAPHY CHEST WITH CONTRAST TECHNIQUE: Multidetector CT imaging of the chest was performed using the standard protocol during bolus administration of intravenous contrast. Multiplanar CT image reconstructions and MIPs were obtained to evaluate the vascular anatomy. CONTRAST:  11m OMNIPAQUE IOHEXOL 350 MG/ML SOLN COMPARISON:  03/16/2015 FINDINGS: The lungs are well expanded bilaterally. There again noted diffuse  chronic fibrotic changes bilaterally. The degree of chronic last infiltrate has improved in the interval. Multiple calcified granulomas are seen. The previously seen pleural effusions have also resolved in the interval. The thoracic inlet shows no acute abnormality. The thoracic aorta demonstrates a normal branching pattern. Some calcific changes are seen without aneurysmal dilatation or signs of  dissection. The coronary arteries demonstrate mild calcifications. The pulmonary artery is well visualized and demonstrates multiple filling defects throughout the lower lobes bilaterally consistent with pulmonary emboli. There is prominence of the right ventricle with evidence of right heart strain. The RV to LV ratio is 1.2. This is indicative of sub massive pulmonary embolism and right heart strain. No definitive filling defects are noted within the right ventricle. There are lymph node is again identified in the aorticopulmonary window as well as in the left suprahilar region stable from the prior exam. No new focal adenopathy is noted. The visualized upper abdomen is within normal limits. Degenerative changes of thoracic spine are again seen and stable. Review of the MIP images confirms the above findings. IMPRESSION: Positive for acute PE with CT evidence of right heart strain (RV/LV Ratio = 1.2) consistent with at least submassive (intermediate risk) PE. The presence of right heart strain has been associated with an increased risk of morbidity and mortality. Please activate Code PE by paging [phone]. Chronic fibrotic changes in the lungs bilaterally. Otherwise stable appearance from the prior study. These results were called by telephone at the time of interpretation on 03/28/2015 at 1:57 pm to Dr. Deno Etienne , who verbally acknowledged these results. Electronically Signed   By: Inez Catalina M.D.   On: 03/28/2015 13:58   I have personally reviewed and evaluated these images and lab results as part of my medical  decision-making.   EKG Interpretation   Date/Time:  Wednesday March 28 2015 11:09:50 EST Ventricular Rate:  72 PR Interval:  124 QRS Duration: 77 QT Interval:  419 QTC Calculation: 458 R Axis:   -89 Text Interpretation:  Sinus rhythm Inferior infarct, age indeterminate  Anterior infarct, age indeterminate new flipped t waves in inferior and  lateral leads not seen on prior Otherwise no significant change Confirmed  by Cardelia Sassano MD, DANIEL [phone]) on 03/28/2015 11:14:54 AM      MDM   Final diagnoses:  SOB (shortness of breath)  Other acute pulmonary embolism without acute cor pulmonale (HCC)  Troponin level elevated    81 yo F with a chief complaint of shortness of breath. EKG concerning for flipped T waves in inferior distribution. Initial troponin positive. Concern for NSTEMI. Start on heparin, asa, nitro. Cards consult.    Patient deteriorated while in the ED. Blood pressures down to the 61Y systolic. Given 500 mL bolus with some improvement. Patient also requiring more oxygen placed on a nonrebreather. CT PE scan ordered. Patient found to have large bilateral PEs with right heart strain. Discussed the case with Dr. Jasper Riling, with vitals and labs that he is able to review felt that TPA might be a possible option for this patient. Discussed risks and benefits with the family. They understand that there is a chance of catastrophic intracranial hemorrhage or other bleeding. Discussed other options of care. After discussing with her family want to go ahead with the treatment.  Discussed with ICU attending, recommend conferring with IR for other options if available.   ICU admit.   CRITICAL CARE Performed by: Cecilio Asper   Total critical care time: 75 minutes  Critical care time was exclusive of separately billable procedures and treating other patients.  Critical care was necessary to treat or prevent imminent or life-threatening deterioration.  Critical care was time  spent personally by me on the following activities: development of treatment plan with patient and/or surrogate as well as nursing, discussions with consultants, evaluation of patient's response  to treatment, examination of patient, obtaining history from patient or surrogate, ordering and performing treatments and interventions, ordering and review of laboratory studies, ordering and review of radiographic studies, pulse oximetry and re-evaluation of patient's condition.  The patients results and plan were reviewed and discussed.   Any x-rays performed were independently reviewed by myself.   Differential diagnosis were considered with the presenting HPI.  Medications  0.9 %  sodium chloride infusion (0 mLs Intravenous Hold 03/28/15 1612)  heparin ADULT infusion 100 units/mL (25000 units/250 mL) (650 Units/hr Intravenous New Bag/Given 03/28/15 1610)  0.9 %  sodium chloride infusion (not administered)  ipratropium-albuterol (DUONEB) 0.5-2.5 (3) MG/3ML nebulizer solution 3 mL (3 mLs Nebulization Given 03/28/15 1125)  predniSONE (DELTASONE) tablet 40 mg (40 mg Oral Given 03/28/15 1112)  nitroGLYCERIN (NITROSTAT) SL tablet 0.4 mg (0.4 mg Sublingual Given 03/28/15 1218)  aspirin chewable tablet 324 mg (324 mg Oral Given 03/28/15 1206)  heparin bolus via infusion 3,000 Units (3,000 Units Intravenous Given 03/28/15 1233)  sodium chloride 0.9 % bolus 500 mL (0 mLs Intravenous Stopped 03/28/15 1453)  iohexol (OMNIPAQUE) 350 MG/ML injection 100 mL (80 mLs Intravenous Contrast Given 03/28/15 1320)  furosemide (LASIX) injection 20 mg (20 mg Intravenous Given 03/28/15 1605)    Filed Vitals:   03/28/15 1733 03/28/15 1800 03/28/15 1830 03/28/15 1917  BP: 99/57 116/69 114/60 100/63  Pulse: 87 89 80 83  Temp:      TempSrc:      Resp: '26 24 25 23  '$ Height:      Weight:      SpO2: 99% 99% 97% 100%    Final diagnoses:  SOB (shortness of breath)  Other acute pulmonary embolism without acute cor pulmonale (HCC)   Troponin level elevated    Admission/ observation were discussed with the admitting physician, patient and/or family and they are comfortable with the plan.     Deno Etienne, DO 03/28/15 1943

## 2015-03-28 NOTE — ED Notes (Addendum)
Dr Tyrone Nine and RN informed about elevated troponin.

## 2015-03-29 ENCOUNTER — Encounter (HOSPITAL_COMMUNITY): Payer: Self-pay | Admitting: Radiology

## 2015-03-29 ENCOUNTER — Other Ambulatory Visit: Payer: Self-pay | Admitting: Interventional Radiology

## 2015-03-29 ENCOUNTER — Inpatient Hospital Stay (HOSPITAL_COMMUNITY): Payer: Medicare Other

## 2015-03-29 DIAGNOSIS — Z95828 Presence of other vascular implants and grafts: Secondary | ICD-10-CM

## 2015-03-29 LAB — COMPREHENSIVE METABOLIC PANEL
ALT: 25 U/L (ref 14–54)
ANION GAP: 6 (ref 5–15)
AST: 33 U/L (ref 15–41)
Albumin: 2.3 g/dL — ABNORMAL LOW (ref 3.5–5.0)
Alkaline Phosphatase: 75 U/L (ref 38–126)
BUN: 13 mg/dL (ref 6–20)
CHLORIDE: 105 mmol/L (ref 101–111)
CO2: 29 mmol/L (ref 22–32)
CREATININE: 0.61 mg/dL (ref 0.44–1.00)
Calcium: 8.6 mg/dL — ABNORMAL LOW (ref 8.9–10.3)
Glucose, Bld: 95 mg/dL (ref 65–99)
POTASSIUM: 3.9 mmol/L (ref 3.5–5.1)
Sodium: 140 mmol/L (ref 135–145)
Total Bilirubin: 0.1 mg/dL — ABNORMAL LOW (ref 0.3–1.2)
Total Protein: 6.2 g/dL — ABNORMAL LOW (ref 6.5–8.1)

## 2015-03-29 LAB — CBC
HCT: 34.1 % — ABNORMAL LOW (ref 36.0–46.0)
HEMOGLOBIN: 10.9 g/dL — AB (ref 12.0–15.0)
MCH: 31.2 pg (ref 26.0–34.0)
MCHC: 32 g/dL (ref 30.0–36.0)
MCV: 97.7 fL (ref 78.0–100.0)
Platelets: 203 10*3/uL (ref 150–400)
RBC: 3.49 MIL/uL — AB (ref 3.87–5.11)
RDW: 14.1 % (ref 11.5–15.5)
WBC: 9.4 10*3/uL (ref 4.0–10.5)

## 2015-03-29 LAB — BASIC METABOLIC PANEL
Anion gap: 9 (ref 5–15)
BUN: 15 mg/dL (ref 6–20)
CALCIUM: 8.3 mg/dL — AB (ref 8.9–10.3)
CHLORIDE: 104 mmol/L (ref 101–111)
CO2: 24 mmol/L (ref 22–32)
CREATININE: 0.64 mg/dL (ref 0.44–1.00)
GFR calc Af Amer: >60 mL/min (ref >60)
GFR calc non Af Amer: >60 mL/min (ref >60)
GLUCOSE: 159 mg/dL — AB (ref 65–99)
Potassium: 4.3 mmol/L (ref 3.5–5.1)
Sodium: 137 mmol/L (ref 135–145)

## 2015-03-29 LAB — PROTIME-INR
INR: 1.22 (ref 0.00–1.49)
Prothrombin Time: 15.6 seconds — ABNORMAL HIGH (ref 11.6–15.2)

## 2015-03-29 LAB — HEPARIN LEVEL (UNFRACTIONATED)
HEPARIN UNFRACTIONATED: 0.26 [IU]/mL — AB (ref 0.30–0.70)
HEPARIN UNFRACTIONATED: 0.37 [IU]/mL (ref 0.30–0.70)
Heparin Unfractionated: 0.28 IU/mL — ABNORMAL LOW (ref 0.30–0.70)

## 2015-03-29 LAB — TROPONIN I: TROPONIN I: 0.44 ng/mL — AB (ref <0.031)

## 2015-03-29 LAB — APTT: APTT: 98 s — AB (ref 24–37)

## 2015-03-29 LAB — MAGNESIUM: MAGNESIUM: 2 mg/dL (ref 1.7–2.4)

## 2015-03-29 LAB — PHOSPHORUS: PHOSPHORUS: 4.2 mg/dL (ref 2.5–4.6)

## 2015-03-29 LAB — PHENYTOIN LEVEL, TOTAL: PHENYTOIN LVL: 4.3 ug/mL — AB (ref 10.0–20.0)

## 2015-03-29 MED ORDER — LIDOCAINE HCL 1 % IJ SOLN
INTRAMUSCULAR | Status: AC
  Filled 2015-03-29: qty 20

## 2015-03-29 MED ORDER — MIDAZOLAM HCL 2 MG/2ML IJ SOLN
INTRAMUSCULAR | Status: AC | PRN
Start: 2015-03-29 — End: 2015-03-29
  Administered 2015-03-29: 1 mg via INTRAVENOUS

## 2015-03-29 MED ORDER — MIDAZOLAM HCL 2 MG/2ML IJ SOLN
INTRAMUSCULAR | Status: AC
  Filled 2015-03-29: qty 2

## 2015-03-29 MED ORDER — ACETAMINOPHEN 325 MG PO TABS
650.0000 mg | ORAL_TABLET | ORAL | Status: DC | PRN
  Administered 2015-03-29 – 2015-04-03 (×4): 650 mg via ORAL
  Filled 2015-03-29 (×4): qty 2

## 2015-03-29 MED ORDER — IOHEXOL 300 MG/ML  SOLN
150.0000 mL | Freq: Once | INTRAMUSCULAR | Status: DC | PRN
Start: 2015-03-29 — End: 2015-04-04
  Administered 2015-03-29: 50 mL via INTRAVENOUS
  Filled 2015-03-29: qty 150

## 2015-03-29 MED ORDER — FENTANYL CITRATE (PF) 100 MCG/2ML IJ SOLN
INTRAMUSCULAR | Status: AC
  Filled 2015-03-29: qty 2

## 2015-03-29 MED ORDER — CETYLPYRIDINIUM CHLORIDE 0.05 % MT LIQD
7.0000 mL | Freq: Two times a day (BID) | OROMUCOSAL | Status: DC
  Administered 2015-03-29 – 2015-03-30 (×5): 7 mL via OROMUCOSAL

## 2015-03-29 MED ORDER — FENTANYL CITRATE (PF) 100 MCG/2ML IJ SOLN
INTRAMUSCULAR | Status: AC | PRN
  Administered 2015-03-29: 50 ug via INTRAVENOUS

## 2015-03-29 NOTE — Progress Notes (Addendum)
ANTICOAGULATION CONSULT NOTE - Follow Up Consult  Pharmacy Consult for Heparin Indication: pulmonary embolus  Allergies  Allergen Reactions  . Morphine And Related Other (See Comments)    Altered mental state   . Sulfa Antibiotics Other (See Comments)    Reaction: unknown    Patient Measurements: Height: 5' (152.4 cm) Weight: 126 lb 12.2 oz (57.5 kg) IBW/kg (Calculated) : 45.5  Vital Signs: Temp: 97.8 F (36.6 C) (11/10 1533) Temp Source: Oral (11/10 1533) BP: 119/102 mmHg (11/10 1500) Pulse Rate: 94 (11/10 1500)  Labs:  Recent Labs  03/28/15 1137  03/28/15 1138 03/28/15 1452 03/28/15 1913 03/28/15 2120 03/28/15 2338 03/29/15 0232 03/29/15 0729 03/29/15 1045 03/29/15 1521  HGB  --   < > 12.7  --  11.3*  --   --  10.9*  --   --   --   HCT  --   --  38.8  --  34.9*  --   --  34.1*  --   --   --   PLT  --   --  255  --  251  --   --  203  --   --   --   APTT 36  --   --   --  73*  --   --  98*  --   --   --   LABPROT 14.6  --   --   --  15.4*  --   --  15.6*  --   --   --   INR 1.13  --   --   --  1.20  --   --  1.22  --   --   --   HEPARINUNFRC  --   --   --   --   --  <0.10*  --   --  0.26*  --  0.37  CREATININE  --   --  0.77  --   --   --   --  0.64  --  0.61  --   TROPONINI  --   --   --  0.54*  --   --  0.44*  --   --   --   --   < > = values in this interval not displayed.  Estimated Creatinine Clearance: 40.8 mL/min (by C-G formula based on Cr of 0.61).   Assessment: 81 year old female transferred from [facility] on heparin for PE. HL therapeutic (0.37) on increase to 1000 units/h this AM. Hg 10.9, plt wnl. Considering bridging anticoag tomorrow per CCM note No bleeding and infusion running well.  S/p IVC filter placement today - spoke with RN and heparin was not turned off at any time, no issues.  Goal of Therapy:  Heparin level 0.3-0.7 units/ml Monitor platelets by anticoagulation protocol: Yes   Plan:  Continue Heparin at 1000 units / hr 6 hour  heparin level due to PE to confirm Daily heparin level and CBC while on therapy Monitor for signs and symptoms of bleeding Follow-up plans for IVC filter placement  Elicia Lamp, PharmD Clinical Pharmacist Pager [phone] 03/29/2015 4:34 PM   ADDENDUM:  F/u 6h HL slightly subtherapeutic (0.28) on 1000 units/h. Increase to 1100 units/h. F/u 6h HL. No bleed/IV line issues per RN.   Elicia Lamp, PharmD Clinical Pharmacist Pager [phone] 03/29/2015 10:45 PM

## 2015-03-29 NOTE — Procedures (Signed)
Interventional Radiology Procedure Note  Procedure: Placement of a retrievable IVC filter.   Complications: None  Estimated Blood Loss: 0  Recommendations: - Bedrest x 2 hrs - F/U IR clinic for potential filter retrieval  Signed,  Criselda Peaches, MD

## 2015-03-29 NOTE — Progress Notes (Signed)
ANTICOAGULATION CONSULT NOTE - Follow Up Consult  Pharmacy Consult for Heparin Indication: pulmonary embolus  Allergies  Allergen Reactions  . Morphine And Related Other (See Comments)    Altered mental state   . Sulfa Antibiotics Other (See Comments)    Reaction: unknown    Patient Measurements: Height: 5' (152.4 cm) Weight: 126 lb 12.2 oz (57.5 kg) IBW/kg (Calculated) : 45.5  Vital Signs: Temp: 98.2 F (36.8 C) (11/10 0747) Temp Source: Oral (11/10 0747) BP: 102/54 mmHg (11/10 0600) Pulse Rate: 77 (11/10 0600)  Labs:  Recent Labs  03/28/15 1137  03/28/15 1138 03/28/15 1452 03/28/15 1913 03/28/15 2120 03/28/15 2338 03/29/15 0232 03/29/15 0729  HGB  --   < > 12.7  --  11.3*  --   --  10.9*  --   HCT  --   --  38.8  --  34.9*  --   --  34.1*  --   PLT  --   --  255  --  251  --   --  203  --   APTT 36  --   --   --  73*  --   --  98*  --   LABPROT 14.6  --   --   --  15.4*  --   --  15.6*  --   INR 1.13  --   --   --  1.20  --   --  1.22  --   HEPARINUNFRC  --   --   --   --   --  <0.10*  --   --  0.26*  CREATININE  --   --  0.77  --   --   --   --  0.64  --   TROPONINI  --   --   --  0.54*  --   --  0.44*  --   --   < > = values in this interval not displayed.  Estimated Creatinine Clearance: 40.8 mL/min (by C-G formula based on Cr of 0.64).   Assessment: 81 year old female transferred from [facility] on heparin for PE. Heparin level 0.26 after increase and bolus- still SUB-therapeutic for goal.  No bleeding and infusion running well.  Possible plan for IVC filter placement.   Goal of Therapy:  Heparin level 0.3-0.7 units/ml Monitor platelets by anticoagulation protocol: Yes   Plan:  Increase Heparin to 1000 units / hr 6 hour heparin level due to PE Daily heparin level and CBC while on therapy Monitor for signs and symptoms of bleeding Follow-up plans for IVC filter placement  Sloan Leiter, PharmD, BCPS Clinical Pharmacist [phone]  03/29/2015,9:12  AM

## 2015-03-29 NOTE — Progress Notes (Signed)
PULMONARY / CRITICAL CARE MEDICINE   Name: Rebecca [name] MRN: [id number] DOB: 02-12-1928    ADMISSION DATE:  03/28/2015 CONSULTATION DATE:  03/28/2015  REFERRING MD :  Dr. Deno Etienne [facility]  CHIEF COMPLAINT:  Pulmonary Emboli with Right Heart Strain  INITIAL PRESENTATION: 81 y.o. Female with discharge from hospital on 03/19/15 after being admitted on 03/11/15 with sepsis secondary to pneumonia. Patient reportedly was on Lovenox for DVT prophylaxis during that admission. Prior to that she was living in an assisted living and was discharged to a skilled nursing facility. She had worsening hypoxia & dyspnea at the nursing facility and was sent to the E.D. for further workup where she was found to be hypoxic and hypotensive  STUDIES:  CT Head 08/12/09 - Chronic microvascular ischemic changes. No evidence of infarction or mass lesion. CTA Chest 03/28/15 - Filling defects bilateral lobes c/w emboli. RV:LV ratio 1.2. No filling defects in RV. Venous Duplex 03/28/15 - Prelim findings c/w mobile DVT in left proximal femoral vein extending into common femoral. Occlusive DVT bilateral posterior tibial, peroneal, & soleal veins. TTE 03/28/15 - LV normal in size w/ mild LVH. EF 60-65%. No wall motion abnormalities. Grade 1 diastolic dysfunction. LA normal in size. RA mildly dilated. RV mildly dilated with preserved systolic function. PASP 36mHg. No aortic stenosis or regurg. No mitral stenosis or regurg. No pulmonic regurg. Mild tricuspid regurg. No pericardial effusion.  SIGNIFICANT EVENTS: 11/9 - [facility]  SUBJECTIVE:  Doing well. No chest pain or SOB. She just feels tired.   VITAL SIGNS: Temp:  [97.8 F (36.6 C)-98.2 F (36.8 C)] 98.2 F (36.8 C) (11/10 0747) Pulse Rate:  [77-94] 77 (11/10 0600) Resp:  [16-31] 21 (11/10 0600) BP: (80-116)/(43-69) 102/54 mmHg (11/10 0600) SpO2:  [79 %-100 %] 100 % (11/10 0600) FiO2 (%):  [40 %]  40 % (11/09 1126) Weight:  [126 lb 12.2 oz (57.5 kg)-128 lb (58.06 kg)] 126 lb 12.2 oz (57.5 kg) (11/10 0442) HEMODYNAMICS:   VENTILATOR SETTINGS: Vent Mode:  [-]  FiO2 (%):  [40 %] 40 % INTAKE / OUTPUT:  Intake/Output Summary (Last 24 hours) at 03/29/15 0828 Last data filed at 03/29/15 0600  Gross per 24 hour  Intake    264 ml  Output    200 ml  Net     64 ml    PHYSICAL EXAMINATION: General:  Sleeping until woken up. No acute distress.  Integument:  Warm & dry. No rash on exposed skin. No bruising. HEENT:  Dry mucus membranes.  Cardiovascular:  Regular rate, occasional ectopy. No edema. No appreciable JVD.  Pulmonary:  Mild bilateral basilar crackles.  No accessory muscle use on 5L [place].  Abdomen: Soft. Normal bowel sounds. Nondistended. Grossly nontender. Neurological:  Oriented to person, place, and month. No gross neurologic deficit.  LABS:  CBC  Recent Labs Lab 03/28/15 1138 03/28/15 1913 03/29/15 0232  WBC 12.3* 10.0 9.4  HGB 12.7 11.3* 10.9*  HCT 38.8 34.9* 34.1*  PLT 255 251 203   Coag's  Recent Labs Lab 03/28/15 1137 03/28/15 1913 03/29/15 0232  APTT 36 73* 98*  INR 1.13 1.20 1.22   BMET  Recent Labs Lab 03/23/15 03/28/15 1138 03/29/15 0232  NA 138 138 137  K 4.0 4.6 4.3  CL  --  103 104  CO2  --  27 24  BUN '14 19 15  '$ CREATININE 0.6 0.77 0.64  GLUCOSE  --  141* 159*   Electrolytes  Recent Labs Lab 03/28/15 1138 03/29/15 0232  CALCIUM 9.0 8.3*  MG  --  2.0  PHOS  --  4.2   Sepsis Markers No results for input(s): LATICACIDVEN, PROCALCITON, O2SATVEN in the last 168 hours. ABG No results for input(s): PHART, PCO2ART, PO2ART in the last 168 hours. Liver Enzymes  Recent Labs Lab 03/23/15  AST 26  ALT 19  ALKPHOS 83   Cardiac Enzymes  Recent Labs Lab 03/28/15 1452 03/28/15 2338  TROPONINI 0.54* 0.44*   Glucose  Recent Labs Lab 03/28/15 2036  GLUCAP 116*    Imaging Dg Chest 2 View  03/28/2015  CLINICAL DATA:  Low  oxygen.  Increased short of breath. EXAM: CHEST  2 VIEW COMPARISON:  CT 03/16/2015, chest radiograph 03/16/2015 FINDINGS: Normal cardiac silhouette. There is bilateral interstitial lung disease. There is slight increase in density in the upper lobes suggesting superimposed infection or edema. No pneumothorax. IMPRESSION: Interstitial lung disease with superimposed mild pulmonary edema versus pneumonia. Electronically Signed   By: Suzy Bouchard M.D.   On: 03/28/2015 12:31   Ct Angio Chest Pe W/cm &/or Wo Cm  03/28/2015  CLINICAL DATA:  Shortness of Breath EXAM: CT ANGIOGRAPHY CHEST WITH CONTRAST TECHNIQUE: Multidetector CT imaging of the chest was performed using the standard protocol during bolus administration of intravenous contrast. Multiplanar CT image reconstructions and MIPs were obtained to evaluate the vascular anatomy. CONTRAST:  4m OMNIPAQUE IOHEXOL 350 MG/ML SOLN COMPARISON:  03/16/2015 FINDINGS: The lungs are well expanded bilaterally. There again noted diffuse chronic fibrotic changes bilaterally. The degree of chronic last infiltrate has improved in the interval. Multiple calcified granulomas are seen. The previously seen pleural effusions have also resolved in the interval. The thoracic inlet shows no acute abnormality. The thoracic aorta demonstrates a normal branching pattern. Some calcific changes are seen without aneurysmal dilatation or signs of dissection. The coronary arteries demonstrate mild calcifications. The pulmonary artery is well visualized and demonstrates multiple filling defects throughout the lower lobes bilaterally consistent with pulmonary emboli. There is prominence of the right ventricle with evidence of right heart strain. The RV to LV ratio is 1.2. This is indicative of sub massive pulmonary embolism and right heart strain. No definitive filling defects are noted within the right ventricle. There are lymph node is again identified in the aorticopulmonary window as well  as in the left suprahilar region stable from the prior exam. No new focal adenopathy is noted. The visualized upper abdomen is within normal limits. Degenerative changes of thoracic spine are again seen and stable. Review of the MIP images confirms the above findings. IMPRESSION: Positive for acute PE with CT evidence of right heart strain (RV/LV Ratio = 1.2) consistent with at least submassive (intermediate risk) PE. The presence of right heart strain has been associated with an increased risk of morbidity and mortality. Please activate Code PE by paging [phone] Chronic fibrotic changes in the lungs bilaterally. Otherwise stable appearance from the prior study. These results were called by telephone at the time of interpretation on 03/28/2015 at 1:57 pm to Dr. DDeno Etienne, who verbally acknowledged these results. Electronically Signed   By: MInez CatalinaM.D.   On: 03/28/2015 13:58     ASSESSMENT / PLAN:  PULMONARY A: Acute Bilateral Pulmonary Emboli Acute Hypoxic Respiratory Failure - Secondary to acute pulmonary emboli.  P:   Heparin drip per pharmacy protocol Repeat CBC qAM.  After discussing with IR, EKOS does  not reach the patient's clots If major decline, consider 1/2 dose tPA Patient may benefit from an IVC filter.  O2 to maintain O2 sat >92%, stable on non-rebreather at this time.  CARDIOVASCULAR A:  Cor Pulmonale - Likely secondary to acute pulmonary emboli. Elevated troponins- improving  Hypotensive on admission: BPs stable in 100s/50s P:  Monitor on Telemetry Therapeutic anticoagulation with Heparin per pharmacy protocol NS @ 100cc/hr  RENAL A:   No acute issues.  P:   Monitor UOP No foley catheter Monitor renal function with BUN/Creatinine given IV contrast & Lasix > stable currently  GASTROINTESTINAL A:   No acute issues.  P:   NPO, could consider bedside swallow eval today.   HEMATOLOGIC A:   Systemic Anticoagulation for PE Acute Pulmonary Emboli -  Likely provoked. LLE DVT - Mobile clot.  P:  Monitor Hgb Holding off on hypercoagulable workup given inciting factor  INFECTIOUS A:   No acute issues. Recent treatment for pneumonia.  P:   Monitor for fever & leukocytosis.  ENDOCRINE A:   H/O Hypothryoidism    P:   Plan to restart Synthroid once taking PO  NEUROLOGIC A:   Chronic Cognitive/Memory Deficit - Probable dementia. Chronic Benzodiazepine Use H/O Seizure D/O - None in years. At a high risk for delirium  P:   Monitor for signs of Benzo withdrawal Home Aricept & Zoloft  Home Dilantin once taking PO Seizure precautions  FAMILY  - Updates: patient updated at bedside, son not present  - Inter-disciplinary family meet or Palliative Care meeting due by:  11/16   Archie Patten, MD [facility] Resident  03/29/2015, 8:28 AM

## 2015-03-29 NOTE — Progress Notes (Signed)
PCCM Interval Progress Note  Events:  Pt complains of headache.  Interventions:  Will order Tylenol PRN.   Montey Hora, [facility] Pager: [phone]  or [phone] 03/29/2015, 8:11 PM

## 2015-03-29 NOTE — Progress Notes (Signed)
INTERVAL PROGRESS NOTE   Patient had her IVC placed this afternoon. Doing well. No SOB or chest pain. Very talkative. Has her daughter and son at bedside, all questions answered.  Blood pressure 121/77, pulse 83, temperature 97.8 F (36.6 C), temperature source Oral, resp. rate 20, height 5' (1.524 m), weight 126 lb 12.2 oz (57.5 kg), SpO2 92 %.  Sitting up, talkative in NAD No increased WOB, lungs CTAB on 6L [place] Tachycardic in 100s, regular rhythm. No m/r/g. No edema    Full liquid diet with bedside swallow, then ADAT. Continue heparin, will consider bridge tomorrow.   Archie Patten, MD [facility] Resident  03/29/2015, 2:13 PM

## 2015-03-29 NOTE — H&P (Signed)
Chief Complaint: Patient was seen in consultation today for mobile DVT and pulmonary emboli with right heart strain  Chief Complaint  Patient presents with  . Shortness of Breath   at the request of PCCM  Referring Physician(s): PCCM Dr. Vaughan Browner  History of Present Illness: Rebecca [name] is a 81 y.o. female who presented to ED with shortness of breath and recent admission and discharge for sepsis with pneumonia. CTA done revealed bilateral PE with evidence of right heart strain and LE venous duplex with mobile DVT. IR consulted for possible EKOS and filter placement. The patient is on IV heparin without signs of active bleeding and denies any chest pain currently, she denies any worsening shortness of breath, leg swelling or pain.   Past Medical History  Diagnosis Date  . Osteoporosis 02/2011  . Seizures ([place])     Remotely  . DJD (degenerative joint disease)   . Diverticulosis 02/2011  . Hemorrhoids 02/2011  . Dizziness   . Low back pain   . Mitral valve problem thickened    thickened  . Unspecified hypothyroidism 01/2011  . 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 Vitamin D deficiency 01/2011  . Anemia, unspecified 03/18/2011  . Anxiety state, unspecified 01/2011  . 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 Pain in joint, site unspecified 01/2012  . Sacroiliitis, not elsewhere classified ([place]) 05/2011  . Dysuria 06/17/2011  . Other abnormal blood chemistry 0/30/2012  . Blood in stool 06/15/2012  . Other acquired deformity of toe 12/16/2011  . Corns and callosities 07/01/2011    Past Surgical History  Procedure Laterality Date  . Cervical laminectomy  2005  . Bletheroplasty    . Hammer toe surgery  2013    right 2nd toe Dr. Mallie Mussel  . Joint replacement Bilateral 2002 Right, 1992 Left    knees  . Cataract extraction w/ intraocular lens  implant, bilateral  2010    Allergies: Morphine and related and Sulfa  antibiotics  Medications: Prior to Admission medications   Medication Sig Start Date End Date Taking? Authorizing Provider  acetaminophen (TYLENOL) 650 MG CR tablet Take 650 mg by mouth 2 (two) times daily. x7 days. Started 03/23/15.   Yes Historical Provider, MD  antiseptic oral rinse (BIOTENE) LIQD 15 mLs by Mouth Rinse route 4 (four) times daily as needed for dry mouth.   Yes Historical Provider, MD  Calcium Carbonate-Vitamin D (CALCIUM 600+D) 600-400 MG-UNIT tablet Take 1 tablet by mouth daily.   Yes Historical Provider, MD  Cholecalciferol (VITAMIN D-3) 5000 UNITS TABS Take 5,000 Units by mouth daily.    Yes Historical Provider, MD  clonazePAM (KLONOPIN) 0.5 MG tablet Take 1 tablet (0.5 mg total) by mouth 2 (two) times daily as needed (anxiety). 03/17/15  Yes Robbie Lis, MD  donepezil (ARICEPT) 10 MG tablet Take 10 mg by mouth daily.   Yes Historical Provider, MD  guaiFENesin (MUCINEX) 600 MG 12 hr tablet Take 600 mg by mouth 2 (two) times daily. For 30 days. Started 03/22/15.   Yes Historical Provider, MD  guaiFENesin-dextromethorphan (ROBITUSSIN DM) 100-10 MG/5ML syrup Take 5 mLs by mouth every 4 (four) hours as needed for cough. 03/17/15  Yes Robbie Lis, MD  lactose free nutrition (BOOST) LIQD Take 237 mLs by mouth 2 (two) times daily. With lunch and dinner   Yes Historical Provider, MD  levothyroxine (SYNTHROID, LEVOTHROID) 100 MCG tablet Take 1 tablet (100 mcg total) by mouth  daily before breakfast. For thyroid 09/28/14  Yes Estill Dooms, MD  Multiple Vitamin (TAB-A-VITE PO) Take 1 tablet by mouth daily.   Yes Historical Provider, MD  omega-3 acid ethyl esters (LOVAZA) 1 G capsule Take 1 g by mouth daily. Fatty Acid one daily   Yes Historical Provider, MD  ondansetron (ZOFRAN) 4 MG tablet Take 1 tablet (4 mg total) by mouth every 6 (six) hours as needed for nausea. 01/12/15  Yes Venetia Maxon Rama, MD  OXYGEN Inhale 5 L into the lungs continuous.   Yes Historical Provider, MD   phenytoin (DILANTIN) 100 MG ER capsule Take 1 capsule by mouth in  the morning and 2 capsules  by mouth at bedtime to prevent seizure. Patient taking differently: Take 100-200 mg by mouth 2 (two) times daily. Take 100 mg in the morning and 200 mg by mouth at bedtime 09/26/14  Yes Estill Dooms, MD  saccharomyces boulardii (FLORASTOR) 250 MG capsule Take 250 mg by mouth 2 (two) times daily.   Yes Historical Provider, MD  sertraline (ZOLOFT) 50 MG tablet One daily to improve anxiety and depression Patient taking differently: Take 25 mg by mouth daily.  01/30/15  Yes Estill Dooms, MD  traMADol (ULTRAM) 50 MG tablet Take 1 tablet (50 mg total) by mouth every 6 (six) hours as needed for moderate pain or severe pain. 03/17/15  Yes Robbie Lis, MD  levofloxacin (LEVAQUIN) 750 MG tablet Take 1 tablet (750 mg total) by mouth every other day. 03/19/15   Robbie Lis, MD  meloxicam (MOBIC) 15 MG tablet TAKE 1 TABLET BY MOUTH ONCE DAILY TO HELP ARTHRITIS Patient not taking: Reported on 03/28/2015 09/28/14   Estill Dooms, MD     Family History  Problem Relation Age of Onset  . Alzheimer's disease Sister   . Emphysema Sister     Social History   Social History  . Marital Status: Widowed    Spouse Name: N/A  . Number of Children: N/A  . Years of Education: N/A   Social History Main Topics  . Smoking status: Former Smoker    Quit date: 09/14/1988  . Smokeless tobacco: Never Used     Comment: Smoked <1ppd  . Alcohol Use: 0.6 oz/week    1 Glasses of wine per week     Comment: doesn't drink routinely & only on social occasions  . Drug Use: No  . Sexual Activity: No   Other Topics Concern  . None   Social History Narrative   Lives at [facility] since 10/07/2010, moved to [place] 01/16/2015   Widowed   Exercise class 3 times a week     Never smoked   Alcholol wine social    POA, Living Will      [facility]:   Primary MPOA:  Christell Faith (Daughter) - [phone]    Secondary MPOA:  Terease Marcotte (Son) - [phone]    Review of Systems: A 12 point ROS discussed and pertinent positives are indicated in the HPI above.  All other systems are negative.  Review of Systems  Vital Signs: BP 102/54 mmHg  Pulse 77  Temp(Src) 98.2 F (36.8 C) (Oral)  Resp 21  Ht 5' (1.524 m)  Wt 126 lb 12.2 oz (57.5 kg)  BMI 24.76 kg/m2  SpO2 100%  Physical Exam  Constitutional: She is oriented to person, place, and time. No distress.  HENT:  Head: Normocephalic and atraumatic.  Cardiovascular: Normal rate and regular rhythm.  Exam reveals no gallop and no friction rub.   No murmur heard. Pulmonary/Chest: Effort normal and breath sounds normal. No respiratory distress. She has no wheezes. She has no rales.  Abdominal: Soft. Bowel sounds are normal. She exhibits no distension. There is no tenderness.  Musculoskeletal: She exhibits no edema.  Neurological: She is alert and oriented to person, place, and time.  Skin: Skin is warm and dry. She is not diaphoretic.    Mallampati Score:  MD Evaluation Airway: WNL Heart: WNL Abdomen: WNL Chest/ Lungs: WNL ASA  Classification: 3 Mallampati/Airway Score: Two  Imaging: Dg Chest 2 View  03/28/2015  CLINICAL DATA:  Low oxygen.  Increased short of breath. EXAM: CHEST  2 VIEW COMPARISON:  CT 03/16/2015, chest radiograph 03/16/2015 FINDINGS: Normal cardiac silhouette. There is bilateral interstitial lung disease. There is slight increase in density in the upper lobes suggesting superimposed infection or edema. No pneumothorax. IMPRESSION: Interstitial lung disease with superimposed mild pulmonary edema versus pneumonia. Electronically Signed   By: Suzy Bouchard M.D.   On: 03/28/2015 12:31   Dg Chest 2 View  03/11/2015  CLINICAL DATA:  Sepsis. Fever. Altered mental status. Urinary tract infection. EXAM: CHEST  2 VIEW COMPARISON:  Chest x-rays dated 07/25/2013 and 01/28/2011 and 01/22/2011 FINDINGS: Heart size and  pulmonary vascularity are normal. The patient has developed patchy bilateral peripheral densities which are similar to those present in September 2012. No effusions. No acute osseous abnormality. IMPRESSION: Patchy bilateral pulmonary infiltrates. Findings are consistent with pneumonia/pneumonitis. Electronically Signed   By: Lorriane Shire M.D.   On: 03/11/2015 18:54   Ct Chest Wo Contrast  03/16/2015  CLINICAL DATA:  81 year old female with shortness of Breath, fever, altered mental status. Worsening lung opacity on chest radiographs. Initial encounter. EXAM: CT CHEST WITHOUT CONTRAST TECHNIQUE: Multidetector CT imaging of the chest was performed following the standard protocol without IV contrast. COMPARISON:  Portable chest 0939 hours today, and earlier. FINDINGS: Cardiomegaly. Small to moderate layering bilateral pleural effusions. No pericardial effusion. Calcified atherosclerosis of the aorta and coronary arteries. Tortuous thoracic aorta and proximal great vessels. Major airways are patent aside from atelectatic changes. Widespread and confluent pulmonary ground-glass opacity in the lungs right greater than left, with associated interstitial thickening. Areas of sparing also demonstrate attenuated vasculature suggestive of air trapping. This may reflect underlying emphysema. No areas of consolidation. Several calcified probable granulomas in the right upper and mid lung. Furthermore, there is fine reticulonodular calcification at the lung bases which is evident on multiple prior chest x-rays, suggesting alveolar microlithiasis. Mild AP window and right peritracheal lymph node enlargement (up to 13 mm) appears reactive in nature. No axillary lymphadenopathy. Visualized noncontrast liver, spleen, pancreas, and bowel in the upper abdomen are within normal limits. There is thickening of the left adrenal gland probably due to adrenal hyperplasia. Grossly negative visualized left kidney. Diffuse spinal disc  and endplate degeneration, severe in the lower thoracic levels with extensive vacuum phenomena. No acute osseous abnormality identified. IMPRESSION: 1. Scattered right greater than left pulmonary ground-glass opacity and some septal thickening superimposed on suspected chronic lung disease with emphysema and/or a degree of pulmonary fibrosis. Favor atypical infection, especially viral. 2. Small to moderate layering bilateral pleural effusions. Reactive appearing mediastinal lymphadenopathy. 3. Cardiomegaly with no pericardial effusion. Electronically Signed   By: Genevie Ann M.D.   On: 03/16/2015 15:02   Ct Angio Chest Pe W/cm &/or Wo Cm  03/28/2015  CLINICAL DATA:  Shortness of Breath EXAM: CT ANGIOGRAPHY CHEST  WITH CONTRAST TECHNIQUE: Multidetector CT imaging of the chest was performed using the standard protocol during bolus administration of intravenous contrast. Multiplanar CT image reconstructions and MIPs were obtained to evaluate the vascular anatomy. CONTRAST:  52m OMNIPAQUE IOHEXOL 350 MG/ML SOLN COMPARISON:  03/16/2015 FINDINGS: The lungs are well expanded bilaterally. There again noted diffuse chronic fibrotic changes bilaterally. The degree of chronic last infiltrate has improved in the interval. Multiple calcified granulomas are seen. The previously seen pleural effusions have also resolved in the interval. The thoracic inlet shows no acute abnormality. The thoracic aorta demonstrates a normal branching pattern. Some calcific changes are seen without aneurysmal dilatation or signs of dissection. The coronary arteries demonstrate mild calcifications. The pulmonary artery is well visualized and demonstrates multiple filling defects throughout the lower lobes bilaterally consistent with pulmonary emboli. There is prominence of the right ventricle with evidence of right heart strain. The RV to LV ratio is 1.2. This is indicative of sub massive pulmonary embolism and right heart strain. No definitive  filling defects are noted within the right ventricle. There are lymph node is again identified in the aorticopulmonary window as well as in the left suprahilar region stable from the prior exam. No new focal adenopathy is noted. The visualized upper abdomen is within normal limits. Degenerative changes of thoracic spine are again seen and stable. Review of the MIP images confirms the above findings. IMPRESSION: Positive for acute PE with CT evidence of right heart strain (RV/LV Ratio = 1.2) consistent with at least submassive (intermediate risk) PE. The presence of right heart strain has been associated with an increased risk of morbidity and mortality. Please activate Code PE by paging [phone] Chronic fibrotic changes in the lungs bilaterally. Otherwise stable appearance from the prior study. These results were called by telephone at the time of interpretation on 03/28/2015 at 1:57 pm to Dr. DDeno Etienne, who verbally acknowledged these results. Electronically Signed   By: MInez CatalinaM.D.   On: 03/28/2015 13:58   Dg Chest Port 1 View  03/16/2015  CLINICAL DATA:  Sob; patient also having right elbow pain, especially when abducting right arm; no injury EXAM: PORTABLE CHEST - 1 VIEW COMPARISON:  03/11/2015 FINDINGS: Coarse interstitial opacities throughout both lungs right greater than left, increased since previous exam. Some increase in bibasilar airspace opacities right greater than left, and new right suprahilar airspace disease. No pneumothorax. Blunting of lateral costophrenic angles suggesting small effusions. Spondylitic changes in the thoracolumbar spine a lumbar dextroscoliosis. IMPRESSION: 1. Worsening asymmetric interstitial and airspace edema or infiltrates, right worse than left. Electronically Signed   By: DLucrezia EuropeM.D.   On: 03/16/2015 10:23   Dg Humerus Right  03/16/2015  CLINICAL DATA:  Sob; patient also having right elbow pain, especially when abducting right arm; no injury EXAM:  RIGHT HUMERUS - 2+ VIEW COMPARISON:  None. FINDINGS: There is no evidence of fracture or other focal bone lesions. Soft tissues are unremarkable. Small acromioclavicular spurs. IMPRESSION: Negative humerus. Electronically Signed   By: DLucrezia EuropeM.D.   On: 03/16/2015 10:24    Labs:  CBC:  Recent Labs  03/23/15 03/28/15 1138 03/28/15 1913 03/29/15 0232  WBC 11.5 12.3* 10.0 9.4  HGB 11.5* 12.7 11.3* 10.9*  HCT 34* 38.8 34.9* 34.1*  PLT 266 255 251 203    COAGS:  Recent Labs  03/11/15 2350 03/28/15 1137 03/28/15 1913 03/29/15 0232  INR 1.17 1.13 1.20 1.22  APTT 37 36 73* 98*    BMP:  Recent Labs  03/17/15 0541 03/18/15 0550 03/23/15 03/28/15 1138 03/29/15 0232  NA 139 139 138 138 137  K 3.8 3.9 4.0 4.6 4.3  CL 106 104  --  103 104  CO2 27 28  --  27 24  GLUCOSE 112* 125*  --  141* 159*  BUN '13 11 14 19 15  '$ CALCIUM 8.3* 8.6*  --  9.0 8.3*  CREATININE 0.57 0.64 0.6 0.77 0.64  GFRNONAA >60 >60  --  >60 >60  GFRAA >60 >60  --  >60 >60    LIVER FUNCTION TESTS:  Recent Labs  06/29/14 03/08/15 03/11/15 1838 03/23/15  BILITOT  --   --  0.6  --   AST '19 20 22 26  '$ ALT '10 13 15 19  '$ ALKPHOS 80 74 62 83  PROT  --   --  6.4*  --   ALBUMIN  --   --  3.3*  --     Assessment and Plan: Shortness of breath CTA with Acute bilateral submassive pulmonary emboli with CT evidence of right heart strain- On IV Heparin, not a candidate for EKOS secondary to distribution of clot and risk/benefit ratio.  Lower extremity venous duplex with mobile DVT in left proximal femoral vein extending into left common femoral vein, occlusive DVT bilateral posterior tibial, peroneal, and soleal veins Request for image guided retrievable Inferior vena cava filter placement  The patient has been NPO, labs and vitals have been reviewed. Risks and Benefits discussed with the patient and her daughter and son today including, but not limited to bleeding, infection, contrast induced renal failure,  filter fracture or migration which can lead to emergency surgery or even death, strut penetration with damage or irritation to adjacent structures and caval thrombosis. All questions were answered, patient and family are agreeable to proceed. Consent signed and in chart.    Thank you for this interesting consult.  I greatly enjoyed meeting Reeanna DANNI SHIMA and look forward to participating in their care.  A copy of this report was sent to the requesting provider on this date.  SignedHedy Jacob 03/29/2015, 10:44 AM   I spent a total of 20 Minutes in face to face in clinical consultation, greater than 50% of which was counseling/coordinating care for pulmonary embolism with right heart strain and mobile lower extremity DVT

## 2015-03-30 LAB — BASIC METABOLIC PANEL
Anion gap: 8 (ref 5–15)
BUN: 10 mg/dL (ref 6–20)
CHLORIDE: 104 mmol/L (ref 101–111)
CO2: 30 mmol/L (ref 22–32)
Calcium: 8.8 mg/dL — ABNORMAL LOW (ref 8.9–10.3)
Creatinine, Ser: 0.56 mg/dL (ref 0.44–1.00)
GLUCOSE: 83 mg/dL (ref 65–99)
Potassium: 3.9 mmol/L (ref 3.5–5.1)
SODIUM: 142 mmol/L (ref 135–145)

## 2015-03-30 LAB — CBC
HCT: 35 % — ABNORMAL LOW (ref 36.0–46.0)
HEMOGLOBIN: 11 g/dL — AB (ref 12.0–15.0)
MCH: 31.2 pg (ref 26.0–34.0)
MCHC: 31.4 g/dL (ref 30.0–36.0)
MCV: 99.2 fL (ref 78.0–100.0)
Platelets: 239 10*3/uL (ref 150–400)
RBC: 3.53 MIL/uL — ABNORMAL LOW (ref 3.87–5.11)
RDW: 14.1 % (ref 11.5–15.5)
WBC: 8.7 10*3/uL (ref 4.0–10.5)

## 2015-03-30 LAB — PROTIME-INR
INR: 1.22 (ref 0.00–1.49)
PROTHROMBIN TIME: 15.6 s — AB (ref 11.6–15.2)

## 2015-03-30 LAB — HEPARIN LEVEL (UNFRACTIONATED)
Heparin Unfractionated: 0.31 IU/mL (ref 0.30–0.70)
Heparin Unfractionated: 0.37 IU/mL (ref 0.30–0.70)

## 2015-03-30 MED ORDER — WARFARIN - PHARMACIST DOSING INPATIENT: Freq: Every day | Status: DC

## 2015-03-30 MED ORDER — HALOPERIDOL LACTATE 5 MG/ML IJ SOLN
INTRAMUSCULAR | Status: AC
  Filled 2015-03-30: qty 1

## 2015-03-30 MED ORDER — HALOPERIDOL LACTATE 5 MG/ML IJ SOLN
1.0000 mg | Freq: Four times a day (QID) | INTRAMUSCULAR | Status: DC | PRN
  Administered 2015-03-30 – 2015-04-03 (×6): 1 mg via INTRAVENOUS
  Filled 2015-03-30 (×5): qty 1

## 2015-03-30 MED ORDER — WARFARIN SODIUM 2.5 MG PO TABS
2.5000 mg | ORAL_TABLET | Freq: Once | ORAL | Status: AC
  Administered 2015-03-30: 2.5 mg via ORAL
  Filled 2015-03-30: qty 1

## 2015-03-30 MED ORDER — LEVOTHYROXINE SODIUM 100 MCG PO TABS
100.0000 ug | ORAL_TABLET | Freq: Every day | ORAL | Status: DC
Start: 2015-03-30 — End: 2015-04-04
  Administered 2015-03-30 – 2015-04-04 (×6): 100 ug via ORAL
  Filled 2015-03-30 (×7): qty 1

## 2015-03-30 NOTE — Progress Notes (Signed)
ANTICOAGULATION CONSULT NOTE - Follow Up Consult  Pharmacy Consult for Heparin  Indication: pulmonary embolus, s/p IVC filter placement on 11/10  Allergies  Allergen Reactions  . Morphine And Related Other (See Comments)    Altered mental state   . Sulfa Antibiotics Other (See Comments)    Reaction: unknown    Patient Measurements: Height: 5' (152.4 cm) Weight: 121 lb 11.1 oz (55.2 kg) IBW/kg (Calculated) : 45.5  Vital Signs: Temp: 98.1 F (36.7 C) (11/10 2325) Temp Source: Oral (11/10 2325) BP: 117/67 mmHg (11/11 0600) Pulse Rate: 66 (11/11 0600)  Labs:  Recent Labs  03/28/15 1137  03/28/15 1138 03/28/15 1452 03/28/15 1913  03/28/15 2338 03/29/15 0232  03/29/15 1045 03/29/15 1521 03/29/15 2138 03/30/15 0505  HGB  --   < > 12.7  --  11.3*  --   --  10.9*  --   --   --   --  11.0*  HCT  --   < > 38.8  --  34.9*  --   --  34.1*  --   --   --   --  35.0*  PLT  --   < > 255  --  251  --   --  203  --   --   --   --  239  APTT 36  --   --   --  73*  --   --  98*  --   --   --   --   --   LABPROT 14.6  --   --   --  15.4*  --   --  15.6*  --   --   --   --   --   INR 1.13  --   --   --  1.20  --   --  1.22  --   --   --   --   --   HEPARINUNFRC  --   --   --   --   --   < >  --   --   < >  --  0.37 0.28* 0.31  CREATININE  --   --  0.77  --   --   --   --  0.64  --  0.61  --   --   --   TROPONINI  --   --   --  0.54*  --   --  0.44*  --   --   --   --   --   --   < > = values in this interval not displayed.  Estimated Creatinine Clearance: 40.1 mL/min (by C-G formula based on Cr of 0.61).   Assessment: Therapeutic heparin level after rate increase, pt now has IVC filter in place  Goal of Therapy:  Heparin level 0.3-0.7 units/ml Monitor platelets by anticoagulation protocol: Yes   Plan:  -Continue heparin at 1100 units/hr -1400 HL  Barre Aydelott 03/30/2015,7:17 AM

## 2015-03-30 NOTE — Progress Notes (Signed)
PULMONARY / CRITICAL CARE MEDICINE   Name: Rebecca [name] MRN: [id number] DOB: 17-Jun-1927    ADMISSION DATE:  03/28/2015 CONSULTATION DATE:  03/28/2015  REFERRING MD :  Dr. Deno Etienne [facility]  CHIEF COMPLAINT:  Pulmonary Emboli with Right Heart Strain  INITIAL PRESENTATION: 81 y.o. Female with discharge from hospital on 03/19/15 after being admitted on 03/11/15 with sepsis secondary to pneumonia. Patient reportedly was on Lovenox for DVT prophylaxis during that admission. Prior to that she was living in an assisted living and was discharged to a skilled nursing facility. She had worsening hypoxia & dyspnea at the nursing facility and was sent to the E.D. for further workup where she was found to be hypoxic and hypotensive  STUDIES:  CT Head 08/12/09 - Chronic microvascular ischemic changes. No evidence of infarction or mass lesion. CTA Chest 03/28/15 - Filling defects bilateral lobes c/w emboli. RV:LV ratio 1.2. No filling defects in RV. Venous Duplex 03/28/15 - Prelim findings c/w mobile DVT in left proximal femoral vein extending into common femoral. Occlusive DVT bilateral posterior tibial, peroneal, & soleal veins. TTE 03/28/15 - LV normal in size w/ mild LVH. EF 60-65%. No wall motion abnormalities. Grade 1 diastolic dysfunction. LA normal in size. RA mildly dilated. RV mildly dilated with preserved systolic function. PASP 33mHg. No aortic stenosis or regurg. No mitral stenosis or regurg. No pulmonic regurg. Mild tricuspid regurg. No pericardial effusion.  SIGNIFICANT EVENTS: 11/9 - [facility] 11/10- placement of retrievable infrarenal IVC filter by IR.   SUBJECTIVE:  Required a non-rebreather overnight. Concerns from RN about sundowning given her agitation.      VITAL SIGNS: Temp:  [97.8 F (36.6 C)-98.2 F (36.8 C)] 98.1 F (36.7 C) (11/10 2325) Pulse Rate:  [66-94] 77 (11/11 0100) Resp:  [14-28] 14 (11/11  0100) BP: (90-138)/(52-109) 108/59 mmHg (11/11 0000) SpO2:  [57 %-100 %] 100 % (11/11 0100) FiO2 (%):  [80 %] 80 % (11/11 0000) Weight:  [126 lb 12.2 oz (57.5 kg)] 126 lb 12.2 oz (57.5 kg) (11/10 0442) HEMODYNAMICS:   VENTILATOR SETTINGS: Vent Mode:  [-]  FiO2 (%):  [80 %] 80 % INTAKE / OUTPUT:  Intake/Output Summary (Last 24 hours) at 03/30/15 0204 Last data filed at 03/30/15 0100  Gross per 24 hour  Intake   1268 ml  Output    200 ml  Net   1068 ml    PHYSICAL EXAMINATION: General:  Sleeping soundly in NAD.  Integument:  Warm & dry. No rash on exposed skin. No bruising. HEENT:  Dry mucus membranes.  Cardiovascular:  RRR.  No edema. No appreciable JVD.  Pulmonary:  Mild bilateral basilar crackles.  No accessory muscle use on 6L [place] with non-rebreather around chin.   Abdomen: Soft. Normal bowel sounds. Nondistended. Grossly nontender. Neurological:  Sleeping.   LABS:  CBC  Recent Labs Lab 03/28/15 1138 03/28/15 1913 03/29/15 0232  WBC 12.3* 10.0 9.4  HGB 12.7 11.3* 10.9*  HCT 38.8 34.9* 34.1*  PLT 255 251 203   Coag's  Recent Labs Lab 03/28/15 1137 03/28/15 1913 03/29/15 0232  APTT 36 73* 98*  INR 1.13 1.20 1.22   BMET  Recent Labs Lab 03/28/15 1138 03/29/15 0232 03/29/15 1045  NA 138 137 140  K 4.6 4.3 3.9  CL 103 104 105  CO2 '27 24 29  '$ BUN '19 15 13  '$ CREATININE 0.77 0.64 0.61  GLUCOSE 141* 159*  95   Electrolytes  Recent Labs Lab 03/28/15 1138 03/29/15 0232 03/29/15 1045  CALCIUM 9.0 8.3* 8.6*  MG  --  2.0  --   PHOS  --  4.2  --    Sepsis Markers No results for input(s): LATICACIDVEN, PROCALCITON, O2SATVEN in the last 168 hours. ABG No results for input(s): PHART, PCO2ART, PO2ART in the last 168 hours. Liver Enzymes  Recent Labs Lab 03/29/15 1045  AST 33  ALT 25  ALKPHOS 75  BILITOT 0.1*  ALBUMIN 2.3*   Cardiac Enzymes  Recent Labs Lab 03/28/15 1452 03/28/15 2338  TROPONINI 0.54* 0.44*   Glucose  Recent Labs Lab  03/28/15 2036  GLUCAP 116*    Imaging Ir Ivc Filter Plmt / S&i /img Guid/mod Sed  03/29/2015  INDICATION: 81 year old female with acute sub massive pulmonary embolus. Lower extremity duplex ultrasound demonstrates mobile DVT in the left proximal femoral vein extending into the common femoral vein with additional occlusive thrombus in the bilateral calf veins. Given existing presence of right heart strain and freely mobile thrombus, temporary caval interruption is warranted for dual prophylaxis. The patient is currently heparinized. EXAM: ULTRASOUND GUIDANCE FOR VASCULARACCESS IVC CATHETERIZATION AND VENOGRAM IVC FILTER INSERTION COMPARISON:  CTA chest 03/28/2015; duplex venous ultrasound 03/28/2015 MEDICATIONS: Fentanyl 50 mcg IV; Versed 1 mg IV ANESTHESIA/SEDATION: Sedation Time 15 minutes CONTRAST:  30m OMNIPAQUE IOHEXOL 300 MG/ML  SOLN FLUOROSCOPY TIME:  None COMPLICATIONS: None immediate PROCEDURE: Informed consent was obtained from the patient following explanation of the procedure, risks, benefits and alternatives. The patient understands, agrees and consents for the procedure. All questions were addressed. A time out was performed prior to the initiation of the procedure. Maximal barrier sterile technique utilized including caps, mask, sterile gowns, sterile gloves, large sterile drape, hand hygiene, and Betadine prep. Under sterile condition and local anesthesia, right internal jugular venous access was performed with ultrasound. An ultrasound image was saved and sent to PACS. Over a guidewire, the IVC filter delivery sheath and inner dilator were advanced into the IVC just above the IVC bifurcation. Contrast injection was performed for an IVC venogram. Through the delivery sheath, a retrievable Denali IVC filter was deployed below the level of the renal veins and above the IVC bifurcation. Limited post deployment venacavagram was performed. The delivery sheath was removed and hemostasis was  obtained with manual compression. A dressing was placed. The patient tolerated the procedure well without immediate post procedural complication. FINDINGS: The IVC is patent. No evidence of thrombus, stenosis, or occlusion. No variant venous anatomy. Successful placement of the IVC filter below the level of the renal veins. IMPRESSION: Successful ultrasound and fluoroscopically guided placement of an infrarenal retrievable IVC filter via right jugular approach. This IVC filter is potentially retrievable. The patient will be assessed for filter retrieval by Interventional Radiology in approximately 8-12 weeks. Further recommendations regarding filter retrieval, continued surveillance or declaration of device permanence, will be made at that time. Signed, HCriselda Peaches MD [facility] Electronically Signed   By: HJacqulynn CadetM.D.   On: 03/29/2015 14:52     ASSESSMENT / PLAN:  PULMONARY A: Acute Bilateral Pulmonary Emboli Acute Hypoxic Respiratory Failure - Secondary to acute pulmonary emboli. S/p IVC filter on 11/10 P:   Heparin drip per pharmacy protocol Consider transition to DHillerRepeat CBC qAM.  O2 to maintain O2 sat >92%.   CARDIOVASCULAR A:  Cor Pulmonale - Likely secondary to acute pulmonary emboli. Elevated troponins- improving  Hypotensive on  admission: BPs stable in 100s/50s P:  Monitor on Telemetry Therapeutic anticoagulation with Heparin per pharmacy protocol NS @ 100cc/hr; once taking good PO consider KVO once good PO intake.  RENAL A:   No acute issues.  P:   Monitor UOP No foley catheter  GASTROINTESTINAL A:   No acute issues.  P:   Full liquid diet, ADAT   HEMATOLOGIC A:   Systemic Anticoagulation for PE Acute Pulmonary Emboli - Likely provoked. LLE DVT - Mobile clot.  P:  Monitor Hgb Holding off on hypercoagulable workup given inciting factor  INFECTIOUS A:   No acute  issues. Recent treatment for pneumonia.  P:   Monitor for fever & leukocytosis.  ENDOCRINE A:   H/O Hypothryoidism    P:   Restart Synthroid   NEUROLOGIC A:   Chronic Cognitive/Memory Deficit - Probable dementia. Chronic Benzodiazepine Use H/O Seizure D/O - None in years. At a high risk for delirium  P:   Monitor for signs of Benzo withdrawal Home Aricept & Zoloft  Home Dilantin (not at goal level currently) Seizure precautions  FAMILY  - Updates: last updated 11/10  - Inter-disciplinary family meet or Palliative Care meeting due by:  11/16   Archie Patten, MD [facility] Resident  03/30/2015, 2:04 AM

## 2015-03-30 NOTE — Progress Notes (Signed)
MEDICATION RELATED CONSULT NOTE - Follow up   Pharmacy Consult for phenytoin Indication: History of seizure disorder - PTA phenytoin  Allergies  Allergen Reactions  . Morphine And Related Other (See Comments)    Altered mental state   . Sulfa Antibiotics Other (See Comments)    Reaction: unknown    Patient Measurements: Height: 5' (152.4 cm) Weight: 121 lb 11.1 oz (55.2 kg) IBW/kg (Calculated) : 45.5  Vital Signs: Temp: 97.4 F (36.3 C) (11/11 1156) Temp Source: Oral (11/11 1156) BP: 113/63 mmHg (11/11 1000) Pulse Rate: 79 (11/11 1100) Intake/Output from previous day: 11/10 0701 - 11/11 0700 In: 2130 [P.O.:900; I.V.:464] Out: 1000 [Urine:1000] Intake/Output from this shift: Total I/O In: 105 [I.V.:105] Out: 300 [Urine:300]  Labs:  Recent Labs  03/28/15 1137  03/28/15 1913 03/29/15 0232 03/29/15 1045 03/30/15 0505  WBC  --   < > 10.0 9.4  --  8.7  HGB  --   < > 11.3* 10.9*  --  11.0*  HCT  --   < > 34.9* 34.1*  --  35.0*  PLT  --   < > 251 203  --  239  APTT 36  --  73* 98*  --   --   CREATININE  --   < >  --  0.64 0.61 0.56  MG  --   --   --  2.0  --   --   PHOS  --   --   --  4.2  --   --   ALBUMIN  --   --   --   --  2.3*  --   PROT  --   --   --   --  6.2*  --   AST  --   --   --   --  33  --   ALT  --   --   --   --  25  --   ALKPHOS  --   --   --   --  75  --   BILITOT  --   --   --   --  0.1*  --   < > = values in this interval not displayed. Estimated Creatinine Clearance: 40.1 mL/min (by C-G formula based on Cr of 0.56).   Microbiology: Recent Results (from the past 720 hour(s))  Blood Culture (routine x 2)     Status: None   Collection Time: 03/11/15  6:30 PM  Result Value Ref Range Status   Specimen Description RIGHT ANTECUBITAL  Final   Special Requests BOTTLES DRAWN AEROBIC AND ANAEROBIC 5CC  Final   Culture   Final    NO GROWTH 5 DAYS Performed at [facility]    Report Status 03/17/2015 FINAL  Final  Blood Culture (routine x  2)     Status: None   Collection Time: 03/11/15  6:35 PM  Result Value Ref Range Status   Specimen Description BLOOD RIGHT FOREARM  Final   Special Requests BOTTLES DRAWN AEROBIC AND ANAEROBIC 5CC  Final   Culture   Final    NO GROWTH 5 DAYS Performed at [facility]    Report Status 03/17/2015 FINAL  Final  Urine culture     Status: None   Collection Time: 03/11/15  7:26 PM  Result Value Ref Range Status   Specimen Description URINE, CATHETERIZED  Final   Special Requests NONE  Final   Culture   Final    NO  GROWTH 1 DAY Performed at [facility]    Report Status 03/13/2015 FINAL  Final  MRSA PCR Screening     Status: None   Collection Time: 03/12/15  1:08 AM  Result Value Ref Range Status   MRSA by PCR NEGATIVE NEGATIVE Final    Comment:        The GeneXpert MRSA Assay (FDA approved for NASAL specimens only), is one component of a comprehensive MRSA colonization surveillance program. It is not intended to diagnose MRSA infection nor to guide or monitor treatment for MRSA infections.   MRSA PCR Screening     Status: None   Collection Time: 03/28/15  8:57 PM  Result Value Ref Range Status   MRSA by PCR NEGATIVE NEGATIVE Final    Comment:        The GeneXpert MRSA Assay (FDA approved for NASAL specimens only), is one component of a comprehensive MRSA colonization surveillance program. It is not intended to diagnose MRSA infection nor to guide or monitor treatment for MRSA infections.     Medical History: Past Medical History  Diagnosis Date  . Osteoporosis 02/2011  . Seizures ([place])     Remotely  . DJD (degenerative joint disease)   . Diverticulosis 02/2011  . Hemorrhoids 02/2011  . Dizziness   . Low back pain   . Mitral valve problem thickened    thickened  . Unspecified hypothyroidism 01/2011  . 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 Vitamin D deficiency 01/2011  . Anemia, unspecified 03/18/2011  . Anxiety  state, unspecified 01/2011  . 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 Pain in joint, site unspecified 01/2012  . Sacroiliitis, not elsewhere classified ([place]) 05/2011  . Dysuria 06/17/2011  . Other abnormal blood chemistry 0/30/2012  . Blood in stool 06/15/2012  . Other acquired deformity of toe 12/16/2011  . Corns and callosities 07/01/2011    Medications:  Prescriptions prior to admission  Medication Sig Dispense Refill Last Dose  . acetaminophen (TYLENOL) 650 MG CR tablet Take 650 mg by mouth 2 (two) times daily. x7 days. Started 03/23/15.   03/28/2015 at Unknown time  . antiseptic oral rinse (BIOTENE) LIQD 15 mLs by Mouth Rinse route 4 (four) times daily as needed for dry mouth.   unknown  . Calcium Carbonate-Vitamin D (CALCIUM 600+D) 600-400 MG-UNIT tablet Take 1 tablet by mouth daily.   03/27/2015 at Unknown time  . Cholecalciferol (VITAMIN D-3) 5000 UNITS TABS Take 5,000 Units by mouth daily.    03/28/2015 at Unknown time  . clonazePAM (KLONOPIN) 0.5 MG tablet Take 1 tablet (0.5 mg total) by mouth 2 (two) times daily as needed (anxiety). 10 tablet 0 03/28/2015 at Unknown time  . donepezil (ARICEPT) 10 MG tablet Take 10 mg by mouth daily.   03/28/2015 at Unknown time  . guaiFENesin (MUCINEX) 600 MG 12 hr tablet Take 600 mg by mouth 2 (two) times daily. For 30 days. Started 03/22/15.   03/28/2015 at Unknown time  . guaiFENesin-dextromethorphan (ROBITUSSIN DM) 100-10 MG/5ML syrup Take 5 mLs by mouth every 4 (four) hours as needed for cough. 118 mL 0 unknown  . lactose free nutrition (BOOST) LIQD Take 237 mLs by mouth 2 (two) times daily. With lunch and dinner   03/27/2015 at Unknown time  . levothyroxine (SYNTHROID, LEVOTHROID) 100 MCG tablet Take 1 tablet (100 mcg total) by mouth daily before breakfast. For thyroid 90 tablet 3 03/28/2015 at Unknown time  . Multiple Vitamin (  TAB-A-VITE PO) Take 1 tablet by mouth daily.   03/28/2015 at Unknown time  . omega-3 acid ethyl esters  (LOVAZA) 1 G capsule Take 1 g by mouth daily. Fatty Acid one daily   03/28/2015 at Unknown time  . ondansetron (ZOFRAN) 4 MG tablet Take 1 tablet (4 mg total) by mouth every 6 (six) hours as needed for nausea. 20 tablet 0 unknown  . OXYGEN Inhale 5 L into the lungs continuous.   03/28/2015 at Unknown time  . phenytoin (DILANTIN) 100 MG ER capsule Take 1 capsule by mouth in  the morning and 2 capsules  by mouth at bedtime to prevent seizure. (Patient taking differently: Take 100-200 mg by mouth 2 (two) times daily. Take 100 mg in the morning and 200 mg by mouth at bedtime) 270 capsule 3 03/28/2015 at Unknown time  . saccharomyces boulardii (FLORASTOR) 250 MG capsule Take 250 mg by mouth 2 (two) times daily.   03/28/2015 at Unknown time  . sertraline (ZOLOFT) 50 MG tablet One daily to improve anxiety and depression (Patient taking differently: Take 25 mg by mouth daily. ) 30 tablet 5 03/28/2015 at Unknown time  . traMADol (ULTRAM) 50 MG tablet Take 1 tablet (50 mg total) by mouth every 6 (six) hours as needed for moderate pain or severe pain. 30 tablet 0 unknown  . levofloxacin (LEVAQUIN) 750 MG tablet Take 1 tablet (750 mg total) by mouth every other day. 4 tablet 0   . meloxicam (MOBIC) 15 MG tablet TAKE 1 TABLET BY MOUTH ONCE DAILY TO HELP ARTHRITIS (Patient not taking: Reported on 03/28/2015) 90 tablet 4 03/11/2015 at 0800    Assessment: Ms. Rebecca [name] is an 58 yoF admitted 03/28/2015 with worsening hypoxia, dyspnea, and hypotension.Pt has a history of seizure disorder on phenytoin prior to admission. Patient has not had a seizure since 2002. At home she takes Phenytoin 100 mg qAM and 200 mg qPM and seems to have been continued on this dose for >1 year per nursing home notes.   11/10 Total phenytoin level 4.3 >> Corrected phenytoin low @ 7.68 - unsure cause of low level. Previously charted as having a level of 12 in nursing home note from September 2016. Possible interactions with nutrition status and/or  medication administration (vitamins/supplements binding, recent medication changes).   - Since patient has been stable with no seizure activity, continue home dose of phenytoin 100 mg qAM and 200 mg qPM despite low level   Plan:  - Continue home dose of phenytoin 100 mg qAM and 200 mg qPM - Monitor for seizure activity and DDIs  Joya San, PharmD Clinical Pharmacist Resident Pager # [phone] 03/30/2015 2:30 PM

## 2015-03-30 NOTE — Progress Notes (Addendum)
ANTICOAGULATION CONSULT NOTE - Initial Consult  Pharmacy Consult for bridge heparin to warfarin Indication: pulmonary embolus  Allergies  Allergen Reactions  . Morphine And Related Other (See Comments)    Altered mental state   . Sulfa Antibiotics Other (See Comments)    Reaction: unknown    Patient Measurements: Height: 5' (152.4 cm) Weight: 121 lb 11.1 oz (55.2 kg) IBW/kg (Calculated) : 45.5 Heparin Dosing Weight: 58.1 kg  Vital Signs: Temp: 98.4 F (36.9 C) (11/11 0822) Temp Source: Oral (11/11 0822) BP: 118/67 mmHg (11/11 0800) Pulse Rate: 74 (11/11 0800)  Labs:  Recent Labs  03/28/15 1137  03/28/15 1452 03/28/15 1913  03/28/15 2338 03/29/15 0232  03/29/15 1045 03/29/15 1521 03/29/15 2138 03/30/15 0505  HGB  --   < >  --  11.3*  --   --  10.9*  --   --   --   --  11.0*  HCT  --   < >  --  34.9*  --   --  34.1*  --   --   --   --  35.0*  PLT  --   < >  --  251  --   --  203  --   --   --   --  239  APTT 36  --   --  73*  --   --  98*  --   --   --   --   --   LABPROT 14.6  --   --  15.4*  --   --  15.6*  --   --   --   --   --   INR 1.13  --   --  1.20  --   --  1.22  --   --   --   --   --   HEPARINUNFRC  --   --   --   --   < >  --   --   < >  --  0.37 0.28* 0.31  CREATININE  --   < >  --   --   --   --  0.64  --  0.61  --   --  0.56  TROPONINI  --   --  0.54*  --   --  0.44*  --   --   --   --   --   --   < > = values in this interval not displayed.  Estimated Creatinine Clearance: 40.1 mL/min (by C-G formula based on Cr of 0.56).   Medical History: Past Medical History  Diagnosis Date  . Osteoporosis 02/2011  . Seizures ([place])     Remotely  . DJD (degenerative joint disease)   . Diverticulosis 02/2011  . Hemorrhoids 02/2011  . Dizziness   . Low back pain   . Mitral valve problem thickened    thickened  . Unspecified hypothyroidism 01/2011  . 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 Vitamin D deficiency 01/2011  .  Anemia, unspecified 03/18/2011  . Anxiety state, unspecified 01/2011  . 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 Pain in joint, site unspecified 01/2012  . Sacroiliitis, not elsewhere classified ([place]) 05/2011  . Dysuria 06/17/2011  . Other abnormal blood chemistry 0/30/2012  . Blood in stool 06/15/2012  . Other acquired deformity of toe 12/16/2011  . Corns and callosities 07/01/2011    Medications:  Scheduled:  . antiseptic oral rinse  7 mL Mouth Rinse BID  . donepezil  10 mg Oral Daily  . levothyroxine  100 mcg Oral QAC breakfast  . phenytoin  100 mg Oral q morning - 10a  . phenytoin  200 mg Oral QHS    Assessment: 81 year old female transferred from [facility] on heparin for PE. HL therapeutic (0.31) on 1100 units/hr. Heparin level due today to confirm therapeutic level.  S/p IVC filter placement 11/10 - spoke with RN and heparin was not turned off at any time, no issues. Hgb stable at 11, Plts wnl, INR yesterday 1.22, albumin low 2.3. No bleeding reported. Bridging to warfarin today for PE treatment. Cannot transition to a NOAC due to major interaction with phenytoin. Due to age, weight, albumin < 2.5, and concomitant phenytoin, will start at low dose.   Goal of Therapy:  Heparin level 0.3-0.7 units/ml  INR 2-3 Monitor platelets by anticoagulation protocol: Yes   Plan:  - Warfarin 2.5 mg x1 - Continue heparin at 1100 units/hr - F/u heparin level - Monitor daily INR, HL, CBC, and s/sx of bleeding  Joya San, PharmD Clinical Pharmacy Resident Pager # [phone] 03/30/2015 10:59 AM   **Addendum  1400 HL remains therapeutic at 0.37.  Continue heparin at 1100 units/hr. Monitor daily AM HL, CBC, and s/sx of bleeding.  Joya San, PharmD Clinical Pharmacy Resident Pager # [phone] 03/30/2015 2:56 PM

## 2015-03-31 DIAGNOSIS — F0280 Dementia in other diseases classified elsewhere without behavioral disturbance: Secondary | ICD-10-CM

## 2015-03-31 DIAGNOSIS — G3183 Dementia with Lewy bodies: Secondary | ICD-10-CM

## 2015-03-31 LAB — BASIC METABOLIC PANEL
Anion gap: 9 (ref 5–15)
BUN: 9 mg/dL (ref 6–20)
CALCIUM: 8.8 mg/dL — AB (ref 8.9–10.3)
CO2: 28 mmol/L (ref 22–32)
CREATININE: 0.55 mg/dL (ref 0.44–1.00)
Chloride: 103 mmol/L (ref 101–111)
GFR calc Af Amer: >60 mL/min (ref >60)
Glucose, Bld: 90 mg/dL (ref 65–99)
POTASSIUM: 4.2 mmol/L (ref 3.5–5.1)
SODIUM: 140 mmol/L (ref 135–145)

## 2015-03-31 LAB — CBC
HEMATOCRIT: 35.1 % — AB (ref 36.0–46.0)
Hemoglobin: 11 g/dL — ABNORMAL LOW (ref 12.0–15.0)
MCH: 30.8 pg (ref 26.0–34.0)
MCHC: 31.3 g/dL (ref 30.0–36.0)
MCV: 98.3 fL (ref 78.0–100.0)
Platelets: 260 10*3/uL (ref 150–400)
RBC: 3.57 MIL/uL — ABNORMAL LOW (ref 3.87–5.11)
RDW: 13.9 % (ref 11.5–15.5)
WBC: 9 10*3/uL (ref 4.0–10.5)

## 2015-03-31 LAB — HEPARIN LEVEL (UNFRACTIONATED)
Heparin Unfractionated: 0.25 IU/mL — ABNORMAL LOW (ref 0.30–0.70)
Heparin Unfractionated: 0.49 IU/mL (ref 0.30–0.70)

## 2015-03-31 LAB — PROTIME-INR
INR: 1.25 (ref 0.00–1.49)
PROTHROMBIN TIME: 15.8 s — AB (ref 11.6–15.2)

## 2015-03-31 MED ORDER — WARFARIN SODIUM 3 MG PO TABS
3.0000 mg | ORAL_TABLET | Freq: Once | ORAL | Status: AC
  Administered 2015-03-31: 3 mg via ORAL
  Filled 2015-03-31 (×2): qty 1

## 2015-03-31 NOTE — Progress Notes (Signed)
Transfer report received from 37M at 1208 and pt arrived to the unit at 1235 via bed with IV intact and heparin transfusing. Pt alert and verbally responsive; no pressure ulcer or wound noted; pt oriented to the unit; remains on 5-6 liters of oxygen nasal cannula; pt denies any distress, sob or discomfort; pt VSS, telemetry applied and verified; continuous pulse ox applied as ordered; pt resting comfortably in bed with call light within reach. Will report off to oncoming RN. Delia Heady RN

## 2015-03-31 NOTE — Clinical Social Work Note (Signed)
Clinical Social Work Assessment  Patient Details  Name: Rebecca [name] MRN: [id number] Date of Birth: 01/09/1928  Date of referral:  03/31/15               Reason for consult:  Discharge Planning                Permission sought to share information with:  Family Supports, Case Manager, Chartered certified accountant granted to share information::  Yes, Verbal Permission Granted  Name::      Rebecca [name] )  Agency::   ([facility]-SNF )  Relationship::   (Son)  Contact Information:   [phone])  Housing/Transportation Living arrangements for the past 2 months:  [place] of Information:  Adult Children Patient Interpreter Needed:  None Criminal Activity/Legal Involvement Pertinent to Current Situation/Hospitalization:  No - Comment as needed Significant Relationships:  Adult Children, Other Family Members Lives with:  Facility Resident Do you feel safe going back to the place where you live?  Yes Need for family participation in patient care:  No (Coment)  Care giving concerns:  Patient admitted from [facility] SNF.   Social Worker assessment / plan:  Holiday representative spoke with pt's son, Rebecca [name] in reference to pt's care and return to [facility]. CSW introduced CSW role and discharge planning process. [name] confirmed pt was admitted from [facility]. Pt is a resident of [place] [facility] where she has been residing for several years. Patient was recently discharged on 10/31 to [facility] SNF. [name] would prefer that pt transitions back to skilled care at [facility]. No further concerns reported at this time. CSW remains available as needed.   Employment status:  Retired Forensic scientist:  Medicare PT Recommendations:  Not assessed at this time Information / Referral to community resources:  [place]  Patient/Family's Response to care:  [name] and family agreeable to pt returning back to [facility] SNF. [name] pleasant, supportive and appreciated  social work intervention.   Patient/Family's Understanding of and Emotional Response to Diagnosis, Current Treatment, and Prognosis:  Pt's family understanding of medical intervention and supportive of on-going treatment.   Emotional Assessment Appearance:  Developmentally appropriate Attitude/Demeanor/Rapport:  Unable to Assess Affect (typically observed):  Unable to Assess Orientation:  Oriented to Self, Oriented to Place Alcohol / Substance use:  Not Applicable Psych involvement (Current and /or in the community):  No (Comment)  Discharge Needs  Concerns to be addressed:  Care Coordination Readmission within the last 30 days:  Yes Current discharge risk:  Dependent with Mobility Barriers to Discharge:  Continued Medical Work up   [employer], MSW, LCSWA [phone] 03/31/2015 4:07 PM

## 2015-03-31 NOTE — Progress Notes (Signed)
PULMONARY / CRITICAL CARE MEDICINE   Name: Rebecca [name] MRN: [id number] DOB: 1927/09/21    ADMISSION DATE:  03/28/2015 CONSULTATION DATE:  03/28/2015  REFERRING MD :  Dr. Deno Etienne [facility]  CHIEF COMPLAINT:  Pulmonary Emboli with Right Heart Strain  INITIAL PRESENTATION: 81 y.o. Female with discharge from hospital on 03/19/15 after being admitted on 03/11/15 with sepsis secondary to pneumonia. Patient reportedly was on Lovenox for DVT prophylaxis during that admission. Prior to that she was living in an assisted living and was discharged to a skilled nursing facility. She had worsening hypoxia & dyspnea at the nursing facility and was sent to the E.D. for further workup where she was found to be hypoxic and hypotensive  STUDIES:  CT Head 08/12/09 - Chronic microvascular ischemic changes. No evidence of infarction or mass lesion. CTA Chest 03/28/15 - Filling defects bilateral lobes c/w emboli. RV:LV ratio 1.2. No filling defects in RV. Venous Duplex 03/28/15 - Prelim findings c/w mobile DVT in left proximal femoral vein extending into common femoral. Occlusive DVT bilateral posterior tibial, peroneal, & soleal veins. TTE 03/28/15 - LV normal in size w/ mild LVH. EF 60-65%. No wall motion abnormalities. Grade 1 diastolic dysfunction. LA normal in size. RA mildly dilated. RV mildly dilated with preserved systolic function. PASP 67mHg. No aortic stenosis or regurg. No mitral stenosis or regurg. No pulmonic regurg. Mild tricuspid regurg. No pericardial effusion.  SIGNIFICANT EVENTS: 11/9 - [facility] 11/10- placement of retrievable infrarenal IVC filter by IR.   SUBJECTIVE:   Patient resting in bed.    VITAL SIGNS: Temp:  [97.4 F (36.3 C)-98.7 F (37.1 C)] 98.7 F (37.1 C) (11/12 0313) Pulse Rate:  [66-86] 72 (11/12 0200) Resp:  [18-28] 22 (11/12 0200) BP: (91-145)/(61-74) 109/68 mmHg (11/12 0200) SpO2:  [73 %-100 %] 95 %  (11/12 0200) FiO2 (%):  [80 %] 80 % (11/11 0800) Weight:  [119 lb 0.8 oz (54 kg)] 119 lb 0.8 oz (54 kg) (11/12 0300) HEMODYNAMICS:   VENTILATOR SETTINGS: Vent Mode:  [-]  FiO2 (%):  [80 %] 80 % INTAKE / OUTPUT:  Intake/Output Summary (Last 24 hours) at 03/31/15 0554 Last data filed at 03/31/15 0000  Gross per 24 hour  Intake    399 ml  Output   1000 ml  Net   -601 ml    PHYSICAL EXAMINATION: General:  Sleeping soundly in NAD.  Integument:  Warm & dry. No rash on exposed skin. No bruising. HEENT:  Dry mucus membranes.  Cardiovascular:  RRR.  No edema. No appreciable JVD.  Pulmonary:  Mild crackles R>L.  No accessory muscle use on 4L [place]. Abdomen: Soft. Normal bowel sounds. Nondistended. Grossly nontender. EXT: WWP, no edema Neurological:  Sleeping.   LABS:  CBC  Recent Labs Lab 03/29/15 0232 03/30/15 0505 03/31/15 0245  WBC 9.4 8.7 9.0  HGB 10.9* 11.0* 11.0*  HCT 34.1* 35.0* 35.1*  PLT 203 239 260   Coag's  Recent Labs Lab 03/28/15 1137 03/28/15 1913 03/29/15 0232 03/30/15 1404 03/31/15 0245  APTT 36 73* 98*  --   --   INR 1.13 1.20 1.22 1.22 1.25   BMET  Recent Labs Lab 03/29/15 1045 03/30/15 0505 03/31/15 0245  NA 140 142 140  K 3.9 3.9 4.2  CL 105 104 103  CO2 '29 30 28  '$ BUN '13 10 9  '$ CREATININE 0.61 0.56 0.55  GLUCOSE 95 83  90   Electrolytes  Recent Labs Lab 03/29/15 0232 03/29/15 1045 03/30/15 0505 03/31/15 0245  CALCIUM 8.3* 8.6* 8.8* 8.8*  MG 2.0  --   --   --   PHOS 4.2  --   --   --    Sepsis Markers No results for input(s): LATICACIDVEN, PROCALCITON, O2SATVEN in the last 168 hours. ABG No results for input(s): PHART, PCO2ART, PO2ART in the last 168 hours. Liver Enzymes  Recent Labs Lab 03/29/15 1045  AST 33  ALT 25  ALKPHOS 75  BILITOT 0.1*  ALBUMIN 2.3*   Cardiac Enzymes  Recent Labs Lab 03/28/15 1452 03/28/15 2338  TROPONINI 0.54* 0.44*   Glucose  Recent Labs Lab 03/28/15 2036  GLUCAP 116*     Imaging No results found.   ASSESSMENT / PLAN:  PULMONARY A: Acute Bilateral Pulmonary Emboli Acute Hypoxic Respiratory Failure - Secondary to acute pulmonary emboli. S/p IVC filter on 11/10 P:   Heparin bridge to coumadin per pharmacy Repeat CBC qAM.  O2 to maintain O2 sat >92%.  CARDIOVASCULAR A:  Cor Pulmonale - Likely secondary to acute pulmonary emboli. Elevated troponins- improving  Hypotensive on admission: BPs stable in 100s/50s P:  Monitor on Telemetry Therapeutic anticoagulation with Heparin per pharmacy protocol KVO  RENAL A:   No acute issues.  P:   Monitor UOP ~600cc/24 hours No foley catheter  GASTROINTESTINAL A:   No acute issues.  P:   Full liquid diet, ADAT   HEMATOLOGIC A:   Systemic Anticoagulation for PE Acute Pulmonary Emboli - Likely provoked. LLE DVT - Mobile clot s/p filter placement  P:  Monitor Hgb Holding off on hypercoagulable workup given inciting factor  INFECTIOUS A:   No acute issues. Recent treatment for pneumonia.  P:   Monitor for fever & leukocytosis.  ENDOCRINE A:   H/O Hypothryoidism    P:   Restart Synthroid   NEUROLOGIC A:   Chronic Cognitive/Memory Deficit - Probable dementia. Chronic Benzodiazepine Use H/O Seizure D/O - None in years. At a high risk for delirium  P:   Monitor for signs of Benzo withdrawal Home Aricept & Zoloft  Home Dilantin (not at goal level currently) Seizure precautions  FAMILY  - Updates: last updated 11/10  - Inter-disciplinary family meet or Palliative Care meeting due by:  11/16  Patient did on well on [place] overnight.  Will plan for transfer to med surg floor today.  Ronnie Doss, DO [facility] Resident  03/31/2015, 5:54 AM  Attending Note:  81 year old female presenting with a PE with right heart strain and cor pulmonale.  The patient was started on heparin and now bridging to coumadin.  I see no indication for lytics.  The patient's O2  requirement are much lower.  Hemodynamically stable.  Tolerating movement.  Will transfer to tele and continue heparin bridge to coumadin.  I reviewed chest CT myself, PE's noted.  Discussed with TRH-MD.  PE:  - Heparin.  - Coumadin per pharmacy.  Hypoxemia: due to PE:  - Supplemental O2.  - Titrate for sat of 88-92%.  - Will need ambulatory desat prior to discharge for home O2.  Pulmonary HTN: due to PE.  - Treat PE.  - F/U echo.  - No indication for further work up.  Dementia:  - Aricept.  - Zoloft.  - Avoid benzos.  Hypothyroid: replace.  Transfer to tele and to [facility] service with PCCM off 11/13.  Patient seen and examined, agree with above note.  I dictated the care and orders written for this patient under my direction.  Nickoles Gregori G Sakai Heinle, MD [phone] 

## 2015-03-31 NOTE — Progress Notes (Signed)
ANTICOAGULATION CONSULT NOTE - Follow Up Consult  Pharmacy Consult for Heparin  Indication: pulmonary embolus, s/p IVC filter placement on 11/10  Allergies  Allergen Reactions  . Morphine And Related Other (See Comments)    Altered mental state   . Sulfa Antibiotics Other (See Comments)    Reaction: unknown    Patient Measurements: Height: 5' (152.4 cm) Weight: 119 lb 0.8 oz (54 kg) IBW/kg (Calculated) : 45.5  Vital Signs: Temp: 98.7 F (37.1 C) (11/12 0313) Temp Source: Axillary (11/12 0313) BP: 109/68 mmHg (11/12 0200) Pulse Rate: 72 (11/12 0200)  Labs:  Recent Labs  03/28/15 1137  03/28/15 1452 03/28/15 1913  03/28/15 2338 03/29/15 0232  03/29/15 1045  03/30/15 0505 03/30/15 1404 03/31/15 0245  HGB  --   < >  --  11.3*  --   --  10.9*  --   --   --  11.0*  --  11.0*  HCT  --   < >  --  34.9*  --   --  34.1*  --   --   --  35.0*  --  35.1*  PLT  --   < >  --  251  --   --  203  --   --   --  239  --  260  APTT 36  --   --  73*  --   --  98*  --   --   --   --   --   --   LABPROT 14.6  --   --  15.4*  --   --  15.6*  --   --   --   --  15.6* 15.8*  INR 1.13  --   --  1.20  --   --  1.22  --   --   --   --  1.22 1.25  HEPARINUNFRC  --   --   --   --   < >  --   --   < >  --   < > 0.31 0.37 0.25*  CREATININE  --   < >  --   --   --   --  0.64  --  0.61  --  0.56  --  0.55  TROPONINI  --   --  0.54*  --   --  0.44*  --   --   --   --   --   --   --   < > = values in this interval not displayed.  Estimated Creatinine Clearance: 36.9 mL/min (by C-G formula based on Cr of 0.55).  Assessment: Sub-therapeutic heparin level this AM, IVC filter in place  Goal of Therapy:  Heparin level 0.3-0.7 units/ml Monitor platelets by anticoagulation protocol: Yes   Plan:  -Increase heparin to 1200 units/hr -1200 HL  Rj Pedrosa 03/31/2015,4:11 AM

## 2015-03-31 NOTE — Progress Notes (Signed)
**  Critical Care Interval Note**  Patient resting peacefully in bed.  [place] @ 4L/min.  VSS.  Anticipate will be able to transfer to floor in am.  Eliam Snapp M. Lajuana Ripple, DO PGY-2, [place]

## 2015-03-31 NOTE — Progress Notes (Signed)
ANTICOAGULATION CONSULT NOTE - Follow Up Consult  Pharmacy Consult for Heparin  Indication: pulmonary embolus, s/p IVC filter placement on 11/10  Allergies  Allergen Reactions  . Morphine And Related Other (See Comments)    Altered mental state   . Sulfa Antibiotics Other (See Comments)    Reaction: unknown    Patient Measurements: Height: 5' (152.4 cm) Weight: 119 lb 0.8 oz (54 kg) IBW/kg (Calculated) : 45.5  Vital Signs: Temp: 97.4 F (36.3 C) (11/12 0833) Temp Source: Oral (11/12 0833) BP: 115/69 mmHg (11/12 1000) Pulse Rate: 74 (11/12 1100)  Labs:  Recent Labs  03/28/15 1452  03/28/15 1913  03/28/15 2338  03/29/15 0232  03/29/15 1045  03/30/15 0505 03/30/15 1404 03/31/15 0245 03/31/15 1138  HGB  --   < > 11.3*  --   --   --  10.9*  --   --   --  11.0*  --  11.0*  --   HCT  --   < > 34.9*  --   --   --  34.1*  --   --   --  35.0*  --  35.1*  --   PLT  --   < > 251  --   --   --  203  --   --   --  239  --  260  --   APTT  --   --  73*  --   --   --  98*  --   --   --   --   --   --   --   LABPROT  --   < > 15.4*  --   --   --  15.6*  --   --   --   --  15.6* 15.8*  --   INR  --   < > 1.20  --   --   --  1.22  --   --   --   --  1.22 1.25  --   HEPARINUNFRC  --   --   --   < >  --   --   --   < >  --   < > 0.31 0.37 0.25* 0.49  CREATININE  --   --   --   --   --   < > 0.64  --  0.61  --  0.56  --  0.55  --   TROPONINI 0.54*  --   --   --  0.44*  --   --   --   --   --   --   --   --   --   < > = values in this interval not displayed.  Estimated Creatinine Clearance: 36.9 mL/min (by C-G formula based on Cr of 0.55).  Assessment: Heparin is now therapeutic again on the rate that was increased this AM.   Goal of Therapy:  Heparin level 0.3-0.7 units/ml Monitor platelets by anticoagulation protocol: Yes   Plan:   Cont heparin to 1200 units/hr Daily HL and CBC  Onnie Boer, PharmD Pager: [phone] 03/31/2015 12:20 PM

## 2015-03-31 NOTE — NC FL2 (Signed)
[place] MEDICAID FL2 LEVEL OF CARE SCREENING TOOL     IDENTIFICATION  Patient Name: Rebecca [name] Birthdate: [birth year] Sex: female Admission Date (Current Location): 03/28/2015  [facility] and [place] Number:  [employer] )   Facility and Address:  [facility] [place] [address], [place], [place] [address]      Provider Number: [id number]  Attending Physician Name and Address:  Rush Farmer, MD  Relative Name and Phone Number:  Agatha Duplechain    Current Level of Care: Hospital Recommended Level of Care: [place] Prior Approval Number:    Date Approved/Denied:   PASRR Number:  [id number])  Discharge Plan: SNF    Current Diagnoses: Patient Active Problem List   Diagnosis Date Noted  . Elevated troponin 03/28/2015  . Abnormal finding on EKG-new inferior/ septal TWI 03/28/2015  . DNR no code (do not resuscitate) 03/28/2015  . Pulmonary emboli ([place]) 03/28/2015  . Acute hypoxemic respiratory failure ([place]) 03/28/2015  . DVT, femoral, acute ([place]) 03/28/2015  . Acute pulmonary embolus ([place]) 03/28/2015  . Headache 03/23/2015  . Acute respiratory failure with hypoxia ([place]) 03/12/2015  . Dyslipidemia 03/12/2015  . Acute encephalopathy 03/12/2015  . Healthcare-associated pneumonia-hospitalized 10/23- 03/19/15 03/11/2015  . Leukocytosis 03/11/2015  . Seizure disorder ([place]) 03/11/2015  . Depression 09/26/2014  . Memory impairment- SNF/ assited living pt 09/19/2012  . Hypothyroidism 12/08/2006    Orientation ACTIVITIES/SOCIAL BLADDER RESPIRATION    Self, Time  Active, Family supportive Continent O2 (As needed)  BEHAVIORAL SYMPTOMS/MOOD NEUROLOGICAL BOWEL NUTRITION STATUS   (NONE)  (NONE) Continent Diet (Regular )  PHYSICIAN VISITS COMMUNICATION OF NEEDS Height & Weight Skin    Verbally '5\' 6"'$  (167.6 cm) 119 lbs. Normal          AMBULATORY STATUS RESPIRATION    Supervision limited O2 (As needed)      Personal Care Assistance  Level of Assistance  Bathing, Dressing Bathing Assistance: Limited assistance   Dressing Assistance: Limited assistance      Functional Limitations Info   (NONE)             [place]  PT (By licensed PT), OT (By licensed OT)     PT Frequency: 5 OT Frequency: 5           Additional Factors Info  Code Status, Allergies Code Status Info: DNR  Allergies Info: Allergies: Morphine And Related, Sulfa Antibiotics           Current Medications (03/31/2015): Current Facility-Administered Medications  Medication Dose Route Frequency Provider Last Rate Last Dose  . 0.9 %  sodium chloride infusion  250 mL Intravenous PRN Javier Glazier, MD 10 mL/hr at 03/29/15 2000 250 mL at 03/29/15 2000  . acetaminophen (TYLENOL) tablet 650 mg  650 mg Oral Q4H PRN Rahul P Desai, PA-C   650 mg at 03/30/15 1151  . donepezil (ARICEPT) tablet 10 mg  10 mg Oral Daily Raylene Miyamoto, MD   10 mg at 03/31/15 0936  . haloperidol lactate (HALDOL) injection 1 mg  1 mg Intravenous Q6H PRN Raylene Miyamoto, MD   1 mg at 03/30/15 1609  . iohexol (OMNIPAQUE) 300 MG/ML solution 150 mL  150 mL Intravenous Once PRN Jacqulynn Cadet, MD   50 mL at 03/29/15 1335  . levothyroxine (SYNTHROID, LEVOTHROID) tablet 100 mcg  100 mcg Oral QAC breakfast Archie Patten, MD   100 mcg at 03/31/15 0734  . phenytoin (DILANTIN) ER capsule 100 mg  100 mg Oral q morning - 10a Assunta Found Stone, RPH   100 mg at 03/31/15 7782  . phenytoin (DILANTIN) ER capsule 200 mg  200 mg Oral QHS Assunta Found Stone, RPH   200 mg at 03/30/15 2108  . warfarin (COUMADIN) tablet 3 mg  3 mg Oral ONCE-1800 Rush Farmer, MD      . Warfarin - Pharmacist Dosing Inpatient   Does not apply [place], RPH   0  at 03/30/15 1800   Do not use this list as official medication orders. Please verify with discharge summary.  Discharge Medications:   Medication List    ASK your doctor about these medications         acetaminophen 650 MG CR tablet  Commonly known as:  TYLENOL  Take 650 mg by mouth 2 (two) times daily. x7 days. Started 03/23/15.     antiseptic oral rinse Liqd  15 mLs by Mouth Rinse route 4 (four) times daily as needed for dry mouth.     CALCIUM 600+D 600-400 MG-UNIT tablet  Generic drug:  Calcium Carbonate-Vitamin D  Take 1 tablet by mouth daily.     clonazePAM 0.5 MG tablet  Commonly known as:  KLONOPIN  Take 1 tablet (0.5 mg total) by mouth 2 (two) times daily as needed (anxiety).     donepezil 10 MG tablet  Commonly known as:  ARICEPT  Take 10 mg by mouth daily.     guaiFENesin-dextromethorphan 100-10 MG/5ML syrup  Commonly known as:  ROBITUSSIN DM  Take 5 mLs by mouth every 4 (four) hours as needed for cough.     lactose free nutrition Liqd  Take 237 mLs by mouth 2 (two) times daily. With lunch and dinner     levofloxacin 750 MG tablet  Commonly known as:  LEVAQUIN  Take 1 tablet (750 mg total) by mouth every other day.     levothyroxine 100 MCG tablet  Commonly known as:  SYNTHROID, LEVOTHROID  Take 1 tablet (100 mcg total) by mouth daily before breakfast. For thyroid     meloxicam 15 MG tablet  Commonly known as:  MOBIC  TAKE 1 TABLET BY MOUTH ONCE DAILY TO HELP ARTHRITIS     MUCINEX 600 MG 12 hr tablet  Generic drug:  guaiFENesin  Take 600 mg by mouth 2 (two) times daily. For 30 days. Started 03/22/15.     omega-3 acid ethyl esters 1 G capsule  Commonly known as:  LOVAZA  Take 1 g by mouth daily. Fatty Acid one daily     ondansetron 4 MG tablet  Commonly known as:  ZOFRAN  Take 1 tablet (4 mg total) by mouth every 6 (six) hours as needed for nausea.     OXYGEN  Inhale 5 L into the lungs continuous.     phenytoin 100 MG ER capsule  Commonly known as:  DILANTIN  Take 1 capsule by mouth in  the morning and 2 capsules  by mouth at bedtime to prevent seizure.     saccharomyces boulardii 250 MG capsule  Commonly known as:  FLORASTOR  Take 250 mg by mouth 2  (two) times daily.     sertraline 50 MG tablet  Commonly known as:  ZOLOFT  One daily to improve anxiety and depression     TAB-A-VITE PO  Take 1 tablet by mouth daily.     traMADol 50 MG tablet  Commonly known as:  ULTRAM  Take 1 tablet (50 mg total)  by mouth every 6 (six) hours as needed for moderate pain or severe pain.     Vitamin D-3 5000 UNITS Tabs  Take 5,000 Units by mouth daily.        Relevant Imaging Results:  Relevant Lab Results:  Recent Labs    Additional Information SSN [id number]    Glendon Axe, MSW, LCSWA [phone] 03/31/2015 4:13 PM

## 2015-04-01 DIAGNOSIS — R413 Other amnesia: Secondary | ICD-10-CM

## 2015-04-01 DIAGNOSIS — R7989 Other specified abnormal findings of blood chemistry: Secondary | ICD-10-CM

## 2015-04-01 DIAGNOSIS — R778 Other specified abnormalities of plasma proteins: Secondary | ICD-10-CM | POA: Insufficient documentation

## 2015-04-01 DIAGNOSIS — I82413 Acute embolism and thrombosis of femoral vein, bilateral: Secondary | ICD-10-CM

## 2015-04-01 LAB — CBC
HCT: 35.7 % — ABNORMAL LOW (ref 36.0–46.0)
Hemoglobin: 11.3 g/dL — ABNORMAL LOW (ref 12.0–15.0)
MCH: 31.1 pg (ref 26.0–34.0)
MCHC: 31.7 g/dL (ref 30.0–36.0)
MCV: 98.3 fL (ref 78.0–100.0)
PLATELETS: 249 10*3/uL (ref 150–400)
RBC: 3.63 MIL/uL — AB (ref 3.87–5.11)
RDW: 13.9 % (ref 11.5–15.5)
WBC: 8.3 10*3/uL (ref 4.0–10.5)

## 2015-04-01 LAB — HEPARIN LEVEL (UNFRACTIONATED): HEPARIN UNFRACTIONATED: 0.31 [IU]/mL (ref 0.30–0.70)

## 2015-04-01 LAB — PROTIME-INR
INR: 1.29 (ref 0.00–1.49)
PROTHROMBIN TIME: 16.2 s — AB (ref 11.6–15.2)

## 2015-04-01 LAB — GLUCOSE, CAPILLARY: GLUCOSE-CAPILLARY: 108 mg/dL — AB (ref 65–99)

## 2015-04-01 MED ORDER — SERTRALINE HCL 25 MG PO TABS
25.0000 mg | ORAL_TABLET | Freq: Every day | ORAL | Status: DC
  Administered 2015-04-01 – 2015-04-04 (×4): 25 mg via ORAL
  Filled 2015-04-01 (×4): qty 1

## 2015-04-01 MED ORDER — HEPARIN BOLUS VIA INFUSION
2750.0000 [IU] | Freq: Once | INTRAVENOUS | Status: AC
  Administered 2015-04-01: 2750 [IU] via INTRAVENOUS
  Filled 2015-04-01: qty 2750

## 2015-04-01 MED ORDER — WARFARIN SODIUM 6 MG PO TABS
6.0000 mg | ORAL_TABLET | Freq: Once | ORAL | Status: AC
  Administered 2015-04-01: 6 mg via ORAL
  Filled 2015-04-01: qty 1

## 2015-04-01 MED ORDER — HEPARIN (PORCINE) IN NACL 100-0.45 UNIT/ML-% IJ SOLN
1200.0000 [IU]/h | INTRAMUSCULAR | Status: DC
  Administered 2015-04-01: 1100 [IU]/h via INTRAVENOUS
  Administered 2015-04-03: 1150 [IU]/h via INTRAVENOUS
  Administered 2015-04-04: 1200 [IU]/h via INTRAVENOUS
  Filled 2015-04-01 (×4): qty 250

## 2015-04-01 NOTE — Progress Notes (Signed)
ANTICOAGULATION CONSULT NOTE - Follow Up Consult  Pharmacy Consult for Heparin Indication: pulmonary embolus, s/p IVC filter placement on 11/10  Allergies  Allergen Reactions  . Morphine And Related Other (See Comments)    Altered mental state   . Sulfa Antibiotics Other (See Comments)    Reaction: unknown    Patient Measurements: Height: '5\' 5"'$  (165.1 cm) Weight: 119 lb 14.9 oz (54.4 kg) IBW/kg (Calculated) : 57  Vital Signs: Temp: 97.8 F (36.6 C) (11/13 1349) Temp Source: Oral (11/13 1349) BP: 123/67 mmHg (11/13 1349) Pulse Rate: 72 (11/13 1349)  Labs:  Recent Labs  03/30/15 0505 03/30/15 1404 03/31/15 0245 03/31/15 1138 04/01/15 0536 04/01/15 1817  HGB 11.0*  --  11.0*  --  11.3*  --   HCT 35.0*  --  35.1*  --  35.7*  --   PLT 239  --  260  --  249  --   LABPROT  --  15.6* 15.8*  --  16.2*  --   INR  --  1.22 1.25  --  1.29  --   HEPARINUNFRC 0.31 0.37 0.25* 0.49  --  0.31  CREATININE 0.56  --  0.55  --   --   --     Estimated Creatinine Clearance: 44.2 mL/min (by C-G formula based on Cr of 0.55).  Assessment: 81 y/o female on heparin to bridge warfarin for DVT/PE. Heparin level is therapeutic at 0.31 which is on the low end of normal at 1100 units/hr. No bleeding noted.   Goal of Therapy:  Heparin level 0.3-0.7 units/ml Monitor platelets by anticoagulation protocol: Yes  Plan:  Increase heparin drip to 1150 units/hr to keep level > 0.3 Daily heparin level and CBC Monitor for s/sx of bleeding  [facility], [place] O[place]a.D., BCPS Clinical Pharmacist Pager: [phone] 04/01/2015 7:14 PM

## 2015-04-01 NOTE — Progress Notes (Signed)
ANTICOAGULATION CONSULT NOTE - Follow Up Consult  Pharmacy Consult for Heparin/warfarin Indication: pulmonary embolus, s/p IVC filter placement on 11/10  Allergies  Allergen Reactions  . Morphine And Related Other (See Comments)    Altered mental state   . Sulfa Antibiotics Other (See Comments)    Reaction: unknown    Patient Measurements: Height: '5\' 5"'$  (165.1 cm) Weight: 119 lb 14.9 oz (54.4 kg) IBW/kg (Calculated) : 57  Vital Signs: Temp: 99.7 F (37.6 C) (11/13 0533) Temp Source: Oral (11/13 0533) BP: 109/43 mmHg (11/13 0533) Pulse Rate: 76 (11/13 0533)  Labs:  Recent Labs  03/29/15 1045  03/30/15 0505 03/30/15 1404 03/31/15 0245 03/31/15 1138 04/01/15 0536  HGB  --   < > 11.0*  --  11.0*  --  11.3*  HCT  --   --  35.0*  --  35.1*  --  35.7*  PLT  --   --  239  --  260  --  249  LABPROT  --   --   --  15.6* 15.8*  --  16.2*  INR  --   --   --  1.22 1.25  --  1.29  HEPARINUNFRC  --   < > 0.31 0.37 0.25* 0.49  --   CREATININE 0.61  --  0.56  --  0.55  --   --   < > = values in this interval not displayed.  Estimated Creatinine Clearance: 44.2 mL/min (by C-G formula based on Cr of 0.55).  Assessment: Pt on heparin/warfarin for PE. Heparin D/C by provider yesterday however, no documentation of reasoning for discontinuation. Paged MD this morning and was also unsure as to why heparin was discontinued. Will restart heparin now. Pt previously therapeutic at 1200 units/hr.   Day 3 of warfarin therapy with minimal INR increases; 1.29 today. Will likely need higher doses due to metabolic induction by phenytoin. Hgb 11.3, plt 249. No reported bleeding.  Goal of Therapy:  Heparin level 0.3-0.7 units/ml Monitor platelets by anticoagulation protocol: Yes INR 2-3  Plan:  Heparin bolus 2750 units Heparin gtt 1100 units/hr Warfarin '6mg'$  x1 tonight 8-hr HL - 1800 11/13 Monitor daily INR, HL, CBC, and s/sx bleeding  Stephens November, PharmD PGY1 Resident Pager:  [phone] 04/01/2015 9:31 AM

## 2015-04-01 NOTE — Progress Notes (Signed)
PROGRESS NOTE  Rebecca [name] CVE:[id number] DOB: Jul 02, 1927 DOA: 03/28/2015 PCP: Estill Dooms, MD  Brief History 81 year old female with a history of seizures, hypothyroidism, diverticulosis, depression presented with dyspnea, hypoxemia, and hypotension at her nursing facility. with discharge from hospital on 03/19/15 after being admitted on 03/11/15 with sepsis secondary to pneumonia. Patient reportedly was on Lovenox for DVT prophylaxis during that admission. Prior to that she was living in an assisted living and was discharged to a skilled nursing facility.  At baseline, the patient has cognitive impairment. The patient was initially placed on a nonrebreather to maintain her oxygenation. CT angiogram of the chest was obtained as part of the initial workup and revealed bilateral pulmonary emboli with indication of right heart strain on the CT. Transthoracic echocardiogram revealed largely normal left ventricular, aortic valve, and mitral valve function. Her right ventricle is mildly dilated with an elevated pulmonary artery systolic pressure (52 mmHg) consistent with her acute pulmonary emboli.  TTE also showed mild LVH. EF 60-65%. No wall motion abnormalities. Grade 1 diastolic dysfunction.  The patient was admitted to ICU and started on a heparin drip. Venous duplex of the lower extremities revealed a DVT in the left proximal femoral vein and DVT in the bilateral posterior temporal, peroneal, and soleal veins.  On 03/29/2015, infrarenal IVC filter was placed by IR. The patient's troponins were elevated, but this was thought to be due to the patient's pulmonary emboli.  The patient was fluid resuscitated with improvement of her blood pressure.   Assessment/Plan: Acute respiratory failure with hypoxemia  -Secondary to pulmonary emboli -Initially placed on heparin drip--continue heparin bridge -Plan to transition to factor X inhibitor -presently stable on 3L with saturation 94-95% -wean  oxygen to keep oxygen sat >92% Pulmonary emboli with DVT  -Initially placed on heparin  -warfarin started on 03/30/15 with heparin bridge -03/28/2015 TTE--EF 60-65%. No wall motion abnormalities. Grade 1 diastolic dysfunction. LA normal in size. RA mildly dilated. RV mildly dilated with preserved systolic function. PASP 17mHg. No aortic stenosis or regurg Cor pulmonale -Secondary to pulmonary emboli -Hemodynamically stable presently Elevated troponins -Likely secondary to pulmonary emboli Hypothyroidism  -Continue Synthroid  Cognitive impairment  -At risk for sundowning and delirium  -Continue Aricept and Zoloft -Continue Dilantin -Monitor for signs of benzodiazepine withdrawal Seizure disorder  -No seizures in many years    Family Communication:   Pt at beside Disposition Plan:   SNF in 1-2 days      Procedures/Studies: Dg Chest 2 View  03/28/2015  CLINICAL DATA:  Low oxygen.  Increased short of breath. EXAM: CHEST  2 VIEW COMPARISON:  CT 03/16/2015, chest radiograph 03/16/2015 FINDINGS: Normal cardiac silhouette. There is bilateral interstitial lung disease. There is slight increase in density in the upper lobes suggesting superimposed infection or edema. No pneumothorax. IMPRESSION: Interstitial lung disease with superimposed mild pulmonary edema versus pneumonia. Electronically Signed   By: SSuzy BouchardM.D.   On: 03/28/2015 12:31   Dg Chest 2 View  03/11/2015  CLINICAL DATA:  Sepsis. Fever. Altered mental status. Urinary tract infection. EXAM: CHEST  2 VIEW COMPARISON:  Chest x-rays dated 07/25/2013 and 01/28/2011 and 01/22/2011 FINDINGS: Heart size and pulmonary vascularity are normal. The patient has developed patchy bilateral peripheral densities which are similar to those present in September 2012. No effusions. No acute osseous abnormality. IMPRESSION: Patchy bilateral pulmonary infiltrates. Findings are consistent with pneumonia/pneumonitis. Electronically Signed    By: JLorriane Shire  M.D.   On: 03/11/2015 18:54   Ct Chest Wo Contrast  03/16/2015  CLINICAL DATA:  81 year old female with shortness of Breath, fever, altered mental status. Worsening lung opacity on chest radiographs. Initial encounter. EXAM: CT CHEST WITHOUT CONTRAST TECHNIQUE: Multidetector CT imaging of the chest was performed following the standard protocol without IV contrast. COMPARISON:  Portable chest 0939 hours today, and earlier. FINDINGS: Cardiomegaly. Small to moderate layering bilateral pleural effusions. No pericardial effusion. Calcified atherosclerosis of the aorta and coronary arteries. Tortuous thoracic aorta and proximal great vessels. Major airways are patent aside from atelectatic changes. Widespread and confluent pulmonary ground-glass opacity in the lungs right greater than left, with associated interstitial thickening. Areas of sparing also demonstrate attenuated vasculature suggestive of air trapping. This may reflect underlying emphysema. No areas of consolidation. Several calcified probable granulomas in the right upper and mid lung. Furthermore, there is fine reticulonodular calcification at the lung bases which is evident on multiple prior chest x-rays, suggesting alveolar microlithiasis. Mild AP window and right peritracheal lymph node enlargement (up to 13 mm) appears reactive in nature. No axillary lymphadenopathy. Visualized noncontrast liver, spleen, pancreas, and bowel in the upper abdomen are within normal limits. There is thickening of the left adrenal gland probably due to adrenal hyperplasia. Grossly negative visualized left kidney. Diffuse spinal disc and endplate degeneration, severe in the lower thoracic levels with extensive vacuum phenomena. No acute osseous abnormality identified. IMPRESSION: 1. Scattered right greater than left pulmonary ground-glass opacity and some septal thickening superimposed on suspected chronic lung disease with emphysema and/or a degree of  pulmonary fibrosis. Favor atypical infection, especially viral. 2. Small to moderate layering bilateral pleural effusions. Reactive appearing mediastinal lymphadenopathy. 3. Cardiomegaly with no pericardial effusion. Electronically Signed   By: Genevie Ann M.D.   On: 03/16/2015 15:02   Ct Angio Chest Pe W/cm &/or Wo Cm  03/28/2015  CLINICAL DATA:  Shortness of Breath EXAM: CT ANGIOGRAPHY CHEST WITH CONTRAST TECHNIQUE: Multidetector CT imaging of the chest was performed using the standard protocol during bolus administration of intravenous contrast. Multiplanar CT image reconstructions and MIPs were obtained to evaluate the vascular anatomy. CONTRAST:  31m OMNIPAQUE IOHEXOL 350 MG/ML SOLN COMPARISON:  03/16/2015 FINDINGS: The lungs are well expanded bilaterally. There again noted diffuse chronic fibrotic changes bilaterally. The degree of chronic last infiltrate has improved in the interval. Multiple calcified granulomas are seen. The previously seen pleural effusions have also resolved in the interval. The thoracic inlet shows no acute abnormality. The thoracic aorta demonstrates a normal branching pattern. Some calcific changes are seen without aneurysmal dilatation or signs of dissection. The coronary arteries demonstrate mild calcifications. The pulmonary artery is well visualized and demonstrates multiple filling defects throughout the lower lobes bilaterally consistent with pulmonary emboli. There is prominence of the right ventricle with evidence of right heart strain. The RV to LV ratio is 1.2. This is indicative of sub massive pulmonary embolism and right heart strain. No definitive filling defects are noted within the right ventricle. There are lymph node is again identified in the aorticopulmonary window as well as in the left suprahilar region stable from the prior exam. No new focal adenopathy is noted. The visualized upper abdomen is within normal limits. Degenerative changes of thoracic spine are again  seen and stable. Review of the MIP images confirms the above findings. IMPRESSION: Positive for acute PE with CT evidence of right heart strain (RV/LV Ratio = 1.2) consistent with at least submassive (intermediate risk) PE. The presence of  right heart strain has been associated with an increased risk of morbidity and mortality. Please activate Code PE by paging [phone]. Chronic fibrotic changes in the lungs bilaterally. Otherwise stable appearance from the prior study. These results were called by telephone at the time of interpretation on 03/28/2015 at 1:57 pm to Dr. Deno Etienne , who verbally acknowledged these results. Electronically Signed   By: Inez Catalina M.D.   On: 03/28/2015 13:58   Ir Ivc Filter Plmt / S&i /img Guid/mod Sed  03/29/2015  INDICATION: 81 year old female with acute sub massive pulmonary embolus. Lower extremity duplex ultrasound demonstrates mobile DVT in the left proximal femoral vein extending into the common femoral vein with additional occlusive thrombus in the bilateral calf veins. Given existing presence of right heart strain and freely mobile thrombus, temporary caval interruption is warranted for dual prophylaxis. The patient is currently heparinized. EXAM: ULTRASOUND GUIDANCE FOR VASCULARACCESS IVC CATHETERIZATION AND VENOGRAM IVC FILTER INSERTION COMPARISON:  CTA chest 03/28/2015; duplex venous ultrasound 03/28/2015 MEDICATIONS: Fentanyl 50 mcg IV; Versed 1 mg IV ANESTHESIA/SEDATION: Sedation Time 15 minutes CONTRAST:  41m OMNIPAQUE IOHEXOL 300 MG/ML  SOLN FLUOROSCOPY TIME:  None COMPLICATIONS: None immediate PROCEDURE: Informed consent was obtained from the patient following explanation of the procedure, risks, benefits and alternatives. The patient understands, agrees and consents for the procedure. All questions were addressed. A time out was performed prior to the initiation of the procedure. Maximal barrier sterile technique utilized including caps, mask, sterile gowns,  sterile gloves, large sterile drape, hand hygiene, and Betadine prep. Under sterile condition and local anesthesia, right internal jugular venous access was performed with ultrasound. An ultrasound image was saved and sent to PACS. Over a guidewire, the IVC filter delivery sheath and inner dilator were advanced into the IVC just above the IVC bifurcation. Contrast injection was performed for an IVC venogram. Through the delivery sheath, a retrievable Denali IVC filter was deployed below the level of the renal veins and above the IVC bifurcation. Limited post deployment venacavagram was performed. The delivery sheath was removed and hemostasis was obtained with manual compression. A dressing was placed. The patient tolerated the procedure well without immediate post procedural complication. FINDINGS: The IVC is patent. No evidence of thrombus, stenosis, or occlusion. No variant venous anatomy. Successful placement of the IVC filter below the level of the renal veins. IMPRESSION: Successful ultrasound and fluoroscopically guided placement of an infrarenal retrievable IVC filter via right jugular approach. This IVC filter is potentially retrievable. The patient will be assessed for filter retrieval by Interventional Radiology in approximately 8-12 weeks. Further recommendations regarding filter retrieval, continued surveillance or declaration of device permanence, will be made at that time. Signed, HCriselda Peaches MD [facility] Electronically Signed   By: HJacqulynn CadetM.D.   On: 03/29/2015 14:52   Dg Chest Port 1 View  03/16/2015  CLINICAL DATA:  Sob; patient also having right elbow pain, especially when abducting right arm; no injury EXAM: PORTABLE CHEST - 1 VIEW COMPARISON:  03/11/2015 FINDINGS: Coarse interstitial opacities throughout both lungs right greater than left, increased since previous exam. Some increase in bibasilar airspace  opacities right greater than left, and new right suprahilar airspace disease. No pneumothorax. Blunting of lateral costophrenic angles suggesting small effusions. Spondylitic changes in the thoracolumbar spine a lumbar dextroscoliosis. IMPRESSION: 1. Worsening asymmetric interstitial and airspace edema or infiltrates, right worse than left. Electronically Signed   By: DLucrezia EuropeM.D.   On: 03/16/2015 10:23  Dg Humerus Right  03/16/2015  CLINICAL DATA:  Sob; patient also having right elbow pain, especially when abducting right arm; no injury EXAM: RIGHT HUMERUS - 2+ VIEW COMPARISON:  None. FINDINGS: There is no evidence of fracture or other focal bone lesions. Soft tissues are unremarkable. Small acromioclavicular spurs. IMPRESSION: Negative humerus. Electronically Signed   By: Lucrezia Europe M.D.   On: 03/16/2015 10:24         Subjective: Patient is pleasantly confused. Denies any fevers, chills, chest pain, shortness breath, nausea, vomiting, diarrhea, abdominal pain. No reports of respiratory distress. No reports of vomiting, diarrhea, uncontrolled pain  Objective: Filed Vitals:   03/31/15 1358 03/31/15 2146 03/31/15 2200 04/01/15 0533  BP: 114/62 116/61  109/43  Pulse: 74 73  76  Temp: 97.8 F (36.6 C) 98.5 F (36.9 C)  99.7 F (37.6 C)  TempSrc: Oral Oral  Oral  Resp: '20 18  18  '$ Height:   '5\' 5"'$  (1.651 m)   Weight:    54.4 kg (119 lb 14.9 oz)  SpO2:  94%  92%    Intake/Output Summary (Last 24 hours) at 04/01/15 0648 Last data filed at 04/01/15 0340  Gross per 24 hour  Intake  567.9 ml  Output   1750 ml  Net -1182.1 ml   Weight change: 0.3 kg (10.6 oz) Exam:   General:  Pt is alert, follows commands appropriately, not in acute distress  HEENT: No icterus, No thrush, No neck mass, Lamb/AT  Cardiovascular: RRR, S1/S2, no rubs, no gallops  Respiratory: Bibasilar crackles, right greater than left. No wheezing. Good air movement  Abdomen: Soft/+BS, non tender, non distended,  no guarding; no hepatosplenomegaly   Extremities: 1+LE edema, No lymphangitis, No petechiae, No rashes, no synovitis; no cyanosis or clubbing  Data Reviewed: Basic Metabolic Panel:  Recent Labs Lab 03/28/15 1138 03/29/15 0232 03/29/15 1045 03/30/15 0505 03/31/15 0245  NA 138 137 140 142 140  K 4.6 4.3 3.9 3.9 4.2  CL 103 104 105 104 103  CO2 '27 24 29 30 28  '$ GLUCOSE 141* 159* 95 83 90  BUN '19 15 13 10 9  '$ CREATININE 0.77 0.64 0.61 0.56 0.55  CALCIUM 9.0 8.3* 8.6* 8.8* 8.8*  MG  --  2.0  --   --   --   PHOS  --  4.2  --   --   --    Liver Function Tests:  Recent Labs Lab 03/29/15 1045  AST 33  ALT 25  ALKPHOS 75  BILITOT 0.1*  PROT 6.2*  ALBUMIN 2.3*   No results for input(s): LIPASE, AMYLASE in the last 168 hours. No results for input(s): AMMONIA in the last 168 hours. CBC:  Recent Labs Lab 03/28/15 1138 03/28/15 1913 03/29/15 0232 03/30/15 0505 03/31/15 0245 04/01/15 0536  WBC 12.3* 10.0 9.4 8.7 9.0 8.3  NEUTROABS 8.3*  --   --   --   --   --   HGB 12.7 11.3* 10.9* 11.0* 11.0* 11.3*  HCT 38.8 34.9* 34.1* 35.0* 35.1* 35.7*  MCV 98.7 98.0 97.7 99.2 98.3 98.3  PLT 255 251 203 239 260 249   Cardiac Enzymes:  Recent Labs Lab 03/28/15 1452 03/28/15 2338  TROPONINI 0.54* 0.44*   BNP: Invalid input(s): POCBNP CBG:  Recent Labs Lab 03/28/15 2036  GLUCAP 116*    Recent Results (from the past 240 hour(s))  MRSA PCR Screening     Status: None   Collection Time: 03/28/15  8:57 PM  Result  Value Ref Range Status   MRSA by PCR NEGATIVE NEGATIVE Final    Comment:        The GeneXpert MRSA Assay (FDA approved for NASAL specimens only), is one component of a comprehensive MRSA colonization surveillance program. It is not intended to diagnose MRSA infection nor to guide or monitor treatment for MRSA infections.      Scheduled Meds: . donepezil  10 mg Oral Daily  . levothyroxine  100 mcg Oral QAC breakfast  . phenytoin  100 mg Oral q morning -  10a  . phenytoin  200 mg Oral QHS  . Warfarin - Pharmacist Dosing Inpatient   Does not apply q1800   Continuous Infusions:    Leydi Winstead, DO  [facility] Pager [phone]  If 7PM-7AM, please contact night-coverage [email or web address] Password [email or web address] 04/01/2015, 6:48 AM   LOS: 4 days

## 2015-04-02 ENCOUNTER — Inpatient Hospital Stay (HOSPITAL_COMMUNITY): Payer: Medicare Other

## 2015-04-02 DIAGNOSIS — E43 Unspecified severe protein-calorie malnutrition: Secondary | ICD-10-CM

## 2015-04-02 LAB — BASIC METABOLIC PANEL
Anion gap: 10 (ref 5–15)
BUN: 11 mg/dL (ref 6–20)
CHLORIDE: 103 mmol/L (ref 101–111)
CO2: 26 mmol/L (ref 22–32)
Calcium: 8.8 mg/dL — ABNORMAL LOW (ref 8.9–10.3)
Creatinine, Ser: 0.64 mg/dL (ref 0.44–1.00)
GFR calc non Af Amer: >60 mL/min (ref >60)
Glucose, Bld: 105 mg/dL — ABNORMAL HIGH (ref 65–99)
POTASSIUM: 4.1 mmol/L (ref 3.5–5.1)
SODIUM: 139 mmol/L (ref 135–145)

## 2015-04-02 LAB — CBC
HEMATOCRIT: 33.7 % — AB (ref 36.0–46.0)
HEMOGLOBIN: 10.8 g/dL — AB (ref 12.0–15.0)
MCH: 32 pg (ref 26.0–34.0)
MCHC: 32 g/dL (ref 30.0–36.0)
MCV: 99.7 fL (ref 78.0–100.0)
Platelets: 233 10*3/uL (ref 150–400)
RBC: 3.38 MIL/uL — AB (ref 3.87–5.11)
RDW: 13.9 % (ref 11.5–15.5)
WBC: 7.6 10*3/uL (ref 4.0–10.5)

## 2015-04-02 LAB — PROTIME-INR
INR: 1.87 — AB (ref 0.00–1.49)
Prothrombin Time: 21.5 seconds — ABNORMAL HIGH (ref 11.6–15.2)

## 2015-04-02 LAB — HEPARIN LEVEL (UNFRACTIONATED): HEPARIN UNFRACTIONATED: 0.35 [IU]/mL (ref 0.30–0.70)

## 2015-04-02 LAB — MAGNESIUM: MAGNESIUM: 1.9 mg/dL (ref 1.7–2.4)

## 2015-04-02 MED ORDER — WARFARIN SODIUM 2 MG PO TABS
2.0000 mg | ORAL_TABLET | Freq: Once | ORAL | Status: AC
  Administered 2015-04-02: 2 mg via ORAL
  Filled 2015-04-02: qty 1

## 2015-04-02 MED ORDER — ENSURE ENLIVE PO LIQD
237.0000 mL | Freq: Two times a day (BID) | ORAL | Status: DC
  Administered 2015-04-03 – 2015-04-04 (×2): 237 mL via ORAL

## 2015-04-02 MED ORDER — ENSURE ENLIVE PO LIQD: 237.0000 mL | Freq: Two times a day (BID) | ORAL | Status: DC

## 2015-04-02 NOTE — Discharge Summary (Addendum)
Physician Discharge Summary  Rebecca [name] RSW:[id number] DOB: [birth year] DOA: 03/28/2015  PCP: Estill Dooms, MD   Addendum to d/c summary by Dr Tat from 11/15.  Admit date: 03/28/2015 Discharge date: 04/04/15 Recommendations for Outpatient Follow-up:  1. Pt will need to follow up with PCP in 2 weeks post discharge 2. Please obtain BMP to evaluate electrolytes and kidney function 3. Please obtain INR on 04/06/15 and adjust coumadin dose for INR 2-3. Monitor INR per protocol. Patient will need at least 6 months of anticoagulation. 4. Please keep patient on 4 L nasal cannula and wean for oxygen saturation greater than 92% 5. Patient is very weak and needs ongoing PT at the facility.  Discharge Diagnoses:  Acute respiratory failure with hypoxemia  -Secondary to pulmonary emboli -Initially placed on heparin drip--continue heparin bridge -Transitioned to coumadin -presently stable on 4L with saturation 94-95% -wean oxygen to keep oxygen sat >92% -maintain negative fluid balance -11/14--CXR unchanged bilateral infiltrates -daily weights--remained stable at 118 pounds  Pulmonary emboli with DVT  -Initially placed on heparin  -warfarin started on 03/30/15 with heparin bridge x 5 days -03/28/2015 TTE--EF 60-65%. No wall motion abnormalities. Grade 1 diastolic dysfunction. LA normal in size. RA mildly dilated. RV mildly dilated with preserved systolic function. PASP 66mHg. No aortic stenosis or regurg  Cor pulmonale -Secondary to pulmonary emboli -Hemodynamically stable presently  Elevated troponins -Likely secondary to pulmonary emboli  Hypothyroidism  -Continue Synthroid   Cognitive impairment  -At risk for sundowning and delirium  -Continue Aricept and Zoloft -Monitor for signs of benzodiazepine withdrawal  Seizure disorder  -No seizures in many years  -Continue Dilantin  Severe Protein Calorie Malnutrition -appreciated nutrition -Continue  ensure   Discharge Condition: Stable  Disposition:  skilled nursing facility  Diet: Regular  CODE STATUS: DO NOT RESUSCITATE   Family communication: Son and daughter at bedside  Wt Readings from Last 3 Encounters:  04/03/15 53.9 kg (118 lb 13.3 oz)  03/26/15 57.153 kg (126 lb)  03/11/15 57.153 kg (126 lb)    History of present illness:  81year old female with a history of seizures, hypothyroidism, diverticulosis, depression presented with dyspnea, hypoxemia, and hypotension at her nursing facility. with discharge from hospital on 03/19/15 after being admitted on 03/11/15 with sepsis secondary to pneumonia. Patient reportedly was on Lovenox for DVT prophylaxis during that admission. Prior to that she was living in an assisted living and was discharged to a skilled nursing facility. At baseline, the patient has cognitive impairment. The patient was initially placed on a nonrebreather to maintain her oxygenation. CT angiogram of the chest was obtained as part of the initial workup and revealed bilateral pulmonary emboli with indication of right heart strain on the CT. Transthoracic echocardiogram revealed largely normal left ventricular, aortic valve, and mitral valve function. Her right ventricle is mildly dilated with an elevated pulmonary artery systolic pressure (52 mmHg) consistent with her acute pulmonary emboli. TTE also showed mild LVH. EF 60-65%. No wall motion abnormalities. Grade 1 diastolic dysfunction. The patient was admitted to ICU and started on a heparin drip. Venous duplex of the lower extremities revealed a DVT in the left proximal femoral vein and DVT in the bilateral posterior temporal, peroneal, and soleal veins. On 03/29/2015, infrarenal IVC filter was placed by IR. The patient's troponins were elevated, but this was thought to be due to the patient's pulmonary emboli. The patient was fluid resuscitated with improvement of her blood  pressure.  Consultants: CCM  Procedure CT angiogram of  the chest 2-D echo Doppler lower extremities  Vitals on discharge Temp: 97.4 F BP: 118/58 MMhg Pulse: 73 RR: 18  SPO2 94% on 4L  Discharge Exam: Filed Vitals:   04/03/15 1401  BP: 104/59  Pulse: 71  Temp: 98.3 F (36.8 C)  Resp: 18   Filed Vitals:   04/02/15 1514 04/02/15 2130 04/03/15 0518 04/03/15 1401  BP: 116/56 115/62 125/60 104/59  Pulse: 74 84 78 71  Temp: 99.2 F (37.3 C) 99.5 F (37.5 C) 99.6 F (37.6 C) 98.3 F (36.8 C)  TempSrc: Oral Oral Oral Oral  Resp: '17 20 18 18  '$ Height:      Weight:   53.9 kg (118 lb 13.3 oz)   SpO2: 95% 92% 96% 96%   General: A&O x 3, NAD, pleasant, cooperative Cardiovascular: RRR, no rub, no gallop, no S3 Respiratory: Bilateral crackles. No wheezing Abdomen:soft, nontender, nondistended, positive bowel sounds Extremities: 1+LE edema, No lymphangitis, no petechiae  Discharge Instructions      Discharge Instructions    For home use only DME oxygen    Complete by:  As directed   4 L nasal canula, continuous  Mode or (Route):  Nasal cannula  Liters per Minute:  4  Frequency:  Continuous (stationary and portable oxygen unit needed)  Oxygen delivery system:  Gas            Medication List    STOP taking these medications        guaiFENesin-dextromethorphan 100-10 MG/5ML syrup  Commonly known as:  ROBITUSSIN DM     levofloxacin 750 MG tablet  Commonly known as:  LEVAQUIN     meloxicam 15 MG tablet  Commonly known as:  MOBIC     MUCINEX 600 MG 12 hr tablet  Generic drug:  guaiFENesin     OXYGEN     traMADol 50 MG tablet  Commonly known as:  ULTRAM      TAKE these medications        acetaminophen 650 MG CR tablet  Commonly known as:  TYLENOL  Take 650 mg by mouth 2 (two) times daily. x7 days. Started 03/23/15.     antiseptic oral rinse Liqd  15 mLs by Mouth Rinse route 4 (four) times daily as needed for dry mouth.     CALCIUM 600+D 600-400  MG-UNIT tablet  Generic drug:  Calcium Carbonate-Vitamin D  Take 1 tablet by mouth daily.     clonazePAM 0.5 MG tablet  Commonly known as:  KLONOPIN  Take 1 tablet (0.5 mg total) by mouth 2 (two) times daily as needed (anxiety).     donepezil 10 MG tablet  Commonly known as:  ARICEPT  Take 10 mg by mouth daily.     feeding supplement (ENSURE ENLIVE) Liqd  Take 237 mLs by mouth 2 (two) times daily between meals.     levothyroxine 100 MCG tablet  Commonly known as:  SYNTHROID, LEVOTHROID  Take 1 tablet (100 mcg total) by mouth daily before breakfast. For thyroid     omega-3 acid ethyl esters 1 G capsule  Commonly known as:  LOVAZA  Take 1 g by mouth daily. Fatty Acid one daily     ondansetron 4 MG tablet  Commonly known as:  ZOFRAN  Take 1 tablet (4 mg total) by mouth every 6 (six) hours as needed for nausea.     phenytoin 100 MG ER capsule  Commonly known as:  DILANTIN  Take 1 capsule by mouth in  the morning  and 2 capsules  by mouth at bedtime to prevent seizure.     saccharomyces boulardii 250 MG capsule  Commonly known as:  FLORASTOR  Take 250 mg by mouth 2 (two) times daily.     sertraline 50 MG tablet  Commonly known as:  ZOLOFT  One daily to improve anxiety and depression     TAB-A-VITE PO  Take 1 tablet by mouth daily.     Vitamin D-3 5000 UNITS Tabs  Take 5,000 Units by mouth daily.     warfarin 4 MG tablet  Commonly known as:  COUMADIN  Take 1 tablet (4 mg total) by mouth daily at 6 PM.         The results of significant diagnostics from this hospitalization (including imaging, microbiology, ancillary and laboratory) are listed below for reference.    Significant Diagnostic Studies: Dg Chest 2 View  03/28/2015  CLINICAL DATA:  Low oxygen.  Increased short of breath. EXAM: CHEST  2 VIEW COMPARISON:  CT 03/16/2015, chest radiograph 03/16/2015 FINDINGS: Normal cardiac silhouette. There is bilateral interstitial lung disease. There is slight increase in  density in the upper lobes suggesting superimposed infection or edema. No pneumothorax. IMPRESSION: Interstitial lung disease with superimposed mild pulmonary edema versus pneumonia. Electronically Signed   By: Suzy Bouchard M.D.   On: 03/28/2015 12:31   Dg Chest 2 View  03/11/2015  CLINICAL DATA:  Sepsis. Fever. Altered mental status. Urinary tract infection. EXAM: CHEST  2 VIEW COMPARISON:  Chest x-rays dated 07/25/2013 and 01/28/2011 and 01/22/2011 FINDINGS: Heart size and pulmonary vascularity are normal. The patient has developed patchy bilateral peripheral densities which are similar to those present in September 2012. No effusions. No acute osseous abnormality. IMPRESSION: Patchy bilateral pulmonary infiltrates. Findings are consistent with pneumonia/pneumonitis. Electronically Signed   By: Lorriane Shire M.D.   On: 03/11/2015 18:54   Ct Chest Wo Contrast  03/16/2015  CLINICAL DATA:  81 year old female with shortness of Breath, fever, altered mental status. Worsening lung opacity on chest radiographs. Initial encounter. EXAM: CT CHEST WITHOUT CONTRAST TECHNIQUE: Multidetector CT imaging of the chest was performed following the standard protocol without IV contrast. COMPARISON:  Portable chest 0939 hours today, and earlier. FINDINGS: Cardiomegaly. Small to moderate layering bilateral pleural effusions. No pericardial effusion. Calcified atherosclerosis of the aorta and coronary arteries. Tortuous thoracic aorta and proximal great vessels. Major airways are patent aside from atelectatic changes. Widespread and confluent pulmonary ground-glass opacity in the lungs right greater than left, with associated interstitial thickening. Areas of sparing also demonstrate attenuated vasculature suggestive of air trapping. This may reflect underlying emphysema. No areas of consolidation. Several calcified probable granulomas in the right upper and mid lung. Furthermore, there is fine reticulonodular  calcification at the lung bases which is evident on multiple prior chest x-rays, suggesting alveolar microlithiasis. Mild AP window and right peritracheal lymph node enlargement (up to 13 mm) appears reactive in nature. No axillary lymphadenopathy. Visualized noncontrast liver, spleen, pancreas, and bowel in the upper abdomen are within normal limits. There is thickening of the left adrenal gland probably due to adrenal hyperplasia. Grossly negative visualized left kidney. Diffuse spinal disc and endplate degeneration, severe in the lower thoracic levels with extensive vacuum phenomena. No acute osseous abnormality identified. IMPRESSION: 1. Scattered right greater than left pulmonary ground-glass opacity and some septal thickening superimposed on suspected chronic lung disease with emphysema and/or a degree of pulmonary fibrosis. Favor atypical infection, especially viral. 2. Small to moderate layering bilateral pleural effusions. Reactive  appearing mediastinal lymphadenopathy. 3. Cardiomegaly with no pericardial effusion. Electronically Signed   By: Genevie Ann M.D.   On: 03/16/2015 15:02   Ct Angio Chest Pe W/cm &/or Wo Cm  03/28/2015  CLINICAL DATA:  Shortness of Breath EXAM: CT ANGIOGRAPHY CHEST WITH CONTRAST TECHNIQUE: Multidetector CT imaging of the chest was performed using the standard protocol during bolus administration of intravenous contrast. Multiplanar CT image reconstructions and MIPs were obtained to evaluate the vascular anatomy. CONTRAST:  83m OMNIPAQUE IOHEXOL 350 MG/ML SOLN COMPARISON:  03/16/2015 FINDINGS: The lungs are well expanded bilaterally. There again noted diffuse chronic fibrotic changes bilaterally. The degree of chronic last infiltrate has improved in the interval. Multiple calcified granulomas are seen. The previously seen pleural effusions have also resolved in the interval. The thoracic inlet shows no acute abnormality. The thoracic aorta demonstrates a normal branching pattern.  Some calcific changes are seen without aneurysmal dilatation or signs of dissection. The coronary arteries demonstrate mild calcifications. The pulmonary artery is well visualized and demonstrates multiple filling defects throughout the lower lobes bilaterally consistent with pulmonary emboli. There is prominence of the right ventricle with evidence of right heart strain. The RV to LV ratio is 1.2. This is indicative of sub massive pulmonary embolism and right heart strain. No definitive filling defects are noted within the right ventricle. There are lymph node is again identified in the aorticopulmonary window as well as in the left suprahilar region stable from the prior exam. No new focal adenopathy is noted. The visualized upper abdomen is within normal limits. Degenerative changes of thoracic spine are again seen and stable. Review of the MIP images confirms the above findings. IMPRESSION: Positive for acute PE with CT evidence of right heart strain (RV/LV Ratio = 1.2) consistent with at least submassive (intermediate risk) PE. The presence of right heart strain has been associated with an increased risk of morbidity and mortality. Please activate Code PE by paging [phone] Chronic fibrotic changes in the lungs bilaterally. Otherwise stable appearance from the prior study. These results were called by telephone at the time of interpretation on 03/28/2015 at 1:57 pm to Dr. DDeno Etienne, who verbally acknowledged these results. Electronically Signed   By: MInez CatalinaM.D.   On: 03/28/2015 13:58   Ir Ivc Filter Plmt / S&i /img Guid/mod Sed  03/29/2015  INDICATION: 81year old female with acute sub massive pulmonary embolus. Lower extremity duplex ultrasound demonstrates mobile DVT in the left proximal femoral vein extending into the common femoral vein with additional occlusive thrombus in the bilateral calf veins. Given existing presence of right heart strain and freely mobile thrombus, temporary caval  interruption is warranted for dual prophylaxis. The patient is currently heparinized. EXAM: ULTRASOUND GUIDANCE FOR VASCULARACCESS IVC CATHETERIZATION AND VENOGRAM IVC FILTER INSERTION COMPARISON:  CTA chest 03/28/2015; duplex venous ultrasound 03/28/2015 MEDICATIONS: Fentanyl 50 mcg IV; Versed 1 mg IV ANESTHESIA/SEDATION: Sedation Time 15 minutes CONTRAST:  513mOMNIPAQUE IOHEXOL 300 MG/ML  SOLN FLUOROSCOPY TIME:  None COMPLICATIONS: None immediate PROCEDURE: Informed consent was obtained from the patient following explanation of the procedure, risks, benefits and alternatives. The patient understands, agrees and consents for the procedure. All questions were addressed. A time out was performed prior to the initiation of the procedure. Maximal barrier sterile technique utilized including caps, mask, sterile gowns, sterile gloves, large sterile drape, hand hygiene, and Betadine prep. Under sterile condition and local anesthesia, right internal jugular venous access was performed with ultrasound. An ultrasound image was saved and sent to  PACS. Over a guidewire, the IVC filter delivery sheath and inner dilator were advanced into the IVC just above the IVC bifurcation. Contrast injection was performed for an IVC venogram. Through the delivery sheath, a retrievable Denali IVC filter was deployed below the level of the renal veins and above the IVC bifurcation. Limited post deployment venacavagram was performed. The delivery sheath was removed and hemostasis was obtained with manual compression. A dressing was placed. The patient tolerated the procedure well without immediate post procedural complication. FINDINGS: The IVC is patent. No evidence of thrombus, stenosis, or occlusion. No variant venous anatomy. Successful placement of the IVC filter below the level of the renal veins. IMPRESSION: Successful ultrasound and fluoroscopically guided placement of an infrarenal retrievable IVC filter via right jugular approach.  This IVC filter is potentially retrievable. The patient will be assessed for filter retrieval by Interventional Radiology in approximately 8-12 weeks. Further recommendations regarding filter retrieval, continued surveillance or declaration of device permanence, will be made at that time. Signed, Criselda Peaches, MD [facility] Electronically Signed   By: Jacqulynn Cadet M.D.   On: 03/29/2015 14:52   Dg Chest Port 1 View  04/02/2015  CLINICAL DATA:  Acute respiratory failure EXAM: PORTABLE CHEST - 1 VIEW COMPARISON:  03/28/2015 FINDINGS: Cardiac shadow is stable. Patchy infiltrative changes are noted throughout both lungs and stable from the prior study. No new focal abnormality is seen. No bony abnormality is noted. IMPRESSION: No change from the prior exam. Electronically Signed   By: Inez Catalina M.D.   On: 04/02/2015 17:18   Dg Chest Port 1 View  03/16/2015  CLINICAL DATA:  Sob; patient also having right elbow pain, especially when abducting right arm; no injury EXAM: PORTABLE CHEST - 1 VIEW COMPARISON:  03/11/2015 FINDINGS: Coarse interstitial opacities throughout both lungs right greater than left, increased since previous exam. Some increase in bibasilar airspace opacities right greater than left, and new right suprahilar airspace disease. No pneumothorax. Blunting of lateral costophrenic angles suggesting small effusions. Spondylitic changes in the thoracolumbar spine a lumbar dextroscoliosis. IMPRESSION: 1. Worsening asymmetric interstitial and airspace edema or infiltrates, right worse than left. Electronically Signed   By: Lucrezia Europe M.D.   On: 03/16/2015 10:23   Dg Humerus Right  03/16/2015  CLINICAL DATA:  Sob; patient also having right elbow pain, especially when abducting right arm; no injury EXAM: RIGHT HUMERUS - 2+ VIEW COMPARISON:  None. FINDINGS: There is no evidence of fracture or other focal bone lesions. Soft tissues  are unremarkable. Small acromioclavicular spurs. IMPRESSION: Negative humerus. Electronically Signed   By: Lucrezia Europe M.D.   On: 03/16/2015 10:24     Microbiology: Recent Results (from the past 240 hour(s))  MRSA PCR Screening     Status: None   Collection Time: 03/28/15  8:57 PM  Result Value Ref Range Status   MRSA by PCR NEGATIVE NEGATIVE Final    Comment:        The GeneXpert MRSA Assay (FDA approved for NASAL specimens only), is one component of a comprehensive MRSA colonization surveillance program. It is not intended to diagnose MRSA infection nor to guide or monitor treatment for MRSA infections.      Labs: Basic Metabolic Panel:  Recent Labs Lab 03/29/15 0232 03/29/15 1045 03/30/15 0505 03/31/15 0245 04/02/15 0553 04/03/15 0655  NA 137 140 142 140 139 141  K 4.3 3.9 3.9 4.2 4.1 3.9  CL 104 105 104 103 103 103  CO2 '24 29 30 28 26 31  '$ GLUCOSE 159* 95 83 90 105* 103*  BUN '15 13 10 9 11 10  '$ CREATININE 0.64 0.61 0.56 0.55 0.64 0.58  CALCIUM 8.3* 8.6* 8.8* 8.8* 8.8* 8.9  MG 2.0  --   --   --  1.9  --   PHOS 4.2  --   --   --   --   --    Liver Function Tests:  Recent Labs Lab 03/29/15 1045  AST 33  ALT 25  ALKPHOS 75  BILITOT 0.1*  PROT 6.2*  ALBUMIN 2.3*   No results for input(s): LIPASE, AMYLASE in the last 168 hours. No results for input(s): AMMONIA in the last 168 hours. CBC:  Recent Labs Lab 03/28/15 1138  03/29/15 0232 03/30/15 0505 03/31/15 0245 04/01/15 0536 04/02/15 0553  WBC 12.3*  < > 9.4 8.7 9.0 8.3 7.6  NEUTROABS 8.3*  --   --   --   --   --   --   HGB 12.7  < > 10.9* 11.0* 11.0* 11.3* 10.8*  HCT 38.8  < > 34.1* 35.0* 35.1* 35.7* 33.7*  MCV 98.7  < > 97.7 99.2 98.3 98.3 99.7  PLT 255  < > 203 239 260 249 233  < > = values in this interval not displayed. Cardiac Enzymes:  Recent Labs Lab 03/28/15 1452 03/28/15 2338  TROPONINI 0.54* 0.44*   BNP: Invalid input(s): POCBNP CBG:  Recent Labs Lab 03/28/15 2036  04/01/15 0809  GLUCAP 116* 108*    Time coordinating discharge:  Greater than 30 minutes  Signed:  TAT, [name], DO [facility] Pager: [phone] 04/03/2015, 5:27 PM

## 2015-04-02 NOTE — Progress Notes (Signed)
ANTICOAGULATION CONSULT NOTE - Follow Up Consult  Pharmacy Consult for Heparin Indication: pulmonary embolus, s/p IVC filter placement on 11/10  Allergies  Allergen Reactions  . Morphine And Related Other (See Comments)    Altered mental state   . Sulfa Antibiotics Other (See Comments)    Reaction: unknown    Patient Measurements: Height: '5\' 5"'$  (165.1 cm) Weight: 118 lb 6.2 oz (53.7 kg) IBW/kg (Calculated) : 57  Vital Signs: Temp: 100.2 F (37.9 C) (11/14 0538) Temp Source: Oral (11/14 0538) BP: 117/54 mmHg (11/14 0538) Pulse Rate: 81 (11/14 0538)  Labs:  Recent Labs  03/31/15 0245 03/31/15 1138 04/01/15 0536 04/01/15 1817 04/02/15 0553  HGB 11.0*  --  11.3*  --  10.8*  HCT 35.1*  --  35.7*  --  33.7*  PLT 260  --  249  --  233  LABPROT 15.8*  --  16.2*  --  21.5*  INR 1.25  --  1.29  --  1.87*  HEPARINUNFRC 0.25* 0.49  --  0.31 0.35  CREATININE 0.55  --   --   --  0.64    Estimated Creatinine Clearance: 43.6 mL/min (by C-G formula based on Cr of 0.64).  Assessment: 81 y/o female on D#4 heparin to bridge warfarin for DVT/PE. Heparin level is therapeutic at 0.35 on 1150 units/hr. INR 1.29 > 1.87. CBC stable, no bleeding noted.   Goal of Therapy:  Heparin level 0.3-0.7 units/ml Monitor platelets by anticoagulation protocol: Yes  Plan:  Coumadin 2 mg po x 1 Continue heparin drip 1150 units/hr Daily heparin level and CBC Monitor for s/sx of bleeding  Maryanna Shape, PharmD, BCPS  Clinical Pharmacist  Pager: [phone]   04/02/2015 11:15 AM

## 2015-04-02 NOTE — Care Management Note (Signed)
Case Management Note  Patient Details  Name: Rebecca [name] MRN: [id number] Date of Birth: November 17, 1927  Subjective/Objective:  Patient is from [facility] SNF, per CSW note, plan is for patient to return, CSW following.                    Action/Plan:   Expected Discharge Date:                  Expected Discharge Plan:  Skilled Nursing Facility  In-House Referral:  Clinical Social Work  Discharge planning Services  CM Consult  Post Acute Care Choice:    Choice offered to:     DME Arranged:    DME Agency:     HH Arranged:    [place] Agency:     Status of Service:  Completed, signed off  Medicare Important Message Given:    Date Medicare IM Given:    Medicare IM give by:    Date Additional Medicare IM Given:    Additional Medicare Important Message give by:     If discussed at [place] of Stay Meetings, dates discussed:    Additional Comments:  Zenon Mayo, RN 04/02/2015, 3:15 PM

## 2015-04-02 NOTE — Discharge Instructions (Signed)

## 2015-04-02 NOTE — Progress Notes (Signed)
Initial Nutrition Assessment  DOCUMENTATION CODES:   Severe malnutrition in context of acute illness/injury  INTERVENTION:   -Ensure Enlive po BID, each supplement provides 350 kcal and 20 grams of protein  NUTRITION DIAGNOSIS:   Malnutrition related to acute illness as evidenced by moderate depletion of body fat, moderate depletions of muscle mass, percent weight loss.  GOAL:   Patient will meet greater than or equal to 90% of their needs  MONITOR:   PO intake, Supplement acceptance, Labs, Weight trends, Skin, I & O's  REASON FOR ASSESSMENT:   Consult Poor PO  ASSESSMENT:   81 y.o. Female with discharge from hospital on 03/19/15 after being admitted on 03/11/15 with sepsis secondary to pneumonia. Patient reportedly was on Lovenox for DVT prophylaxis during that admission. Prior to that she was living in an assisted living and was discharged to a skilled nursing facility. She had worsening hypoxia & dyspnea at the nursing facility and was sent to the E.D. for further workup.  Pt admitted with pulmonary emboli with rt heart strain. She is a resident of [facility].   Pt s/p IVC filter placement on 03/29/15.   Pt pleasantly confused; she is able to provide minimal hx. She confirms poor appetite, however, is unsure how long this has been occuring. She reveals that she just doesn't feel like eating. Meal completion 25-40%.   She is unsure if she has lost weight. She reports UBW is around 128#, which is fairly consistent with wt hx (UBW approximately 130#). Noted a 9.9% wt loss over the past 3 months, which is significant.   She denies difficulty chewing or swallowing foods, but prefers drinking beverages such as Diet Coke. She has tried Ensure in the past and is amenable to try after RD explained the importance of good nutritional intake for healing.   Nutrition-Focused physical exam completed. Findings are mild to moderate fat depletion, mild to moderate muscle depletion,  and no edema.   CSW following. Plan to discharge to [facility] SNF once medically stable.   Labs reviewed.    Diet Order:  Diet regular Room service appropriate?: Yes; Fluid consistency:: Thin  Skin:  Reviewed, no issues  Last BM:  03/31/15  Height:   Ht Readings from Last 1 Encounters:  03/31/15 '5\' 5"'$  (1.651 m)    Weight:   Wt Readings from Last 1 Encounters:  04/02/15 118 lb 6.2 oz (53.7 kg)    Ideal Body Weight:     BMI:  Body mass index is 19.7 kg/(m^2).  Estimated Nutritional Needs:   Kcal:  1400-1600  Protein:  65-75 grams  Fluid:  1.4-1.6 L  EDUCATION NEEDS:   Education needs addressed  Preethi Scantlebury A. Jimmye Norman, RD, LDN, CDE Pager: [phone] After hours Pager: [phone]

## 2015-04-02 NOTE — Progress Notes (Signed)
PROGRESS NOTE  Rebecca [name] PQZ:[id number] DOB: 1927/06/04 DOA: 03/28/2015 PCP: Estill Dooms, MD  Brief History 81 year old female with a history of seizures, hypothyroidism, diverticulosis, depression presented with dyspnea, hypoxemia, and hypotension at her nursing facility. with discharge from hospital on 03/19/15 after being admitted on 03/11/15 with sepsis secondary to pneumonia. Patient reportedly was on Lovenox for DVT prophylaxis during that admission. Prior to that she was living in an assisted living and was discharged to a skilled nursing facility. At baseline, the patient has cognitive impairment. The patient was initially placed on a nonrebreather to maintain her oxygenation. CT angiogram of the chest was obtained as part of the initial workup and revealed bilateral pulmonary emboli with indication of right heart strain on the CT. Transthoracic echocardiogram revealed largely normal left ventricular, aortic valve, and mitral valve function. Her right ventricle is mildly dilated with an elevated pulmonary artery systolic pressure (52 mmHg) consistent with her acute pulmonary emboli. TTE also showed mild LVH. EF 60-65%. No wall motion abnormalities. Grade 1 diastolic dysfunction. The patient was admitted to ICU and started on a heparin drip. Venous duplex of the lower extremities revealed a DVT in the left proximal femoral vein and DVT in the bilateral posterior temporal, peroneal, and soleal veins. On 03/29/2015, infrarenal IVC filter was placed by IR. The patient's troponins were elevated, but this was thought to be due to the patient's pulmonary emboli. The patient was fluid resuscitated with improvement of her blood pressure.  Assessment/Plan: Acute respiratory failure with hypoxemia  -Secondary to pulmonary emboli -Initially placed on heparin drip--continue heparin bridge -Plan to transition to factor X inhibitor -presently stable on 4L with saturation 94-95% -wean  oxygen to keep oxygen sat >92% -maintain negative fluid balance -CXR -daily weights Pulmonary emboli with DVT  -Initially placed on heparin  -warfarin started on 03/30/15 with heparin bridge -03/28/2015 TTE--EF 60-65%. No wall motion abnormalities. Grade 1 diastolic dysfunction. LA normal in size. RA mildly dilated. RV mildly dilated with preserved systolic function. PASP 43mHg. No aortic stenosis or regurg Cor pulmonale -Secondary to pulmonary emboli -Hemodynamically stable presently Elevated troponins -Likely secondary to pulmonary emboli Hypothyroidism  -Continue Synthroid  Cognitive impairment  -At risk for sundowning and delirium  -Continue Aricept and Zoloft -Monitor for signs of benzodiazepine withdrawal Seizure disorder  -No seizures in many years  -Continue Dilantin Severe Protein Calorie Malnutrition -appreciated nutrition -start ensure  Family Communication: Pt at beside Disposition Plan: SNF on 04/04/15 if stable      Procedures/Studies: Dg Chest 2 View  03/28/2015  CLINICAL DATA:  Low oxygen.  Increased short of breath. EXAM: CHEST  2 VIEW COMPARISON:  CT 03/16/2015, chest radiograph 03/16/2015 FINDINGS: Normal cardiac silhouette. There is bilateral interstitial lung disease. There is slight increase in density in the upper lobes suggesting superimposed infection or edema. No pneumothorax. IMPRESSION: Interstitial lung disease with superimposed mild pulmonary edema versus pneumonia. Electronically Signed   By: SSuzy BouchardM.D.   On: 03/28/2015 12:31   Dg Chest 2 View  03/11/2015  CLINICAL DATA:  Sepsis. Fever. Altered mental status. Urinary tract infection. EXAM: CHEST  2 VIEW COMPARISON:  Chest x-rays dated 07/25/2013 and 01/28/2011 and 01/22/2011 FINDINGS: Heart size and pulmonary vascularity are normal. The patient has developed patchy bilateral peripheral densities which are similar to those present in September 2012. No effusions. No acute  osseous abnormality. IMPRESSION: Patchy bilateral pulmonary infiltrates. Findings are consistent with pneumonia/pneumonitis. Electronically Signed  By: Lorriane Shire M.D.   On: 03/11/2015 18:54   Ct Chest Wo Contrast  03/16/2015  CLINICAL DATA:  81 year old female with shortness of Breath, fever, altered mental status. Worsening lung opacity on chest radiographs. Initial encounter. EXAM: CT CHEST WITHOUT CONTRAST TECHNIQUE: Multidetector CT imaging of the chest was performed following the standard protocol without IV contrast. COMPARISON:  Portable chest 0939 hours today, and earlier. FINDINGS: Cardiomegaly. Small to moderate layering bilateral pleural effusions. No pericardial effusion. Calcified atherosclerosis of the aorta and coronary arteries. Tortuous thoracic aorta and proximal great vessels. Major airways are patent aside from atelectatic changes. Widespread and confluent pulmonary ground-glass opacity in the lungs right greater than left, with associated interstitial thickening. Areas of sparing also demonstrate attenuated vasculature suggestive of air trapping. This may reflect underlying emphysema. No areas of consolidation. Several calcified probable granulomas in the right upper and mid lung. Furthermore, there is fine reticulonodular calcification at the lung bases which is evident on multiple prior chest x-rays, suggesting alveolar microlithiasis. Mild AP window and right peritracheal lymph node enlargement (up to 13 mm) appears reactive in nature. No axillary lymphadenopathy. Visualized noncontrast liver, spleen, pancreas, and bowel in the upper abdomen are within normal limits. There is thickening of the left adrenal gland probably due to adrenal hyperplasia. Grossly negative visualized left kidney. Diffuse spinal disc and endplate degeneration, severe in the lower thoracic levels with extensive vacuum phenomena. No acute osseous abnormality identified. IMPRESSION: 1. Scattered right greater  than left pulmonary ground-glass opacity and some septal thickening superimposed on suspected chronic lung disease with emphysema and/or a degree of pulmonary fibrosis. Favor atypical infection, especially viral. 2. Small to moderate layering bilateral pleural effusions. Reactive appearing mediastinal lymphadenopathy. 3. Cardiomegaly with no pericardial effusion. Electronically Signed   By: Genevie Ann M.D.   On: 03/16/2015 15:02   Ct Angio Chest Pe W/cm &/or Wo Cm  03/28/2015  CLINICAL DATA:  Shortness of Breath EXAM: CT ANGIOGRAPHY CHEST WITH CONTRAST TECHNIQUE: Multidetector CT imaging of the chest was performed using the standard protocol during bolus administration of intravenous contrast. Multiplanar CT image reconstructions and MIPs were obtained to evaluate the vascular anatomy. CONTRAST:  40m OMNIPAQUE IOHEXOL 350 MG/ML SOLN COMPARISON:  03/16/2015 FINDINGS: The lungs are well expanded bilaterally. There again noted diffuse chronic fibrotic changes bilaterally. The degree of chronic last infiltrate has improved in the interval. Multiple calcified granulomas are seen. The previously seen pleural effusions have also resolved in the interval. The thoracic inlet shows no acute abnormality. The thoracic aorta demonstrates a normal branching pattern. Some calcific changes are seen without aneurysmal dilatation or signs of dissection. The coronary arteries demonstrate mild calcifications. The pulmonary artery is well visualized and demonstrates multiple filling defects throughout the lower lobes bilaterally consistent with pulmonary emboli. There is prominence of the right ventricle with evidence of right heart strain. The RV to LV ratio is 1.2. This is indicative of sub massive pulmonary embolism and right heart strain. No definitive filling defects are noted within the right ventricle. There are lymph node is again identified in the aorticopulmonary window as well as in the left suprahilar region stable from the  prior exam. No new focal adenopathy is noted. The visualized upper abdomen is within normal limits. Degenerative changes of thoracic spine are again seen and stable. Review of the MIP images confirms the above findings. IMPRESSION: Positive for acute PE with CT evidence of right heart strain (RV/LV Ratio = 1.2) consistent with at least submassive (intermediate risk)  PE. The presence of right heart strain has been associated with an increased risk of morbidity and mortality. Please activate Code PE by paging [phone]. Chronic fibrotic changes in the lungs bilaterally. Otherwise stable appearance from the prior study. These results were called by telephone at the time of interpretation on 03/28/2015 at 1:57 pm to Dr. Deno Etienne , who verbally acknowledged these results. Electronically Signed   By: Inez Catalina M.D.   On: 03/28/2015 13:58   Ir Ivc Filter Plmt / S&i /img Guid/mod Sed  03/29/2015  INDICATION: 81 year old female with acute sub massive pulmonary embolus. Lower extremity duplex ultrasound demonstrates mobile DVT in the left proximal femoral vein extending into the common femoral vein with additional occlusive thrombus in the bilateral calf veins. Given existing presence of right heart strain and freely mobile thrombus, temporary caval interruption is warranted for dual prophylaxis. The patient is currently heparinized. EXAM: ULTRASOUND GUIDANCE FOR VASCULARACCESS IVC CATHETERIZATION AND VENOGRAM IVC FILTER INSERTION COMPARISON:  CTA chest 03/28/2015; duplex venous ultrasound 03/28/2015 MEDICATIONS: Fentanyl 50 mcg IV; Versed 1 mg IV ANESTHESIA/SEDATION: Sedation Time 15 minutes CONTRAST:  52m OMNIPAQUE IOHEXOL 300 MG/ML  SOLN FLUOROSCOPY TIME:  None COMPLICATIONS: None immediate PROCEDURE: Informed consent was obtained from the patient following explanation of the procedure, risks, benefits and alternatives. The patient understands, agrees and consents for the procedure. All questions were addressed.  A time out was performed prior to the initiation of the procedure. Maximal barrier sterile technique utilized including caps, mask, sterile gowns, sterile gloves, large sterile drape, hand hygiene, and Betadine prep. Under sterile condition and local anesthesia, right internal jugular venous access was performed with ultrasound. An ultrasound image was saved and sent to PACS. Over a guidewire, the IVC filter delivery sheath and inner dilator were advanced into the IVC just above the IVC bifurcation. Contrast injection was performed for an IVC venogram. Through the delivery sheath, a retrievable Denali IVC filter was deployed below the level of the renal veins and above the IVC bifurcation. Limited post deployment venacavagram was performed. The delivery sheath was removed and hemostasis was obtained with manual compression. A dressing was placed. The patient tolerated the procedure well without immediate post procedural complication. FINDINGS: The IVC is patent. No evidence of thrombus, stenosis, or occlusion. No variant venous anatomy. Successful placement of the IVC filter below the level of the renal veins. IMPRESSION: Successful ultrasound and fluoroscopically guided placement of an infrarenal retrievable IVC filter via right jugular approach. This IVC filter is potentially retrievable. The patient will be assessed for filter retrieval by Interventional Radiology in approximately 8-12 weeks. Further recommendations regarding filter retrieval, continued surveillance or declaration of device permanence, will be made at that time. Signed, HCriselda Peaches MD [facility] Electronically Signed   By: HJacqulynn CadetM.D.   On: 03/29/2015 14:52   Dg Chest Port 1 View  03/16/2015  CLINICAL DATA:  Sob; patient also having right elbow pain, especially when abducting right arm; no injury EXAM: PORTABLE CHEST - 1 VIEW COMPARISON:  03/11/2015 FINDINGS:  Coarse interstitial opacities throughout both lungs right greater than left, increased since previous exam. Some increase in bibasilar airspace opacities right greater than left, and new right suprahilar airspace disease. No pneumothorax. Blunting of lateral costophrenic angles suggesting small effusions. Spondylitic changes in the thoracolumbar spine a lumbar dextroscoliosis. IMPRESSION: 1. Worsening asymmetric interstitial and airspace edema or infiltrates, right worse than left. Electronically Signed   By: DEden EmmsD.  On: 03/16/2015 10:23   Dg Humerus Right  03/16/2015  CLINICAL DATA:  Sob; patient also having right elbow pain, especially when abducting right arm; no injury EXAM: RIGHT HUMERUS - 2+ VIEW COMPARISON:  None. FINDINGS: There is no evidence of fracture or other focal bone lesions. Soft tissues are unremarkable. Small acromioclavicular spurs. IMPRESSION: Negative humerus. Electronically Signed   By: Lucrezia Europe M.D.   On: 03/16/2015 10:24         Subjective: Patient denies fevers, chills, headache, chest pain, dyspnea, nausea, vomiting, diarrhea, abdominal pain, dysuria, hematuria.  No hematochezia, melena, dizziness   Objective: Filed Vitals:   04/01/15 2143 04/02/15 0538 04/02/15 0539 04/02/15 1514  BP: 126/59 117/54  116/56  Pulse: 79 81  74  Temp: 98.4 F (36.9 C) 100.2 F (37.9 C)  99.2 F (37.3 C)  TempSrc: Oral Oral  Oral  Resp: '20 18  17  '$ Height:      Weight:   53.7 kg (118 lb 6.2 oz)   SpO2: 94%   95%    Intake/Output Summary (Last 24 hours) at 04/02/15 1636 Last data filed at 04/02/15 0040  Gross per 24 hour  Intake      0 ml  Output    500 ml  Net   -500 ml   Weight change: -0.6 kg (-1 lb 5.2 oz) Exam:   General:  Pt is alert, follows commands appropriately, not in acute distress  HEENT: No icterus, No thrush, No neck mass, [place]/AT  Cardiovascular: RRR, S1/S2, no rubs, no gallops  Respiratory: Bilateral crackles. No wheezing. Good air  movement.  Abdomen: Soft/+BS, non tender, non distended, no guarding; no hepatosplenomegaly  Extremities: 1 + LE edema, No lymphangitis, No petechiae, No rashes, no synovitis and no cyanosis or clubbing;   Data Reviewed: Basic Metabolic Panel:  Recent Labs Lab 03/29/15 0232 03/29/15 1045 03/30/15 0505 03/31/15 0245 04/02/15 0553  NA 137 140 142 140 139  K 4.3 3.9 3.9 4.2 4.1  CL 104 105 104 103 103  CO2 '24 29 30 28 26  '$ GLUCOSE 159* 95 83 90 105*  BUN '15 13 10 9 11  '$ CREATININE 0.64 0.61 0.56 0.55 0.64  CALCIUM 8.3* 8.6* 8.8* 8.8* 8.8*  MG 2.0  --   --   --  1.9  PHOS 4.2  --   --   --   --    Liver Function Tests:  Recent Labs Lab 03/29/15 1045  AST 33  ALT 25  ALKPHOS 75  BILITOT 0.1*  PROT 6.2*  ALBUMIN 2.3*   No results for input(s): LIPASE, AMYLASE in the last 168 hours. No results for input(s): AMMONIA in the last 168 hours. CBC:  Recent Labs Lab 03/28/15 1138  03/29/15 0232 03/30/15 0505 03/31/15 0245 04/01/15 0536 04/02/15 0553  WBC 12.3*  < > 9.4 8.7 9.0 8.3 7.6  NEUTROABS 8.3*  --   --   --   --   --   --   HGB 12.7  < > 10.9* 11.0* 11.0* 11.3* 10.8*  HCT 38.8  < > 34.1* 35.0* 35.1* 35.7* 33.7*  MCV 98.7  < > 97.7 99.2 98.3 98.3 99.7  PLT 255  < > 203 239 260 249 233  < > = values in this interval not displayed. Cardiac Enzymes:  Recent Labs Lab 03/28/15 1452 03/28/15 2338  TROPONINI 0.54* 0.44*   BNP: Invalid input(s): POCBNP CBG:  Recent Labs Lab 03/28/15 2036 04/01/15 0809  GLUCAP 116* 108*  Recent Results (from the past 240 hour(s))  MRSA PCR Screening     Status: None   Collection Time: 03/28/15  8:57 PM  Result Value Ref Range Status   MRSA by PCR NEGATIVE NEGATIVE Final    Comment:        The GeneXpert MRSA Assay (FDA approved for NASAL specimens only), is one component of a comprehensive MRSA colonization surveillance program. It is not intended to diagnose MRSA infection nor to guide or monitor treatment  for MRSA infections.      Scheduled Meds: . donepezil  10 mg Oral Daily  . feeding supplement (ENSURE ENLIVE)  237 mL Oral BID BM  . levothyroxine  100 mcg Oral QAC breakfast  . phenytoin  100 mg Oral q morning - 10a  . phenytoin  200 mg Oral QHS  . sertraline  25 mg Oral Daily  . warfarin  2 mg Oral ONCE-1800  . Warfarin - Pharmacist Dosing Inpatient   Does not apply q1800   Continuous Infusions: . heparin 1,150 Units/hr (04/01/15 1920)     Jace Fermin, DO  [facility] Pager [phone]  If 7PM-7AM, please contact night-coverage [email or web address] Password [email or web address] 04/02/2015, 4:36 PM   LOS: 5 days

## 2015-04-02 NOTE — Care Management Important Message (Signed)
Important Message  Patient Details  Name: Rebecca [name] MRN: [id number] Date of Birth: Dec 19, 1927   Medicare Important Message Given:  Yes    Jeany Seville P [place] 04/02/2015, 3:16 PM

## 2015-04-03 DIAGNOSIS — I82419 Acute embolism and thrombosis of unspecified femoral vein: Secondary | ICD-10-CM

## 2015-04-03 LAB — HEPARIN LEVEL (UNFRACTIONATED): Heparin Unfractionated: 0.28 IU/mL — ABNORMAL LOW (ref 0.30–0.70)

## 2015-04-03 LAB — BASIC METABOLIC PANEL
Anion gap: 7 (ref 5–15)
BUN: 10 mg/dL (ref 6–20)
CHLORIDE: 103 mmol/L (ref 101–111)
CO2: 31 mmol/L (ref 22–32)
CREATININE: 0.58 mg/dL (ref 0.44–1.00)
Calcium: 8.9 mg/dL (ref 8.9–10.3)
GFR calc non Af Amer: >60 mL/min (ref >60)
GLUCOSE: 103 mg/dL — AB (ref 65–99)
Potassium: 3.9 mmol/L (ref 3.5–5.1)
Sodium: 141 mmol/L (ref 135–145)

## 2015-04-03 LAB — PROTIME-INR
INR: 1.95 — AB (ref 0.00–1.49)
Prothrombin Time: 22.1 seconds — ABNORMAL HIGH (ref 11.6–15.2)

## 2015-04-03 MED ORDER — WARFARIN SODIUM 4 MG PO TABS
4.0000 mg | ORAL_TABLET | Freq: Every day | ORAL | Status: DC
  Administered 2015-04-03: 4 mg via ORAL
  Filled 2015-04-03 (×2): qty 1

## 2015-04-03 MED ORDER — WARFARIN SODIUM 4 MG PO TABS: 4.0000 mg | ORAL_TABLET | Freq: Every day | ORAL | Status: DC

## 2015-04-03 NOTE — Progress Notes (Signed)
Per MD- plan d/c to [facility]- SNF level tomorrow.  CSW spoke to [place]- Admissions at [facility]- Bed will be available for patient tomorrow.  Increased level of care to SNF from Assisted Living level.  Lorie Phenix. Pauline Good, [place]

## 2015-04-03 NOTE — Progress Notes (Signed)
ANTICOAGULATION CONSULT NOTE - Follow Up Consult  Pharmacy Consult for Heparin Indication: pulmonary embolus, s/p IVC filter placement on 11/10  Allergies  Allergen Reactions  . Morphine And Related Other (See Comments)    Altered mental state   . Sulfa Antibiotics Other (See Comments)    Reaction: unknown    Patient Measurements: Height: '5\' 5"'$  (165.1 cm) Weight: 118 lb 13.3 oz (53.9 kg) IBW/kg (Calculated) : 57  Vital Signs: Temp: 99.6 F (37.6 C) (11/15 0518) Temp Source: Oral (11/15 0518) BP: 125/60 mmHg (11/15 0518) Pulse Rate: 78 (11/15 0518)  Labs:  Recent Labs  04/01/15 0536 04/01/15 1817 04/02/15 0553 04/03/15 0655  HGB 11.3*  --  10.8*  --   HCT 35.7*  --  33.7*  --   PLT 249  --  233  --   LABPROT 16.2*  --  21.5* 22.1*  INR 1.29  --  1.87* 1.95*  HEPARINUNFRC  --  0.31 0.35 0.28*  CREATININE  --   --  0.64 0.58    Estimated Creatinine Clearance: 43.7 mL/min (by C-G formula based on Cr of 0.58).  Assessment: 81 y/o female on D#5 heparin to bridge warfarin for DVT/PE. Heparin level is therapeutic at 0.28 on 1150 units/hr. INR 1.95. CBC stable, no bleeding noted. Possible discharge to SNF tomorrow.  Goal of Therapy:  INR 2-3 Heparin level 0.3-0.7 units/ml Monitor platelets by anticoagulation protocol: Yes  Plan:  Coumadin 4 mg po daily Increase heparin drip slightly to 1200 units/hr Hopefully can d/c heparin if INR remains therapeutic tomorrow AM Daily PT/INR Monitor for s/sx of bleeding  Maryanna Shape, PharmD, BCPS  Clinical Pharmacist  Pager: [phone]   04/03/2015 8:54 AM

## 2015-04-04 DIAGNOSIS — I2601 Septic pulmonary embolism with acute cor pulmonale: Secondary | ICD-10-CM | POA: Diagnosis not present

## 2015-04-04 DIAGNOSIS — R41841 Cognitive communication deficit: Secondary | ICD-10-CM | POA: Diagnosis not present

## 2015-04-04 DIAGNOSIS — I480 Paroxysmal atrial fibrillation: Secondary | ICD-10-CM | POA: Diagnosis not present

## 2015-04-04 DIAGNOSIS — J9601 Acute respiratory failure with hypoxia: Secondary | ICD-10-CM | POA: Diagnosis not present

## 2015-04-04 DIAGNOSIS — R2681 Unsteadiness on feet: Secondary | ICD-10-CM | POA: Diagnosis not present

## 2015-04-04 DIAGNOSIS — F329 Major depressive disorder, single episode, unspecified: Secondary | ICD-10-CM | POA: Diagnosis not present

## 2015-04-04 DIAGNOSIS — R9431 Abnormal electrocardiogram [ECG] [EKG]: Secondary | ICD-10-CM | POA: Diagnosis not present

## 2015-04-04 DIAGNOSIS — I2609 Other pulmonary embolism with acute cor pulmonale: Secondary | ICD-10-CM | POA: Diagnosis not present

## 2015-04-04 DIAGNOSIS — E039 Hypothyroidism, unspecified: Secondary | ICD-10-CM | POA: Diagnosis not present

## 2015-04-04 DIAGNOSIS — R413 Other amnesia: Secondary | ICD-10-CM | POA: Diagnosis not present

## 2015-04-04 DIAGNOSIS — I82412 Acute embolism and thrombosis of left femoral vein: Secondary | ICD-10-CM | POA: Diagnosis not present

## 2015-04-04 DIAGNOSIS — G40909 Epilepsy, unspecified, not intractable, without status epilepticus: Secondary | ICD-10-CM | POA: Diagnosis not present

## 2015-04-04 DIAGNOSIS — I82411 Acute embolism and thrombosis of right femoral vein: Secondary | ICD-10-CM | POA: Diagnosis not present

## 2015-04-04 DIAGNOSIS — R531 Weakness: Secondary | ICD-10-CM | POA: Diagnosis not present

## 2015-04-04 DIAGNOSIS — I2699 Other pulmonary embolism without acute cor pulmonale: Secondary | ICD-10-CM | POA: Diagnosis not present

## 2015-04-04 DIAGNOSIS — J9600 Acute respiratory failure, unspecified whether with hypoxia or hypercapnia: Secondary | ICD-10-CM | POA: Diagnosis not present

## 2015-04-04 DIAGNOSIS — R262 Difficulty in walking, not elsewhere classified: Secondary | ICD-10-CM | POA: Diagnosis not present

## 2015-04-04 DIAGNOSIS — M6281 Muscle weakness (generalized): Secondary | ICD-10-CM | POA: Diagnosis not present

## 2015-04-04 DIAGNOSIS — I4891 Unspecified atrial fibrillation: Secondary | ICD-10-CM | POA: Diagnosis not present

## 2015-04-04 LAB — CBC
HCT: 35.9 % — ABNORMAL LOW (ref 36.0–46.0)
HEMOGLOBIN: 11.6 g/dL — AB (ref 12.0–15.0)
MCH: 32.1 pg (ref 26.0–34.0)
MCHC: 32.3 g/dL (ref 30.0–36.0)
MCV: 99.4 fL (ref 78.0–100.0)
Platelets: 255 10*3/uL (ref 150–400)
RBC: 3.61 MIL/uL — AB (ref 3.87–5.11)
RDW: 14 % (ref 11.5–15.5)
WBC: 8.6 10*3/uL (ref 4.0–10.5)

## 2015-04-04 LAB — PROTIME-INR
INR: 2.18 — AB (ref 0.00–1.49)
PROTHROMBIN TIME: 24.1 s — AB (ref 11.6–15.2)

## 2015-04-04 LAB — HEPARIN LEVEL (UNFRACTIONATED): Heparin Unfractionated: 0.65 IU/mL (ref 0.30–0.70)

## 2015-04-04 NOTE — Progress Notes (Signed)
OT Cancellation Note  Patient Details Name: Rebecca [name] MRN: [id number] DOB: Sep 21, 1927   Cancelled Treatment:    Reason Eval/Treat Not Completed: OT screened, no needs identified, will sign off (Defer to SNF). Plan is for patient to discharge to SNF. No acute OT needs identified, all needs can be met in SNF. Please send text page to OT services if any questions, concerns, or with new orders: [phone] OR call office at [phone]. Thank you for the order.   Lilian Fuhs , MS, OTR/L, CLT Pager: [phone]  04/04/2015, 12:19 PM

## 2015-04-04 NOTE — Progress Notes (Signed)
[facility] PROGRESS NOTE  Rebecca [name] YJE:[id number] DOB: 07-Mar-1928 DOA: 03/28/2015 PCP: Estill Dooms, MD  Please refer to discharge summary from 11/15 for details, in brief, 81 year old female with history of hypothyroidism, diverticulosis, depression and seizures presented with acute onset dyspnea shortness of breath and hypertension at the nursing facility. She was admitted to the hospital from 10/23-10/31 after sepsis secondary to pneumonia. As her family she was not participating with PT or ambulate pretty at the facility after being discharged. On presentation she required nonrebreather. CT angiogram of the chest showed bilateral pulmonary emboli with right heart strain on CT. 2-D echo showed mild dilated right ventricle with elevated pulmonary artery pressure with normal EF and no wall motion abnormality. Patient was admitted to ICU and started on heparin drip. Doppler of lower extremity showed left proximal femoral DVT and bilateral posterior temporal, peroneal and soleal vein DVT. She had an IVC filter placed on 11/10 by IR. Coumadin was initiated and patient transferred to hospitalist service.  Assessment/Plan: Acute respiratory failure with hypoxemia  -Secondary to pulmonary emboli -Initially placed on heparin drip--continue heparin bridge -Transitioned to coumadin -presently stable on 4L with saturation 94-95% -wean oxygen to keep oxygen sat >92% -maintain negative fluid balance -11/14--CXR unchanged bilateral infiltrates -daily weights--remained stable at 118 pounds  Pulmonary emboli with DVT  -Initially placed on IV heparin  -warfarin started on 03/30/15 with heparin bridge x 5 days. INR now therapeutic. Will be discharged on Coumadin 4 mg daily. Monitor INR closely. -03/28/2015 TTE--EF 60-65%. No wall motion abnormalities. Grade 1 diastolic dysfunction. LA normal in size. RA mildly dilated. RV mildly dilated with preserved systolic function. PASP 47mHg. No aortic  stenosis or regurgitation. -Patient will  need at least 6 months of anticoagulation and monitor with follow-up 2-D echo.    Cor pulmonale -Secondary to pulmonaryEmboli. Currently stable, requiring 4 L O2 via nasal cannula. Titrate oxygen as needed.  Elevated troponins -Likely secondary to pulmonary emboli  Hypothyroidism  -Continue Synthroid   Cognitive impairment  -At risk for sundowning and delirium  -Continue Aricept and Zoloft -Monitor for signs of benzodiazepine withdrawal  Seizure disorder  No episode in several years. -Continue Dilantin  Severe Protein Calorie Malnutrition -Continue ensure   Discharge Condition: Stable  Disposition:  skilled nursing facility  Diet: Regular  HPI/Subjective: Patient seen and examined. Son and daughter at bedside. Denies any specific symptom.  Objective: Filed Vitals:   04/04/15 0524  BP: 118/58  Pulse: 73  Temp: 97.4 F (36.3 C)  Resp: 18    Intake/Output Summary (Last 24 hours) at 04/04/15 1151 Last data filed at 04/04/15 1001  Gross per 24 hour  Intake 541.07 ml  Output    600 ml  Net -58.93 ml   Filed Weights   04/02/15 0539 04/03/15 0518 04/04/15 0524  Weight: 53.7 kg (118 lb 6.2 oz) 53.9 kg (118 lb 13.3 oz) 52.6 kg (115 lb 15.4 oz)    Exam:   General:   elderly female not in distress, appears fatigued  HEENT no pallor, moist oral mucosa  Chest: Clear to auscultation bilaterally  CVS: Normal S1 and S2, , No murmurs  GI: soft, NT, N,D BS+  Musculoskeletal: Warm, no edema  CNS: Alert and oriented  Data Reviewed: Basic Metabolic Panel:  Recent Labs Lab 03/29/15 0232 03/29/15 1045 03/30/15 0505 03/31/15 0245 04/02/15 0553 04/03/15 0655  NA 137 140 142 140 139 141  K 4.3 3.9 3.9 4.2 4.1 3.9  CL 104 105 104 103  103 103  CO2 '24 29 30 28 26 31  '$ GLUCOSE 159* 95 83 90 105* 103*  BUN '15 13 10 9 11 10  '$ CREATININE 0.64 0.61 0.56 0.55 0.64 0.58  CALCIUM 8.3* 8.6* 8.8* 8.8* 8.8* 8.9  MG 2.0   --   --   --  1.9  --   PHOS 4.2  --   --   --   --   --    Liver Function Tests:  Recent Labs Lab 03/29/15 1045  AST 33  ALT 25  ALKPHOS 75  BILITOT 0.1*  PROT 6.2*  ALBUMIN 2.3*   No results for input(s): LIPASE, AMYLASE in the last 168 hours. No results for input(s): AMMONIA in the last 168 hours. CBC:  Recent Labs Lab 03/30/15 0505 03/31/15 0245 04/01/15 0536 04/02/15 0553 04/04/15 0519  WBC 8.7 9.0 8.3 7.6 8.6  HGB 11.0* 11.0* 11.3* 10.8* 11.6*  HCT 35.0* 35.1* 35.7* 33.7* 35.9*  MCV 99.2 98.3 98.3 99.7 99.4  PLT 239 260 249 233 255   Cardiac Enzymes:  Recent Labs Lab 03/28/15 1452 03/28/15 2338  TROPONINI 0.54* 0.44*   BNP (last 3 results)  Recent Labs  03/28/15 1138  BNP 495.8*    ProBNP (last 3 results) No results for input(s): PROBNP in the last 8760 hours.  CBG:  Recent Labs Lab 03/28/15 2036 04/01/15 0809  GLUCAP 116* 108*    Recent Results (from the past 240 hour(s))  MRSA PCR Screening     Status: None   Collection Time: 03/28/15  8:57 PM  Result Value Ref Range Status   MRSA by PCR NEGATIVE NEGATIVE Final    Comment:        The GeneXpert MRSA Assay (FDA approved for NASAL specimens only), is one component of a comprehensive MRSA colonization surveillance program. It is not intended to diagnose MRSA infection nor to guide or monitor treatment for MRSA infections.      Studies: Dg Chest Port 1 View  04/02/2015  CLINICAL DATA:  Acute respiratory failure EXAM: PORTABLE CHEST - 1 VIEW COMPARISON:  03/28/2015 FINDINGS: Cardiac shadow is stable. Patchy infiltrative changes are noted throughout both lungs and stable from the prior study. No new focal abnormality is seen. No bony abnormality is noted. IMPRESSION: No change from the prior exam. Electronically Signed   By: Inez Catalina M.D.   On: 04/02/2015 17:18    Scheduled Meds: . donepezil  10 mg Oral Daily  . feeding supplement (ENSURE ENLIVE)  237 mL Oral BID BM  .  levothyroxine  100 mcg Oral QAC breakfast  . phenytoin  100 mg Oral q morning - 10a  . phenytoin  200 mg Oral QHS  . sertraline  25 mg Oral Daily  . warfarin  4 mg Oral q1800  . Warfarin - Pharmacist Dosing Inpatient   Does not apply q1800   Continuous Infusions:     Time spent: 25 minutes    Louellen Molder  [facility] Pager [phone] If 7PM-7AM, please contact night-coverage at [email or web address], password [facility] 04/04/2015, 11:51 AM  LOS: 7 days

## 2015-04-04 NOTE — Progress Notes (Signed)
Patient will discharge to [facility] Anticipated discharge date:04/04/15 Family notified: pt son Transportation byPTAR- scheduled for 3:30pm  CSW signing off.  Domenica Reamer, [place] Social Worker [phone]

## 2015-04-04 NOTE — Evaluation (Addendum)
Physical Therapy Evaluation Patient Details Name: Rebecca [name] MRN: [id number] DOB: 05/03/1928 Today's Date: 04/04/2015   History of Present Illness  pt is an 81 y/o female with recent admission for PNA, d/c'd to SNF for rehab.   This time admitted form SNF due to worsening confusion and hypoxia.  Angio shows bil PE and dopplers show L LE DVT.  11/10 IVC filter placed.  PMH: osteoporosis seizures and DJD  Clinical Impression  Pt admitted with/for Bil PE with R heart stress.  On initial eval pt's SpO2 significantly compromised.   Pt currently limited functionally due to the problems listed. ( See problems list.)   Pt will benefit from PT to maximize function and safety in order to get ready for next venue listed below.      Follow Up Recommendations SNF;Supervision/Assistance - 24 hour    Equipment Recommendations  None recommended by PT    Recommendations for Other Services       Precautions / Restrictions Precautions Precautions: Fall      Mobility  Bed Mobility Overal bed mobility: Needs Assistance Bed Mobility: Supine to Sit     Supine to sit: Min assist     General bed mobility comments: initial assist then patient able to scoot herselft  Transfers Overall transfer level: Needs assistance Equipment used: 1 person hand held assist;Rolling walker (2 wheeled) Transfers: Sit to/from Stand Sit to Stand: Min guard         General transfer comment: cues for hand placement  Ambulation/Gait Ambulation/Gait assistance: Min guard;Min assist Ambulation Distance (Feet): 140 Feet Assistive device: Rolling walker (2 wheeled);1 person hand held assist Gait Pattern/deviations: Step-through pattern Gait velocity: slow   General Gait Details: pt too unsteady with AD so used RW for increase stability, but pt quickly fatigued even with RW.    Stairs            Wheelchair Mobility    Modified Rankin (Stroke Patients Only)       Balance Overall balance  assessment: Needs assistance Sitting-balance support: No upper extremity supported Sitting balance-Leahy Scale: Good     Standing balance support: No upper extremity supported;Single extremity supported Standing balance-Leahy Scale: Fair                               Pertinent Vitals/Pain Pain Assessment: Faces Faces Pain Scale: No hurt    Home Living Family/patient expects to be discharged to:: Skilled nursing facility                 Additional Comments: Has bee a resident of [place], but don't know her past PLOF    Prior Function Level of Independence: Independent               Hand Dominance        Extremity/Trunk Assessment   Upper Extremity Assessment: Generalized weakness           Lower Extremity Assessment: Generalized weakness      Cervical / Trunk Assessment: Kyphotic  Communication   Communication: No difficulties  Cognition Arousal/Alertness: Awake/alert Behavior During Therapy: WFL for tasks assessed/performed Overall Cognitive Status: Within Functional Limits for tasks assessed (h/o cognitive impairment) Area of Impairment: Orientation;Problem solving;Safety/judgement         Safety/Judgement: Decreased awareness of safety;Decreased awareness of deficits   Problem Solving: Slow processing      General Comments General comments (skin integrity, edema, etc.): After bed mobility, pt's sats   dropped mildly to 84-89% on 4L [place]; with HR 82 bpm.  After ambulation, sats on 4-6 L down to 72 %  EHR 89 bpm    Exercises        Assessment/Plan    PT Assessment All further PT needs can be met in the next venue of care  PT Diagnosis Difficulty walking;Generalized weakness   PT Problem List Decreased strength;Decreased activity tolerance;Decreased balance;Decreased mobility;Decreased safety awareness;Cardiopulmonary status limiting activity  PT Treatment Interventions DME instruction;Gait training;Functional mobility  training;Therapeutic activities;Balance training;Patient/family education   PT Goals (Current goals can be found in the Care Plan section) Acute Rehab PT Goals Patient Stated Goal: get back home PT Goal Formulation: Patient unable to participate in goal setting Time For Goal Achievement: 03/27/15 Potential to Achieve Goals: Good    Frequency Min 3X/week   Barriers to discharge        Co-evaluation               End of Session Equipment Utilized During Treatment: Oxygen Activity Tolerance: Patient limited by fatigue Patient left: in bed;with call bell/phone within reach Nurse Communication: Mobility status         Time: 1406-1430 PT Time Calculation (min) (ACUTE ONLY): 24 min   Charges:   PT Evaluation $Initial PT Evaluation Tier I: 1 Procedure PT Treatments $Gait Training: 8-22 mins   PT G Codes:        Rumeal Cullipher, Tessie Fass 04/04/2015, 3:03 PM 04/04/2015  Donnella Sham, PT [phone]  (pager)

## 2015-04-04 NOTE — Care Management Note (Signed)
Case Management Note  Patient Details  Name: Rebecca [name] MRN: [id number] Date of Birth: Sep 30, 1927  Subjective/Objective:      Patient is for dc to SNF back to [facility] today.  CSW following.              Action/Plan:   Expected Discharge Date:                  Expected Discharge Plan:  Skilled Nursing Facility  In-House Referral:  Clinical Social Work  Discharge planning Services  CM Consult  Post Acute Care Choice:    Choice offered to:     DME Arranged:    DME Agency:     HH Arranged:    [name] Agency:     Status of Service:  Completed, signed off  Medicare Important Message Given:  Yes Date Medicare IM Given:    Medicare IM give by:    Date Additional Medicare IM Given:    Additional Medicare Important Message give by:     If discussed at [place] of Stay Meetings, dates discussed:    Additional Comments:  Zenon Mayo, RN 04/04/2015, 10:50 AM

## 2015-04-04 NOTE — Progress Notes (Signed)
ANTICOAGULATION CONSULT NOTE - Follow Up Consult  Pharmacy Consult for Heparin Indication: pulmonary embolus, s/p IVC filter placement on 11/10  Allergies  Allergen Reactions  . Morphine And Related Other (See Comments)    Altered mental state   . Sulfa Antibiotics Other (See Comments)    Reaction: unknown    Patient Measurements: Height: '5\' 5"'$  (165.1 cm) Weight: 115 lb 15.4 oz (52.6 kg) IBW/kg (Calculated) : 57  Vital Signs: Temp: 97.4 F (36.3 C) (11/16 0524) Temp Source: Oral (11/16 0524) BP: 118/58 mmHg (11/16 0524) Pulse Rate: 73 (11/16 0524)  Labs:  Recent Labs  04/02/15 0553 04/03/15 0655 04/04/15 0519  HGB 10.8*  --  11.6*  HCT 33.7*  --  35.9*  PLT 233  --  255  LABPROT 21.5* 22.1* 24.1*  INR 1.87* 1.95* 2.18*  HEPARINUNFRC 0.35 0.28* 0.65  CREATININE 0.64 0.58  --     Estimated Creatinine Clearance: 42.7 mL/min (by C-G formula based on Cr of 0.58).  Assessment: 81 y/o female completed 5 day heparin/coumadin bridge for DVT/PE. INR 2.18 remains therapeutic. CBC stable, no bleeding noted. Plan for discharging to SNF.  Goal of Therapy:  INR 2-3 Monitor platelets by anticoagulation protocol: Yes  Plan:  Coumadin 4 mg po daily Daily PT/INR Monitor for s/sx of bleeding  Maryanna Shape, PharmD, BCPS  Clinical Pharmacist  Pager: [phone]   04/04/2015 11:08 AM

## 2015-04-04 NOTE — Progress Notes (Addendum)
Patient was discharged  to [facility]) by MD order; discharged instructions  review and sent to facility with care notes and prescriptions; IV DIC; skin intact; Facility was called and report was given to charge nurse. Patient will be transported to the facility via [place].

## 2015-04-05 LAB — BASIC METABOLIC PANEL
BUN: 18 mg/dL (ref 4–21)
CREATININE: 0.5 mg/dL (ref 0.5–1.1)
GLUCOSE: 107 mg/dL
POTASSIUM: 4.8 mmol/L (ref 3.4–5.3)
Sodium: 142 mmol/L (ref 137–147)

## 2015-04-06 ENCOUNTER — Encounter: Payer: Self-pay | Admitting: Nurse Practitioner

## 2015-04-06 ENCOUNTER — Non-Acute Institutional Stay (SKILLED_NURSING_FACILITY): Payer: Medicare Other | Admitting: Nurse Practitioner

## 2015-04-06 DIAGNOSIS — R413 Other amnesia: Secondary | ICD-10-CM

## 2015-04-06 DIAGNOSIS — J9601 Acute respiratory failure with hypoxia: Secondary | ICD-10-CM

## 2015-04-06 DIAGNOSIS — G40909 Epilepsy, unspecified, not intractable, without status epilepticus: Secondary | ICD-10-CM | POA: Diagnosis not present

## 2015-04-06 DIAGNOSIS — I2601 Septic pulmonary embolism with acute cor pulmonale: Secondary | ICD-10-CM

## 2015-04-06 DIAGNOSIS — I82411 Acute embolism and thrombosis of right femoral vein: Secondary | ICD-10-CM

## 2015-04-06 DIAGNOSIS — F329 Major depressive disorder, single episode, unspecified: Secondary | ICD-10-CM | POA: Diagnosis not present

## 2015-04-06 DIAGNOSIS — E039 Hypothyroidism, unspecified: Secondary | ICD-10-CM | POA: Diagnosis not present

## 2015-04-06 DIAGNOSIS — F32A Depression, unspecified: Secondary | ICD-10-CM

## 2015-04-06 NOTE — Assessment & Plan Note (Signed)
Continue Levothyroxine 121mg 01/18/15 TSH 1.723, 03/08/15 1.467

## 2015-04-06 NOTE — Assessment & Plan Note (Signed)
Venous duplex of the lower extremities revealed a DVT in the left proximal femoral vein and DVT in the bilateral posterior temporal, peroneal, and soleal veins. On 03/29/2015, infrarenal IVC filter was placed by IR.

## 2015-04-06 NOTE — Assessment & Plan Note (Signed)
-  Secondary to pulmonary emboli -Initially placed on heparin drip--continue heparin bridge -Transitioned to coumadin -presently stable on 4L with saturation 94-95% -wean oxygen to keep oxygen sat >92% -11/14--CXR unchanged bilateral infiltrates -daily weights--remained stable at 118 pounds

## 2015-04-06 NOTE — Assessment & Plan Note (Signed)
Sleeps well, mood is stable, continue Sertraline '50mg'$  daily, prn Clonazepam 0.'5mg'$  bid.

## 2015-04-06 NOTE — Progress Notes (Signed)
Patient ID: Rebecca [name], female   DOB: Nov 16, 1927, 81 y.o.   MRN: [id number]  Location:  SNF [facility] Provider:  Marlana Latus NP  Code Status:  DNR Goals of care: Advanced Directive information    Chief Complaint  Patient presents with  . Medical Management of Chronic Issues     HPI: Patient is a 81 y.o. female seen in the SNF at [facility] today for evaluation of s/p hospitalization 03/28/15 to 04/04/15 for bilateral pulmonary emboli. CT angiogram of the chest was obtained as part of the initial workup and revealed bilateral pulmonary emboli with indication of right heart strain on the CT. Transthoracic echocardiogram revealed largely normal left ventricular, aortic valve, and mitral valve function. Her right ventricle is mildly dilated with an elevated pulmonary artery systolic pressure (52 mmHg) consistent with her acute pulmonary emboli. TTE also showed mild LVH. EF 60-65%. No wall motion abnormalities. Grade 1 diastolic dysfunction.     S/p sepsis due to PNA, Hx of  Hypothyroidism, Memory loss, Depression, Seizure disorder ([place]), Dyslipidemia   Hospitalized from 03/11/2015-03/19/2015 for sepsis due to PNA. She presented to ED with T 102.9, Bp 80/64, RR 26, Sat O2 89%, and altered mentation. CXR showed possible bilateral pneumonia. She was started on vanco and zosyn for treatment of HCAP, completed Levaquin in SNF   Review of Systems  Constitutional: Positive for malaise/fatigue. Negative for diaphoresis.  HENT: Positive for hearing loss. Negative for congestion, ear discharge, ear pain, nosebleeds, sore throat and tinnitus.        Bilateral hearing loss. June 2015 ruptured right Tm with drainage, but little pain.  Eyes: Negative.   Respiratory:       History of dyspnea on exertion  Cardiovascular: Negative.   Gastrointestinal:       Blood noted in stool and toilet paper occasionally.  Genitourinary: Positive for frequency.       2-3x/night  Musculoskeletal:   Osteoarthritis with complaints of pain in multiple areas. Neck and right shoulder pains. Sharp pain in the right shoulder and arm when she abducts and rotated at the shoulder  Skin:       Senile ecchymoses  Neurological: Positive for dizziness and tremors. Negative for tingling, sensory change, speech change, focal weakness and seizures.       Hx seizures. C/o headache 10/10 right sided   Endo/Heme/Allergies: Negative for environmental allergies and polydipsia.  Psychiatric/Behavioral: Positive for suicidal ideas. The patient has insomnia.        Chronic anxiety. Memory deficits.    Past Medical History  Diagnosis Date  . Osteoporosis 02/2011  . Seizures ([place])     Remotely  . DJD (degenerative joint disease)   . Diverticulosis 02/2011  . Hemorrhoids 02/2011  . Dizziness   . Low back pain   . Mitral valve problem thickened    thickened  . Unspecified hypothyroidism 01/2011  . 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 Vitamin D deficiency 01/2011  . Anemia, unspecified 03/18/2011  . Anxiety state, unspecified 01/2011  . 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 Pain in joint, site unspecified 01/2012  . Sacroiliitis, not elsewhere classified ([place]) 05/2011  . Dysuria 06/17/2011  . Other abnormal blood chemistry 0/30/2012  . Blood in stool 06/15/2012  . Other acquired deformity of toe 12/16/2011  . Corns and callosities 07/01/2011    Patient Active Problem List   Diagnosis Date Noted  . Protein-calorie malnutrition, severe 04/02/2015  . Troponin  level elevated   . Elevated troponin 03/28/2015  . Abnormal finding on EKG-new inferior/ septal TWI 03/28/2015  . DNR no code (do not resuscitate) 03/28/2015  . Pulmonary emboli ([place]) 03/28/2015  . Acute hypoxemic respiratory failure ([place]) 03/28/2015  . DVT, femoral, acute ([place]) 03/28/2015  . Acute pulmonary embolus ([place]) 03/28/2015  . Headache 03/23/2015  . Acute respiratory failure with  hypoxia ([place]) 03/12/2015  . Dyslipidemia 03/12/2015  . Acute encephalopathy 03/12/2015  . Healthcare-associated pneumonia-hospitalized 10/23- 03/19/15 03/11/2015  . Leukocytosis 03/11/2015  . Seizure disorder ([place]) 03/11/2015  . Depression 09/26/2014  . Memory impairment- SNF/ assited living pt 09/19/2012  . Hypothyroidism 12/08/2006    Allergies  Allergen Reactions  . Morphine And Related Other (See Comments)    Altered mental state   . Sulfa Antibiotics Other (See Comments)    Reaction: unknown    Medications: Patient's Medications  New Prescriptions   No medications on file  Previous Medications   ACETAMINOPHEN (TYLENOL) 650 MG CR TABLET    Take 650 mg by mouth 2 (two) times daily. x7 days. Started 03/23/15.   ANTISEPTIC ORAL RINSE (BIOTENE) LIQD    15 mLs by Mouth Rinse route 4 (four) times daily as needed for dry mouth.   CALCIUM CARBONATE-VITAMIN D (CALCIUM 600+D) 600-400 MG-UNIT TABLET    Take 1 tablet by mouth daily.   CHOLECALCIFEROL (VITAMIN D-3) 5000 UNITS TABS    Take 5,000 Units by mouth daily.    CLONAZEPAM (KLONOPIN) 0.5 MG TABLET    Take 1 tablet (0.5 mg total) by mouth 2 (two) times daily as needed (anxiety).   DONEPEZIL (ARICEPT) 10 MG TABLET    Take 10 mg by mouth daily.   FEEDING SUPPLEMENT, ENSURE ENLIVE, (ENSURE ENLIVE) LIQD    Take 237 mLs by mouth 2 (two) times daily between meals.   LEVOTHYROXINE (SYNTHROID, LEVOTHROID) 100 MCG TABLET    Take 1 tablet (100 mcg total) by mouth daily before breakfast. For thyroid   MULTIPLE VITAMIN (TAB-A-VITE PO)    Take 1 tablet by mouth daily.   OMEGA-3 ACID ETHYL ESTERS (LOVAZA) 1 G CAPSULE    Take 1 g by mouth daily. Fatty Acid one daily   ONDANSETRON (ZOFRAN) 4 MG TABLET    Take 1 tablet (4 mg total) by mouth every 6 (six) hours as needed for nausea.   PHENYTOIN (DILANTIN) 100 MG ER CAPSULE    Take 1 capsule by mouth in  the morning and 2 capsules  by mouth at bedtime to prevent seizure.   SACCHAROMYCES BOULARDII  (FLORASTOR) 250 MG CAPSULE    Take 250 mg by mouth 2 (two) times daily.   SERTRALINE (ZOLOFT) 50 MG TABLET    One daily to improve anxiety and depression   WARFARIN (COUMADIN) 4 MG TABLET    Take 1 tablet (4 mg total) by mouth daily at 6 PM.  Modified Medications   No medications on file  Discontinued Medications   No medications on file    Physical Exam: Filed Vitals:   04/06/15 1434  BP: 95/60  Pulse: 76  Temp: 100.2 F (37.9 C)  TempSrc: Tympanic  Resp: 20   There is no weight on file to calculate BMI.  Physical Exam  Constitutional: She is oriented to person, place, and time. She appears well-developed and well-nourished. No distress.  HENT:  Bilateral hearing loss. Rupture of the right TM.   Eyes: Conjunctivae and EOM are normal. Pupils are equal, round, and reactive to light.  Lens  implants in both eyes  Neck: Normal range of motion. Neck supple. No JVD present. No tracheal deviation present. No thyromegaly present.  Cardiovascular: Normal rate, regular rhythm, normal heart sounds and intact distal pulses.  Exam reveals no gallop and no friction rub.   No murmur heard. Pulmonary/Chest: Effort normal and breath sounds normal. No respiratory distress. She has no wheezes. She exhibits no tenderness.  Abdominal: Soft. Bowel sounds are normal. She exhibits no distension and no mass. There is no tenderness.  Genitourinary: Guaiac negative stool.  From previous exam: Hemorrhoids. Normal sphincter tone. Stools are normal brown color.  Musculoskeletal: Normal range of motion. She exhibits no edema or tenderness.  Tender in the posterior neck. Mild restriction in movements. Hammertoes at the left second and third. Right second toe overlaps the great toe. SP surgical repair of the right 2nd toe.  Shoulders sore. Painful in the right shoulder when she lifts overhead and rotates at the [facility] July 2014. Unstable gait. Using walker with 2 front wheels and rear skids. Weaker grip  of the right hand.. Tender in the left SI joint.  Lymphadenopathy:    She has no cervical adenopathy.  Neurological: She is alert and oriented to person, place, and time. No cranial nerve deficit. Coordination normal.  No loss of grip strength. Loss of memory. 04/04/14 MMSE 25/30. Passed clock drawing. 07/04/14 MMSE 28/30. Failed clock drawing. Bilateral tremor.  Skin: Skin is warm and dry. No rash noted. No erythema. No pallor.  Psychiatric: She has a normal mood and affect. Her behavior is normal. Thought content normal.    Labs reviewed: Basic Metabolic Panel:  Recent Labs  03/29/15 0232  03/31/15 0245 04/02/15 0553 04/03/15 0655 04/05/15  NA 137  < > 140 139 141 142  K 4.3  < > 4.2 4.1 3.9 4.8  CL 104  < > 103 103 103  --   CO2 24  < > '28 26 31  '$ --   GLUCOSE 159*  < > 90 105* 103*  --   BUN 15  < > '9 11 10 18  '$ CREATININE 0.64  < > 0.55 0.64 0.58 0.5  CALCIUM 8.3*  < > 8.8* 8.8* 8.9  --   MG 2.0  --   --  1.9  --   --   PHOS 4.2  --   --   --   --   --   < > = values in this interval not displayed.  Liver Function Tests:  Recent Labs  03/11/15 1838 03/23/15 03/29/15 1045  AST 22 26 33  ALT '15 19 25  '$ ALKPHOS 62 83 75  BILITOT 0.6  --  0.1*  PROT 6.4*  --  6.2*  ALBUMIN 3.3*  --  2.3*    CBC:  Recent Labs  01/11/15 1634  03/11/15 1838  03/28/15 1138  04/01/15 0536 04/02/15 0553 04/04/15 0519  WBC 5.5  < > 13.0*  < > 12.3*  < > 8.3 7.6 8.6  NEUTROABS 3.6  --  11.8*  --  8.3*  --   --   --   --   HGB 12.8  < > 12.5  < > 12.7  < > 11.3* 10.8* 11.6*  HCT 38.7  < > 38.3  < > 38.8  < > 35.7* 33.7* 35.9*  MCV 99.5  < > 98.2  < > 98.7  < > 98.3 99.7 99.4  PLT 195  < > 157  < > 255  < >  249 233 255  < > = values in this interval not displayed.  Lab Results  Component Value Date   TSH 1.47 03/08/2015   Lab Results  Component Value Date   HGBA1C 5.4 03/08/2015   Lab Results  Component Value Date   CHOL 235* 03/06/2009   HDL 108.40 03/06/2009    LDLDIRECT 108.2 03/06/2009    Significant Diagnostic Results since last visit: none  Patient Care Team: Estill Dooms, MD as PCP - General (Internal Medicine) [facility] [name], MD as Consulting Physician (Orthopedic Surgery) [name], MD as Consulting Physician (Physical Medicine and Rehabilitation) Melina Schools, MD as Consulting Physician (Orthopedic Surgery) [name], MD as Consulting Physician (Otolaryngology)  Assessment/Plan Problem List Items Addressed This Visit    Hypothyroidism (Chronic)    Continue Levothyroxine 162mg 01/18/15 TSH 1.723, 03/08/15 1.467      Memory impairment- SNF/ assited living pt (Chronic)    03/20/15 MMSE 27/30, continue Aricept      Depression (Chronic)    Sleeps well, mood is stable, continue Sertraline '50mg'$  daily, prn Clonazepam 0.'5mg'$  bid.       Seizure disorder ([place] ([place]ronic)    Last seizure 2010 02/01/15 Dilantin lever 12.4(10-20) Continue Dilantin '100mg'$  am, '200mg'$  hs      Pulmonary emboli (HCC)    -warfarin started on 03/30/15, PT/INR 23.4/2.05 -03/28/2015 TTE--EF 60-65%. No wall motion abnormalities. Grade 1 diastolic dysfunction. LA normal in size. RA mildly dilated. RV mildly dilated with preserved systolic function. PASP 544mg. No aortic stenosis or regurg      Acute hypoxemic respiratory failure (HCC)    -Secondary to pulmonary emboli -Initially placed on heparin drip--continue heparin bridge -Transitioned to coumadin -presently stable on 4L with saturation 94-95% -wean oxygen to keep oxygen sat >92% -11/14--CXR unchanged bilateral infiltrates -daily weights--remained stable at 118 pounds      DVT, femoral, acute (HCC) - Primary    Venous duplex of the lower extremities revealed a DVT in the left proximal femoral vein and DVT in the bilateral posterior temporal, peroneal, and soleal veins. On 03/29/2015, infrarenal IVC filter was placed by IR.      [facility]               Family/ staff Communication: goal is to return to [place] when able  Labs/tests ordered: BMP done 04/05/15  [facility] NP Geriatrics [place] [address] [id number] Call:  [phone] follow prompts after 5pm & weekends Office Phone:  [phone] Fax:  [phone]

## 2015-04-06 NOTE — Assessment & Plan Note (Signed)
Last seizure 2010 02/01/15 Dilantin lever 12.4(10-20) Continue Dilantin '100mg'$  am, '200mg'$  hs

## 2015-04-06 NOTE — Assessment & Plan Note (Signed)
03/20/15 MMSE 27/30, continue Aricept

## 2015-04-06 NOTE — Assessment & Plan Note (Signed)
-  warfarin started on 03/30/15, PT/INR 23.4/2.05 -03/28/2015 TTE--EF 60-65%. No wall motion abnormalities. Grade 1 diastolic dysfunction. LA normal in size. RA mildly dilated. RV mildly dilated with preserved systolic function. PASP 75mHg. No aortic stenosis or regurg

## 2015-04-16 ENCOUNTER — Non-Acute Institutional Stay (SKILLED_NURSING_FACILITY): Payer: Medicare Other | Admitting: Internal Medicine

## 2015-04-16 DIAGNOSIS — I82411 Acute embolism and thrombosis of right femoral vein: Secondary | ICD-10-CM

## 2015-04-16 DIAGNOSIS — J9601 Acute respiratory failure with hypoxia: Secondary | ICD-10-CM | POA: Diagnosis not present

## 2015-04-16 DIAGNOSIS — R2681 Unsteadiness on feet: Secondary | ICD-10-CM | POA: Diagnosis not present

## 2015-04-16 DIAGNOSIS — I2601 Septic pulmonary embolism with acute cor pulmonale: Secondary | ICD-10-CM | POA: Diagnosis not present

## 2015-04-16 DIAGNOSIS — R413 Other amnesia: Secondary | ICD-10-CM | POA: Diagnosis not present

## 2015-04-16 DIAGNOSIS — G40909 Epilepsy, unspecified, not intractable, without status epilepticus: Secondary | ICD-10-CM | POA: Diagnosis not present

## 2015-04-16 DIAGNOSIS — E039 Hypothyroidism, unspecified: Secondary | ICD-10-CM

## 2015-04-16 DIAGNOSIS — F329 Major depressive disorder, single episode, unspecified: Secondary | ICD-10-CM | POA: Diagnosis not present

## 2015-04-16 DIAGNOSIS — R9431 Abnormal electrocardiogram [ECG] [EKG]: Secondary | ICD-10-CM

## 2015-04-16 DIAGNOSIS — R531 Weakness: Secondary | ICD-10-CM | POA: Diagnosis not present

## 2015-04-16 DIAGNOSIS — F32A Depression, unspecified: Secondary | ICD-10-CM

## 2015-04-16 NOTE — Progress Notes (Signed)
Patient ID: Rebecca [name], female   DOB: 07/16/1927, 81 y.o.   MRN: [id number]    HISTORY AND PHYSICAL  Location:  [facility] Room Number: n [phone] Place of Service: SNF (31)   Extended Emergency Contact Information Primary Emergency Contact: Daft,[name] Address: [address], [place] Phone: [phone] Mobile Phone: [phone] Relation: Son Secondary Emergency Contact: [name] [place] Phone: [phone] Mobile Phone: [phone] Relation: Daughter  Advanced Directive information Does patient have an advance directive?: Yes, Type of Advance Directive: Out of facility DNR (pink MOST or yellow form);Living will;Healthcare Power of Attorney (at [facility]), Pre-existing out of facility DNR order (yellow form or pink MOST form): Yellow form placed in chart (order not valid for inpatient use), Does patient want to make changes to advanced directive?: No - Patient declined  Chief Complaint  Patient presents with  . Readmit To SNF    following hosptalization    HPI:  Patient was hospitalized from 03/28/2015 through 04/04/2015. She had acute respiratory failure with hypoxemia which was felt secondary to pulmonary emboli. Pulmonary emboli seemed related to DVT of the left leg and the left proximal femoral vein and in the posterior peroneal and soleal veins. Infrarenal IVC filter was placed by interventional radiology. She started on heparin drip and ultimately transitioned to warfarin.  Patient had recently been hospitalized for pneumonia. This is his second medical issue with her lungs and the last month and a half.  She was also determined to have cor pulmonale secondary to pulmonary emboli. Elevated troponins were noted and were likely due to the pulmonary emboli. There were changes on the electrocardiogram suggestive of acute cardiac event.  Patient was noted to have a chronic hypothyroidism  and is taking levothyroxine to compensate.  Cognitive impairment was noted. She was confused.  Seizure disorder was noted. She had had no seizures in many years, but Dilantin was continued.  Severe protein calorie malnutrition was felt to be present. She received supplements during her hospital stay.  Patient is generally weak because of her illnesses over the last month and a half. She is a fall risk and is unstable with walking. She remains dependent upon oxygen. She remains confused.  Patient is here for strengthening, improvement in mobility, and improvement in self-care. Because of her confusion, it is difficult to say whether she will improve enough to go to an assisted living area versus becoming long-term care in the skilled nursing facility environment.  Past Medical History  Diagnosis Date  . Osteoporosis 02/2011  . Seizures ([place])     Remotely  . DJD (degenerative joint disease)   . Diverticulosis 02/2011  . Hemorrhoids 02/2011  . Dizziness   . Low back pain   . Mitral valve problem thickened    thickened  . Unspecified hypothyroidism 01/2011  . 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 Vitamin D deficiency 01/2011  . Anemia, unspecified 03/18/2011  . Anxiety state, unspecified 01/2011  . 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 Pain in joint, site unspecified 01/2012  . Sacroiliitis, not elsewhere classified ([place]) 05/2011  . Dysuria 06/17/2011  . Other abnormal blood chemistry 0/30/2012  . Blood in stool 06/15/2012  . Other acquired deformity of toe 12/16/2011  . Corns and callosities 07/01/2011    Past Surgical History  Procedure Laterality Date  . Cervical laminectomy  2005  . Bletheroplasty    . Hammer toe surgery  2013    right 2nd toe Dr. Mallie Mussel  . Joint replacement Bilateral 2002 Right, 1992 Left    knees  . Cataract extraction w/ intraocular lens  implant, bilateral  2010    Patient Care Team: Estill Dooms, MD as PCP - General (Internal Medicine) [facility] [name], MD as Consulting Physician (Orthopedic Surgery) [name], MD as Consulting Physician (Physical Medicine and Rehabilitation) Melina Schools, MD as Consulting Physician (Orthopedic Surgery) [name], MD as Consulting Physician (Otolaryngology)  Social History   Social History  . Marital Status: Widowed    Spouse Name: N/A  . Number of Children: N/A  . Years of Education: N/A   Occupational History  . Not on file.   Social History Main Topics  . Smoking status: Former Smoker    Quit date: 09/14/1988  . Smokeless tobacco: Never Used     Comment: Smoked <1ppd  . Alcohol Use: 0.6 oz/week    1 Glasses of wine per week     Comment: doesn't drink routinely & only on social occasions  . Drug Use: No  . Sexual Activity: No   Other Topics Concern  . Not on file   Social History Narrative   L[facility] at [facility] since 10/07/2010, moved to [place] 01/16/2015   Widowed   Exercise class 3 times a week     Never smoked   Alcholol wine social    POA, Living Will      [place] [facility]/CC:   Primary MPOA:  Christell Faith (Daughter) - [phone]   Secondary MPOA:  Sativa Gelles [facility]) - [phone]    reports that she quit smoking about 26 years ago. She has never used smokeless tobacco. She reports that she drinks about 0.6 oz of alcohol per week. She reports that she does not use illicit drugs.  Family History  Problem Relation Age of Onset  . Alzheimer's disease Sister   . Emphysema Sister    Family Status  Relation Status Death Age  . Sister Deceased   . Sister Deceased 52  . Mother Deceased 22    CVA  . Father Deceased 4  . Sister Alive   . Daughter Alive   . Son Alive     Immunization History  Administered Date(s) Administered  . Influenza Whole 03/17/2007, 02/16/2009, 01/30/2010, 02/16/2013  . Influenza-Unspecified 03/02/2014  . PPD Test 07/09/2010  . Pneumococcal  Polysaccharide-23 05/19/2005, 05/23/2013  . Td 05/19/2005  . Tetanus 10/06/2012    Allergies  Allergen Reactions  . Morphine And Related Other (See Comments)    Altered mental state   . Sulfa Antibiotics Other (See Comments)    Reaction: unknown    Medications: Patient's Medications  New Prescriptions   No medications on file  Previous Medications   ANTISEPTIC ORAL RINSE (BIOTENE) LIQD    15 mLs by Mouth Rinse route 4 (four) times daily as needed for dry mouth.   CALCIUM CARBONATE-VITAMIN D (CALCIUM 600+D) 600-400 MG-UNIT TABLET    Take 1 tablet by mouth daily.   CHOLECALCIFEROL (VITAMIN D-3) 5000 UNITS TABS    Take 5,000 Units by mouth daily.    CLONAZEPAM (KLONOPIN) 0.5 MG TABLET    Take 1 tablet (0.5 mg total) by mouth 2 (two) times daily as needed (anxiety).   DONEPEZIL (ARICEPT) 10 MG TABLET    Take 10 mg by mouth daily.   FEEDING SUPPLEMENT, ENSURE ENLIVE, (ENSURE  ENLIVE) LIQD    Take 237 mLs by mouth 2 (two) times daily between meals.   LEVOTHYROXINE (SYNTHROID, LEVOTHROID) 100 MCG TABLET    Take 1 tablet (100 mcg total) by mouth daily before breakfast. For thyroid   MULTIPLE VITAMIN (TAB-A-VITE PO)    Take 1 tablet by mouth daily.   OMEGA-3 ACID ETHYL ESTERS (LOVAZA) 1 G CAPSULE    Take 1 g by mouth daily. Fatty Acid one daily   ONDANSETRON (ZOFRAN) 4 MG TABLET    Take 1 tablet (4 mg total) by mouth every 6 (six) hours as needed for nausea.   PHENYTOIN (DILANTIN) 100 MG ER CAPSULE    Take 1 capsule by mouth in  the morning and 2 capsules  by mouth at bedtime to prevent seizure.   SACCHAROMYCES BOULARDII (FLORASTOR) 250 MG CAPSULE    Take 250 mg by mouth 2 (two) times daily.   SERTRALINE (ZOLOFT) 50 MG TABLET    One daily to improve anxiety and depression   WARFARIN (COUMADIN) 4 MG TABLET    Take 1 tablet (4 mg total) by mouth daily at 6 PM.  Modified Medications   No medications on file  Discontinued Medications   ACETAMINOPHEN (TYLENOL) 650 MG CR TABLET    Take 650 mg by  mouth 2 (two) times daily. x7 days. Started 03/23/15.    Review of Systems  Constitutional: Negative for diaphoresis.  HENT: Positive for hearing loss. Negative for congestion, ear discharge, ear pain, nosebleeds, sore throat and tinnitus.        Bilateral hearing loss. June 2015 ruptured right Tm with drainage, but little pain.  Eyes: Negative.   Respiratory: Positive for cough (Producing some thick mucus) and shortness of breath.        History of dyspnea on exertion.  O2 dependent.  Cardiovascular: Negative.   Gastrointestinal:       Blood noted in stool and toilet paper occasionally.  Endocrine: Negative for polydipsia.  Genitourinary: Positive for frequency.       2-3x/night  Musculoskeletal: Positive for gait problem.       Osteoarthritis with complaints of pain in multiple areas. Neck and right shoulder pains. Sharp pain in the right shoulder and arm when she abducts and rotated at the shoulder. Generalized weakness.  Skin:       Senile ecchymoses  Allergic/Immunologic: Negative for environmental allergies.  Neurological: Positive for dizziness and tremors.       Hx seizures.   Psychiatric/Behavioral: Positive for suicidal ideas, confusion and dysphoric mood. The patient is nervous/anxious.        Chronic anxiety. Memory deficits.    Filed Vitals:   04/16/15 1534  BP: 110/60  Pulse: 72  Temp: 97.6 F (36.4 C)  SpO2: 93%   There is no weight on file to calculate BMI.  Physical Exam  Constitutional: She is oriented to person, place, and time. She appears well-developed and well-nourished. No distress.  HENT:  Bilateral hearing loss. Rupture of the right TM.   Eyes: Conjunctivae and EOM are normal. Pupils are equal, round, and reactive to light.  Lens implants in both eyes  Neck: Normal range of motion. Neck supple. No JVD present. No tracheal deviation present. No thyromegaly present.  Cardiovascular: Normal rate, regular rhythm, normal heart sounds and intact  distal pulses.  Exam reveals no gallop and no friction rub.   No murmur heard. Pulmonary/Chest: Effort normal and breath sounds normal. No respiratory distress. She has no wheezes. She exhibits no  tenderness.  Coughing. Oxygen dependent.  Abdominal: Soft. Bowel sounds are normal. She exhibits no distension and no mass. There is no tenderness.  Genitourinary: Guaiac negative stool.  From previous exam: Hemorrhoids. Normal sphincter tone. Stools are normal brown color.  Musculoskeletal: Normal range of motion. She exhibits no edema or tenderness.  Tender in the posterior neck. Mild restriction in movements. Hammertoes at the left second and third. Right second toe overlaps the great toe. SP surgical repair of the right 2nd toe.  Shoulders sore. Painful in the right shoulder when she lifts overhead and rotates at the shpoulder.[facility] July 2014. Unstable gait. Using walker with 2 front wheels and rear skids. Generalized weakness. Using 4. walker Weaker grip of the right hand.. Tender in the left SI joint.  Lymphadenopathy:    She has no cervical adenopathy.  Neurological: She is alert and oriented to person, place, and time. No cranial nerve deficit. Coordination normal.  No loss of grip strength. Loss of memory. 04/04/14 MMSE 25/30. Passed clock drawing. 07/04/14 MMSE 28/30. Failed clock drawing. 03/20/2015 MMSE 27/30. Passed clock drawing. Bilateral tremor.  Skin: Skin is warm and dry. No rash noted. No erythema. No pallor.  Psychiatric: She has a normal mood and affect. Her behavior is normal. Thought content normal.    Labs reviewed: Lab Summary Latest Ref Rng 04/05/2015 04/04/2015 04/03/2015 04/02/2015 04/01/2015 03/31/2015 03/30/2015  Hemoglobin 12.0 - 15.0 g/dL (None) 11.6(L) (None) 10.8(L) 11.3(L) 11.0(L) 11.0(L)  Hematocrit 36.0 - 46.0 % (None) 35.9(L) (None) 33.7(L) 35.7(L) 35.1(L) 35.0(L)  White count 4.0 - 10.5 K/uL (None) 8.6 (None) 7.6 8.3 9.0 8.7  Platelet count 150 - 400  K/uL (None) 255 (None) 233 249 260 239  Sodium 137 - 147 mmol/L 142 (None) 141 139 (None) 140 142  Potassium 3.4 - 5.3 mmol/L 4.8 (None) 3.9 4.1 (None) 4.2 3.9  Calcium 8.9 - 10.3 mg/dL (None) (None) 8.9 8.8(L) (None) 8.8(L) 8.8(L)  Phosphorus 2.5 - 4.6 mg/dL (None) (None) (None) (None) (None) (None) (None)  Creatinine 0.5 - 1.1 mg/dL 0.5 (None) 0.58 0.64 (None) 0.55 0.56  AST 15 - 41 U/L (None) (None) (None) (None) (None) (None) (None)  Alk Phos 38 - 126 U/L (None) (None) (None) (None) (None) (None) (None)  Bilirubin 0.3 - 1.2 mg/dL (None) (None) (None) (None) (None) (None) (None)  Glucose - 107 (None) 103(H) 105(H) (None) 90 83  Cholesterol - (None) (None) (None) (None) (None) (None) (None)  HDL cholesterol - (None) (None) (None) (None) (None) (None) (None)  Triglycerides - (None) (None) (None) (None) (None) (None) (None)  LDL Direct - (None) (None) (None) (None) (None) (None) (None)  LDL Calc - (None) (None) (None) (None) (None) (None) (None)  Total protein 6.5 - 8.1 g/dL (None) (None) (None) (None) (None) (None) (None)  Albumin 3.5 - 5.0 g/dL (None) (None) (None) (None) (None) (None) (None)   Lab Results  Component Value Date   BUN 18 04/05/2015   Lab Results  Component Value Date   HGBA1C 5.4 03/08/2015   Lab Results  Component Value Date   TSH 1.47 03/08/2015          Dg Chest 2 View  03/28/2015  CLINICAL DATA:  Low oxygen.  Increased short of breath. EXAM: CHEST  2 VIEW COMPARISON:  CT 03/16/2015, chest radiograph 03/16/2015 FINDINGS: Normal cardiac silhouette. There is bilateral interstitial lung disease. There is slight increase in density in the upper lobes suggesting superimposed infection or edema. No pneumothorax. IMPRESSION: Interstitial lung disease with superimposed mild pulmonary edema versus  pneumonia. Electronically Signed   By: Suzy Bouchard M.D.   On: 03/28/2015 12:31   Ct Angio Chest Pe W/cm &/or Wo Cm  03/28/2015  CLINICAL DATA:  Shortness of Breath  EXAM: CT ANGIOGRAPHY CHEST WITH CONTRAST TECHNIQUE: Multidetector CT imaging of the chest was performed using the standard protocol during bolus administration of intravenous contrast. Multiplanar CT image reconstructions and MIPs were obtained to evaluate the vascular anatomy. CONTRAST:  68m OMNIPAQUE IOHEXOL 350 MG/ML SOLN COMPARISON:  03/16/2015 FINDINGS: The lungs are well expanded bilaterally. There again noted diffuse chronic fibrotic changes bilaterally. The degree of chronic last infiltrate has improved in the interval. Multiple calcified granulomas are seen. The previously seen pleural effusions have also resolved in the interval. The thoracic inlet shows no acute abnormality. The thoracic aorta demonstrates a normal branching pattern. Some calcific changes are seen without aneurysmal dilatation or signs of dissection. The coronary arteries demonstrate mild calcifications. The pulmonary artery is well visualized and demonstrates multiple filling defects throughout the lower lobes bilaterally consistent with pulmonary emboli. There is prominence of the right ventricle with evidence of right heart strain. The RV to LV ratio is 1.2. This is indicative of sub massive pulmonary embolism and right heart strain. No definitive filling defects are noted within the right ventricle. There are lymph node is again identified in the aorticopulmonary window as well as in the left suprahilar region stable from the prior exam. No new focal adenopathy is noted. The visualized upper abdomen is within normal limits. Degenerative changes of thoracic spine are again seen and stable. Review of the MIP images confirms the above findings. IMPRESSION: Positive for acute PE with CT evidence of right heart strain (RV/LV Ratio = 1.2) consistent with at least submassive (intermediate risk) PE. The presence of right heart strain has been associated with an increased risk of morbidity and mortality. Please activate Code PE by paging  [phone] Chronic fibrotic changes in the lungs bilaterally. Otherwise stable appearance from the prior study. These results were called by telephone at the time of interpretation on 03/28/2015 at 1:57 pm to Dr. DDeno Etienne, who verbally acknowledged these results. Electronically Signed   By: MInez CatalinaM.D.   On: 03/28/2015 13:58   Ir Ivc Filter Plmt / S&i /img Guid/mod Sed  03/29/2015  INDICATION: 81year old female with acute sub massive pulmonary embolus. Lower extremity duplex ultrasound demonstrates mobile DVT in the left proximal femoral vein extending into the common femoral vein with additional occlusive thrombus in the bilateral calf veins. Given existing presence of right heart strain and freely mobile thrombus, temporary caval interruption is warranted for dual prophylaxis. The patient is currently heparinized. EXAM: ULTRASOUND GUIDANCE FOR VASCULARACCESS IVC CATHETERIZATION AND VENOGRAM IVC FILTER INSERTION COMPARISON:  CTA chest 03/28/2015; duplex venous ultrasound 03/28/2015 MEDICATIONS: Fentanyl 50 mcg IV; Versed 1 mg IV ANESTHESIA/SEDATION: Sedation Time 15 minutes CONTRAST:  516mOMNIPAQUE IOHEXOL 300 MG/ML  SOLN FLUOROSCOPY TIME:  None COMPLICATIONS: None immediate PROCEDURE: Informed consent was obtained from the patient following explanation of the procedure, risks, benefits and alternatives. The patient understands, agrees and consents for the procedure. All questions were addressed. A time out was performed prior to the initiation of the procedure. Maximal barrier sterile technique utilized including caps, mask, sterile gowns, sterile gloves, large sterile drape, hand hygiene, and Betadine prep. Under sterile condition and local anesthesia, right internal jugular venous access was performed with ultrasound. An ultrasound image was saved and sent to PACS. Over a guidewire, the IVC filter delivery  sheath and inner dilator were advanced into the IVC just above the IVC bifurcation.  Contrast injection was performed for an IVC venogram. Through the delivery sheath, a retrievable [place] IVC filter was deployed below the level of the renal veins and above the IVC bifurcation. Limited post deployment venacavagram was performed. The delivery sheath was removed and hemostasis was obtained with manual compression. A dressing was placed. The patient tolerated the procedure well without immediate post procedural complication. FINDINGS: The IVC is patent. No evidence of thrombus, stenosis, or occlusion. No variant venous anatomy. Successful placement of the IVC filter below the level of the renal veins. IMPRESSION: Successful ultrasound and fluoroscopically guided placement of an infrarenal retrievable IVC filter via right jugular approach. This IVC filter is potentially retrievable. The patient will be assessed for filter retrieval by Interventional Radiology in approximately 8-12 weeks. Further recommendations regarding filter retrieval, continued surveillance or declaration of device permanence, will be made at that time. Signed, Criselda Peaches, MD [facility] Radiology Electronically Signed   By: Jacqulynn Cadet M.D.   On: 03/29/2015 14:52   Dg Chest Port 1 View  04/02/2015  CLINICAL DATA:  Acute respiratory failure EXAM: PORTABLE CHEST - 1 VIEW COMPARISON:  03/28/2015 FINDINGS: Cardiac shadow is stable. Patchy infiltrative changes are noted throughout both lungs and stable from the prior study. No new focal abnormality is seen. No bony abnormality is noted. IMPRESSION: No change from the prior exam. Electronically Signed   By: Inez Catalina M.D.   On: 04/02/2015 17:18     Assessment/Plan  1. Acute hypoxemic respiratory failure (HCC) Acute events have resolved, but she remains dependent upon oxygen.  2. Acute septic pulmonary embolism with acute cor pulmonale ([place]) Patient remains oxygen dependent  3. Acute deep vein thrombosis  (DVT) of femoral vein of right lower extremity (HCC) No pains in her thighs  4. Abnormal finding on EKG-new inferior/ septal TWI Observe. Changes likely related pulmonary emboli and right ventricular strain.  5. Weak Started with physical therapy and occupational therapy  6. Unstable gait All risk. Continue use walker  7. Memory impairment- SNF/ assited living pt Continue to monitor.  8. Seizure disorder ([place]) Continue phenytoin  9. Hypothyroidism, unspecified hypothyroidism type Continue levothyroxine  10. Depression Continue to monitor and continue sertraline.

## 2015-04-17 ENCOUNTER — Encounter: Payer: Medicare Other | Admitting: Internal Medicine

## 2015-04-23 ENCOUNTER — Other Ambulatory Visit: Payer: Self-pay | Admitting: Interventional Radiology

## 2015-04-23 DIAGNOSIS — Z95828 Presence of other vascular implants and grafts: Secondary | ICD-10-CM

## 2015-04-27 ENCOUNTER — Non-Acute Institutional Stay (SKILLED_NURSING_FACILITY): Payer: Medicare Other | Admitting: Nurse Practitioner

## 2015-04-27 ENCOUNTER — Encounter: Payer: Self-pay | Admitting: Nurse Practitioner

## 2015-04-27 DIAGNOSIS — E039 Hypothyroidism, unspecified: Secondary | ICD-10-CM

## 2015-04-27 DIAGNOSIS — I2699 Other pulmonary embolism without acute cor pulmonale: Secondary | ICD-10-CM | POA: Diagnosis not present

## 2015-04-27 DIAGNOSIS — F329 Major depressive disorder, single episode, unspecified: Secondary | ICD-10-CM

## 2015-04-27 DIAGNOSIS — G40909 Epilepsy, unspecified, not intractable, without status epilepticus: Secondary | ICD-10-CM

## 2015-04-27 DIAGNOSIS — J9601 Acute respiratory failure with hypoxia: Secondary | ICD-10-CM

## 2015-04-27 DIAGNOSIS — I82411 Acute embolism and thrombosis of right femoral vein: Secondary | ICD-10-CM

## 2015-04-27 DIAGNOSIS — F32A Depression, unspecified: Secondary | ICD-10-CM

## 2015-04-27 NOTE — Assessment & Plan Note (Signed)
-  Secondary to pulmonary emboli -continue coumadin -stable on 4L with saturation 94-95% -wean oxygen to keep oxygen sat >92% -11/14--CXR unchanged bilateral infiltrates -daily weights--remained stable  -continue Breo Ellipta daily, Clonazepam 0.'5mg'$  bid, encourage incentive spirometer

## 2015-04-27 NOTE — Assessment & Plan Note (Signed)
Last seizure 2010 02/01/15 Dilantin lever 12.4(10-20) Continue Dilantin '100mg'$  am, '200mg'$  hs

## 2015-04-27 NOTE — Assessment & Plan Note (Signed)
Continue Levothyroxine 161mg 01/18/15 TSH 1.723, 03/08/15 1.467

## 2015-04-27 NOTE — Assessment & Plan Note (Signed)
Venous duplex of the lower extremities revealed a DVT in the left proximal femoral vein and DVT in the bilateral posterior temporal, peroneal, and soleal veins. On 03/29/2015, infrarenal IVC filter was placed by IR.

## 2015-04-27 NOTE — Assessment & Plan Note (Signed)
Sleeps well, mood is stable, continue Sertraline '50mg'$  daily, Clonazepam 0.'5mg'$  bid.

## 2015-04-27 NOTE — Assessment & Plan Note (Signed)
Bilateral, continue Coumadin, INR goal 2-3

## 2015-04-27 NOTE — Progress Notes (Signed)
Patient ID: Rebecca [name], female   DOB: 1927-07-05, 81 y.o.   MRN: [id number]  Location:  SNF [facility] Provider:  Marlana Latus NP  Code Status:  DNR Goals of care: Advanced Directive information    Chief Complaint  Patient presents with  . Medical Management of Chronic Issues  . Acute Visit    SOB     HPI: Patient is a 81 y.o. female seen in the SNF at [facility] today for evaluation of s/p persisted SOB, 04/19/15 started Breo Ellipta daily, scheduled Clonazepam 0.'5mg'$  bid  81 y.o. hospitalization 03/28/15 to 04/04/15 for bilateral pulmonary emboli. CT angiogram of the chest was obtained as part of the initial workup and revealed bilateral pulmonary emboli with indication of right heart strain on the CT. Transthoracic echocardiogram revealed largely normal left ventricular, aortic valve, and mitral valve function. Her right ventricle is mildly dilated with an elevated pulmonary artery systolic pressure (52 mmHg) consistent with her acute pulmonary emboli. TTE also showed mild LVH. EF 60-65%. No wall motion abnormalities. Grade 1 diastolic dysfunction.     S/p sepsis due to PNA, Hx of  Hypothyroidism, Memory loss, Depression, Seizure disorder ([place]), Dyslipidemia   Hospitalized from 03/11/2015-03/19/2015 for sepsis due to PNA. She presented to ED with T 102.9, Bp 80/64, RR 26, Sat O2 89%, and altered mentation. CXR showed possible bilateral pneumonia. She was started on vanco and zosyn for treatment of HCAP, completed Levaquin in SNF   Review of Systems  Constitutional: Positive for malaise/fatigue. Negative for diaphoresis.  HENT: Positive for hearing loss. Negative for congestion, ear discharge, ear pain, nosebleeds, sore throat and tinnitus.        Bilateral hearing loss. June 2015 ruptured right Tm with drainage, but little pain.  Eyes: Negative.   Respiratory:       History of dyspnea on exertion  Cardiovascular: Negative.   Gastrointestinal:       Blood noted in stool and toilet paper  occasionally.  Genitourinary: Positive for frequency.       2-3x/night  Musculoskeletal:       Osteoarthritis with complaints of pain in multiple areas. Neck and right shoulder pains. Sharp pain in the right shoulder and arm when she abducts and rotated at the shoulder  Skin:       Senile ecchymoses  Neurological: Positive for dizziness and tremors. Negative for tingling, sensory change, speech change, focal weakness and seizures.       Hx seizures. C/o headache 10/10 right sided   Endo/Heme/Allergies: Negative for environmental allergies and polydipsia.  Psychiatric/Behavioral: Positive for suicidal ideas. The patient has insomnia.        Chronic anxiety. Memory deficits.    Past Medical History  Diagnosis Date  . Osteoporosis 02/2011  . Seizures ([place])     Remotely  . DJD (degenerative joint disease)   . Diverticulosis 02/2011  . Hemorrhoids 02/2011  . Dizziness   . Low back pain   . Mitral valve problem thickened    thickened  . Unspecified hypothyroidism 01/2011  . 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 Vitamin D deficiency 01/2011  . Anemia, unspecified 03/18/2011  . Anxiety state, unspecified 01/2011  . 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 Pain in joint, site unspecified 01/2012  . Sacroiliitis, not elsewhere classified ([place]) 05/2011  . Dysuria 06/17/2011  . Other abnormal blood chemistry 0/30/2012  . Blood in stool 06/15/2012  . Other acquired deformity of toe 12/16/2011  .  Corns and callosities 07/01/2011    Patient Active Problem List   Diagnosis Date Noted  . Weak 04/16/2015  . Unstable gait 04/16/2015  . Protein-calorie malnutrition, severe 04/02/2015  . Elevated troponin 03/28/2015  . Abnormal finding on EKG-new inferior/ septal TWI 03/28/2015  . DNR no code (do not resuscitate) 03/28/2015  . Acute hypoxemic respiratory failure ([place]) 03/28/2015  . DVT, femoral, acute ([place]) 03/28/2015  . Acute pulmonary  embolus ([place]) 03/28/2015  . Headache 03/23/2015  . Dyslipidemia 03/12/2015  . Seizure disorder ([place]) 03/11/2015  . Depression 09/26/2014  . Memory impairment- SNF/ assited living pt 09/19/2012  . Hypothyroidism 12/08/2006    Allergies  Allergen Reactions  . Morphine And Related Other (See Comments)    Altered mental state   . Sulfa Antibiotics Other (See Comments)    Reaction: unknown    Medications: Patient's Medications  New Prescriptions   No medications on file  Previous Medications   ANTISEPTIC ORAL RINSE (BIOTENE) LIQD    15 mLs by Mouth Rinse route 4 (four) times daily as needed for dry mouth.   CALCIUM CARBONATE-VITAMIN D (CALCIUM 600+D) 600-400 MG-UNIT TABLET    Take 1 tablet by mouth daily.   CHOLECALCIFEROL (VITAMIN D-3) 5000 UNITS TABS    Take 5,000 Units by mouth daily.    CLONAZEPAM (KLONOPIN) 0.5 MG TABLET    Take 1 tablet (0.5 mg total) by mouth 2 (two) times daily as needed (anxiety).   DONEPEZIL (ARICEPT) 10 MG TABLET    Take 10 mg by mouth daily.   FEEDING SUPPLEMENT, ENSURE ENLIVE, (ENSURE ENLIVE) LIQD    Take 237 mLs by mouth 2 (two) times daily between meals.   LEVOTHYROXINE (SYNTHROID, LEVOTHROID) 100 MCG TABLET    Take 1 tablet (100 mcg total) by mouth daily before breakfast. For thyroid   MULTIPLE VITAMIN (TAB-A-VITE PO)    Take 1 tablet by mouth daily.   OMEGA-3 ACID ETHYL ESTERS (LOVAZA) 1 G CAPSULE    Take 1 g by mouth daily. Fatty Acid one daily   ONDANSETRON (ZOFRAN) 4 MG TABLET    Take 1 tablet (4 mg total) by mouth every 6 (six) hours as needed for nausea.   PHENYTOIN (DILANTIN) 100 MG ER CAPSULE    Take 1 capsule by mouth in  the morning and 2 capsules  by mouth at bedtime to prevent seizure.   SACCHAROMYCES BOULARDII (FLORASTOR) 250 MG CAPSULE    Take 250 mg by mouth 2 (two) times daily.   SERTRALINE (ZOLOFT) 50 MG TABLET    One daily to improve anxiety and depression   WARFARIN (COUMADIN) 4 MG TABLET    Take 1 tablet (4 mg total) by mouth daily at  6 PM.  Modified Medications   No medications on file  Discontinued Medications   No medications on file    Physical Exam: Filed Vitals:   04/27/15 1005  BP: 104/55  Pulse: 79  Temp: 99.9 F (37.7 C)  TempSrc: Tympanic  Resp: 20   There is no weight on file to calculate BMI.  Physical Exam  Constitutional: She is oriented to person, place, and time. She appears well-developed and well-nourished. No distress.  HENT:  Bilateral hearing loss. Rupture of the right TM.   Eyes: Conjunctivae and EOM are normal. Pupils are equal, round, and reactive to light.  Lens implants in both eyes  Neck: Normal range of motion. Neck supple. No JVD present. No tracheal deviation present. No thyromegaly present.  Cardiovascular: Normal rate, regular  rhythm, normal heart sounds and intact distal pulses.  Exam reveals no gallop and no friction rub.   No murmur heard. Pulmonary/Chest: Effort normal and breath sounds normal. No respiratory distress. She has no wheezes. She exhibits no tenderness.  Abdominal: Soft. Bowel sounds are normal. She exhibits no distension and no mass. There is no tenderness.  Genitourinary: Guaiac negative stool.  From previous exam: Hemorrhoids. Normal sphincter tone. Stools are normal brown color.  Musculoskeletal: Normal range of motion. She exhibits no edema or tenderness.  Tender in the posterior neck. Mild restriction in movements. Hammertoes at the left second and third. Right second toe overlaps the great toe. SP surgical repair of the right 2nd toe.  Shoulders sore. Painful in the right shoulder when she lifts overhead and rotates at the shpoulder.[facility] July 2014. Unstable gait. Using walker with 2 front wheels and rear skids. Weaker grip of the right hand.. Tender in the left SI joint.  Lymphadenopathy:    She has no cervical adenopathy.  Neurological: She is alert and oriented to person, place, and time. No cranial nerve deficit. Coordination normal.  No loss of  grip strength. Loss of memory. 04/04/14 MMSE 25/30. Passed clock drawing. 07/04/14 MMSE 28/30. Failed clock drawing. Bilateral tremor.  Skin: Skin is warm and dry. No rash noted. No erythema. No pallor.  Psychiatric: She has a normal mood and affect. Her behavior is normal. Thought content normal.    Labs reviewed: Basic Metabolic Panel:  Recent Labs  03/29/15 0232  03/31/15 0245 04/02/15 0553 04/03/15 0655 04/05/15  NA 137  < > 140 139 141 142  K 4.3  < > 4.2 4.1 3.9 4.8  CL 104  < > 103 103 103  --   CO2 24  < > '28 26 31  '$ --   GLUCOSE 159*  < > 90 105* 103*  --   BUN 15  < > '9 11 10 18  '$ CREATININE 0.64  < > 0.55 0.64 0.58 0.5  CALCIUM 8.3*  < > 8.8* 8.8* 8.9  --   MG 2.0  --   --  1.9  --   --   PHOS 4.2  --   --   --   --   --   < > = values in this interval not displayed.  Liver Function Tests:  Recent Labs  03/11/15 1838 03/23/15 03/29/15 1045  AST 22 26 33  ALT '15 19 25  '$ ALKPHOS 62 83 75  BILITOT 0.6  --  0.1*  PROT 6.4*  --  6.2*  ALBUMIN 3.3*  --  2.3*    CBC:  Recent Labs  01/11/15 1634  03/11/15 1838  03/28/15 1138  04/01/15 0536 04/02/15 0553 04/04/15 0519  WBC 5.5  < > 13.0*  < > 12.3*  < > 8.3 7.6 8.6  NEUTROABS 3.6  --  11.8*  --  8.3*  --   --   --   --   HGB 12.8  < > 12.5  < > 12.7  < > 11.3* 10.8* 11.6*  HCT 38.7  < > 38.3  < > 38.8  < > 35.7* 33.7* 35.9*  MCV 99.5  < > 98.2  < > 98.7  < > 98.3 99.7 99.4  PLT 195  < > 157  < > 255  < > 249 233 255  < > = values in this interval not displayed.  Lab Results  Component Value Date   TSH 1.47 03/08/2015  Lab Results  Component Value Date   HGBA1C 5.4 03/08/2015   Lab Results  Component Value Date   CHOL 235* 03/06/2009   HDL 108.40 03/06/2009   LDLDIRECT 108.2 03/06/2009    Significant Diagnostic Results since last visit: none  Patient Care Team: Estill Dooms, MD as PCP - General (Internal Medicine) [facility] [name], MD as Consulting Physician (Orthopedic  Surgery) [name], MD as Consulting Physician (Physical Medicine and Rehabilitation) Melina Schools, MD as Consulting Physician (Orthopedic Surgery) [name], MD as Consulting Physician (Otolaryngology)  Assessment/Plan Problem List Items Addressed This Visit    Acute hypoxemic respiratory failure ([place]) - Primary (Chronic)    -Secondary to pulmonary emboli -continue coumadin -stable on 4L with saturation 94-95% -wean oxygen to keep oxygen sat >92% -11/14--CXR unchanged bilateral infiltrates -daily weights--remained stable  -continue Breo Ellipta daily, Clonazepam 0.'5mg'$  bid, encourage incentive spirometer       Acute pulmonary embolus (HCC) (Chronic)    Bilateral, continue Coumadin, INR goal 2-3      Depression (Chronic)    Sleeps well, mood is stable, continue Sertraline '50mg'$  daily, Clonazepam 0.'5mg'$  bid.       DVT, femoral, acute (HCC) (Chronic)    Venous duplex of the lower extremities revealed a DVT in the left proximal femoral vein and DVT in the bilateral posterior temporal, peroneal, and soleal veins. On 03/29/2015, infrarenal IVC filter was placed by IR.      Hypothyroidism (Chronic)    Continue Levothyroxine 159mg 01/18/15 TSH 1.723, 03/08/15 1.467      Seizure disorder ([facility]) (Chronic)    Last seizure 2010 02/01/15 Dilantin lever 12.4(10-20) Continue Dilantin '100mg'$  am, '200mg'$  hs          Family/ staff Communication: goal is to return to [place] when able  Labs/tests ordered: none  [facility]Mast NP Geriatrics [facility] [address] [place] [id number] Call:  [phone]& follow prompts after 5pm & weekends Office Phone:  [phone] Fax:  [phone]

## 2015-05-07 DIAGNOSIS — I480 Paroxysmal atrial fibrillation: Secondary | ICD-10-CM | POA: Diagnosis not present

## 2015-05-08 DIAGNOSIS — M779 Enthesopathy, unspecified: Secondary | ICD-10-CM | POA: Diagnosis not present

## 2015-05-08 DIAGNOSIS — R262 Difficulty in walking, not elsewhere classified: Secondary | ICD-10-CM | POA: Diagnosis not present

## 2015-05-08 DIAGNOSIS — M818 Other osteoporosis without current pathological fracture: Secondary | ICD-10-CM | POA: Diagnosis not present

## 2015-05-08 DIAGNOSIS — R2681 Unsteadiness on feet: Secondary | ICD-10-CM | POA: Diagnosis not present

## 2015-05-08 DIAGNOSIS — M6281 Muscle weakness (generalized): Secondary | ICD-10-CM | POA: Diagnosis not present

## 2015-05-09 DIAGNOSIS — R2681 Unsteadiness on feet: Secondary | ICD-10-CM | POA: Diagnosis not present

## 2015-05-09 DIAGNOSIS — M779 Enthesopathy, unspecified: Secondary | ICD-10-CM | POA: Diagnosis not present

## 2015-05-09 DIAGNOSIS — R262 Difficulty in walking, not elsewhere classified: Secondary | ICD-10-CM | POA: Diagnosis not present

## 2015-05-09 DIAGNOSIS — M6281 Muscle weakness (generalized): Secondary | ICD-10-CM | POA: Diagnosis not present

## 2015-05-09 DIAGNOSIS — M818 Other osteoporosis without current pathological fracture: Secondary | ICD-10-CM | POA: Diagnosis not present

## 2015-05-10 DIAGNOSIS — R262 Difficulty in walking, not elsewhere classified: Secondary | ICD-10-CM | POA: Diagnosis not present

## 2015-05-10 DIAGNOSIS — M6281 Muscle weakness (generalized): Secondary | ICD-10-CM | POA: Diagnosis not present

## 2015-05-10 DIAGNOSIS — R2681 Unsteadiness on feet: Secondary | ICD-10-CM | POA: Diagnosis not present

## 2015-05-10 DIAGNOSIS — M779 Enthesopathy, unspecified: Secondary | ICD-10-CM | POA: Diagnosis not present

## 2015-05-10 DIAGNOSIS — M818 Other osteoporosis without current pathological fracture: Secondary | ICD-10-CM | POA: Diagnosis not present

## 2015-05-11 DIAGNOSIS — R2681 Unsteadiness on feet: Secondary | ICD-10-CM | POA: Diagnosis not present

## 2015-05-11 DIAGNOSIS — M779 Enthesopathy, unspecified: Secondary | ICD-10-CM | POA: Diagnosis not present

## 2015-05-11 DIAGNOSIS — M818 Other osteoporosis without current pathological fracture: Secondary | ICD-10-CM | POA: Diagnosis not present

## 2015-05-11 DIAGNOSIS — R262 Difficulty in walking, not elsewhere classified: Secondary | ICD-10-CM | POA: Diagnosis not present

## 2015-05-11 DIAGNOSIS — M6281 Muscle weakness (generalized): Secondary | ICD-10-CM | POA: Diagnosis not present

## 2015-05-14 DIAGNOSIS — R262 Difficulty in walking, not elsewhere classified: Secondary | ICD-10-CM | POA: Diagnosis not present

## 2015-05-14 DIAGNOSIS — R2681 Unsteadiness on feet: Secondary | ICD-10-CM | POA: Diagnosis not present

## 2015-05-14 DIAGNOSIS — M818 Other osteoporosis without current pathological fracture: Secondary | ICD-10-CM | POA: Diagnosis not present

## 2015-05-14 DIAGNOSIS — M6281 Muscle weakness (generalized): Secondary | ICD-10-CM | POA: Diagnosis not present

## 2015-05-14 DIAGNOSIS — M779 Enthesopathy, unspecified: Secondary | ICD-10-CM | POA: Diagnosis not present

## 2015-05-15 DIAGNOSIS — M779 Enthesopathy, unspecified: Secondary | ICD-10-CM | POA: Diagnosis not present

## 2015-05-15 DIAGNOSIS — M6281 Muscle weakness (generalized): Secondary | ICD-10-CM | POA: Diagnosis not present

## 2015-05-15 DIAGNOSIS — R262 Difficulty in walking, not elsewhere classified: Secondary | ICD-10-CM | POA: Diagnosis not present

## 2015-05-15 DIAGNOSIS — R2681 Unsteadiness on feet: Secondary | ICD-10-CM | POA: Diagnosis not present

## 2015-05-15 DIAGNOSIS — M818 Other osteoporosis without current pathological fracture: Secondary | ICD-10-CM | POA: Diagnosis not present

## 2015-05-17 DIAGNOSIS — R2681 Unsteadiness on feet: Secondary | ICD-10-CM | POA: Diagnosis not present

## 2015-05-17 DIAGNOSIS — M818 Other osteoporosis without current pathological fracture: Secondary | ICD-10-CM | POA: Diagnosis not present

## 2015-05-17 DIAGNOSIS — M779 Enthesopathy, unspecified: Secondary | ICD-10-CM | POA: Diagnosis not present

## 2015-05-17 DIAGNOSIS — R262 Difficulty in walking, not elsewhere classified: Secondary | ICD-10-CM | POA: Diagnosis not present

## 2015-05-17 DIAGNOSIS — M6281 Muscle weakness (generalized): Secondary | ICD-10-CM | POA: Diagnosis not present

## 2015-05-22 ENCOUNTER — Encounter: Payer: Self-pay | Admitting: Nurse Practitioner

## 2015-05-22 ENCOUNTER — Non-Acute Institutional Stay (SKILLED_NURSING_FACILITY): Payer: Medicare Other | Admitting: Nurse Practitioner

## 2015-05-22 DIAGNOSIS — G40909 Epilepsy, unspecified, not intractable, without status epilepticus: Secondary | ICD-10-CM | POA: Diagnosis not present

## 2015-05-22 DIAGNOSIS — E039 Hypothyroidism, unspecified: Secondary | ICD-10-CM | POA: Diagnosis not present

## 2015-05-22 DIAGNOSIS — M818 Other osteoporosis without current pathological fracture: Secondary | ICD-10-CM | POA: Diagnosis not present

## 2015-05-22 DIAGNOSIS — R262 Difficulty in walking, not elsewhere classified: Secondary | ICD-10-CM | POA: Diagnosis not present

## 2015-05-22 DIAGNOSIS — J9601 Acute respiratory failure with hypoxia: Secondary | ICD-10-CM | POA: Diagnosis not present

## 2015-05-22 DIAGNOSIS — F329 Major depressive disorder, single episode, unspecified: Secondary | ICD-10-CM

## 2015-05-22 DIAGNOSIS — I480 Paroxysmal atrial fibrillation: Secondary | ICD-10-CM | POA: Diagnosis not present

## 2015-05-22 DIAGNOSIS — R413 Other amnesia: Secondary | ICD-10-CM | POA: Diagnosis not present

## 2015-05-22 DIAGNOSIS — M779 Enthesopathy, unspecified: Secondary | ICD-10-CM | POA: Diagnosis not present

## 2015-05-22 DIAGNOSIS — M6281 Muscle weakness (generalized): Secondary | ICD-10-CM | POA: Diagnosis not present

## 2015-05-22 DIAGNOSIS — F32A Depression, unspecified: Secondary | ICD-10-CM

## 2015-05-22 DIAGNOSIS — R2681 Unsteadiness on feet: Secondary | ICD-10-CM | POA: Diagnosis not present

## 2015-05-22 NOTE — Assessment & Plan Note (Signed)
Sleeps well, mood is stable, continue Sertraline '50mg'$  daily, Clonazepam 0.'5mg'$  bid and q6h prn.

## 2015-05-22 NOTE — Assessment & Plan Note (Signed)
Continue Levothyroxine 111mg, 03/08/15 1.467

## 2015-05-22 NOTE — Assessment & Plan Note (Signed)
-  Secondary to pulmonary emboli -coumadin -wean oxygen to keep oxygen sat >92%

## 2015-05-22 NOTE — Assessment & Plan Note (Signed)
Last seizure 2010 02/01/15 Dilantin lever 12.4(10-20) Continue Dilantin '100mg'$  am, '200mg'$  hs

## 2015-05-22 NOTE — Progress Notes (Signed)
Patient ID: Rebecca [name], female   DOB: 14-Jul-1927, 82 y.o.   MRN: [id number]  Location:  SNF [facility] Provider:  Marlana Latus NP  Code Status:  DNR Goals of care: Advanced Directive information    Chief Complaint  Patient presents with  . Medical Management of Chronic Issues  . Acute Visit    fall 05/20/15     HPI: Patient is a 82 y.o. female seen in the SNF at [facility] today for evaluation of fell 05/20/15, no apparent injury noted, chronic SOB, improved, 93% via O2 4lmp [place], 04/19/15 started Breo Ellipta daily, scheduled Clonazepam 0.'5mg'$  bid  hospitalization 03/28/15 to 04/04/15 for bilateral pulmonary emboli. CT angiogram of the chest was obtained as part of the initial workup and revealed bilateral pulmonary emboli with indication of right heart strain on the CT. Transthoracic echocardiogram revealed largely normal left ventricular, aortic valve, and mitral valve function. Her right ventricle is mildly dilated with an elevated pulmonary artery systolic pressure (52 mmHg) consistent with her acute pulmonary emboli. TTE also showed mild LVH. EF 60-65%. No wall motion abnormalities. Grade 1 diastolic dysfunction.     S/p sepsis due to PNA, Hx of  Hypothyroidism, Memory loss, Depression, Seizure disorder ([place]), Dyslipidemia   Hospitalized from 03/11/2015-03/19/2015 for sepsis due to PNA. She presented to ED with T 102.9, Bp 80/64, RR 26, Sat O2 89%, and altered mentation. CXR showed possible bilateral pneumonia. She was started on vanco and zosyn for treatment of HCAP, completed Levaquin in SNF   Review of Systems  Constitutional: Negative for malaise/fatigue and diaphoresis.  HENT: Positive for hearing loss. Negative for congestion, ear discharge, ear pain, nosebleeds, sore throat and tinnitus.        Bilateral hearing loss. June 2015 ruptured right Tm with drainage, but little pain.  Eyes: Negative.   Respiratory: Positive for shortness of breath.        History of dyspnea on exertion   Cardiovascular: Negative.   Genitourinary: Positive for frequency.       2-3x/night  Musculoskeletal:       Osteoarthritis with complaints of pain in multiple areas. Neck and right shoulder pains. Sharp pain in the right shoulder and arm when she abducts and rotated at the shoulder [place] 05/20/15  Skin:       Senile ecchymoses  Neurological: Negative for dizziness, tingling, tremors, sensory change, speech change, focal weakness and seizures.       Hx seizures.   Endo/Heme/Allergies: Negative for environmental allergies and polydipsia.  Psychiatric/Behavioral: Negative for suicidal ideas. The patient has insomnia.        Chronic anxiety. Memory deficits.    Past Medical History  Diagnosis Date  . Osteoporosis 02/2011  . Seizures ([place])     Remotely  . DJD (degenerative joint disease)   . Diverticulosis 02/2011  . Hemorrhoids 02/2011  . Dizziness   . Low back pain   . Mitral valve problem thickened    thickened  . Unspecified hypothyroidism 01/2011  . 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 Vitamin D deficiency 01/2011  . Anemia, unspecified 03/18/2011  . Anxiety state, unspecified 01/2011  . 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 Pain in joint, site unspecified 01/2012  . Sacroiliitis, not elsewhere classified ([place]) 05/2011  . Dysuria 06/17/2011  . Other abnormal blood chemistry 0/30/2012  . Blood in stool 06/15/2012  . Other acquired deformity of toe 12/16/2011  . Corns and callosities 07/01/2011  Patient Active Problem List   Diagnosis Date Noted  . Weak 04/16/2015  . Unstable gait 04/16/2015  . Protein-calorie malnutrition, severe 04/02/2015  . Elevated troponin 03/28/2015  . Abnormal finding on EKG-new inferior/ septal TWI 03/28/2015  . DNR no code (do not resuscitate) 03/28/2015  . Acute hypoxemic respiratory failure ([place]) 03/28/2015  . DVT, femoral, acute ([place]) 03/28/2015  . Chronic pulmonary embolism ([place])  03/28/2015  . Headache 03/23/2015  . Dyslipidemia 03/12/2015  . Seizure disorder ([place]) 03/11/2015  . Depression 09/26/2014  . Memory impairment- SNF/ assited living pt 09/19/2012  . Hypothyroidism 12/08/2006    Allergies  Allergen Reactions  . Morphine And Related Other (See Comments)    Altered mental state   . Sulfa Antibiotics Other (See Comments)    Reaction: unknown    Medications: Patient's Medications  New Prescriptions   No medications on file  Previous Medications   ANTISEPTIC ORAL RINSE (BIOTENE) LIQD    15 mLs by Mouth Rinse route 4 (four) times daily as needed for dry mouth.   CALCIUM CARBONATE-VITAMIN D (CALCIUM 600+D) 600-400 MG-UNIT TABLET    Take 1 tablet by mouth daily.   CHOLECALCIFEROL (VITAMIN D-3) 5000 UNITS TABS    Take 5,000 Units by mouth daily.    CLONAZEPAM (KLONOPIN) 0.5 MG TABLET    Take 1 tablet (0.5 mg total) by mouth 2 (two) times daily as needed (anxiety).   DONEPEZIL (ARICEPT) 10 MG TABLET    Take 10 mg by mouth daily.   FEEDING SUPPLEMENT, ENSURE ENLIVE, (ENSURE ENLIVE) LIQD    Take 237 mLs by mouth 2 (two) times daily between meals.   LEVOTHYROXINE (SYNTHROID, LEVOTHROID) 100 MCG TABLET    Take 1 tablet (100 mcg total) by mouth daily before breakfast. For thyroid   MULTIPLE VITAMIN (TAB-A-VITE PO)    Take 1 tablet by mouth daily.   OMEGA-3 ACID ETHYL ESTERS (LOVAZA) 1 G CAPSULE    Take 1 g by mouth daily. Fatty Acid one daily   ONDANSETRON (ZOFRAN) 4 MG TABLET    Take 1 tablet (4 mg total) by mouth every 6 (six) hours as needed for nausea.   PHENYTOIN (DILANTIN) 100 MG ER CAPSULE    Take 1 capsule by mouth in  the morning and 2 capsules  by mouth at bedtime to prevent seizure.   SACCHAROMYCES BOULARDII (FLORASTOR) 250 MG CAPSULE    Take 250 mg by mouth 2 (two) times daily.   SERTRALINE (ZOLOFT) 50 MG TABLET    One daily to improve anxiety and depression   WARFARIN (COUMADIN) 4 MG TABLET    Take 1 tablet (4 mg total) by mouth daily at 6 PM.    Modified Medications   No medications on file  Discontinued Medications   No medications on file    Physical Exam: Filed Vitals:   05/22/15 1304  BP: 104/56  Pulse: 84  Temp: 98.7 F (37.1 C)  TempSrc: Tympanic  Resp: 18   There is no weight on file to calculate BMI.  Physical Exam  Constitutional: She is oriented to person, place, and time. She appears well-developed and well-nourished. No distress.  HENT:  Bilateral hearing loss. Rupture of the right TM.   Eyes: Conjunctivae and EOM are normal. Pupils are equal, round, and reactive to light.  Lens implants in both eyes  Neck: Normal range of motion. Neck supple. No JVD present. No tracheal deviation present. No thyromegaly present.  Cardiovascular: Normal rate, regular rhythm, normal heart sounds and intact  distal pulses.  Exam reveals no gallop and no friction rub.   No murmur heard. Pulmonary/Chest: Effort normal and breath sounds normal. No respiratory distress. She has no wheezes. She exhibits no tenderness.  Abdominal: Soft. Bowel sounds are normal. She exhibits no distension and no mass. There is no tenderness.  Genitourinary: Guaiac negative stool.  From previous exam: Hemorrhoids. Normal sphincter tone. Stools are normal brown color.  Musculoskeletal: Normal range of motion. She exhibits no edema or tenderness.  Tender in the posterior neck. Mild restriction in movements. Hammertoes at the left second and third. Right second toe overlaps the great toe. SP surgical repair of the right 2nd toe.  Shoulders sore. Painful in the right shoulder when she lifts overhead and rotates at the shpoulder.[facility] July 2014. Unstable gait. Using walker with 2 front wheels and rear skids. Weaker grip of the right hand.. Tender in the left SI joint.  Lymphadenopathy:    She has no cervical adenopathy.  Neurological: She is alert and oriented to person, place, and time. No cranial nerve deficit. Coordination normal.  No loss of grip  strength. Loss of memory. 04/04/14 MMSE 25/30. Passed clock drawing. 07/04/14 MMSE 28/30. Failed clock drawing. Bilateral tremor.  Skin: Skin is warm and dry. No rash noted. No erythema. No pallor.  Psychiatric: She has a normal mood and affect. Her behavior is normal. Thought content normal.    Labs reviewed: Basic Metabolic Panel:  Recent Labs  03/29/15 0232  03/31/15 0245 04/02/15 0553 04/03/15 0655 04/05/15  NA 137  < > 140 139 141 142  K 4.3  < > 4.2 4.1 3.9 4.8  CL 104  < > 103 103 103  --   CO2 24  < > '28 26 31  '$ --   GLUCOSE 159*  < > 90 105* 103*  --   BUN 15  < > '9 11 10 18  '$ CREATININE 0.64  < > 0.55 0.64 0.58 0.5  CALCIUM 8.3*  < > 8.8* 8.8* 8.9  --   MG 2.0  --   --  1.9  --   --   PHOS 4.2  --   --   --   --   --   < > = values in this interval not displayed.  Liver Function Tests:  Recent Labs  03/11/15 1838 03/23/15 03/29/15 1045  AST 22 26 33  ALT '15 19 25  '$ ALKPHOS 62 83 75  BILITOT 0.6  --  0.1*  PROT 6.4*  --  6.2*  ALBUMIN 3.3*  --  2.3*    CBC:  Recent Labs  01/11/15 1634  03/11/15 1838  03/28/15 1138  04/01/15 0536 04/02/15 0553 04/04/15 0519  WBC 5.5  < > 13.0*  < > 12.3*  < > 8.3 7.6 8.6  NEUTROABS 3.6  --  11.8*  --  8.3*  --   --   --   --   HGB 12.8  < > 12.5  < > 12.7  < > 11.3* 10.8* 11.6*  HCT 38.7  < > 38.3  < > 38.8  < > 35.7* 33.7* 35.9*  MCV 99.5  < > 98.2  < > 98.7  < > 98.3 99.7 99.4  PLT 195  < > 157  < > 255  < > 249 233 255  < > = values in this interval not displayed.  Lab Results  Component Value Date   TSH 1.47 03/08/2015   Lab Results  Component  Value Date   HGBA1C 5.4 03/08/2015   Lab Results  Component Value Date   CHOL 235* 03/06/2009   HDL 108.40 03/06/2009   LDLDIRECT 108.2 03/06/2009    Significant Diagnostic Results since last visit: none  Patient Care Team: Estill Dooms, MD as PCP - General (Internal Medicine) [facility] [name], MD as Consulting Physician (Orthopedic  Surgery) [name], MD as Consulting Physician (Physical Medicine and Rehabilitation) Melina Schools, MD as Consulting Physician (Orthopedic Surgery) [name], MD as Consulting Physician (Otolaryngology)  Assessment/Plan Problem List Items Addressed This Visit    Seizure disorder [place]) (Chronic)    Last seizure 2010 02/01/15 Dilantin lever 12.4(10-20) Continue Dilantin '100mg'$  am, '200mg'$  hs      Memory impairment- SNF/ assited living pt (Chronic)    03/20/15 MMSE 27/30, continue Aricept '10mg'$  daily.       Hypothyroidism (Chronic)    Continue Levothyroxine 120mg, 03/08/15 1.467      Depression (Chronic)    Sleeps well, mood is stable, continue Sertraline '50mg'$  daily, Clonazepam 0.'5mg'$  bid and q6h prn.       Acute hypoxemic respiratory failure (HCC) - Primary (Chronic)    -Secondary to pulmonary emboli -coumadin -wean oxygen to keep oxygen sat >92%           Family/ staff Communication: goal is to return to [place] when able  Labs/tests ordered: none  ME[facility] HospitalMast NP Geriatrics PSanta MargaritaGroup [address] [id number] Call:  [phone]& follow prompts after 5pm & weekends Office Phone:  [phone] Fax:  [phone]

## 2015-05-22 NOTE — Assessment & Plan Note (Signed)
03/20/15 MMSE 27/30, continue Aricept '10mg'$  daily.

## 2015-05-22 NOTE — Assessment & Plan Note (Signed)
Bilateral, continue Coumadin, INR goal 2-3

## 2015-05-23 DIAGNOSIS — R262 Difficulty in walking, not elsewhere classified: Secondary | ICD-10-CM | POA: Diagnosis not present

## 2015-05-23 DIAGNOSIS — M818 Other osteoporosis without current pathological fracture: Secondary | ICD-10-CM | POA: Diagnosis not present

## 2015-05-23 DIAGNOSIS — R2681 Unsteadiness on feet: Secondary | ICD-10-CM | POA: Diagnosis not present

## 2015-05-23 DIAGNOSIS — M6281 Muscle weakness (generalized): Secondary | ICD-10-CM | POA: Diagnosis not present

## 2015-05-23 DIAGNOSIS — M779 Enthesopathy, unspecified: Secondary | ICD-10-CM | POA: Diagnosis not present

## 2015-05-24 DIAGNOSIS — R262 Difficulty in walking, not elsewhere classified: Secondary | ICD-10-CM | POA: Diagnosis not present

## 2015-05-24 DIAGNOSIS — M779 Enthesopathy, unspecified: Secondary | ICD-10-CM | POA: Diagnosis not present

## 2015-05-24 DIAGNOSIS — M6281 Muscle weakness (generalized): Secondary | ICD-10-CM | POA: Diagnosis not present

## 2015-05-24 DIAGNOSIS — R2681 Unsteadiness on feet: Secondary | ICD-10-CM | POA: Diagnosis not present

## 2015-05-24 DIAGNOSIS — M818 Other osteoporosis without current pathological fracture: Secondary | ICD-10-CM | POA: Diagnosis not present

## 2015-05-28 DIAGNOSIS — R2681 Unsteadiness on feet: Secondary | ICD-10-CM | POA: Diagnosis not present

## 2015-05-28 DIAGNOSIS — R262 Difficulty in walking, not elsewhere classified: Secondary | ICD-10-CM | POA: Diagnosis not present

## 2015-05-28 DIAGNOSIS — M6281 Muscle weakness (generalized): Secondary | ICD-10-CM | POA: Diagnosis not present

## 2015-05-28 DIAGNOSIS — M818 Other osteoporosis without current pathological fracture: Secondary | ICD-10-CM | POA: Diagnosis not present

## 2015-05-28 DIAGNOSIS — M779 Enthesopathy, unspecified: Secondary | ICD-10-CM | POA: Diagnosis not present

## 2015-05-29 DIAGNOSIS — R262 Difficulty in walking, not elsewhere classified: Secondary | ICD-10-CM | POA: Diagnosis not present

## 2015-05-29 DIAGNOSIS — M6281 Muscle weakness (generalized): Secondary | ICD-10-CM | POA: Diagnosis not present

## 2015-05-29 DIAGNOSIS — R2681 Unsteadiness on feet: Secondary | ICD-10-CM | POA: Diagnosis not present

## 2015-05-29 DIAGNOSIS — M779 Enthesopathy, unspecified: Secondary | ICD-10-CM | POA: Diagnosis not present

## 2015-05-29 DIAGNOSIS — M818 Other osteoporosis without current pathological fracture: Secondary | ICD-10-CM | POA: Diagnosis not present

## 2015-05-31 DIAGNOSIS — M818 Other osteoporosis without current pathological fracture: Secondary | ICD-10-CM | POA: Diagnosis not present

## 2015-05-31 DIAGNOSIS — R262 Difficulty in walking, not elsewhere classified: Secondary | ICD-10-CM | POA: Diagnosis not present

## 2015-05-31 DIAGNOSIS — M6281 Muscle weakness (generalized): Secondary | ICD-10-CM | POA: Diagnosis not present

## 2015-05-31 DIAGNOSIS — R2681 Unsteadiness on feet: Secondary | ICD-10-CM | POA: Diagnosis not present

## 2015-05-31 DIAGNOSIS — M779 Enthesopathy, unspecified: Secondary | ICD-10-CM | POA: Diagnosis not present

## 2015-06-01 ENCOUNTER — Non-Acute Institutional Stay (SKILLED_NURSING_FACILITY): Payer: Medicare Other | Admitting: Nurse Practitioner

## 2015-06-01 ENCOUNTER — Encounter: Payer: Self-pay | Admitting: Nurse Practitioner

## 2015-06-01 DIAGNOSIS — I2782 Chronic pulmonary embolism: Secondary | ICD-10-CM

## 2015-06-01 DIAGNOSIS — J9601 Acute respiratory failure with hypoxia: Secondary | ICD-10-CM

## 2015-06-01 DIAGNOSIS — F329 Major depressive disorder, single episode, unspecified: Secondary | ICD-10-CM | POA: Diagnosis not present

## 2015-06-01 DIAGNOSIS — R413 Other amnesia: Secondary | ICD-10-CM | POA: Diagnosis not present

## 2015-06-01 DIAGNOSIS — F32A Depression, unspecified: Secondary | ICD-10-CM

## 2015-06-01 DIAGNOSIS — G40909 Epilepsy, unspecified, not intractable, without status epilepticus: Secondary | ICD-10-CM | POA: Diagnosis not present

## 2015-06-01 DIAGNOSIS — M549 Dorsalgia, unspecified: Secondary | ICD-10-CM | POA: Insufficient documentation

## 2015-06-01 DIAGNOSIS — M5440 Lumbago with sciatica, unspecified side: Secondary | ICD-10-CM

## 2015-06-01 DIAGNOSIS — E039 Hypothyroidism, unspecified: Secondary | ICD-10-CM

## 2015-06-01 NOTE — Assessment & Plan Note (Signed)
Last seizure 2010 02/01/15 Dilantin lever 12.4(10-20) Continue Dilantin '100mg'$  am, '200mg'$  hs

## 2015-06-01 NOTE — Assessment & Plan Note (Signed)
Continue Levothyroxine 151mg, 03/08/15 1.467

## 2015-06-01 NOTE — Assessment & Plan Note (Signed)
Bilateral, continue Coumadin, INR goal 2-3

## 2015-06-01 NOTE — Progress Notes (Signed)
Patient ID: Rebecca [name], female   DOB: Jun 22, 1927, 82 y.o.   MRN: [id number]  Location:  SNF [facility] Provider:  Marlana Latus NP  Code Status:  DNR Goals of care: Advanced Directive information    Chief Complaint  Patient presents with  . Medical Management of Chronic Issues  . Acute Visit    back pain     HPI: Patient is a 82 y.o. female seen in the SNF at [facility] today for evaluation of c/o back pain, not new, desires Tylenol daily.  04/19/15 started Breo Ellipta daily, scheduled Clonazepam 0.'5mg'$  bid  hospitalization 03/28/15 to 04/04/15 for bilateral pulmonary emboli. CT angiogram of the chest was obtained as part of the initial workup and revealed bilateral pulmonary emboli with indication of right heart strain on the CT. Transthoracic echocardiogram revealed largely normal left ventricular, aortic valve, and mitral valve function. Her right ventricle is mildly dilated with an elevated pulmonary artery systolic pressure (52 mmHg) consistent with her acute pulmonary emboli. TTE also showed mild LVH. EF 60-65%. No wall motion abnormalities. Grade 1 diastolic dysfunction.     S/p sepsis due to PNA, Hx of  Hypothyroidism, Memory loss, Depression, Seizure disorder ([place]), Dyslipidemia   Hospitalized from 03/11/2015-03/19/2015 for sepsis due to PNA. She presented to ED with T 102.9, Bp 80/64, RR 26, Sat O2 89%, and altered mentation. CXR showed possible bilateral pneumonia. She was started on vanco and zosyn for treatment of HCAP, completed Levaquin in SNF   Review of Systems  Constitutional: Negative for malaise/fatigue and diaphoresis.  HENT: Positive for hearing loss. Negative for congestion, ear discharge, ear pain, nosebleeds, sore throat and tinnitus.        Bilateral hearing loss. June 2015 ruptured right Tm with drainage, but little pain.  Eyes: Negative.   Respiratory: Positive for shortness of breath.        History of dyspnea on exertion  Cardiovascular: Negative.     Genitourinary: Positive for frequency.       2-3x/night  Musculoskeletal: Positive for back pain and joint pain.       Osteoarthritis with complaints of pain in multiple areas. Neck and right shoulder pains.  [place] 05/20/15 C/o back pain  Skin:       Senile ecchymoses  Neurological: Negative for dizziness, tingling, tremors, sensory change, speech change, focal weakness and seizures.       Hx seizures.   Endo/Heme/Allergies: Negative for environmental allergies and polydipsia.  Psychiatric/Behavioral: Negative for suicidal ideas. The patient has insomnia.        Chronic anxiety. Memory deficits.    Past Medical History  Diagnosis Date  . Osteoporosis 02/2011  . Seizures ([place])     Remotely  . DJD (degenerative joint disease)   . Diverticulosis 02/2011  . Hemorrhoids 02/2011  . Dizziness   . Low back pain   . Mitral valve problem thickened    thickened  . Unspecified hypothyroidism 01/2011  . 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 Vitamin D deficiency 01/2011  . Anemia, unspecified 03/18/2011  . Anxiety state, unspecified 01/2011  . 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 Pain in joint, site unspecified 01/2012  . Sacroiliitis, not elsewhere classified ([place]) 05/2011  . Dysuria 06/17/2011  . Other abnormal blood chemistry 0/30/2012  . Blood in stool 06/15/2012  . Other acquired deformity of toe 12/16/2011  . Corns and callosities 07/01/2011    Patient Active Problem List   Diagnosis  Date Noted  . Back pain 06/01/2015  . Weak 04/16/2015  . Unstable gait 04/16/2015  . Protein-calorie malnutrition, severe 04/02/2015  . Elevated troponin 03/28/2015  . Abnormal finding on EKG-new inferior/ septal TWI 03/28/2015  . DNR no code (do not resuscitate) 03/28/2015  . Acute hypoxemic respiratory failure ([place]) 03/28/2015  . DVT, femoral, acute ([place]) 03/28/2015  . Chronic pulmonary embolism ([place]) 03/28/2015  . Headache 03/23/2015  .  Dyslipidemia 03/12/2015  . Seizure disorder ([place]) 03/11/2015  . Depression 09/26/2014  . Memory impairment- SNF/ assited living pt 09/19/2012  . Hypothyroidism 12/08/2006    Allergies  Allergen Reactions  . Morphine And Related Other (See Comments)    Altered mental state   . Sulfa Antibiotics Other (See Comments)    Reaction: unknown    Medications: Patient's Medications  New Prescriptions   No medications on file  Previous Medications   ANTISEPTIC ORAL RINSE (BIOTENE) LIQD    15 mLs by Mouth Rinse route 4 (four) times daily as needed for dry mouth.   CALCIUM CARBONATE-VITAMIN D (CALCIUM 600+D) 600-400 MG-UNIT TABLET    Take 1 tablet by mouth daily.   CHOLECALCIFEROL (VITAMIN D-3) 5000 UNITS TABS    Take 5,000 Units by mouth daily.    CLONAZEPAM (KLONOPIN) 0.5 MG TABLET    Take 1 tablet (0.5 mg total) by mouth 2 (two) times daily as needed (anxiety).   DONEPEZIL (ARICEPT) 10 MG TABLET    Take 10 mg by mouth daily.   FEEDING SUPPLEMENT, ENSURE ENLIVE, (ENSURE ENLIVE) LIQD    Take 237 mLs by mouth 2 (two) times daily between meals.   LEVOTHYROXINE (SYNTHROID, LEVOTHROID) 100 MCG TABLET    Take 1 tablet (100 mcg total) by mouth daily before breakfast. For thyroid   MULTIPLE VITAMIN (TAB-A-VITE PO)    Take 1 tablet by mouth daily.   OMEGA-3 ACID ETHYL ESTERS (LOVAZA) 1 G CAPSULE    Take 1 g by mouth daily. Fatty Acid one daily   ONDANSETRON (ZOFRAN) 4 MG TABLET    Take 1 tablet (4 mg total) by mouth every 6 (six) hours as needed for nausea.   PHENYTOIN (DILANTIN) 100 MG ER CAPSULE    Take 1 capsule by mouth in  the morning and 2 capsules  by mouth at bedtime to prevent seizure.   SACCHAROMYCES BOULARDII (FLORASTOR) 250 MG CAPSULE    Take 250 mg by mouth 2 (two) times daily.   SERTRALINE (ZOLOFT) 50 MG TABLET    One daily to improve anxiety and depression   WARFARIN (COUMADIN) 4 MG TABLET    Take 1 tablet (4 mg total) by mouth daily at 6 PM.  Modified Medications   No medications on  file  Discontinued Medications   No medications on file    Physical Exam: Filed Vitals:   06/01/15 1139  BP: 108/74  Pulse: 80  Temp: 97.2 F (36.2 C)  TempSrc: Tympanic  Resp: 18   There is no weight on file to calculate BMI.  Physical Exam  Constitutional: She is oriented to person, place, and time. She appears well-developed and well-nourished. No distress.  HENT:  Bilateral hearing loss. Rupture of the right TM.   Eyes: Conjunctivae and EOM are normal. Pupils are equal, round, and reactive to light.  Lens implants in both eyes  Neck: Normal range of motion. Neck supple. No JVD present. No tracheal deviation present. No thyromegaly present.  Cardiovascular: Normal rate, regular rhythm, normal heart sounds and intact distal pulses.  Exam reveals no gallop and no friction rub.   No murmur heard. Pulmonary/Chest: Effort normal and breath sounds normal. No respiratory distress. She has no wheezes. She exhibits no tenderness.  Abdominal: Soft. Bowel sounds are normal. She exhibits no distension and no mass. There is no tenderness.  Genitourinary: Guaiac negative stool.  From previous exam: Hemorrhoids. Normal sphincter tone. Stools are normal brown color.  Musculoskeletal: Normal range of motion. She exhibits no edema or tenderness.  Tender in the posterior neck. Mild restriction in movements. Hammertoes at the left second and third. Right second toe overlaps the great toe. SP surgical repair of the right 2nd toe.  Shoulders sore. Painful in the right shoulder when she lifts overhead and rotates at the shpoulder.[facility] July 2014. Unstable gait. Using walker with 2 front wheels and rear skids. Weaker grip of the right hand.. Tender in the left SI joint.  Lymphadenopathy:    She has no cervical adenopathy.  Neurological: She is alert and oriented to person, place, and time. No cranial nerve deficit. Coordination normal.  No loss of grip strength. Loss of memory. 04/04/14 MMSE  25/30. Passed clock drawing. 07/04/14 MMSE 28/30. Failed clock drawing. Bilateral tremor.  Skin: Skin is warm and dry. No rash noted. No erythema. No pallor.  Psychiatric: She has a normal mood and affect. Her behavior is normal. Thought content normal.    Labs reviewed: Basic Metabolic Panel:  Recent Labs  03/29/15 0232  03/31/15 0245 04/02/15 0553 04/03/15 0655 04/05/15  NA 137  < > 140 139 141 142  K 4.3  < > 4.2 4.1 3.9 4.8  CL 104  < > 103 103 103  --   CO2 24  < > '28 26 31  '$ --   GLUCOSE 159*  < > 90 105* 103*  --   BUN 15  < > '9 11 10 18  '$ CREATININE 0.64  < > 0.55 0.64 0.58 0.5  CALCIUM 8.3*  < > 8.8* 8.8* 8.9  --   MG 2.0  --   --  1.9  --   --   PHOS 4.2  --   --   --   --   --   < > = values in this interval not displayed.  Liver Function Tests:  Recent Labs  03/11/15 1838 03/23/15 03/29/15 1045  AST 22 26 33  ALT '15 19 25  '$ ALKPHOS 62 83 75  BILITOT 0.6  --  0.1*  PROT 6.4*  --  6.2*  ALBUMIN 3.3*  --  2.3*    CBC:  Recent Labs  01/11/15 1634  03/11/15 1838  03/28/15 1138  04/01/15 0536 04/02/15 0553 04/04/15 0519  WBC 5.5  < > 13.0*  < > 12.3*  < > 8.3 7.6 8.6  NEUTROABS 3.6  --  11.8*  --  8.3*  --   --   --   --   HGB 12.8  < > 12.5  < > 12.7  < > 11.3* 10.8* 11.6*  HCT 38.7  < > 38.3  < > 38.8  < > 35.7* 33.7* 35.9*  MCV 99.5  < > 98.2  < > 98.7  < > 98.3 99.7 99.4  PLT 195  < > 157  < > 255  < > 249 233 255  < > = values in this interval not displayed.  Lab Results  Component Value Date   TSH 1.47 03/08/2015   Lab Results  Component Value Date  HGBA1C 5.4 03/08/2015   Lab Results  Component Value Date   CHOL 235* 03/06/2009   HDL 108.40 03/06/2009   LDLDIRECT 108.2 03/06/2009    Significant Diagnostic Results since last visit: none  Patient Care Team: Estill Dooms, MD as PCP - General (Internal Medicine) [facility] [name], MD as Consulting Physician (Orthopedic Surgery) [name], MD as Consulting  Physician (Physical Medicine and Rehabilitation) Melina Schools, MD as Consulting Physician (Orthopedic Surgery) [name], MD as Consulting Physician (Otolaryngology)  Assessment/Plan Problem List Items Addressed This Visit    Seizure disorder [facility]) (Chronic)    Last seizure 2010 02/01/15 Dilantin lever 12.4(10-20) Continue Dilantin '100mg'$  am, '200mg'$  hs      Memory impairment- SNF/ assited living pt (Chronic)    03/20/15 MMSE 27/30, continue Aricept '10mg'$  daily.       Hypothyroidism (Chronic)    Continue Levothyroxine 131mg, 03/08/15 1.467      Depression (Chronic)    Sleeps well, mood is stable, continue Sertraline '50mg'$  daily, Clonazepam 0.'5mg'$  bid and q6h prn      Chronic pulmonary embolism (HCC)    Bilateral, continue Coumadin, INR goal 2-3      Back pain - Primary    Will try Tylenol '650mg'$  daily, observe the patient. May obtain UA C/S, lumbar spine X-ray if worsens.       Acute hypoxemic respiratory failure (HCC) (Chronic)    -Secondary to pulmonary emboli -coumadin -wean oxygen to keep oxygen sat >92%, Sat O2 96% 2lpm [place]          Family/ staff Communication: goal is to return to AL when able  Labs/tests ordered: none  [facility] NP Geriatrics [facility] [address] [place] [id number] Call:  [phone]& follow prompts after 5pm & weekends Office Phone:  [phone] Fax:  [phone]

## 2015-06-01 NOTE — Assessment & Plan Note (Signed)
03/20/15 MMSE 27/30, continue Aricept '10mg'$  daily.

## 2015-06-01 NOTE — Assessment & Plan Note (Signed)
Will try Tylenol '650mg'$  daily, observe the patient. May obtain UA C/S, lumbar spine X-ray if worsens.

## 2015-06-01 NOTE — Assessment & Plan Note (Signed)
Sleeps well, mood is stable, continue Sertraline '50mg'$  daily, Clonazepam 0.'5mg'$  bid and q6h prn

## 2015-06-01 NOTE — Assessment & Plan Note (Signed)
-  Secondary to pulmonary emboli -coumadin -wean oxygen to keep oxygen sat >92%, Sat O2 96% 2lpm [place]

## 2015-06-04 DIAGNOSIS — I480 Paroxysmal atrial fibrillation: Secondary | ICD-10-CM | POA: Diagnosis not present

## 2015-06-04 LAB — POCT INR: INR: 2.4 — AB (ref 0.9–1.1)

## 2015-06-04 LAB — PROTIME-INR: Protime: 26.4 seconds — AB (ref 10.0–13.8)

## 2015-06-05 ENCOUNTER — Non-Acute Institutional Stay: Payer: Medicare Other | Admitting: Nurse Practitioner

## 2015-06-05 ENCOUNTER — Encounter: Payer: Self-pay | Admitting: Nurse Practitioner

## 2015-06-05 DIAGNOSIS — E039 Hypothyroidism, unspecified: Secondary | ICD-10-CM

## 2015-06-05 DIAGNOSIS — F329 Major depressive disorder, single episode, unspecified: Secondary | ICD-10-CM | POA: Diagnosis not present

## 2015-06-05 DIAGNOSIS — G40909 Epilepsy, unspecified, not intractable, without status epilepticus: Secondary | ICD-10-CM | POA: Diagnosis not present

## 2015-06-05 DIAGNOSIS — J9611 Chronic respiratory failure with hypoxia: Secondary | ICD-10-CM | POA: Diagnosis not present

## 2015-06-05 DIAGNOSIS — M5440 Lumbago with sciatica, unspecified side: Secondary | ICD-10-CM | POA: Diagnosis not present

## 2015-06-05 DIAGNOSIS — F32A Depression, unspecified: Secondary | ICD-10-CM

## 2015-06-05 DIAGNOSIS — R413 Other amnesia: Secondary | ICD-10-CM

## 2015-06-05 NOTE — Assessment & Plan Note (Signed)
Livable, toninue Tylenol '650mg'$  daily, observe the patient. May obtain UA C/S, lumbar spine X-ray if worsens.

## 2015-06-05 NOTE — Progress Notes (Signed)
Patient ID: Rebecca [name], female   DOB: 01/09/1928, 82 y.o.   MRN: [id number]  Location:  [facility] Provider:  Marlana Latus NP  Code Status:  DNR Goals of care: Advanced Directive information    Chief Complaint  Patient presents with  . Medical Management of Chronic Issues  . Acute Visit    hypoxemia     HPI: Patient is a 82 y.o. female seen in the AL at [facility] today for evaluation of Hypoxemia related to recent bilateral PE 03/28/15. Chronic back pain, managed with Tylenol. 04/19/15 started Breo Ellipta daily, scheduled Clonazepam 0.'5mg'$  bid  hospitalization 03/28/15 to 04/04/15 for bilateral pulmonary emboli. CT angiogram of the chest was obtained as part of the initial workup and revealed bilateral pulmonary emboli with indication of right heart strain on the CT. Transthoracic echocardiogram revealed largely normal left ventricular, aortic valve, and mitral valve function. Her right ventricle is mildly dilated with an elevated pulmonary artery systolic pressure (52 mmHg) consistent with her acute pulmonary emboli. TTE also showed mild LVH. EF 60-65%. No wall motion abnormalities. Grade 1 diastolic dysfunction.     S/p sepsis due to PNA, Hx of  Hypothyroidism, Memory loss, Depression, Seizure disorder ([place]), Dyslipidemia   Hospitalized from 03/11/2015-03/19/2015 for sepsis due to PNA. She presented to ED with T 102.9, Bp 80/64, RR 26, Sat O2 89%, and altered mentation. CXR showed possible bilateral pneumonia. She was started on vanco and zosyn for treatment of HCAP, completed Levaquin in SNF   Review of Systems  Constitutional: Negative for malaise/fatigue and diaphoresis.  HENT: Positive for hearing loss. Negative for congestion, ear discharge, ear pain, nosebleeds, sore throat and tinnitus.        Bilateral hearing loss. June 2015 ruptured right Tm with drainage, but little pain.  Eyes: Negative.   Respiratory: Positive for shortness of breath.        History of dyspnea on  exertion  Cardiovascular: Negative.   Genitourinary: Positive for frequency.       2-3x/night  Musculoskeletal: Positive for back pain and joint pain.       Osteoarthritis with complaints of pain in multiple areas. Neck and right shoulder pains.  [place] 05/20/15 C/o back pain  Skin:       Senile ecchymoses  Neurological: Negative for dizziness, tingling, tremors, sensory change, speech change, focal weakness and seizures.       Hx seizures.   Endo/Heme/Allergies: Negative for environmental allergies and polydipsia.  Psychiatric/Behavioral: Negative for suicidal ideas. The patient has insomnia.        Chronic anxiety. Memory deficits.    Past Medical History  Diagnosis Date  . Osteoporosis 02/2011  . Seizures ([place])     Remotely  . DJD (degenerative joint disease)   . Diverticulosis 02/2011  . Hemorrhoids 02/2011  . Dizziness   . Low back pain   . Mitral valve problem thickened    thickened  . Unspecified hypothyroidism 01/2011  . 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 Vitamin D deficiency 01/2011  . Anemia, unspecified 03/18/2011  . Anxiety state, unspecified 01/2011  . 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 Pain in joint, site unspecified 01/2012  . Sacroiliitis, not elsewhere classified ([place]) 05/2011  . Dysuria 06/17/2011  . Other abnormal blood chemistry 0/30/2012  . Blood in stool 06/15/2012  . Other acquired deformity of toe 12/16/2011  . Corns and callosities 07/01/2011    Patient Active Problem List  Diagnosis Date Noted  . Back pain 06/01/2015  . Weak 04/16/2015  . Unstable gait 04/16/2015  . Protein-calorie malnutrition, severe 04/02/2015  . Elevated troponin 03/28/2015  . Abnormal finding on EKG-new inferior/ septal TWI 03/28/2015  . DNR no code (do not resuscitate) 03/28/2015  . Hypoxemic respiratory failure, chronic ([place]) 03/28/2015  . DVT, femoral, acute ([place]) 03/28/2015  . Chronic pulmonary embolism ([place])  03/28/2015  . Headache 03/23/2015  . Dyslipidemia 03/12/2015  . Seizure disorder ([place]) 03/11/2015  . Depression 09/26/2014  . Memory impairment- SNF/ assited living pt 09/19/2012  . Hypothyroidism 12/08/2006    Allergies  Allergen Reactions  . Morphine And Related Other (See Comments)    Altered mental state   . Sulfa Antibiotics Other (See Comments)    Reaction: unknown    Medications: Patient's Medications  New Prescriptions   No medications on file  Previous Medications   ANTISEPTIC ORAL RINSE (BIOTENE) LIQD    15 mLs by Mouth Rinse route 4 (four) times daily as needed for dry mouth.   CALCIUM CARBONATE-VITAMIN D (CALCIUM 600+D) 600-400 MG-UNIT TABLET    Take 1 tablet by mouth daily.   CHOLECALCIFEROL (VITAMIN D-3) 5000 UNITS TABS    Take 5,000 Units by mouth daily.    CLONAZEPAM (KLONOPIN) 0.5 MG TABLET    Take 1 tablet (0.5 mg total) by mouth 2 (two) times daily as needed (anxiety).   DONEPEZIL (ARICEPT) 10 MG TABLET    Take 10 mg by mouth daily.   FEEDING SUPPLEMENT, ENSURE ENLIVE, (ENSURE ENLIVE) LIQD    Take 237 mLs by mouth 2 (two) times daily between meals.   LEVOTHYROXINE (SYNTHROID, LEVOTHROID) 100 MCG TABLET    Take 1 tablet (100 mcg total) by mouth daily before breakfast. For thyroid   MULTIPLE VITAMIN (TAB-A-VITE PO)    Take 1 tablet by mouth daily.   OMEGA-3 ACID ETHYL ESTERS (LOVAZA) 1 G CAPSULE    Take 1 g by mouth daily. Fatty Acid one daily   ONDANSETRON (ZOFRAN) 4 MG TABLET    Take 1 tablet (4 mg total) by mouth every 6 (six) hours as needed for nausea.   PHENYTOIN (DILANTIN) 100 MG ER CAPSULE    Take 1 capsule by mouth in  the morning and 2 capsules  by mouth at bedtime to prevent seizure.   SACCHAROMYCES BOULARDII (FLORASTOR) 250 MG CAPSULE    Take 250 mg by mouth 2 (two) times daily.   SERTRALINE (ZOLOFT) 50 MG TABLET    One daily to improve anxiety and depression   WARFARIN (COUMADIN) 4 MG TABLET    Take 1 tablet (4 mg total) by mouth daily at 6 PM.    Modified Medications   No medications on file  Discontinued Medications   No medications on file    Physical Exam: Filed Vitals:   06/05/15 1644  BP: 104/62  Pulse: 60  Temp: 98.7 F (37.1 C)  TempSrc: Tympanic  Resp: 18   There is no weight on file to calculate BMI.  Physical Exam  Constitutional: She is oriented to person, place, and time. She appears well-developed and well-nourished. No distress.  HENT:  Bilateral hearing loss. Rupture of the right TM.   Eyes: Conjunctivae and EOM are normal. Pupils are equal, round, and reactive to light.  Lens implants in both eyes  Neck: Normal range of motion. Neck supple. No JVD present. No tracheal deviation present. No thyromegaly present.  Cardiovascular: Normal rate, regular rhythm, normal heart sounds and intact distal  pulses.  Exam reveals no gallop and no friction rub.   No murmur heard. Pulmonary/Chest: Effort normal and breath sounds normal. No respiratory distress. She has no wheezes. She exhibits no tenderness.  Abdominal: Soft. Bowel sounds are normal. She exhibits no distension and no mass. There is no tenderness.  Genitourinary: Guaiac negative stool.  From previous exam: Hemorrhoids. Normal sphincter tone. Stools are normal brown color.  Musculoskeletal: Normal range of motion. She exhibits no edema or tenderness.  Tender in the posterior neck. Mild restriction in movements. Hammertoes at the left second and third. Right second toe overlaps the great toe. SP surgical repair of the right 2nd toe.  Shoulders sore. Painful in the right shoulder when she lifts overhead and rotates at the shpoulder.[facility] July 2014. Unstable gait. Using walker with 2 front wheels and rear skids. Weaker grip of the right hand.. Tender in the left SI joint.  Lymphadenopathy:    She has no cervical adenopathy.  Neurological: She is alert and oriented to person, place, and time. No cranial nerve deficit. Coordination normal.  No loss of grip  strength. Loss of memory. 04/04/14 MMSE 25/30. Passed clock drawing. 07/04/14 MMSE 28/30. Failed clock drawing. Bilateral tremor.  Skin: Skin is warm and dry. No rash noted. No erythema. No pallor.  Psychiatric: She has a normal mood and affect. Her behavior is normal. Thought content normal.    Labs reviewed: Basic Metabolic Panel:  Recent Labs  03/29/15 0232  03/31/15 0245 04/02/15 0553 04/03/15 0655 04/05/15  NA 137  < > 140 139 141 142  K 4.3  < > 4.2 4.1 3.9 4.8  CL 104  < > 103 103 103  --   CO2 24  < > '28 26 31  '$ --   GLUCOSE 159*  < > 90 105* 103*  --   BUN 15  < > '9 11 10 18  '$ CREATININE 0.64  < > 0.55 0.64 0.58 0.5  CALCIUM 8.3*  < > 8.8* 8.8* 8.9  --   MG 2.0  --   --  1.9  --   --   PHOS 4.2  --   --   --   --   --   < > = values in this interval not displayed.  Liver Function Tests:  Recent Labs  03/11/15 1838 03/23/15 03/29/15 1045  AST 22 26 33  ALT '15 19 25  '$ ALKPHOS 62 83 75  BILITOT 0.6  --  0.1*  PROT 6.4*  --  6.2*  ALBUMIN 3.3*  --  2.3*    CBC:  Recent Labs  01/11/15 1634  03/11/15 1838  03/28/15 1138  04/01/15 0536 04/02/15 0553 04/04/15 0519  WBC 5.5  < > 13.0*  < > 12.3*  < > 8.3 7.6 8.6  NEUTROABS 3.6  --  11.8*  --  8.3*  --   --   --   --   HGB 12.8  < > 12.5  < > 12.7  < > 11.3* 10.8* 11.6*  HCT 38.7  < > 38.3  < > 38.8  < > 35.7* 33.7* 35.9*  MCV 99.5  < > 98.2  < > 98.7  < > 98.3 99.7 99.4  PLT 195  < > 157  < > 255  < > 249 233 255  < > = values in this interval not displayed.  Lab Results  Component Value Date   TSH 1.47 03/08/2015   Lab Results  Component Value  Date   HGBA1C 5.4 03/08/2015   Lab Results  Component Value Date   CHOL 235* 03/06/2009   HDL 108.40 03/06/2009   LDLDIRECT 108.2 03/06/2009    Significant Diagnostic Results since last visit: none  Patient Care Team: Estill Dooms, MD as PCP - General (Internal Medicine) [facility] [name], MD as Consulting Physician (Orthopedic  Surgery) [name], MD as Consulting Physician (Physical Medicine and Rehabilitation) Melina Schools, MD as Consulting Physician (Orthopedic Surgery) [name], MD as Consulting Physician (Otolaryngology)  Assessment/Plan Problem List Items Addressed This Visit    Seizure disorder [facility]) (Chronic)    Last seizure 2010 02/01/15 Dilantin lever 12.4(10-20) Continue Dilantin '100mg'$  am, '200mg'$  hs      Memory impairment- SNF/ assited living pt (Chronic)    03/20/15 MMSE 27/30, continue Aricept '10mg'$  daily.       Hypoxemic respiratory failure, chronic ([place]) - Primary    -Secondary to pulmonary emboli -coumadin -wean oxygen to keep oxygen sat >92% -continue Breo Ellipta '75mg'$  qd       Hypothyroidism (Chronic)    Continue Levothyroxine 164mg, 03/08/15 TSH 1.467      Depression (Chronic)    Sleeps well, mood is stable, continue Sertraline '75mg'$  daily, Clonazepam 0.'5mg'$  bid, observe.       Back pain    Livable, toninue Tylenol '650mg'$  daily, observe the patient. May obtain UA C/S, lumbar spine X-ray if worsens.          Family/ staff Communication: AL for care needs.   Labs/tests ordered: none  ManXie Wylie Russon NP Geriatrics [facility] [address] [place] [id number] Call:  [phone]& follow prompts after 5pm & weekends Office Phone:  [phone] Fax:  [phone]

## 2015-06-05 NOTE — Assessment & Plan Note (Signed)
03/20/15 MMSE 27/30, continue Aricept '10mg'$  daily.

## 2015-06-05 NOTE — Assessment & Plan Note (Signed)
Continue Levothyroxine 174mg, 03/08/15 TSH 1.467

## 2015-06-05 NOTE — Assessment & Plan Note (Signed)
Last seizure 2010 02/01/15 Dilantin lever 12.4(10-20) Continue Dilantin '100mg'$  am, '200mg'$  hs

## 2015-06-05 NOTE — Assessment & Plan Note (Addendum)
-  Secondary to pulmonary emboli -coumadin -wean oxygen to keep oxygen sat >92% -continue Breo Ellipta '75mg'$  qd

## 2015-06-05 NOTE — Assessment & Plan Note (Signed)
Sleeps well, mood is stable, continue Sertraline '75mg'$  daily, Clonazepam 0.'5mg'$  bid, observe.

## 2015-06-12 ENCOUNTER — Ambulatory Visit
Admission: RE | Admit: 2015-06-12 | Discharge: 2015-06-12 | Disposition: A | Discharge status: Home / Self Care | Payer: Self-pay | Source: Ambulatory Visit | Attending: Interventional Radiology | Admitting: Interventional Radiology

## 2015-06-12 ENCOUNTER — Other Ambulatory Visit: Payer: Self-pay | Admitting: Interventional Radiology

## 2015-06-12 ENCOUNTER — Ambulatory Visit
Admission: RE | Admit: 2015-06-12 | Discharge: 2015-06-12 | Disposition: A | Discharge status: Home / Self Care | Payer: Medicare Other | Source: Ambulatory Visit | Attending: Interventional Radiology | Admitting: Interventional Radiology

## 2015-06-12 DIAGNOSIS — Z95828 Presence of other vascular implants and grafts: Secondary | ICD-10-CM

## 2015-06-12 DIAGNOSIS — Z86718 Personal history of other venous thrombosis and embolism: Secondary | ICD-10-CM | POA: Diagnosis not present

## 2015-06-12 NOTE — Progress Notes (Signed)
Chief Complaint: Patient was seen in consultation today for presence of IVC filter at the request of [place]  Referring Physician(s): McCullough,Heath  History of Present Illness: Rebecca [name] is a 82 y.o. female who was admitted with pneumonia and sepsis and October 2016. Upon discharge she had significantly decreased ambulation during her recovery at the  skilled nursing facility.  On 03/28/2015 she developed hypoxia and shortness of breath and was brought back to the emergency room where she was diagnosed with submassive pulmonary embolus. Ultrasound evaluation demonstrated residual and mobile thrombus within the left common femoral vein. She was not an optimal lysis candidate. Given the presence of mobile thrombus and submassive pulmonary embolus with right heart strain the decision was made to place a temporary IVC filter for dual prophylaxis.  Rebecca [name] presents today with her son accompanying her to evaluate for potential filter retrieval.  She has been tolerating Coumadin anticoagulation successfully since her discharge in November. She is currently on home oxygen but is able to ambulate and her functional status has significantly improved.   Ultrasound evaluation today demonstrates no residual DVT.    Past Medical History  Diagnosis Date  . Osteoporosis 02/2011  . Seizures ([place])     Remotely  . DJD (degenerative joint disease)   . Diverticulosis 02/2011  . Hemorrhoids 02/2011  . Dizziness   . Low back pain   . Mitral valve problem thickened    thickened  . Unspecified hypothyroidism 01/2011  . 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 Vitamin D deficiency 01/2011  . Anemia, unspecified 03/18/2011  . Anxiety state, unspecified 01/2011  . 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 Pain in joint, site unspecified 01/2012  . Sacroiliitis, not elsewhere classified ([place]) 05/2011  . Dysuria 06/17/2011  . Other  abnormal blood chemistry 0/30/2012  . Blood in stool 06/15/2012  . Other acquired deformity of toe 12/16/2011  . Corns and callosities 07/01/2011    Past Surgical History  Procedure Laterality Date  . Cervical laminectomy  2005  . Bletheroplasty    . Hammer toe surgery  2013    right 2nd toe Dr. Mallie Mussel  . Joint replacement Bilateral 2002 Right, 1992 Left    knees  . Cataract extraction w/ intraocular lens  implant, bilateral  2010    Allergies: Morphine and related and Sulfa antibiotics  Medications: Prior to Admission medications   Medication Sig Start Date End Date Taking? Authorizing Provider  antiseptic oral rinse (BIOTENE) LIQD 15 mLs by Mouth Rinse route 4 (four) times daily as needed for dry mouth.    Historical Provider, MD  Calcium Carbonate-Vitamin D (CALCIUM 600+D) 600-400 MG-UNIT tablet Take 1 tablet by mouth daily.    Historical Provider, MD  Cholecalciferol (VITAMIN D-3) 5000 UNITS TABS Take 5,000 Units by mouth daily.     Historical Provider, MD  clonazePAM (KLONOPIN) 0.5 MG tablet Take 1 tablet (0.5 mg total) by mouth 2 (two) times daily as needed (anxiety). 03/17/15   Robbie Lis, MD  donepezil (ARICEPT) 10 MG tablet Take 10 mg by mouth daily.    Historical Provider, MD  feeding supplement, ENSURE ENLIVE, (ENSURE ENLIVE) LIQD Take 237 mLs by mouth 2 (two) times daily between meals. 04/02/15   Orson Eva, MD  levothyroxine (SYNTHROID, LEVOTHROID) 100 MCG tablet Take 1 tablet (100 mcg total) by mouth daily before breakfast. For thyroid 09/28/14   Estill Dooms, MD  Multiple Vitamin (TAB-A-VITE PO) Take  1 tablet by mouth daily.    Historical Provider, MD  omega-3 acid ethyl esters (LOVAZA) 1 G capsule Take 1 g by mouth daily. Fatty Acid one daily    Historical Provider, MD  ondansetron (ZOFRAN) 4 MG tablet Take 1 tablet (4 mg total) by mouth every 6 (six) hours as needed for nausea. 01/12/15   Venetia Maxon Rama, MD  phenytoin (DILANTIN) 100 MG ER capsule Take 1 capsule by  mouth in  the morning and 2 capsules  by mouth at bedtime to prevent seizure. Patient taking differently: Take 100-200 mg by mouth 2 (two) times daily. Take 100 mg in the morning and 200 mg by mouth at bedtime 09/26/14   Estill Dooms, MD  saccharomyces boulardii (FLORASTOR) 250 MG capsule Take 250 mg by mouth 2 (two) times daily.    Historical Provider, MD  sertraline (ZOLOFT) 50 MG tablet One daily to improve anxiety and depression Patient taking differently: Take 25 mg by mouth daily.  01/30/15   Estill Dooms, MD  warfarin (COUMADIN) 4 MG tablet Take 1 tablet (4 mg total) by mouth daily at 6 PM. 04/03/15   Orson Eva, MD     Family History  Problem Relation Age of Onset  . Alzheimer's disease Sister   . Emphysema Sister     Social History   Social History  . Marital Status: Widowed    Spouse Name: N/A  . Number of Children: N/A  . Years of Education: N/A   Social History Main Topics  . Smoking status: Former Smoker    Quit date: 09/14/1988  . Smokeless tobacco: Never Used     Comment: Smoked <1ppd  . Alcohol Use: 0.6 oz/week    1 Glasses of wine per week     Comment: doesn't drink routinely & only on social occasions  . Drug Use: No  . Sexual Activity: No   Other Topics Concern  . Not on file   Social History Narrative   Lives at [facility] since 10/07/2010, moved to [place] 01/16/2015   Widowed   Exercise class 3 times a week     Never smoked   Alcholol wine social    POA, Living Will      [facility]:   Primary MPOA:  Christell Faith (Daughter) - [phone]   Secondary MPOA:  Eira Alpert (Son) - [phone]    Review of Systems: A 12 point ROS discussed and pertinent positives are indicated in the HPI above.  All other systems are negative.  Review of Systems  Vital Signs: There were no vitals taken for this visit.  Physical Exam  Constitutional: She appears well-developed and well-nourished. No distress.  HENT:  Head: Normocephalic and  atraumatic.  Eyes: No scleral icterus.  Cardiovascular: Normal rate.   Pulmonary/Chest: Effort normal.  Musculoskeletal: She exhibits no edema.  Neurological: She is alert.  Skin: Skin is warm and dry.  Nursing note and vitals reviewed.   Imaging: No results found.  Labs:  CBC:  Recent Labs  03/31/15 0245 04/01/15 0536 04/02/15 0553 04/04/15 0519  WBC 9.0 8.3 7.6 8.6  HGB 11.0* 11.3* 10.8* 11.6*  HCT 35.1* 35.7* 33.7* 35.9*  PLT 260 249 233 255    COAGS:  Recent Labs  03/11/15 2350 03/28/15 1137 03/28/15 1913 03/29/15 0232  04/01/15 0536 04/02/15 0553 04/03/15 0655 04/04/15 0519  INR 1.17 1.13 1.20 1.22  < > 1.29 1.87* 1.95* 2.18*  APTT 37 36 73* 98*  --   --   --   --   --   < > =  values in this interval not displayed.  BMP:  Recent Labs  03/30/15 0505 03/31/15 0245 04/02/15 0553 04/03/15 0655 04/05/15  NA 142 140 139 141 142  K 3.9 4.2 4.1 3.9 4.8  CL 104 103 103 103  --   CO2 '30 28 26 31  '$ --   GLUCOSE 83 90 105* 103*  --   BUN '10 9 11 10 18  '$ CALCIUM 8.8* 8.8* 8.8* 8.9  --   CREATININE 0.56 0.55 0.64 0.58 0.5  GFRNONAA >60 >60 >60 >60  --   GFRAA >60 >60 >60 >60  --     LIVER FUNCTION TESTS:  Recent Labs  03/08/15 03/11/15 1838 03/23/15 03/29/15 1045  BILITOT  --  0.6  --  0.1*  AST '20 22 26 '$ 33  ALT '13 15 19 25  '$ ALKPHOS 74 62 83 75  PROT  --  6.4*  --  6.2*  ALBUMIN  --  3.3*  --  2.3*    TUMOR MARKERS: No results for input(s): AFPTM, CEA, CA199, CHROMGRNA in the last 8760 hours.  Assessment and Plan:   Ultrasound today of the bilateral lower extremities shows no evidence of residual or recurrent DVT. Patient is in assisted living but has recovered from. Massive pulmonary embolus. She is tolerating her Coumadin well and has been effectively anticoagulated for 3 months.  1.) 3 months anticoagulation for provoked (pneumonia and decreased mobility x 16 days) DVT with PE completed.  Anticoagulation can likely be safely stopped at any  time moving forward.  2.) No evidence of residual DVT. Temporary caval interruption is no longer required. I will schedule Rebecca [name] for out-pt IVC filter retrieval.  I discussed this at length with both her and her son who are in agreement with this plan.   Electronically Signed: [place], [place] 06/12/2015, 1:16 PM   I spent a total of  15 Minutes in face to face in clinical consultation, greater than 50% of which was counseling/coordinating care for IVC filter and DVT

## 2015-07-02 DIAGNOSIS — Z7901 Long term (current) use of anticoagulants: Secondary | ICD-10-CM | POA: Diagnosis not present

## 2015-07-03 ENCOUNTER — Encounter: Payer: Self-pay | Admitting: Nurse Practitioner

## 2015-07-03 ENCOUNTER — Non-Acute Institutional Stay: Payer: Medicare Other | Admitting: Nurse Practitioner

## 2015-07-03 DIAGNOSIS — I82412 Acute embolism and thrombosis of left femoral vein: Secondary | ICD-10-CM | POA: Diagnosis not present

## 2015-07-03 DIAGNOSIS — G40909 Epilepsy, unspecified, not intractable, without status epilepticus: Secondary | ICD-10-CM | POA: Diagnosis not present

## 2015-07-03 DIAGNOSIS — E039 Hypothyroidism, unspecified: Secondary | ICD-10-CM

## 2015-07-03 DIAGNOSIS — J9611 Chronic respiratory failure with hypoxia: Secondary | ICD-10-CM | POA: Diagnosis not present

## 2015-07-03 DIAGNOSIS — I2782 Chronic pulmonary embolism: Secondary | ICD-10-CM | POA: Diagnosis not present

## 2015-07-03 DIAGNOSIS — R413 Other amnesia: Secondary | ICD-10-CM

## 2015-07-03 DIAGNOSIS — F329 Major depressive disorder, single episode, unspecified: Secondary | ICD-10-CM | POA: Diagnosis not present

## 2015-07-03 DIAGNOSIS — M5440 Lumbago with sciatica, unspecified side: Secondary | ICD-10-CM

## 2015-07-03 DIAGNOSIS — F32A Depression, unspecified: Secondary | ICD-10-CM

## 2015-07-03 NOTE — Assessment & Plan Note (Signed)
Venous duplex of the lower extremities revealed a DVT in the left proximal femoral vein and DVT in the bilateral posterior temporal, peroneal, and soleal veins. On 03/29/2015, infrarenal IVC filter was placed by IR.

## 2015-07-03 NOTE — Assessment & Plan Note (Signed)
Sleeps well, mood is stable, continue Sertraline '75mg'$  daily, Clonazepam 0.'5mg'$  bid, observe.

## 2015-07-03 NOTE — Assessment & Plan Note (Signed)
-  Secondary to pulmonary emboli -coumadin -wean oxygen to keep oxygen sat >92% -continue Breo Ellipta '75mg'$  qd

## 2015-07-03 NOTE — Assessment & Plan Note (Signed)
Bilateral, continue Coumadin, INR goal 2-3

## 2015-07-03 NOTE — Progress Notes (Signed)
Patient ID: Rebecca [name], female   DOB: 26-Dec-1927, 82 y.o.   MRN: [id number]  [facility] Provider:  Marlana Latus NP  Code Status:  DNR Goals of care: Advanced Directive information Does patient have an advance directive?: Yes, Type of Advance Directive: [place];Out of facility DNR (pink MOST or yellow form)  Chief Complaint  Patient presents with  . Medical Management of Chronic Issues    Routine Visit     HPI: Patient is a 82 y.o. female seen in the [place] at [facility] today for evaluation of Hypoxemia related to recent bilateral PE 03/28/15. Chronic back pain, managed with Tylenol. 04/19/15 started Breo Ellipta daily, scheduled Clonazepam 0.'5mg'$  bid  hospitalization 03/28/15 to 04/04/15 for bilateral pulmonary emboli. CT angiogram of the chest was obtained as part of the initial workup and revealed bilateral pulmonary emboli with indication of right heart strain on the CT. Transthoracic echocardiogram revealed largely normal left ventricular, aortic valve, and mitral valve function. Her right ventricle is mildly dilated with an elevated pulmonary artery systolic pressure (52 mmHg) consistent with her acute pulmonary emboli. TTE also showed mild LVH. EF 60-65%. No wall motion abnormalities. Grade 1 diastolic dysfunction.     S/p sepsis due to PNA, Hx of  Hypothyroidism, Memory loss, Depression, Seizure disorder ([place]), Dyslipidemia   Hospitalized from 03/11/2015-03/19/2015 for sepsis due to PNA. She presented to ED with T 102.9, Bp 80/64, RR 26, Sat O2 89%, and altered mentation. CXR showed possible bilateral pneumonia. She was started on vanco and zosyn for treatment of HCAP, completed Levaquin in SNF   Review of Systems  Constitutional: Negative for malaise/fatigue and diaphoresis.  HENT: Positive for hearing loss. Negative for congestion, ear discharge, ear pain, nosebleeds, sore throat and tinnitus.        Bilateral hearing loss. June 2015 ruptured right  Tm with drainage, but little pain.  Eyes: Negative.   Respiratory: Positive for shortness of breath.        History of dyspnea on exertion  Cardiovascular: Negative.   Genitourinary: Positive for frequency.       2-3x/night  Musculoskeletal: Positive for back pain and joint pain.       Osteoarthritis with complaints of pain in multiple areas. Neck and right shoulder pains.  [place] 05/20/15 C/o back pain  Skin:       Senile ecchymoses  Neurological: Negative for dizziness, tingling, tremors, sensory change, speech change, focal weakness and seizures.       Hx seizures.   Endo/Heme/Allergies: Negative for environmental allergies and polydipsia.  Psychiatric/Behavioral: Negative for suicidal ideas. The patient has insomnia.        Chronic anxiety. Memory deficits.    Past Medical History  Diagnosis Date  . Osteoporosis 02/2011  . Seizures ([place])     Remotely  . DJD (degenerative joint disease)   . Diverticulosis 02/2011  . Hemorrhoids 02/2011  . Dizziness   . Low back pain   . Mitral valve problem thickened    thickened  . Unspecified hypothyroidism 01/2011  . 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 Vitamin D deficiency 01/2011  . Anemia, unspecified 03/18/2011  . Anxiety state, unspecified 01/2011  . 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 Pain in joint, site unspecified 01/2012  . Sacroiliitis, not elsewhere classified ([place]) 05/2011  . Dysuria 06/17/2011  . Other abnormal blood chemistry 0/30/2012  . Blood in stool 06/15/2012  . Other acquired  deformity of toe 12/16/2011  . Corns and callosities 07/01/2011    Patient Active Problem List   Diagnosis Date Noted  . Back pain 06/01/2015  . Weak 04/16/2015  . Unstable gait 04/16/2015  . Protein-calorie malnutrition, severe 04/02/2015  . Elevated troponin 03/28/2015  . Abnormal finding on EKG-new inferior/ septal TWI 03/28/2015  . DNR no code (do not resuscitate) 03/28/2015  .  Hypoxemic respiratory failure, chronic ([place]) 03/28/2015  . DVT, femoral, acute ([place]) 03/28/2015  . Chronic pulmonary embolism ([place]) 03/28/2015  . Headache 03/23/2015  . Dyslipidemia 03/12/2015  . Seizure disorder ([place]) 03/11/2015  . Depression 09/26/2014  . Memory impairment- SNF/ assited living pt 09/19/2012  . Hypothyroidism 12/08/2006    Allergies  Allergen Reactions  . Morphine And Related Other (See Comments)    Altered mental state   . Sulfa Antibiotics Other (See Comments)    Reaction: unknown    Medications: Patient's Medications  New Prescriptions   No medications on file  Previous Medications   ACETAMINOPHEN (TYLENOL) 325 MG TABLET    Take 650 mg by mouth every morning.   CALCIUM CARBONATE-VITAMIN D (CALCIUM 600+D) 600-400 MG-UNIT TABLET    Take 1 tablet by mouth daily.   CHOLECALCIFEROL (VITAMIN D-3) 5000 UNITS TABS    Take 5,000 Units by mouth daily.    CLONAZEPAM (KLONOPIN) 0.5 MG TABLET    Take 1 tablet (0.5 mg total) by mouth 2 (two) times daily as needed (anxiety).   DONEPEZIL (ARICEPT) 10 MG TABLET    Take 10 mg by mouth daily.   FEEDING SUPPLEMENT (BOOST / RESOURCE BREEZE) LIQD    Take 1 Container by mouth 2 (two) times daily between meals.   FLUTICASONE FUROATE-VILANTEROL (BREO ELLIPTA) 100-25 MCG/INH AEPB    Inhale 1 puff into the lungs daily.   LEVOTHYROXINE (SYNTHROID, LEVOTHROID) 100 MCG TABLET    Take 1 tablet (100 mcg total) by mouth daily before breakfast. For thyroid   MULTIPLE VITAMIN (TAB-A-VITE PO)    Take 1 tablet by mouth daily.   OMEGA-3 ACID ETHYL ESTERS (LOVAZA) 1 G CAPSULE    Take 1 g by mouth daily. Fatty Acid one daily   PHENYTOIN (DILANTIN) 100 MG ER CAPSULE    Take 1 capsule by mouth in  the morning and 2 capsules  by mouth at bedtime to prevent seizure.   SERTRALINE (ZOLOFT) 25 MG TABLET    Take 3 tablets by mouth daily ('75MG'$ )   WARFARIN (COUMADIN) 6 MG TABLET    Take 6 mg by mouth at bedtime.  Modified Medications   No medications on  file  Discontinued Medications   ANTISEPTIC ORAL RINSE (BIOTENE) LIQD    15 mLs by Mouth Rinse route 4 (four) times daily as needed for dry mouth.   FEEDING SUPPLEMENT, ENSURE ENLIVE, (ENSURE ENLIVE) LIQD    Take 237 mLs by mouth 2 (two) times daily between meals.   ONDANSETRON (ZOFRAN) 4 MG TABLET    Take 1 tablet (4 mg total) by mouth every 6 (six) hours as needed for nausea.   SACCHAROMYCES BOULARDII (FLORASTOR) 250 MG CAPSULE    Take 250 mg by mouth 2 (two) times daily.   SERTRALINE (ZOLOFT) 50 MG TABLET    One daily to improve anxiety and depression   WARFARIN (COUMADIN) 4 MG TABLET    Take 1 tablet (4 mg total) by mouth daily at 6 PM.    Physical Exam: Filed Vitals:   07/03/15 1140  BP: 134/79  Pulse: 61  Temp: 98.2 F (36.8 C)  TempSrc: Oral  Resp: 19  Height: 5' (1.524 m)  Weight: 120 lb (54.432 kg)   Body mass index is 23.44 kg/(m^2).  Physical Exam  Constitutional: She is oriented to person, place, and time. She appears well-developed and well-nourished. No distress.  HENT:  Bilateral hearing loss. Rupture of the right TM.   Eyes: Conjunctivae and EOM are normal. Pupils are equal, round, and reactive to light.  Lens implants in both eyes  Neck: Normal range of motion. Neck supple. No JVD present. No tracheal deviation present. No thyromegaly present.  Cardiovascular: Normal rate, regular rhythm, normal heart sounds and intact distal pulses.  Exam reveals no gallop and no friction rub.   No murmur heard. Pulmonary/Chest: Effort normal and breath sounds normal. No respiratory distress. She has no wheezes. She exhibits no tenderness.  Abdominal: Soft. Bowel sounds are normal. She exhibits no distension and no mass. There is no tenderness.  Genitourinary: Guaiac negative stool.  From previous exam: Hemorrhoids. Normal sphincter tone. Stools are normal brown color.  Musculoskeletal: Normal range of motion. She exhibits no edema or tenderness.  Tender in the posterior neck.  Mild restriction in movements. Hammertoes at the left second and third. Right second toe overlaps the great toe. SP surgical repair of the right 2nd toe.  Shoulders sore. Painful in the right shoulder when she lifts overhead and rotates at the [place]oulder.[facility] July 2014. Unstable gait. Using walker with 2 front wheels and rear skids. Weaker grip of the right hand.. Tender in the left SI joint.  Lymphadenopathy:    She has no cervical adenopathy.  Neurological: She is alert and oriented to person, place, and time. No cranial nerve deficit. Coordination normal.  No loss of grip strength. Loss of memory. 04/04/14 MMSE 25/30. Passed clock drawing. 07/04/14 MMSE 28/30. Failed clock drawing. Bilateral tremor.  Skin: Skin is warm and dry. No rash noted. No erythema. No pallor.  Psychiatric: She has a normal mood and affect. Her behavior is normal. Thought content normal.    Labs reviewed: Basic Metabolic Panel:  Recent Labs  03/29/15 0232  03/31/15 0245 04/02/15 0553 04/03/15 0655 04/05/15  NA 137  < > 140 139 141 142  K 4.3  < > 4.2 4.1 3.9 4.8  CL 104  < > 103 103 103  --   CO2 24  < > '28 26 31  '$ --   GLUCOSE 159*  < > 90 105* 103*  --   BUN 15  < > '9 11 10 18  '$ CREATININE 0.64  < > 0.55 0.64 0.58 0.5  CALCIUM 8.3*  < > 8.8* 8.8* 8.9  --   MG 2.0  --   --  1.9  --   --   PHOS 4.2  --   --   --   --   --   < > = values in this interval not displayed.  Liver Function Tests:  Recent Labs  03/11/15 1838 03/23/15 03/29/15 1045  AST 22 26 33  ALT '15 19 25  '$ ALKPHOS 62 83 75  BILITOT 0.6  --  0.1*  PROT 6.4*  --  6.2*  ALBUMIN 3.3*  --  2.3*    CBC:  Recent Labs  01/11/15 1634  03/11/15 1838  03/28/15 1138  04/01/15 0536 04/02/15 0553 04/04/15 0519  WBC 5.5  < > 13.0*  < > 12.3*  < > 8.3 7.6 8.6  NEUTROABS 3.6  --  11.8*  --  8.3*  --   --   --   --   HGB 12.8  < > 12.5  < > 12.7  < > 11.3* 10.8* 11.6*  HCT 38.7  < > 38.3  < > 38.8  < > 35.7* 33.7* 35.9*  MCV 99.5  < >  98.2  < > 98.7  < > 98.3 99.7 99.4  PLT 195  < > 157  < > 255  < > 249 233 255  < > = values in this interval not displayed.  Lab Results  Component Value Date   TSH 1.47 03/08/2015   Lab Results  Component Value Date   HGBA1C 5.4 03/08/2015   Lab Results  Component Value Date   CHOL 235* 03/06/2009   HDL 108.40 03/06/2009   LDLDIRECT 108.2 03/06/2009    Significant Diagnostic Results since last visit: none  Patient Care Team: Estill Dooms, MD as PCP - General (Internal Medicine) [facility] [name], MD as Consulting Physician (Orthopedic Surgery) [name], MD as Consulting Physician (Physical Medicine and Rehabilitation) Melina Schools, MD as Consulting Physician (Orthopedic Surgery) [name], MD as Consulting Physician (Otolaryngology)  Assessment/Plan Problem List Items Addressed This Visit    Hypothyroidism - Primary (Chronic)    Continue Levothyroxine 146mg, 03/08/15 TSH 1.467      Memory impairment- SNF/ assited living pt (Chronic)    03/20/15 MMSE 27/30, continue Aricept '10mg'$  daily.       Depression (Chronic)    Sleeps well, mood is stable, continue Sertraline '75mg'$  daily, Clonazepam 0.'5mg'$  bid, observe.       Relevant Medications   sertraline (ZOLOFT) 25 MG tablet   Seizure disorder (HCC) (Chronic)    Last seizure 2010 02/01/15 Dilantin lever 12.4(10-20) Continue Dilantin '100mg'$  am, '200mg'$  hs      DVT, femoral, acute (HCC) (Chronic)    Venous duplex of the lower extremities revealed a DVT in the left proximal femoral vein and DVT in the bilateral posterior temporal, peroneal, and soleal veins. On 03/29/2015, infrarenal IVC filter was placed by IR.      Relevant Medications   warfarin (COUMADIN) 6 MG tablet   Hypoxemic respiratory failure, chronic (HCC)    -Secondary to pulmonary emboli -coumadin -wean oxygen to keep oxygen sat >92% -continue Breo Ellipta '75mg'$  qd       Chronic pulmonary embolism (HCC)    Bilateral, continue  Coumadin, INR goal 2-3      Relevant Medications   warfarin (COUMADIN) 6 MG tablet   Back pain    Livable, toninue Tylenol '650mg'$  daily, observe the patient. May obtain UA C/S, lumbar spine X-ray if worsens.      Relevant Medications   acetaminophen (TYLENOL) 325 MG tablet       Family/ staff Communication: [place] for care needs.   Labs/tests ordered: none  ManXie Dorethia Jeanmarie NP [facility] [address] [place] [id number] Call:  [phone]& follow prompts after 5pm & weekends Office Phone:  [phone] Fax:  [phone]

## 2015-07-03 NOTE — Assessment & Plan Note (Signed)
Continue Levothyroxine 138mg, 03/08/15 TSH 1.467

## 2015-07-03 NOTE — Assessment & Plan Note (Signed)
Last seizure 2010 02/01/15 Dilantin lever 12.4(10-20) Continue Dilantin '100mg'$  am, '200mg'$  hs

## 2015-07-03 NOTE — Assessment & Plan Note (Signed)
03/20/15 MMSE 27/30, continue Aricept '10mg'$  daily.

## 2015-07-03 NOTE — Assessment & Plan Note (Signed)
Livable, toninue Tylenol '650mg'$  daily, observe the patient. May obtain UA C/S, lumbar spine X-ray if worsens.

## 2015-07-05 DIAGNOSIS — I5041 Acute combined systolic (congestive) and diastolic (congestive) heart failure: Secondary | ICD-10-CM | POA: Diagnosis not present

## 2015-07-10 ENCOUNTER — Non-Acute Institutional Stay: Payer: Medicare Other | Admitting: Internal Medicine

## 2015-07-10 ENCOUNTER — Encounter: Payer: Self-pay | Admitting: Internal Medicine

## 2015-07-10 VITALS — BP 110/70 | HR 75 | Temp 97.3°F | Resp 20 | Ht 60.0 in | Wt 126.0 lb

## 2015-07-10 DIAGNOSIS — J9611 Chronic respiratory failure with hypoxia: Secondary | ICD-10-CM | POA: Diagnosis not present

## 2015-07-10 DIAGNOSIS — I82412 Acute embolism and thrombosis of left femoral vein: Secondary | ICD-10-CM | POA: Diagnosis not present

## 2015-07-10 DIAGNOSIS — I2782 Chronic pulmonary embolism: Secondary | ICD-10-CM | POA: Diagnosis not present

## 2015-07-10 DIAGNOSIS — R2681 Unsteadiness on feet: Secondary | ICD-10-CM | POA: Diagnosis not present

## 2015-07-10 DIAGNOSIS — R413 Other amnesia: Secondary | ICD-10-CM | POA: Diagnosis not present

## 2015-07-10 MED ORDER — APIXABAN 5 MG PO TABS: ORAL_TABLET | ORAL | Status: DC

## 2015-07-10 NOTE — Progress Notes (Signed)
Patient ID: Rebecca [name], female   DOB: [birth year], 82 y.o.   MRN: [id number]    [place] Room Number: Al 36  Place of Service: SNF (31)     Allergies  Allergen Reactions  . Morphine And Related Other (See Comments)    Altered mental state   . Sulfa Antibiotics Other (See Comments)    Reaction: unknown    Chief Complaint  Patient presents with  . Medical Management of Chronic Issues    questions about coumadin and sore on her left shin.    HPI:  Acute deep vein thrombosis (DVT) of femoral vein of left lower extremity ([place]) - this occurred during her last hospitalization 03/28/2015 for pneumonia.  Unstable gait - using a walker -   Other chronic pulmonary embolism without acute cor pulmonale (HCC) - patient has Greenfield filter .she recently had ad vice that this could be removed   Hypoxemic respiratory failure, chronic (HCC) - chronic oxygen therapy. Lung damage during her last hospital stay may be improving. She is not good about leaving her oxygen in place. Her son, who is with her today, reports that her O2 saturations typically are around 90 off oxygen .  Memory impairment- SNF/ assited living pt -  Unchanged. I am skeptical that she will ever be able to safely return to independent living .    Medications: Patient's Medications  New Prescriptions   No medications on file  Previous Medications   ACETAMINOPHEN (TYLENOL) 325 MG TABLET    Take 650 mg by mouth every morning.   CALCIUM CARBONATE-VITAMIN D (CALCIUM 600+D) 600-400 MG-UNIT TABLET    Take 1 tablet by mouth daily.   CHOLECALCIFEROL (VITAMIN D-3) 5000 UNITS TABS    Take 5,000 Units by mouth daily.    CLONAZEPAM (KLONOPIN) 0.5 MG TABLET    Take 1 tablet (0.5 mg total) by mouth 2 (two) times daily as needed (anxiety).   DONEPEZIL (ARICEPT) 10 MG TABLET    Take 10 mg by mouth daily. Reported on 07/10/2015   FEEDING SUPPLEMENT (BOOST / RESOURCE BREEZE) LIQD    Take 1 Container by  mouth 2 (two) times daily between meals.   FLUTICASONE FUROATE-VILANTEROL (BREO ELLIPTA) 100-25 MCG/INH AEPB    Inhale 1 puff into the lungs daily.   LEVOTHYROXINE (SYNTHROID, LEVOTHROID) 100 MCG TABLET    Take 1 tablet (100 mcg total) by mouth daily before breakfast. For thyroid   MULTIPLE VITAMIN (TAB-A-VITE PO)    Take 1 tablet by mouth daily.   OMEGA-3 ACID ETHYL ESTERS (LOVAZA) 1 G CAPSULE    Take 1 g by mouth daily. Fatty Acid one daily   PHENYTOIN (DILANTIN) 100 MG ER CAPSULE    Take 1 capsule by mouth in  the morning and 2 capsules  by mouth at bedtime to prevent seizure.   SERTRALINE (ZOLOFT) 25 MG TABLET    Reported on 07/10/2015   SERTRALINE (ZOLOFT) 50 MG TABLET       WARFARIN (COUMADIN) 3 MG TABLET       WARFARIN (COUMADIN) 6 MG TABLET    Take 6 mg by mouth at bedtime. Reported on 07/10/2015  Modified Medications   No medications on file  Discontinued Medications   No medications on file     Review of Systems  Constitutional: Negative for diaphoresis.  HENT: Positive for hearing loss. Negative for congestion, ear discharge, ear pain, nosebleeds, sore throat and tinnitus.        Bilateral hearing  loss. June 2015 ruptured right Tm with drainage, but little pain.  Eyes: Negative.   Respiratory: Positive for shortness of breath.        History of dyspnea on exertion. Remains on oxygen 24/7 at this time. History of pulmonary embolism and Greenfield filter. Remains on warfarin.  Cardiovascular: Negative.   Gastrointestinal: Negative.   Endocrine: Negative for polydipsia.  Genitourinary: Positive for frequency.       2-3x/night  Musculoskeletal: Positive for back pain and gait problem (unstable using walker).       Osteoarthritis with complaints of pain in multiple areas. Neck and right shoulder pains.  [place] 05/20/15 C/o back pain  Skin:       Senile ecchymoses  Allergic/Immunologic: Negative for environmental allergies.  Neurological: Positive for weakness. Negative for  dizziness, tremors and seizures.       Hx seizures.   Psychiatric/Behavioral: Negative for suicidal ideas.       Chronic anxiety. Memory deficits.    Filed Vitals:   07/10/15 0910  BP: 110/70  Pulse: 75  Temp: 97.3 F (36.3 C)  TempSrc: Oral  Resp: 20  Height: 5' (1.524 m)  Weight: 126 lb (57.153 kg)  SpO2: 94%   Wt Readings from Last 3 Encounters:  07/10/15 126 lb (57.153 kg)  07/03/15 120 lb (54.432 kg)  04/04/15 115 lb 15.4 oz (52.6 kg)    Body mass index is 24.61 kg/(m^2).  Physical Exam  Constitutional: She is oriented to person, place, and time. She appears well-developed and well-nourished. No distress.  HENT:  Bilateral hearing loss. Rupture of the right TM.   Eyes: Conjunctivae and EOM are normal. Pupils are equal, round, and reactive to light.  Lens implants in both eyes  Neck: Normal range of motion. Neck supple. No JVD present. No tracheal deviation present. No thyromegaly present.  Cardiovascular: Normal rate, regular rhythm, normal heart sounds and intact distal pulses.  Exam reveals no gallop and no friction rub.   No murmur heard. Pulmonary/Chest: Effort normal and breath sounds normal. No respiratory distress. She has no wheezes. She exhibits no tenderness.  Abdominal: Soft. Bowel sounds are normal. She exhibits no distension and no mass. There is no tenderness.  Genitourinary: Guaiac negative stool.  From previous exam: Hemorrhoids. Normal sphincter tone. Stools are normal brown color.  Musculoskeletal: Normal range of motion. She exhibits no edema or tenderness.  Tender in the posterior neck. Mild restriction in movements. Hammertoes at the left second and third. Right second toe overlaps the great toe. SP surgical repair of the right 2nd toe.  Shoulders sore. Painful in the right shoulder when she lifts overhead and rotates at the shpoulder.[facility] July 2014. Unstable gait. Using walker with 2 front wheels and rear skids. Weaker grip of the right  hand.. Tender in the left SI joint.  Lymphadenopathy:    She has no cervical adenopathy.  Neurological: She is alert and oriented to person, place, and time. No cranial nerve deficit. Coordination normal.  No loss of grip strength. Loss of memory. 04/04/14 MMSE 25/30. Passed clock drawing. 07/04/14 MMSE 28/30. Failed clock drawing. Bilateral tremor.  Skin: Skin is warm and dry. No rash noted. No erythema. No pallor.  Psychiatric: She has a normal mood and affect. Her behavior is normal. Thought content normal.     Labs reviewed: Lab Summary Latest Ref Rng 04/05/2015 04/04/2015 04/03/2015 04/02/2015 04/01/2015 03/31/2015 03/30/2015  Hemoglobin 12.0 - 15.0 g/dL (None) 11.6(L) (None) 10.8(L) 11.3(L) 11.0(L) 11.0(L)  Hematocrit 36.0 - 46.0 % (  None) 35.9(L) (None) 33.7(L) 35.7(L) 35.1(L) 35.0(L)  White count 4.0 - 10.5 K/uL (None) 8.6 (None) 7.6 8.3 9.0 8.7  Platelet count 150 - 400 K/uL (None) 255 (None) 233 249 260 239  Sodium 137 - 147 mmol/L 142 (None) 141 139 (None) 140 142  Potassium 3.4 - 5.3 mmol/L 4.8 (None) 3.9 4.1 (None) 4.2 3.9  Calcium 8.9 - 10.3 mg/dL (None) (None) 8.9 8.8(L) (None) 8.8(L) 8.8(L)  Phosphorus 2.5 - 4.6 mg/dL (None) (None) (None) (None) (None) (None) (None)  Creatinine 0.5 - 1.1 mg/dL 0.5 (None) 0.58 0.64 (None) 0.55 0.56  AST 15 - 41 U/L (None) (None) (None) (None) (None) (None) (None)  Alk Phos 38 - 126 U/L (None) (None) (None) (None) (None) (None) (None)  Bilirubin 0.3 - 1.2 mg/dL (None) (None) (None) (None) (None) (None) (None)  Glucose - 107 (None) 103(H) 105(H) (None) 90 83  Cholesterol - (None) (None) (None) (None) (None) (None) (None)  HDL cholesterol - (None) (None) (None) (None) (None) (None) (None)  Triglycerides - (None) (None) (None) (None) (None) (None) (None)  LDL Direct - (None) (None) (None) (None) (None) (None) (None)  LDL Calc - (None) (None) (None) (None) (None) (None) (None)  Total protein 6.5 - 8.1 g/dL (None) (None) (None) (None) (None)  (None) (None)  Albumin 3.5 - 5.0 g/dL (None) (None) (None) (None) (None) (None) (None)   Lab Results  Component Value Date   TSH 1.47 03/08/2015   Lab Results  Component Value Date   BUN 18 04/05/2015   BUN 10 04/03/2015   BUN 11 04/02/2015   Lab Results  Component Value Date   CREATININE 0.5 04/05/2015   CREATININE 0.58 04/03/2015   CREATININE 0.64 04/02/2015   Lab Results  Component Value Date   HGBA1C 5.4 03/08/2015   HGBA1C 5.5 11/21/2013   HGBA1C 5.4 08/08/2013       Assessment/Plan  1. Acute deep vein thrombosis (DVT) of femoral vein of left lower extremity (HCC) Greenfield filter is currently without any attached issues. Due to her age and relatively sedentary habits, I would simply leave the Greenfield filter in place.  2. Unstable gait Continue use of walker  3. Other chronic pulmonary embolism without acute cor pulmonale (HCC) Discontinue warfarin - apixaban (ELIQUIS) 5 MG TABS tablet; One twice daily for anticoagulation5  Dispense: 60 tablet  4. Hypoxemic respiratory failure, chronic ([place]) Wean off oxygen if possible. Keep O2 saturations greater than 88%  5. Memory impairment- SNF/ assited living pt Continue donepezil

## 2015-07-11 DIAGNOSIS — H7201 Central perforation of tympanic membrane, right ear: Secondary | ICD-10-CM | POA: Diagnosis not present

## 2015-07-11 DIAGNOSIS — H6123 Impacted cerumen, bilateral: Secondary | ICD-10-CM | POA: Diagnosis not present

## 2015-07-11 DIAGNOSIS — H903 Sensorineural hearing loss, bilateral: Secondary | ICD-10-CM | POA: Diagnosis not present

## 2015-09-04 DIAGNOSIS — N390 Urinary tract infection, site not specified: Secondary | ICD-10-CM | POA: Diagnosis not present

## 2015-09-07 DIAGNOSIS — N390 Urinary tract infection, site not specified: Secondary | ICD-10-CM | POA: Diagnosis not present

## 2015-09-25 ENCOUNTER — Non-Acute Institutional Stay: Payer: Medicare Other | Admitting: Internal Medicine

## 2015-09-25 ENCOUNTER — Encounter: Payer: Self-pay | Admitting: Internal Medicine

## 2015-09-25 VITALS — BP 102/64 | HR 66 | Temp 97.6°F | Ht 60.0 in | Wt 133.0 lb

## 2015-09-25 DIAGNOSIS — I82412 Acute embolism and thrombosis of left femoral vein: Secondary | ICD-10-CM | POA: Diagnosis not present

## 2015-09-25 DIAGNOSIS — R413 Other amnesia: Secondary | ICD-10-CM | POA: Diagnosis not present

## 2015-09-25 DIAGNOSIS — E039 Hypothyroidism, unspecified: Secondary | ICD-10-CM

## 2015-09-25 DIAGNOSIS — F329 Major depressive disorder, single episode, unspecified: Secondary | ICD-10-CM | POA: Diagnosis not present

## 2015-09-25 DIAGNOSIS — G40909 Epilepsy, unspecified, not intractable, without status epilepticus: Secondary | ICD-10-CM

## 2015-09-25 DIAGNOSIS — F32A Depression, unspecified: Secondary | ICD-10-CM

## 2015-09-25 DIAGNOSIS — R2681 Unsteadiness on feet: Secondary | ICD-10-CM

## 2015-09-25 DIAGNOSIS — R531 Weakness: Secondary | ICD-10-CM

## 2015-09-25 MED ORDER — BUPROPION HCL 100 MG PO TABS: ORAL_TABLET | ORAL | Status: DC

## 2015-09-25 NOTE — Progress Notes (Signed)
Patient ID: Rebecca [name], female   DOB: 11-08-1927, 82 y.o.   MRN: [id number]    [place] Room Number: 78  Place of Service: Clinic (12)     Allergies  Allergen Reactions  . Morphine And Related Other (See Comments)    Altered mental state   . Sulfa Antibiotics Other (See Comments)    Reaction: unknown    Chief Complaint  Patient presents with  . Medical Management of Chronic Issues    pain in hands and right knee, fine today. Still upset can't drive, can't cook.     HPI:  Depression - morose. Patient says "nobody loves me, I am no used anybody, I want to begin, my family is all day".  Acute deep vein thrombosis (DVT) of femoral vein of left lower extremity (HCC) - chronic problem currently is on Eliquis.  Memory impairment- SNF/ assited living pt - patient focused on all the things that she cannot do now. Insists that she could be a competent driver. Does not want to be in assisted living.  Seizure disorder ([place]) - no recent seizures. Remains on phenytoin.  Unstable gait - using walker regularly. No falls since last visit.  Weak - generalized frailty. Diminished reflexes.  Hypothyroidism, unspecified hypothyroidism type - remains on levothyroxine.    Medications: Patient's Medications  New Prescriptions   No medications on file  Previous Medications   ACETAMINOPHEN (TYLENOL) 325 MG TABLET    Take 650 mg by mouth every morning.   APIXABAN (ELIQUIS) 5 MG TABS TABLET    One twice daily for anticoagulation5   CALCIUM CARBONATE-VITAMIN D (CALCIUM 600+D) 600-400 MG-UNIT TABLET    Take 1 tablet by mouth daily.   CHOLECALCIFEROL (VITAMIN D-3) 5000 UNITS TABS    Take 5,000 Units by mouth daily.    CLONAZEPAM (KLONOPIN) 0.5 MG TABLET    Take 1 tablet (0.5 mg total) by mouth 2 (two) times daily as needed (anxiety).   DONEPEZIL (ARICEPT) 10 MG TABLET    Take 10 mg by mouth daily. Reported on 07/10/2015   FEEDING SUPPLEMENT (BOOST / RESOURCE  BREEZE) LIQD    Take 1 Container by mouth 2 (two) times daily between meals.   FLUTICASONE FUROATE-VILANTEROL (BREO ELLIPTA) 100-25 MCG/INH AEPB    Inhale 1 puff into the lungs daily.   LEVOTHYROXINE (SYNTHROID, LEVOTHROID) 100 MCG TABLET    Take 1 tablet (100 mcg total) by mouth daily before breakfast. For thyroid   MULTIPLE VITAMIN (TAB-A-VITE PO)    Take 1 tablet by mouth daily.   OMEGA-3 ACID ETHYL ESTERS (LOVAZA) 1 G CAPSULE    Take 1 g by mouth daily. Fatty Acid one daily   PHENYTOIN (DILANTIN) 100 MG ER CAPSULE    Take 1 capsule by mouth in  the morning and 2 capsules  by mouth at bedtime to prevent seizure.   SERTRALINE (ZOLOFT) 25 MG TABLET    Reported on 07/10/2015   SERTRALINE (ZOLOFT) 50 MG TABLET      Modified Medications   No medications on file  Discontinued Medications   No medications on file     Review of Systems  Constitutional: Negative for diaphoresis.  HENT: Positive for hearing loss. Negative for congestion, ear discharge, ear pain, nosebleeds, sore throat and tinnitus.        Bilateral hearing loss. June 2015 ruptured right Tm with drainage, but little pain.  Eyes: Negative.   Respiratory: Positive for shortness of breath.  History of dyspnea on exertion. Remains on oxygen 24/7 at this time. History of pulmonary embolism and Greenfield filter. Remains on warfarin.  Cardiovascular: Negative.   Gastrointestinal: Negative.   Endocrine: Negative for polydipsia.  Genitourinary: Positive for frequency.       2-3x/night  Musculoskeletal: Positive for back pain and gait problem (unstable using walker).       Osteoarthritis with complaints of pain in multiple areas. Neck and right shoulder pains.  [place] 05/20/15 C/o back pain  Skin:       Senile ecchymoses  Allergic/Immunologic: Negative for environmental allergies.  Neurological: Positive for weakness. Negative for dizziness, tremors and seizures.       Hx seizures.   Psychiatric/Behavioral: Positive for  confusion, dysphoric mood and decreased concentration. Negative for suicidal ideas. The patient is nervous/anxious.        Chronic anxiety. Memory deficits.    Filed Vitals:   09/25/15 1123  BP: 102/64  Pulse: 66  Temp: 97.6 F (36.4 C)  TempSrc: Oral  Height: 5' (1.524 m)  Weight: 133 lb (60.328 kg)  SpO2: 94%   Wt Readings from Last 3 Encounters:  09/25/15 133 lb (60.328 kg)  07/10/15 126 lb (57.153 kg)  07/03/15 120 lb (54.432 kg)    Body mass index is 25.97 kg/(m^2).  Physical Exam  Constitutional: She is oriented to person, place, and time. She appears well-developed and well-nourished. No distress.  HENT:  Bilateral hearing loss. Rupture of the right TM.   Eyes: Conjunctivae and EOM are normal. Pupils are equal, round, and reactive to light.  Lens implants in both eyes  Neck: Normal range of motion. Neck supple. No JVD present. No tracheal deviation present. No thyromegaly present.  Cardiovascular: Normal rate, regular rhythm, normal heart sounds and intact distal pulses.  Exam reveals no gallop and no friction rub.   No murmur heard. Pulmonary/Chest: Effort normal and breath sounds normal. No respiratory distress. She has no wheezes. She exhibits no tenderness.  Abdominal: Soft. Bowel sounds are normal. She exhibits no distension and no mass. There is no tenderness.  Genitourinary: Guaiac negative stool.  From previous exam: Hemorrhoids. Normal sphincter tone. Stools are normal brown color.  Musculoskeletal: Normal range of motion. She exhibits no edema or tenderness.  Tender in the posterior neck. Mild restriction in movements. Hammertoes at the left second and third. Right second toe overlaps the great toe. SP surgical repair of the right 2nd toe.  Shoulders sore. Painful in the right shoulder when she lifts overhead and rotates at the shpoulder.[facility] July 2014. Unstable gait. Using walker with 2 front wheels and rear skids. Weaker grip of the right hand.. Tender in  the left SI joint.  Lymphadenopathy:    She has no cervical adenopathy.  Neurological: She is alert and oriented to person, place, and time. No cranial nerve deficit. Coordination normal.  No loss of grip strength. Loss of memory. 04/04/14 MMSE 25/30. Passed clock drawing. 07/04/14 MMSE 28/30. Failed clock drawing. Bilateral tremor.  Skin: Skin is warm and dry. No rash noted. No erythema. No pallor.  Psychiatric: Thought content normal.  depressed     Labs reviewed: Lab Summary Latest Ref Rng 04/05/2015 04/04/2015 04/03/2015 04/02/2015 04/01/2015 03/31/2015 03/30/2015  Hemoglobin 12.0 - 15.0 g/dL (None) 11.6(L) (None) 10.8(L) 11.3(L) 11.0(L) 11.0(L)  Hematocrit 36.0 - 46.0 % (None) 35.9(L) (None) 33.7(L) 35.7(L) 35.1(L) 35.0(L)  White count 4.0 - 10.5 K/uL (None) 8.6 (None) 7.6 8.3 9.0 8.7  Platelet count 150 - 400 K/uL (None)  255 (None) 233 249 260 239  Sodium 137 - 147 mmol/L 142 (None) 141 139 (None) 140 142  Potassium 3.4 - 5.3 mmol/L 4.8 (None) 3.9 4.1 (None) 4.2 3.9  Calcium 8.9 - 10.3 mg/dL (None) (None) 8.9 8.8(L) (None) 8.8(L) 8.8(L)  Phosphorus 2.5 - 4.6 mg/dL (None) (None) (None) (None) (None) (None) (None)  Creatinine 0.5 - 1.1 mg/dL 0.5 (None) 0.58 0.64 (None) 0.55 0.56  AST 15 - 41 U/L (None) (None) (None) (None) (None) (None) (None)  Alk Phos 38 - 126 U/L (None) (None) (None) (None) (None) (None) (None)  Bilirubin 0.3 - 1.2 mg/dL (None) (None) (None) (None) (None) (None) (None)  Glucose - 107 (None) 103(H) 105(H) (None) 90 83  Cholesterol - (None) (None) (None) (None) (None) (None) (None)  HDL cholesterol - (None) (None) (None) (None) (None) (None) (None)  Triglycerides - (None) (None) (None) (None) (None) (None) (None)  LDL Direct - (None) (None) (None) (None) (None) (None) (None)  LDL Calc - (None) (None) (None) (None) (None) (None) (None)  Total protein 6.5 - 8.1 g/dL (None) (None) (None) (None) (None) (None) (None)  Albumin 3.5 - 5.0 g/dL (None) (None) (None)  (None) (None) (None) (None)   Lab Results  Component Value Date   TSH 1.47 03/08/2015   Lab Results  Component Value Date   BUN 18 04/05/2015   BUN 10 04/03/2015   BUN 11 04/02/2015   Lab Results  Component Value Date   CREATININE 0.5 04/05/2015   CREATININE 0.58 04/03/2015   CREATININE 0.64 04/02/2015   Lab Results  Component Value Date   HGBA1C 5.4 03/08/2015   HGBA1C 5.5 11/21/2013   HGBA1C 5.4 08/08/2013       Assessment/Plan  1. Depression - Discontinue sertraline - buPROPion (WELLBUTRIN) 100 MG tablet; One twice daily to help nerves and depression  Dispense: 60 tablet; Refill: 5  2. Acute deep vein thrombosis (DVT) of femoral vein of left lower extremity (HCC) -Continue Eliquis  3. Memory impairment- SNF/ assited living pt Continue donepezil. Consider Namzeric future visit.  4. Seizure disorder (HCC) Continue phenytoin -Phenytoin level   5. Unstable gait continue use a walker  6. Weak --CMP  7. Hypothyroidism, unspecified hypothyroidism type -TSH

## 2015-09-26 NOTE — Progress Notes (Signed)
This encounter was created in error - please disregard.

## 2015-09-27 DIAGNOSIS — R5382 Chronic fatigue, unspecified: Secondary | ICD-10-CM | POA: Diagnosis not present

## 2015-09-27 DIAGNOSIS — Z79899 Other long term (current) drug therapy: Secondary | ICD-10-CM | POA: Diagnosis not present

## 2015-09-27 DIAGNOSIS — E039 Hypothyroidism, unspecified: Secondary | ICD-10-CM | POA: Diagnosis not present

## 2015-09-27 LAB — BASIC METABOLIC PANEL
BUN: 29 mg/dL — AB (ref 4–21)
Creatinine: 0.6 mg/dL (ref 0.5–1.1)
Glucose: 88 mg/dL
Potassium: 4.2 mmol/L (ref 3.4–5.3)
SODIUM: 141 mmol/L (ref 137–147)

## 2015-09-27 LAB — HEPATIC FUNCTION PANEL
ALK PHOS: 84 U/L (ref 25–125)
ALT: 13 U/L (ref 7–35)
AST: 18 U/L (ref 13–35)
BILIRUBIN, TOTAL: 0.4 mg/dL

## 2015-09-27 LAB — TSH: TSH: 1.96 u[IU]/mL (ref 0.41–5.90)

## 2015-10-09 ENCOUNTER — Non-Acute Institutional Stay: Payer: Medicare Other | Admitting: Internal Medicine

## 2015-10-09 ENCOUNTER — Encounter: Payer: Self-pay | Admitting: Internal Medicine

## 2015-10-09 VITALS — BP 114/64 | HR 60 | Temp 97.9°F | Ht 60.0 in | Wt 136.0 lb

## 2015-10-09 DIAGNOSIS — R2681 Unsteadiness on feet: Secondary | ICD-10-CM

## 2015-10-09 DIAGNOSIS — F329 Major depressive disorder, single episode, unspecified: Secondary | ICD-10-CM

## 2015-10-09 DIAGNOSIS — M19041 Primary osteoarthritis, right hand: Secondary | ICD-10-CM | POA: Diagnosis not present

## 2015-10-09 DIAGNOSIS — M19042 Primary osteoarthritis, left hand: Secondary | ICD-10-CM

## 2015-10-09 DIAGNOSIS — J9611 Chronic respiratory failure with hypoxia: Secondary | ICD-10-CM | POA: Diagnosis not present

## 2015-10-09 DIAGNOSIS — F32A Depression, unspecified: Secondary | ICD-10-CM

## 2015-10-09 DIAGNOSIS — I2782 Chronic pulmonary embolism: Secondary | ICD-10-CM | POA: Diagnosis not present

## 2015-10-09 DIAGNOSIS — G40909 Epilepsy, unspecified, not intractable, without status epilepticus: Secondary | ICD-10-CM

## 2015-10-09 DIAGNOSIS — M159 Polyosteoarthritis, unspecified: Secondary | ICD-10-CM | POA: Insufficient documentation

## 2015-10-09 HISTORY — DX: Primary osteoarthritis, right hand: M19.041

## 2015-10-09 MED ORDER — CITALOPRAM HYDROBROMIDE 10 MG PO TABS: ORAL_TABLET | ORAL | Status: DC

## 2015-10-09 NOTE — Progress Notes (Signed)
Patient ID: Rebecca [name], female   DOB: [birth year], 82 y.o.   MRN: [id number]    [place] Room Number: AL 36  Place of Service: Clinic (12)     Allergies  Allergen Reactions  . Morphine And Related Other (See Comments)    Altered mental state   . Sulfa Antibiotics Other (See Comments)    Reaction: unknown    Chief Complaint  Patient presents with  . Medical Management of Chronic Issues    3 month medicaation management depression, memory, thyroid. With son [name]    HPI:   Depression- switched sertraline to bupropion 2 weeks ago.Switch to Celexa 10 mg of performances that there might be a seizure potential induced by the bupropion. Patient tolerated Celexa well. No side effects noted.   Unstable gait - Continues to use a four-wheel walker although she is resentful of having used  Hypoxemic respiratory failure, chronic (HCC) - O2 sat ranging 90% plus. Oxygen was discontinued.  Seizure disorder (HCC) - Last phenytoin level was 11  Other chronic pulmonary embolism without acute cor pulmonale (HCC) - remains on apixaban  Osteoarthritis of hands - right hand is uncomfortable. She is losing joint flexibility. There are significant deformities bilaterally secondary to osteoarthritis.  Medications: Patient's Medications  New Prescriptions   No medications on file  Previous Medications   ACETAMINOPHEN (TYLENOL) 325 MG TABLET    Take 650 mg by mouth every morning.   APIXABAN (ELIQUIS) 5 MG TABS TABLET    One twice daily for anticoagulation5   BUPROPION (WELLBUTRIN) 100 MG TABLET    One twice daily to help nerves and depression   CALCIUM CARBONATE-VITAMIN D (CALCIUM 600+D) 600-400 MG-UNIT TABLET    Take 1 tablet by mouth daily.   CHOLECALCIFEROL (VITAMIN D-3) 5000 UNITS TABS    Take 5,000 Units by mouth daily.    CLONAZEPAM (KLONOPIN) 0.5 MG TABLET    Take 1 tablet (0.5 mg total) by mouth 2 (two) times daily as needed (anxiety).   DONEPEZIL (ARICEPT)  10 MG TABLET    Take 10 mg by mouth daily. Reported on 07/10/2015   FEEDING SUPPLEMENT (BOOST / RESOURCE BREEZE) LIQD    Take 1 Container by mouth 2 (two) times daily between meals.   FLUTICASONE FUROATE-VILANTEROL (BREO ELLIPTA) 100-25 MCG/INH AEPB    Inhale 1 puff into the lungs daily.   LEVOTHYROXINE (SYNTHROID, LEVOTHROID) 100 MCG TABLET    Take 1 tablet (100 mcg total) by mouth daily before breakfast. For thyroid   MULTIPLE VITAMIN (TAB-A-VITE PO)    Take 1 tablet by mouth daily.   OMEGA-3 ACID ETHYL ESTERS (LOVAZA) 1 G CAPSULE    Take 1 g by mouth daily. Fatty Acid one daily   PHENYTOIN (DILANTIN) 100 MG ER CAPSULE    Take 1 capsule by mouth in  the morning and 2 capsules  by mouth at bedtime to prevent seizure.  Modified Medications   No medications on file  Discontinued Medications   No medications on file     Review of Systems  Constitutional: Negative for diaphoresis.  HENT: Positive for hearing loss. Negative for congestion, ear discharge, ear pain, nosebleeds, sore throat and tinnitus.        Bilateral hearing loss. June 2015 ruptured right Tm with drainage, but little pain.  Eyes: Negative.   Respiratory: Positive for shortness of breath.        History of dyspnea on exertion. Remains on oxygen 24/7 at this time. History  of pulmonary embolism and [place] filter. Remains on warfarin.  Cardiovascular: Negative.   Gastrointestinal: Negative.   Endocrine: Negative for polydipsia.  Genitourinary: Positive for frequency.       2-3x/night  Musculoskeletal: Positive for back pain, arthralgias (both hands) and gait problem (unstable using walker).       Osteoarthritis with complaints of pain in multiple areas. Neck and right shoulder pains.  [place] 05/20/15 C/o back pain  Skin:       Senile ecchymoses  Allergic/Immunologic: Negative for environmental allergies.  Neurological: Positive for weakness. Negative for dizziness, tremors and seizures.       Hx seizures.     Psychiatric/Behavioral: Positive for confusion, dysphoric mood and decreased concentration. Negative for suicidal ideas. The patient is nervous/anxious.        Chronic anxiety. Memory deficits.    Filed Vitals:   10/09/15 0938  BP: 114/64  Pulse: 60  Temp: 97.9 F (36.6 C)  TempSrc: Oral  Height: 5' (1.524 m)  Weight: 136 lb (61.689 kg)  SpO2: 92%   Wt Readings from Last 3 Encounters:  10/09/15 136 lb (61.689 kg)  09/25/15 133 lb (60.328 kg)  07/10/15 126 lb (57.153 kg)    Body mass index is 26.56 kg/(m^2).  Physical Exam  Constitutional: She is oriented to person, place, and time. She appears well-developed and well-nourished. No distress.  HENT:  Bilateral hearing loss. Rupture of the right TM.   Eyes: Conjunctivae and EOM are normal. Pupils are equal, round, and reactive to light.  Lens implants in both eyes  Neck: Normal range of motion. Neck supple. No JVD present. No tracheal deviation present. No thyromegaly present.  Cardiovascular: Normal rate, regular rhythm, normal heart sounds and intact distal pulses.  Exam reveals no gallop and no friction rub.   No murmur heard. Pulmonary/Chest: Effort normal and breath sounds normal. No respiratory distress. She has no wheezes. She exhibits no tenderness.  Abdominal: Soft. Bowel sounds are normal. She exhibits no distension and no mass. There is no tenderness.  Genitourinary: Guaiac negative stool.  From previous exam: Hemorrhoids. Normal sphincter tone. Stools are normal brown color.  Musculoskeletal: Normal range of motion. She exhibits no edema or tenderness.  Tender in the posterior neck. Mild restriction in movements. Hammertoes at the left second and third. Right second toe overlaps the great toe. SP surgical repair of the right 2nd toe.  Shoulders sore. Painful in the right shoulder when she lifts overhead and rotates at the [place].[facility] July 2014. Pain in both hands and significant deformities related to OA.   Unstable gait. Using walker with 2 front wheels and rear skids. Weaker grip of the right hand.. Tender in the left SI joint.  Lymphadenopathy:    She has no cervical adenopathy.  Neurological: She is alert and oriented to person, place, and time. No cranial nerve deficit. Coordination normal.  No loss of grip strength. Loss of memory. 04/04/14 MMSE 25/30. Passed clock drawing. 07/04/14 MMSE 28/30. Failed clock drawing. Bilateral tremor.  Skin: Skin is warm and dry. No rash noted. No erythema. No pallor.  Psychiatric: Thought content normal.  depressed     Labs reviewed: Lab Summary Latest Ref Rng 09/27/2015 04/05/2015 04/04/2015 04/03/2015 04/02/2015 04/01/2015 03/31/2015  Hemoglobin 12.0 - 15.0 g/dL (None) (None) 11.6(L) (None) 10.8(L) 11.3(L) 11.0(L)  Hematocrit 36.0 - 46.0 % (None) (None) 35.9(L) (None) 33.7(L) 35.7(L) 35.1(L)  White count 4.0 - 10.5 K/uL (None) (None) 8.6 (None) 7.6 8.3 9.0  Platelet count 150 - 400  K/uL (None) (None) 255 (None) 233 249 260  Sodium 137 - 147 mmol/L 141 142 (None) 141 139 (None) 140  Potassium 3.4 - 5.3 mmol/L 4.2 4.8 (None) 3.9 4.1 (None) 4.2  Calcium 8.9 - 10.3 mg/dL (None) (None) (None) 8.9 8.8(L) (None) 8.8(L)  Phosphorus 2.5 - 4.6 mg/dL (None) (None) (None) (None) (None) (None) (None)  Creatinine 0.5 - 1.1 mg/dL 0.6 0.5 (None) 0.58 0.64 (None) 0.55  AST 13 - 35 U/L 18 (None) (None) (None) (None) (None) (None)  Alk Phos 25 - 125 U/L 84 (None) (None) (None) (None) (None) (None)  Bilirubin 0.3 - 1.2 mg/dL (None) (None) (None) (None) (None) (None) (None)  Glucose - 88 107 (None) 103(H) 105(H) (None) 90  Cholesterol - (None) (None) (None) (None) (None) (None) (None)  HDL cholesterol - (None) (None) (None) (None) (None) (None) (None)  Triglycerides - (None) (None) (None) (None) (None) (None) (None)  LDL Direct - (None) (None) (None) (None) (None) (None) (None)  LDL Calc - (None) (None) (None) (None) (None) (None) (None)  Total protein 6.5 - 8.1  g/dL (None) (None) (None) (None) (None) (None) (None)  Albumin 3.5 - 5.0 g/dL (None) (None) (None) (None) (None) (None) (None)   Lab Results  Component Value Date   TSH 1.96 09/27/2015   Lab Results  Component Value Date   BUN 29* 09/27/2015   BUN 18 04/05/2015   BUN 10 04/03/2015   Lab Results  Component Value Date   CREATININE 0.6 09/27/2015   CREATININE 0.5 04/05/2015   CREATININE 0.58 04/03/2015   Lab Results  Component Value Date   HGBA1C 5.4 03/08/2015   HGBA1C 5.5 11/21/2013   HGBA1C 5.4 08/08/2013       Assessment/Plan  1. Depression - citalopram (CELEXA) 10 MG tablet; One daily to treat depression and anxiety  Dispense: 30 tablet; Refill: 5  2. Unstable gait Continue walker  3. Hypoxemic respiratory failure, chronic (HCC) Discontinue oxygen  4. Seizure disorder ([place]) Continue current dose phenytoin  5. Other chronic pulmonary embolism without acute cor pulmonale (HCC) Continue apixaban  6. Primary osteoarthritis of both hands Try Aspercream 4 times daily

## 2015-10-10 DIAGNOSIS — H6123 Impacted cerumen, bilateral: Secondary | ICD-10-CM | POA: Diagnosis not present

## 2015-10-10 DIAGNOSIS — H7201 Central perforation of tympanic membrane, right ear: Secondary | ICD-10-CM | POA: Diagnosis not present

## 2015-10-10 DIAGNOSIS — H903 Sensorineural hearing loss, bilateral: Secondary | ICD-10-CM | POA: Diagnosis not present

## 2015-12-11 ENCOUNTER — Non-Acute Institutional Stay: Payer: Medicare Other | Admitting: Nurse Practitioner

## 2015-12-11 ENCOUNTER — Encounter: Payer: Self-pay | Admitting: Nurse Practitioner

## 2015-12-11 DIAGNOSIS — M19042 Primary osteoarthritis, left hand: Secondary | ICD-10-CM

## 2015-12-11 DIAGNOSIS — E039 Hypothyroidism, unspecified: Secondary | ICD-10-CM

## 2015-12-11 DIAGNOSIS — R413 Other amnesia: Secondary | ICD-10-CM | POA: Diagnosis not present

## 2015-12-11 DIAGNOSIS — G40909 Epilepsy, unspecified, not intractable, without status epilepticus: Secondary | ICD-10-CM | POA: Diagnosis not present

## 2015-12-11 DIAGNOSIS — F329 Major depressive disorder, single episode, unspecified: Secondary | ICD-10-CM | POA: Diagnosis not present

## 2015-12-11 DIAGNOSIS — M19041 Primary osteoarthritis, right hand: Secondary | ICD-10-CM | POA: Diagnosis not present

## 2015-12-11 DIAGNOSIS — I2782 Chronic pulmonary embolism: Secondary | ICD-10-CM | POA: Diagnosis not present

## 2015-12-11 DIAGNOSIS — F32A Depression, unspecified: Secondary | ICD-10-CM

## 2015-12-11 NOTE — Assessment & Plan Note (Signed)
Last seizure 2010 02/01/15 Dilantin lever 12.4(10-20) Continue Dilantin '100mg'$  am, '200mg'$  hs

## 2015-12-11 NOTE — Assessment & Plan Note (Signed)
Sleeps well, mood is stable, continue Celexa '10mg'$  daily, Clonazepam 0.'5mg'$  bid, observe.

## 2015-12-11 NOTE — Assessment & Plan Note (Signed)
Obtain CBC, CMP, TSH, ESR, RAF. Mobic 7.33m daily.

## 2015-12-11 NOTE — Assessment & Plan Note (Signed)
Continue anticoagulation therapy.

## 2015-12-11 NOTE — Assessment & Plan Note (Signed)
Continue Levothyroxine 143mg, 09/27/15 TSH 1.96

## 2015-12-11 NOTE — Assessment & Plan Note (Signed)
03/20/15 MMSE 27/30, continue Aricept '10mg'$  daily.

## 2015-12-11 NOTE — Progress Notes (Signed)
Location:   [place] Room Number: AL-36 Place of Service:   SNF Provider:  Keondria Siever  NP  Jeanmarie Hubert, MD  Patient Care Team: Estill Dooms, MD as PCP - General (Internal Medicine) [facility] [name], MD as Consulting Physician (Orthopedic Surgery) [name], MD as Consulting Physician (Physical Medicine and Rehabilitation) Melina Schools, MD as Consulting Physician (Orthopedic Surgery) [name], MD as Consulting Physician (Otolaryngology) Jaqualin Serpa Otho Darner, NP as Nurse Practitioner (Internal Medicine)  Extended Emergency Contact Information Primary Emergency Contact: Hebdon,[name] Address: [address], [place] Phone: [phone] Mobile Phone: [phone] Relation: Son Secondary Emergency Contact: [name] [place] Phone: [phone] Mobile Phone: [phone] Relation: Daughter  Code Status:  DNR Goals of care: Advanced Directive information Advanced Directives 12/11/2015  Does patient have an advance directive? Yes  Type of Paramedic of [place];Out of facility DNR (pink MOST or yellow form);Living will  Does patient want to make changes to advanced directive? No - Patient declined  Copy of advanced directive(s) in chart? Yes  Pre-existing out of facility DNR order (yellow form or pink MOST form) -     Chief Complaint  Patient presents with  . Acute Visit    both hands swollen and warm to the touch    HPI:  Pt is a 82 y.o. female seen today for an acute visit for c/o aches pains and weak grip strength in all enlarged finger joints, no redness or swelling, hx of the pain and deformity.    Past Medical History:  Diagnosis Date  . Anemia, unspecified 03/18/2011  . Anxiety state, unspecified 01/2011  . Blood in stool 06/15/2012  . Corns and callosities 07/01/2011  . Diverticulosis 02/2011  . Dizziness   . DJD (degenerative joint  disease)   . Dysuria 06/17/2011  . Hemorrhoids 02/2011  . Low back pain   . Mitral valve problem thickened   thickened  . Osteoarthritis of both hands 10/09/2015  . Osteoporosis 02/2011  . Other abnormal blood chemistry 0/30/2012  . Other acquired deformity of toe 12/16/2011  . Pain in joint, site unspecified 01/2012  . 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 Sacroiliitis, not elsewhere classified ([place]) 05/2011  . Seizures ([place])    Remotely  . Unspecified hypothyroidism 01/2011  . 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 Vitamin D deficiency 01/2011   Past Surgical History:  Procedure Laterality Date  . bletheroplasty    . CATARACT EXTRACTION W/ INTRAOCULAR LENS  IMPLANT, BILATERAL  2010  . CERVICAL LAMINECTOMY  2005  . HAMMER TOE SURGERY  2013   right 2nd toe Dr. Mallie Mussel  . JOINT REPLACEMENT Bilateral 2002 Right, 1992 Left   knees    Allergies  Allergen Reactions  . Morphine And Related Other (See Comments)    Altered mental state   . Sulfa Antibiotics Other (See Comments)    Reaction: unknown      Medication List       Accurate as of 12/11/15  6:03 PM. Always use your most recent med list.          acetaminophen 325 MG tablet Commonly known as:  TYLENOL Take 650 mg by mouth every morning.   amoxicillin 500 MG capsule Commonly known as:  AMOXIL 500 mg. Take 4 capsule by mouth 1 hour before procedure as  needed.   apixaban 5 MG Tabs tablet Commonly known as:  ELIQUIS One twice daily for anticoagulation5   BREO ELLIPTA 100-25 MCG/INH Aepb Generic drug:  fluticasone furoate-vilanterol Inhale 1 puff into the lungs daily.   CALCIUM 600+D 600-400 MG-UNIT tablet Generic drug:  Calcium Carbonate-Vitamin D Take 1 tablet by mouth daily.   citalopram 10 MG tablet Commonly known as:  CELEXA One daily to treat depression and anxiety   clonazePAM 0.5 MG tablet Commonly known as:  KLONOPIN Take 1 tablet (0.5 mg  total) by mouth 2 (two) times daily as needed (anxiety).   donepezil 10 MG tablet Commonly known as:  ARICEPT Take 10 mg by mouth daily. Reported on 07/10/2015   levothyroxine 100 MCG tablet Commonly known as:  SYNTHROID, LEVOTHROID Take 1 tablet (100 mcg total) by mouth daily before breakfast. For thyroid   omega-3 acid ethyl esters 1 g capsule Commonly known as:  LOVAZA Take 1 g by mouth daily. Fatty Acid one daily   phenytoin 100 MG ER capsule Commonly known as:  DILANTIN Take 1 capsule by mouth in  the morning and 2 capsules  by mouth at bedtime to prevent seizure.   TAB-A-VITE PO Take 1 tablet by mouth daily.   Vitamin D-3 5000 UNITS Tabs Take 5,000 Units by mouth daily.       Review of Systems  Constitutional: Negative for diaphoresis.  HENT: Positive for hearing loss. Negative for congestion, ear discharge, ear pain, nosebleeds, sore throat and tinnitus.        Bilateral hearing loss. June 2015 ruptured right Tm with drainage, but little pain.  Eyes: Negative.   Respiratory: Positive for shortness of breath.        History of dyspnea on exertion. Remains on oxygen 24/7 at this time. History of pulmonary embolism and Greenfield filter. Remains on warfarin.  Cardiovascular: Negative.   Gastrointestinal: Negative.   Endocrine: Negative for polydipsia.  Genitourinary: Positive for frequency.       2-3x/night  Musculoskeletal: Positive for arthralgias (both hands), back pain and gait problem (unstable using walker).       Osteoarthritis with complaints of pain in multiple areas. Neck and right shoulder pains.  [place] 05/20/15 C/o back pain C/o pain in fingers  Skin:       Senile ecchymoses  Allergic/Immunologic: Negative for environmental allergies.  Neurological: Positive for weakness. Negative for dizziness, tremors and seizures.       Hx seizures.   Psychiatric/Behavioral: Positive for confusion, decreased concentration and dysphoric mood. Negative for suicidal  ideas. The patient is nervous/anxious.        Chronic anxiety. Memory deficits.    Immunization History  Administered Date(s) Administered  . Influenza Whole 03/17/2007, 02/16/2009, 01/30/2010, 02/16/2013  . Influenza-Unspecified 03/02/2014, 02/22/2015  . PPD Test 07/09/2010  . Pneumococcal Polysaccharide-23 05/19/2005, 05/23/2013  . Td 05/19/2005  . Tetanus 10/06/2012   Pertinent  Health Maintenance Due  Topic Date Due  . DEXA SCAN  11/19/1994  . PNA vac Low Risk Adult (2 of 2 - PCV13) 05/23/2014  . INFLUENZA VACCINE  12/18/2015   Fall Risk  10/09/2015 07/10/2015 03/26/2015 12/19/2014 03/22/2013  Falls in the past year? No Yes Yes Yes Yes  Number falls in past yr: - _0 or more  Injury with Fall? - Yes No Yes -   Functional Status Survey:    Vitals:   12/11/15 1421  BP: 123/69  Pulse: (!) 58  Resp: 20  Temp: 98.4 F (36.9  C)  SpO2: 94%  Weight: 135 lb (61.2 kg)  Height: 5' (1.524 m)   Body mass index is 26.37 kg/m. Physical Exam  Constitutional: She is oriented to person, place, and time. She appears well-developed and well-nourished. No distress.  HENT:  Bilateral hearing loss. Rupture of the right TM.   Eyes: Conjunctivae and EOM are normal. Pupils are equal, round, and reactive to light.  Lens implants in both eyes  Neck: Normal range of motion. Neck supple. No JVD present. No tracheal deviation present. No thyromegaly present.  Cardiovascular: Normal rate, regular rhythm, normal heart sounds and intact distal pulses.  Exam reveals no gallop and no friction rub.   No murmur heard. Pulmonary/Chest: Effort normal and breath sounds normal. No respiratory distress. She has no wheezes. She exhibits no tenderness.  Abdominal: Soft. Bowel sounds are normal. She exhibits no distension and no mass. There is no tenderness.  Genitourinary: Rectal exam shows guaiac negative stool.  Genitourinary Comments: From previous exam: Hemorrhoids. Normal sphincter tone. Stools are  normal brown color.  Musculoskeletal: Normal range of motion. She exhibits tenderness and deformity. She exhibits no edema.  Tender in the posterior neck. Mild restriction in movements. Hammertoes at the left second and third. Right second toe overlaps the great toe. SP surgical repair of the right 2nd toe.  Shoulders sore. Painful in the right shoulder when she lifts overhead and rotates at the shpoulder.[facility] July 2014. Pain in both hands and significant deformities related to OA.  Unstable gait. Using walker with 2 front wheels and rear skids. Weaker grip of the right hand.. Tender in the left SI joint. Tender in all enlarged finger joints.   Lymphadenopathy:    She has no cervical adenopathy.  Neurological: She is alert and oriented to person, place, and time. No cranial nerve deficit. Coordination normal.  No loss of grip strength. Loss of memory. 04/04/14 MMSE 25/30. Passed clock drawing. 07/04/14 MMSE 28/30. Failed clock drawing. Bilateral tremor.  Skin: Skin is warm and dry. No rash noted. No erythema. No pallor.  Psychiatric: Thought content normal.  depressed    Labs reviewed:  Recent Labs  03/29/15 0232  03/31/15 0245 04/02/15 0553 04/03/15 0655 04/05/15 09/27/15  NA 137  < > 140 139 141 142 141  K 4.3  < > 4.2 4.1 3.9 4.8 4.2  CL 104  < > 103 103 103  --   --   CO2 24  < > _0 --   --   GLUCOSE 159*  < > 90 105* 103*  --   --   BUN 15  < > _1 29*  CREATININE 0.64  < > 0.55 0.64 0.58 0.5 0.6  CALCIUM 8.3*  < > 8.8* 8.8* 8.9  --   --   MG 2.0  --   --  1.9  --   --   --   PHOS 4.2  --   --   --   --   --   --   < > = values in this interval not displayed.  Recent Labs  03/11/15 1838 03/23/15 03/29/15 1045 09/27/15  AST 22 26 33 18  ALT _2 ALKPHOS 62 83 75 84  BILITOT 0.6  --  0.1*  --   PROT 6.4*  --  6.2*  --   ALBUMIN 3.3*  --  2.3*  --     Recent Labs  01/11/15 1634  03/11/15 1838  03/28/15 1138  04/01/15 0536  04/02/15 0553 04/04/15 0519  WBC 5.5  < > 13.0*  < > 12.3*  < > 8.3 7.6 8.6  NEUTROABS 3.6  --  11.8*  --  8.3*  --   --   --   --   HGB 12.8  < > 12.5  < > 12.7  < > 11.3* 10.8* 11.6*  HCT 38.7  < > 38.3  < > 38.8  < > 35.7* 33.7* 35.9*  MCV 99.5  < > 98.2  < > 98.7  < > 98.3 99.7 99.4  PLT 195  < > 157  < > 255  < > 249 233 255  < > = values in this interval not displayed. Lab Results  Component Value Date   TSH 1.96 09/27/2015   Lab Results  Component Value Date   HGBA1C 5.4 03/08/2015   Lab Results  Component Value Date   CHOL 235 (H) 03/06/2009   HDL 108.40 03/06/2009   LDLDIRECT 108.2 03/06/2009    Significant Diagnostic Results in last 30 days:  No results found.  Assessment/Plan There are no diagnoses linked to this encounter. Osteoarthritis of both hands Obtain CBC, CMP, TSH, ESR, RAF. Mobic 7.80m daily.   Seizure disorder ([facility] Last seizure 2010 02/01/15 Dilantin lever 12.4(10-20) Continue Dilantin 1078mam, 20032ms   Hypothyroidism Continue Levothyroxine 100m82m5/11/17 TSH 1.96  Chronic pulmonary embolism (HCC) Continue anticoagulation therapy.   Memory impairment- SNF/ assited living pt 03/20/15 MMSE 27/30, continue Aricept 10mg84mly.   Depression Sleeps well, mood is stable, continue Celexa 10mg 76my, Clonazepam 0.5mg bi56mobserve.     Family/ staff Communication: continue AL for care needs  Labs/tests ordered:  CBC, ESR, RAF, CMP Patient ID: Rebecca [name]   DOB: [id number] MRN: [id number]   Nursing Home Room Number: AL-36  Place of Service:       Allergies  Allergen Reactions  . Morphine And Related Other (See Comments)    Altered mental state   . Sulfa Antibiotics Other (See Comments)    Reaction: unknown    Chief Complaint  Patient presents with  . Acute Visit    both hands swollen and warm to the touch    HPI:   Depression- switched sertraline to bupropion 2 weeks ago.Switch to Celexa 10  mg of performances that there might be a seizure potential induced by the bupropion. Patient tolerated Celexa well. No side effects noted.   Unstable gait - Continues to use a four-wheel walker although she is resentful of having used  Hypoxemic respiratory failure, chronic (HCC) - O2 sat ranging 90% plus. Oxygen was discontinued.  Seizure disorder (HCC) - Last phenytoin level was 11  Other chronic pulmonary embolism without acute cor pulmonale (HCC) - remains on apixaban  Osteoarthritis of hands - right hand is uncomfortable. She is losing joint flexibility. There are significant deformities bilaterally secondary to osteoarthritis.  Medications: Patient's Medications  New Prescriptions   No medications on file  Previous Medications   ACETAMINOPHEN (TYLENOL) 325 MG TABLET    Take 650 mg by mouth every morning.   AMOXICILLIN (AMOXIL) 500 MG CAPSULE    500 mg. Take 4 capsule by mouth 1 hour before procedure as needed.   APIXABAN (ELIQUIS) 5 MG TABS TABLET    One twice daily for anticoagulation5   CALCIUM CARBONATE-VITAMIN D (CALCIUM 600+D) 600-400 MG-UNIT TABLET    Take 1  tablet by mouth daily.   CHOLECALCIFEROL (VITAMIN D-3) 5000 UNITS TABS    Take 5,000 Units by mouth daily.    CITALOPRAM (CELEXA) 10 MG TABLET    One daily to treat depression and anxiety   CLONAZEPAM (KLONOPIN) 0.5 MG TABLET    Take 1 tablet (0.5 mg total) by mouth 2 (two) times daily as needed (anxiety).   DONEPEZIL (ARICEPT) 10 MG TABLET    Take 10 mg by mouth daily. Reported on 07/10/2015   FLUTICASONE FUROATE-VILANTEROL (BREO ELLIPTA) 100-25 MCG/INH AEPB    Inhale 1 puff into the lungs daily.   LEVOTHYROXINE (SYNTHROID, LEVOTHROID) 100 MCG TABLET    Take 1 tablet (100 mcg total) by mouth daily before breakfast. For thyroid   MULTIPLE VITAMIN (TAB-A-VITE PO)    Take 1 tablet by mouth daily.   OMEGA-3 ACID ETHYL ESTERS (LOVAZA) 1 G CAPSULE    Take 1 g by mouth daily. Fatty Acid one daily   PHENYTOIN (DILANTIN) 100 MG ER  CAPSULE    Take 1 capsule by mouth in  the morning and 2 capsules  by mouth at bedtime to prevent seizure.  Modified Medications   No medications on file  Discontinued Medications   FEEDING SUPPLEMENT (BOOST / RESOURCE BREEZE) LIQD    Take 1 Container by mouth 2 (two) times daily between meals.     Review of Systems  Constitutional: Negative for diaphoresis.  HENT: Positive for hearing loss. Negative for congestion, ear discharge, ear pain, nosebleeds, sore throat and tinnitus.        Bilateral hearing loss. June 2015 ruptured right Tm with drainage, but little pain.  Eyes: Negative.   Respiratory: Positive for shortness of breath.        History of dyspnea on exertion. Remains on oxygen 24/7 at this time. History of pulmonary embolism and Greenfield filter. Remains on warfarin.  Cardiovascular: Negative.   Gastrointestinal: Negative.   Endocrine: Negative for polydipsia.  Genitourinary: Positive for frequency.       2-3x/night  Musculoskeletal: Positive for back pain, arthralgias (both hands) and gait problem (unstable using walker).       Osteoarthritis with complaints of pain in multiple areas. Neck and right shoulder pains.  [place] 05/20/15 C/o back pain  Skin:       Senile ecchymoses  Allergic/Immunologic: Negative for environmental allergies.  Neurological: Positive for weakness. Negative for dizziness, tremors and seizures.       Hx seizures.   Psychiatric/Behavioral: Positive for confusion, dysphoric mood and decreased concentration. Negative for suicidal ideas. The patient is nervous/anxious.        Chronic anxiety. Memory deficits.    Vitals:   12/11/15 1421  BP: 123/69  Pulse: (!) 58  Resp: 20  Temp: 98.4 F (36.9 C)  SpO2: 94%  Weight: 135 lb (61.2 kg)  Height: 5' (1.524 m)   Wt Readings from Last 3 Encounters:  12/11/15 135 lb (61.2 kg)  10/09/15 136 lb (61.7 kg)  09/25/15 133 lb (60.3 kg)    Body mass index is 26.37 kg/m.  Physical Exam    Constitutional: She is oriented to person, place, and time. She appears well-developed and well-nourished. No distress.  HENT:  Bilateral hearing loss. Rupture of the right TM.   Eyes: Conjunctivae and EOM are normal. Pupils are equal, round, and reactive to light.  Lens implants in both eyes  Neck: Normal range of motion. Neck supple. No JVD present. No tracheal deviation present. No thyromegaly present.  Cardiovascular: Normal  rate, regular rhythm, normal heart sounds and intact distal pulses.  Exam reveals no gallop and no friction rub.   No murmur heard. Pulmonary/Chest: Effort normal and breath sounds normal. No respiratory distress. She has no wheezes. She exhibits no tenderness.  Abdominal: Soft. Bowel sounds are normal. She exhibits no distension and no mass. There is no tenderness.  Genitourinary: Guaiac negative stool.  From previous exam: Hemorrhoids. Normal sphincter tone. Stools are normal brown color.  Musculoskeletal: Normal range of motion. She exhibits no edema or tenderness.  Tender in the posterior neck. Mild restriction in movements. Hammertoes at the left second and third. Right second toe overlaps the great toe. SP surgical repair of the right 2nd toe.  Shoulders sore. Painful in the right shoulder when she lifts overhead and rotates at the shpoulder.[facility] July 2014. Pain in both hands and significant deformities related to OA.  Unstable gait. Using walker with 2 front wheels and rear skids. Weaker grip of the right hand.. Tender in the left SI joint.  Lymphadenopathy:    She has no cervical adenopathy.  Neurological: She is alert and oriented to person, place, and time. No cranial nerve deficit. Coordination normal.  No loss of grip strength. Loss of memory. 04/04/14 MMSE 25/30. Passed clock drawing. 07/04/14 MMSE 28/30. Failed clock drawing. Bilateral tremor.  Skin: Skin is warm and dry. No rash noted. No erythema. No pallor.  Psychiatric: Thought content normal.   depressed     Labs reviewed: Lab Summary Latest Ref Rng & Units 09/27/2015 04/05/2015 04/04/2015 04/03/2015 04/02/2015 04/01/2015 03/31/2015  Hemoglobin 12.0 - 15.0 g/dL (None) (None) 11.6(L) (None) 10.8(L) 11.3(L) 11.0(L)  Hematocrit 36.0 - 46.0 % (None) (None) 35.9(L) (None) 33.7(L) 35.7(L) 35.1(L)  White count 4.0 - 10.5 K/uL (None) (None) 8.6 (None) 7.6 8.3 9.0  Platelet count 150 - 400 K/uL (None) (None) 255 (None) 233 249 260  Sodium 137 - 147 mmol/L 141 142 (None) 141 139 (None) 140  Potassium 3.4 - 5.3 mmol/L 4.2 4.8 (None) 3.9 4.1 (None) 4.2  Calcium 8.9 - 10.3 mg/dL (None) (None) (None) 8.9 8.8(L) (None) 8.8(L)  Phosphorus 2.5 - 4.6 mg/dL (None) (None) (None) (None) (None) (None) (None)  Creatinine 0.5 - 1.1 mg/dL 0.6 0.5 (None) 0.58 0.64 (None) 0.55  AST 13 - 35 U/L 18 (None) (None) (None) (None) (None) (None)  Alk Phos 25 - 125 U/L 84 (None) (None) (None) (None) (None) (None)  Bilirubin 0.3 - 1.2 mg/dL (None) (None) (None) (None) (None) (None) (None)  Glucose mg/dL 88 107 (None) 103(H) 105(H) (None) 90  Cholesterol - (None) (None) (None) (None) (None) (None) (None)  HDL cholesterol - (None) (None) (None) (None) (None) (None) (None)  Triglycerides - (None) (None) (None) (None) (None) (None) (None)  LDL Direct - (None) (None) (None) (None) (None) (None) (None)  LDL Calc - (None) (None) (None) (None) (None) (None) (None)  Total protein 6.5 - 8.1 g/dL (None) (None) (None) (None) (None) (None) (None)  Albumin 3.5 - 5.0 g/dL (None) (None) (None) (None) (None) (None) (None)  Some recent data might be hidden   Lab Results  Component Value Date   TSH 1.96 09/27/2015   Lab Results  Component Value Date   BUN 29 (A) 09/27/2015   BUN 18 04/05/2015   BUN 10 04/03/2015   Lab Results  Component Value Date   CREATININE 0.6 09/27/2015   CREATININE 0.5 04/05/2015   CREATININE 0.58 04/03/2015   Lab Results  Component Value Date   HGBA1C 5.4 03/08/2015   HGBA1C  5.5 11/21/2013    HGBA1C 5.4 08/08/2013       Assessment/Plan  1. Depression - citalopram (CELEXA) 10 MG tablet; One daily to treat depression and anxiety  Dispense: 30 tablet; Refill: 5  2. Unstable gait Continue walker  3. Hypoxemic respiratory failure, chronic (HCC) Discontinue oxygen  4. Seizure disorder ([place]) Continue current dose phenytoin  5. Other chronic pulmonary embolism without acute cor pulmonale (HCC) Continue apixaban  6. Primary osteoarthritis of both hands Try Aspercream 4 times daily

## 2015-12-13 DIAGNOSIS — M129 Arthropathy, unspecified: Secondary | ICD-10-CM | POA: Diagnosis not present

## 2015-12-13 DIAGNOSIS — I10 Essential (primary) hypertension: Secondary | ICD-10-CM | POA: Diagnosis not present

## 2015-12-13 DIAGNOSIS — D649 Anemia, unspecified: Secondary | ICD-10-CM | POA: Diagnosis not present

## 2015-12-13 LAB — CBC AND DIFFERENTIAL
Hemoglobin: 14.1 g/dL (ref 12.0–16.0)
PLATELETS: 190 10*3/uL (ref 150–399)
WBC: 6.1 10*3/mL

## 2015-12-13 LAB — BASIC METABOLIC PANEL
BUN: 20 mg/dL (ref 4–21)
GLUCOSE: 88 mg/dL
Potassium: 4.4 mmol/L (ref 3.4–5.3)
SODIUM: 142 mmol/L (ref 137–147)

## 2015-12-27 DIAGNOSIS — N390 Urinary tract infection, site not specified: Secondary | ICD-10-CM | POA: Diagnosis not present

## 2016-01-01 ENCOUNTER — Encounter: Payer: Self-pay | Admitting: Nurse Practitioner

## 2016-01-01 ENCOUNTER — Non-Acute Institutional Stay: Payer: Medicare Other | Admitting: Nurse Practitioner

## 2016-01-01 DIAGNOSIS — R413 Other amnesia: Secondary | ICD-10-CM | POA: Diagnosis not present

## 2016-01-01 DIAGNOSIS — M549 Dorsalgia, unspecified: Secondary | ICD-10-CM

## 2016-01-01 DIAGNOSIS — F32A Depression, unspecified: Secondary | ICD-10-CM

## 2016-01-01 DIAGNOSIS — E039 Hypothyroidism, unspecified: Secondary | ICD-10-CM

## 2016-01-01 DIAGNOSIS — G40909 Epilepsy, unspecified, not intractable, without status epilepticus: Secondary | ICD-10-CM

## 2016-01-01 DIAGNOSIS — F329 Major depressive disorder, single episode, unspecified: Secondary | ICD-10-CM | POA: Diagnosis not present

## 2016-01-01 NOTE — Progress Notes (Signed)
Location:    [place] [facility] Number: 36 Place of Service:  ALF [phone]) Provider:  Marlana Latus  NP    Patient Care Team: Estill Dooms, MD as PCP - General (Internal Medicine) [facility] [name], MD as Consulting Physician (Orthopedic Surgery) [name], MD as Consulting Physician (Physical Medicine and Rehabilitation) Melina Schools, MD as Consulting Physician (Orthopedic Surgery) [name], MD as Consulting Physician (Otolaryngology) Wael Maestas Otho Darner, NP as Nurse Practitioner (Internal Medicine)  Extended Emergency Contact Information Primary Emergency Contact: Ripoll,[name] Address: [address], [place] Phone: [phone] Mobile Phone: [phone] Relation: Son Secondary Emergency Contact: [name] [place] Phone: [phone] Mobile Phone: [phone] Relation: Daughter  Code Status:  DNR Goals of care: Advanced Directive information Advanced Directives 01/01/2016  Does patient have an advance directive? Yes  Type of Paramedic of [place];Out of facility DNR (pink MOST or yellow form)  Does patient want to make changes to advanced directive? No - Patient declined  Copy of advanced directive(s) in chart? Yes  Pre-existing out of facility DNR order (yellow form or pink MOST form) -     Chief Complaint  Patient presents with  . Acute Visit    still having low back pain    HPI:  Pt is a 82 y.o. female seen today for an acute visit for worsened chronic lower back pain, stated pain at night and morning when she tries to get out of bed, positional, on Mobic 7.'5mg'$  daily, Tylenol qam,  negative urine culture for UTI   Hx of Seizure, on Dilantin '100mg'$  am '200mg'$  hs, no active seizure activity since last visited, her mood is managed with Celexa '10mg'$ , hypothyroidism, on Levothyroxine 159mg, last TSH 09/27/15 TSH 1.96    dementia, resides at AL,  takign Aricept '10mg'$  daily, forgetful. Hx of PE, on Eliquis       Past Medical History:  Diagnosis Date  . Anemia, unspecified 03/18/2011  . Anxiety state, unspecified 01/2011  . Blood in stool 06/15/2012  . Corns and callosities 07/01/2011  . Diverticulosis 02/2011  . Dizziness   . DJD (degenerative joint disease)   . Dysuria 06/17/2011  . Hemorrhoids 02/2011  . Low back pain   . Mitral valve problem thickened   thickened  . Osteoarthritis of both hands 10/09/2015  . Osteoporosis 02/2011  . Other abnormal blood chemistry 0/30/2012  . Other acquired deformity of toe 12/16/2011  . Pain in joint, site unspecified 01/2012  . 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 Sacroiliitis, not elsewhere classified ([place] 05/2011  . Seizures ([place]    Remotely  . Unspecified hypothyroidism 01/2011  . 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 Vitamin D deficiency 01/2011   Past Surgical History:  Procedure Laterality Date  . bletheroplasty    . CATARACT EXTRACTION W/ INTRAOCULAR LENS  IMPLANT, BILATERAL  2010  . CERVICAL LAMINECTOMY  2005  . HAMMER TOE SURGERY  2013   right 2nd toe Dr. HMallie Mussel . JOINT REPLACEMENT Bilateral 2002 Right, 1992 Left   knees    Allergies  Allergen Reactions  . Macrodantin [Nitrofurantoin]   . Morphine And Related Other (See Comments)    Altered mental state   . Sulfa Antibiotics Other (See Comments)    Reaction: unknown      Medication List  Accurate as of 01/01/16 11:59 PM. Always use your most recent med list.          acetaminophen 325 MG tablet Commonly known as:  TYLENOL Take 650 mg by mouth every morning.   amoxicillin 500 MG capsule Commonly known as:  AMOXIL 500 mg. Take 4 capsule by mouth 1 hour before procedure as needed.   apixaban 5 MG Tabs tablet Commonly known as:  ELIQUIS One twice daily for anticoagulation5   BREO ELLIPTA 100-25 MCG/INH Aepb Generic drug:  fluticasone  furoate-vilanterol Inhale 1 puff into the lungs daily.   CALCIUM 600+D 600-400 MG-UNIT tablet Generic drug:  Calcium Carbonate-Vitamin D Take 1 tablet by mouth daily.   citalopram 10 MG tablet Commonly known as:  CELEXA One daily to treat depression and anxiety   clonazePAM 0.5 MG tablet Commonly known as:  KLONOPIN Take 1 tablet (0.5 mg total) by mouth 2 (two) times daily as needed (anxiety).   donepezil 10 MG tablet Commonly known as:  ARICEPT Take 10 mg by mouth daily. Reported on 07/10/2015   levothyroxine 100 MCG tablet Commonly known as:  SYNTHROID, LEVOTHROID Take 1 tablet (100 mcg total) by mouth daily before breakfast. For thyroid   meloxicam 7.5 MG tablet Commonly known as:  MOBIC Take 7.5 mg by mouth daily.   phenytoin 100 MG ER capsule Commonly known as:  DILANTIN Take 1 capsule by mouth in  the morning and 2 capsules  by mouth at bedtime to prevent seizure.   TAB-A-VITE PO Take 1 tablet by mouth daily.   trolamine salicylate 10 % cream Commonly known as:  ASPERCREME Apply 1 application topically 4 (four) times daily.   Vitamin D-3 5000 UNITS Tabs Take 5,000 Units by mouth daily.       Review of Systems  Constitutional: Negative for diaphoresis.  HENT: Positive for hearing loss. Negative for congestion, ear discharge, ear pain, nosebleeds, sore throat and tinnitus.        Bilateral hearing loss. June 2015 ruptured right Tm with drainage, but little pain.  Eyes: Negative.   Respiratory: Positive for shortness of breath.        History of dyspnea on exertion. Remains on oxygen 24/7 at this time. History of pulmonary embolism and Greenfield filter. Remains on warfarin.  Cardiovascular: Negative.   Gastrointestinal: Negative.   Endocrine: Negative for polydipsia.  Genitourinary: Positive for frequency.       2-3x/night  Musculoskeletal: Positive for arthralgias (both hands), back pain and gait problem (unstable using walker).       Osteoarthritis  with complaints of pain in multiple areas. Neck and right shoulder pains.  [place] 05/20/15 C/o back pain C/o pain in fingers  Skin:       Senile ecchymoses  Allergic/Immunologic: Negative for environmental allergies.  Neurological: Positive for weakness. Negative for dizziness, tremors and seizures.       Hx seizures.   Psychiatric/Behavioral: Positive for confusion, decreased concentration and dysphoric mood. Negative for suicidal ideas. The patient is nervous/anxious.        Chronic anxiety. Memory deficits.    Immunization History  Administered Date(s) Administered  . Influenza Whole 03/17/2007, 02/16/2009, 01/30/2010, 02/16/2013  . Influenza-Unspecified 03/02/2014, 02/22/2015  . PPD Test 07/09/2010  . Pneumococcal Polysaccharide-23 05/19/2005, 05/23/2013  . Td 05/19/2005  . Tetanus 10/06/2012   Pertinent  Health Maintenance Due  Topic Date Due  . DEXA SCAN  11/19/1994  . PNA vac Low Risk Adult (2 of 2 - PCV13) 05/23/2014  . INFLUENZA  VACCINE  12/18/2015   Fall Risk  10/09/2015 07/10/2015 03/26/2015 12/19/2014 03/22/2013  Falls in the past year? No Yes Yes Yes Yes  Number falls in past yr: - '1 1 1 2 '$ or more  Injury with Fall? - Yes No Yes -   Functional Status Survey:    Vitals:   01/01/16 1238  BP: (!) 99/58  Pulse: 60  Resp: 17  Temp: 97.2 F (36.2 C)  Weight: 135 lb (61.2 kg)  Height: 5' (1.524 m)   Body mass index is 26.37 kg/m. Physical Exam  Constitutional: She is oriented to person, place, and time. She appears well-developed and well-nourished. No distress.  HENT:  Bilateral hearing loss. Rupture of the right TM.   Eyes: Conjunctivae and EOM are normal. Pupils are equal, round, and reactive to light.  Lens implants in both eyes  Neck: Normal range of motion. Neck supple. No JVD present. No tracheal deviation present. No thyromegaly present.  Cardiovascular: Normal rate, regular rhythm, normal heart sounds and intact distal pulses.  Exam reveals no gallop and  no friction rub.   No murmur heard. Pulmonary/Chest: Effort normal and breath sounds normal. No respiratory distress. She has no wheezes. She exhibits no tenderness.  Abdominal: Soft. Bowel sounds are normal. She exhibits no distension and no mass. There is no tenderness.  Genitourinary: Rectal exam shows guaiac negative stool.  Genitourinary Comments: From previous exam: Hemorrhoids. Normal sphincter tone. Stools are normal brown color.  Musculoskeletal: Normal range of motion. She exhibits tenderness and deformity. She exhibits no edema.  Tender in the posterior neck. Mild restriction in movements. Hammertoes at the left second and third. Right second toe overlaps the great toe. SP surgical repair of the right 2nd toe.  Shoulders sore. Painful in the right shoulder when she lifts overhead and rotates at the shpoulder.[facility] July 2014. Pain in both hands and significant deformities related to OA.  Unstable gait. Using walker with 2 front wheels and rear skids. Weaker grip of the right hand.. Tender in the left SI joint. Tender in all enlarged finger joints.   Lymphadenopathy:    She has no cervical adenopathy.  Neurological: She is alert and oriented to person, place, and time. No cranial nerve deficit. Coordination normal.  No loss of grip strength. Loss of memory. 04/04/14 MMSE 25/30. Passed clock drawing. 07/04/14 MMSE 28/30. Failed clock drawing. Bilateral tremor.  Skin: Skin is warm and dry. No rash noted. No erythema. No pallor.  Psychiatric: Thought content normal.  depressed    Labs reviewed:  Recent Labs  03/29/15 0232  03/31/15 0245 04/02/15 0553 04/03/15 0655 04/05/15 09/27/15  NA 137  < > 140 139 141 142 141  K 4.3  < > 4.2 4.1 3.9 4.8 4.2  CL 104  < > 103 103 103  --   --   CO2 24  < > '28 26 31  '$ --   --   GLUCOSE 159*  < > 90 105* 103*  --   --   BUN 15  < > '9 11 10 18 '$ 29*  CREATININE 0.64  < > 0.55 0.64 0.58 0.5 0.6  CALCIUM 8.3*  < > 8.8* 8.8* 8.9  --   --     MG 2.0  --   --  1.9  --   --   --   PHOS 4.2  --   --   --   --   --   --   < > =  values in this interval not displayed.  Recent Labs  03/11/15 1838 03/23/15 03/29/15 1045 09/27/15  AST 22 26 33 18  ALT '15 19 25 13  '$ ALKPHOS 62 83 75 84  BILITOT 0.6  --  0.1*  --   PROT 6.4*  --  6.2*  --   ALBUMIN 3.3*  --  2.3*  --     Recent Labs  01/11/15 1634  03/11/15 1838  03/28/15 1138  04/01/15 0536 04/02/15 0553 04/04/15 0519  WBC 5.5  < > 13.0*  < > 12.3*  < > 8.3 7.6 8.6  NEUTROABS 3.6  --  11.8*  --  8.3*  --   --   --   --   HGB 12.8  < > 12.5  < > 12.7  < > 11.3* 10.8* 11.6*  HCT 38.7  < > 38.3  < > 38.8  < > 35.7* 33.7* 35.9*  MCV 99.5  < > 98.2  < > 98.7  < > 98.3 99.7 99.4  PLT 195  < > 157  < > 255  < > 249 233 255  < > = values in this interval not displayed. Lab Results  Component Value Date   TSH 1.96 09/27/2015   Lab Results  Component Value Date   HGBA1C 5.4 03/08/2015   Lab Results  Component Value Date   CHOL 235 (H) 03/06/2009   HDL 108.40 03/06/2009   LDLDIRECT 108.2 03/06/2009    Significant Diagnostic Results in last 30 days:  No results found.  Assessment/Plan There are no diagnoses linked to this encounter.Hypothyroidism Continue Levothyroxine 169mg, 09/27/15 TSH 1.96   Seizure disorder ([place] Last seizure 2010 02/01/15 Dilantin lever 12.4(10-20) Continue Dilantin '100mg'$  am, '200mg'$  hs    Memory impairment- SNF/ assited living pt 03/20/15 MMSE 27/30, continue Aricept '10mg'$  daily.    Depression Sleeps well, mood is stable, continue Celexa '10mg'$  daily, Clonazepam 0.'5mg'$  bid, observe.     Back pain Livable, continue Mobic 7.'5mg'$  daily, increase Tylenol '650mg'$  to bid, may  lumbar spine/sacrum  X-ray if worsens.    Family/ staff Communication: continue AL for care assistance.   Labs/tests ordered:  none

## 2016-01-02 NOTE — Assessment & Plan Note (Signed)
Continue Levothyroxine 170mg, 09/27/15 TSH 1.96

## 2016-01-02 NOTE — Assessment & Plan Note (Signed)
Livable, continue Mobic 7.'5mg'$  daily, increase Tylenol '650mg'$  to bid, may  lumbar spine/sacrum  X-ray if worsens.

## 2016-01-02 NOTE — Assessment & Plan Note (Signed)
Last seizure 2010 02/01/15 Dilantin lever 12.4(10-20) Continue Dilantin '100mg'$  am, '200mg'$  hs

## 2016-01-02 NOTE — Assessment & Plan Note (Signed)
03/20/15 MMSE 27/30, continue Aricept '10mg'$  daily.

## 2016-01-02 NOTE — Assessment & Plan Note (Signed)
Sleeps well, mood is stable, continue Celexa '10mg'$  daily, Clonazepam 0.'5mg'$  bid, observe.

## 2016-01-10 ENCOUNTER — Non-Acute Institutional Stay: Payer: Medicare Other | Admitting: Internal Medicine

## 2016-01-10 ENCOUNTER — Encounter: Payer: Self-pay | Admitting: Internal Medicine

## 2016-01-10 DIAGNOSIS — F329 Major depressive disorder, single episode, unspecified: Secondary | ICD-10-CM | POA: Diagnosis not present

## 2016-01-10 DIAGNOSIS — R2681 Unsteadiness on feet: Secondary | ICD-10-CM | POA: Diagnosis not present

## 2016-01-10 DIAGNOSIS — J9611 Chronic respiratory failure with hypoxia: Secondary | ICD-10-CM | POA: Diagnosis not present

## 2016-01-10 DIAGNOSIS — R413 Other amnesia: Secondary | ICD-10-CM

## 2016-01-10 DIAGNOSIS — F32A Depression, unspecified: Secondary | ICD-10-CM

## 2016-01-10 DIAGNOSIS — E039 Hypothyroidism, unspecified: Secondary | ICD-10-CM | POA: Diagnosis not present

## 2016-01-10 NOTE — Progress Notes (Signed)
Progress Note    Location:   [place] Room Number: [place] of Service:  ALF [phone]) Provider:  Jeanmarie Hubert, MD  Patient Care Team: Estill Dooms, MD as PCP - General (Internal Medicine) [facility] [name], MD as Consulting Physician (Orthopedic Surgery) [name], MD as Consulting Physician (Physical Medicine and Rehabilitation) Melina Schools, MD as Consulting Physician (Orthopedic Surgery) [name], MD as Consulting Physician (Otolaryngology) Man Otho Darner, NP as Nurse Practitioner (Internal Medicine)  Extended Emergency Contact Information Primary Emergency Contact: Freels,[name] Address: [address], [place] of [place] Phone: [phone] Mobile Phone: [phone] Relation: Son Secondary Emergency Contact: [name] [place] of [place] Phone: [phone] Mobile Phone: [phone] Relation: Daughter  Code Status:  DNR Goals of care: Advanced Directive information Advanced Directives 01/10/2016  Does patient have an advance directive? Yes  Type of Paramedic of [place];Living will;Out of facility DNR (pink MOST or yellow form)  Does patient want to make changes to advanced directive? -  Copy of advanced directive(s) in chart? Yes  Pre-existing out of facility DNR order (yellow form or pink MOST form) -     Chief Complaint  Patient presents with  . Medical Management of Chronic Issues    routine    HPI:  Pt is a 82 y.o. female seen today for medical management of chronic diseases.    Memory impairment- SNF/ assited living pt - Continues to have problems with short-term recall and occasionally long-term memory.  Depression - continues to dwell on things that she cannot do such as driving.  says that she is very unhappy living in the assisted living area. Says that she is not living like a "human being". Currently being treated with Celexa 10 mg  .  Hypothyroidism, unspecified hypothyroidism type - compensated -   Hypoxemic respiratory failure, chronic ([place]) - doing much better with her breathing. She is able walk down the hallway without oxygen.    Unstable gait - she has a walker that she uses reluctantly. States that she hates it.     Past Medical History:  Diagnosis Date  . Anemia, unspecified 03/18/2011  . Anxiety state, unspecified 01/2011  . Blood in stool 06/15/2012  . Corns and callosities 07/01/2011  . Diverticulosis 02/2011  . Dizziness   . DJD (degenerative joint disease)   . Dysuria 06/17/2011  . Hemorrhoids 02/2011  . Low back pain   . Mitral valve problem thickened   thickened  . Osteoarthritis of both hands 10/09/2015  . Osteoporosis 02/2011  . Other abnormal blood chemistry 0/30/2012  . Other acquired deformity of toe 12/16/2011  . Pain in joint, site unspecified 01/2012  . 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 Sacroiliitis, not elsewhere classified ([place]) 05/2011  . Seizures ([place])    Remotely  . Unspecified hypothyroidism 01/2011  . 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 Vitamin D deficiency 01/2011   Past Surgical History:  Procedure Laterality Date  . bletheroplasty    . CATARACT EXTRACTION W/ INTRAOCULAR LENS  IMPLANT, BILATERAL  2010  . CERVICAL LAMINECTOMY  2005  . HAMMER TOE SURGERY  2013   right 2nd toe Dr. Mallie Mussel  . JOINT REPLACEMENT Bilateral 2002 Right, 1992 Left   knees    Allergies  Allergen Reactions  . Macrodantin [Nitrofurantoin]   . Morphine And Related Other (  See Comments)    Altered mental state   . Sulfa Antibiotics Other (See Comments)    Reaction: unknown      Medication List       Accurate as of 01/10/16 12:22 PM. Always use your most recent med list.          acetaminophen 325 MG tablet Commonly known as:  TYLENOL Take 325 mg by mouth. Take 2 tablets twice daily   amoxicillin 500 MG capsule Commonly known  as:  AMOXIL 500 mg. Take 4 capsule by mouth 1 hour before procedure as needed.   apixaban 5 MG Tabs tablet Commonly known as:  ELIQUIS One twice daily for anticoagulation5   BREO ELLIPTA 100-25 MCG/INH Aepb Generic drug:  fluticasone furoate-vilanterol Inhale 1 puff into the lungs daily.   CALCIUM 600+D 600-400 MG-UNIT tablet Generic drug:  Calcium Carbonate-Vitamin D Take 1 tablet by mouth daily.   citalopram 10 MG tablet Commonly known as:  CELEXA One daily to treat depression and anxiety   clonazePAM 0.5 MG tablet Commonly known as:  KLONOPIN Take 1 tablet (0.5 mg total) by mouth 2 (two) times daily as needed (anxiety).   donepezil 10 MG tablet Commonly known as:  ARICEPT Take 10 mg by mouth daily. Reported on 07/10/2015   levothyroxine 100 MCG tablet Commonly known as:  SYNTHROID, LEVOTHROID Take 1 tablet (100 mcg total) by mouth daily before breakfast. For thyroid   meloxicam 7.5 MG tablet Commonly known as:  MOBIC Take 7.5 mg by mouth daily.   Omega-3 1000 MG Caps Take by mouth. Take one capsule every morning   phenytoin 100 MG ER capsule Commonly known as:  DILANTIN Take 100 mg by mouth. Take one capsule every morning; take 2 capsules at bedtime   TAB-A-VITE PO Take 1 tablet by mouth daily.   trolamine salicylate 10 % cream Commonly known as:  ASPERCREME Apply 1 application topically 4 (four) times daily.   Vitamin D-3 5000 UNITS Tabs Take 5,000 Units by mouth daily.       Review of Systems  Constitutional: Negative for diaphoresis.  HENT: Positive for hearing loss. Negative for congestion, ear discharge, ear pain, nosebleeds, sore throat and tinnitus.        Bilateral hearing loss. June 2015 ruptured right Tm with drainage, but little pain.  Eyes: Negative.   Respiratory: Positive for shortness of breath.        History of dyspnea on exertion. Remains on oxygen 24/7 at this time. History of pulmonary embolism and Greenfield filter. Remains on  warfarin.  Cardiovascular: Negative.   Gastrointestinal: Negative.   Endocrine: Negative for polydipsia.  Genitourinary: Positive for frequency.       2-3x/night  Musculoskeletal: Positive for arthralgias (both hands), back pain and gait problem (unstable using walker).       Osteoarthritis with complaints of pain in multiple areas. Neck and right shoulder pains.  [place] 05/20/15 C/o back pain  Skin:       Senile ecchymoses  Allergic/Immunologic: Negative for environmental allergies.  Neurological: Positive for weakness. Negative for dizziness, tremors and seizures.       Hx seizures.   Psychiatric/Behavioral: Positive for confusion, decreased concentration and dysphoric mood. Negative for suicidal ideas. The patient is nervous/anxious.        Chronic anxiety. Memory deficits.    Immunization History  Administered Date(s) Administered  . Influenza Whole 03/17/2007, 02/16/2009, 01/30/2010, 02/16/2013  . Influenza-Unspecified 03/02/2014, 02/22/2015  . PPD Test 07/09/2010  . Pneumococcal Polysaccharide-23 05/19/2005,  05/23/2013  . Td 05/19/2005  . Tetanus 10/06/2012   Pertinent  Health Maintenance Due  Topic Date Due  . DEXA SCAN  11/19/1994  . PNA vac Low Risk Adult (2 of 2 - PCV13) 05/23/2014  . INFLUENZA VACCINE  12/18/2015   Fall Risk  10/09/2015 07/10/2015 03/26/2015 12/19/2014 03/22/2013  Falls in the past year? No Yes Yes Yes Yes  Number falls in past yr: - '1 1 1 2 '$ or more  Injury with Fall? - Yes No Yes -   Functional Status Survey:    Vitals:   01/10/16 1208  BP: 103/64  Pulse: 67  Resp: 18  Temp: 97.4 F (36.3 C)  Weight: 134 lb (60.8 kg)  Height: 5' (1.524 m)   Body mass index is 26.17 kg/m. Physical Exam  Constitutional: She is oriented to person, place, and time. She appears well-developed and well-nourished. No distress.  HENT:  Bilateral hearing loss. Rupture of the right TM.   Eyes: Conjunctivae and EOM are normal. Pupils are equal, round, and reactive to  light.  Lens implants in both eyes  Neck: Normal range of motion. Neck supple. No JVD present. No tracheal deviation present. No thyromegaly present.  Cardiovascular: Normal rate, regular rhythm, normal heart sounds and intact distal pulses.  Exam reveals no gallop and no friction rub.   No murmur heard. Pulmonary/Chest: Effort normal and breath sounds normal. No respiratory distress. She has no wheezes. She exhibits no tenderness.  Abdominal: Soft. Bowel sounds are normal. She exhibits no distension and no mass. There is no tenderness.  Genitourinary: Rectal exam shows guaiac negative stool.  Genitourinary Comments: From previous exam: Hemorrhoids. Normal sphincter tone. Stools are normal brown color.  Musculoskeletal: Normal range of motion. She exhibits tenderness and deformity. She exhibits no edema.  Tender in the posterior neck. Mild restriction in movements. Hammertoes at the left second and third. Right second toe overlaps the great toe. SP surgical repair of the right 2nd toe.  Shoulders sore. Painful in the right shoulder when she lifts overhead and rotates at the shpoulder.[facility] July 2014. Pain in both hands and significant deformities related to OA.  Unstable gait. Using walker with 2 front wheels and rear skids. Weaker grip of the right hand.. Tender in the left SI joint. Tender in all enlarged finger joints.   Lymphadenopathy:    She has no cervical adenopathy.  Neurological: She is alert and oriented to person, place, and time. No cranial nerve deficit. Coordination normal.  No loss of grip strength. Loss of memory. 04/04/14 MMSE 25/30. Passed clock drawing. 07/04/14 MMSE 28/30. Failed clock drawing. Bilateral tremor.  Skin: Skin is warm and dry. No rash noted. No erythema. No pallor.  Psychiatric: Thought content normal.  depressed    Labs reviewed:  Recent Labs  03/29/15 0232  03/31/15 0245 04/02/15 0553 04/03/15 0655 04/05/15 09/27/15  NA 137  < > 140 139 141  142 141  K 4.3  < > 4.2 4.1 3.9 4.8 4.2  CL 104  < > 103 103 103  --   --   CO2 24  < > '28 26 31  '$ --   --   GLUCOSE 159*  < > 90 105* 103*  --   --   BUN 15  < > '9 11 10 18 '$ 29*  CREATININE 0.64  < > 0.55 0.64 0.58 0.5 0.6  CALCIUM 8.3*  < > 8.8* 8.8* 8.9  --   --   MG 2.0  --   --  1.9  --   --   --   PHOS 4.2  --   --   --   --   --   --   < > = values in this interval not displayed.  Recent Labs  03/11/15 1838 03/23/15 03/29/15 1045 09/27/15  AST 22 26 33 18  ALT '15 19 25 13  '$ ALKPHOS 62 83 75 84  BILITOT 0.6  --  0.1*  --   PROT 6.4*  --  6.2*  --   ALBUMIN 3.3*  --  2.3*  --     Recent Labs  01/11/15 1634  03/11/15 1838  03/28/15 1138  04/01/15 0536 04/02/15 0553 04/04/15 0519  WBC 5.5  < > 13.0*  < > 12.3*  < > 8.3 7.6 8.6  NEUTROABS 3.6  --  11.8*  --  8.3*  --   --   --   --   HGB 12.8  < > 12.5  < > 12.7  < > 11.3* 10.8* 11.6*  HCT 38.7  < > 38.3  < > 38.8  < > 35.7* 33.7* 35.9*  MCV 99.5  < > 98.2  < > 98.7  < > 98.3 99.7 99.4  PLT 195  < > 157  < > 255  < > 249 233 255  < > = values in this interval not displayed. Lab Results  Component Value Date   TSH 1.96 09/27/2015   Lab Results  Component Value Date   HGBA1C 5.4 03/08/2015   Lab Results  Component Value Date   CHOL 235 (H) 03/06/2009   HDL 108.40 03/06/2009   LDLDIRECT 108.2 03/06/2009     Assessment/Plan 1. Memory impairment- SNF/ assited living pt Currently on Aricept 10 mg daily. I'll add memantine and step up the dose and tissue she reaches an ultimate dosage of 10 mg twice daily  2. Depression Since we are adding memantine this visit, I elected to leave Celexa alone. Consider increasing dose in the future.  3. Hypothyroidism, unspecified hypothyroidism type Compensated  4. Hypoxemic respiratory failure, chronic (HCC) Improved  5. Unstable gait Encouraged her to continue use of walker for safety area

## 2016-01-29 ENCOUNTER — Non-Acute Institutional Stay: Payer: Medicare Other | Admitting: Nurse Practitioner

## 2016-01-29 ENCOUNTER — Encounter: Payer: Self-pay | Admitting: Nurse Practitioner

## 2016-01-29 DIAGNOSIS — R413 Other amnesia: Secondary | ICD-10-CM

## 2016-01-29 DIAGNOSIS — R21 Rash and other nonspecific skin eruption: Secondary | ICD-10-CM | POA: Diagnosis not present

## 2016-01-29 DIAGNOSIS — E039 Hypothyroidism, unspecified: Secondary | ICD-10-CM

## 2016-01-29 DIAGNOSIS — M5440 Lumbago with sciatica, unspecified side: Secondary | ICD-10-CM

## 2016-01-29 DIAGNOSIS — F329 Major depressive disorder, single episode, unspecified: Secondary | ICD-10-CM

## 2016-01-29 DIAGNOSIS — G40909 Epilepsy, unspecified, not intractable, without status epilepticus: Secondary | ICD-10-CM | POA: Diagnosis not present

## 2016-01-29 DIAGNOSIS — F32A Depression, unspecified: Secondary | ICD-10-CM

## 2016-01-29 NOTE — Progress Notes (Signed)
Location:   [place] Room Number: [id number] Place of Service:  [id number] [phone]) Provider:  Ula Couvillon, Manxie  NP  Jeanmarie Hubert, MD  Patient Care Team: Estill Dooms, MD as PCP - General (Internal Medicine) [facility] [name], MD as Consulting Physician (Orthopedic Surgery) [name], MD as Consulting Physician (Physical Medicine and Rehabilitation) Melina Schools, MD as Consulting Physician (Orthopedic Surgery) [name], MD as Consulting Physician (Otolaryngology) Amardeep Beckers Otho Darner, NP as Nurse Practitioner (Internal Medicine)  Extended Emergency Contact Information Primary Emergency Contact: Antonelli,[name] Address: [address], [place] Phone: [phone] Mobile Phone: [phone] Relation: Son Secondary Emergency Contact: [name] [place] Phone: [phone] Mobile Phone: [phone] Relation: Daughter  Code Status:  DNR Goals of care: Advanced Directive information Advanced Directives 01/29/2016  Does patient have an advance directive? Yes  Type of Paramedic of [place];Living will;Out of facility DNR (pink MOST or yellow form)  Does patient want to make changes to advanced directive? No - Patient declined  Copy of advanced directive(s) in chart? Yes  Pre-existing out of facility DNR order (yellow form or pink MOST form) -     Chief Complaint  Patient presents with  . Acute Visit    ? yeast rash around her neck    HPI:  Pt is a 82 y.o. female seen today for an acute visit for rash in neck, satellite pattern   Hx of Seizure, on Dilantin '100mg'$  am '200mg'$  hs, no active seizure activity since last visited, her mood is managed with Celexa '10mg'$ , hypothyroidism, on Levothyroxine 115mg, last TSH 09/27/15 TSH 1.96. Dementia, resides at AL, takign Aricept '10mg'$  daily, forgetful. Hx of PE, on Eliquis. Lower back pain, taking Mobic 7.'5mg'$ , Tylenol prn.    Past  Medical History:  Diagnosis Date  . Anemia, unspecified 03/18/2011  . Anxiety state, unspecified 01/2011  . Blood in stool 06/15/2012  . Corns and callosities 07/01/2011  . Diverticulosis 02/2011  . Dizziness   . DJD (degenerative joint disease)   . Dysuria 06/17/2011  . Hemorrhoids 02/2011  . Low back pain   . Mitral valve problem thickened   thickened  . Osteoarthritis of both hands 10/09/2015  . Osteoporosis 02/2011  . Other abnormal blood chemistry 0/30/2012  . Other acquired deformity of toe 12/16/2011  . Pain in joint, site unspecified 01/2012  . 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 Sacroiliitis, not elsewhere classified ([facility] 05/2011  . Seizures ([facility]    Remotely  . Unspecified hypothyroidism 01/2011  . 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 Vitamin D deficiency 01/2011   Past Surgical History:  Procedure Laterality Date  . bletheroplasty    . CATARACT EXTRACTION W/ INTRAOCULAR LENS  IMPLANT, BILATERAL  2010  . CERVICAL LAMINECTOMY  2005  . HAMMER TOE SURGERY  2013   right 2nd toe Dr. HMallie Mussel . JOINT REPLACEMENT Bilateral 2002 Right, 1992 Left   knees    Allergies  Allergen Reactions  . Macrodantin [Nitrofurantoin]   . Morphine And Related Other (See Comments)    Altered mental state   . Sulfa Antibiotics Other (See Comments)    Reaction: unknown      Medication List       Accurate as of 01/29/16  1:57 PM. Always use your most recent med list.  acetaminophen 325 MG tablet Commonly known as:  TYLENOL Take 325 mg by mouth. Take 2 tablets twice daily   amoxicillin 500 MG capsule Commonly known as:  AMOXIL 500 mg. Take 4 capsule by mouth 1 hour before procedure as needed.   apixaban 5 MG Tabs tablet Commonly known as:  ELIQUIS One twice daily for anticoagulation5   BREO ELLIPTA 100-25 MCG/INH Aepb Generic drug:  fluticasone furoate-vilanterol Inhale 1 puff into the lungs daily.   CALCIUM  600+D 600-400 MG-UNIT tablet Generic drug:  Calcium Carbonate-Vitamin D Take 1 tablet by mouth daily.   citalopram 10 MG tablet Commonly known as:  CELEXA One daily to treat depression and anxiety   clonazePAM 0.5 MG tablet Commonly known as:  KLONOPIN Take 1 tablet (0.5 mg total) by mouth 2 (two) times daily as needed (anxiety).   donepezil 10 MG tablet Commonly known as:  ARICEPT Take 10 mg by mouth daily. Reported on 07/10/2015   levothyroxine 100 MCG tablet Commonly known as:  SYNTHROID, LEVOTHROID Take 1 tablet (100 mcg total) by mouth daily before breakfast. For thyroid   meloxicam 7.5 MG tablet Commonly known as:  MOBIC Take 7.5 mg by mouth daily.   Omega-3 1000 MG Caps Take by mouth. Take one capsule every morning   phenytoin 100 MG ER capsule Commonly known as:  DILANTIN Take 100 mg by mouth. Take one capsule every morning; take 2 capsules at bedtime   TAB-A-VITE PO Take 1 tablet by mouth daily.   trolamine salicylate 10 % cream Commonly known as:  ASPERCREME Apply 1 application topically 4 (four) times daily.   Vitamin D-3 5000 UNITS Tabs Take 5,000 Units by mouth daily.       Review of Systems  Constitutional: Negative for diaphoresis.  HENT: Positive for hearing loss. Negative for congestion, ear discharge, ear pain, nosebleeds, sore throat and tinnitus.        Bilateral hearing loss. June 2015 ruptured right Tm with drainage, but little pain.  Eyes: Negative.   Respiratory: Positive for shortness of breath.        History of dyspnea on exertion. Remains on oxygen 24/7 at this time. History of pulmonary embolism and [place] filter. Remains on warfarin.  Cardiovascular: Negative.   Gastrointestinal: Negative.   Endocrine: Negative for polydipsia.  Genitourinary: Positive for frequency.       2-3x/night  Musculoskeletal: Positive for arthralgias (both hands), back pain and gait problem (unstable using walker).       Osteoarthritis with  complaints of pain in multiple areas. Neck and right shoulder pains.  [place] 05/20/15 C/o back pain  Skin: Positive for rash.       Senile ecchymoses  Allergic/Immunologic: Negative for environmental allergies.  Neurological: Positive for weakness. Negative for dizziness, tremors and seizures.       Hx seizures.   Psychiatric/Behavioral: Positive for confusion, decreased concentration and dysphoric mood. Negative for suicidal ideas. The patient is nervous/anxious.        Chronic anxiety. Memory deficits.    Immunization History  Administered Date(s) Administered  . Influenza Whole 03/17/2007, 02/16/2009, 01/30/2010, 02/16/2013  . Influenza-Unspecified 03/02/2014, 02/22/2015  . PPD Test 07/09/2010  . Pneumococcal Polysaccharide-23 05/19/2005, 05/23/2013  . Td 05/19/2005  . Tetanus 10/06/2012   Pertinent  Health Maintenance Due  Topic Date Due  . DEXA SCAN  11/19/1994  . PNA vac Low Risk Adult (2 of 2 - PCV13) 05/23/2014  . INFLUENZA VACCINE  12/18/2015   Fall Risk  10/09/2015 07/10/2015 03/26/2015  12/19/2014 03/22/2013  Falls in the past year? No Yes Yes Yes Yes  Number falls in past yr: - '1 1 1 2 '$ or more  Injury with Fall? - Yes No Yes -   Functional Status Survey:    Vitals:   01/29/16 1131  BP: (!) (P) 105/57  Pulse: (P) 64  Resp: (P) 17  Temp: (P) 97.9 F (36.6 C)  SpO2: (P) 96%  Weight: 137 lb (62.1 kg)  Height: 5' (1.524 m)   Body mass index is 26.76 kg/m. Physical Exam  Constitutional: She is oriented to person, place, and time. She appears well-developed and well-nourished. No distress.  HENT:  Bilateral hearing loss. Rupture of the right TM.   Eyes: Conjunctivae and EOM are normal. Pupils are equal, round, and reactive to light.  Lens implants in both eyes  Neck: Normal range of motion. Neck supple. No JVD present. No tracheal deviation present. No thyromegaly present.  Cardiovascular: Normal rate, regular rhythm, normal heart sounds and intact distal pulses.   Exam reveals no gallop and no friction rub.   No murmur heard. Pulmonary/Chest: Effort normal and breath sounds normal. No respiratory distress. She has no wheezes. She exhibits no tenderness.  Abdominal: Soft. Bowel sounds are normal. She exhibits no distension and no mass. There is no tenderness.  Genitourinary: Rectal exam shows guaiac negative stool.  Genitourinary Comments: From previous exam: Hemorrhoids. Normal sphincter tone. Stools are normal brown color.  Musculoskeletal: Normal range of motion. She exhibits tenderness and deformity. She exhibits no edema.  Tender in the posterior neck. Mild restriction in movements. Hammertoes at the left second and third. Right second toe overlaps the great toe. SP surgical repair of the right 2nd toe.  Shoulders sore. Painful in the right shoulder when she lifts overhead and rotates at the shpoulder.[facility] July 2014. Pain in both hands and significant deformities related to OA.  Unstable gait. Using walker with 2 front wheels and rear skids. Weaker grip of the right hand.. Tender in the left SI joint. Tender in all enlarged finger joints.   Lymphadenopathy:    She has no cervical adenopathy.  Neurological: She is alert and oriented to person, place, and time. No cranial nerve deficit. Coordination normal.  No loss of grip strength. Loss of memory. 04/04/14 MMSE 25/30. Passed clock drawing. 07/04/14 MMSE 28/30. Failed clock drawing. Bilateral tremor.  Skin: Skin is warm and dry. Rash noted. No erythema. No pallor.  neck  Psychiatric: Thought content normal.  depressed    Labs reviewed:  Recent Labs  03/29/15 0232  03/31/15 0245 04/02/15 0553 04/03/15 0655 04/05/15 09/27/15 12/13/15  NA 137  < > 140 139 141 142 141 142  K 4.3  < > 4.2 4.1 3.9 4.8 4.2 4.4  CL 104  < > 103 103 103  --   --   --   CO2 24  < > '28 26 31  '$ --   --   --   GLUCOSE 159*  < > 90 105* 103*  --   --   --   BUN 15  < > '9 11 10 18 '$ 29* 20  CREATININE 0.64  < > 0.55  0.64 0.58 0.5 0.6  --   CALCIUM 8.3*  < > 8.8* 8.8* 8.9  --   --   --   MG 2.0  --   --  1.9  --   --   --   --   PHOS 4.2  --   --   --   --   --   --   --   < > =  values in this interval not displayed.  Recent Labs  03/11/15 1838 03/23/15 03/29/15 1045 09/27/15  AST 22 26 33 18  ALT '15 19 25 13  '$ ALKPHOS 62 83 75 84  BILITOT 0.6  --  0.1*  --   PROT 6.4*  --  6.2*  --   ALBUMIN 3.3*  --  2.3*  --     Recent Labs  03/11/15 1838  03/28/15 1138  04/01/15 0536 04/02/15 0553 04/04/15 0519 12/13/15  WBC 13.0*  < > 12.3*  < > 8.3 7.6 8.6 6.1  NEUTROABS 11.8*  --  8.3*  --   --   --   --   --   HGB 12.5  < > 12.7  < > 11.3* 10.8* 11.6* 14.1  HCT 38.3  < > 38.8  < > 35.7* 33.7* 35.9*  --   MCV 98.2  < > 98.7  < > 98.3 99.7 99.4  --   PLT 157  < > 255  < > 249 233 255 190  < > = values in this interval not displayed. Lab Results  Component Value Date   TSH 1.96 09/27/2015   Lab Results  Component Value Date   HGBA1C 5.4 03/08/2015   Lab Results  Component Value Date   CHOL 235 (H) 03/06/2009   HDL 108.40 03/06/2009   LDLDIRECT 108.2 03/06/2009    Significant Diagnostic Results in last 30 days:  No results found.  Assessment/Plan There are no diagnoses linked to this encounter.Hypothyroidism Continue Levothyroxine 169mg, 09/27/15 TSH 1.96  Seizure disorder ([facility] Last seizure 2010 02/01/15 Dilantin lever 12.4(10-20) Continue Dilantin '100mg'$  am, '200mg'$  hs   Memory impairment- SNF/ assited living pt 03/20/15 MMSE 27/30, continue Aricept '10mg'$  daily.   Depression Sleeps well, mood is stable, continue Celexa '10mg'$  daily, Clonazepam 0.'5mg'$  bid, observe.     Back pain Livable, continue Mobic 7.'5mg'$  daily, increase Tylenol '650mg'$  to bid, may  lumbar spine/sacrum  X-ray if worsens.    Rash and nonspecific skin eruption Neck, will apply Mycolog II cream bid to affected area until healed.      Family/ staff Communication: continue AL for care assistance.    Labs/tests ordered:  none

## 2016-01-29 NOTE — Assessment & Plan Note (Signed)
Neck, will apply Mycolog II cream bid to affected area until healed.

## 2016-01-29 NOTE — Assessment & Plan Note (Signed)
Livable, continue Mobic 7.'5mg'$  daily, increase Tylenol '650mg'$  to bid, may  lumbar spine/sacrum  X-ray if worsens.

## 2016-01-29 NOTE — Assessment & Plan Note (Signed)
Last seizure 2010 02/01/15 Dilantin lever 12.4(10-20) Continue Dilantin '100mg'$  am, '200mg'$  hs

## 2016-01-29 NOTE — Assessment & Plan Note (Signed)
Continue Levothyroxine 148mg, 09/27/15 TSH 1.96

## 2016-01-29 NOTE — Assessment & Plan Note (Signed)
Sleeps well, mood is stable, continue Celexa '10mg'$  daily, Clonazepam 0.'5mg'$  bid, observe.

## 2016-01-29 NOTE — Assessment & Plan Note (Signed)
03/20/15 MMSE 27/30, continue Aricept '10mg'$  daily.

## 2016-02-01 DIAGNOSIS — D649 Anemia, unspecified: Secondary | ICD-10-CM | POA: Diagnosis not present

## 2016-02-01 DIAGNOSIS — I10 Essential (primary) hypertension: Secondary | ICD-10-CM | POA: Diagnosis not present

## 2016-02-02 DIAGNOSIS — R509 Fever, unspecified: Secondary | ICD-10-CM | POA: Diagnosis not present

## 2016-02-05 ENCOUNTER — Encounter: Payer: Self-pay | Admitting: Nurse Practitioner

## 2016-02-05 DIAGNOSIS — J069 Acute upper respiratory infection, unspecified: Secondary | ICD-10-CM | POA: Insufficient documentation

## 2016-03-05 ENCOUNTER — Encounter (HOSPITAL_COMMUNITY): Payer: Self-pay | Admitting: Emergency Medicine

## 2016-03-05 ENCOUNTER — Emergency Department (HOSPITAL_COMMUNITY)
Admission: EM | Admit: 2016-03-05 | Discharge: 2016-03-05 | Disposition: A | Discharge status: Home / Self Care | Payer: Medicare Other | Attending: Emergency Medicine | Admitting: Emergency Medicine

## 2016-03-05 ENCOUNTER — Emergency Department (HOSPITAL_COMMUNITY): Payer: Medicare Other

## 2016-03-05 DIAGNOSIS — S0180XA Unspecified open wound of other part of head, initial encounter: Secondary | ICD-10-CM | POA: Diagnosis not present

## 2016-03-05 DIAGNOSIS — Y939 Activity, unspecified: Secondary | ICD-10-CM | POA: Insufficient documentation

## 2016-03-05 DIAGNOSIS — S0001XA Abrasion of scalp, initial encounter: Secondary | ICD-10-CM | POA: Insufficient documentation

## 2016-03-05 DIAGNOSIS — Z96653 Presence of artificial knee joint, bilateral: Secondary | ICD-10-CM | POA: Insufficient documentation

## 2016-03-05 DIAGNOSIS — S199XXA Unspecified injury of neck, initial encounter: Secondary | ICD-10-CM | POA: Diagnosis not present

## 2016-03-05 DIAGNOSIS — Z7901 Long term (current) use of anticoagulants: Secondary | ICD-10-CM | POA: Diagnosis not present

## 2016-03-05 DIAGNOSIS — W228XXA Striking against or struck by other objects, initial encounter: Secondary | ICD-10-CM | POA: Diagnosis not present

## 2016-03-05 DIAGNOSIS — S0990XA Unspecified injury of head, initial encounter: Secondary | ICD-10-CM | POA: Diagnosis not present

## 2016-03-05 DIAGNOSIS — W19XXXA Unspecified fall, initial encounter: Secondary | ICD-10-CM

## 2016-03-05 DIAGNOSIS — Y92129 Unspecified place in nursing home as the place of occurrence of the external cause: Secondary | ICD-10-CM | POA: Diagnosis not present

## 2016-03-05 DIAGNOSIS — E039 Hypothyroidism, unspecified: Secondary | ICD-10-CM | POA: Diagnosis not present

## 2016-03-05 DIAGNOSIS — Z87891 Personal history of nicotine dependence: Secondary | ICD-10-CM | POA: Insufficient documentation

## 2016-03-05 DIAGNOSIS — Y999 Unspecified external cause status: Secondary | ICD-10-CM | POA: Insufficient documentation

## 2016-03-05 LAB — CBC WITH DIFFERENTIAL/PLATELET
Basophils Absolute: 0 10*3/uL (ref 0.0–0.1)
Basophils Relative: 0 %
EOS PCT: 4 %
Eosinophils Absolute: 0.3 10*3/uL (ref 0.0–0.7)
HEMATOCRIT: 39.4 % (ref 36.0–46.0)
Hemoglobin: 12.6 g/dL (ref 12.0–15.0)
LYMPHS ABS: 1.1 10*3/uL (ref 0.7–4.0)
LYMPHS PCT: 18 %
MCH: 31.8 pg (ref 26.0–34.0)
MCHC: 32 g/dL (ref 30.0–36.0)
MCV: 99.5 fL (ref 78.0–100.0)
MONO ABS: 0.6 10*3/uL (ref 0.1–1.0)
Monocytes Relative: 11 %
Neutro Abs: 3.9 10*3/uL (ref 1.7–7.7)
Neutrophils Relative %: 67 %
PLATELETS: 173 10*3/uL (ref 150–400)
RBC: 3.96 MIL/uL (ref 3.87–5.11)
RDW: 13.6 % (ref 11.5–15.5)
WBC: 5.9 10*3/uL (ref 4.0–10.5)

## 2016-03-05 LAB — URINALYSIS, ROUTINE W REFLEX MICROSCOPIC
BILIRUBIN URINE: NEGATIVE
GLUCOSE, UA: NEGATIVE mg/dL
KETONES UR: NEGATIVE mg/dL
Nitrite: NEGATIVE
PH: 6.5 (ref 5.0–8.0)
Protein, ur: NEGATIVE mg/dL
SPECIFIC GRAVITY, URINE: 1.02 (ref 1.005–1.030)

## 2016-03-05 LAB — COMPREHENSIVE METABOLIC PANEL
ALT: 19 U/L (ref 14–54)
AST: 27 U/L (ref 15–41)
Albumin: 3.5 g/dL (ref 3.5–5.0)
Alkaline Phosphatase: 82 U/L (ref 38–126)
Anion gap: 6 (ref 5–15)
BUN: 31 mg/dL — ABNORMAL HIGH (ref 6–20)
CALCIUM: 8.7 mg/dL — AB (ref 8.9–10.3)
CHLORIDE: 105 mmol/L (ref 101–111)
CO2: 29 mmol/L (ref 22–32)
CREATININE: 0.95 mg/dL (ref 0.44–1.00)
GFR, EST NON AFRICAN AMERICAN: 53 mL/min — AB (ref >60)
Glucose, Bld: 99 mg/dL (ref 65–99)
Potassium: 4.8 mmol/L (ref 3.5–5.1)
Sodium: 140 mmol/L (ref 135–145)
TOTAL PROTEIN: 6.4 g/dL — AB (ref 6.5–8.1)
Total Bilirubin: 0.5 mg/dL (ref 0.3–1.2)

## 2016-03-05 LAB — URINE MICROSCOPIC-ADD ON

## 2016-03-05 LAB — PHENYTOIN LEVEL, TOTAL: Phenytoin Lvl: 13.3 ug/mL (ref 10.0–20.0)

## 2016-03-05 LAB — I-STAT TROPONIN, ED: TROPONIN I, POC: 0 ng/mL (ref 0.00–0.08)

## 2016-03-05 NOTE — ED Provider Notes (Signed)
[facility] DEPT Provider Note   CSN: [id number] Arrival date & time: 03/05/16  1641     History   Chief Complaint Chief Complaint  Patient presents with  . Fall    HPI Rebecca [name] is a 82 y.o. female.  The history is provided by the patient and medical records.    Level V caveat: Dementia 82 year old female with history of anemia, dementia, COPD and PE on Eliquis, chronic back pain, remote history of seizures, hypothyroidism, degenerative disc disease, vitamin A and D deficiency, presenting to the ED after an unwitnessed fall at her nursing facility.  Patient states she remembers sitting on her bed sorting her earrings and remembers leaning forward but cannot remember after that.  She apparently struck the back of her head on a bedside table.  No LOC.  States she crawled into the hallway to ask for help.  Patient states initially she had a headache, resolved now.  No dizziness or feelings of syncope today.  States overall she feels ok.  She is on Eliquis, has been taking as directed.  Tetanus last given 2014.  Daughter here in ED with patient, states she appears at her baseline.  Past Medical History:  Diagnosis Date  . Anemia, unspecified 03/18/2011  . Anxiety state, unspecified 01/2011  . Blood in stool 06/15/2012  . Corns and callosities 07/01/2011  . Diverticulosis 02/2011  . Dizziness   . DJD (degenerative joint disease)   . Dysuria 06/17/2011  . Hemorrhoids 02/2011  . Low back pain   . Mitral valve problem thickened   thickened  . Osteoarthritis of both hands 10/09/2015  . Osteoporosis 02/2011  . Other abnormal blood chemistry 0/30/2012  . Other acquired deformity of toe 12/16/2011  . Pain in joint, site unspecified 01/2012  . 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 Sacroiliitis, not elsewhere classified ([place]) 05/2011  . Seizures ([place])    Remotely  . Unspecified hypothyroidism 01/2011  . 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 Vitamin D deficiency 01/2011    Patient Active Problem List   Diagnosis Date Noted  . Acute upper respiratory infection 02/05/2016  . Rash and nonspecific skin eruption 01/29/2016  . Osteoarthritis of both hands 10/09/2015  . Back pain 06/01/2015  . Weak 04/16/2015  . Unstable gait 04/16/2015  . Protein-calorie malnutrition, severe 04/02/2015  . Elevated troponin 03/28/2015  . Abnormal finding on EKG-new inferior/ septal TWI 03/28/2015  . DNR no code (do not resuscitate) 03/28/2015  . Hypoxemic respiratory failure, chronic ([place]) 03/28/2015  . DVT, femoral, acute ([place]) 03/28/2015  . Chronic pulmonary embolism ([place]) 03/28/2015  . Headache 03/23/2015  . Dyslipidemia 03/12/2015  . Seizure disorder ([place]) 03/11/2015  . Depression 09/26/2014  . Memory impairment- SNF/ assited living pt 09/19/2012  . Hypothyroidism 12/08/2006    Past Surgical History:  Procedure Laterality Date  . bletheroplasty    . CATARACT EXTRACTION W/ INTRAOCULAR LENS  IMPLANT, BILATERAL  2010  . CERVICAL LAMINECTOMY  2005  . HAMMER TOE SURGERY  2013   right 2nd toe Dr. Mallie Mussel  . JOINT REPLACEMENT Bilateral 2002 Right, 1992 Left   knees    OB History    No data available       Home Medications    Prior to Admission medications   Medication Sig Start Date End Date Taking? Authorizing Provider  acetaminophen (TYLENOL) 325 MG tablet Take 325 mg by mouth. Take 2 tablets twice daily  Historical Provider, MD  amoxicillin (AMOXIL) 500 MG capsule 500 mg. Take 4 capsule by mouth 1 hour before procedure as needed.    Historical Provider, MD  apixaban Arne [place]) 5 MG TABS tablet One twice daily for anticoagulation5 07/10/15   Estill Dooms, MD  Calcium Carbonate-Vitamin D (CALCIUM 600+D) 600-400 MG-UNIT tablet Take 1 tablet by mouth daily.    Historical Provider, MD  Cholecalciferol (VITAMIN D-3) 5000 UNITS TABS Take 5,000 Units by mouth daily.     Historical Provider, MD  citalopram (CELEXA) 10  MG tablet One daily to treat depression and anxiety 10/09/15   Estill Dooms, MD  clonazePAM (KLONOPIN) 0.5 MG tablet Take 1 tablet (0.5 mg total) by mouth 2 (two) times daily as needed (anxiety). 03/17/15   Robbie Lis, MD  donepezil (ARICEPT) 10 MG tablet Take 10 mg by mouth daily. Reported on 07/10/2015    Historical Provider, MD  fluticasone furoate-vilanterol (BREO ELLIPTA) 100-25 MCG/INH AEPB Inhale 1 puff into the lungs daily.    Historical Provider, MD  levothyroxine (SYNTHROID, LEVOTHROID) 100 MCG tablet Take 1 tablet (100 mcg total) by mouth daily before breakfast. For thyroid 09/28/14   Estill Dooms, MD  meloxicam (MOBIC) 7.5 MG tablet Take 7.5 mg by mouth daily.    Historical Provider, MD  Multiple Vitamin (TAB-A-VITE PO) Take 1 tablet by mouth daily.    Historical Provider, MD  Omega-3 1000 MG CAPS Take by mouth. Take one capsule every morning    Historical Provider, MD  phenytoin (DILANTIN) 100 MG ER capsule Take 100 mg by mouth. Take one capsule every morning; take 2 capsules at bedtime    Historical Provider, MD  trolamine salicylate (ASPERCREME) 10 % cream Apply 1 application topically 4 (four) times daily.    Historical Provider, MD    Family History Family History  Problem Relation Age of Onset  . Alzheimer's disease Sister   . Emphysema Sister     Social History Social History  Substance Use Topics  . Smoking status: Former Smoker    Quit date: 09/14/1988  . Smokeless tobacco: Never Used     Comment: Smoked <1ppd  . Alcohol use 0.6 oz/week    1 Glasses of wine per week     Comment: doesn't drink routinely & only on social occasions     Allergies   Macrodantin [nitrofurantoin]; Morphine and related; and Sulfa antibiotics   Review of Systems Review of Systems  Unable to perform ROS: Dementia     Physical Exam Updated Vital Signs BP (!) 118/49 (BP Location: Right Arm)   Pulse 65   Temp 97.8 F (36.6 C) (Oral)   Resp 16   SpO2 95%   Physical Exam    Constitutional: She appears well-developed and well-nourished.  HENT:  Head: Normocephalic and atraumatic.  Mouth/Throat: Oropharynx is clear and moist.  Abrasion noted to posterior scalp; dried blood, no active bleeding  Eyes: Conjunctivae and EOM are normal. Pupils are equal, round, and reactive to light.  Neck: Normal range of motion.  Cardiovascular: Normal rate, regular rhythm and normal heart sounds.   Pulmonary/Chest: Effort normal and breath sounds normal.  Abdominal: Soft. Bowel sounds are normal.  Musculoskeletal: Normal range of motion.  Pelvis stable, non-tender; full ROM of both hips, no leg shortening  Neurological: She is alert.  Awake, alert, conversation is fluid, some confusion regarding time/date, moving extremities well without noted ataxia, at baseline per daughter at bedside  Skin: Skin is warm and dry.  Psychiatric: She has a normal mood and affect.  Nursing note and vitals reviewed.    ED Treatments / Results  Labs (all labs ordered are listed, but only abnormal results are displayed) Labs Reviewed  COMPREHENSIVE METABOLIC PANEL - Abnormal; Notable for the following:       Result Value   BUN 31 (*)    Calcium 8.7 (*)    Total Protein 6.4 (*)    GFR calc non Af Amer 53 (*)    All other components within normal limits  URINALYSIS, ROUTINE W REFLEX MICROSCOPIC (NOT AT [facility]) - Abnormal; Notable for the following:    APPearance HAZY (*)    Hgb urine dipstick SMALL (*)    Leukocytes, UA SMALL (*)    All other components within normal limits  URINE MICROSCOPIC-ADD ON - Abnormal; Notable for the following:    Squamous Epithelial / LPF 0-5 (*)    Bacteria, UA RARE (*)    Casts HYALINE CASTS (*)    All other components within normal limits  CBC WITH DIFFERENTIAL/PLATELET  PHENYTOIN LEVEL, TOTAL  I-STAT TROPOININ, ED    EKG  EKG Interpretation None       Radiology Ct Head Wo Contrast  Result Date: 03/05/2016 CLINICAL DATA:  Fall, on Eliquis  EXAM: CT HEAD WITHOUT CONTRAST CT CERVICAL SPINE WITHOUT CONTRAST TECHNIQUE: Multidetector CT imaging of the head and cervical spine was performed following the standard protocol without intravenous contrast. Multiplanar CT image reconstructions of the cervical spine were also generated. COMPARISON:  CT cervical spine 04/15/2013, CT head 08/12/2012 FINDINGS: CT HEAD FINDINGS Brain: Generalized atrophy. Normal ventricular morphology. No midline shift or mass effect. Small vessel chronic ischemic changes of deep cerebral white matter. 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 No intracranial hemorrhage, mass lesion or evidence acute infarction. No extra-axial fluid collections. Vascular: Atherosclerotic calcifications at the carotid siphons Skull: Intact Sinuses/Orbits: Small air-fluid level LEFT sphenoid sinus. Opacification and chronic osseous thickening of the RIGHT sphenoid sinus. Nasal septal deviation to the LEFT. Remaining visualized sinuses and mastoid air cells clear. Other: N/A CT CERVICAL SPINE FINDINGS Alignment: Question fusion of C4-C5. Minimal retrolisthesis C5-C6 and minimal anterolisthesis C3-C4 and C7-T1, likely degenerative. Skull base and vertebrae: Diffuse osseous demineralization. Multilevel facet degenerative changes. Facet ankylosis LEFT C7-T1. Vertebral body heights maintained without fracture or bone destruction. Skullbase intact. Soft tissues and spinal canal: Prevertebral soft tissues normal thickness. Scattered atherosclerotic calcifications in the carotid arteries and at aortic arch. Disc levels: Multilevel disc space narrowing and endplate spur formation. No gross intraspinal mass. Upper chest: Mild RIGHT apical lung scarring. Other: N/A IMPRESSION: Atrophy with small vessel chronic ischemic changes of deep cerebral white matter and old appearing LEFT occipital infarct. No acute intracranial abnormalities. Chronic sphenoid sinus disease. Multilevel degenerative  disc and facet disease changes cervical spine as above. No acute cervical spine abnormalities. Electronically Signed   By: Lavonia Dana M.D.   On: 03/05/2016 18:01   Ct Cervical Spine Wo Contrast  Result Date: 03/05/2016 CLINICAL DATA:  Fall, on Eliquis EXAM: CT HEAD WITHOUT CONTRAST CT CERVICAL SPINE WITHOUT CONTRAST TECHNIQUE: Multidetector CT imaging of the head and cervical spine was performed following the standard protocol without intravenous contrast. Multiplanar CT image reconstructions of the cervical spine were also generated. COMPARISON:  CT cervical spine 04/15/2013, CT head 08/12/2012 FINDINGS: CT HEAD FINDINGS Brain: Generalized atrophy. Normal ventricular morphology. No midline shift or mass effect. Small vessel chronic ischemic changes of deep cerebral white matter.  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 No intracranial hemorrhage, mass lesion or evidence acute infarction. No extra-axial fluid collections. Vascular: Atherosclerotic calcifications at the carotid siphons Skull: Intact Sinuses/Orbits: Small air-fluid level LEFT sphenoid sinus. Opacification and chronic osseous thickening of the RIGHT sphenoid sinus. Nasal septal deviation to the LEFT. Remaining visualized sinuses and mastoid air cells clear. Other: N/A CT CERVICAL SPINE FINDINGS Alignment: Question fusion of C4-C5. Minimal retrolisthesis C5-C6 and minimal anterolisthesis C3-C4 and C7-T1, likely degenerative. Skull base and vertebrae: Diffuse osseous demineralization. Multilevel facet degenerative changes. Facet ankylosis LEFT C7-T1. Vertebral body heights maintained without fracture or bone destruction. Skullbase intact. Soft tissues and spinal canal: Prevertebral soft tissues normal thickness. Scattered atherosclerotic calcifications in the carotid arteries and at aortic arch. Disc levels: Multilevel disc space narrowing and endplate spur formation. No gross intraspinal mass. Upper chest: Mild RIGHT  apical lung scarring. Other: N/A IMPRESSION: Atrophy with small vessel chronic ischemic changes of deep cerebral white matter and old appearing LEFT occipital infarct. No acute intracranial abnormalities. Chronic sphenoid sinus disease. Multilevel degenerative disc and facet disease changes cervical spine as above. No acute cervical spine abnormalities. Electronically Signed   By: Lavonia Dana M.D.   On: 03/05/2016 18:01    Procedures Procedures (including critical care time)  Medications Ordered in ED Medications - No data to display   Initial Impression / Assessment and Plan / ED Course  I have reviewed the triage vital signs and the nursing notes.  Pertinent labs & imaging results that were available during my care of the patient were reviewed by me and considered in my medical decision making (see chart for details).  Clinical Course   82 year old female here after an unwitnessed fall. Level V caveat applies due to her dementia. Patient does have an abrasion to the back of her head, no active bleeding. No skull depression or hematoma. She is on Eliquis.  CT head/cervical spine and labs pending.  Tetanus is UTD.  At baseline per family.  Imaging and labs reassuring.  Patient has remained at her baseline.  She is ambulatory here without assistance.  Scalp abrasion does not require repair.  Appears stable for discharge back to her facility.  Discussed results with patient and family, they acknowledged understanding.  Will follow-up with PCP.  Return precautions given or any new/worsening symptoms.  Case discussed with attending physician, Dr. Ellender Hose, who evaluated patient and agrees with assessment and plan of care.  Final Clinical Impressions(s) / ED Diagnoses   Final diagnoses:  Fall, initial encounter  Abrasion of scalp, initial encounter    New Prescriptions Discharge Medication List as of 03/05/2016  7:44 PM       Larene Pickett, PA-C 03/05/16 2007    Duffy Bruce,  MD 03/07/16 [phone]

## 2016-03-05 NOTE — ED Triage Notes (Signed)
Per [facility], [facility] from [facility]. Pt hit back of head on bedside table, pt in nad. Laceration in back of head. No LOC. Pt was found on the ground. Does not remember how she fell. Pt on blood thinners. Denies neck pain. Pt is AAOx4.

## 2016-03-05 NOTE — ED Notes (Signed)
Patient ambulated with stand-by assist to restroom and back. Gait somewhat unsteady, pt denies any dizziness/lightheadedness.

## 2016-03-05 NOTE — Discharge Instructions (Signed)
Your labs and head CT today were normal. Follow-up with your primary care doctor. Return here for new concerns.

## 2016-03-06 ENCOUNTER — Non-Acute Institutional Stay: Payer: Medicare Other | Admitting: Internal Medicine

## 2016-03-06 ENCOUNTER — Encounter: Payer: Self-pay | Admitting: Internal Medicine

## 2016-03-06 DIAGNOSIS — G40909 Epilepsy, unspecified, not intractable, without status epilepticus: Secondary | ICD-10-CM

## 2016-03-06 DIAGNOSIS — S0003XA Contusion of scalp, initial encounter: Secondary | ICD-10-CM | POA: Diagnosis not present

## 2016-03-06 DIAGNOSIS — R413 Other amnesia: Secondary | ICD-10-CM | POA: Diagnosis not present

## 2016-03-06 DIAGNOSIS — R21 Rash and other nonspecific skin eruption: Secondary | ICD-10-CM

## 2016-03-06 DIAGNOSIS — R2681 Unsteadiness on feet: Secondary | ICD-10-CM | POA: Diagnosis not present

## 2016-03-06 NOTE — Progress Notes (Signed)
Progress Note    Location:  [facility] Room Number: [place] of Service:  ALF [phone]) Provider:  Jeanmarie Hubert, MD  Patient Care Team: Estill Dooms, MD as PCP - General (Internal Medicine) [facility] [name], MD as Consulting Physician (Orthopedic Surgery) [name], MD as Consulting Physician (Physical Medicine and Rehabilitation) Melina Schools, MD as Consulting Physician (Orthopedic Surgery) [name], MD as Consulting Physician (Otolaryngology) Man Otho Darner, NP as Nurse Practitioner (Internal Medicine)  Extended Emergency Contact Information Primary Emergency Contact: Wissink,[name] Address: [address], [place] Phone: [phone] Mobile Phone: [phone] Relation: Son Secondary Emergency Contact: [name] [place] Phone: [phone] Mobile Phone: [phone] Relation: Daughter  Code Status:  DNR Goals of care: Advanced Directive information Advanced Directives 03/06/2016  Does patient have an advance directive? Yes  Type of Paramedic of [place];Living will;Out of facility DNR (pink MOST or yellow form)  Does patient want to make changes to advanced directive? -  Copy of advanced directive(s) in chart? Yes  Pre-existing out of facility DNR order (yellow form or pink MOST form) Yellow form placed in chart (order not valid for inpatient use)     Chief Complaint  Patient presents with  . Acute Visit    went to ED 03/05/16 after fall, abrasion of scalp  . Rash    on neck    HPI:  Pt is a 82 y.o. [name] seen today for an acute visit for follow up of a fall and subsequent ER visit as well as a neew complaint of a rash on her neck for a couple of weeks.  ED visit 03/05/16 following a fall at [facility]. She struck the back of her head on a bedside table. Had ans abrasion of the posterior scalp. CT of the head and lab tests were  basically fine. She has cerebral atrophy, prior left occipital infarct, and SVD.  She says her head is feeling OK today.  She complains of a rash on her neck that causes red spots and itches.   Past Medical History:  Diagnosis Date  . Anemia, unspecified 03/18/2011  . Anxiety state, unspecified 01/2011  . Blood in stool 06/15/2012  . Corns and callosities 07/01/2011  . Diverticulosis 02/2011  . Dizziness   . DJD (degenerative joint disease)   . Dysuria 06/17/2011  . Hemorrhoids 02/2011  . Low back pain   . Mitral valve problem thickened   thickened  . Osteoarthritis of both hands 10/09/2015  . Osteoporosis 02/2011  . Other abnormal blood chemistry 0/30/2012  . Other acquired deformity of toe 12/16/2011  . Pain in joint, site unspecified 01/2012  . 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 Sacroiliitis, not elsewhere classified ([place]) 05/2011  . Seizures ([place])    Remotely  . Unspecified hypothyroidism 01/2011  . 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 Vitamin D deficiency 01/2011   Past Surgical History:  Procedure Laterality Date  . bletheroplasty    . CATARACT EXTRACTION W/ INTRAOCULAR LENS  IMPLANT, BILATERAL  2010  . CERVICAL LAMINECTOMY  2005  . HAMMER TOE SURGERY  2013   right 2nd toe Dr. Mallie Mussel  . JOINT REPLACEMENT Bilateral 2002 Right, 1992 Left   knees    Allergies  Allergen Reactions  . Macrodantin [Nitrofurantoin]   . Morphine And Related Other (  See Comments)    Altered mental state   . Sulfa Antibiotics Other (See Comments)    Reaction: unknown      Medication List       Accurate as of 03/06/16 10:43 AM. Always use your most recent med list.          acetaminophen 325 MG tablet Commonly known as:  TYLENOL Take 325 mg by mouth. Take 2 tablets twice daily   apixaban 5 MG Tabs tablet Commonly known as:  ELIQUIS One twice daily for anticoagulation5   BREO ELLIPTA 100-25 MCG/INH Aepb Generic drug:   fluticasone furoate-vilanterol Inhale 1 puff into the lungs daily.   CALCIUM 600+D 600-400 MG-UNIT tablet Generic drug:  Calcium Carbonate-Vitamin D Take 1 tablet by mouth daily.   citalopram 10 MG tablet Commonly known as:  CELEXA One daily to treat depression and anxiety   clonazePAM 0.5 MG tablet Commonly known as:  KLONOPIN Take 1 tablet (0.5 mg total) by mouth 2 (two) times daily as needed (anxiety).   donepezil 10 MG tablet Commonly known as:  ARICEPT Take 10 mg by mouth daily. Reported on 07/10/2015   levothyroxine 100 MCG tablet Commonly known as:  SYNTHROID, LEVOTHROID Take 1 tablet (100 mcg total) by mouth daily before breakfast. For thyroid   meloxicam 7.5 MG tablet Commonly known as:  MOBIC Take 7.5 mg by mouth daily.   memantine 10 MG tablet Commonly known as:  NAMENDA Take 10 mg by mouth 2 (two) times daily.   nystatin-triamcinolone cream Commonly known as:  MYCOLOG II Apply to affected areas twice daily as needed   Omega-3 1000 MG Caps Take 1 capsule by mouth daily. Take one capsule every morning   phenytoin 100 MG ER capsule Commonly known as:  DILANTIN Take 100 mg by mouth. Take one capsule every morning; take 2 capsules at bedtime   TAB-A-VITE PO Take 1 tablet by mouth daily.   Vitamin D3 5000 units Tabs Take by mouth. Take one tablet daily       Review of Systems  Constitutional: Negative for diaphoresis.  HENT: Positive for hearing loss. Negative for congestion, ear discharge, ear pain, nosebleeds, sore throat and tinnitus.        Bilateral hearing loss. June 2015 ruptured right Tm with drainage, but little pain.  Eyes: Negative.   Respiratory: Positive for shortness of breath.        History of dyspnea on exertion. Remains on oxygen 24/7 at this time. History of pulmonary embolism and [place] filter. Remains on warfarin.  Cardiovascular: Negative.   Gastrointestinal: Negative.   Endocrine: Negative for polydipsia.  Genitourinary:  Positive for frequency.       2-3x/night  Musculoskeletal: Positive for arthralgias (both hands), back pain and gait problem (unstable using walker).       Osteoarthritis with complaints of pain in multiple areas. Neck and right shoulder pains.  [facility] 05/20/15 C/o back pain  Skin: Positive for rash (on neck).       Senile ecchymoses  Allergic/Immunologic: Negative for environmental allergies.  Neurological: Positive for weakness. Negative for dizziness, tremors and seizures.       Hx seizures.  Head trauma sustained in a fall on 10/05/27/15.  Psychiatric/Behavioral: Positive for confusion, decreased concentration and dysphoric mood. Negative for suicidal ideas. The patient is nervous/anxious.        Chronic anxiety. Memory deficits.    Immunization History  Administered Date(s) Administered  . Influenza Whole 03/17/2007, 02/16/2009, 01/30/2010, 02/16/2013  . Influenza-Unspecified 03/02/2014,  02/22/2015, 02/28/2016  . PPD Test 07/09/2010  . Pneumococcal Polysaccharide-23 05/19/2005, 05/23/2013  . Td 05/19/2005  . Tetanus 10/06/2012   Pertinent  Health Maintenance Due  Topic Date Due  . DEXA SCAN  11/19/1994  . PNA vac Low Risk Adult (2 of 2 - PCV13) 05/23/2014  . INFLUENZA VACCINE  12/18/2015   Fall Risk  10/09/2015 07/10/2015 03/26/2015 12/19/2014 03/22/2013  Falls in the past year? No Yes Yes Yes Yes  Number falls in past yr: - '1 1 1 2 '$ or more  Injury with Fall? - Yes No Yes -   Functional Status Survey:    Vitals:   03/06/16 1019  BP: 138/62  Pulse: 72  Resp: 18  Temp: 98.6 F (37 C)  Weight: 137 lb (62.1 kg)  Height: 5' (1.524 m)   Body mass index is 26.76 kg/m. Physical Exam  Constitutional: She is oriented to person, place, and time. She appears well-developed and well-nourished. No distress.  HENT:  Bilateral hearing loss. Rupture of the right TM.   Eyes: Conjunctivae and EOM are normal. Pupils are equal, round, and reactive to light.  Lens implants in both eyes    Neck: Normal range of motion. Neck supple. No JVD present. No tracheal deviation present. No thyromegaly present.  Cardiovascular: Normal rate, regular rhythm, normal heart sounds and intact distal pulses.  Exam reveals no gallop and no friction rub.   No murmur heard. Pulmonary/Chest: Effort normal and breath sounds normal. No respiratory distress. She has no wheezes. She exhibits no tenderness.  Abdominal: Soft. Bowel sounds are normal. She exhibits no distension and no mass. There is no tenderness.  Genitourinary: Rectal exam shows guaiac negative stool.  Genitourinary Comments: From previous exam: Hemorrhoids. Normal sphincter tone. Stools are normal brown color.  Musculoskeletal: Normal range of motion. She exhibits tenderness and deformity. She exhibits no edema.  Tender in the posterior neck. Mild restriction in movements. Hammertoes at the left second and third. Right second toe overlaps the great toe. SP surgical repair of the right 2nd toe.  Shoulders sore. Painful in the right shoulder when she lifts overhead and rotates at the [place]poulder.[facility] July 2014. Pain in both hands and significant deformities related to OA.  Unstable gait. Using walker with 2 front wheels and rear skids. Weaker grip of the right hand.. Tender in the left SI joint. Tender in all enlarged finger joints.   Lymphadenopathy:    She has no cervical adenopathy.  Neurological: She is alert and oriented to person, place, and time. No cranial nerve deficit. Coordination normal.  No loss of grip strength. Loss of memory. 04/04/14 MMSE 25/30. Passed clock drawing. 07/04/14 MMSE 28/30. Failed clock drawing. Bilateral tremor.  Skin: Skin is warm and dry. Rash (erythematous blotches on the neck anteriorly) noted. No erythema. No pallor.  Contusion of the posterior scalp  Psychiatric: Thought content normal.  depressed    Labs reviewed:  Recent Labs  03/29/15 0232  04/02/15 0553 04/03/15 0655 04/05/15 09/27/15  12/13/15 03/05/16 1730  NA 137  < > 139 141 142 141 142 140  K 4.3  < > 4.1 3.9 4.8 4.2 4.4 4.8  CL 104  < > 103 103  --   --   --  105  CO2 24  < > 26 31  --   --   --  29  GLUCOSE 159*  < > 105* 103*  --   --   --  99  BUN 15  < >  $'11 10 18 'A$ 29* 20 31*  CREATININE 0.64  < > 0.64 0.58 0.5 0.6  --  0.95  CALCIUM 8.3*  < > 8.8* 8.9  --   --   --  8.7*  MG 2.0  --  1.9  --   --   --   --   --   PHOS 4.2  --   --   --   --   --   --   --   < > = values in this interval not displayed.  Recent Labs  03/11/15 1838  03/29/15 1045 09/27/15 03/05/16 1730  AST 22  < > 33 18 27  ALT 15  < > '25 13 19  '$ ALKPHOS 62  < > 75 84 82  BILITOT 0.6  --  0.1*  --  0.5  PROT 6.4*  --  6.2*  --  6.4*  ALBUMIN 3.3*  --  2.3*  --  3.5  < > = values in this interval not displayed.  Recent Labs  03/11/15 1838  03/28/15 1138  04/02/15 0553 04/04/15 0519 12/13/15 03/05/16 1730  WBC 13.0*  < > 12.3*  < > 7.6 8.6 6.1 5.9  NEUTROABS 11.8*  --  8.3*  --   --   --   --  3.9  HGB 12.5  < > 12.7  < > 10.8* 11.6* 14.1 12.6  HCT 38.3  < > 38.8  < > 33.7* 35.9*  --  39.4  MCV 98.2  < > 98.7  < > 99.7 99.4  --  99.5  PLT 157  < > 255  < > 233 255 190 173  < > = values in this interval not displayed. Lab Results  Component Value Date   TSH 1.96 09/27/2015   Lab Results  Component Value Date   HGBA1C 5.4 03/08/2015   Lab Results  Component Value Date   CHOL 235 (H) 03/06/2009   HDL 108.40 03/06/2009   LDLDIRECT 108.2 03/06/2009    Significant Diagnostic Results in last 30 days:  Ct Head Wo Contrast  Result Date: 03/05/2016 CLINICAL DATA:  Fall, on Eliquis EXAM: CT HEAD WITHOUT CONTRAST CT CERVICAL SPINE WITHOUT CONTRAST TECHNIQUE: Multidetector CT imaging of the head and cervical spine was performed following the standard protocol without intravenous contrast. Multiplanar CT image reconstructions of the cervical spine were also generated. COMPARISON:  CT cervical spine 04/15/2013, CT head 08/12/2012  FINDINGS: CT HEAD FINDINGS Brain: Generalized atrophy. Normal ventricular morphology. No midline shift or mass effect. Small vessel chronic ischemic changes of deep cerebral white matter. 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 No intracranial hemorrhage, mass lesion or evidence acute infarction. No extra-axial fluid collections. Vascular: Atherosclerotic calcifications at the carotid siphons Skull: Intact Sinuses/Orbits: Small air-fluid level LEFT sphenoid sinus. Opacification and chronic osseous thickening of the RIGHT sphenoid sinus. Nasal septal deviation to the LEFT. Remaining visualized sinuses and mastoid air cells clear. Other: N/A CT CERVICAL SPINE FINDINGS Alignment: Question fusion of C4-C5. Minimal retrolisthesis C5-C6 and minimal anterolisthesis C3-C4 and C7-T1, likely degenerative. Skull base and vertebrae: Diffuse osseous demineralization. Multilevel facet degenerative changes. Facet ankylosis LEFT C7-T1. Vertebral body heights maintained without fracture or bone destruction. Skullbase intact. Soft tissues and spinal canal: Prevertebral soft tissues normal thickness. Scattered atherosclerotic calcifications in the carotid arteries and at aortic arch. Disc levels: Multilevel disc space narrowing and endplate spur formation. No gross intraspinal mass. Upper chest: Mild RIGHT apical lung scarring. Other:  N/A IMPRESSION: Atrophy with small vessel chronic ischemic changes of deep cerebral white matter and old appearing LEFT occipital infarct. No acute intracranial abnormalities. Chronic sphenoid sinus disease. Multilevel degenerative disc and facet disease changes cervical spine as above. No acute cervical spine abnormalities. Electronically Signed   By: Lavonia Dana M.D.   On: 03/05/2016 18:01   Ct Cervical Spine Wo Contrast  Result Date: 03/05/2016 CLINICAL DATA:  Fall, on Eliquis EXAM: CT HEAD WITHOUT CONTRAST CT CERVICAL SPINE WITHOUT CONTRAST TECHNIQUE: Multidetector  CT imaging of the head and cervical spine was performed following the standard protocol without intravenous contrast. Multiplanar CT image reconstructions of the cervical spine were also generated. COMPARISON:  CT cervical spine 04/15/2013, CT head 08/12/2012 FINDINGS: CT HEAD FINDINGS Brain: Generalized atrophy. Normal ventricular morphology. No midline shift or mass effect. Small vessel chronic ischemic changes of deep cerebral white matter. 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 No intracranial hemorrhage, mass lesion or evidence acute infarction. No extra-axial fluid collections. Vascular: Atherosclerotic calcifications at the carotid siphons Skull: Intact Sinuses/Orbits: Small air-fluid level LEFT sphenoid sinus. Opacification and chronic osseous thickening of the RIGHT sphenoid sinus. Nasal septal deviation to the LEFT. Remaining visualized sinuses and mastoid air cells clear. Other: N/A CT CERVICAL SPINE FINDINGS Alignment: Question fusion of C4-C5. Minimal retrolisthesis C5-C6 and minimal anterolisthesis C3-C4 and C7-T1, likely degenerative. Skull base and vertebrae: Diffuse osseous demineralization. Multilevel facet degenerative changes. Facet ankylosis LEFT C7-T1. Vertebral body heights maintained without fracture or bone destruction. Skullbase intact. Soft tissues and spinal canal: Prevertebral soft tissues normal thickness. Scattered atherosclerotic calcifications in the carotid arteries and at aortic arch. Disc levels: Multilevel disc space narrowing and endplate spur formation. No gross intraspinal mass. Upper chest: Mild RIGHT apical lung scarring. Other: N/A  IMPRESSION: Atrophy with small vessel chronic ischemic changes of deep cerebral white matter and old appearing LEFT occipital infarct. No acute intracranial abnormalities. Chronic sphenoid sinus disease. Multilevel degenerative disc and facet disease changes cervical spine as above. No acute cervical spine  abnormalities. Electronically Signed   By: Lavonia Dana M.D.   On: 03/05/2016 18:01    Assessment/Plan 1. Contusion of scalp, initial encounter Clean contusion. Should resolve fine with time.  2. Rash and nonspecific skin eruption -TCA 0.1% cream daily too rash  3. Unstable gait Continue to encourage use of walker  4. Seizure disorder ([place]) None recently  5. Memory impairment- SNF/ assited living pt Likely a combination of vascular dementia and probable Alzheimer's. Continue donepezil and memantine.

## 2016-03-10 DIAGNOSIS — M25551 Pain in right hip: Secondary | ICD-10-CM | POA: Diagnosis not present

## 2016-03-10 DIAGNOSIS — M545 Low back pain: Secondary | ICD-10-CM | POA: Diagnosis not present

## 2016-03-10 DIAGNOSIS — M533 Sacrococcygeal disorders, not elsewhere classified: Secondary | ICD-10-CM | POA: Diagnosis not present

## 2016-03-10 DIAGNOSIS — S0003XA Contusion of scalp, initial encounter: Secondary | ICD-10-CM | POA: Insufficient documentation

## 2016-03-11 ENCOUNTER — Non-Acute Institutional Stay: Payer: Medicare Other | Admitting: Nurse Practitioner

## 2016-03-11 ENCOUNTER — Encounter: Payer: Self-pay | Admitting: Nurse Practitioner

## 2016-03-11 DIAGNOSIS — R413 Other amnesia: Secondary | ICD-10-CM

## 2016-03-11 DIAGNOSIS — I2782 Chronic pulmonary embolism: Secondary | ICD-10-CM | POA: Diagnosis not present

## 2016-03-11 DIAGNOSIS — F320 Major depressive disorder, single episode, mild: Secondary | ICD-10-CM

## 2016-03-11 DIAGNOSIS — M25551 Pain in right hip: Secondary | ICD-10-CM | POA: Diagnosis not present

## 2016-03-11 DIAGNOSIS — E039 Hypothyroidism, unspecified: Secondary | ICD-10-CM

## 2016-03-11 DIAGNOSIS — G40909 Epilepsy, unspecified, not intractable, without status epilepticus: Secondary | ICD-10-CM | POA: Diagnosis not present

## 2016-03-11 NOTE — Assessment & Plan Note (Addendum)
02/12/15 X-ray L femur, hip, knee: no acute fracture or dislocation.  he right hip pain, better today, she is up and walking, taking Tylenol and Mobic for pain but not adequate. X-ray 03/10/16 R hip showed no acute fracture.  Adding Norco 5/325 1/2 tab bid, may consider MRI of the right hip and pelvis is pain persists.

## 2016-03-11 NOTE — Assessment & Plan Note (Addendum)
03/20/15 MMSE 27/30, continue Aricept and Namenda

## 2016-03-11 NOTE — Assessment & Plan Note (Signed)
Continue Levothyroxine 17mg, 09/27/15 TSH 1.96

## 2016-03-11 NOTE — Assessment & Plan Note (Signed)
Last seizure 2010 02/01/15 Dilantin lever 12.4(10-20) Continue Dilantin '100mg'$  am, '200mg'$  hs

## 2016-03-11 NOTE — Assessment & Plan Note (Signed)
Continue anticoagulation therapy.

## 2016-03-11 NOTE — Progress Notes (Signed)
Location:  [place] Room Number: 36 Place of Service:  ALF [id number]) Provider: Mafalda Mcginniss, Manxie  NP  Jeanmarie Hubert, MD  Patient Care Team: Estill Dooms, MD as PCP - General (Internal Medicine) [facility] [name], MD as Consulting Physician (Orthopedic Surgery) [name], MD as Consulting Physician (Physical Medicine and Rehabilitation) Melina Schools, MD as Consulting Physician (Orthopedic Surgery) [name], MD as Consulting Physician (Otolaryngology) Starlett Pehrson Otho Darner, NP as Nurse Practitioner (Internal Medicine)  Extended Emergency Contact Information Primary Emergency Contact: Siek,[name] Address: [address], [place] Phone: [phone] Mobile Phone: [phone] Relation: Son Secondary Emergency Contact: [name] [place] Phone: [phone] Mobile Phone: [phone] Relation: Daughter  Code Status: DNR Goals of care: Advanced Directive information Advanced Directives 03/11/2016  Does patient have an advance directive? Yes  Type of Paramedic of [place];Living will;Out of facility DNR (pink MOST or yellow form)  Does patient want to make changes to advanced directive? No - Patient declined  Copy of advanced directive(s) in chart? Yes  Pre-existing out of facility DNR order (yellow form or pink MOST form) -     Chief Complaint  Patient presents with  . Acute Visit    HPI:  Pt is a 82 y.o. female seen today for an acute visit for c/o the right hip pain, better today, she is up and walking, taking Tylenol and Mobic for pain but not adequate. X-ray 03/10/16 R hip showed no acute fracture.   03/05/16 ED eval for fall, CT head negative.   Hx of Seizure, on Dilantin '100mg'$  am '200mg'$  hs, no active seizure activity since last visited, her mood is managed with Celexa '10mg'$ , clonazepam bid,  hypothyroidism, on Levothyroxine 141mg, last TSH 09/27/15 TSH  1.96. Dementia, resides at AL, takign Aricept and Namenda,  forgetful. Hx of PE, on Eliquis. Lower back pain, taking Mobic 7.'5mg'$ , Tylenol '325mg'$  bid Past Medical History:  Diagnosis Date  . Anemia, unspecified 03/18/2011  . Anxiety state, unspecified 01/2011  . Blood in stool 06/15/2012  . Corns and callosities 07/01/2011  . Diverticulosis 02/2011  . Dizziness   . DJD (degenerative joint disease)   . Dysuria 06/17/2011  . Hemorrhoids 02/2011  . Low back pain   . Mitral valve problem thickened   thickened  . Osteoarthritis of both hands 10/09/2015  . Osteoporosis 02/2011  . Other abnormal blood chemistry 0/30/2012  . Other acquired deformity of toe 12/16/2011  . Pain in joint, site unspecified 01/2012  . 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 Sacroiliitis, not elsewhere classified ([place] 05/2011  . Seizures ([place]    Remotely  . Unspecified hypothyroidism 01/2011  . 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 Vitamin D deficiency 01/2011   Past Surgical History:  Procedure Laterality Date  . bletheroplasty    . CATARACT EXTRACTION W/ INTRAOCULAR LENS  IMPLANT, BILATERAL  2010  . CERVICAL LAMINECTOMY  2005  . HAMMER TOE SURGERY  2013   right 2nd toe Dr. HMallie Mussel . JOINT REPLACEMENT Bilateral 2002 Right, 1992 Left   knees    Allergies  Allergen Reactions  . Macrodantin [Nitrofurantoin]   . Morphine And Related Other (See Comments)    Altered mental state   . Sulfa Antibiotics Other (See Comments)    Reaction: unknown      Medication List  Accurate as of 03/11/16  4:08 PM. Always use your most recent med list.          acetaminophen 325 MG tablet Commonly known as:  TYLENOL Take 325 mg by mouth. Take 2 tablets twice daily   BREO ELLIPTA 100-25 MCG/INH Aepb Generic drug:  fluticasone furoate-vilanterol Inhale 1 puff into the lungs daily.   CALCIUM 600+D 600-400 MG-UNIT tablet Generic drug:  Calcium Carbonate-Vitamin  D Take 1 tablet by mouth daily.   citalopram 10 MG tablet Commonly known as:  CELEXA One daily to treat depression and anxiety   clonazePAM 0.5 MG tablet Commonly known as:  KLONOPIN Take 1 tablet (0.5 mg total) by mouth 2 (two) times daily as needed (anxiety).   donepezil 10 MG tablet Commonly known as:  ARICEPT Take 10 mg by mouth daily. Reported on 07/10/2015   ELIQUIS 2.5 MG Tabs tablet Generic drug:  apixaban Take 2.5 mg by mouth 2 (two) times daily.   levothyroxine 100 MCG tablet Commonly known as:  SYNTHROID, LEVOTHROID Take 1 tablet (100 mcg total) by mouth daily before breakfast. For thyroid   meloxicam 7.5 MG tablet Commonly known as:  MOBIC Take 7.5 mg by mouth daily.   memantine 10 MG tablet Commonly known as:  NAMENDA Take 10 mg by mouth 2 (two) times daily.   Omega-3 1000 MG Caps Take 1 capsule by mouth daily. Take one capsule every morning   phenytoin 100 MG ER capsule Commonly known as:  DILANTIN Take 100 mg by mouth. Take one capsule every morning; take 2 capsules at bedtime   TAB-A-VITE PO Take 1 tablet by mouth daily.   Vitamin D3 5000 units Tabs Take by mouth. Take one tablet daily       Review of Systems  Constitutional: Negative for diaphoresis.  HENT: Positive for hearing loss. Negative for congestion, ear discharge, ear pain, nosebleeds, sore throat and tinnitus.        Bilateral hearing loss. June 2015 ruptured right Tm with drainage, but little pain.  Eyes: Negative.   Respiratory: Positive for shortness of breath.        History of dyspnea on exertion. Remains on oxygen 24/7 at this time. History of pulmonary embolism and [place] filter. Remains on warfarin.  Cardiovascular: Negative.   Gastrointestinal: Negative.   Endocrine: Negative for polydipsia.  Genitourinary: Positive for frequency.       2-3x/night  Musculoskeletal: Positive for arthralgias (both hands), back pain and gait problem (unstable using walker).        Osteoarthritis with complaints of pain in multiple areas. Neck and right shoulder pains.  [place] 05/20/15 C/o back pain C/o right hip pain  Skin: Positive for rash (on neck).       Senile ecchymoses  Allergic/Immunologic: Negative for environmental allergies.  Neurological: Positive for weakness. Negative for dizziness, tremors and seizures.       Hx seizures.  Head trauma sustained in a fall on 10/05/27/15.  Psychiatric/Behavioral: Positive for confusion, decreased concentration and dysphoric mood. Negative for suicidal ideas. The patient is nervous/anxious.        Chronic anxiety. Memory deficits.    Immunization History  Administered Date(s) Administered  . Influenza Whole 03/17/2007, 02/16/2009, 01/30/2010, 02/16/2013  . Influenza-Unspecified 03/02/2014, 02/22/2015, 02/28/2016  . PPD Test 07/09/2010  . Pneumococcal Polysaccharide-23 05/19/2005, 05/23/2013  . Td 05/19/2005  . Tetanus 10/06/2012   Pertinent  Health Maintenance Due  Topic Date Due  . DEXA SCAN  11/19/1994  . PNA vac Low Risk  Adult (2 of 2 - PCV13) 05/23/2014  . INFLUENZA VACCINE  Completed   Fall Risk  10/09/2015 07/10/2015 03/26/2015 12/19/2014 03/22/2013  Falls in the past year? No Yes Yes Yes Yes  Number falls in past yr: - '1 1 1 2 '$ or more  Injury with Fall? - Yes No Yes -   Functional Status Survey:    Vitals:   03/11/16 1506  BP: 138/62  Pulse: 72  Resp: 18  Temp: 98.6 F (37 C)  Weight: 138 lb 12.8 oz (63 kg)  Height: 5' (1.524 m)   Body mass index is 27.11 kg/m. Physical Exam  Constitutional: She is oriented to person, place, and time. She appears well-developed and well-nourished. No distress.  HENT:  Bilateral hearing loss. Rupture of the right TM.   Eyes: Conjunctivae and EOM are normal. Pupils are equal, round, and reactive to light.  Lens implants in both eyes  Neck: Normal range of motion. Neck supple. No JVD present. No tracheal deviation present. No thyromegaly present.  Cardiovascular:  Normal rate, regular rhythm, normal heart sounds and intact distal pulses.  Exam reveals no gallop and no friction rub.   No murmur heard. Pulmonary/Chest: Effort normal and breath sounds normal. No respiratory distress. She has no wheezes. She exhibits no tenderness.  Abdominal: Soft. Bowel sounds are normal. She exhibits no distension and no mass. There is no tenderness.  Genitourinary: Rectal exam shows guaiac negative stool.  Genitourinary Comments: From previous exam: Hemorrhoids. Normal sphincter tone. Stools are normal brown color.  Musculoskeletal: Normal range of motion. She exhibits tenderness and deformity. She exhibits no edema.  Tender in the posterior neck. Mild restriction in movements. Hammertoes at the left second and third. Right second toe overlaps the great toe. SP surgical repair of the right 2nd toe.  Shoulders sore. Painful in the right shoulder when she lifts overhead and rotates at the [facility]poulder.[facility]. Pain in both hands and significant deformities related to OA.  Unstable gait. Using walker with 2 front wheels and rear skids. Weaker grip of the right hand.. Tender in the left SI joint. Tender in all enlarged finger joints.  Pain in the right hip, X-ray 03/10/16 no acute fx  Lymphadenopathy:    She has no cervical adenopathy.  Neurological: She is alert and oriented to person, place, and time. No cranial nerve deficit. Coordination normal.  No loss of grip strength. Loss of memory. 04/04/14 MMSE 25/30. Passed clock drawing. 07/04/14 MMSE 28/30. Failed clock drawing. Bilateral tremor.  Skin: Skin is warm and dry. Rash (erythematous blotches on the neck anteriorly) noted. No erythema. No pallor.  Contusion of the posterior scalp  Psychiatric: Thought content normal.  depressed    Labs reviewed:  Recent Labs  03/29/15 0232  04/02/15 0553 04/03/15 0655 04/05/15 09/27/15 12/13/15 03/05/16 1730  NA 137  < > 139 141 142 141 142 140  K 4.3  < > 4.1 3.9  4.8 4.2 4.4 4.8  CL 104  < > 103 103  --   --   --  105  CO2 24  < > 26 31  --   --   --  29  GLUCOSE 159*  < > 105* 103*  --   --   --  99  BUN 15  < > '11 10 18 '$ 29* 20 31*  CREATININE 0.64  < > 0.64 0.58 0.5 0.6  --  0.95  CALCIUM 8.3*  < > 8.8* 8.9  --   --   --  8.7*  MG 2.0  --  1.9  --   --   --   --   --   PHOS 4.2  --   --   --   --   --   --   --   < > = values in this interval not displayed.  Recent Labs  03/29/15 1045 09/27/15 03/05/16 1730  AST 33 18 27  ALT '25 13 19  '$ ALKPHOS 75 84 82  BILITOT 0.1*  --  0.5  PROT 6.2*  --  6.4*  ALBUMIN 2.3*  --  3.5    Recent Labs  03/28/15 1138  04/02/15 0553 04/04/15 0519 12/13/15 03/05/16 1730  WBC 12.3*  < > 7.6 8.6 6.1 5.9  NEUTROABS 8.3*  --   --   --   --  3.9  HGB 12.7  < > 10.8* 11.6* 14.1 12.6  HCT 38.8  < > 33.7* 35.9*  --  39.4  MCV 98.7  < > 99.7 99.4  --  99.5  PLT 255  < > 233 255 190 173  < > = values in this interval not displayed. Lab Results  Component Value Date   TSH 1.96 09/27/2015   Lab Results  Component Value Date   HGBA1C 5.4 03/08/2015   Lab Results  Component Value Date   CHOL 235 (H) 03/06/2009   HDL 108.40 03/06/2009   LDLDIRECT 108.2 03/06/2009    Significant Diagnostic Results in last 30 days:  Ct Head Wo Contrast  Result Date: 03/05/2016 CLINICAL DATA:  Fall, on Eliquis EXAM: CT HEAD WITHOUT CONTRAST CT CERVICAL SPINE WITHOUT CONTRAST TECHNIQUE: Multidetector CT imaging of the head and cervical spine was performed following the standard protocol without intravenous contrast. Multiplanar CT image reconstructions of the cervical spine were also generated. COMPARISON:  CT cervical spine 04/15/2013, CT head 08/12/2012 FINDINGS: CT HEAD FINDINGS Brain: Generalized atrophy. Normal ventricular morphology. No midline shift or mass effect. Small vessel chronic ischemic changes of deep cerebral white matter. 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 No intracranial  hemorrhage, mass lesion or evidence acute infarction. No extra-axial fluid collections. Vascular: Atherosclerotic calcifications at the carotid siphons Skull: Intact Sinuses/Orbits: Small air-fluid level LEFT sphenoid sinus. Opacification and chronic osseous thickening of the RIGHT sphenoid sinus. Nasal septal deviation to the LEFT. Remaining visualized sinuses and mastoid air cells clear. Other: N/A CT CERVICAL SPINE FINDINGS Alignment: Question fusion of C4-C5. Minimal retrolisthesis C5-C6 and minimal anterolisthesis C3-C4 and C7-T1, likely degenerative. Skull base and vertebrae: Diffuse osseous demineralization. Multilevel facet degenerative changes. Facet ankylosis LEFT C7-T1. Vertebral body heights maintained without fracture or bone destruction. Skullbase intact. Soft tissues and spinal canal: Prevertebral soft tissues normal thickness. Scattered atherosclerotic calcifications in the carotid arteries and at aortic arch. Disc levels: Multilevel disc space narrowing and endplate spur formation. No gross intraspinal mass. Upper chest: Mild RIGHT apical lung scarring. Other: N/A IMPRESSION: Atrophy with small vessel chronic ischemic changes of deep cerebral white matter and old appearing LEFT occipital infarct. No acute intracranial abnormalities. Chronic sphenoid sinus disease. Multilevel degenerative disc and facet disease changes cervical spine as above. No acute cervical spine abnormalities. Electronically Signed   By: Lavonia Dana M.D.   On: 03/05/2016 18:01   Ct Cervical Spine Wo Contrast  Result Date: 03/05/2016 CLINICAL DATA:  Fall, on Eliquis EXAM: CT HEAD WITHOUT CONTRAST CT CERVICAL SPINE WITHOUT CONTRAST TECHNIQUE: Multidetector CT imaging of the head and cervical spine was performed following the  standard protocol without intravenous contrast. Multiplanar CT image reconstructions of the cervical spine were also generated. COMPARISON:  CT cervical spine 04/15/2013, CT head 08/12/2012 FINDINGS: CT  HEAD FINDINGS Brain: Generalized atrophy. Normal ventricular morphology. No midline shift or mass effect. Small vessel chronic ischemic changes of deep cerebral white matter. 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 No intracranial hemorrhage, mass lesion or evidence acute infarction. No extra-axial fluid collections. Vascular: Atherosclerotic calcifications at the carotid siphons Skull: Intact Sinuses/Orbits: Small air-fluid level LEFT sphenoid sinus. Opacification and chronic osseous thickening of the RIGHT sphenoid sinus. Nasal septal deviation to the LEFT. Remaining visualized sinuses and mastoid air cells clear. Other: N/A CT CERVICAL SPINE FINDINGS Alignment: Question fusion of C4-C5. Minimal retrolisthesis C5-C6 and minimal anterolisthesis C3-C4 and C7-T1, likely degenerative. Skull base and vertebrae: Diffuse osseous demineralization. Multilevel facet degenerative changes. Facet ankylosis LEFT C7-T1. Vertebral body heights maintained without fracture or bone destruction. Skullbase intact. Soft tissues and spinal canal: Prevertebral soft tissues normal thickness. Scattered atherosclerotic calcifications in the carotid arteries and at aortic arch. Disc levels: Multilevel disc space narrowing and endplate spur formation. No gross intraspinal mass. Upper chest: Mild RIGHT apical lung scarring. Other: N/A IMPRESSION: Atrophy with small vessel chronic ischemic changes of deep cerebral white matter and old appearing LEFT occipital infarct. No acute intracranial abnormalities. Chronic sphenoid sinus disease. Multilevel degenerative disc and facet disease changes cervical spine as above. No acute cervical spine abnormalities. Electronically Signed   By: Lavonia Dana M.D.   On: 03/05/2016 18:01    Assessment/Plan Right hip pain 02/12/15 X-ray L femur, hip, knee: no acute fracture or dislocation.  he right hip pain, better today, she is up and walking, taking Tylenol and Mobic for pain but  not adequate. X-ray 03/10/16 R hip showed no acute fracture.  Adding Norco 5/325 1/2 tab bid, may consider MRI of the right hip and pelvis is pain persists.    Chronic pulmonary embolism (HCC) Continue anticoagulation therapy.   Hypothyroidism Continue Levothyroxine 125mg, 09/27/15 TSH 1.96  Seizure disorder ([place] Last seizure 2010 02/01/15 Dilantin lever 12.4(10-20) Continue Dilantin '100mg'$  am, '200mg'$  hs    Memory impairment- SNF/ assited living pt 03/20/15 MMSE 27/30, continue Aricept and Namenda  Depression Sleeps well, mood is stable, continue Celexa '10mg'$  daily, Clonazepam 0.'5mg'$  bid prn,  observe.        Family/ staff Communication: continue [facility] for care assistance.   Labs/tests ordered:  none

## 2016-03-11 NOTE — Assessment & Plan Note (Signed)
Sleeps well, mood is stable, continue Celexa '10mg'$  daily, Clonazepam 0.'5mg'$  bid prn,  observe.

## 2016-03-30 ENCOUNTER — Emergency Department (HOSPITAL_COMMUNITY)
Admission: EM | Admit: 2016-03-30 | Discharge: 2016-03-30 | Disposition: A | Discharge status: Home / Self Care | Payer: Medicare Other | Attending: Emergency Medicine | Admitting: Emergency Medicine

## 2016-03-30 ENCOUNTER — Emergency Department (HOSPITAL_COMMUNITY): Payer: Medicare Other

## 2016-03-30 ENCOUNTER — Encounter (HOSPITAL_COMMUNITY): Payer: Self-pay | Admitting: *Deleted

## 2016-03-30 DIAGNOSIS — Z96653 Presence of artificial knee joint, bilateral: Secondary | ICD-10-CM | POA: Diagnosis not present

## 2016-03-30 DIAGNOSIS — Z79899 Other long term (current) drug therapy: Secondary | ICD-10-CM | POA: Diagnosis not present

## 2016-03-30 DIAGNOSIS — Z87891 Personal history of nicotine dependence: Secondary | ICD-10-CM | POA: Diagnosis not present

## 2016-03-30 DIAGNOSIS — Y999 Unspecified external cause status: Secondary | ICD-10-CM | POA: Insufficient documentation

## 2016-03-30 DIAGNOSIS — Z7901 Long term (current) use of anticoagulants: Secondary | ICD-10-CM | POA: Diagnosis not present

## 2016-03-30 DIAGNOSIS — S0031XA Abrasion of nose, initial encounter: Secondary | ICD-10-CM

## 2016-03-30 DIAGNOSIS — S0083XA Contusion of other part of head, initial encounter: Secondary | ICD-10-CM | POA: Insufficient documentation

## 2016-03-30 DIAGNOSIS — R930 Abnormal findings on diagnostic imaging of skull and head, not elsewhere classified: Secondary | ICD-10-CM | POA: Insufficient documentation

## 2016-03-30 DIAGNOSIS — T1490XA Injury, unspecified, initial encounter: Secondary | ICD-10-CM | POA: Diagnosis not present

## 2016-03-30 DIAGNOSIS — E039 Hypothyroidism, unspecified: Secondary | ICD-10-CM | POA: Insufficient documentation

## 2016-03-30 DIAGNOSIS — W06XXXA Fall from bed, initial encounter: Secondary | ICD-10-CM | POA: Insufficient documentation

## 2016-03-30 DIAGNOSIS — S199XXA Unspecified injury of neck, initial encounter: Secondary | ICD-10-CM | POA: Diagnosis not present

## 2016-03-30 DIAGNOSIS — R402411 Glasgow coma scale score 13-15, in the field [EMT or ambulance]: Secondary | ICD-10-CM | POA: Diagnosis not present

## 2016-03-30 DIAGNOSIS — Y929 Unspecified place or not applicable: Secondary | ICD-10-CM | POA: Insufficient documentation

## 2016-03-30 DIAGNOSIS — Y939 Activity, unspecified: Secondary | ICD-10-CM | POA: Insufficient documentation

## 2016-03-30 DIAGNOSIS — S0990XA Unspecified injury of head, initial encounter: Secondary | ICD-10-CM | POA: Diagnosis not present

## 2016-03-30 DIAGNOSIS — W19XXXA Unspecified fall, initial encounter: Secondary | ICD-10-CM

## 2016-03-30 DIAGNOSIS — S0993XA Unspecified injury of face, initial encounter: Secondary | ICD-10-CM | POA: Diagnosis not present

## 2016-03-30 LAB — URINE MICROSCOPIC-ADD ON

## 2016-03-30 LAB — URINALYSIS, ROUTINE W REFLEX MICROSCOPIC
Bilirubin Urine: NEGATIVE
Glucose, UA: NEGATIVE mg/dL
Ketones, ur: NEGATIVE mg/dL
Nitrite: NEGATIVE
Protein, ur: NEGATIVE mg/dL
Specific Gravity, Urine: 1.019 (ref 1.005–1.030)
pH: 7 (ref 5.0–8.0)

## 2016-03-30 LAB — I-STAT CHEM 8, ED
BUN: 33 mg/dL — AB (ref 6–20)
CHLORIDE: 105 mmol/L (ref 101–111)
CREATININE: 0.9 mg/dL (ref 0.44–1.00)
Calcium, Ion: 1.14 mmol/L — ABNORMAL LOW (ref 1.15–1.40)
Glucose, Bld: 89 mg/dL (ref 65–99)
HEMATOCRIT: 39 % (ref 36.0–46.0)
Hemoglobin: 13.3 g/dL (ref 12.0–15.0)
Potassium: 4.6 mmol/L (ref 3.5–5.1)
Sodium: 139 mmol/L (ref 135–145)
TCO2: 26 mmol/L (ref 0–100)

## 2016-03-30 LAB — PROTIME-INR
INR: 0.95
Prothrombin Time: 12.7 s (ref 11.4–15.2)

## 2016-03-30 NOTE — ED Provider Notes (Signed)
[facility] DEPT Provider Note   CSN: [id number] Arrival date & time: 03/30/16  1942  By signing my name below, I, Royce Macadamia, attest that this documentation has been prepared under the direction and in the presence of  [employer], [place]. Electronically Signed: Royce Macadamia, ED Scribe. 03/30/16. 8:32 PM.   History   Chief Complaint Chief Complaint  Patient presents with  . Fall   The history is provided by the patient, medical records and a relative. No language interpreter was used.    HPI Comments:  Rebecca [name] is a 82 y.o. female with history brought in by ambulance, who presents to the Emergency Department s/p fall complaining of a hematoma to her forehead.  Pt reports that her hematoma has improved significantly since the fall.  She also notes subsequent nosebleed. Bleeding stopped PTA on its own.  She was trying to reach out of her bed to get something when she lost her balance and fell out of bed.  Her son reports that she has been "clumsy" for about a year and a half.  Pt was seen in the ED on 03/05/16 after a fall.  As a result her doctor took her off of eliquis 5 days ago.  Pt was on eliquis for PE secondary to PNA.  She denies LOC, SOB, chest pain, nausea, focal numbness and new weakness.      Past Medical History:  Diagnosis Date  . Anemia, unspecified 03/18/2011  . Anxiety state, unspecified 01/2011  . Blood in stool 06/15/2012  . Corns and callosities 07/01/2011  . Diverticulosis 02/2011  . Dizziness   . DJD (degenerative joint disease)   . Dysuria 06/17/2011  . Hemorrhoids 02/2011  . Low back pain   . Mitral valve problem thickened   thickened  . Osteoarthritis of both hands 10/09/2015  . Osteoporosis 02/2011  . Other abnormal blood chemistry 0/30/2012  . Other acquired deformity of toe 12/16/2011  . Pain in joint, site unspecified 01/2012  . 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 Sacroiliitis, not elsewhere  classified ([place]) 05/2011  . Seizures ([place])    Remotely  . Unspecified hypothyroidism 01/2011  . 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 Vitamin D deficiency 01/2011    Patient Active Problem List   Diagnosis Date Noted  . Contusion of scalp 03/10/2016  . Acute upper respiratory infection 02/05/2016  . Rash and nonspecific skin eruption 01/29/2016  . Osteoarthritis of both hands 10/09/2015  . Back pain 06/01/2015  . Weak 04/16/2015  . Unstable gait 04/16/2015  . Protein-calorie malnutrition, severe 04/02/2015  . Elevated troponin 03/28/2015  . Abnormal finding on EKG-new inferior/ septal TWI 03/28/2015  . DNR no code (do not resuscitate) 03/28/2015  . Hypoxemic respiratory failure, chronic ([place]) 03/28/2015  . DVT, femoral, acute ([place]) 03/28/2015  . Chronic pulmonary embolism ([place]) 03/28/2015  . Headache 03/23/2015  . Dyslipidemia 03/12/2015  . Seizure disorder ([place]) 03/11/2015  . Right hip pain 01/11/2015  . Depression 09/26/2014  . Memory impairment- SNF/ assited living pt 09/19/2012  . Hypothyroidism 12/08/2006    Past Surgical History:  Procedure Laterality Date  . bletheroplasty    . CATARACT EXTRACTION W/ INTRAOCULAR LENS  IMPLANT, BILATERAL  2010  . CERVICAL LAMINECTOMY  2005  . HAMMER TOE SURGERY  2013   right 2nd toe Dr. Mallie Mussel  . JOINT REPLACEMENT Bilateral 2002 Right, 1992 Left   knees    OB History    No  data available       Home Medications    Prior to Admission medications   Medication Sig Start Date End Date Taking? Authorizing Provider  acetaminophen (TYLENOL) 325 MG tablet Take 325 mg by mouth. Take 2 tablets twice daily   Yes Historical Provider, MD  amoxicillin (AMOXIL) 500 MG capsule Take 2,000 mg by mouth as directed. 1 Hour Before Procedure.   Yes Historical Provider, MD  Calcium Carbonate-Vitamin D (CALCIUM 600+D) 600-400 MG-UNIT tablet Take 1 tablet by mouth daily.   Yes Historical Provider, MD  Cholecalciferol  (VITAMIN D3) 5000 units TABS Take 1 tablet by mouth daily. Take one tablet daily    Yes Historical Provider, MD  citalopram (CELEXA) 10 MG tablet One daily to treat depression and anxiety Patient taking differently: Take 10 mg by mouth daily. One daily to treat depression and anxiety 10/09/15  Yes Estill Dooms, MD  clonazePAM (KLONOPIN) 0.5 MG tablet Take 1 tablet (0.5 mg total) by mouth 2 (two) times daily as needed (anxiety). Patient taking differently: Take 0.5 mg by mouth 2 (two) times daily.  03/17/15  Yes Robbie Lis, MD  donepezil (ARICEPT) 10 MG tablet Take 10 mg by mouth daily. Reported on 07/10/2015   Yes Historical Provider, MD  fluticasone furoate-vilanterol (BREO ELLIPTA) 100-25 MCG/INH AEPB Inhale 1 puff into the lungs daily.   Yes Historical Provider, MD  HYDROcodone-acetaminophen (NORCO/VICODIN) 5-325 MG tablet Take 0.5 tablets by mouth 2 (two) times daily as needed for moderate pain or severe pain.  03/11/16  Yes Historical Provider, MD  levothyroxine (SYNTHROID, LEVOTHROID) 100 MCG tablet Take 1 tablet (100 mcg total) by mouth daily before breakfast. For thyroid 09/28/14  Yes Estill Dooms, MD  meloxicam (MOBIC) 7.5 MG tablet Take 7.5 mg by mouth daily.   Yes Historical Provider, MD  memantine (NAMENDA) 10 MG tablet Take 10 mg by mouth 2 (two) times daily.   Yes Historical Provider, MD  Multiple Vitamin (TAB-A-VITE PO) Take 1 tablet by mouth daily.   Yes Historical Provider, MD  Omega-3 1000 MG CAPS Take 1 capsule by mouth daily. Take one capsule every morning    Yes Historical Provider, MD  phenytoin (DILANTIN) 100 MG ER capsule Take 100-200 mg by mouth 2 (two) times daily. Take one capsule every morning; take 2 capsules at bedtime    Yes Historical Provider, MD  apixaban (ELIQUIS) 2.5 MG TABS tablet Take 2.5 mg by mouth 2 (two) times daily.    Historical Provider, MD  triamcinolone cream (KENALOG) 0.1 %  03/06/16   Historical Provider, MD    Family History Family History    Problem Relation Age of Onset  . Alzheimer's disease Sister   . Emphysema Sister     Social History Social History  Substance Use Topics  . Smoking status: Former Smoker    Quit date: 09/14/1988  . Smokeless tobacco: Never Used     Comment: Smoked <1ppd  . Alcohol use 0.6 oz/week    1 Glasses of wine per week     Comment: doesn't drink routinely & only on social occasions     Allergies   Macrodantin [nitrofurantoin]; Morphine and related; and Sulfa antibiotics   Review of Systems Review of Systems  Respiratory: Negative for shortness of breath.   Cardiovascular: Negative for chest pain.  Gastrointestinal: Negative for nausea.  Skin: Positive for color change and wound.  Neurological: Negative for syncope.  Ten systems reviewed and are negative for acute change, except as noted  in the HPI.     Physical Exam Updated Vital Signs BP 132/64   Pulse 63   Temp 98.7 F (37.1 C)   Resp 18   Ht 5' (1.524 m)   Wt 60.8 kg   SpO2 93%   BMI 26.17 kg/m   Physical Exam  Constitutional: She is oriented to person, place, and time. She appears well-developed and well-nourished. No distress.  Nontoxic and in NAD  HENT:  Head: Normocephalic.  Mouth/Throat: Oropharynx is clear and moist.  Hematoma to forehead. Abrasion to bridge of nose. No battle's sign or racoon's eyes. No septal deviation or hematoma. Symmetric rise of the uvula with phonation.  Eyes: Conjunctivae and EOM are normal. No scleral icterus.  Neck: Normal range of motion.  Normal ROM appreciated  Cardiovascular: Normal rate, regular rhythm and intact distal pulses.   Pulmonary/Chest: Effort normal. No respiratory distress. She has no wheezes.  Respirations even and unlabored. No chest wall tenderness or crepitus. Chest expansion symmetric.  Musculoskeletal: Normal range of motion.  Neurological: She is alert and oriented to person, place, and time. No sensory deficit. Coordination normal.  GCS 15. Speech is goal  oriented. No focal deficits appreciated. Patient moving all extremities.  Skin: Skin is warm and dry. No rash noted. She is not diaphoretic. No erythema. No pallor.  Psychiatric: She has a normal mood and affect. Her behavior is normal.  Nursing note and vitals reviewed.    ED Treatments / Results   DIAGNOSTIC STUDIES:  Oxygen Saturation is 96% on RA, NML by my interpretation.    COORDINATION OF CARE:  8:30 PM Will order a CT of her head an face.  Discussed treatment plan with pt at bedside and pt agreed to plan.  Labs (all labs ordered are listed, but only abnormal results are displayed) Labs Reviewed  URINALYSIS, ROUTINE W REFLEX MICROSCOPIC (NOT AT [facility]) - Abnormal; Notable for the following:       Result Value   Hgb urine dipstick MODERATE (*)    Leukocytes, UA SMALL (*)    All other components within normal limits  URINE MICROSCOPIC-ADD ON - Abnormal; Notable for the following:    Squamous Epithelial / LPF 0-5 (*)    Bacteria, UA FEW (*)    All other components within normal limits  I-STAT CHEM 8, ED - Abnormal; Notable for the following:    BUN 33 (*)    Calcium, Ion 1.14 (*)    All other components within normal limits  URINE CULTURE  PROTIME-INR    EKG  EKG Interpretation None       Radiology Ct Head Wo Contrast  Result Date: 03/30/2016 CLINICAL DATA:  [place] tonight. EXAM: CT HEAD WITHOUT CONTRAST CT MAXILLOFACIAL WITHOUT CONTRAST CT CERVICAL SPINE WITHOUT CONTRAST TECHNIQUE: Multidetector CT imaging of the head, cervical spine, and maxillofacial structures were performed using the standard protocol without intravenous contrast. Multiplanar CT image reconstructions of the cervical spine and maxillofacial structures were also generated. COMPARISON:  03/05/2016 FINDINGS: CT HEAD FINDINGS Brain: No evidence of acute infarction, hemorrhage, hydrocephalus, extra-axial collection or mass lesion/mass effect. Mild generalized atrophy. Mild white matter hypodensity  consistent with small vessel ischemic disease. Stable encephalomalacia due to remote left occipital infarction. Vascular: Atherosclerotic calcification of the siphonous portions of the intracranial ICA bilaterally, unchanged. No acute vascular findings. Skull: Normal. Negative for fracture or focal lesion. Other: Frontal scalp hematoma, right greater than left. CT MAXILLOFACIAL FINDINGS Osseous: No fracture or mandibular dislocation. No destructive process. Orbits: Negative.  No traumatic or inflammatory finding. Sinuses: Clear. Soft tissues: Negative. CT CERVICAL SPINE FINDINGS The vertebral column, pedicles and facet articulations are intact. There is no evidence of acute fracture. No acute soft tissue abnormalities are evident. There is mild degenerative spondylolisthesis at multiple levels. Moderately severe degenerative cervical disc disease is present, as well as moderate cervical facet arthritis. This is unchanged. Right apical lung opacity, incompletely imaged and indeterminate although the visible portion appears to be unchanged from 04/15/2013. IMPRESSION: 1. No acute intracranial findings. There is moderate generalized atrophy and chronic appearing white matter hypodensities which likely represent small vessel ischemic disease. Remote left occipital infarction with stable encephalomalacia. 2. Negative for acute maxillofacial fracture 3. Negative for acute cervical spine fracture Electronically Signed   By: Andreas Newport M.D.   On: 03/30/2016 22:06   Ct Cervical Spine Wo Contrast  Result Date: 03/30/2016 CLINICAL DATA:  [place] tonight. EXAM: CT HEAD WITHOUT CONTRAST CT MAXILLOFACIAL WITHOUT CONTRAST CT CERVICAL SPINE WITHOUT CONTRAST TECHNIQUE: Multidetector CT imaging of the head, cervical spine, and maxillofacial structures were performed using the standard protocol without intravenous contrast. Multiplanar CT image reconstructions of the cervical spine and maxillofacial structures were also  generated. COMPARISON:  03/05/2016 FINDINGS: CT HEAD FINDINGS Brain: No evidence of acute infarction, hemorrhage, hydrocephalus, extra-axial collection or mass lesion/mass effect. Mild generalized atrophy. Mild white matter hypodensity consistent with small vessel ischemic disease. Stable encephalomalacia due to remote left occipital infarction. Vascular: Atherosclerotic calcification of the siphonous portions of the intracranial ICA bilaterally, unchanged. No acute vascular findings. Skull: Normal. Negative for fracture or focal lesion. Other: Frontal scalp hematoma, right greater than left. CT MAXILLOFACIAL FINDINGS Osseous: No fracture or mandibular dislocation. No destructive process. Orbits: Negative. No traumatic or inflammatory finding. Sinuses: Clear. Soft tissues: Negative. CT CERVICAL SPINE FINDINGS The vertebral column, pedicles and facet articulations are intact. There is no evidence of acute fracture. No acute soft tissue abnormalities are evident. There is mild degenerative spondylolisthesis at multiple levels. Moderately severe degenerative cervical disc disease is present, as well as moderate cervical facet arthritis. This is unchanged. Right apical lung opacity, incompletely imaged and indeterminate although the visible portion appears to be unchanged from 04/15/2013. IMPRESSION: 1. No acute intracranial findings. There is moderate generalized atrophy and chronic appearing white matter hypodensities which likely represent small vessel ischemic disease. Remote left occipital infarction with stable encephalomalacia. 2. Negative for acute maxillofacial fracture 3. Negative for acute cervical spine fracture Electronically Signed   By: Andreas Newport M.D.   On: 03/30/2016 22:06   Ct Maxillofacial Wo Contrast  Result Date: 03/30/2016 CLINICAL DATA:  [place] tonight. EXAM: CT HEAD WITHOUT CONTRAST CT MAXILLOFACIAL WITHOUT CONTRAST CT CERVICAL SPINE WITHOUT CONTRAST TECHNIQUE: Multidetector CT imaging  of the head, cervical spine, and maxillofacial structures were performed using the standard protocol without intravenous contrast. Multiplanar CT image reconstructions of the cervical spine and maxillofacial structures were also generated. COMPARISON:  03/05/2016 FINDINGS: CT HEAD FINDINGS Brain: No evidence of acute infarction, hemorrhage, hydrocephalus, extra-axial collection or mass lesion/mass effect. Mild generalized atrophy. Mild white matter hypodensity consistent with small vessel ischemic disease. Stable encephalomalacia due to remote left occipital infarction. Vascular: Atherosclerotic calcification of the siphonous portions of the intracranial ICA bilaterally, unchanged. No acute vascular findings. Skull: Normal. Negative for fracture or focal lesion. Other: Frontal scalp hematoma, right greater than left. CT MAXILLOFACIAL FINDINGS Osseous: No fracture or mandibular dislocation. No destructive process. Orbits: Negative. No traumatic or inflammatory finding. Sinuses: Clear. Soft tissues: Negative. CT CERVICAL SPINE  FINDINGS The vertebral column, pedicles and facet articulations are intact. There is no evidence of acute fracture. No acute soft tissue abnormalities are evident. There is mild degenerative spondylolisthesis at multiple levels. Moderately severe degenerative cervical disc disease is present, as well as moderate cervical facet arthritis. This is unchanged. Right apical lung opacity, incompletely imaged and indeterminate although the visible portion appears to be unchanged from 04/15/2013. IMPRESSION: 1. No acute intracranial findings. There is moderate generalized atrophy and chronic appearing white matter hypodensities which likely represent small vessel ischemic disease. Remote left occipital infarction with stable encephalomalacia. 2. Negative for acute maxillofacial fracture 3. Negative for acute cervical spine fracture Electronically Signed   By: Andreas Newport M.D.   On: 03/30/2016  22:06    Procedures Procedures (including critical care time)  Medications Ordered in ED Medications - No data to display   Initial Impression / Assessment and Plan / ED Course  I have reviewed the triage vital signs and the nursing notes.  Pertinent labs & imaging results that were available during my care of the patient were reviewed by me and considered in my medical decision making (see chart for details).  Clinical Course     82 year-old female presents to the emergency department after a mechanical fall from bed. Patient fell onto her face. She denies loss of consciousness. She has had no nausea or vomiting. No complaints of pain anywhere other than her forehead and nose. Patient is no longer on Eliquis. This was discontinued 3 days ago after a fall in October and consultation with her PCP. CT imaging today is reassuring. There is no evidence of acute dramatic injury. Urinalysis suggests contamination. Will send for culture. Hemoglobin and hematocrit stable.   Given reassuring workup and physical exam, I believe the patient is stable for discharge if further management on an outpatient basis. Primary care follow-up advised and return precautions given. Patient discharged in stable condition. Patient seen also by my attending, Dr. Zenia Resides, who is in agreement with this workup, assessment, management plan, and patient's stability for discharge.   Final Clinical Impressions(s) / ED Diagnoses   Final diagnoses:  Contusion of face, initial encounter  Abrasion of nose, initial encounter  Fall, initial encounter    New Prescriptions New Prescriptions   No medications on file    I personally performed the services described in this documentation, which was scribed in my presence. The recorded information has been reviewed and is accurate.       Antonietta Breach, PA-C 03/30/16 [id number]

## 2016-03-30 NOTE — ED Triage Notes (Signed)
Per [facility], pt fell OOB, denies LOC or dizziness pre or post fall.  Pt presents with hematoma to forehead.

## 2016-03-30 NOTE — ED Notes (Signed)
Pt placed on monitor.  Pt removed from bedpan.

## 2016-03-30 NOTE — ED Provider Notes (Signed)
Medical screening examination/treatment/procedure(s) were conducted as a shared visit with non-physician practitioner(s) and myself.  I personally evaluated the patient during the encounter.   EKG Interpretation None     Patient here after mechanical fall at assisted living from her bed. Did strike her head. CT of head and face without acute injury. Urinalysis noted and urine culture to be sent. Patient nontender along her rib cage. Patient stable for discharge   Lacretia Leigh, MD 03/30/16 2243

## 2016-03-30 NOTE — Discharge Instructions (Signed)
Take tylenol as needed for pain. Apply ice to areas of injury to limit swelling. Follow up with your primary care doctor as needed. Return for new or concerning symptoms.

## 2016-04-01 ENCOUNTER — Non-Acute Institutional Stay: Payer: Medicare Other | Admitting: Nurse Practitioner

## 2016-04-01 ENCOUNTER — Encounter: Payer: Self-pay | Admitting: Nurse Practitioner

## 2016-04-01 DIAGNOSIS — R413 Other amnesia: Secondary | ICD-10-CM

## 2016-04-01 DIAGNOSIS — S0083XD Contusion of other part of head, subsequent encounter: Secondary | ICD-10-CM

## 2016-04-01 DIAGNOSIS — F320 Major depressive disorder, single episode, mild: Secondary | ICD-10-CM

## 2016-04-01 DIAGNOSIS — E039 Hypothyroidism, unspecified: Secondary | ICD-10-CM

## 2016-04-01 DIAGNOSIS — M5440 Lumbago with sciatica, unspecified side: Secondary | ICD-10-CM

## 2016-04-01 DIAGNOSIS — G40909 Epilepsy, unspecified, not intractable, without status epilepticus: Secondary | ICD-10-CM

## 2016-04-01 LAB — URINE CULTURE

## 2016-04-01 NOTE — Progress Notes (Signed)
Location:  [place] Room Number: 36 Place of Service:  ALF [phone]) Provider:  Macarena Langseth, Manxie  NP  Jeanmarie Hubert, MD  Patient Care Team: Estill Dooms, MD as PCP - General (Internal Medicine) [facility] [name], MD as Consulting Physician (Orthopedic Surgery) [name], MD as Consulting Physician (Physical Medicine and Rehabilitation) Melina Schools, MD as Consulting Physician (Orthopedic Surgery) [name], MD as Consulting Physician (Otolaryngology) Edwardine Deschepper Otho Darner, NP as Nurse Practitioner (Internal Medicine)  Extended Emergency Contact Information Primary Emergency Contact: Baxley,[name] Address: [address], [place] Phone: [phone] Mobile Phone: [phone] Relation: Son Secondary Emergency Contact: [name] [place] Phone: [phone] Mobile Phone: [phone] Relation: Daughter  Code Status:  DNR Goals of care: Advanced Directive information Advanced Directives 04/01/2016  Does patient have an advance directive? Yes  Type of Paramedic of [place];Living will;Out of facility DNR (pink MOST or yellow form)  Does patient want to make changes to advanced directive? No - Patient declined  Copy of advanced directive(s) in chart? Yes  Pre-existing out of facility DNR order (yellow form or pink MOST form) -     Chief Complaint  Patient presents with  . Hospitalization Follow-up    HPI:  Pt is a 82 y.o. female seen today for f/u ED evaluation 03/30/16 for facial contusion sustained from falling out of bed. Off Eliquis 5 days for Hx of PE due to her frequent falling. CT head, cervical spine, maxillofacial 03/30/16 negative for acute intracranial changes or fxs   Hx of Seizure, on Dilantin '100mg'$  am '200mg'$  hs, no active seizure activity since last visited, her mood is managed with Celexa '10mg'$ , clonazepam bid,  hypothyroidism, on Levothyroxine  120mg, last TSH 09/27/15 TSH 1.96. Dementia, resides at AL, takign Aricept and Namenda,  forgetful. Hx of PE, on Eliquis. Lower back pain, taking Mobic 7.'5mg'$ , Tylenol '325mg'$  bid  Past Medical History:  Diagnosis Date  . Anemia, unspecified 03/18/2011  . Anxiety state, unspecified 01/2011  . Blood in stool 06/15/2012  . Corns and callosities 07/01/2011  . Diverticulosis 02/2011  . Dizziness   . DJD (degenerative joint disease)   . Dysuria 06/17/2011  . Hemorrhoids 02/2011  . Low back pain   . Mitral valve problem thickened   thickened  . Osteoarthritis of both hands 10/09/2015  . Osteoporosis 02/2011  . Other abnormal blood chemistry 0/30/2012  . Other acquired deformity of toe 12/16/2011  . Pain in joint, site unspecified 01/2012  . 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 [place] elsewhere [place]  . [place]    Remot[place]  . Unspecified hypothyroidism 01/2011  . 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 Vitamin D deficiency 01/2011   Past Surgical History:  Procedure Laterality Date  . bletheroplasty    . CATARACT EXTRACTION W/ INTRAOCULAR LENS  IMPLANT, BILATERAL  2010  . CERVICAL LAMINECTOMY  2005  . HAMMER TOE SURGERY  2013   right 2nd toe Dr. HMallie Mussel . JOINT REPLACEMENT Bilateral 2002 Right, 1992 Left   knees    Allergies  Allergen Reactions  . Macrodantin [Nitrofurantoin]   . Morphine And Related Other (See Comments)    Altered mental state   . Sulfa Antibiotics Other (See Comments)    Reaction: unknown      Medication List  Accurate as of 04/01/16  3:41 PM. Always use your most recent med list.          acetaminophen 325 MG tablet Commonly known as:  TYLENOL Take 325 mg by mouth. Take 2 tablets twice daily   amoxicillin 500 MG capsule Commonly known as:  AMOXIL Take 2,000 mg by mouth as directed. 1 Hour Before Procedure.   BREO ELLIPTA 100-25 MCG/INH Aepb Generic drug:   fluticasone furoate-vilanterol Inhale 1 puff into the lungs daily.   CALCIUM 600+D 600-400 MG-UNIT tablet Generic drug:  Calcium Carbonate-Vitamin D Take 1 tablet by mouth daily.   citalopram 10 MG tablet Commonly known as:  CELEXA One daily to treat depression and anxiety   clonazePAM 0.5 MG tablet Commonly known as:  KLONOPIN Take 1 tablet (0.5 mg total) by mouth 2 (two) times daily as needed (anxiety).   levothyroxine 100 MCG tablet Commonly known as:  SYNTHROID, LEVOTHROID Take 1 tablet (100 mcg total) by mouth daily before breakfast. For thyroid   meloxicam 7.5 MG tablet Commonly known as:  MOBIC Take 7.5 mg by mouth daily.   memantine 10 MG tablet Commonly known as:  NAMENDA Take 10 mg by mouth 2 (two) times daily.   Omega-3 1000 MG Caps Take 1 capsule by mouth daily. Take one capsule every morning   phenytoin 100 MG ER capsule Commonly known as:  DILANTIN Take 100-200 mg by mouth 2 (two) times daily. Take one capsule every morning; take 2 capsules at bedtime   TAB-A-VITE PO Take 1 tablet by mouth daily.   triamcinolone cream 0.1 % Commonly known as:  KENALOG   Vitamin D3 5000 units Tabs Take 1 tablet by mouth daily. Take one tablet daily       Review of Systems  Constitutional: Negative for diaphoresis.  HENT: Positive for hearing loss. Negative for congestion, ear discharge, ear pain, nosebleeds, sore throat and tinnitus.        Bilateral hearing loss. June 2015 ruptured right Tm with drainage, but little pain.  Eyes: Negative.   Respiratory: Positive for shortness of breath.        History of dyspnea on exertion. Remains on oxygen 24/7 at this time. History of pulmonary embolism and [place] filter. Remains on warfarin.  Cardiovascular: Negative.   Gastrointestinal: Negative.   Endocrine: Negative for polydipsia.  Genitourinary: Positive for frequency.       2-3x/night  Musculoskeletal: Positive for arthralgias (both hands), back pain and gait  problem (unstable using walker).       Osteoarthritis with complaints of pain in multiple areas. Neck and right shoulder pains.  [place] 05/20/15 C/o back pain C/o right hip pain  Skin: Positive for rash (on neck).       Senile ecchymoses. Peri orbital contusion.   Allergic/Immunologic: Negative for environmental allergies.  Neurological: Positive for weakness. Negative for dizziness, tremors and seizures.       Hx seizures.  Head trauma sustained in a fall on 10/05/27/15.  Psychiatric/Behavioral: Positive for confusion, decreased concentration and dysphoric mood. Negative for suicidal ideas. The patient is nervous/anxious.        Chronic anxiety. Memory deficits.    Immunization History  Administered Date(s) Administered  . Influenza Whole 03/17/2007, 02/16/2009, 01/30/2010, 02/16/2013  . Influenza-Unspecified 03/02/2014, 02/22/2015, 02/28/2016  . PPD Test 07/09/2010  . Pneumococcal Polysaccharide-23 05/19/2005, 05/23/2013  . Td 05/19/2005  . Tetanus 10/06/2012   Pertinent  Health Maintenance Due  Topic Date Due  . DEXA SCAN  11/19/1994  .  PNA vac Low Risk Adult (2 of 2 - PCV13) 05/23/2014  . INFLUENZA VACCINE  Completed   Fall Risk  10/09/2015 07/10/2015 03/26/2015 12/19/2014 03/22/2013  Falls in the past year? No Yes Yes Yes Yes  Number falls in past yr: - '1 1 1 2 '$ or more  Injury with Fall? - Yes No Yes -   Functional Status Survey:    Vitals:   04/01/16 1416  BP: 119/69  Pulse: 80  Resp: 18  Temp: 98.5 F (36.9 C)  Weight: 139 lb 3.2 oz (63.1 kg)  Height: 5' (1.524 m)   Body mass index is 27.19 kg/m. Physical Exam  Constitutional: She is oriented to person, place, and time. She appears well-developed and well-nourished. No distress.  HENT:  Bilateral hearing loss. Rupture of the right TM.   Eyes: Conjunctivae and EOM are normal. Pupils are equal, round, and reactive to light.  Lens implants in both eyes  Neck: Normal range of motion. Neck supple. No JVD present. No  tracheal deviation present. No thyromegaly present.  Cardiovascular: Normal rate, regular rhythm, normal heart sounds and intact distal pulses.  Exam reveals no gallop and no friction rub.   No murmur heard. Pulmonary/Chest: Effort normal and breath sounds normal. No respiratory distress. She has no wheezes. She exhibits no tenderness.  Abdominal: Soft. Bowel sounds are normal. She exhibits no distension and no mass. There is no tenderness.  Genitourinary: Rectal exam shows guaiac negative stool.  Genitourinary Comments: From previous exam: Hemorrhoids. Normal sphincter tone. Stools are normal brown color.  Musculoskeletal: Normal range of motion. She exhibits tenderness and deformity. She exhibits no edema.  Tender in the posterior neck. Mild restriction in movements. Hammertoes at the left second and third. Right second toe overlaps the great toe. SP surgical repair of the right 2nd toe.  Shoulders sore. Painful in the right shoulder when she lifts overhead and rotates at the shpoulder.[facility] July 2014. Pain in both hands and significant deformities related to OA.  Unstable gait. Using walker with 2 front wheels and rear skids. Weaker grip of the right hand.. Tender in the left SI joint. Tender in all enlarged finger joints.  Pain in the right hip, X-ray 03/10/16 no acute fx  Lymphadenopathy:    She has no cervical adenopathy.  Neurological: She is alert and oriented to person, place, and time. No cranial nerve deficit. Coordination normal.  No loss of grip strength. Loss of memory. 04/04/14 MMSE 25/30. Passed clock drawing. 07/04/14 MMSE 28/30. Failed clock drawing. Bilateral tremor.  Skin: Skin is warm and dry. Rash (erythematous blotches on the neck anteriorly) noted. No erythema. No pallor.  Peri orbital contusion.    Psychiatric: Thought content normal.  depressed    Labs reviewed:  Recent Labs  04/03/15 0655  09/27/15 12/13/15 03/05/16 1730 03/30/16 2033  NA 141  < > 141  142 140 139  K 3.9  < > 4.2 4.4 4.8 4.6  CL 103  --   --   --  105 105  CO2 31  --   --   --  29  --   GLUCOSE 103*  --   --   --  99 89  BUN 10  < > 29* 20 31* 33*  CREATININE 0.58  < > 0.6  --  0.95 0.90  CALCIUM 8.9  --   --   --  8.7*  --   < > = values in this interval not displayed.  Recent Labs  09/27/15 03/05/16 1730  AST 18 27  ALT 13 19  ALKPHOS 84 82  BILITOT  --  0.5  PROT  --  6.4*  ALBUMIN  --  3.5    Recent Labs  04/04/15 0519 12/13/15 03/05/16 1730 03/30/16 2033  WBC 8.6 6.1 5.9  --   NEUTROABS  --   --  3.9  --   HGB 11.6* 14.1 12.6 13.3  HCT 35.9*  --  39.4 39.0  MCV 99.4  --  99.5  --   PLT 255 190 173  --    Lab Results  Component Value Date   TSH 1.96 09/27/2015   Lab Results  Component Value Date   HGBA1C 5.4 03/08/2015   Lab Results  Component Value Date   CHOL 235 (H) 03/06/2009   HDL 108.40 03/06/2009   LDLDIRECT 108.2 03/06/2009    Significant Diagnostic Results in last 30 days:  Ct Head Wo Contrast  Result Date: 03/30/2016 CLINICAL DATA:  [place] tonight. EXAM: CT HEAD WITHOUT CONTRAST CT MAXILLOFACIAL WITHOUT CONTRAST CT CERVICAL SPINE WITHOUT CONTRAST TECHNIQUE: Multidetector CT imaging of the head, cervical spine, and maxillofacial structures were performed using the standard protocol without intravenous contrast. Multiplanar CT image reconstructions of the cervical spine and maxillofacial structures were also generated. COMPARISON:  03/05/2016 FINDINGS: CT HEAD FINDINGS Brain: No evidence of acute infarction, hemorrhage, hydrocephalus, extra-axial collection or mass lesion/mass effect. Mild generalized atrophy. Mild white matter hypodensity consistent with small vessel ischemic disease. Stable encephalomalacia due to remote left occipital infarction. Vascular: Atherosclerotic calcification of the siphonous portions of the intracranial ICA bilaterally, unchanged. No acute vascular findings. Skull: Normal. Negative for fracture or focal  lesion. Other: Frontal scalp hematoma, right greater than left. CT MAXILLOFACIAL FINDINGS Osseous: No fracture or mandibular dislocation. No destructive process. Orbits: Negative. No traumatic or inflammatory finding. Sinuses: Clear. Soft tissues: Negative. CT CERVICAL SPINE FINDINGS The vertebral column, pedicles and facet articulations are intact. There is no evidence of acute fracture. No acute soft tissue abnormalities are evident. There is mild degenerative spondylolisthesis at multiple levels. Moderately severe degenerative cervical disc disease is present, as well as moderate cervical facet arthritis. This is unchanged. Right apical lung opacity, incompletely imaged and indeterminate although the visible portion appears to be unchanged from 04/15/2013. IMPRESSION: 1. No acute intracranial findings. There is moderate generalized atrophy and chronic appearing white matter hypodensities which likely represent small vessel ischemic disease. Remote left occipital infarction with stable encephalomalacia. 2. Negative for acute maxillofacial fracture 3. Negative for acute cervical spine fracture Electronically Signed   By: Andreas Newport M.D.   On: 03/30/2016 22:06   Ct Cervical Spine Wo Contrast  Result Date: 03/30/2016 CLINICAL DATA:  [place] tonight. EXAM: CT HEAD WITHOUT CONTRAST CT MAXILLOFACIAL WITHOUT CONTRAST CT CERVICAL SPINE WITHOUT CONTRAST TECHNIQUE: Multidetector CT imaging of the head, cervical spine, and maxillofacial structures were performed using the standard protocol without intravenous contrast. Multiplanar CT image reconstructions of the cervical spine and maxillofacial structures were also generated. COMPARISON:  03/05/2016 FINDINGS: CT HEAD FINDINGS Brain: No evidence of acute infarction, hemorrhage, hydrocephalus, extra-axial collection or mass lesion/mass effect. Mild generalized atrophy. Mild white matter hypodensity consistent with small vessel ischemic disease. Stable encephalomalacia  due to remote left occipital infarction. Vascular: Atherosclerotic calcification of the siphonous portions of the intracranial ICA bilaterally, unchanged. No acute vascular findings. Skull: Normal. Negative for fracture or focal lesion. Other: Frontal scalp hematoma, right greater than left. CT MAXILLOFACIAL FINDINGS Osseous: No fracture or mandibular dislocation.  No destructive process. Orbits: Negative. No traumatic or inflammatory finding. Sinuses: Clear. Soft tissues: Negative. CT CERVICAL SPINE FINDINGS The vertebral column, pedicles and facet articulations are intact. There is no evidence of acute fracture. No acute soft tissue abnormalities are evident. There is mild degenerative spondylolisthesis at multiple levels. Moderately severe degenerative cervical disc disease is present, as well as moderate cervical facet arthritis. This is unchanged. Right apical lung opacity, incompletely imaged and indeterminate although the visible portion appears to be unchanged from 04/15/2013. IMPRESSION: 1. No acute intracranial findings. There is moderate generalized atrophy and chronic appearing white matter hypodensities which likely represent small vessel ischemic disease. Remote left occipital infarction with stable encephalomalacia. 2. Negative for acute maxillofacial fracture 3. Negative for acute cervical spine fracture Electronically Signed   By: Andreas Newport M.D.   On: 03/30/2016 22:06   Ct Maxillofacial Wo Contrast  Result Date: 03/30/2016 CLINICAL DATA:  [place] tonight. EXAM: CT HEAD WITHOUT CONTRAST CT MAXILLOFACIAL WITHOUT CONTRAST CT CERVICAL SPINE WITHOUT CONTRAST TECHNIQUE: Multidetector CT imaging of the head, cervical spine, and maxillofacial structures were performed using the standard protocol without intravenous contrast. Multiplanar CT image reconstructions of the cervical spine and maxillofacial structures were also generated. COMPARISON:  03/05/2016 FINDINGS: CT HEAD FINDINGS Brain: No  evidence of acute infarction, hemorrhage, hydrocephalus, extra-axial collection or mass lesion/mass effect. Mild generalized atrophy. Mild white matter hypodensity consistent with small vessel ischemic disease. Stable encephalomalacia due to remote left occipital infarction. Vascular: Atherosclerotic calcification of the siphonous portions of the intracranial ICA bilaterally, unchanged. No acute vascular findings. Skull: Normal. Negative for fracture or focal lesion. Other: Frontal scalp hematoma, right greater than left. CT MAXILLOFACIAL FINDINGS Osseous: No fracture or mandibular dislocation. No destructive process. Orbits: Negative. No traumatic or inflammatory finding. Sinuses: Clear. Soft tissues: Negative. CT CERVICAL SPINE FINDINGS The vertebral column, pedicles and facet articulations are intact. There is no evidence of acute fracture. No acute soft tissue abnormalities are evident. There is mild degenerative spondylolisthesis at multiple levels. Moderately severe degenerative cervical disc disease is present, as well as moderate cervical facet arthritis. This is unchanged. Right apical lung opacity, incompletely imaged and indeterminate although the visible portion appears to be unchanged from 04/15/2013. IMPRESSION: 1. No acute intracranial findings. There is moderate generalized atrophy and chronic appearing white matter hypodensities which likely represent small vessel ischemic disease. Remote left occipital infarction with stable encephalomalacia. 2. Negative for acute maxillofacial fracture 3. Negative for acute cervical spine fracture Electronically Signed   By: Andreas Newport M.D.   On: 03/30/2016 22:06    Assessment/Plan Facial contusion, subsequent encounter ED evaluation 03/30/16 for facial contusion sustained from falling out of bed. Off Eliquis 5 days for Hx of PE due to her frequent falling. CT head, cervical spine, maxillofacial 03/30/16 negative for acute intracranial changes or  fxs   Hypothyroidism Continue Levothyroxine 193mg, 09/27/15 TSH 1.96   Memory impairment- SNF/ assited living pt 03/20/15 MMSE 27/30, continue Aricept and Namenda   Depression Sleeps well, mood is stable, continue Celexa '10mg'$  daily, Clonazepam 0.'5mg'$  bid prn,  observe.      Back pain ivable, continue Mobic 7.'5mg'$  daily, increase Tylenol '650mg'$  to bid, may  lumbar spine/sacrum  X-ray if worsens.     Seizure disorder ([facility] Last seizure 2010 09/27/15 Dilantin lever 11(10-20) Continue Dilantin '100mg'$  am, '200mg'$  hs      Family/ staff Communication: continue [facility]  Labs/tests ordered:  none

## 2016-04-01 NOTE — Assessment & Plan Note (Signed)
Sleeps well, mood is stable, continue Celexa '10mg'$  daily, Clonazepam 0.'5mg'$  bid prn,  observe.

## 2016-04-01 NOTE — Assessment & Plan Note (Signed)
03/20/15 MMSE 27/30, continue Aricept and Namenda

## 2016-04-01 NOTE — Assessment & Plan Note (Signed)
Last seizure 2010 09/27/15 Dilantin lever 11(10-20) Continue Dilantin '100mg'$  am, '200mg'$  hs

## 2016-04-01 NOTE — Assessment & Plan Note (Signed)
Continue Levothyroxine 176mg, 09/27/15 TSH 1.96

## 2016-04-01 NOTE — Assessment & Plan Note (Signed)
ivable, continue Mobic 7.'5mg'$  daily, increase Tylenol '650mg'$  to bid, may  lumbar spine/sacrum  X-ray if worsens.

## 2016-04-01 NOTE — Assessment & Plan Note (Signed)
ED evaluation 03/30/16 for facial contusion sustained from falling out of bed. Off Eliquis 5 days for Hx of PE due to her frequent falling. CT head, cervical spine, maxillofacial 03/30/16 negative for acute intracranial changes or fxs

## 2016-04-02 ENCOUNTER — Other Ambulatory Visit: Payer: Self-pay | Admitting: *Deleted

## 2016-04-07 DIAGNOSIS — M25552 Pain in left hip: Secondary | ICD-10-CM | POA: Diagnosis not present

## 2016-04-07 DIAGNOSIS — M6281 Muscle weakness (generalized): Secondary | ICD-10-CM | POA: Diagnosis not present

## 2016-04-07 DIAGNOSIS — R2681 Unsteadiness on feet: Secondary | ICD-10-CM | POA: Diagnosis not present

## 2016-04-07 DIAGNOSIS — R296 Repeated falls: Secondary | ICD-10-CM | POA: Diagnosis not present

## 2016-04-07 DIAGNOSIS — R41841 Cognitive communication deficit: Secondary | ICD-10-CM | POA: Diagnosis not present

## 2016-04-11 DIAGNOSIS — M25552 Pain in left hip: Secondary | ICD-10-CM | POA: Diagnosis not present

## 2016-04-11 DIAGNOSIS — R41841 Cognitive communication deficit: Secondary | ICD-10-CM | POA: Diagnosis not present

## 2016-04-11 DIAGNOSIS — M6281 Muscle weakness (generalized): Secondary | ICD-10-CM | POA: Diagnosis not present

## 2016-04-11 DIAGNOSIS — R296 Repeated falls: Secondary | ICD-10-CM | POA: Diagnosis not present

## 2016-04-11 DIAGNOSIS — R2681 Unsteadiness on feet: Secondary | ICD-10-CM | POA: Diagnosis not present

## 2016-04-15 DIAGNOSIS — H6123 Impacted cerumen, bilateral: Secondary | ICD-10-CM | POA: Diagnosis not present

## 2016-04-16 DIAGNOSIS — R2681 Unsteadiness on feet: Secondary | ICD-10-CM | POA: Diagnosis not present

## 2016-04-16 DIAGNOSIS — R41841 Cognitive communication deficit: Secondary | ICD-10-CM | POA: Diagnosis not present

## 2016-04-16 DIAGNOSIS — M6281 Muscle weakness (generalized): Secondary | ICD-10-CM | POA: Diagnosis not present

## 2016-04-16 DIAGNOSIS — R296 Repeated falls: Secondary | ICD-10-CM | POA: Diagnosis not present

## 2016-04-16 DIAGNOSIS — M25552 Pain in left hip: Secondary | ICD-10-CM | POA: Diagnosis not present

## 2016-04-18 ENCOUNTER — Encounter: Payer: Self-pay | Admitting: Nurse Practitioner

## 2016-04-18 ENCOUNTER — Non-Acute Institutional Stay: Payer: Medicare Other | Admitting: Nurse Practitioner

## 2016-04-18 DIAGNOSIS — R41841 Cognitive communication deficit: Secondary | ICD-10-CM | POA: Diagnosis not present

## 2016-04-18 DIAGNOSIS — G40909 Epilepsy, unspecified, not intractable, without status epilepticus: Secondary | ICD-10-CM

## 2016-04-18 DIAGNOSIS — E039 Hypothyroidism, unspecified: Secondary | ICD-10-CM

## 2016-04-18 DIAGNOSIS — F320 Major depressive disorder, single episode, mild: Secondary | ICD-10-CM

## 2016-04-18 DIAGNOSIS — R2681 Unsteadiness on feet: Secondary | ICD-10-CM | POA: Diagnosis not present

## 2016-04-18 DIAGNOSIS — R413 Other amnesia: Secondary | ICD-10-CM | POA: Diagnosis not present

## 2016-04-18 DIAGNOSIS — I2601 Septic pulmonary embolism with acute cor pulmonale: Secondary | ICD-10-CM

## 2016-04-18 DIAGNOSIS — R296 Repeated falls: Secondary | ICD-10-CM | POA: Diagnosis not present

## 2016-04-18 DIAGNOSIS — M270 Developmental disorders of jaws: Secondary | ICD-10-CM | POA: Diagnosis not present

## 2016-04-18 DIAGNOSIS — M25552 Pain in left hip: Secondary | ICD-10-CM | POA: Diagnosis not present

## 2016-04-18 DIAGNOSIS — I2782 Chronic pulmonary embolism: Secondary | ICD-10-CM

## 2016-04-18 DIAGNOSIS — M5440 Lumbago with sciatica, unspecified side: Secondary | ICD-10-CM

## 2016-04-18 DIAGNOSIS — M6281 Muscle weakness (generalized): Secondary | ICD-10-CM | POA: Diagnosis not present

## 2016-04-18 NOTE — Assessment & Plan Note (Signed)
Off anticoagulation therapy.

## 2016-04-18 NOTE — Assessment & Plan Note (Signed)
Last seizure 2010 09/27/15 Dilantin lever 11(10-20) Continue Dilantin '100mg'$  am, '200mg'$  hs

## 2016-04-18 NOTE — Assessment & Plan Note (Addendum)
continue Celexa '10mg'$  daily, Clonazepam 0.'5mg'$  bid, observe.

## 2016-04-18 NOTE — Assessment & Plan Note (Signed)
05/19/14 MMSE 27/30, continue Aricept and Namenda 

## 2016-04-18 NOTE — Progress Notes (Signed)
Location:  [facility] Room Number: 36 Place of Service:  ALF [phone]) Provider:  Ivadell Gaul, Manxie  NP  Jeanmarie Hubert, MD  Patient Care Team: Estill Dooms, MD as PCP - General (Internal Medicine) [facility] [name], MD as Consulting Physician (Orthopedic Surgery) [name], MD as Consulting Physician (Physical Medicine and Rehabilitation) Melina Schools, MD as Consulting Physician (Orthopedic Surgery) [name], MD as Consulting Physician (Otolaryngology) Calil Amor Otho Darner, NP as Nurse Practitioner (Internal Medicine)  Extended Emergency Contact Information Primary Emergency Contact: Jacobson,[name] Address: [address], [place] Phone: [phone] Mobile Phone: [phone] Relation: Son Secondary Emergency Contact: [name] [place] Phone: [phone] Mobile Phone: [phone] Relation: Daughter  Code Status:  DNR Goals of care: Advanced Directive information Advanced Directives 04/18/2016  Does Patient Have a Medical Advance Directive? Yes  Type of Paramedic of [place];Living will  Does patient want to make changes to medical advance directive? No - Patient declined  Copy of [place] in Chart? Yes  Pre-existing out of facility DNR order (yellow form or pink MOST form) -     Chief Complaint  Patient presents with  . Acute Visit    Hard mass on the roof of mouth this am, denies any pain    HPI:  Pt is a 82 y.o. female seen today for an acute visit for agitated and crying at night, she takes Celexa '10mg'$  daily for mood, Clonazepam bid     Hx of Seizure, on Dilantin '100mg'$  am '200mg'$  hs, no active seizure activity since last visited, her mood is managed with Celexa '10mg'$ , clonazepam bid, hypothyroidism, on Levothyroxine 166mg, last TSH 09/27/15 TSH 1.96. Dementia, resides at AL, takign Aricept and Namenda, forgetful. Hx of PE, off  Eliquis. Lower back pain, taking Mobic 7.'5mg'$ , Tylenol '325mg'$  bid  Past Medical History:  Diagnosis Date  . Anemia, unspecified 03/18/2011  . Anxiety state, unspecified 01/2011  . Blood in stool 06/15/2012  . Corns and callosities 07/01/2011  . Diverticulosis 02/2011  . Dizziness   . DJD (degenerative joint disease)   . Dysuria 06/17/2011  . Hemorrhoids 02/2011  . Low back pain   . Mitral valve problem thickened   thickened  . Osteoarthritis of both hands 10/09/2015  . Osteoporosis 02/2011  . Other abnormal blood chemistry 0/30/2012  . Other acquired deformity of toe 12/16/2011  . Pain in joint, site unspecified 01/2012  . 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 Sacroiliitis, not elsewhere classified ([place] 05/2011  . Seizures ([place]    Remotely  . Unspecified hypothyroidism 01/2011  . 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 Vitamin D deficiency 01/2011   Past Surgical History:  Procedure Laterality Date  . bletheroplasty    . CATARACT EXTRACTION W/ INTRAOCULAR LENS  IMPLANT, BILATERAL  2010  . CERVICAL LAMINECTOMY  2005  . HAMMER TOE SURGERY  2013   right 2nd toe Dr. HMallie Mussel . JOINT REPLACEMENT Bilateral 2002 Right, 1992 Left   knees    Allergies  Allergen Reactions  . Macrodantin [Nitrofurantoin]   . Morphine And Related Other (See Comments)    Altered mental state   . Sulfa Antibiotics Other (See Comments)    Reaction: unknown      Medication List       Accurate as of 04/18/16 11:59 PM. Always  use your most recent med list.          acetaminophen 325 MG tablet Commonly known as:  TYLENOL Take 325 mg by mouth. Take 2 tablets twice daily   amoxicillin 500 MG capsule Commonly known as:  AMOXIL Take 2,000 mg by mouth as directed. 1 Hour Before Procedure.   BREO ELLIPTA 100-25 MCG/INH Aepb Generic drug:  fluticasone furoate-vilanterol Inhale 1 puff into the lungs daily.   CALCIUM 600+D 600-400 MG-UNIT  tablet Generic drug:  Calcium Carbonate-Vitamin D Take 1 tablet by mouth daily.   citalopram 10 MG tablet Commonly known as:  CELEXA One daily to treat depression and anxiety   clonazePAM 0.5 MG tablet Commonly known as:  KLONOPIN Take 1 tablet (0.5 mg total) by mouth 2 (two) times daily as needed (anxiety).   donepezil 10 MG tablet Commonly known as:  ARICEPT Take 10 mg by mouth daily.   HYDROcodone-acetaminophen 5-325 MG tablet Commonly known as:  NORCO/VICODIN Take 1-2 tablets by mouth every 12 (twelve) hours as needed for moderate pain.   levothyroxine 100 MCG tablet Commonly known as:  SYNTHROID, LEVOTHROID Take 1 tablet (100 mcg total) by mouth daily before breakfast. For thyroid   meloxicam 7.5 MG tablet Commonly known as:  MOBIC Take 7.5 mg by mouth daily.   memantine 10 MG tablet Commonly known as:  NAMENDA Take 10 mg by mouth 2 (two) times daily.   Omega-3 1000 MG Caps Take 1 capsule by mouth daily. Take one capsule every morning   phenytoin 100 MG ER capsule Commonly known as:  DILANTIN Take 100-200 mg by mouth 2 (two) times daily. Take one capsule every morning; take 2 capsules at bedtime   TAB-A-VITE PO Take 1 tablet by mouth daily.   triamcinolone cream 0.1 % Commonly known as:  KENALOG   Vitamin D3 5000 units Tabs Take 1 tablet by mouth daily. Take one tablet daily       Review of Systems  Constitutional: Negative for diaphoresis.  HENT: Positive for hearing loss. Negative for congestion, ear discharge, ear pain, nosebleeds, sore throat and tinnitus.        Bilateral hearing loss. June 2015 ruptured right Tm with drainage, but little pain.  Eyes: Negative.   Respiratory: Positive for shortness of breath.        History of dyspnea on exertion. Remains on oxygen 24/7 at this time. History of pulmonary embolism and [place] filter. Remains on warfarin.  Cardiovascular: Negative.   Gastrointestinal: Negative.   Endocrine: Negative for  polydipsia.  Genitourinary: Positive for frequency.       2-3x/night  Musculoskeletal: Positive for arthralgias (both hands), back pain and gait problem (unstable using walker).       Osteoarthritis with complaints of pain in multiple areas. Neck and right shoulder pains.  [place] 05/20/15 C/o back pain C/o right hip pain  Skin: Negative for rash (on neck).       Senile ecchymoses.   Allergic/Immunologic: Negative for environmental allergies.  Neurological: Positive for weakness. Negative for dizziness, tremors and seizures.       Hx seizures.  Head trauma sustained in a fall on 10/05/27/15.  Psychiatric/Behavioral: Positive for confusion, decreased concentration and dysphoric mood. Negative for suicidal ideas. The patient is nervous/anxious.        Chronic anxiety. Memory deficits.    Immunization History  Administered Date(s) Administered  . Influenza Whole 03/17/2007, 02/16/2009, 01/30/2010, 02/16/2013  . Influenza-Unspecified 03/02/2014, 02/22/2015, 02/28/2016  . PPD Test 07/09/2010  .  Pneumococcal Polysaccharide-23 05/19/2005, 05/23/2013  . Td 05/19/2005  . Tetanus 10/06/2012   Pertinent  Health Maintenance Due  Topic Date Due  . DEXA SCAN  11/19/1994  . PNA vac Low Risk Adult (2 of 2 - PCV13) 05/23/2014  . INFLUENZA VACCINE  Completed   Fall Risk  10/09/2015 07/10/2015 03/26/2015 12/19/2014 03/22/2013  Falls in the past year? No Yes Yes Yes Yes  Number falls in past yr: - '1 1 1 2 '$ or more  Injury with Fall? - Yes No Yes -   Functional Status Survey:    Vitals:   04/18/16 1121  BP: 91/67  Pulse: 74  Resp: 20  Temp: 98.7 F (37.1 C)  Weight: 139 lb 3.2 oz (63.1 kg)  Height: 5' (1.524 m)   Body mass index is 27.19 kg/m. Physical Exam  Constitutional: She is oriented to person, place, and time. She appears well-developed and well-nourished. No distress.  HENT:  Bilateral hearing loss. Rupture of the right TM. Torus palatinus.   Eyes: Conjunctivae and EOM are normal.  Pupils are equal, round, and reactive to light.  Lens implants in both eyes  Neck: Normal range of motion. Neck supple. No JVD present. No tracheal deviation present. No thyromegaly present.  Cardiovascular: Normal rate, regular rhythm, normal heart sounds and intact distal pulses.  Exam reveals no gallop and no friction rub.   No murmur heard. Pulmonary/Chest: Effort normal and breath sounds normal. No respiratory distress. She has no wheezes. She exhibits no tenderness.  Abdominal: Soft. Bowel sounds are normal. She exhibits no distension and no mass. There is no tenderness.  Genitourinary: Rectal exam shows guaiac negative stool.  Genitourinary Comments: From previous exam: Hemorrhoids. Normal sphincter tone. Stools are normal brown color.  Musculoskeletal: Normal range of motion. She exhibits tenderness and deformity. She exhibits no edema.  Tender in the posterior neck. Mild restriction in movements. Hammertoes at the left second and third. Right second toe overlaps the great toe. SP surgical repair of the right 2nd toe.  Shoulders sore. Painful in the right shoulder when she lifts overhead and rotates at the shpoulder.[facility] July 2014. Pain in both hands and significant deformities related to OA.  Unstable gait. Using walker with 2 front wheels and rear skids. Weaker grip of the right hand.. Tender in the left SI joint. Tender in all enlarged finger joints.  Pain in the right hip, X-ray 03/10/16 no acute fx  Lymphadenopathy:    She has no cervical adenopathy.  Neurological: She is alert and oriented to person, place, and time. No cranial nerve deficit. Coordination normal.  No loss of grip strength. Loss of memory. 04/04/14 MMSE 25/30. Passed clock drawing. 07/04/14 MMSE 28/30. Failed clock drawing. Bilateral tremor.  Skin: Skin is warm and dry. No rash (erythematous blotches on the neck anteriorly) noted. No erythema. No pallor.     Psychiatric: Thought content normal.  depressed     Labs reviewed:  Recent Labs  09/27/15 12/13/15 03/05/16 1730 03/30/16 2033  NA 141 142 140 139  K 4.2 4.4 4.8 4.6  CL  --   --  105 105  CO2  --   --  29  --   GLUCOSE  --   --  99 89  BUN 29* 20 31* 33*  CREATININE 0.6  --  0.95 0.90  CALCIUM  --   --  8.7*  --     Recent Labs  09/27/15 03/05/16 1730  AST 18 27  ALT 13 19  ALKPHOS 84 82  BILITOT  --  0.5  PROT  --  6.4*  ALBUMIN  --  3.5    Recent Labs  12/13/15 03/05/16 1730 03/30/16 2033  WBC 6.1 5.9  --   NEUTROABS  --  3.9  --   HGB 14.1 12.6 13.3  HCT  --  39.4 39.0  MCV  --  99.5  --   PLT 190 173  --    Lab Results  Component Value Date   TSH 1.96 09/27/2015   Lab Results  Component Value Date   HGBA1C 5.4 03/08/2015   Lab Results  Component Value Date   CHOL 235 (H) 03/06/2009   HDL 108.40 03/06/2009   LDLDIRECT 108.2 03/06/2009    Significant Diagnostic Results in last 30 days:  Ct Head Wo Contrast  Result Date: 03/30/2016 CLINICAL DATA:  [place] tonight. EXAM: CT HEAD WITHOUT CONTRAST CT MAXILLOFACIAL WITHOUT CONTRAST CT CERVICAL SPINE WITHOUT CONTRAST TECHNIQUE: Multidetector CT imaging of the head, cervical spine, and maxillofacial structures were performed using the standard protocol without intravenous contrast. Multiplanar CT image reconstructions of the cervical spine and maxillofacial structures were also generated. COMPARISON:  03/05/2016 FINDINGS: CT HEAD FINDINGS Brain: No evidence of acute infarction, hemorrhage, hydrocephalus, extra-axial collection or mass lesion/mass effect. Mild generalized atrophy. Mild white matter hypodensity consistent with small vessel ischemic disease. Stable encephalomalacia due to remote left occipital infarction. Vascular: Atherosclerotic calcification of the siphonous portions of the intracranial ICA bilaterally, unchanged. No acute vascular findings. Skull: Normal. Negative for fracture or focal lesion. Other: Frontal scalp hematoma, right greater than  left. CT MAXILLOFACIAL FINDINGS Osseous: No fracture or mandibular dislocation. No destructive process. Orbits: Negative. No traumatic or inflammatory finding. Sinuses: Clear. Soft tissues: Negative. CT CERVICAL SPINE FINDINGS The vertebral column, pedicles and facet articulations are intact. There is no evidence of acute fracture. No acute soft tissue abnormalities are evident. There is mild degenerative spondylolisthesis at multiple levels. Moderately severe degenerative cervical disc disease is present, as well as moderate cervical facet arthritis. This is unchanged. Right apical lung opacity, incompletely imaged and indeterminate although the visible portion appears to be unchanged from 04/15/2013. IMPRESSION: 1. No acute intracranial findings. There is moderate generalized atrophy and chronic appearing white matter hypodensities which likely represent small vessel ischemic disease. Remote left occipital infarction with stable encephalomalacia. 2. Negative for acute maxillofacial fracture 3. Negative for acute cervical spine fracture Electronically Signed   By: Andreas Newport M.D.   On: 03/30/2016 22:06   Ct Cervical Spine Wo Contrast  Result Date: 03/30/2016 CLINICAL DATA:  [place] tonight. EXAM: CT HEAD WITHOUT CONTRAST CT MAXILLOFACIAL WITHOUT CONTRAST CT CERVICAL SPINE WITHOUT CONTRAST TECHNIQUE: Multidetector CT imaging of the head, cervical spine, and maxillofacial structures were performed using the standard protocol without intravenous contrast. Multiplanar CT image reconstructions of the cervical spine and maxillofacial structures were also generated. COMPARISON:  03/05/2016 FINDINGS: CT HEAD FINDINGS Brain: No evidence of acute infarction, hemorrhage, hydrocephalus, extra-axial collection or mass lesion/mass effect. Mild generalized atrophy. Mild white matter hypodensity consistent with small vessel ischemic disease. Stable encephalomalacia due to remote left occipital infarction. Vascular:  Atherosclerotic calcification of the siphonous portions of the intracranial ICA bilaterally, unchanged. No acute vascular findings. Skull: Normal. Negative for fracture or focal lesion. Other: Frontal scalp hematoma, right greater than left. CT MAXILLOFACIAL FINDINGS Osseous: No fracture or mandibular dislocation. No destructive process. Orbits: Negative. No traumatic or inflammatory finding. Sinuses: Clear. Soft tissues: Negative. CT CERVICAL SPINE FINDINGS The vertebral column,  pedicles and facet articulations are intact. There is no evidence of acute fracture. No acute soft tissue abnormalities are evident. There is mild degenerative spondylolisthesis at multiple levels. Moderately severe degenerative cervical disc disease is present, as well as moderate cervical facet arthritis. This is unchanged. Right apical lung opacity, incompletely imaged and indeterminate although the visible portion appears to be unchanged from 04/15/2013. IMPRESSION: 1. No acute intracranial findings. There is moderate generalized atrophy and chronic appearing white matter hypodensities which likely represent small vessel ischemic disease. Remote left occipital infarction with stable encephalomalacia. 2. Negative for acute maxillofacial fracture 3. Negative for acute cervical spine fracture Electronically Signed   By: Andreas Newport M.D.   On: 03/30/2016 22:06   Ct Maxillofacial Wo Contrast  Result Date: 03/30/2016 CLINICAL DATA:  [place] tonight. EXAM: CT HEAD WITHOUT CONTRAST CT MAXILLOFACIAL WITHOUT CONTRAST CT CERVICAL SPINE WITHOUT CONTRAST TECHNIQUE: Multidetector CT imaging of the head, cervical spine, and maxillofacial structures were performed using the standard protocol without intravenous contrast. Multiplanar CT image reconstructions of the cervical spine and maxillofacial structures were also generated. COMPARISON:  03/05/2016 FINDINGS: CT HEAD FINDINGS Brain: No evidence of acute infarction, hemorrhage, hydrocephalus,  extra-axial collection or mass lesion/mass effect. Mild generalized atrophy. Mild white matter hypodensity consistent with small vessel ischemic disease. Stable encephalomalacia due to remote left occipital infarction. Vascular: Atherosclerotic calcification of the siphonous portions of the intracranial ICA bilaterally, unchanged. No acute vascular findings. Skull: Normal. Negative for fracture or focal lesion. Other: Frontal scalp hematoma, right greater than left. CT MAXILLOFACIAL FINDINGS Osseous: No fracture or mandibular dislocation. No destructive process. Orbits: Negative. No traumatic or inflammatory finding. Sinuses: Clear. Soft tissues: Negative. CT CERVICAL SPINE FINDINGS The vertebral column, pedicles and facet articulations are intact. There is no evidence of acute fracture. No acute soft tissue abnormalities are evident. There is mild degenerative spondylolisthesis at multiple levels. Moderately severe degenerative cervical disc disease is present, as well as moderate cervical facet arthritis. This is unchanged. Right apical lung opacity, incompletely imaged and indeterminate although the visible portion appears to be unchanged from 04/15/2013. IMPRESSION: 1. No acute intracranial findings. There is moderate generalized atrophy and chronic appearing white matter hypodensities which likely represent small vessel ischemic disease. Remote left occipital infarction with stable encephalomalacia. 2. Negative for acute maxillofacial fracture 3. Negative for acute cervical spine fracture Electronically Signed   By: Andreas Newport M.D.   On: 03/30/2016 22:06    Assessment/Plan Depression continue Celexa '10mg'$  daily, Clonazepam 0.'5mg'$  bid, observe.     Torus palatinus Chronic.  Chronic pulmonary embolism (HCC) Off anticoagulation therapy.   Hypothyroidism Continue Levothyroxine 155mg, 09/27/15 TSH 1.96    Seizure disorder ([place] Last seizure 2010 09/27/15 Dilantin lever 11(10-20) Continue  Dilantin '100mg'$  am, '200mg'$  hs    Memory impairment- SNF/ assited living pt 05/19/14 MMSE 27/30, continue Aricept and Namenda    Back pain livable, continue Mobic 7.'5mg'$  daily, increase Tylenol '650mg'$  to bid, may  lumbar spine/sacrum  X-ray if worsens.      Family/ staff Communication: [facility]  Labs/tests ordered:  none

## 2016-04-18 NOTE — Assessment & Plan Note (Signed)
Continue Levothyroxine 165mg, 09/27/15 TSH 1.96

## 2016-04-18 NOTE — Assessment & Plan Note (Signed)
Chronic. 

## 2016-04-18 NOTE — Assessment & Plan Note (Signed)
livable, continue Mobic 7.'5mg'$  daily, increase Tylenol '650mg'$  to bid, may  lumbar spine/sacrum  X-ray if worsens.

## 2016-04-21 DIAGNOSIS — R41841 Cognitive communication deficit: Secondary | ICD-10-CM | POA: Diagnosis not present

## 2016-04-21 DIAGNOSIS — M6281 Muscle weakness (generalized): Secondary | ICD-10-CM | POA: Diagnosis not present

## 2016-04-21 DIAGNOSIS — R2681 Unsteadiness on feet: Secondary | ICD-10-CM | POA: Diagnosis not present

## 2016-04-21 DIAGNOSIS — M25552 Pain in left hip: Secondary | ICD-10-CM | POA: Diagnosis not present

## 2016-04-21 DIAGNOSIS — R296 Repeated falls: Secondary | ICD-10-CM | POA: Diagnosis not present

## 2016-04-22 DIAGNOSIS — R296 Repeated falls: Secondary | ICD-10-CM | POA: Diagnosis not present

## 2016-04-22 DIAGNOSIS — M25552 Pain in left hip: Secondary | ICD-10-CM | POA: Diagnosis not present

## 2016-04-22 DIAGNOSIS — R41841 Cognitive communication deficit: Secondary | ICD-10-CM | POA: Diagnosis not present

## 2016-04-22 DIAGNOSIS — R2681 Unsteadiness on feet: Secondary | ICD-10-CM | POA: Diagnosis not present

## 2016-04-22 DIAGNOSIS — M6281 Muscle weakness (generalized): Secondary | ICD-10-CM | POA: Diagnosis not present

## 2016-04-24 DIAGNOSIS — R296 Repeated falls: Secondary | ICD-10-CM | POA: Diagnosis not present

## 2016-04-24 DIAGNOSIS — M6281 Muscle weakness (generalized): Secondary | ICD-10-CM | POA: Diagnosis not present

## 2016-04-24 DIAGNOSIS — R41841 Cognitive communication deficit: Secondary | ICD-10-CM | POA: Diagnosis not present

## 2016-04-24 DIAGNOSIS — R2681 Unsteadiness on feet: Secondary | ICD-10-CM | POA: Diagnosis not present

## 2016-04-24 DIAGNOSIS — M25552 Pain in left hip: Secondary | ICD-10-CM | POA: Diagnosis not present

## 2016-04-28 DIAGNOSIS — M6281 Muscle weakness (generalized): Secondary | ICD-10-CM | POA: Diagnosis not present

## 2016-04-28 DIAGNOSIS — R2681 Unsteadiness on feet: Secondary | ICD-10-CM | POA: Diagnosis not present

## 2016-04-28 DIAGNOSIS — R296 Repeated falls: Secondary | ICD-10-CM | POA: Diagnosis not present

## 2016-04-28 DIAGNOSIS — R41841 Cognitive communication deficit: Secondary | ICD-10-CM | POA: Diagnosis not present

## 2016-04-28 DIAGNOSIS — M25552 Pain in left hip: Secondary | ICD-10-CM | POA: Diagnosis not present

## 2016-04-30 DIAGNOSIS — M25552 Pain in left hip: Secondary | ICD-10-CM | POA: Diagnosis not present

## 2016-04-30 DIAGNOSIS — R2681 Unsteadiness on feet: Secondary | ICD-10-CM | POA: Diagnosis not present

## 2016-04-30 DIAGNOSIS — R296 Repeated falls: Secondary | ICD-10-CM | POA: Diagnosis not present

## 2016-04-30 DIAGNOSIS — M6281 Muscle weakness (generalized): Secondary | ICD-10-CM | POA: Diagnosis not present

## 2016-04-30 DIAGNOSIS — R41841 Cognitive communication deficit: Secondary | ICD-10-CM | POA: Diagnosis not present

## 2016-05-02 ENCOUNTER — Other Ambulatory Visit: Payer: Self-pay

## 2016-05-02 DIAGNOSIS — M25552 Pain in left hip: Secondary | ICD-10-CM | POA: Diagnosis not present

## 2016-05-02 DIAGNOSIS — M6281 Muscle weakness (generalized): Secondary | ICD-10-CM | POA: Diagnosis not present

## 2016-05-02 DIAGNOSIS — R41841 Cognitive communication deficit: Secondary | ICD-10-CM | POA: Diagnosis not present

## 2016-05-02 DIAGNOSIS — R296 Repeated falls: Secondary | ICD-10-CM | POA: Diagnosis not present

## 2016-05-02 DIAGNOSIS — R2681 Unsteadiness on feet: Secondary | ICD-10-CM | POA: Diagnosis not present

## 2016-05-02 MED ORDER — CLONAZEPAM 0.5 MG PO TABS: ORAL_TABLET | ORAL | 0 refills | Status: DC

## 2016-05-02 NOTE — Telephone Encounter (Signed)
A fax was received from [facility] for a prescription refill on clonazepam 0.5 mg tablets. Prescription was printed and placed in Dr. Vale Haven folder for signing. Please fax signed prescription to [phone].

## 2016-05-06 DIAGNOSIS — M25552 Pain in left hip: Secondary | ICD-10-CM | POA: Diagnosis not present

## 2016-05-06 DIAGNOSIS — R296 Repeated falls: Secondary | ICD-10-CM | POA: Diagnosis not present

## 2016-05-06 DIAGNOSIS — M6281 Muscle weakness (generalized): Secondary | ICD-10-CM | POA: Diagnosis not present

## 2016-05-06 DIAGNOSIS — R41841 Cognitive communication deficit: Secondary | ICD-10-CM | POA: Diagnosis not present

## 2016-05-06 DIAGNOSIS — R2681 Unsteadiness on feet: Secondary | ICD-10-CM | POA: Diagnosis not present

## 2016-05-08 DIAGNOSIS — R296 Repeated falls: Secondary | ICD-10-CM | POA: Diagnosis not present

## 2016-05-08 DIAGNOSIS — M25552 Pain in left hip: Secondary | ICD-10-CM | POA: Diagnosis not present

## 2016-05-08 DIAGNOSIS — R2681 Unsteadiness on feet: Secondary | ICD-10-CM | POA: Diagnosis not present

## 2016-05-08 DIAGNOSIS — M6281 Muscle weakness (generalized): Secondary | ICD-10-CM | POA: Diagnosis not present

## 2016-05-08 DIAGNOSIS — R41841 Cognitive communication deficit: Secondary | ICD-10-CM | POA: Diagnosis not present

## 2016-05-10 DIAGNOSIS — N390 Urinary tract infection, site not specified: Secondary | ICD-10-CM | POA: Diagnosis not present

## 2016-05-13 DIAGNOSIS — R2681 Unsteadiness on feet: Secondary | ICD-10-CM | POA: Diagnosis not present

## 2016-05-13 DIAGNOSIS — M6281 Muscle weakness (generalized): Secondary | ICD-10-CM | POA: Diagnosis not present

## 2016-05-13 DIAGNOSIS — M25552 Pain in left hip: Secondary | ICD-10-CM | POA: Diagnosis not present

## 2016-05-13 DIAGNOSIS — R296 Repeated falls: Secondary | ICD-10-CM | POA: Diagnosis not present

## 2016-05-13 DIAGNOSIS — R41841 Cognitive communication deficit: Secondary | ICD-10-CM | POA: Diagnosis not present

## 2016-05-16 DIAGNOSIS — R2681 Unsteadiness on feet: Secondary | ICD-10-CM | POA: Diagnosis not present

## 2016-05-16 DIAGNOSIS — R296 Repeated falls: Secondary | ICD-10-CM | POA: Diagnosis not present

## 2016-05-16 DIAGNOSIS — R41841 Cognitive communication deficit: Secondary | ICD-10-CM | POA: Diagnosis not present

## 2016-05-16 DIAGNOSIS — M6281 Muscle weakness (generalized): Secondary | ICD-10-CM | POA: Diagnosis not present

## 2016-05-16 DIAGNOSIS — M25552 Pain in left hip: Secondary | ICD-10-CM | POA: Diagnosis not present

## 2016-05-20 ENCOUNTER — Encounter: Payer: Self-pay | Admitting: Nurse Practitioner

## 2016-05-20 ENCOUNTER — Non-Acute Institutional Stay: Payer: Medicare Other | Admitting: Nurse Practitioner

## 2016-05-20 DIAGNOSIS — G40909 Epilepsy, unspecified, not intractable, without status epilepticus: Secondary | ICD-10-CM | POA: Diagnosis not present

## 2016-05-20 DIAGNOSIS — K219 Gastro-esophageal reflux disease without esophagitis: Secondary | ICD-10-CM

## 2016-05-20 DIAGNOSIS — F320 Major depressive disorder, single episode, mild: Secondary | ICD-10-CM

## 2016-05-20 DIAGNOSIS — J069 Acute upper respiratory infection, unspecified: Secondary | ICD-10-CM | POA: Diagnosis not present

## 2016-05-20 DIAGNOSIS — M6281 Muscle weakness (generalized): Secondary | ICD-10-CM | POA: Diagnosis not present

## 2016-05-20 DIAGNOSIS — I2601 Septic pulmonary embolism with acute cor pulmonale: Secondary | ICD-10-CM | POA: Diagnosis not present

## 2016-05-20 DIAGNOSIS — R413 Other amnesia: Secondary | ICD-10-CM

## 2016-05-20 DIAGNOSIS — M5440 Lumbago with sciatica, unspecified side: Secondary | ICD-10-CM | POA: Diagnosis not present

## 2016-05-20 DIAGNOSIS — R2681 Unsteadiness on feet: Secondary | ICD-10-CM | POA: Diagnosis not present

## 2016-05-20 DIAGNOSIS — E039 Hypothyroidism, unspecified: Secondary | ICD-10-CM | POA: Diagnosis not present

## 2016-05-20 DIAGNOSIS — R296 Repeated falls: Secondary | ICD-10-CM | POA: Diagnosis not present

## 2016-05-20 DIAGNOSIS — I2782 Chronic pulmonary embolism: Secondary | ICD-10-CM

## 2016-05-20 DIAGNOSIS — R41841 Cognitive communication deficit: Secondary | ICD-10-CM | POA: Diagnosis not present

## 2016-05-20 DIAGNOSIS — M25552 Pain in left hip: Secondary | ICD-10-CM | POA: Diagnosis not present

## 2016-05-20 NOTE — Assessment & Plan Note (Signed)
URI vs GERD, Z-pk, Mucinex, Omeprazole. Observe the patient.

## 2016-05-20 NOTE — Assessment & Plan Note (Signed)
livable, continue Mobic 7.'5mg'$  daily, Tylenol '650mg'$  bid, may  lumbar spine/sacrum  X-ray if worsens.

## 2016-05-20 NOTE — Assessment & Plan Note (Signed)
Hx of it, off Eliquis.

## 2016-05-20 NOTE — Assessment & Plan Note (Signed)
Continue Levothyroxine 177mg, 09/27/15 TSH 1.96

## 2016-05-20 NOTE — Progress Notes (Signed)
Location:  [facility] Room Number: 36 Place of Service:  ALF [phone]) Provider:  Danielys Madry, Manxie  NP  Jeanmarie Hubert, MD  Patient Care Team: Estill Dooms, MD as PCP - General (Internal Medicine) [facility] [name], MD as Consulting Physician (Orthopedic Surgery) [name], MD as Consulting Physician (Physical Medicine and Rehabilitation) Melina Schools, MD as Consulting Physician (Orthopedic Surgery) [name], MD as Consulting Physician (Otolaryngology) Kynesha Guerin Otho Darner, NP as Nurse Practitioner (Internal Medicine)  Extended Emergency Contact Information Primary Emergency Contact: [name],[name] Address: [address], [place] Phone: [phone] Mobile Phone: [phone] Relation: Son Secondary Emergency Contact: [name] [place] Phone: [phone] Mobile Phone: [phone] Relation: Daughter  Code Status:  DNR Goals of care: Advanced Directive information Advanced Directives 05/20/2016  Does Patient Have a Medical Advance Directive? Yes  Type of Paramedic of [place];Living will  Does patient want to make changes to medical advance directive? No - Patient declined  Copy of [place] in Chart? Yes  Pre-existing out of facility DNR order (yellow form or pink MOST form) -     Chief Complaint  Patient presents with  . Acute Visit    cold- coughing,runny nose    HPI:  Pt is a 83 y.o. female seen today for an acute visit for non productive cough, worse at night, no O2 desaturation, denied chest pain or SOB, she is afebrile.   Hx of Seizure, on Dilantin '100mg'$  am '200mg'$  hs, no active seizure activity since last visited, her mood is managed with Celexa '10mg'$ , clonazepam bid, hypothyroidism, on Levothyroxine 146mg, last TSH 09/27/15 TSH 1.96. Dementia, resides at AL, takign Aricept and Namenda, forgetful. Hx of PE, off Eliquis. Lower  back pain, taking Mobic 7.'5mg'$ , Tylenol '325mg'$  bid   Past Medical History:  Diagnosis Date  . Anemia, unspecified 03/18/2011  . Anxiety state, unspecified 01/2011  . Blood in stool 06/15/2012  . Corns and callosities 07/01/2011  . Diverticulosis 02/2011  . Dizziness   . DJD (degenerative joint disease)   . Dysuria 06/17/2011  . Hemorrhoids 02/2011  . Low back pain   . Mitral valve problem thickened   thickened  . Osteoarthritis of both hands 10/09/2015  . Osteoporosis 02/2011  . Other abnormal blood chemistry 0/30/2012  . Other acquired deformity of toe 12/16/2011  . Pain in joint, site unspecified 01/2012  . 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 Sac[place]itis, not elsewhere classified ([place] 05/2011  . Seizures ([place]    Remotely  . Unspecified hypothyroidism 01/2011  . 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 Vitamin D deficiency 01/2011   Past Surgical History:  Procedure Laterality Date  . bletheroplasty    . CATARACT EXTRACTION W/ INTRAOCULAR LENS  IMPLANT, BILATERAL  2010  . CERVICAL LAMINECTOMY  2005  . HAMMER TOE SURGERY  2013   right 2nd toe Dr. HMallie Mussel . JOINT REPLACEMENT Bilateral 2002 Right, 1992 Left   knees    Allergies  Allergen Reactions  . Macrodantin [Nitrofurantoin]   . Morphine And Related Other (See Comments)    Altered mental state   . Sulfa Antibiotics Other (See Comments)    Reaction: unknown    Allergies as of 05/20/2016      Reactions   Macrodantin [nitrofurantoin]    Morphine And Related Other (See Comments)  Altered mental state    Sulfa Antibiotics Other (See Comments)   Reaction: unknown      Medication List       Accurate as of 05/20/16  3:31 PM. Always use your most recent med list.          acetaminophen 325 MG tablet Commonly known as:  TYLENOL Take 325 mg by mouth. Take 2 tablets twice daily   amoxicillin 500 MG capsule Commonly known as:  AMOXIL Take 2,000 mg by mouth as  directed. 1 Hour Before Procedure.   BREO ELLIPTA 100-25 MCG/INH Aepb Generic drug:  fluticasone furoate-vilanterol Inhale 1 puff into the lungs daily.   CALCIUM 600+D 600-400 MG-UNIT tablet Generic drug:  Calcium Carbonate-Vitamin D Take 1 tablet by mouth daily.   citalopram 10 MG tablet Commonly known as:  CELEXA One daily to treat depression and anxiety   clonazePAM 0.5 MG tablet Commonly known as:  KLONOPIN Take one tablet by mouth 2 times a day at 8 am and 8 pm.   donepezil 10 MG tablet Commonly known as:  ARICEPT Take 10 mg by mouth daily.   HYDROcodone-acetaminophen 5-325 MG tablet Commonly known as:  NORCO/VICODIN Take 1-2 tablets by mouth every 12 (twelve) hours as needed for moderate pain.   levothyroxine 100 MCG tablet Commonly known as:  SYNTHROID, LEVOTHROID Take 1 tablet (100 mcg total) by mouth daily before breakfast. For thyroid   meloxicam 7.5 MG tablet Commonly known as:  MOBIC Take 7.5 mg by mouth daily.   memantine 10 MG tablet Commonly known as:  NAMENDA Take 10 mg by mouth 2 (two) times daily.   Omega-3 1000 MG Caps Take 1 capsule by mouth daily. Take one capsule every morning   phenytoin 100 MG ER capsule Commonly known as:  DILANTIN Take 100-200 mg by mouth 2 (two) times daily. Take one capsule every morning; take 2 capsules at bedtime   TAB-A-VITE PO Take 1 tablet by mouth daily.   triamcinolone cream 0.1 % Commonly known as:  KENALOG   Vitamin D3 5000 units Tabs Take 1 tablet by mouth daily. Take one tablet daily       Review of Systems  Constitutional: Negative for diaphoresis.  HENT: Positive for hearing loss. Negative for congestion, ear discharge, ear pain, nosebleeds, sore throat and tinnitus.        Bilateral hearing loss. June 2015 ruptured right Tm with drainage, but little pain.  Eyes: Negative.   Respiratory: Positive for cough. Negative for shortness of breath.        History of dyspnea on exertion. Remains on  oxygen 24/7 at this time. History of pulmonary embolism and [place] filter. Remains on warfarin.  Cardiovascular: Negative.   Gastrointestinal: Negative.   Endocrine: Negative for polydipsia.  Genitourinary: Positive for frequency.       2-3x/night  Musculoskeletal: Positive for arthralgias (both hands), back pain and gait problem (unstable using walker).       Osteoarthritis with complaints of pain in multiple areas. Neck and right shoulder pains.  [place] 05/20/15 C/o back pain C/o right hip pain  Skin: Negative for rash (on neck).       Senile ecchymoses.   Allergic/Immunologic: Negative for environmental allergies.  Neurological: Positive for weakness. Negative for dizziness, tremors and seizures.       Hx seizures.  Head trauma sustained in a fall on 10/05/27/15.  Psychiatric/Behavioral: Positive for confusion, decreased concentration and dysphoric mood. Negative for suicidal ideas. The patient is nervous/anxious.  Chronic anxiety. Memory deficits.    Immunization History  Administered Date(s) Administered  . Influenza Whole 03/17/2007, 02/16/2009, 01/30/2010, 02/16/2013  . Influenza-Unspecified 03/02/2014, 02/22/2015, 02/28/2016  . PPD Test 07/09/2010  . Pneumococcal Polysaccharide-23 05/19/2005, 05/23/2013  . Td 05/19/2005  . Tetanus 10/06/2012   Pertinent  Health Maintenance Due  Topic Date Due  . DEXA SCAN  11/19/1994  . PNA vac Low Risk Adult (2 of 2 - PCV13) 05/23/2014  . INFLUENZA VACCINE  Completed   Fall Risk  10/09/2015 07/10/2015 03/26/2015 12/19/2014 03/22/2013  Falls in the past year? No Yes Yes Yes Yes  Number falls in past yr: - '1 1 1 2 '$ or more  Injury with Fall? - Yes No Yes -   Functional Status Survey:    Vitals:   05/20/16 1312  BP: 112/72  Pulse: 63  Resp: 16  Temp: 98.8 F (37.1 C)  SpO2: 92%  Weight: 140 lb 12.8 oz (63.9 kg)  Height: 5' (1.524 m)   Body mass index is 27.5 kg/m. Physical Exam  Constitutional: She is oriented to person,  place, and time. She appears well-developed and well-nourished. No distress.  HENT:  Bilateral hearing loss. Rupture of the right TM. Torus palatinus.   Eyes: Conjunctivae and EOM are normal. Pupils are equal, round, and reactive to light.  Lens implants in both eyes  Neck: Normal range of motion. Neck supple. No JVD present. No tracheal deviation present. No thyromegaly present.  Cardiovascular: Normal rate, regular rhythm, normal heart sounds and intact distal pulses.  Exam reveals no gallop and no friction rub.   No murmur heard. Pulmonary/Chest: Effort normal and breath sounds normal. No respiratory distress. She has no wheezes. She exhibits no tenderness.  Increased breath sounds posterior lungs.   Abdominal: Soft. Bowel sounds are normal. She exhibits no distension and no mass. There is no tenderness.  Genitourinary: Rectal exam shows guaiac negative stool.  Genitourinary Comments: From previous exam: Hemorrhoids. Normal sphincter tone. Stools are normal brown color.  Musculoskeletal: Normal range of motion. She exhibits tenderness and deformity. She exhibits no edema.  Tender in the posterior neck. Mild restriction in movements. Hammertoes at the left second and third. Right second toe overlaps the great toe. SP surgical repair of the right 2nd toe.  Shoulders sore. Painful in the right shoulder when she lifts overhead and rotates at the shpoulder.[facility] July 2014. Pain in both hands and significant deformities related to OA.  Unstable gait. Using walker with 2 front wheels and rear skids. Weaker grip of the right hand.. Tender in the left SI joint. Tender in all enlarged finger joints.  Pain in the right hip, X-ray 03/10/16 no acute fx  Lymphadenopathy:    She has no cervical adenopathy.  Neurological: She is alert and oriented to person, place, and time. No cranial nerve deficit. Coordination normal.  No loss of grip strength. Loss of memory. 04/04/14 MMSE 25/30. Passed clock  drawing. 07/04/14 MMSE 28/30. Failed clock drawing. Bilateral tremor.  Skin: Skin is warm and dry. No rash (erythematous blotches on the neck anteriorly) noted. No erythema. No pallor.     Psychiatric: Thought content normal.  depressed    Labs reviewed:  Recent Labs  09/27/15 12/13/15 03/05/16 1730 03/30/16 2033  NA 141 142 140 139  K 4.2 4.4 4.8 4.6  CL  --   --  105 105  CO2  --   --  29  --   GLUCOSE  --   --  99  89  BUN 29* 20 31* 33*  CREATININE 0.6  --  0.95 0.90  CALCIUM  --   --  8.7*  --     Recent Labs  09/27/15 03/05/16 1730  AST 18 27  ALT 13 19  ALKPHOS 84 82  BILITOT  --  0.5  PROT  --  6.4*  ALBUMIN  --  3.5    Recent Labs  12/13/15 03/05/16 1730 03/30/16 2033  WBC 6.1 5.9  --   NEUTROABS  --  3.9  --   HGB 14.1 12.6 13.3  HCT  --  39.4 39.0  MCV  --  99.5  --   PLT 190 173  --    Lab Results  Component Value Date   TSH 1.96 09/27/2015   Lab Results  Component Value Date   HGBA1C 5.4 03/08/2015   Lab Results  Component Value Date   CHOL 235 (H) 03/06/2009   HDL 108.40 03/06/2009   LDLDIRECT 108.2 03/06/2009    Significant Diagnostic Results in last 30 days:  No results found.  Assessment/Plan Acute upper respiratory infection URI vs GERD, Z-pk, Mucinex, Omeprazole. Observe the patient.   GERD (gastroesophageal reflux disease) Trial of Omeprazole '20mg'$  daily. Observe.   Chronic pulmonary embolism (HCC) Hx of it, off Eliquis.   Hypothyroidism Continue Levothyroxine 124mg, 09/27/15 TSH 1.96    Seizure disorder ([place] Last seizure 2010 09/27/15 Dilantin lever 11(10-20) Continue Dilantin '100mg'$  am, '200mg'$  hs    Memory impairment- SNF/ assited living pt 05/19/14 MMSE 27/30, continue Aricept and Namenda    Depression continue Celexa '10mg'$  daily, Clonazepam 0.'5mg'$  bid, observe.     Back pain livable, continue Mobic 7.'5mg'$  daily, Tylenol '650mg'$  bid, may  lumbar spine/sacrum  X-ray if worsens.      Family/ staff  Communication: AL  Labs/tests ordered: none

## 2016-05-20 NOTE — Assessment & Plan Note (Signed)
continue Celexa '10mg'$  daily, Clonazepam 0.'5mg'$  bid, observe.

## 2016-05-20 NOTE — Assessment & Plan Note (Signed)
Trial of Omeprazole '20mg'$  daily. Observe.

## 2016-05-20 NOTE — Assessment & Plan Note (Signed)
05/19/14 MMSE 27/30, continue Aricept and Namenda 

## 2016-05-20 NOTE — Assessment & Plan Note (Signed)
Last seizure 2010 09/27/15 Dilantin lever 11(10-20) Continue Dilantin '100mg'$  am, '200mg'$  hs

## 2016-05-21 DIAGNOSIS — R41841 Cognitive communication deficit: Secondary | ICD-10-CM | POA: Diagnosis not present

## 2016-05-21 DIAGNOSIS — R296 Repeated falls: Secondary | ICD-10-CM | POA: Diagnosis not present

## 2016-05-21 DIAGNOSIS — R2681 Unsteadiness on feet: Secondary | ICD-10-CM | POA: Diagnosis not present

## 2016-05-21 DIAGNOSIS — M25552 Pain in left hip: Secondary | ICD-10-CM | POA: Diagnosis not present

## 2016-05-21 DIAGNOSIS — M6281 Muscle weakness (generalized): Secondary | ICD-10-CM | POA: Diagnosis not present

## 2016-05-23 DIAGNOSIS — R2681 Unsteadiness on feet: Secondary | ICD-10-CM | POA: Diagnosis not present

## 2016-05-23 DIAGNOSIS — M25552 Pain in left hip: Secondary | ICD-10-CM | POA: Diagnosis not present

## 2016-05-23 DIAGNOSIS — R296 Repeated falls: Secondary | ICD-10-CM | POA: Diagnosis not present

## 2016-05-23 DIAGNOSIS — M6281 Muscle weakness (generalized): Secondary | ICD-10-CM | POA: Diagnosis not present

## 2016-05-23 DIAGNOSIS — R41841 Cognitive communication deficit: Secondary | ICD-10-CM | POA: Diagnosis not present

## 2016-05-26 DIAGNOSIS — M25552 Pain in left hip: Secondary | ICD-10-CM | POA: Diagnosis not present

## 2016-05-26 DIAGNOSIS — R296 Repeated falls: Secondary | ICD-10-CM | POA: Diagnosis not present

## 2016-05-26 DIAGNOSIS — R41841 Cognitive communication deficit: Secondary | ICD-10-CM | POA: Diagnosis not present

## 2016-05-26 DIAGNOSIS — R2681 Unsteadiness on feet: Secondary | ICD-10-CM | POA: Diagnosis not present

## 2016-05-26 DIAGNOSIS — M6281 Muscle weakness (generalized): Secondary | ICD-10-CM | POA: Diagnosis not present

## 2016-05-27 DIAGNOSIS — M6281 Muscle weakness (generalized): Secondary | ICD-10-CM | POA: Diagnosis not present

## 2016-05-27 DIAGNOSIS — M25552 Pain in left hip: Secondary | ICD-10-CM | POA: Diagnosis not present

## 2016-05-27 DIAGNOSIS — R41841 Cognitive communication deficit: Secondary | ICD-10-CM | POA: Diagnosis not present

## 2016-05-27 DIAGNOSIS — R296 Repeated falls: Secondary | ICD-10-CM | POA: Diagnosis not present

## 2016-05-27 DIAGNOSIS — R2681 Unsteadiness on feet: Secondary | ICD-10-CM | POA: Diagnosis not present

## 2016-05-29 DIAGNOSIS — R296 Repeated falls: Secondary | ICD-10-CM | POA: Diagnosis not present

## 2016-05-29 DIAGNOSIS — R41841 Cognitive communication deficit: Secondary | ICD-10-CM | POA: Diagnosis not present

## 2016-05-29 DIAGNOSIS — M25552 Pain in left hip: Secondary | ICD-10-CM | POA: Diagnosis not present

## 2016-05-29 DIAGNOSIS — M6281 Muscle weakness (generalized): Secondary | ICD-10-CM | POA: Diagnosis not present

## 2016-05-29 DIAGNOSIS — R2681 Unsteadiness on feet: Secondary | ICD-10-CM | POA: Diagnosis not present

## 2016-06-03 ENCOUNTER — Other Ambulatory Visit: Payer: Self-pay | Admitting: *Deleted

## 2016-06-03 DIAGNOSIS — R2681 Unsteadiness on feet: Secondary | ICD-10-CM | POA: Diagnosis not present

## 2016-06-03 DIAGNOSIS — M25552 Pain in left hip: Secondary | ICD-10-CM | POA: Diagnosis not present

## 2016-06-03 DIAGNOSIS — M6281 Muscle weakness (generalized): Secondary | ICD-10-CM | POA: Diagnosis not present

## 2016-06-03 DIAGNOSIS — R296 Repeated falls: Secondary | ICD-10-CM | POA: Diagnosis not present

## 2016-06-03 DIAGNOSIS — R41841 Cognitive communication deficit: Secondary | ICD-10-CM | POA: Diagnosis not present

## 2016-06-03 MED ORDER — CLONAZEPAM 0.5 MG PO TABS: ORAL_TABLET | ORAL | 0 refills | Status: DC

## 2016-06-03 NOTE — Telephone Encounter (Signed)
[facility]

## 2016-06-05 DIAGNOSIS — M25552 Pain in left hip: Secondary | ICD-10-CM | POA: Diagnosis not present

## 2016-06-05 DIAGNOSIS — Z79899 Other long term (current) drug therapy: Secondary | ICD-10-CM | POA: Diagnosis not present

## 2016-06-05 DIAGNOSIS — R41841 Cognitive communication deficit: Secondary | ICD-10-CM | POA: Diagnosis not present

## 2016-06-05 DIAGNOSIS — R2681 Unsteadiness on feet: Secondary | ICD-10-CM | POA: Diagnosis not present

## 2016-06-05 DIAGNOSIS — R296 Repeated falls: Secondary | ICD-10-CM | POA: Diagnosis not present

## 2016-06-05 DIAGNOSIS — M6281 Muscle weakness (generalized): Secondary | ICD-10-CM | POA: Diagnosis not present

## 2016-06-09 DIAGNOSIS — R2681 Unsteadiness on feet: Secondary | ICD-10-CM | POA: Diagnosis not present

## 2016-06-09 DIAGNOSIS — R41841 Cognitive communication deficit: Secondary | ICD-10-CM | POA: Diagnosis not present

## 2016-06-09 DIAGNOSIS — R296 Repeated falls: Secondary | ICD-10-CM | POA: Diagnosis not present

## 2016-06-09 DIAGNOSIS — M6281 Muscle weakness (generalized): Secondary | ICD-10-CM | POA: Diagnosis not present

## 2016-06-09 DIAGNOSIS — M25552 Pain in left hip: Secondary | ICD-10-CM | POA: Diagnosis not present

## 2016-06-11 ENCOUNTER — Telehealth: Payer: Self-pay | Admitting: *Deleted

## 2016-06-11 DIAGNOSIS — R2681 Unsteadiness on feet: Secondary | ICD-10-CM | POA: Diagnosis not present

## 2016-06-11 DIAGNOSIS — R296 Repeated falls: Secondary | ICD-10-CM | POA: Diagnosis not present

## 2016-06-11 DIAGNOSIS — R41841 Cognitive communication deficit: Secondary | ICD-10-CM | POA: Diagnosis not present

## 2016-06-11 DIAGNOSIS — M6281 Muscle weakness (generalized): Secondary | ICD-10-CM | POA: Diagnosis not present

## 2016-06-11 DIAGNOSIS — M25552 Pain in left hip: Secondary | ICD-10-CM | POA: Diagnosis not present

## 2016-06-11 NOTE — Telephone Encounter (Signed)
Received CMN from [place] regarding Necessity of Oxygen. Filled out and given to Dr. Nyoka Cowden to review and sign.

## 2016-06-13 DIAGNOSIS — R2681 Unsteadiness on feet: Secondary | ICD-10-CM | POA: Diagnosis not present

## 2016-06-13 DIAGNOSIS — R41841 Cognitive communication deficit: Secondary | ICD-10-CM | POA: Diagnosis not present

## 2016-06-13 DIAGNOSIS — R296 Repeated falls: Secondary | ICD-10-CM | POA: Diagnosis not present

## 2016-06-13 DIAGNOSIS — M6281 Muscle weakness (generalized): Secondary | ICD-10-CM | POA: Diagnosis not present

## 2016-06-13 DIAGNOSIS — M25552 Pain in left hip: Secondary | ICD-10-CM | POA: Diagnosis not present

## 2016-06-16 ENCOUNTER — Encounter: Payer: Self-pay | Admitting: Internal Medicine

## 2016-06-16 ENCOUNTER — Non-Acute Institutional Stay: Payer: Medicare Other | Admitting: Internal Medicine

## 2016-06-16 DIAGNOSIS — R21 Rash and other nonspecific skin eruption: Secondary | ICD-10-CM | POA: Diagnosis not present

## 2016-06-16 DIAGNOSIS — E039 Hypothyroidism, unspecified: Secondary | ICD-10-CM

## 2016-06-16 DIAGNOSIS — F320 Major depressive disorder, single episode, mild: Secondary | ICD-10-CM | POA: Diagnosis not present

## 2016-06-16 DIAGNOSIS — E785 Hyperlipidemia, unspecified: Secondary | ICD-10-CM | POA: Diagnosis not present

## 2016-06-16 DIAGNOSIS — R2681 Unsteadiness on feet: Secondary | ICD-10-CM | POA: Diagnosis not present

## 2016-06-16 DIAGNOSIS — F0390 Unspecified dementia without behavioral disturbance: Secondary | ICD-10-CM | POA: Diagnosis not present

## 2016-06-16 DIAGNOSIS — G40909 Epilepsy, unspecified, not intractable, without status epilepticus: Secondary | ICD-10-CM | POA: Diagnosis not present

## 2016-06-16 NOTE — Progress Notes (Signed)
Progress Note    Location:  [place] Room Number: [place] of Service:  ALF [phone]) Provider:  Jeanmarie Hubert, MD  Patient Care Team: Estill Dooms, MD as PCP - General (Internal Medicine) [facility] [name], MD as Consulting Physician (Orthopedic Surgery) [name], MD as Consulting Physician (Physical Medicine and Rehabilitation) Melina Schools, MD as Consulting Physician (Orthopedic Surgery) [name], MD as Consulting Physician (Otolaryngology) Man Otho Darner, NP as Nurse Practitioner (Internal Medicine)  Extended Emergency Contact Information Primary Emergency Contact: Joshi,[name] Address: [address], [place] Phone: [phone] Mobile Phone: [phone] Relation: Son Secondary Emergency Contact: [name] [place] of [place] Phone: [phone] Mobile Phone: [phone] Relation: Daughter  Code Status:  DNR Goals of care: Advanced Directive information Advanced Directives 06/16/2016  Does Patient Have a Medical Advance Directive? Yes  Type of Paramedic of [place];Living will;Out of facility DNR (pink MOST or yellow form)  Does patient want to make changes to medical advance directive? -  Copy of [place] in Chart? Yes  Pre-existing out of facility DNR order (yellow form or pink MOST form) Yellow form placed in chart (order not valid for inpatient use)     Chief Complaint  Patient presents with  . Medical Management of Chronic Issues    routine visit    HPI:  Pt is a 83 y.o. female seen today for medical management of chronic diseases.    Acutely ill. Says she had at least 3 loose stools last night. Denies blood in the stool or abd pain. Noo nausea, but she does not feel like eating.  Dementia without behavioral disturbance, unspecified dementia type -  stable  Rash and nonspecific skin eruption -  resolved  Unstable gait - using walker  Seizure disorder (HCC) - no seizures since last seen  Hypothyroidism, unspecified type - normal TSH in May 2017  Dyslipidemia - no recent lab or current meds  Mild single current episode of major depressive disorder ([place]) - improved     Past Medical History:  Diagnosis Date  . Anemia, unspecified 03/18/2011  . Anxiety state, unspecified 01/2011  . Blood in stool 06/15/2012  . Corns and callosities 07/01/2011  . Diverticulosis 02/2011  . Dizziness   . DJD (degenerative joint disease)   . Dysuria 06/17/2011  . Hemorrhoids 02/2011  . Low back pain   . Mitral valve problem thickened   thickened  . Osteoarthritis of both hands 10/09/2015  . Osteoporosis 02/2011  . Other abnormal blood chemistry 0/30/2012  . Other acquired deformity of toe 12/16/2011  . Pain in joint, site unspecified 01/2012  . 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 Sacroiliitis, not elsewhere classified ([place]) 05/2011  . Seizures ([place])    Remotely  . Unspecified hypothyroidism 01/2011  . 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 Vitamin D deficiency 01/2011   Past Surgical History:  Procedure Laterality Date  . bletheroplasty    . CATARACT EXTRACTION W/ INTRAOCULAR LENS  IMPLANT, BILATERAL  2010  . CERVICAL LAMINECTOMY  2005  . HAMMER TOE SURGERY  2013   right 2nd toe Dr. Mallie Mussel  . JOINT REPLACEMENT Bilateral 2002 Right, 1992 Left   knees    Allergies  Allergen Reactions  . Macrodantin [Nitrofurantoin]   . Morphine And Related Other (See Comments)  Altered mental state   . Sulfa Antibiotics Other (See Comments)    Reaction: unknown    Allergies as of 06/16/2016      Reactions   Macrodantin [nitrofurantoin]    Morphine And Related Other (See Comments)   Altered mental state    Sulfa Antibiotics Other (See Comments)   Reaction: unknown      Medication List       Accurate as of 06/16/16 11:50 AM. Always  use your most recent med list.          acetaminophen 325 MG tablet Commonly known as:  TYLENOL Take 325 mg by mouth. Take 2 tablets twice daily   amoxicillin 500 MG capsule Commonly known as:  AMOXIL Take 2,000 mg by mouth as directed. 1 Hour Before Procedure.   BREO ELLIPTA 100-25 MCG/INH Aepb Generic drug:  fluticasone furoate-vilanterol Inhale 1 puff into the lungs daily.   CALCIUM 600+D 600-400 MG-UNIT tablet Generic drug:  Calcium Carbonate-Vitamin D Take 1 tablet by mouth daily.   citalopram 10 MG tablet Commonly known as:  CELEXA Take 10 mg by mouth. Take one tablet daily for anxiety   clonazePAM 0.5 MG tablet Commonly known as:  KLONOPIN Take one tablet by mouth 2 times a day at 8 am and 8 pm.   donepezil 10 MG tablet Commonly known as:  ARICEPT Take 10 mg by mouth daily.   guaiFENesin 600 MG 12 hr tablet Commonly known as:  MUCINEX Take by mouth. Take one tablet at bedtime   HYDROcodone-acetaminophen 5-325 MG tablet Commonly known as:  NORCO/VICODIN Take by mouth. Take 1/2 tablet twice a day as needed for pain   levothyroxine 100 MCG tablet Commonly known as:  SYNTHROID, LEVOTHROID Take 1 tablet (100 mcg total) by mouth daily before breakfast. For thyroid   meloxicam 7.5 MG tablet Commonly known as:  MOBIC Take 7.5 mg by mouth daily.   memantine 10 MG tablet Commonly known as:  NAMENDA Take 10 mg by mouth 2 (two) times daily.   Omega-3 1000 MG Caps Take 1 capsule by mouth daily. Take one capsule every morning   omeprazole 20 MG capsule Commonly known as:  PRILOSEC Take 20 mg by mouth. Take one capsule at bedtime   phenytoin 100 MG ER capsule Commonly known as:  DILANTIN Take 100-200 mg by mouth 2 (two) times daily. Take one capsule every morning; take 2 capsules at bedtime   TAB-A-VITE PO Take 1 tablet by mouth daily.   triamcinolone cream 0.1 % Commonly known as:  KENALOG   trolamine salicylate 10 % cream Commonly known as:   ASPERCREME Apply 1 application topically. Apply as needed   Vitamin D3 5000 units Tabs Take 1 tablet by mouth daily. Take one tablet daily       Review of Systems  Constitutional: Negative for diaphoresis.  HENT: Positive for hearing loss. Negative for congestion, ear discharge, ear pain, nosebleeds, sore throat and tinnitus.        Bilateral hearing loss. June 2015 ruptured right Tm with drainage, but little pain.  Eyes: Negative.   Respiratory: Positive for cough. Negative for shortness of breath.        History of dyspnea on exertion. Remains on oxygen 24/7 at this time. History of pulmonary embolism and Greenfield filter. Remains on warfarin.  Cardiovascular: Negative.   Gastrointestinal: Positive for diarrhea.  Endocrine: Negative for polydipsia.  Genitourinary: Positive for frequency.       2-3x/night  Musculoskeletal: Positive for arthralgias (both  hands), back pain and gait problem (unstable using walker).       Osteoarthritis with complaints of pain in multiple areas. Neck and right shoulder pains.  [place] 05/20/15 C/o back pain C/o right hip pain  Skin: Negative for rash (on neck).       Senile ecchymoses.   Allergic/Immunologic: Negative for environmental allergies.  Neurological: Positive for weakness. Negative for dizziness, tremors and seizures.       Hx seizures.  Head trauma sustained in a fall on 10/05/27/15.  Psychiatric/Behavioral: Positive for confusion, decreased concentration and dysphoric mood. Negative for suicidal ideas. The patient is nervous/anxious.        Chronic anxiety. Memory deficits.    Immunization History  Administered Date(s) Administered  . Influenza Whole 03/17/2007, 02/16/2009, 01/30/2010, 02/16/2013  . Influenza-Unspecified 03/02/2014, 02/22/2015, 02/28/2016  . PPD Test 07/09/2010  . Pneumococcal Polysaccharide-23 05/19/2005, 05/23/2013  . Td 05/19/2005  . Tetanus 10/06/2012   Pertinent  Health Maintenance Due  Topic Date Due  .  DEXA SCAN  11/19/1994  . PNA vac Low Risk Adult (2 of 2 - PCV13) 05/23/2014  . INFLUENZA VACCINE  Completed   Fall Risk  10/09/2015 07/10/2015 03/26/2015 12/19/2014 03/22/2013  Falls in the past year? No Yes Yes Yes Yes  Number falls in past yr: - '1 1 1 2 '$ or more  Injury with Fall? - Yes No Yes -    Vitals:   06/16/16 1125  BP: 110/70  Pulse: 66  Resp: 20  Temp: 98 F (36.7 C)  SpO2: 96%  Weight: 140 lb 12.8 oz (63.9 kg)  Height: 5' (1.524 m)   Body mass index is 27.5 kg/m. Physical Exam  Constitutional: She is oriented to person, place, and time. She appears well-developed and well-nourished. No distress.  HENT:  Bilateral hearing loss. Rupture of the right TM. Torus palatinus.   Eyes: Conjunctivae and EOM are normal. Pupils are equal, round, and reactive to light.  Lens implants in both eyes  Neck: Normal range of motion. Neck supple. No JVD present. No tracheal deviation present. No thyromegaly present.  Cardiovascular: Normal rate, regular rhythm, normal heart sounds and intact distal pulses.  Exam reveals no gallop and no friction rub.   No murmur heard. Pulmonary/Chest: Effort normal and breath sounds normal. No respiratory distress. She has no wheezes. She exhibits no tenderness.  Increased breath sounds posterior lungs.   Abdominal: Soft. Bowel sounds are normal. She exhibits no distension and no mass. There is no tenderness.  Genitourinary: Rectal exam shows guaiac negative stool.  Genitourinary Comments: From previous exam: Hemorrhoids. Normal sphincter tone. Stools are normal brown color.  Musculoskeletal: Normal range of motion. She exhibits tenderness and deformity. She exhibits no edema.  Tender in the posterior neck. Mild restriction in movements. Hammertoes at the left second and third. Right second toe overlaps the great toe. SP surgical repair of the right 2nd toe.  Shoulders sore. Painful in the right shoulder when she lifts overhead and rotates at the  shpoulder.[facility] July 2014. Pain in both hands and significant deformities related to OA.  Unstable gait. Using walker with 2 front wheels and rear skids. Weaker grip of the right hand.. Tender in the left SI joint. Tender in all enlarged finger joints.  Pain in the right hip, X-ray 03/10/16 no acute fx  Lymphadenopathy:    She has no cervical adenopathy.  Neurological: She is alert and oriented to person, place, and time. No cranial nerve deficit. Coordination normal.  No loss  of grip strength. Loss of memory. 04/04/14 MMSE 25/30. Passed clock drawing. 07/04/14 MMSE 28/30. Failed clock drawing. Bilateral tremor.  Skin: Skin is warm and dry. No rash (erythematous blotches on the neck anteriorly) noted. No erythema. No pallor.     Psychiatric: Thought content normal.  depressed    Labs reviewed:  Recent Labs  09/27/15 12/13/15 03/05/16 1730 03/30/16 2033  NA 141 142 140 139  K 4.2 4.4 4.8 4.6  CL  --   --  105 105  CO2  --   --  29  --   GLUCOSE  --   --  99 89  BUN 29* 20 31* 33*  CREATININE 0.6  --  0.95 0.90  CALCIUM  --   --  8.7*  --     Recent Labs  09/27/15 03/05/16 1730  AST 18 27  ALT 13 19  ALKPHOS 84 82  BILITOT  --  0.5  PROT  --  6.4*  ALBUMIN  --  3.5    Recent Labs  12/13/15 03/05/16 1730 03/30/16 2033  WBC 6.1 5.9  --   NEUTROABS  --  3.9  --   HGB 14.1 12.6 13.3  HCT  --  39.4 39.0  MCV  --  99.5  --   PLT 190 173  --    Lab Results  Component Value Date   TSH 1.96 09/27/2015   Lab Results  Component Value Date   HGBA1C 5.4 03/08/2015   Lab Results  Component Value Date   CHOL 235 (H) 03/06/2009   HDL 108.40 03/06/2009   LDLDIRECT 108.2 03/06/2009    Assessment/Plan 1. Dementia without behavioral disturbance, unspecified dementia type unchanged  2. Rash and nonspecific skin eruption resolved  3. Unstable gait No falls. Continue to use walker.  4. Seizure disorder ([place]) Stable without recent seizure  5.  Hypothyroidism, unspecified type compensated  6. Dyslipidemia I don't think this needs to be followed  7. Mild single current episode of major depressive disorder ([place]) improved

## 2016-06-17 DIAGNOSIS — M6281 Muscle weakness (generalized): Secondary | ICD-10-CM | POA: Diagnosis not present

## 2016-06-17 DIAGNOSIS — R41841 Cognitive communication deficit: Secondary | ICD-10-CM | POA: Diagnosis not present

## 2016-06-17 DIAGNOSIS — R2681 Unsteadiness on feet: Secondary | ICD-10-CM | POA: Diagnosis not present

## 2016-06-17 DIAGNOSIS — R296 Repeated falls: Secondary | ICD-10-CM | POA: Diagnosis not present

## 2016-06-17 DIAGNOSIS — M25552 Pain in left hip: Secondary | ICD-10-CM | POA: Diagnosis not present

## 2016-07-15 ENCOUNTER — Encounter: Payer: Self-pay | Admitting: Nurse Practitioner

## 2016-07-15 ENCOUNTER — Non-Acute Institutional Stay: Payer: Medicare Other | Admitting: Nurse Practitioner

## 2016-07-15 DIAGNOSIS — J9611 Chronic respiratory failure with hypoxia: Secondary | ICD-10-CM

## 2016-07-15 DIAGNOSIS — M19042 Primary osteoarthritis, left hand: Secondary | ICD-10-CM

## 2016-07-15 DIAGNOSIS — F0390 Unspecified dementia without behavioral disturbance: Secondary | ICD-10-CM

## 2016-07-15 DIAGNOSIS — E039 Hypothyroidism, unspecified: Secondary | ICD-10-CM

## 2016-07-15 DIAGNOSIS — M19041 Primary osteoarthritis, right hand: Secondary | ICD-10-CM | POA: Diagnosis not present

## 2016-07-15 DIAGNOSIS — G40909 Epilepsy, unspecified, not intractable, without status epilepticus: Secondary | ICD-10-CM

## 2016-07-15 DIAGNOSIS — F320 Major depressive disorder, single episode, mild: Secondary | ICD-10-CM

## 2016-07-15 NOTE — Assessment & Plan Note (Signed)
continue Celexa '10mg'$  daily, Clonazepam 0.'5mg'$  bid, observe.

## 2016-07-15 NOTE — Assessment & Plan Note (Signed)
livable, continue Mobic 7.'5mg'$  daily, Tylenol '650mg'$  to bid, prn Norco

## 2016-07-15 NOTE — Progress Notes (Signed)
Location:  [place] [facility] Room Number: 36 Place of Service:  [id number]) Provider:  Dreydon Cardenas, Manxie  NP  Jeanmarie Hubert, MD  Patient Care Team: Estill Dooms, MD as PCP - General (Internal Medicine) [facility] [name], MD as Consulting Physician (Orthopedic Surgery) [name], MD as Consulting Physician (Physical Medicine and Rehabilitation) Melina Schools, MD as Consulting Physician (Orthopedic Surgery) [name], MD as Consulting Physician (Otolaryngology) Wasil Wolke Otho Darner, NP as Nurse Practitioner (Internal Medicine)  Extended Emergency Contact Information Primary Emergency Contact: Manzella,[name] Address: [address], [place] Phone: [phone] Mobile Phone: [phone] Relation: Son Secondary Emergency Contact: [name] [place] of [place] Phone: [phone] Mobile Phone: [phone] Relation: Daughter  Code Status:  DNR Goals of care: Advanced Directive information Advanced Directives 07/15/2016  Does Patient Have a Medical Advance Directive? Yes  Type of Paramedic of [place];Living will;Out of facility DNR (pink MOST or yellow form)  Does patient want to make changes to medical advance directive? No - Patient declined  Copy of [place] in Chart? Yes  Pre-existing out of facility DNR order (yellow form or pink MOST form) Yellow form placed in chart (order not valid for inpatient use)     Chief Complaint  Patient presents with  . Medical Management of Chronic Issues    HPI:  Pt is a 83 y.o. female seen today for medical management of chronic diseases.    Hx of Seizure, on Dilantin '100mg'$  am '200mg'$  hs, no active seizure activity since last visited, her mood is managed with Celexa '10mg'$ , clonazepam bid, hypothyroidism, on Levothyroxine 146mg, last TSH 09/27/15 TSH 1.96. Dementia, resides at AL, takign Aricept and Namenda, forgetful. Hx of  PE, offEliquis. Lower back pain, taking Mobic 7.'5mg'$ , Tylenol '325mg'$  bid    Past Medical History:  Diagnosis Date  . Anemia, unspecified 03/18/2011  . Anxiety state, unspecified 01/2011  . Blood in stool 06/15/2012  . Corns and callosities 07/01/2011  . Diverticulosis 02/2011  . Dizziness   . DJD (degenerative joint disease)   . Dysuria 06/17/2011  . Hemorrhoids 02/2011  . Low back pain   . Mitral valve problem thickened   thickened  . Osteoarthritis of both hands 10/09/2015  . Osteoporosis 02/2011  . Other abnormal blood chemistry 0/30/2012  . Other acquired deformity of toe 12/16/2011  . Pain in joint, site unspecified 01/2012  . 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 Sacroiliitis, not elsewhere classified ([place] 05/2011  . Seizures ([place]    Remotely  . Unspecified hypothyroidism 01/2011  . 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 Vitamin D deficiency 01/2011   Past Surgical History:  Procedure Laterality Date  . bletheroplasty    . CATARACT EXTRACTION W/ INTRAOCULAR LENS  IMPLANT, BILATERAL  2010  . CERVICAL LAMINECTOMY  2005  . HAMMER TOE SURGERY  2013   right 2nd toe Dr. HMallie Mussel . JOINT REPLACEMENT Bilateral 2002 Right, 1992 Left   knees    Allergies  Allergen Reactions  . Macrodantin [Nitrofurantoin]   . Morphine And Related Other (See Comments)    Altered mental state   . Sulfa Antibiotics Other (See Comments)    Reaction: unknown    Allergies as of 07/15/2016      Reactions   Macrodantin [nitrofurantoin]    Morphine And Related Other (See Comments)  Altered mental state    Sulfa Antibiotics Other (See Comments)   Reaction: unknown      Medication List       Accurate as of 07/15/16  2:14 PM. Always use your most recent med list.          acetaminophen 325 MG tablet Commonly known as:  TYLENOL Take 325 mg by mouth. Take 2 tablets twice daily   amoxicillin 500 MG capsule Commonly known as:   AMOXIL Take 2,000 mg by mouth as directed. 1 Hour Before Procedure.   BREO ELLIPTA 100-25 MCG/INH Aepb Generic drug:  fluticasone furoate-vilanterol Inhale 1 puff into the lungs daily.   CALCIUM 600+D 600-400 MG-UNIT tablet Generic drug:  Calcium Carbonate-Vitamin D Take 1 tablet by mouth daily.   citalopram 10 MG tablet Commonly known as:  CELEXA Take 10 mg by mouth. Take one tablet daily for anxiety   clonazePAM 0.5 MG tablet Commonly known as:  KLONOPIN Take one tablet by mouth 2 times a day at 8 am and 8 pm.   donepezil 10 MG tablet Commonly known as:  ARICEPT Take 10 mg by mouth daily.   HYDROcodone-acetaminophen 5-325 MG tablet Commonly known as:  NORCO/VICODIN Take by mouth. Take 1/2 tablet twice a day as needed for pain   levothyroxine 100 MCG tablet Commonly known as:  SYNTHROID, LEVOTHROID Take 1 tablet (100 mcg total) by mouth daily before breakfast. For thyroid   meloxicam 7.5 MG tablet Commonly known as:  MOBIC Take 7.5 mg by mouth daily.   memantine 10 MG tablet Commonly known as:  NAMENDA Take 10 mg by mouth 2 (two) times daily.   Omega-3 1000 MG Caps Take 1 capsule by mouth daily. Take one capsule every morning   omeprazole 20 MG capsule Commonly known as:  PRILOSEC Take 20 mg by mouth at bedtime.   phenytoin 100 MG ER capsule Commonly known as:  DILANTIN Take 100-200 mg by mouth 2 (two) times daily. Take one capsule every morning; take 2 capsules at bedtime   TAB-A-VITE PO Take 1 tablet by mouth daily.   triamcinolone cream 0.1 % Commonly known as:  KENALOG   trolamine salicylate 10 % cream Commonly known as:  ASPERCREME Apply 1 application topically. Apply as needed   Vitamin D3 5000 units Tabs Take 1 tablet by mouth daily. Take one tablet daily       Review of Systems  Constitutional: Negative for diaphoresis.  HENT: Positive for hearing loss. Negative for congestion, ear discharge, ear pain, nosebleeds, sore throat and tinnitus.         Bilateral hearing loss. June 2015 ruptured right Tm with drainage, but little pain.  Eyes: Negative.   Respiratory: Positive for cough. Negative for shortness of breath.        History of dyspnea on exertion. Remains on oxygen 24/7 at this time. History of pulmonary embolism and [place] filter. Remains on warfarin.  Cardiovascular: Negative.   Gastrointestinal: Positive for diarrhea.  Endocrine: Negative for polydipsia.  Genitourinary: Positive for frequency.       2-3x/night  Musculoskeletal: Positive for arthralgias (both hands), back pain and gait problem (unstable using walker).       Osteoarthritis with complaints of pain in multiple areas. Neck and right shoulder pains.  [place] 05/20/15 C/o back pain C/o right hip pain  Skin: Negative for rash (on neck).       Senile ecchymoses.   Allergic/Immunologic: Negative for environmental allergies.  Neurological: Positive for weakness. Negative for  dizziness, tremors and seizures.       Hx seizures.  Head trauma sustained in a fall on 10/05/27/15.  Psychiatric/Behavioral: Positive for confusion, decreased concentration and dysphoric mood. Negative for suicidal ideas. The patient is nervous/anxious.        Chronic anxiety. Memory deficits.    Immunization History  Administered Date(s) Administered  . Influenza Whole 03/17/2007, 02/16/2009, 01/30/2010, 02/16/2013  . Influenza-Unspecified 03/02/2014, 02/22/2015, 02/28/2016  . PPD Test 07/09/2010  . Pneumococcal Polysaccharide-23 05/19/2005, 05/23/2013  . Td 05/19/2005  . Tetanus 10/06/2012   Pertinent  Health Maintenance Due  Topic Date Due  . DEXA SCAN  11/19/1994  . PNA vac Low Risk Adult (2 of 2 - PCV13) 05/23/2014  . INFLUENZA VACCINE  Completed   Fall Risk  10/09/2015 07/10/2015 03/26/2015 12/19/2014 03/22/2013  Falls in the past year? No Yes Yes Yes Yes  Number falls in past yr: - '1 1 1 2 '$ or more  Injury with Fall? - Yes No Yes -   Functional Status Survey:    Vitals:    07/15/16 1332  BP: (!) 119/58  Pulse: 66  Resp: 18  Temp: 97.5 F (36.4 C)  SpO2: 93%  Weight: 140 lb (63.5 kg)  Height: 5' (1.524 m)   Body mass index is 27.34 kg/m. Physical Exam  Constitutional: She is oriented to person, place, and time. She appears well-developed and well-nourished. No distress.  HENT:  Bilateral hearing loss. Rupture of the right TM. Torus palatinus.   Eyes: Conjunctivae and EOM are normal. Pupils are equal, round, and reactive to light.  Lens implants in both eyes  Neck: Normal range of motion. Neck supple. No JVD present. No tracheal deviation present. No thyromegaly present.  Cardiovascular: Normal rate, regular rhythm, normal heart sounds and intact distal pulses.  Exam reveals no gallop and no friction rub.   No murmur heard. Pulmonary/Chest: Effort normal and breath sounds normal. No respiratory distress. She has no wheezes. She exhibits no tenderness.  Increased breath sounds posterior lungs.   Abdominal: Soft. Bowel sounds are normal. She exhibits no distension and no mass. There is no tenderness.  Genitourinary: Rectal exam shows guaiac negative stool.  Genitourinary Comments: From previous exam: Hemorrhoids. Normal sphincter tone. Stools are normal brown color.  Musculoskeletal: Normal range of motion. She exhibits tenderness and deformity. She exhibits no edema.  Tender in the posterior neck. Mild restriction in movements. Hammertoes at the left second and third. Right second toe overlaps the great toe. SP surgical repair of the right 2nd toe.  Shoulders sore. Painful in the right shoulder when she lifts overhead and rotates at the shpoulder.[facility] July 2014. Pain in both hands and significant deformities related to OA.  Unstable gait. Using walker with 2 front wheels and rear skids. Weaker grip of the right hand.. Tender in the left SI joint. Tender in all enlarged finger joints.  Pain in the right hip, X-ray 03/10/16 no acute fx   Lymphadenopathy:    She has no cervical adenopathy.  Neurological: She is alert and oriented to person, place, and time. No cranial nerve deficit. Coordination normal.  No loss of grip strength. Loss of memory. 04/04/14 MMSE 25/30. Passed clock drawing. 07/04/14 MMSE 28/30. Failed clock drawing. Bilateral tremor.  Skin: Skin is warm and dry. No rash (erythematous blotches on the neck anteriorly) noted. No erythema. No pallor.     Psychiatric: Thought content normal.  depressed    Labs reviewed:  Recent Labs  09/27/15 12/13/15 03/05/16 1730  03/30/16 2033  NA 141 142 140 139  K 4.2 4.4 4.8 4.6  CL  --   --  105 105  CO2  --   --  29  --   GLUCOSE  --   --  99 89  BUN 29* 20 31* 33*  CREATININE 0.6  --  0.95 0.90  CALCIUM  --   --  8.7*  --     Recent Labs  09/27/15 03/05/16 1730  AST 18 27  ALT 13 19  ALKPHOS 84 82  BILITOT  --  0.5  PROT  --  6.4*  ALBUMIN  --  3.5    Recent Labs  12/13/15 03/05/16 1730 03/30/16 2033  WBC 6.1 5.9  --   NEUTROABS  --  3.9  --   HGB 14.1 12.6 13.3  HCT  --  39.4 39.0  MCV  --  99.5  --   PLT 190 173  --    Lab Results  Component Value Date   TSH 1.96 09/27/2015   Lab Results  Component Value Date   HGBA1C 5.4 03/08/2015   Lab Results  Component Value Date   CHOL 235 (H) 03/06/2009   HDL 108.40 03/06/2009   LDLDIRECT 108.2 03/06/2009    Significant Diagnostic Results in last 30 days:  No results found.  Assessment/Plan GERD (gastroesophageal reflux disease) Trial of Omeprazole '20mg'$  daily. Observe.   Hypothyroidism Continue Levothyroxine 194mg, 09/27/15 TSH 1.96, update CBC CMP TSH  Dementia 05/19/14 MMSE 27/30, continue Aricept and Namenda  [place]iz[place] disorder ([place] Last seizure 2010 09/27/15 Dilantin lever 11(10-20) Continue Dilantin '100mg'$  am, '200mg'$  hs    Osteoarthritis of both hands livable, continue Mobic 7.'5mg'$  daily, Tylenol '650mg'$  to bid, prn Norco  Depression continue Celexa '10mg'$  daily,  Clonazepam 0.'5mg'$  bid, observe.   Hypoxemic respiratory failure, chronic (HCC) Stable, continue Ellipta daily, Mucinex qhs.     Family/ staff Communication:   Labs/tests ordered: CBC CMP TSH

## 2016-07-15 NOTE — Assessment & Plan Note (Signed)
Trial of Omeprazole '20mg'$  daily. Observe.

## 2016-07-15 NOTE — Assessment & Plan Note (Signed)
Stable, continue Ellipta daily, Mucinex qhs.

## 2016-07-15 NOTE — Assessment & Plan Note (Signed)
Last seizure 2010 09/27/15 Dilantin lever 11(10-20) Continue Dilantin '100mg'$  am, '200mg'$  hs

## 2016-07-15 NOTE — Assessment & Plan Note (Signed)
05/19/14 MMSE 27/30, continue Aricept and Namenda 

## 2016-07-15 NOTE — Assessment & Plan Note (Signed)
Continue Levothyroxine 125mg, 09/27/15 TSH 1.96, update CBC CMP TSH

## 2016-07-17 DIAGNOSIS — I2782 Chronic pulmonary embolism: Secondary | ICD-10-CM | POA: Diagnosis not present

## 2016-07-17 DIAGNOSIS — E039 Hypothyroidism, unspecified: Secondary | ICD-10-CM | POA: Diagnosis not present

## 2016-07-17 LAB — HEPATIC FUNCTION PANEL
ALK PHOS: 77 U/L (ref 25–125)
ALT: 11 U/L (ref 7–35)
AST: 21 U/L (ref 13–35)
BILIRUBIN, TOTAL: 0.4 mg/dL

## 2016-07-17 LAB — CBC AND DIFFERENTIAL
HEMATOCRIT: 41 % (ref 36–46)
HEMOGLOBIN: 13.4 g/dL (ref 12.0–16.0)
Platelets: 199 10*3/uL (ref 150–399)
WBC: 6.7 10^3/mL

## 2016-07-17 LAB — BASIC METABOLIC PANEL
BUN: 26 mg/dL — AB (ref 4–21)
Creatinine: 0.7 mg/dL (ref <=1.1)
GLUCOSE: 87 mg/dL
POTASSIUM: 4 mmol/L (ref 3.4–5.3)
SODIUM: 144 mmol/L (ref 137–147)

## 2016-07-17 LAB — TSH: TSH: 1.51 u[IU]/mL (ref <=5.90)

## 2016-07-21 ENCOUNTER — Other Ambulatory Visit: Payer: Self-pay | Admitting: *Deleted

## 2016-07-31 DIAGNOSIS — F411 Generalized anxiety disorder: Secondary | ICD-10-CM | POA: Diagnosis not present

## 2016-08-12 ENCOUNTER — Non-Acute Institutional Stay: Payer: Medicare Other | Admitting: Nurse Practitioner

## 2016-08-12 DIAGNOSIS — F0390 Unspecified dementia without behavioral disturbance: Secondary | ICD-10-CM | POA: Diagnosis not present

## 2016-08-12 DIAGNOSIS — J9611 Chronic respiratory failure with hypoxia: Secondary | ICD-10-CM

## 2016-08-12 DIAGNOSIS — K219 Gastro-esophageal reflux disease without esophagitis: Secondary | ICD-10-CM

## 2016-08-12 DIAGNOSIS — M5440 Lumbago with sciatica, unspecified side: Secondary | ICD-10-CM | POA: Diagnosis not present

## 2016-08-12 DIAGNOSIS — F320 Major depressive disorder, single episode, mild: Secondary | ICD-10-CM

## 2016-08-12 DIAGNOSIS — G40909 Epilepsy, unspecified, not intractable, without status epilepticus: Secondary | ICD-10-CM | POA: Diagnosis not present

## 2016-08-12 NOTE — Assessment & Plan Note (Signed)
Last seizure 2010 09/27/15 Dilantin lever 11(10-20) Continue Dilantin '100mg'$  am, '200mg'$  hs

## 2016-08-12 NOTE — Assessment & Plan Note (Addendum)
livable, continue Mobic 7.'5mg'$  daily, Tylenol '650mg'$  bid is adequate, ambulates with walker. Prn Norco available to her.

## 2016-08-12 NOTE — Assessment & Plan Note (Signed)
Stable, continue Ellipta daily

## 2016-08-12 NOTE — Progress Notes (Signed)
Location:      Place of Service:    Provider:  Roxy Filler, Manxie  NP  Jeanmarie Hubert, MD  Patient Care Team: Estill Dooms, MD as PCP - General (Internal Medicine) [facility] [name], MD as Consulting Physician (Orthopedic Surgery) [name], MD as Consulting Physician (Physical Medicine and Rehabilitation) Melina Schools, MD as Consulting Physician (Orthopedic Surgery) [name], MD as Consulting Physician (Otolaryngology) Robey Massmann Otho Darner, NP as Nurse Practitioner (Internal Medicine)  Extended Emergency Contact Information Primary Emergency Contact: Kapral,[name] Address: [address], [place] of [place] Phone: [phone] Mobile Phone: [phone] Relation: Son Secondary Emergency Contact: [name] [place] of [place] Phone: [phone] Mobile Phone: [phone] Relation: Daughter  Code Status:  DNR Goals of care: Advanced Directive information Advanced Directives 07/15/2016  Does Patient Have a Medical Advance Directive? Yes  Type of Paramedic of [place];Living will;Out of facility DNR (pink MOST or yellow form)  Does patient want to make changes to medical advance directive? No - Patient declined  Copy of [place] in Chart? Yes  Pre-existing out of facility DNR order (yellow form or pink MOST form) Yellow form placed in chart (order not valid for inpatient use)     No chief complaint on file.   HPI:  Pt is a 83 y.o. female seen today for medical management of chronic diseases.    Hx of Seizure, on Dilantin '100mg'$  am '200mg'$  hs, no active seizure activity since last visited, her mood is managed with Cymbalta '30mg'$ , clonazepam bid, hypothyroidism, on Levothyroxine 130mg, last TSH 1.51 07/17/16. Dementia, resides at AL, takign Aricept and Namenda, forgetful. Hx of PE, offEliquis. Lower back pain, taking Mobic 7.'5mg'$ , Tylenol '325mg'$  bid    Past Medical History:    Diagnosis Date  . Anemia, unspecified 03/18/2011  . Anxiety state, unspecified 01/2011  . Blood in stool 06/15/2012  . Corns and callosities 07/01/2011  . Diverticulosis 02/2011  . Dizziness   . DJD (degenerative joint disease)   . Dysuria 06/17/2011  . Hemorrhoids 02/2011  . Low back pain   . Mitral valve problem thickened   thickened  . Osteoarthritis of both hands 10/09/2015  . Osteoporosis 02/2011  . Other abnormal blood chemistry 0/30/2012  . Other acquired deformity of toe 12/16/2011  . Pain in joint, site unspecified 01/2012  . 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 [place]ro[place]itis, not elsewhere classified ([place] 05/[place]  . Seizures ([place]    Remotely  . Unspecified hypothyroidism 01/2011  . 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 Vitamin D deficiency 01/2011   Past Surgical History:  Procedure Laterality Date  . bletheroplasty    . CATARACT EXTRACTION W/ INTRAOCULAR LENS  IMPLANT, BILATERAL  2010  . CERVICAL LAMINECTOMY  2005  . HAMMER TOE SURGERY  2013   right 2nd toe Dr. HMallie Mussel . JOINT REPLACEMENT Bilateral 2002 Right, 1992 Left   knees    Allergies  Allergen Reactions  . Macrodantin [Nitrofurantoin]   . Morphine And Related Other (See Comments)    Altered mental state   . Sulfa Antibiotics Other (See Comments)    Reaction: unknown    Allergies as of 08/12/2016      Reactions   Macrodantin [nitrofurantoin]    Morphine And Related Other (See Comments)   Altered mental state    Sulfa Antibiotics Other (See Comments)  Reaction: unknown      Medication List       Accurate as of 08/12/16 11:39 AM. Always use your most recent med list.          acetaminophen 325 MG tablet Commonly known as:  TYLENOL Take 325 mg by mouth. Take 2 tablets twice daily   amoxicillin 500 MG capsule Commonly known as:  AMOXIL Take 2,000 mg by mouth as directed. 1 Hour Before Procedure.   BREO ELLIPTA 100-25 MCG/INH  Aepb Generic drug:  fluticasone furoate-vilanterol Inhale 1 puff into the lungs daily.   CALCIUM 600+D 600-400 MG-UNIT tablet Generic drug:  Calcium Carbonate-Vitamin D Take 1 tablet by mouth daily.   citalopram 10 MG tablet Commonly known as:  CELEXA Take 10 mg by mouth. Take one tablet daily for anxiety   clonazePAM 0.5 MG tablet Commonly known as:  KLONOPIN Take one tablet by mouth 2 times a day at 8 am and 8 pm.   donepezil 10 MG tablet Commonly known as:  ARICEPT Take 10 mg by mouth daily.   HYDROcodone-acetaminophen 5-325 MG tablet Commonly known as:  NORCO/VICODIN Take by mouth. Take 1/2 tablet twice a day as needed for pain   levothyroxine 100 MCG tablet Commonly known as:  SYNTHROID, LEVOTHROID Take 1 tablet (100 mcg total) by mouth daily before breakfast. For thyroid   meloxicam 7.5 MG tablet Commonly known as:  MOBIC Take 7.5 mg by mouth daily.   memantine 10 MG tablet Commonly known as:  NAMENDA Take 10 mg by mouth 2 (two) times daily.   Omega-3 1000 MG Caps Take 1 capsule by mouth daily. Take one capsule every morning   omeprazole 20 MG capsule Commonly known as:  PRILOSEC Take 20 mg by mouth at bedtime.   phenytoin 100 MG ER capsule Commonly known as:  DILANTIN Take 100-200 mg by mouth 2 (two) times daily. Take one capsule every morning; take 2 capsules at bedtime   TAB-A-VITE PO Take 1 tablet by mouth daily.   triamcinolone cream 0.1 % Commonly known as:  KENALOG   trolamine salicylate 10 % cream Commonly known as:  ASPERCREME Apply 1 application topically. Apply as needed   Vitamin D3 5000 units Tabs Take 1 tablet by mouth daily. Take one tablet daily       Review of Systems  Constitutional: Negative for diaphoresis.  HENT: Positive for hearing loss. Negative for congestion, ear discharge, ear pain, nosebleeds, sore throat and tinnitus.        Bilateral hearing loss. June 2015 ruptured right Tm with drainage, but little pain.  Eyes:  Negative.   Respiratory: Positive for cough. Negative for shortness of breath.        History of dyspnea on exertion. Remains on oxygen 24/7 at this time. History of pulmonary embolism and [place] filter. Remains on warfarin.  Cardiovascular: Negative.   Gastrointestinal: Positive for diarrhea.  Endocrine: Negative for polydipsia.  Genitourinary: Positive for frequency.       2-3x/night  Musculoskeletal: Positive for arthralgias (both hands), back pain and gait problem (unstable using walker).       Osteoarthritis with complaints of pain in multiple areas. Neck and right shoulder pains.  [place] 05/20/15 C/o back pain C/o right hip pain  Skin: Negative for rash (on neck).       Senile ecchymoses.   Allergic/Immunologic: Negative for environmental allergies.  Neurological: Positive for tremors and weakness. Negative for dizziness and seizures.       Hx seizures.  Head trauma sustained in a fall on 10/05/27/15. Involuntary lip smacking and mild tremor in hands.   Psychiatric/Behavioral: Positive for confusion, decreased concentration and dysphoric mood. Negative for suicidal ideas. The patient is nervous/anxious.        Chronic anxiety. Memory deficits.    Immunization History  Administered Date(s) Administered  . Influenza Whole 03/17/2007, 02/16/2009, 01/30/2010, 02/16/2013  . Influenza-Unspecified 03/02/2014, 02/22/2015, 02/28/2016  . PPD Test 07/09/2010  . Pneumococcal Polysaccharide-23 05/19/2005, 05/23/2013  . Td 05/19/2005  . Tetanus 10/06/2012   Pertinent  Health Maintenance Due  Topic Date Due  . DEXA SCAN  11/19/1994  . PNA vac Low Risk Adult (2 of 2 - PCV13) 05/23/2014  . INFLUENZA VACCINE  Completed   Fall Risk  10/09/2015 07/10/2015 03/26/2015 12/19/2014 03/22/2013  Falls in the past year? No Yes Yes Yes Yes  Number falls in past yr: - '1 1 1 2 '$ or more  Injury with Fall? - Yes No Yes -   Functional Status Survey:    There were no vitals filed for this visit. There  is no height or weight on file to calculate BMI. Physical Exam  Constitutional: She is oriented to person, place, and time. She appears well-developed and well-nourished. No distress.  HENT:  Bilateral hearing loss. Rupture of the right TM. Torus palatinus.   Eyes: Conjunctivae and EOM are normal. Pupils are equal, round, and reactive to light.  Lens implants in both eyes  Neck: Normal range of motion. Neck supple. No JVD present. No tracheal deviation present. No thyromegaly present.  Cardiovascular: Normal rate, regular rhythm, normal heart sounds and intact distal pulses.  Exam reveals no gallop and no friction rub.   No murmur heard. Pulmonary/Chest: Effort normal and breath sounds normal. No respiratory distress. She has no wheezes. She exhibits no tenderness.  Increased breath sounds posterior lungs.   Abdominal: Soft. Bowel sounds are normal. She exhibits no distension and no mass. There is no tenderness.  Genitourinary: Rectal exam shows guaiac negative stool.  Genitourinary Comments: From previous exam: Hemorrhoids. Normal sphincter tone. Stools are normal brown color.  Musculoskeletal: Normal range of motion. She exhibits tenderness and deformity. She exhibits no edema.  Tender in the posterior neck. Mild restriction in movements. Hammertoes at the left second and third. Right second toe overlaps the great toe. SP surgical repair of the right 2nd toe.  Shoulders sore. Painful in the right shoulder when she lifts overhead and rotates at the shpoulder.[facility] July 2014. Pain in both hands and significant deformities related to OA.  Unstable gait. Using walker with 2 front wheels and rear skids. Weaker grip of the right hand.. Tender in the left SI joint. Tender in all enlarged finger joints.  Pain in the right hip, X-ray 03/10/16 no acute fx  Lymphadenopathy:    She has no cervical adenopathy.  Neurological: She is alert and oriented to person, place, and time. No cranial nerve  deficit. Coordination normal.  No loss of grip strength. Loss of memory. 04/04/14 MMSE 25/30. Passed clock drawing. 07/04/14 MMSE 28/30. Failed clock drawing. Bilateral tremor. Lip smacking.   Skin: Skin is warm and dry. No rash (erythematous blotches on the neck anteriorly) noted. No erythema. No pallor.     Psychiatric: Thought content normal.  depressed    Labs reviewed:  Recent Labs  03/05/16 1730 03/30/16 2033 07/17/16  NA 140 139 144  K 4.8 4.6 4.0  CL 105 105  --   CO2 29  --   --  GLUCOSE 99 89  --   BUN 31* 33* 26*  CREATININE 0.95 0.90 0.7  CALCIUM 8.7*  --   --     Recent Labs  09/27/15 03/05/16 1730 07/17/16  AST '18 27 21  '$ ALT '13 19 11  '$ ALKPHOS 84 82 77  BILITOT  --  0.5  --   PROT  --  6.4*  --   ALBUMIN  --  3.5  --     Recent Labs  12/13/15 03/05/16 1730 03/30/16 2033 07/17/16  WBC 6.1 5.9  --  6.7  NEUTROABS  --  3.9  --   --   HGB 14.1 12.6 13.3 13.4  HCT  --  39.4 39.0 41  MCV  --  99.5  --   --   PLT 190 173  --  199   Lab Results  Component Value Date   TSH 1.51 07/17/2016   Lab Results  Component Value Date   HGBA1C 5.4 03/08/2015   Lab Results  Component Value Date   CHOL 235 (H) 03/06/2009   HDL 108.40 03/06/2009   LDLDIRECT 108.2 03/06/2009    Significant Diagnostic Results in last 30 days:  No results found.  Assessment/Plan Depression Noted involuntary lip smacking and mild tremor in hands, decrease Cymbalta to '30mg'$  qd, observe.   Dementia 05/19/14 MMSE 27/30, continue Aricept and Namenda  Seizure disorder ([place]) Last seizure 2010 09/27/15 Dilantin lever 11(10-20) Continue Dilantin '100mg'$  am, '200mg'$  hs  Back pain livable, continue Mobic 7.'5mg'$  daily, Tylenol '650mg'$  bid is adequate, ambulates with walker. Prn Norco available to her.   GERD (gastroesophageal reflux disease) Stable, continue Omeprazole '20mg'$  daily. Observe.   Hypoxemic respiratory failure, chronic (HCC) Stable, continue Ellipta  daily     Family/ staff Communication:   Labs/tests ordered: CBC CMP TSH

## 2016-08-12 NOTE — Assessment & Plan Note (Signed)
Stable, continue Omeprazole '20mg'$  daily. Observe.

## 2016-08-12 NOTE — Assessment & Plan Note (Signed)
Noted involuntary lip smacking and mild tremor in hands, decrease Cymbalta to '30mg'$  qd, observe.

## 2016-08-12 NOTE — Assessment & Plan Note (Signed)
05/19/14 MMSE 27/30, continue Aricept and Namenda 

## 2016-09-01 DIAGNOSIS — R251 Tremor, unspecified: Secondary | ICD-10-CM | POA: Diagnosis not present

## 2016-09-08 ENCOUNTER — Encounter: Payer: Self-pay | Admitting: Internal Medicine

## 2016-09-08 ENCOUNTER — Non-Acute Institutional Stay: Payer: Medicare Other | Admitting: Internal Medicine

## 2016-09-08 DIAGNOSIS — E039 Hypothyroidism, unspecified: Secondary | ICD-10-CM

## 2016-09-08 DIAGNOSIS — F320 Major depressive disorder, single episode, mild: Secondary | ICD-10-CM | POA: Diagnosis not present

## 2016-09-08 DIAGNOSIS — R2681 Unsteadiness on feet: Secondary | ICD-10-CM | POA: Diagnosis not present

## 2016-09-08 DIAGNOSIS — E785 Hyperlipidemia, unspecified: Secondary | ICD-10-CM

## 2016-09-08 DIAGNOSIS — F0390 Unspecified dementia without behavioral disturbance: Secondary | ICD-10-CM

## 2016-09-08 DIAGNOSIS — M25551 Pain in right hip: Secondary | ICD-10-CM

## 2016-09-08 DIAGNOSIS — G40909 Epilepsy, unspecified, not intractable, without status epilepticus: Secondary | ICD-10-CM | POA: Diagnosis not present

## 2016-09-08 NOTE — Progress Notes (Signed)
Progress Note    Location:  [place] Room Number: [place] of Service:  ALF [phone]) Provider:  Jeanmarie Hubert, MD  Patient Care Team: Estill Dooms, MD as PCP - General (Internal Medicine) [facility] [name], MD as Consulting Physician (Orthopedic Surgery) [name], MD as Consulting Physician (Physical Medicine and Rehabilitation) Melina Schools, MD as Consulting Physician (Orthopedic Surgery) [name], MD as Consulting Physician (Otolaryngology) Man Otho Darner, NP as Nurse Practitioner (Internal Medicine)  Extended Emergency Contact Information Primary Emergency Contact: Brandis,[name] Address: [address], [place] Phone: [phone] Mobile Phone: [phone] Relation: Son Secondary Emergency Contact: [name] [place] Phone: [phone] Mobile Phone: [phone] Relation: Daughter  Code Status:  DNR Goals of care: Advanced Directive information Advanced Directives 09/08/2016  Does Patient Have a Medical Advance Directive? Yes  Type of Paramedic of [place];Living will;Out of facility DNR (pink MOST or yellow form)  Does patient want to make changes to medical advance directive? -  Copy of [place] in Chart? Yes  Pre-existing out of facility DNR order (yellow form or pink MOST form) Yellow form placed in chart (order not valid for inpatient use)     Chief Complaint  Patient presents with  . Medical Management of Chronic Issues    routine visit    HPI:  Pt is a 83 y.o. female seen today for medical management of chronic diseases.    Dementia without behavioral disturbance, unspecified dementia type - unchanged on current medication.  Current mild episode of major depressive disorder without prior episode ([place]) - contines to express sadness about [place] in the [place].  Hypothyroidism, unspecified  type - compensated  Dyslipidemia - controlled  Seizure disorder ([place]) - it has been several months since her last seizure  Unstable gait - using rolling walker  Right hip pain - improved     Past Medical History:  Diagnosis Date  . Anemia, unspecified 03/18/2011  . Anxiety state, unspecified 01/2011  . Blood in stool 06/15/2012  . Corns and callosities 07/01/2011  . Diverticulosis 02/2011  . Dizziness   . DJD (degenerative joint disease)   . Dysuria 06/17/2011  . Hemorrhoids 02/2011  . Low back pain   . Mitral valve problem thickened   thickened  . Osteoarthritis of both hands 10/09/2015  . Osteoporosis 02/2011  . Other abnormal blood chemistry 0/30/2012  . Other acquired deformity of toe 12/16/2011  . Pain in joint, site unspecified 01/2012  . 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 Sacroiliitis, not elsewhere classified ([place]) 05/2011  . Seizures ([place])    Remotely  . Unspecified hypothyroidism 01/2011  . 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 Vitamin D deficiency 01/2011   Past Surgical History:  Procedure Laterality Date  . bletheroplasty    . CATARACT EXTRACTION W/ INTRAOCULAR LENS  IMPLANT, BILATERAL  2010  . CERVICAL LAMINECTOMY  2005  . HAMMER TOE SURGERY  2013   right 2nd toe Dr. Mallie Mussel  . JOINT REPLACEMENT Bilateral 2002 Right, 1992 Left   knees    Allergies  Allergen Reactions  . Macrodantin [Nitrofurantoin]   . Morphine And Related Other (See Comments)    Altered mental state   . Sulfa Antibiotics Other (See Comments)    Reaction: unknown    Outpatient Encounter  Prescriptions as of 09/08/2016  Medication Sig  . acetaminophen (TYLENOL) 325 MG tablet Take 325 mg by mouth. Take 2 tablets twice daily  . amoxicillin (AMOXIL) 500 MG capsule Take 2,000 mg by mouth as directed. 1 Hour Before Procedure.  . Calcium Carbonate-Vitamin D (CALCIUM 600+D) 600-400 MG-UNIT tablet Take 1 tablet by mouth daily.  .  Cholecalciferol (VITAMIN D3) 5000 units TABS Take 1 tablet by mouth daily. Take one tablet daily   . clonazePAM (KLONOPIN) 0.5 MG tablet Take one tablet by mouth 2 times a day at 8 am and 8 pm.  . dextromethorphan-guaiFENesin (ROBAFEN DM CGH/CHEST CONGEST) 10-100 MG/5ML liquid Take by mouth.  . donepezil (ARICEPT) 10 MG tablet Take 10 mg by mouth daily.  . DULoxetine (CYMBALTA) 30 MG capsule Take 30 mg by mouth. Take one tablet daily  . fluticasone furoate-vilanterol (BREO ELLIPTA) 100-25 MCG/INH AEPB Inhale 1 puff into the lungs daily.  Marland Kitchen guaiFENesin (MUCINEX) 600 MG 12 hr tablet Take by mouth 2 (two) times daily.  Marland Kitchen levothyroxine (SYNTHROID, LEVOTHROID) 100 MCG tablet Take 1 tablet (100 mcg total) by mouth daily before breakfast. For thyroid  . meloxicam (MOBIC) 7.5 MG tablet Take 7.5 mg by mouth daily.  . memantine (NAMENDA) 10 MG tablet Take 10 mg by mouth 2 (two) times daily.  . Multiple Vitamin (TAB-A-VITE PO) Take 1 tablet by mouth daily.  . Multiple Vitamins-Minerals (THERA M PLUS PO) Take by mouth. Take 9 mg once daily  . Omega-3 1000 MG CAPS Take 1 capsule by mouth daily. Take one capsule every morning   . omeprazole (PRILOSEC) 20 MG capsule Take 20 mg by mouth at bedtime.  . phenytoin (DILANTIN) 100 MG ER capsule Take 100-200 mg by mouth 2 (two) times daily. Take one capsule every morning; take 2 capsules at bedtime   . triamcinolone cream (KENALOG) 0.1 %   . trolamine salicylate (ASPERCREME) 10 % cream Apply 1 application topically. Apply as needed  . [DISCONTINUED] citalopram (CELEXA) 10 MG tablet Take 10 mg by mouth. Take one tablet daily for anxiety  . [DISCONTINUED] HYDROcodone-acetaminophen (NORCO/VICODIN) 5-325 MG tablet Take by mouth. Take 1/2 tablet twice a day as needed for pain   No facility-administered encounter medications on file as of 09/08/2016.     Review of Systems  Constitutional: Negative for activity change, appetite change, chills, diaphoresis, fatigue, fever  and unexpected weight change.  HENT: Positive for hearing loss. Negative for congestion, ear discharge, ear pain, nosebleeds, postnasal drip, rhinorrhea, sore throat, tinnitus, trouble swallowing and voice change.        Bilateral hearing loss. June 2015 ruptured right TM  Eyes: Negative.  Negative for pain, redness, itching and visual disturbance.  Respiratory: Negative for cough, choking, shortness of breath and wheezing.        History of dyspnea on exertion. Remains on oxygen 24/7 at this time. History of pulmonary embolism and [place] filter. Remains on warfarin.  Cardiovascular: Negative.  Negative for chest pain, palpitations and leg swelling.  Gastrointestinal: Negative for abdominal distention, abdominal pain, constipation, diarrhea and nausea.  Endocrine: Negative for cold intolerance, heat intolerance, polydipsia, polyphagia and polyuria.  Genitourinary: Positive for frequency. Negative for difficulty urinating, dysuria, flank pain, hematuria, pelvic pain, urgency and vaginal discharge.       2-3x/night  Musculoskeletal: Positive for arthralgias (both hands), back pain and gait problem (unstable using walker). Negative for myalgias, neck pain and neck stiffness.       Osteoarthritis with complaints of pain in multiple  areas. Neck and right shoulder pains.  [place] 05/20/15 C/o back pain C/o right hip pain  Skin: Negative for color change, pallor and rash (on neck).       Senile ecchymoses.   Allergic/Immunologic: Negative.  Negative for environmental allergies.  Neurological: Positive for weakness. Negative for dizziness, tremors, seizures, syncope, numbness and headaches.       Hx seizures.  Head trauma sustained in a fall on 10/05/27/15.  Hematological: Negative for adenopathy. Does not bruise/bleed easily.  Psychiatric/Behavioral: Positive for confusion, decreased concentration and dysphoric mood. Negative for agitation, behavioral problems, hallucinations, sleep disturbance and  suicidal ideas. The patient is nervous/anxious. The patient is not hyperactive.        Chronic anxiety. Memory deficits.    Immunization History  Administered Date(s) Administered  . Influenza Whole 03/17/2007, 02/16/2009, 01/30/2010, 02/16/2013  . Influenza-Unspecified 03/02/2014, 02/22/2015, 02/28/2016  . PPD Test 07/09/2010  . Pneumococcal Polysaccharide-23 05/19/2005, 05/23/2013  . Td 05/19/2005  . Tetanus 10/06/2012   Pertinent  Health Maintenance Due  Topic Date Due  . DEXA SCAN  11/19/1994  . PNA vac Low Risk Adult (2 of 2 - PCV13) 05/23/2014  . INFLUENZA VACCINE  12/17/2016   Fall Risk  10/09/2015 07/10/2015 03/26/2015 12/19/2014 03/22/2013  Falls in the past year? No Yes Yes Yes Yes  Number falls in past yr: - '1 1 1 2 '$ or more  Injury with Fall? - Yes No Yes -   Functional Status Survey:    Vitals:   09/08/16 1348  BP: 109/73  Pulse: 79  Resp: 18  Temp: 97.5 F (36.4 C)  SpO2: 98%  Weight: 140 lb (63.5 kg)  Height: 5' (1.524 m)   Body mass index is 27.34 kg/m. Physical Exam  Constitutional: She is oriented to person, place, and time. She appears well-developed and well-nourished. No distress.  HENT:  Right Ear: External ear normal.  Left Ear: External ear normal.  Nose: Nose normal.  Mouth/Throat: Oropharynx is clear and moist. No oropharyngeal exudate.  Bilateral hearing loss. Rupture of the right TM. Torus palatinus.   Eyes: Conjunctivae and EOM are normal. Pupils are equal, round, and reactive to light. No scleral icterus.  Lens implants in both eyes  Neck: Normal range of motion. Neck supple. No JVD present. No tracheal deviation present. No thyromegaly present.  Cardiovascular: Normal rate, regular rhythm, normal heart sounds and intact distal pulses.  Exam reveals no gallop and no friction rub.   No murmur heard. Pulmonary/Chest: Effort normal and breath sounds normal. No respiratory distress. She has no wheezes. She has no rales. She exhibits no  tenderness.  Increased breath sounds posterior lungs.   Abdominal: Soft. Bowel sounds are normal. She exhibits no distension and no mass. There is no tenderness.  Genitourinary: Rectal exam shows guaiac negative stool.  Genitourinary Comments: From previous exam: Hemorrhoids. Normal sphincter tone. Stools are normal brown color.  Musculoskeletal: Normal range of motion. She exhibits tenderness and deformity. She exhibits no edema.  Tender in the posterior neck. Mild restriction in movements. Hammertoes at the left second and third. Right second toe overlaps the great toe. SP surgical repair of the right 2nd toe.  Shoulders sore. Painful in the right shoulder when she lifts overhead and rotates at the shpoulder.[facility] July 2014. Pain in both hands and significant deformities related to OA.  Unstable gait. Using walker with 2 front wheels and rear skids. Weaker grip of the right hand.. Tender in the left SI joint. Tender in all enlarged  finger joints.  Pain in the right hip, X-ray 03/10/16 no acute fx  Lymphadenopathy:    She has no cervical adenopathy.  Neurological: She is alert and oriented to person, place, and time. No cranial nerve deficit. Coordination normal.  No loss of grip strength. Loss of memory. 04/04/14 MMSE 25/30. Passed clock drawing. 07/04/14 MMSE 28/30. Failed clock drawing. Bilateral tremor.  Skin: Skin is warm and dry. No rash (erythematous blotches on the neck anteriorly) noted. She is not diaphoretic. No erythema. No pallor.     Psychiatric: She has a normal mood and affect. Her behavior is normal. Judgment and thought content normal.  depressed    Labs reviewed:  Recent Labs  03/05/16 1730 03/30/16 2033 07/17/16  NA 140 139 144  K 4.8 4.6 4.0  CL 105 105  --   CO2 29  --   --   GLUCOSE 99 89  --   BUN 31* 33* 26*  CREATININE 0.95 0.90 0.7  CALCIUM 8.7*  --   --     Recent Labs  09/27/15 03/05/16 1730 07/17/16  AST '18 27 21  '$ ALT '13 19 11  '$ ALKPHOS  84 82 77  BILITOT  --  0.5  --   PROT  --  6.4*  --   ALBUMIN  --  3.5  --     Recent Labs  12/13/15 03/05/16 1730 03/30/16 2033 07/17/16  WBC 6.1 5.9  --  6.7  NEUTROABS  --  3.9  --   --   HGB 14.1 12.6 13.3 13.4  HCT  --  39.4 39.0 41  MCV  --  99.5  --   --   PLT 190 173  --  199   Lab Results  Component Value Date   TSH 1.51 07/17/2016   Lab Results  Component Value Date   HGBA1C 5.4 03/08/2015   Lab Results  Component Value Date   CHOL 235 (H) 03/06/2009   HDL 108.40 03/06/2009   LDLDIRECT 108.2 03/06/2009   Assessment/Plan 1. Dementia without behavioral disturbance, unspecified dementia type The current medical regimen is effective;  continue present plan and medications.  2. Current mild episode of major depressive disorder without prior episode [facility]) The current medical regimen is effective;  continue present plan and medications.  3. Hypothyroidism, unspecified type The current medical regimen is effective;  continue present plan and medications.  4. Dyslipidemia The current medical regimen is effective;  continue present plan and medications.  5. Seizure disorder ([place]) The current medical regimen is effective;  continue present plan and medications.  6. Unstable gait Continue walker  7. Right hip pain May be resolved.

## 2016-09-16 ENCOUNTER — Non-Acute Institutional Stay: Payer: Medicare Other | Admitting: Internal Medicine

## 2016-09-16 ENCOUNTER — Encounter: Payer: Self-pay | Admitting: Internal Medicine

## 2016-09-16 VITALS — BP 100/64 | HR 75 | Temp 97.6°F | Ht 60.0 in | Wt 141.0 lb

## 2016-09-16 DIAGNOSIS — F0390 Unspecified dementia without behavioral disturbance: Secondary | ICD-10-CM | POA: Diagnosis not present

## 2016-09-16 DIAGNOSIS — E785 Hyperlipidemia, unspecified: Secondary | ICD-10-CM

## 2016-09-16 DIAGNOSIS — E039 Hypothyroidism, unspecified: Secondary | ICD-10-CM | POA: Diagnosis not present

## 2016-09-16 DIAGNOSIS — F320 Major depressive disorder, single episode, mild: Secondary | ICD-10-CM | POA: Diagnosis not present

## 2016-09-16 DIAGNOSIS — K219 Gastro-esophageal reflux disease without esophagitis: Secondary | ICD-10-CM

## 2016-09-16 DIAGNOSIS — G40909 Epilepsy, unspecified, not intractable, without status epilepticus: Secondary | ICD-10-CM | POA: Diagnosis not present

## 2016-09-16 MED ORDER — METOCLOPRAMIDE HCL 5 MG PO TABS: ORAL_TABLET | ORAL | 3 refills | Status: DC

## 2016-09-16 NOTE — Progress Notes (Signed)
[place] Room Number: AL36  Place of Service: Clinic (12)     Allergies  Allergen Reactions  . Macrodantin [Nitrofurantoin]   . Morphine And Related Other (See Comments)    Altered mental state   . Sulfa Antibiotics Other (See Comments)    Reaction: unknown    Chief Complaint  Patient presents with  . Acute Visit    when she goes to bed at night stomach rumples, then makes her cough.    HPI:  Gastroesophageal reflux disease without esophagitis - stomach rumbles and then she coughs. At other times she may have regurgitation without nausea.  Dementia without behavioral disturbance, unspecified dementia type - continues quite forgetful  Dyslipidemia - controlled  Seizure disorder ([place]) - none in the last year  Hypothyroidism, unspecified type - compensated  Current mild episode of major depressive disorder without prior episode ([place]) - obsesses about the restriction on driving. " Bored". Using Cymbalta     Medications: Patient's Medications  New Prescriptions   No medications on file  Previous Medications   ACETAMINOPHEN (TYLENOL) 325 MG TABLET    Take 325 mg by mouth. Take 2 tablets twice daily   AMOXICILLIN (AMOXIL) 500 MG CAPSULE    Take 2,000 mg by mouth as directed. 1 Hour Before Procedure.   CALCIUM CARBONATE-VITAMIN D (CALCIUM 600+D) 600-400 MG-UNIT TABLET    Take 1 tablet by mouth daily.   CHOLECALCIFEROL (VITAMIN D3) 5000 UNITS TABS    Take 1 tablet by mouth daily. Take one tablet daily    CLONAZEPAM (KLONOPIN) 0.5 MG TABLET    Take one tablet by mouth 2 times a day at 8 am and 8 pm.   DEXTROMETHORPHAN-GUAIFENESIN (ROBAFEN DM CGH/CHEST CONGEST) 10-100 MG/5ML LIQUID    Take by mouth.   DONEPEZIL (ARICEPT) 10 MG TABLET    Take 10 mg by mouth daily.   DULOXETINE (CYMBALTA) 30 MG CAPSULE    Take 30 mg by mouth. Take one tablet daily   FLUTICASONE FUROATE-VILANTEROL (BREO ELLIPTA) 100-25 MCG/INH AEPB    Inhale 1 puff into the lungs daily.   GUAIFENESIN (MUCINEX) 600 MG 12 HR TABLET    Take by mouth 2 (two) times daily.   LEVOTHYROXINE (SYNTHROID, LEVOTHROID) 100 MCG TABLET    Take 1 tablet (100 mcg total) by mouth daily before breakfast. For thyroid   MELOXICAM (MOBIC) 7.5 MG TABLET    Take 7.5 mg by mouth daily.   MEMANTINE (NAMENDA) 10 MG TABLET    Take 10 mg by mouth 2 (two) times daily.   MULTIPLE VITAMIN (TAB-A-VITE PO)    Take 1 tablet by mouth daily.   MULTIPLE VITAMINS-MINERALS (THERA M PLUS PO)    Take by mouth. Take 9 mg once daily   OMEGA-3 1000 MG CAPS    Take 1 capsule by mouth daily. Take one capsule every morning    OMEPRAZOLE (PRILOSEC) 20 MG CAPSULE    Take 20 mg by mouth at bedtime.   PHENYTOIN (DILANTIN) 100 MG ER CAPSULE    Take 100-200 mg by mouth 2 (two) times daily. Take one capsule every morning; take 2 capsules at bedtime    TRIAMCINOLONE CREAM (KENALOG) 0.1 %       TROLAMINE SALICYLATE (ASPERCREME) 10 % CREAM    Apply 1 application topically. Apply as needed  Modified Medications   No medications on file  Discontinued Medications   No medications on file     Review of Systems  Constitutional: Negative  for activity change, appetite change, chills, diaphoresis, fatigue, fever and unexpected weight change.  HENT: Positive for hearing loss. Negative for congestion, ear discharge, ear pain, nosebleeds, postnasal drip, rhinorrhea, sore throat, tinnitus, trouble swallowing and voice change.        Bilateral hearing loss. June 2015 ruptured right TM  Eyes: Negative.  Negative for pain, redness, itching and visual disturbance.  Respiratory: Negative for cough, choking, shortness of breath and wheezing.        History of dyspnea on exertion. Remains on oxygen 24/7 at this time. History of pulmonary embolism and [place] filter. Remains on warfarin.  Cardiovascular: Negative.  Negative for chest pain, palpitations and leg swelling.  Gastrointestinal: Negative for abdominal distention, abdominal pain,  constipation, diarrhea and nausea.  Endocrine: Negative for cold intolerance, heat intolerance, polydipsia, polyphagia and polyuria.  Genitourinary: Positive for frequency. Negative for difficulty urinating, dysuria, flank pain, hematuria, pelvic pain, urgency and vaginal discharge.       2-3x/night  Musculoskeletal: Positive for arthralgias (both hands), back pain and gait problem (unstable using walker). Negative for myalgias, neck pain and neck stiffness.       Osteoarthritis with complaints of pain in multiple areas. Neck and right shoulder pains.  [place] 05/20/15 C/o back pain C/o right hip pain  Skin: Negative for color change, pallor and rash (on neck).       Senile ecchymoses.   Allergic/Immunologic: Negative.  Negative for environmental allergies.  Neurological: Positive for weakness. Negative for dizziness, tremors, seizures, syncope, numbness and headaches.       Hx seizures.  Head trauma sustained in a fall on 10/05/27/15.  Hematological: Negative for adenopathy. Does not bruise/bleed easily.  Psychiatric/Behavioral: Positive for confusion, decreased concentration and dysphoric mood. Negative for agitation, behavioral problems, hallucinations, sleep disturbance and suicidal ideas. The patient is nervous/anxious. The patient is not hyperactive.        Chronic anxiety. Memory deficits.    Vitals:   09/16/16 1008  BP: 100/64  Pulse: 75  Temp: 97.6 F (36.4 C)  TempSrc: Oral  SpO2: 92%  Weight: 141 lb (64 kg)  Height: 5' (1.524 m)   Wt Readings from Last 3 Encounters:  09/16/16 141 lb (64 kg)  09/08/16 140 lb (63.5 kg)  07/15/16 140 lb (63.5 kg)    Body mass index is 27.54 kg/m.  Physical Exam  Constitutional: She is oriented to person, place, and time. She appears well-developed and well-nourished. No distress.  HENT:  Right Ear: External ear normal.  Left Ear: External ear normal.  Nose: Nose normal.  Mouth/Throat: Oropharynx is clear and moist. No oropharyngeal  exudate.  Bilateral hearing loss. Rupture of the right TM. Torus palatinus.   Eyes: Conjunctivae and EOM are normal. Pupils are equal, round, and reactive to light. No scleral icterus.  Lens implants in both eyes  Neck: Normal range of motion. Neck supple. No JVD present. No tracheal deviation present. No thyromegaly present.  Cardiovascular: Normal rate, regular rhythm, normal heart sounds and intact distal pulses.  Exam reveals no gallop and no friction rub.   No murmur heard. Pulmonary/Chest: Effort normal and breath sounds normal. No respiratory distress. She has no wheezes. She has no rales. She exhibits no tenderness.  Increased breath sounds posterior lungs.   Abdominal: Soft. Bowel sounds are normal. She exhibits no distension and no mass. There is no tenderness.  Genitourinary: Rectal exam shows guaiac negative stool.  Genitourinary Comments: From previous exam: Hemorrhoids. Normal sphincter tone. Stools are normal brown  color.  Musculoskeletal: Normal range of motion. She exhibits tenderness and deformity. She exhibits no edema.  Tender in the posterior neck. Mild restriction in movements. Hammertoes at the left second and third. Right second toe overlaps the great toe. SP surgical repair of the right 2nd toe.  Shoulders sore. Painful in the right shoulder when she lifts overhead and rotates at the shpoulder.[facility] July 2014. Pain in both hands and significant deformities related to OA.  Unstable gait. Using walker with 2 front wheels and rear skids. Weaker grip of the right hand.. Tender in the left SI joint. Tender in all enlarged finger joints.  Pain in the right hip, X-ray 03/10/16 no acute fx  Lymphadenopathy:    She has no cervical adenopathy.  Neurological: She is alert and oriented to person, place, and time. No cranial nerve deficit. Coordination normal.  No loss of grip strength. Loss of memory. 04/04/14 MMSE 25/30. Passed clock drawing. 07/04/14 MMSE 28/30. Failed clock  drawing. Bilateral tremor.  Skin: Skin is warm and dry. No rash (erythematous blotches on the neck anteriorly) noted. She is not diaphoretic. No erythema. No pallor.     Psychiatric: She has a normal mood and affect. Her behavior is normal. Judgment and thought content normal.  depressed     Labs reviewed: Lab Summary Latest Ref Rng & Units 07/17/2016 03/30/2016 03/05/2016  Hemoglobin 12.0 - 16.0 g/dL 13.4 13.3 12.6  Hematocrit 36 - 46 % 41 39.0 39.4  White count 10:3/mL 6.7 (None) 5.9  Platelet count 150 - 399 K/L 199 (None) 173  Sodium 137 - 147 mmol/L 144 139 140  Potassium 3.4 - 5.3 mmol/L 4.0 4.6 4.8  Calcium 8.9 - 10.3 mg/dL (None) (None) 8.7(L)  Phosphorus - (None) (None) (None)  Creatinine 0.5 - 1.1 mg/dL 0.7 0.90 0.95  AST 13 - 35 U/L 21 (None) 27  Alk Phos 25 - 125 U/L 77 (None) 82  Bilirubin 0.3 - 1.2 mg/dL (None) (None) 0.5  Glucose mg/dL 87 89 99  Cholesterol - (None) (None) (None)  HDL cholesterol - (None) (None) (None)  Triglycerides - (None) (None) (None)  LDL Direct - (None) (None) (None)  LDL Calc - (None) (None) (None)  Total protein 6.5 - 8.1 g/dL (None) (None) 6.4(L)  Albumin 3.5 - 5.0 g/dL (None) (None) 3.5  Some recent data might be hidden   Lab Results  Component Value Date   TSH 1.51 07/17/2016   Lab Results  Component Value Date   BUN 26 (A) 07/17/2016   BUN 33 (H) 03/30/2016   BUN 31 (H) 03/05/2016   Lab Results  Component Value Date   CREATININE 0.7 07/17/2016   CREATININE 0.90 03/30/2016   CREATININE 0.95 03/05/2016   Lab Results  Component Value Date   HGBA1C 5.4 03/08/2015   HGBA1C 5.5 11/21/2013   HGBA1C 5.4 08/08/2013       Assessment/Plan  1. Gastroesophageal reflux disease without esophagitis - metoCLOPramide (REGLAN) 5 MG tablet; One tablet before each meal to help reflux  Dispense: 90 tablet; Refill: 3  2. Dementia without behavioral disturbance, unspecified dementia type Unchanged. Continue donepezil and  memantine.  3. Dyslipidemia controlled  4. Seizure disorder ([place]) controlled  5. Hypothyroidism, unspecified type The current medical regimen is effective;  continue present plan and medications.  6. Current mild episode of major depressive disorder without prior episode [facility]) The current medical regimen is effective;  continue present plan and medications.

## 2016-10-01 ENCOUNTER — Encounter: Payer: Self-pay | Admitting: Internal Medicine

## 2016-10-17 ENCOUNTER — Non-Acute Institutional Stay: Payer: Medicare Other | Admitting: Nurse Practitioner

## 2016-10-17 ENCOUNTER — Encounter: Payer: Self-pay | Admitting: Nurse Practitioner

## 2016-10-17 DIAGNOSIS — K219 Gastro-esophageal reflux disease without esophagitis: Secondary | ICD-10-CM

## 2016-10-17 DIAGNOSIS — F320 Major depressive disorder, single episode, mild: Secondary | ICD-10-CM | POA: Diagnosis not present

## 2016-10-17 DIAGNOSIS — M150 Primary generalized (osteo)arthritis: Secondary | ICD-10-CM | POA: Diagnosis not present

## 2016-10-17 DIAGNOSIS — I2601 Septic pulmonary embolism with acute cor pulmonale: Secondary | ICD-10-CM

## 2016-10-17 DIAGNOSIS — M5440 Lumbago with sciatica, unspecified side: Secondary | ICD-10-CM | POA: Diagnosis not present

## 2016-10-17 DIAGNOSIS — F0390 Unspecified dementia without behavioral disturbance: Secondary | ICD-10-CM

## 2016-10-17 DIAGNOSIS — E039 Hypothyroidism, unspecified: Secondary | ICD-10-CM

## 2016-10-17 DIAGNOSIS — G40909 Epilepsy, unspecified, not intractable, without status epilepticus: Secondary | ICD-10-CM | POA: Diagnosis not present

## 2016-10-17 DIAGNOSIS — M159 Polyosteoarthritis, unspecified: Secondary | ICD-10-CM

## 2016-10-17 NOTE — Assessment & Plan Note (Signed)
Continue Levothyroxine 110mcg, TSH 1.51 07/17/16

## 2016-10-17 NOTE — Assessment & Plan Note (Signed)
05/19/14 MMSE 27/30, continue Aricept and Namenda

## 2016-10-17 NOTE — Assessment & Plan Note (Signed)
Hx of it, off Eliquis. Taking Bero daily, Mucinex bid.

## 2016-10-17 NOTE — Assessment & Plan Note (Signed)
livable, continue Mobic 7.5mg  daily, Tylenol 650mg  bid is adequate, ambulates with walker

## 2016-10-17 NOTE — Assessment & Plan Note (Signed)
livable, continue Mobic 7.5mg  daily, Tylenol 650mg  to bid

## 2016-10-17 NOTE — Assessment & Plan Note (Signed)
Stable, continue Omeprazole 20mg  daily, Reglan ac meals, observe.

## 2016-10-17 NOTE — Assessment & Plan Note (Addendum)
Noted involuntary lip smacking and mild tremor in hands, decreased Cymbalta to 30mg  qd, observe.

## 2016-10-17 NOTE — Progress Notes (Signed)
Location:  [place] Room Number: 36 Place of Service:  ALF [id number]) Provider:  Nana Vastine, [place]  NP  Estill Dooms, MD  Patient Care Team: Estill Dooms, MD as PCP - General (Internal Medicine) [name], [facility] [name], MD as Consulting Physician (Orthopedic Surgery) [name], MD as Consulting Physician (Physical Medicine and Rehabilitation) Melina Schools, MD as Consulting Physician (Orthopedic Surgery) [name], MD as Consulting Physician (Otolaryngology) Tzirel Leonor X, NP as Nurse Practitioner (Internal Medicine)  Extended Emergency Contact Information Primary Emergency Contact: Feenstra,[name] Address: [address], [place] Phone: [phone] Mobile Phone: [phone] Relation: Son Secondary Emergency Contact: [name] [place] Phone: [phone] Mobile Phone: [phone] Relation: Daughter  Code Status: DNR Goals of care: Advanced Directive information Advanced Directives 10/17/2016  Does Patient Have a Medical Advance Directive? Yes  Type of Paramedic of [place];Living will;Out of facility DNR (pink MOST or yellow form)  Does patient want to make changes to medical advance directive? No - Patient declined  Copy of [place] in Chart? Yes  Pre-existing out of facility DNR order (yellow form or pink MOST form) Yellow form placed in chart (order not valid for inpatient use)     Chief Complaint  Patient presents with  . Medical Management of Chronic Issues    HPI:  Pt is a 83 y.o. female seen today for medical management of chronic diseases.     Hx of Seizure, on Dilantin 100mg  am 200mg  hs, no active seizure activity since last visited, her mood is managed with Cymbalta 30mg , clonazepam bid, hypothyroidism, on Levothyroxine 159mcg, last TSH 1.51 07/17/16. Dementia, resides at AL, takign Aricept and Namenda, forgetful.  Hx of PE, offEliquis. Lower back pain, taking Mobic 7.5mg , Tylenol 325mg  bid. GERD taking Reglan ac, Omeprazole.   Past Medical History:  Diagnosis Date  . Anemia, unspecified 03/18/2011  . Anxiety state, unspecified 01/2011  . Blood in stool 06/15/2012  . Corns and callosities 07/01/2011  . Diverticulosis 02/2011  . Dizziness   . DJD (degenerative joint disease)   . Dysuria 06/17/2011  . Hemorrhoids 02/2011  . Low back pain   . Mitral valve problem thickened   thickened  . Osteoarthritis of both hands 10/09/2015  . Osteoporosis 02/2011  . Other abnormal blood chemistry 0/30/2012  . Other acquired deformity of toe 12/16/2011  . Pain in joint, site unspecified 01/2012  . 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 Sacroiliitis, not elsewhere classified ([place]) 05/2011  . Seizures ([place])    Remotely  . Unspecified hypothyroidism 01/2011  . 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 Vitamin D deficiency 01/2011   Past Surgical History:  Procedure Laterality Date  . bletheroplasty    . CATARACT EXTRACTION W/ INTRAOCULAR LENS  IMPLANT, BILATERAL  2010  . CERVICAL LAMINECTOMY  2005  . HAMMER TOE SURGERY  2013   right 2nd toe Dr. Mallie Mussel  . JOINT REPLACEMENT Bilateral 2002 Right, 1992 Left   knees    Allergies  Allergen Reactions  . Macrodantin [Nitrofurantoin]   . Morphine And Related Other (See Comments)    Altered mental state   . Sulfa Antibiotics Other (See Comments)    Reaction: unknown    Outpatient Encounter Prescriptions as of 10/17/2016  Medication Sig  . acetaminophen (TYLENOL) 325 MG tablet Take 325 mg by  mouth. Take 2 tablets twice daily  . amoxicillin (AMOXIL) 500 MG capsule Take 2,000 mg by mouth as directed. 1 Hour Before Procedure.  . Calcium Carbonate-Vitamin D (CALCIUM 600+D) 600-400 MG-UNIT tablet Take 1 tablet by mouth daily.  . Cholecalciferol (VITAMIN D3) 5000 units TABS Take 1 tablet by mouth daily. Take one tablet  daily   . clonazePAM (KLONOPIN) 0.5 MG tablet Take one tablet by mouth 2 times a day at 8 am and 8 pm.  . dextromethorphan-guaiFENesin (ROBAFEN DM CGH/CHEST CONGEST) 10-100 MG/5ML liquid Take by mouth.  . donepezil (ARICEPT) 10 MG tablet Take 10 mg by mouth daily.  . DULoxetine (CYMBALTA) 30 MG capsule Take 30 mg by mouth. Take one tablet daily  . guaiFENesin (MUCINEX) 600 MG 12 hr tablet Take by mouth 2 (two) times daily.  Marland Kitchen levothyroxine (SYNTHROID, LEVOTHROID) 100 MCG tablet Take 1 tablet (100 mcg total) by mouth daily before breakfast. For thyroid  . meloxicam (MOBIC) 7.5 MG tablet Take 7.5 mg by mouth daily.  . memantine (NAMENDA) 10 MG tablet Take 10 mg by mouth 2 (two) times daily.  . metoCLOPramide (REGLAN) 5 MG tablet One tablet before each meal to help reflux  . Multiple Vitamin (TAB-A-VITE PO) Take 1 tablet by mouth daily.  . Multiple Vitamins-Minerals (THERA M PLUS PO) Take by mouth. Take 9 mg once daily  . Omega-3 1000 MG CAPS Take 1 capsule by mouth daily. Take one capsule every morning   . omeprazole (PRILOSEC) 20 MG capsule Take 20 mg by mouth at bedtime.  . phenytoin (DILANTIN) 100 MG ER capsule Take 100-200 mg by mouth 2 (two) times daily. Take one capsule every morning; take 2 capsules at bedtime   . triamcinolone cream (KENALOG) 0.1 %   . trolamine salicylate (ASPERCREME) 10 % cream Apply 1 application topically. Apply as needed  . fluticasone furoate-vilanterol (BREO ELLIPTA) 100-25 MCG/INH AEPB Inhale 1 puff into the lungs daily.   No facility-administered encounter medications on file as of 10/17/2016.     Review of Systems  Constitutional: Negative for activity change, appetite change, chills, diaphoresis, fatigue, fever and unexpected weight change.  HENT: Positive for hearing loss. Negative for congestion, ear discharge, ear pain, nosebleeds, postnasal drip, rhinorrhea, sore throat, tinnitus, trouble swallowing and voice change.        Bilateral hearing loss. June  2015 ruptured right TM  Eyes: Negative.  Negative for pain, redness, itching and visual disturbance.  Respiratory: Negative for cough, choking, shortness of breath and wheezing.        History of dyspnea on exertion. Remains on oxygen 24/7 at this time. History of pulmonary embolism and [place] filter. Remains on warfarin.  Cardiovascular: Negative.  Negative for chest pain, palpitations and leg swelling.  Gastrointestinal: Negative for abdominal distention, abdominal pain, constipation, diarrhea and nausea.  Endocrine: Negative for cold intolerance, heat intolerance, polydipsia, polyphagia and polyuria.  Genitourinary: Positive for frequency. Negative for difficulty urinating, dysuria, flank pain, hematuria, pelvic pain, urgency and vaginal discharge.       2-3x/night  Musculoskeletal: Positive for arthralgias (both hands), back pain and gait problem (unstable using walker). Negative for myalgias, neck pain and neck stiffness.       Osteoarthritis with complaints of pain in multiple areas. Neck and right shoulder pains.  [place] 05/20/15 C/o back pain C/o right hip pain  Skin: Negative for color change, pallor and rash (on neck).       Senile ecchymoses.   Allergic/Immunologic: Negative.  Negative for environmental  allergies.  Neurological: Positive for weakness. Negative for dizziness, tremors, seizures, syncope, numbness and headaches.       Hx seizures.  Head trauma sustained in a fall on 10/05/27/15.  Hematological: Negative for adenopathy. Does not bruise/bleed easily.  Psychiatric/Behavioral: Positive for confusion, decreased concentration and dysphoric mood. Negative for agitation, behavioral problems, hallucinations, sleep disturbance and suicidal ideas. The patient is nervous/anxious. The patient is not hyperactive.        Chronic anxiety. Memory deficits.    Immunization History  Administered Date(s) Administered  . Influenza Whole 03/17/2007, 02/16/2009, 01/30/2010, 02/16/2013    . Influenza-Unspecified 03/02/2014, 02/22/2015, 02/28/2016  . PPD Test 07/09/2010  . Pneumococcal Polysaccharide-23 05/19/2005, 05/23/2013  . Td 05/19/2005  . Tetanus 10/06/2012   Pertinent  Health Maintenance Due  Topic Date Due  . DEXA SCAN  11/19/1994  . PNA vac Low Risk Adult (2 of 2 - PCV13) 05/23/2014  . INFLUENZA VACCINE  12/17/2016   Fall Risk  10/09/2015 07/10/2015 03/26/2015 12/19/2014 03/22/2013  Falls in the past year? No Yes Yes Yes Yes  Number falls in past yr: - 1 1 1 2  or more  Injury with Fall? - Yes No Yes -   Functional Status Survey:    Vitals:   10/17/16 1404  BP: (!) 105/56  Pulse: 71  Resp: 20  Temp: 97.5 F (36.4 C)  SpO2: 90%  Weight: 143 lb 3.2 oz (65 kg)  Height: 5' (1.524 m)   Body mass index is 27.97 kg/m. Physical Exam  Constitutional: She is oriented to person, place, and time. She appears well-developed and well-nourished. No distress.  HENT:  Right Ear: External ear normal.  Left Ear: External ear normal.  Nose: Nose normal.  Mouth/Throat: Oropharynx is clear and moist. No oropharyngeal exudate.  Bilateral hearing loss. Rupture of the right TM. Torus palatinus.   Eyes: Conjunctivae and EOM are normal. Pupils are equal, round, and reactive to light. No scleral icterus.  Lens implants in both eyes  Neck: Normal range of motion. Neck supple. No JVD present. No tracheal deviation present. No thyromegaly present.  Cardiovascular: Normal rate, regular rhythm, normal heart sounds and intact distal pulses.  Exam reveals no gallop and no friction rub.   No murmur heard. Pulmonary/Chest: Effort normal and breath sounds normal. No respiratory distress. She has no wheezes. She has no rales. She exhibits no tenderness.  Increased breath sounds posterior lungs.   Abdominal: Soft. Bowel sounds are normal. She exhibits no distension and no mass. There is no tenderness.  Genitourinary: Rectal exam shows guaiac negative stool.  Genitourinary Comments: From  previous exam: Hemorrhoids. Normal sphincter tone. Stools are normal brown color.  Musculoskeletal: Normal range of motion. She exhibits tenderness and deformity. She exhibits no edema.  Tender in the posterior neck. Mild restriction in movements. Hammertoes at the left second and third. Right second toe overlaps the great toe. SP surgical repair of the right 2nd toe.  Shoulders sore. Painful in the right shoulder when she lifts overhead and rotates at the [place].[facility] July 2014. Pain in both hands and significant deformities related to OA.  Unstable gait. Using walker with 2 front wheels and rear skids. Weaker grip of the right hand.. Tender in the left SI joint. Tender in all enlarged finger joints.  Pain in the right hip, X-ray 03/10/16 no acute fx  Lymphadenopathy:    She has no cervical adenopathy.  Neurological: She is alert and oriented to person, place, and time. No cranial nerve deficit.  Coordination normal.  No loss of grip strength. Loss of memory. 04/04/14 MMSE 25/30. Passed clock drawing. 07/04/14 MMSE 28/30. Failed clock drawing. Bilateral tremor.  Skin: Skin is warm and dry. No rash (erythematous blotches on the neck anteriorly) noted. She is not diaphoretic. No erythema. No pallor.     Psychiatric: She has a normal mood and affect. Her behavior is normal. Judgment and thought content normal.  depressed    Labs reviewed:  Recent Labs  03/05/16 1730 03/30/16 2033 07/17/16  NA 140 139 144  K 4.8 4.6 4.0  CL 105 105  --   CO2 29  --   --   GLUCOSE 99 89  --   BUN 31* 33* 26*  CREATININE 0.95 0.90 0.7  CALCIUM 8.7*  --   --     Recent Labs  03/05/16 1730 07/17/16  AST 27 21  ALT 19 11  ALKPHOS 82 77  BILITOT 0.5  --   PROT 6.4*  --   ALBUMIN 3.5  --     Recent Labs  12/13/15 03/05/16 1730 03/30/16 2033 07/17/16  WBC 6.1 5.9  --  6.7  NEUTROABS  --  3.9  --   --   HGB 14.1 12.6 13.3 13.4  HCT  --  39.4 39.0 41  MCV  --  99.5  --   --   PLT 190  173  --  199   Lab Results  Component Value Date   TSH 1.51 07/17/2016   Lab Results  Component Value Date   HGBA1C 5.4 03/08/2015   Lab Results  Component Value Date   CHOL 235 (H) 03/06/2009   HDL 108.40 03/06/2009   LDLDIRECT 108.2 03/06/2009    Significant Diagnostic Results in last 30 days:  No results found.  Assessment/Plan Chronic pulmonary embolism (HCC) Hx of it, off Eliquis. Taking Bero daily, Mucinex bid.   GERD (gastroesophageal reflux disease) Stable, continue Omeprazole 20mg  daily, Reglan ac meals, observe.   Hypothyroidism Continue Levothyroxine 18mcg, TSH 1.51 07/17/16  Dementia 05/19/14 MMSE 27/30, continue Aricept and Namenda  Seizure disorder ([place]) Last seizure 2010 06/07/16 Phenytoin 13.610-20) Continue Dilantin 100mg  am, 200mg  hs   Osteoarthritis, multiple sites livable, continue Mobic 7.5mg  daily, Tylenol 650mg  to bid  Depression Noted involuntary lip smacking and mild tremor in hands, decreased Cymbalta to 30mg  qd, observe.   Back pain livable, continue Mobic 7.5mg  daily, Tylenol 650mg  bid is adequate, ambulates with walker     Family/ staff Communication: [facility]  Labs/tests ordered:  none

## 2016-10-17 NOTE — Assessment & Plan Note (Signed)
Last seizure 2010 06/07/16 Phenytoin 13.610-20) Continue Dilantin 100mg  am, 200mg  hs

## 2016-10-19 DIAGNOSIS — R05 Cough: Secondary | ICD-10-CM | POA: Diagnosis not present

## 2016-10-20 ENCOUNTER — Encounter: Payer: Self-pay | Admitting: Family

## 2016-10-20 DIAGNOSIS — R319 Hematuria, unspecified: Secondary | ICD-10-CM | POA: Diagnosis not present

## 2016-10-20 DIAGNOSIS — N390 Urinary tract infection, site not specified: Secondary | ICD-10-CM | POA: Diagnosis not present

## 2016-10-20 NOTE — Progress Notes (Signed)
This encounter was created in error - please disregard.

## 2016-10-21 ENCOUNTER — Non-Acute Institutional Stay: Payer: Medicare Other | Admitting: Family

## 2016-10-21 ENCOUNTER — Encounter: Payer: Self-pay | Admitting: Family

## 2016-10-21 DIAGNOSIS — M75101 Unspecified rotator cuff tear or rupture of right shoulder, not specified as traumatic: Secondary | ICD-10-CM

## 2016-10-21 DIAGNOSIS — R509 Fever, unspecified: Secondary | ICD-10-CM

## 2016-10-21 DIAGNOSIS — J0191 Acute recurrent sinusitis, unspecified: Secondary | ICD-10-CM | POA: Diagnosis not present

## 2016-10-21 MED ORDER — DOXYCYCLINE HYCLATE 100 MG PO TABS: 100.0000 mg | ORAL_TABLET | Freq: Two times a day (BID) | ORAL | 0 refills | Status: AC

## 2016-10-21 MED ORDER — SACCHAROMYCES BOULARDII 250 MG PO CAPS: 250.0000 mg | ORAL_CAPSULE | Freq: Two times a day (BID) | ORAL | 0 refills | Status: AC

## 2016-10-21 NOTE — Progress Notes (Addendum)
Location:  [facility] Room Number: 36 Place of Service:  ALF [phone]) Provider: Dinah [name] FNP-C  [name], MD  Patient Care Team: [name], MD as PCP - General (Internal Medicine) [name], [facility] [name], MD as Consulting Physician (Orthopedic Surgery) [name], MD as Consulting Physician (Physical Medicine and Rehabilitation) Melina Schools, MD as Consulting Physician (Orthopedic Surgery) [name], MD as Consulting Physician (Otolaryngology) Mast, Man X, NP as Nurse Practitioner (Internal Medicine)  Extended Emergency Contact Information Primary Emergency Contact: Fitzpatrick,[name] Address: [address], [place] of [place] Phone: [phone] Mobile Phone: [phone] Relation: Son Secondary Emergency Contact: [name] [place] of [place] Phone: [phone] Mobile Phone: [phone] Relation: Daughter  Code Status:  DNR  Goals of care: Advanced Directive information Advanced Directives 10/21/2016  Does Patient Have a Medical Advance Directive? Yes  Type of Advance Directive -  Does patient want to make changes to medical advance directive? -  Copy of [place] in Chart? -  Pre-existing out of facility DNR order (yellow form or pink MOST form) -     Chief Complaint  Patient presents with  . Acute Visit    cough,congestion    HPI:  Pt is a 83 y.o. female seen today at [facility] for an acute visit for evaluation of cough, nasal congestion and abnormal CXR results. She has a medical history of Dyslipidemia,chronic PE, DVT, hypothyroidism, seizure disorder, OA, Depression among other conditions. She is seen in her room today. She complains of cough and nasal drainage.She states coughing up whitish phlegm and blowing out yellowish drainage from the nose. She denies any shortness of breath, wheezing, chest pain or chills. Facility Nurse reports Temp  100.Her recent chest X-ray results showed significant chronic lung changes without significant interval changes from previous x-ray. No acute pulmonary disease identified. Also showed degenerative change of the thoracic spine. Changes in the right shoulder suggest the possibility of a rotator cuff tear. Cardiomegaly present without significant interval change ( 10/19/2016).     Past Medical History:  Diagnosis Date  . Anemia, unspecified 03/18/2011  . Anxiety state, unspecified 01/2011  . Blood in stool 06/15/2012  . Corns and callosities 07/01/2011  . Diverticulosis 02/2011  . Dizziness   . DJD (degenerative joint disease)   . Dysuria 06/17/2011  . Hemorrhoids 02/2011  . Low back pain   . Mitral valve problem thickened   thickened  . Osteoarthritis of both hands 10/09/2015  . Osteoporosis 02/2011  . Other abnormal blood chemistry 0/30/2012  . Other acquired deformity of toe 12/16/2011  . Pain in joint, site unspecified 01/2012  . 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 Sacroiliitis, not elsewhere classified ([place]) 05/2011  . Seizures ([place])    Remotely  . Unspecified hypothyroidism 01/2011  . 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 Vitamin D deficiency 01/2011   Past Surgical History:  Procedure Laterality Date  . bletheroplasty    . CATARACT EXTRACTION W/ INTRAOCULAR LENS  IMPLANT, BILATERAL  2010  . CERVICAL LAMINECTOMY  2005  . HAMMER TOE SURGERY  2013   right 2nd toe Dr. Mallie Mussel  . JOINT REPLACEMENT Bilateral 2002 Right, 1992 Left   knees    Allergies  Allergen Reactions  . Macrodantin [Nitrofurantoin]   . Morphine And Related Other (See Comments)    Altered mental state   .  Sulfa Antibiotics Other (See Comments)    Reaction: unknown    Allergies as of 10/21/2016      Reactions   Macrodantin [nitrofurantoin]    Morphine And Related Other (See Comments)   Altered mental state    Sulfa Antibiotics Other (See Comments)    Reaction: unknown      Medication List       Accurate as of 10/21/16  2:34 PM. Always use your most recent med list.          acetaminophen 325 MG tablet Commonly known as:  TYLENOL Take 325 mg by mouth. Take 2 tablets twice daily   amoxicillin 500 MG capsule Commonly known as:  AMOXIL Take 2,000 mg by mouth as directed. 1 Hour Before Procedure.   BREO ELLIPTA 100-25 MCG/INH Aepb Generic drug:  fluticasone furoate-vilanterol Inhale 1 puff into the lungs daily.   CALCIUM 600+D 600-400 MG-UNIT tablet Generic drug:  Calcium Carbonate-Vitamin D Take 1 tablet by mouth daily.   clonazePAM 0.5 MG tablet Commonly known as:  KLONOPIN Take one tablet by mouth 2 times a day at 8 am and 8 pm.   donepezil 10 MG tablet Commonly known as:  ARICEPT Take 10 mg by mouth daily.   DULoxetine 30 MG capsule Commonly known as:  CYMBALTA Take 30 mg by mouth. Take one tablet daily   guaiFENesin 600 MG 12 hr tablet Commonly known as:  MUCINEX Take by mouth 2 (two) times daily.   levothyroxine 100 MCG tablet Commonly known as:  SYNTHROID, LEVOTHROID Take 1 tablet (100 mcg total) by mouth daily before breakfast. For thyroid   meloxicam 7.5 MG tablet Commonly known as:  MOBIC Take 7.5 mg by mouth daily.   memantine 10 MG tablet Commonly known as:  NAMENDA Take 10 mg by mouth 2 (two) times daily.   metoCLOPramide 5 MG tablet Commonly known as:  REGLAN One tablet before each meal to help reflux   Omega-3 1000 MG Caps Take 1 capsule by mouth daily. Take one capsule every morning   omeprazole 20 MG capsule Commonly known as:  PRILOSEC Take 20 mg by mouth at bedtime.   phenytoin 100 MG ER capsule Commonly known as:  DILANTIN Take 100-200 mg by mouth 2 (two) times daily. Take one capsule every morning; take 2 capsules at bedtime   ROBAFEN DM CGH/CHEST CONGEST 10-100 MG/5ML liquid Generic drug:  dextromethorphan-guaiFENesin Take by mouth.   TAB-A-VITE PO Take 1 tablet by mouth  daily.   THERA M PLUS PO Take by mouth. Take 9 mg once daily   triamcinolone cream 0.1 % Commonly known as:  KENALOG   trolamine salicylate 10 % cream Commonly known as:  ASPERCREME Apply 1 application topically. Apply as needed   Vitamin D3 5000 units Tabs Take 1 tablet by mouth daily. Take one tablet daily       Review of Systems  Constitutional: Positive for fever. Negative for activity change, appetite change, chills and fatigue.  HENT: Positive for rhinorrhea and sinus pressure. Negative for congestion, sinus pain, sore throat and trouble swallowing.   Eyes: Negative.   Respiratory: Positive for cough. Negative for chest tightness, shortness of breath and wheezing.   Cardiovascular: Negative for chest pain, palpitations and leg swelling.  Gastrointestinal: Negative for abdominal distention, abdominal pain, constipation, diarrhea, nausea and vomiting.  Genitourinary: Negative for dysuria, flank pain, frequency and urgency.  Musculoskeletal: Positive for gait problem.  Skin: Negative for color change, pallor, rash and wound.  Neurological: Negative  for dizziness, syncope, light-headedness and headaches.  Hematological: Does not bruise/bleed easily.  Psychiatric/Behavioral: Negative for agitation, confusion, hallucinations and sleep disturbance. The patient is not nervous/anxious.     Immunization History  Administered Date(s) Administered  . Influenza Whole 03/17/2007, 02/16/2009, 01/30/2010, 02/16/2013  . Influenza-Unspecified 03/02/2014, 02/22/2015, 02/28/2016  . PPD Test 07/09/2010  . Pneumococcal Polysaccharide-23 05/19/2005, 05/23/2013  . Td 05/19/2005  . Tetanus 10/06/2012   Pertinent  Health Maintenance Due  Topic Date Due  . DEXA SCAN  11/19/1994  . PNA vac Low Risk Adult (2 of 2 - PCV13) 05/23/2014  . INFLUENZA VACCINE  12/17/2016   Fall Risk  10/09/2015 07/10/2015 03/26/2015 12/19/2014 03/22/2013  Falls in the past year? No Yes Yes Yes Yes  Number falls in  past yr: - 1 1 1 2  or more  Injury with Fall? - Yes No Yes -    Vitals:   10/21/16 0858  BP: 126/60  Pulse: 74  Resp: 20  Temp: (!) 100.7 F (38.2 C)  SpO2: 90%  Weight: 143 lb (64.9 kg)  Height: 5' (1.524 m)   Body mass index is 27.93 kg/m. Physical Exam  Constitutional: She is oriented to person, place, and time. She appears well-developed and well-nourished. No distress.  Elderly  HENT:  Head: Normocephalic.  Mouth/Throat: Oropharynx is clear and moist. No oropharyngeal exudate.  Slight tenderness to maxillary area.   Eyes: Conjunctivae and EOM are normal. Pupils are equal, round, and reactive to light. Right eye exhibits no discharge. Left eye exhibits no discharge. No scleral icterus.  Neck: Normal range of motion. No JVD present. No thyromegaly present.  Cardiovascular: Normal rate, regular rhythm, normal heart sounds and intact distal pulses.  Exam reveals no gallop and no friction rub.   No murmur heard. Pulmonary/Chest: Effort normal and breath sounds normal. No respiratory distress. She has no wheezes. She has no rales.  Abdominal: Soft. Bowel sounds are normal. She exhibits no distension. There is no tenderness. There is no rebound and no guarding.  Musculoskeletal: She exhibits no edema or tenderness.  Moves x 4 extremities. Arthritic changes to fingers noted. Unsteady gait. Uses FWW.   Lymphadenopathy:    She has no cervical adenopathy.  Neurological: She is oriented to person, place, and time.  Skin: Skin is warm and dry. No rash noted. No erythema. No pallor.  Bilateral knee old surgical incision   Psychiatric: She has a normal mood and affect.    Labs reviewed:  Recent Labs  03/05/16 1730 03/30/16 2033 07/17/16  NA 140 139 144  K 4.8 4.6 4.0  CL 105 105  --   CO2 29  --   --   GLUCOSE 99 89  --   BUN 31* 33* 26*  CREATININE 0.95 0.90 0.7  CALCIUM 8.7*  --   --     Recent Labs  03/05/16 1730 07/17/16  AST 27 21  ALT 19 11  ALKPHOS 82 77    BILITOT 0.5  --   PROT 6.4*  --   ALBUMIN 3.5  --     Recent Labs  12/13/15 03/05/16 1730 03/30/16 2033 07/17/16  WBC 6.1 5.9  --  6.7  NEUTROABS  --  3.9  --   --   HGB 14.1 12.6 13.3 13.4  HCT  --  39.4 39.0 41  MCV  --  99.5  --   --   PLT 190 173  --  199   Lab Results  Component Value Date  TSH 1.51 07/17/2016   Lab Results  Component Value Date   HGBA1C 5.4 03/08/2015   Lab Results  Component Value Date   CHOL 235 (H) 03/06/2009   HDL 108.40 03/06/2009   LDLDIRECT 108.2 03/06/2009    Significant Diagnostic Results in last 30 days:  Chest X-ray 10/19/2016 chest X-ray results showed significant chronic lung changes without significant interval changes from previous x-ray. No acute pulmonary disease identified. Also showed degenerative change of the thoracic spine. Changes in the right shoulder suggest the possibility of a rotator cuff tear. Cardiomegaly present without significant interval change   Assessment/Plan 1. Acute recurrent sinusitis, unspecified location Doxycycline 100mg  Tablet one by mouth twice daily x 10 days. florastor 250 mg capsule twice daily x 10 days for prophylaxis.monitor Temp curve. Continue on tylenol as needed.   2. Fever, unspecified fever cause Temp 100.7 Possible due to sinusitis.CXR negative for acute abnormalities. Urine specimen ordered already for U/A and C/S to rule out UTI. Continue on tylenol as needed.   3. Tear of right rotator cuff, unspecified tear extent chest X-ray results showed degenerative change of the thoracic spine. Changes in the right shoulder suggest the possibility of a rotator cuff tear. ( 10/19/2016). Will consult with Ortho if desired .Patient completes ROM without any difficulty.PT/OT to evaluate and treat as indicated.    Family/ staff Communication: Reviewed plan of care with patient and facility Nurse supervisor  Labs/tests ordered: None   Sandrea Hughs, NP

## 2016-10-22 NOTE — Addendum Note (Signed)
Addended byMarlowe Sax C on: 10/22/2016 09:38 AM   Modules accepted: Level of Service

## 2016-10-23 DIAGNOSIS — R296 Repeated falls: Secondary | ICD-10-CM | POA: Diagnosis not present

## 2016-10-23 DIAGNOSIS — R41841 Cognitive communication deficit: Secondary | ICD-10-CM | POA: Diagnosis not present

## 2016-10-23 DIAGNOSIS — M6281 Muscle weakness (generalized): Secondary | ICD-10-CM | POA: Diagnosis not present

## 2016-10-23 DIAGNOSIS — M25552 Pain in left hip: Secondary | ICD-10-CM | POA: Diagnosis not present

## 2016-10-23 DIAGNOSIS — R2681 Unsteadiness on feet: Secondary | ICD-10-CM | POA: Diagnosis not present

## 2016-10-28 ENCOUNTER — Encounter: Payer: Self-pay | Admitting: Internal Medicine

## 2016-10-28 DIAGNOSIS — M6281 Muscle weakness (generalized): Secondary | ICD-10-CM | POA: Diagnosis not present

## 2016-10-28 DIAGNOSIS — R2681 Unsteadiness on feet: Secondary | ICD-10-CM | POA: Diagnosis not present

## 2016-10-28 DIAGNOSIS — M25552 Pain in left hip: Secondary | ICD-10-CM | POA: Diagnosis not present

## 2016-10-28 DIAGNOSIS — R41841 Cognitive communication deficit: Secondary | ICD-10-CM | POA: Diagnosis not present

## 2016-10-28 DIAGNOSIS — R296 Repeated falls: Secondary | ICD-10-CM | POA: Diagnosis not present

## 2016-10-29 DIAGNOSIS — R296 Repeated falls: Secondary | ICD-10-CM | POA: Diagnosis not present

## 2016-10-29 DIAGNOSIS — R2681 Unsteadiness on feet: Secondary | ICD-10-CM | POA: Diagnosis not present

## 2016-10-29 DIAGNOSIS — R41841 Cognitive communication deficit: Secondary | ICD-10-CM | POA: Diagnosis not present

## 2016-10-29 DIAGNOSIS — M25552 Pain in left hip: Secondary | ICD-10-CM | POA: Diagnosis not present

## 2016-10-29 DIAGNOSIS — M6281 Muscle weakness (generalized): Secondary | ICD-10-CM | POA: Diagnosis not present

## 2016-10-30 DIAGNOSIS — R2681 Unsteadiness on feet: Secondary | ICD-10-CM | POA: Diagnosis not present

## 2016-10-30 DIAGNOSIS — M6281 Muscle weakness (generalized): Secondary | ICD-10-CM | POA: Diagnosis not present

## 2016-10-30 DIAGNOSIS — R296 Repeated falls: Secondary | ICD-10-CM | POA: Diagnosis not present

## 2016-10-30 DIAGNOSIS — M25552 Pain in left hip: Secondary | ICD-10-CM | POA: Diagnosis not present

## 2016-10-30 DIAGNOSIS — R41841 Cognitive communication deficit: Secondary | ICD-10-CM | POA: Diagnosis not present

## 2016-11-03 ENCOUNTER — Non-Acute Institutional Stay: Payer: Medicare Other | Admitting: Internal Medicine

## 2016-11-03 ENCOUNTER — Encounter: Payer: Self-pay | Admitting: Internal Medicine

## 2016-11-03 DIAGNOSIS — K219 Gastro-esophageal reflux disease without esophagitis: Secondary | ICD-10-CM

## 2016-11-03 DIAGNOSIS — R41841 Cognitive communication deficit: Secondary | ICD-10-CM | POA: Diagnosis not present

## 2016-11-03 DIAGNOSIS — R05 Cough: Secondary | ICD-10-CM | POA: Diagnosis not present

## 2016-11-03 DIAGNOSIS — M6281 Muscle weakness (generalized): Secondary | ICD-10-CM | POA: Diagnosis not present

## 2016-11-03 DIAGNOSIS — R296 Repeated falls: Secondary | ICD-10-CM | POA: Diagnosis not present

## 2016-11-03 DIAGNOSIS — R2681 Unsteadiness on feet: Secondary | ICD-10-CM | POA: Diagnosis not present

## 2016-11-03 DIAGNOSIS — R053 Chronic cough: Secondary | ICD-10-CM

## 2016-11-03 DIAGNOSIS — M25552 Pain in left hip: Secondary | ICD-10-CM | POA: Diagnosis not present

## 2016-11-03 DIAGNOSIS — G259 Extrapyramidal and movement disorder, unspecified: Secondary | ICD-10-CM

## 2016-11-03 NOTE — Progress Notes (Signed)
Location:  [facility] Room Number: 36 Place of Service:  ALF [phone]) Provider:    [name], MD  Patient Care Team: [name], MD as PCP - General (Internal Medicine) [name], [facility] [name], MD as Consulting Physician (Orthopedic Surgery) [name], MD as Consulting Physician (Physical Medicine and Rehabilitation) Melina Schools, MD as Consulting Physician (Orthopedic Surgery) [name], MD as Consulting Physician (Otolaryngology) [name], [name], NP as Nurse Practitioner (Family Medicine)  Extended Emergency Contact Information Primary Emergency Contact: [name] Address: [address], [place] of [place] Phone: [phone] Mobile Phone: [phone] Relation: Son Secondary Emergency Contact: [name] [place] of [place] Phone: [phone] Mobile Phone: [phone] Relation: Daughter  Code Status:  DNR  Goals of care: Advanced Directive information Advanced Directives 11/03/2016  Does Patient Have a Medical Advance Directive? Yes  Type of Paramedic of [place];Living will;Out of facility DNR (pink MOST or yellow form)  Does patient want to make changes to medical advance directive? -  Copy of [place] in Chart? Yes  Pre-existing out of facility DNR order (yellow form or pink MOST form) Yellow form placed in chart (order not valid for inpatient use)     Chief Complaint  Patient presents with  . Acute Visit    cough; medication management    HPI:  Patient is a 83 y.o. female seen today for an acute visit for cough and medication management. Staff noticed her to be coughing this am. She is seen in her room. She denies any cough at present. Breathing has been stable. She denies any runny nose or sore throat. She has been on reglan with meals and PPI. She denies any heartburn. On review of chart, has hx of chronic respiratory  failure and is on bronchodilator    Past Medical History:  Diagnosis Date  . Anemia, unspecified 03/18/2011  . Anxiety state, unspecified 01/2011  . Blood in stool 06/15/2012  . Corns and callosities 07/01/2011  . Diverticulosis 02/2011  . Dizziness   . DJD (degenerative joint disease)   . Dysuria 06/17/2011  . Hemorrhoids 02/2011  . Low back pain   . Mitral valve problem thickened   thickened  . Osteoarthritis of both hands 10/09/2015  . Osteoporosis 02/2011  . Other abnormal blood chemistry 0/30/2012  . Other acquired deformity of toe 12/16/2011  . Pain in joint, site unspecified 01/2012  . 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 Sacroiliitis, not elsewhere classified ([place]) 05/2011  . Seizures ([place])    Remotely  . Unspecified hypothyroidism 01/2011  . 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 Vitamin D deficiency 01/2011   Past Surgical History:  Procedure Laterality Date  . bletheroplasty    . CATARACT EXTRACTION W/ INTRAOCULAR LENS  IMPLANT, BILATERAL  2010  . CERVICAL LAMINECTOMY  2005  . HAMMER TOE SURGERY  2013   right 2nd toe Dr. Mallie Mussel  . JOINT REPLACEMENT Bilateral 2002 Right, 1992 Left   knees    Allergies  Allergen Reactions  . Macrodantin [Nitrofurantoin]   . Morphine And Related Other (See Comments)    Altered mental state   . Sulfa Antibiotics Other (See Comments)    Reaction: unknown    Outpatient Encounter Prescriptions as of 11/03/2016  Medication Sig  . acetaminophen (TYLENOL) 325 MG tablet Take 325 mg by mouth. Take 2 tablets  twice daily  . amoxicillin (AMOXIL) 500 MG capsule Take 2,000 mg by mouth as directed. 1 Hour Before Procedure.  . Calcium Carbonate-Vitamin D (CALCIUM 600+D) 600-400 MG-UNIT tablet Take 1 tablet by mouth daily.  . Cholecalciferol (VITAMIN D3) 5000 units TABS Take 1 tablet by mouth daily. Take one tablet daily   . clonazePAM (KLONOPIN) 0.5 MG tablet Take one tablet by mouth 2  times a day at 8 am and 8 pm.  . dextromethorphan-guaiFENesin (ROBAFEN DM CGH/CHEST CONGEST) 10-100 MG/5ML liquid Take by mouth.  . donepezil (ARICEPT) 10 MG tablet Take 10 mg by mouth daily.  . DULoxetine (CYMBALTA) 30 MG capsule Take 30 mg by mouth. Take one tablet daily  . fluticasone furoate-vilanterol (BREO ELLIPTA) 100-25 MCG/INH AEPB Inhale 1 puff into the lungs daily.  Marland Kitchen guaiFENesin (MUCINEX) 600 MG 12 hr tablet Take 600 mg by mouth at bedtime.   Marland Kitchen levothyroxine (SYNTHROID, LEVOTHROID) 100 MCG tablet Take 1 tablet (100 mcg total) by mouth daily before breakfast. For thyroid  . meloxicam (MOBIC) 7.5 MG tablet Take 7.5 mg by mouth daily as needed.   . memantine (NAMENDA) 10 MG tablet Take 10 mg by mouth 2 (two) times daily.  . metoCLOPramide (REGLAN) 5 MG tablet One tablet before each meal to help reflux  . Multiple Vitamins-Minerals (THERA M PLUS PO) Take by mouth. Take 9 mg once daily  . Omega-3 1000 MG CAPS Take 1 capsule by mouth daily. Take one capsule every morning   . omeprazole (PRILOSEC) 20 MG capsule Take 20 mg by mouth at bedtime.  . phenytoin (DILANTIN) 100 MG ER capsule Take 100-200 mg by mouth 2 (two) times daily. Take one capsule every morning; take 2 capsules at bedtime   . triamcinolone cream (KENALOG) 0.1 %   . trolamine salicylate (ASPERCREME) 10 % cream Apply 1 application topically. Apply as needed  . [DISCONTINUED] Multiple Vitamin (TAB-A-VITE PO) Take 1 tablet by mouth daily.   No facility-administered encounter medications on file as of 11/03/2016.     Review of Systems  Constitutional: Negative for appetite change, chills and fever.  HENT: Positive for hearing loss. Negative for rhinorrhea.   Respiratory: Negative for cough, shortness of breath and wheezing.   Cardiovascular: Negative for chest pain and palpitations.  Gastrointestinal: Negative for abdominal pain, constipation, diarrhea, nausea and vomiting.  Skin: Negative for rash.  Psychiatric/Behavioral:         Anxiety and memory deficit    Immunization History  Administered Date(s) Administered  . Influenza Whole 03/17/2007, 02/16/2009, 01/30/2010, 02/16/2013  . Influenza-Unspecified 03/02/2014, 02/22/2015, 02/28/2016  . PPD Test 07/09/2010, 06/04/2015  . Pneumococcal Polysaccharide-23 05/19/2005, 05/23/2013  . Td 05/19/2005  . Tetanus 10/06/2012   Pertinent  Health Maintenance Due  Topic Date Due  . DEXA SCAN  11/19/1994  . PNA vac Low Risk Adult (2 of 2 - PCV13) 05/23/2014  . INFLUENZA VACCINE  12/17/2016   Fall Risk  10/09/2015 07/10/2015 03/26/2015 12/19/2014 03/22/2013  Falls in the past year? No Yes Yes Yes Yes  Number falls in past yr: - 1 1 1 2  or more  Injury with Fall? - Yes No Yes -   Functional Status Survey:    Vitals:   11/03/16 1403  BP: 128/66  Pulse: 76  Resp: 20  Temp: 98.6 F (37 C)  SpO2: 95%  Weight: 141 lb (64 kg)  Height: 5' (1.524 m)   Body mass index is 27.54 kg/m. Physical Exam  Constitutional: She appears well-developed and well-nourished.  No distress.  HENT:  Head: Normocephalic and atraumatic.  Eyes: Pupils are equal, round, and reactive to light.  Neck: Normal range of motion. Neck supple.  Cardiovascular: Normal rate and regular rhythm.   Pulmonary/Chest: Effort normal and breath sounds normal. No respiratory distress. She has no wheezes. She has no rales.  Abdominal: Soft. Bowel sounds are normal. There is no tenderness. There is no guarding.  Lymphadenopathy:    She has no cervical adenopathy.  Neurological: She is alert.  Oriented to self only. Has tremors to her hands and tremors to her lips while talking  Skin: Skin is warm and dry. She is not diaphoretic.  Psychiatric:  Appears anxious, poor insight    Labs reviewed:  Recent Labs  03/05/16 1730 03/30/16 2033 07/17/16  NA 140 139 144  K 4.8 4.6 4.0  CL 105 105  --   CO2 29  --   --   GLUCOSE 99 89  --   BUN 31* 33* 26*  CREATININE 0.95 0.90 0.7  CALCIUM 8.7*  --   --      Recent Labs  03/05/16 1730 07/17/16  AST 27 21  ALT 19 11  ALKPHOS 82 77  BILITOT 0.5  --   PROT 6.4*  --   ALBUMIN 3.5  --     Recent Labs  12/13/15 03/05/16 1730 03/30/16 2033 07/17/16  WBC 6.1 5.9  --  6.7  NEUTROABS  --  3.9  --   --   HGB 14.1 12.6 13.3 13.4  HCT  --  39.4 39.0 41  MCV  --  99.5  --   --   PLT 190 173  --  199   Lab Results  Component Value Date   TSH 1.51 07/17/2016   Lab Results  Component Value Date   HGBA1C 5.4 03/08/2015   Lab Results  Component Value Date   CHOL 235 (H) 03/06/2009   HDL 108.40 03/06/2009   LDLDIRECT 108.2 03/06/2009    Significant Diagnostic Results in last 30 days:  No results found.  Assessment/Plan  gerd Stable symptom. Currently on omeprazole 20 mg daily and reglan with meals. Denies any reflux symptom. reglan is a short term medication. Will attempt trial of discontinuation to avoid its side effect in elderly.  Extra pyramidal symptom Has tremor to hand and mouth noted. Possible side effect from reglan. With controlled symptom, discontinue reglan. Monitor for GI symptom. Monitor her symptom  Chronic Cough None at present, on med review, she is on robitussin every night, lungs clear on exam. Continue her cough syrup and ellipta. Recently treated for ac sinusitis. Monitor clinically. Aspiration precautions with her high aspiration risk with dementia. Monitor for signs of dyspnea or worsening cough.   Family/ staff Communication: reviewed care plan with patient and charge nurse.    Labs/tests ordered:  None  [name], MD Internal Medicine [facility] [address] [place], [place] [address] Cell Phone (Monday-Friday 8 am - 5 pm): [phone] On Call: [phone] and follow prompts after 5 pm and on weekends Office Phone: [phone] Office Fax: [phone]

## 2016-11-03 NOTE — Addendum Note (Signed)
Addended byBlanchie Serve on: 11/03/2016 06:03 PM   Modules accepted: Level of Service

## 2016-11-04 DIAGNOSIS — R41841 Cognitive communication deficit: Secondary | ICD-10-CM | POA: Diagnosis not present

## 2016-11-04 DIAGNOSIS — M6281 Muscle weakness (generalized): Secondary | ICD-10-CM | POA: Diagnosis not present

## 2016-11-04 DIAGNOSIS — M25552 Pain in left hip: Secondary | ICD-10-CM | POA: Diagnosis not present

## 2016-11-04 DIAGNOSIS — R2681 Unsteadiness on feet: Secondary | ICD-10-CM | POA: Diagnosis not present

## 2016-11-04 DIAGNOSIS — R296 Repeated falls: Secondary | ICD-10-CM | POA: Diagnosis not present

## 2016-11-05 ENCOUNTER — Non-Acute Institutional Stay: Payer: Medicare Other | Admitting: Internal Medicine

## 2016-11-05 ENCOUNTER — Encounter: Payer: Self-pay | Admitting: Internal Medicine

## 2016-11-05 DIAGNOSIS — G309 Alzheimer's disease, unspecified: Secondary | ICD-10-CM | POA: Diagnosis not present

## 2016-11-05 DIAGNOSIS — M791 Myalgia: Secondary | ICD-10-CM | POA: Diagnosis not present

## 2016-11-05 DIAGNOSIS — J309 Allergic rhinitis, unspecified: Secondary | ICD-10-CM | POA: Diagnosis not present

## 2016-11-05 DIAGNOSIS — F0281 Dementia in other diseases classified elsewhere with behavioral disturbance: Secondary | ICD-10-CM | POA: Diagnosis not present

## 2016-11-05 DIAGNOSIS — G40909 Epilepsy, unspecified, not intractable, without status epilepticus: Secondary | ICD-10-CM

## 2016-11-05 DIAGNOSIS — M7918 Myalgia, other site: Secondary | ICD-10-CM

## 2016-11-05 NOTE — Progress Notes (Signed)
Location:  [facility] Room Number: 36 Place of Service:  ALF [phone]) Provider:    [name], MD  Patient Care Team: [name], MD as PCP - General (Internal Medicine) [name], [facility] [name], MD as Consulting Physician (Orthopedic Surgery) [name], MD as Consulting Physician (Physical Medicine and Rehabilitation) Melina Schools, MD as Consulting Physician (Orthopedic Surgery) [name], MD as Consulting Physician (Otolaryngology) [name], [name], NP as Nurse Practitioner (Family Medicine)  Extended Emergency Contact Information Primary Emergency Contact: [name] Address: [address], [place] Phone: [phone] Mobile Phone: [phone] Relation: Son Secondary Emergency Contact: [name] [place] Phone: [phone] Mobile Phone: [phone] Relation: Daughter  Code Status:  DNR  Goals of care: Advanced Directive information Advanced Directives 11/03/2016  Does Patient Have a Medical Advance Directive? Yes  Type of Paramedic of [place];Living will;Out of facility DNR (pink MOST or yellow form)  Does patient want to make changes to medical advance directive? -  Copy of [place] in Chart? Yes  Pre-existing out of facility DNR order (yellow form or pink MOST form) Yellow form placed in chart (order not valid for inpatient use)     Chief Complaint  Patient presents with  . Acute Visit    generalized body aches    HPI:  Patient is a 83 y.o. female seen today for an acute visit for ongoing cough and now runny nose. She has been afebrile. Staff have notice change with her behavior in the late afternoon early evening now. She complaints of generalized body aches. On review of chart, has hx of chronic respiratory failure and is on bronchodilator. Per nursing, there was an episode of possible seizure with  jerking movement of her hands. She has returned to her baseline and vitals have been stable. No injury reported.    Past Medical History:  Diagnosis Date  . Anemia, unspecified 03/18/2011  . Anxiety state, unspecified 01/2011  . Blood in stool 06/15/2012  . Corns and callosities 07/01/2011  . Diverticulosis 02/2011  . Dizziness   . DJD (degenerative joint disease)   . Dysuria 06/17/2011  . Hemorrhoids 02/2011  . Low back pain   . Mitral valve problem thickened   thickened  . Osteoarthritis of both hands 10/09/2015  . Osteoporosis 02/2011  . Other abnormal blood chemistry 0/30/2012  . Other acquired deformity of toe 12/16/2011  . Pain in joint, site unspecified 01/2012  . 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 Sacroiliitis, not elsewhere classified ([place]) 05/2011  . Seizures ([place])    Remotely  . Unspecified hypothyroidism 01/2011  . 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 Vitamin D deficiency 01/2011   Past Surgical History:  Procedure Laterality Date  . bletheroplasty    . CATARACT EXTRACTION W/ INTRAOCULAR LENS  IMPLANT, BILATERAL  2010  . CERVICAL LAMINECTOMY  2005  . HAMMER TOE SURGERY  2013   right 2nd toe Dr. Mallie Mussel  . JOINT REPLACEMENT Bilateral 2002 Right, 1992 Left   knees    Allergies  Allergen Reactions  . Macrodantin [Nitrofurantoin]   . Morphine And Related Other (See Comments)    Altered mental state   . Sulfa Antibiotics Other (See Comments)    Reaction: unknown    Outpatient Encounter Prescriptions as of 11/05/2016  Medication Sig  . acetaminophen (TYLENOL) 325  MG tablet Take 650 mg by mouth every 4 (four) hours as needed. Take 2 tablets twice daily   . amoxicillin (AMOXIL) 500 MG capsule Take 2,000 mg by mouth as directed. 1 Hour Before Procedure.  . Calcium Carbonate-Vitamin D (CALCIUM 600+D) 600-400 MG-UNIT tablet Take 1 tablet by mouth daily.  . Cholecalciferol (VITAMIN D3) 5000 units TABS Take 1  tablet by mouth daily. Take one tablet daily   . clonazePAM (KLONOPIN) 0.5 MG tablet Take one tablet by mouth 2 times a day at 8 am and 8 pm.  . dextromethorphan-guaiFENesin (ROBAFEN DM CGH/CHEST CONGEST) 10-100 MG/5ML liquid Take by mouth.  . donepezil (ARICEPT) 10 MG tablet Take 10 mg by mouth daily.  . DULoxetine (CYMBALTA) 30 MG capsule Take 30 mg by mouth. Take one tablet daily  . fluticasone furoate-vilanterol (BREO ELLIPTA) 100-25 MCG/INH AEPB Inhale 1 puff into the lungs daily.  Marland Kitchen guaiFENesin (MUCINEX) 600 MG 12 hr tablet Take 600 mg by mouth at bedtime.   Marland Kitchen levothyroxine (SYNTHROID, LEVOTHROID) 100 MCG tablet Take 1 tablet (100 mcg total) by mouth daily before breakfast. For thyroid  . meloxicam (MOBIC) 7.5 MG tablet Take 7.5 mg by mouth daily as needed.   . memantine (NAMENDA) 10 MG tablet Take 10 mg by mouth 2 (two) times daily.  . Multiple Vitamins-Minerals (THERA M PLUS PO) Take by mouth. Take 9 mg once daily  . Omega-3 1000 MG CAPS Take 1 capsule by mouth daily. Take one capsule every morning   . omeprazole (PRILOSEC) 20 MG capsule Take 20 mg by mouth at bedtime.  . phenytoin (DILANTIN) 100 MG ER capsule Take 100-200 mg by mouth 2 (two) times daily. Take one capsule every morning; take 2 capsules at bedtime   . triamcinolone cream (KENALOG) 0.1 % Apply 1 application topically daily as needed.   . trolamine salicylate (ASPERCREME) 10 % cream Apply 1 application topically. Apply as needed  . [DISCONTINUED] metoCLOPramide (REGLAN) 5 MG tablet One tablet before each meal to help reflux   No facility-administered encounter medications on file as of 11/05/2016.     Review of Systems  Constitutional: Negative for appetite change, chills and fever.  HENT: Positive for hearing loss and rhinorrhea.   Respiratory: Positive for cough. Negative for shortness of breath and wheezing.        Mostly dry cough  Cardiovascular: Negative for chest pain and palpitations.  Gastrointestinal:  Negative for abdominal pain, constipation, diarrhea, nausea and vomiting.  Skin: Negative for rash.  Psychiatric/Behavioral:       Anxiety and memory deficit    Immunization History  Administered Date(s) Administered  . Influenza Whole 03/17/2007, 02/16/2009, 01/30/2010, 02/16/2013  . Influenza-Unspecified 03/02/2014, 02/22/2015, 02/28/2016  . PPD Test 07/09/2010, 06/04/2015  . Pneumococcal Polysaccharide-23 05/19/2005, 05/23/2013  . Td 05/19/2005  . Tetanus 10/06/2012   Pertinent  Health Maintenance Due  Topic Date Due  . DEXA SCAN  11/19/1994  . PNA vac Low Risk Adult (2 of 2 - PCV13) 05/23/2014  . INFLUENZA VACCINE  12/17/2016   Fall Risk  10/09/2015 07/10/2015 03/26/2015 12/19/2014 03/22/2013  Falls in the past year? No Yes Yes Yes Yes  Number falls in past yr: - 1 1 1 2  or more  Injury with Fall? - Yes No Yes -   Functional Status Survey:    Vitals:   11/05/16 1603  BP: 128/66  Pulse: 79  Resp: 18  Temp: 98.6 F (37 C)  TempSrc: Oral  SpO2: 95%  Weight: 141 lb (  64 kg)  Height: 5' (1.524 m)   Body mass index is 27.54 kg/m. Physical Exam  Constitutional: She appears well-developed and well-nourished. No distress.  HENT:  Head: Normocephalic and atraumatic.  Mouth/Throat: Oropharynx is clear and moist.  No sinus tenderness, clear nasal discharge  Eyes: Conjunctivae are normal. Pupils are equal, round, and reactive to light. Right eye exhibits no discharge. Left eye exhibits no discharge.  Watery eyes  Neck: Normal range of motion. Neck supple.  Cardiovascular: Normal rate and regular rhythm.   Pulmonary/Chest: Effort normal and breath sounds normal. No respiratory distress. She has no wheezes. She has no rales.  Abdominal: Soft. Bowel sounds are normal. There is no tenderness. There is no guarding.  Lymphadenopathy:    She has no cervical adenopathy.  Neurological: She is alert.  Oriented to self only.   Skin: Skin is warm and dry. She is not diaphoretic.    Psychiatric:  Appears anxious, poor insight    Labs reviewed:  Recent Labs  03/05/16 1730 03/30/16 2033 07/17/16  NA 140 139 144  K 4.8 4.6 4.0  CL 105 105  --   CO2 29  --   --   GLUCOSE 99 89  --   BUN 31* 33* 26*  CREATININE 0.95 0.90 0.7  CALCIUM 8.7*  --   --     Recent Labs  03/05/16 1730 07/17/16  AST 27 21  ALT 19 11  ALKPHOS 82 77  BILITOT 0.5  --   PROT 6.4*  --   ALBUMIN 3.5  --     Recent Labs  12/13/15 03/05/16 1730 03/30/16 2033 07/17/16  WBC 6.1 5.9  --  6.7  NEUTROABS  --  3.9  --   --   HGB 14.1 12.6 13.3 13.4  HCT  --  39.4 39.0 41  MCV  --  99.5  --   --   PLT 190 173  --  199   Lab Results  Component Value Date   TSH 1.51 07/17/2016   Lab Results  Component Value Date   HGBA1C 5.4 03/08/2015   Lab Results  Component Value Date   CHOL 235 (H) 03/06/2009   HDL 108.40 03/06/2009   LDLDIRECT 108.2 03/06/2009    Urinalysis 10/22/16 Color- yellow, appearance- cloudy, occult blood- 3+, protein- 1+, nitrite- negative, leukocyte esterase- trace, wbc- 10-20, squamous epithelial cells- 0-5, bacteria- none seen, hyaline cast- none seen.  Significant Diagnostic Results in last 30 days:  No results found.   Assessment/Plan  Dementia with behavior changes Currently on memantine and donepezil with phenytoin. Check phenytoin level. Add seroquel 12.5 mg daily at 1 pm to help with behavior changes and monitor  Seizure disorder Per nursing, there was concern for seizure this am, they witnessed jerking movement of her arms, provider not notified until now. On exam, she is at her baseline. check phenytoin level and adjust dosing if indicated  musculoskeletal pain Has back pain. Complaints of generalized body ache x 1 day. No diagnosed fibromyalgia. Currently on cymbalta. If pain persists or worsens, rule out inflammation and consider to increase cymbalta dosing. Provide tylenol 650 mg q8h prn for 1 week for now.   Allergic rhinitis Start  claritin 10 mg daily for now. D/c mucinex and robafen for now. Continue fluticasone-vilanterol daily    Family/ staff Communication: reviewed care plan with patient and charge nurse.    Labs/tests ordered:  None  [name], MD Internal Medicine [facility] [address] [place], [place] [address] Cell Phone (Monday-Friday 8 am - 5 pm): [phone] On Call: [phone] and follow prompts after 5 pm and on weekends Office Phone: [phone] Office Fax: [phone]

## 2016-11-06 DIAGNOSIS — R2681 Unsteadiness on feet: Secondary | ICD-10-CM | POA: Diagnosis not present

## 2016-11-06 DIAGNOSIS — R41841 Cognitive communication deficit: Secondary | ICD-10-CM | POA: Diagnosis not present

## 2016-11-06 DIAGNOSIS — M25552 Pain in left hip: Secondary | ICD-10-CM | POA: Diagnosis not present

## 2016-11-06 DIAGNOSIS — R569 Unspecified convulsions: Secondary | ICD-10-CM | POA: Diagnosis not present

## 2016-11-06 DIAGNOSIS — R296 Repeated falls: Secondary | ICD-10-CM | POA: Diagnosis not present

## 2016-11-06 DIAGNOSIS — M6281 Muscle weakness (generalized): Secondary | ICD-10-CM | POA: Diagnosis not present

## 2016-11-06 LAB — PHENYTOIN LEVEL, TOTAL: PHENYTOIN: 10.4

## 2016-11-10 DIAGNOSIS — M6281 Muscle weakness (generalized): Secondary | ICD-10-CM | POA: Diagnosis not present

## 2016-11-10 DIAGNOSIS — R2681 Unsteadiness on feet: Secondary | ICD-10-CM | POA: Diagnosis not present

## 2016-11-10 DIAGNOSIS — R41841 Cognitive communication deficit: Secondary | ICD-10-CM | POA: Diagnosis not present

## 2016-11-10 DIAGNOSIS — R296 Repeated falls: Secondary | ICD-10-CM | POA: Diagnosis not present

## 2016-11-10 DIAGNOSIS — M25552 Pain in left hip: Secondary | ICD-10-CM | POA: Diagnosis not present

## 2016-11-11 DIAGNOSIS — M6281 Muscle weakness (generalized): Secondary | ICD-10-CM | POA: Diagnosis not present

## 2016-11-11 DIAGNOSIS — M25552 Pain in left hip: Secondary | ICD-10-CM | POA: Diagnosis not present

## 2016-11-11 DIAGNOSIS — R296 Repeated falls: Secondary | ICD-10-CM | POA: Diagnosis not present

## 2016-11-11 DIAGNOSIS — R41841 Cognitive communication deficit: Secondary | ICD-10-CM | POA: Diagnosis not present

## 2016-11-11 DIAGNOSIS — R2681 Unsteadiness on feet: Secondary | ICD-10-CM | POA: Diagnosis not present

## 2016-11-12 DIAGNOSIS — M6281 Muscle weakness (generalized): Secondary | ICD-10-CM | POA: Diagnosis not present

## 2016-11-12 DIAGNOSIS — R41841 Cognitive communication deficit: Secondary | ICD-10-CM | POA: Diagnosis not present

## 2016-11-12 DIAGNOSIS — R2681 Unsteadiness on feet: Secondary | ICD-10-CM | POA: Diagnosis not present

## 2016-11-12 DIAGNOSIS — R296 Repeated falls: Secondary | ICD-10-CM | POA: Diagnosis not present

## 2016-11-12 DIAGNOSIS — M25552 Pain in left hip: Secondary | ICD-10-CM | POA: Diagnosis not present

## 2016-11-13 ENCOUNTER — Encounter (HOSPITAL_COMMUNITY): Payer: Self-pay | Admitting: Emergency Medicine

## 2016-11-13 ENCOUNTER — Emergency Department (HOSPITAL_COMMUNITY): Payer: Medicare Other

## 2016-11-13 ENCOUNTER — Inpatient Hospital Stay (HOSPITAL_COMMUNITY)
Admission: EM | Admit: 2016-11-13 | Discharge: 2016-11-17 | DRG: 853 | Disposition: A | Discharge status: Skilled Nursing Facility | Payer: Medicare Other | Attending: Internal Medicine | Admitting: Internal Medicine

## 2016-11-13 DIAGNOSIS — M19041 Primary osteoarthritis, right hand: Secondary | ICD-10-CM | POA: Diagnosis present

## 2016-11-13 DIAGNOSIS — N200 Calculus of kidney: Secondary | ICD-10-CM | POA: Diagnosis present

## 2016-11-13 DIAGNOSIS — R262 Difficulty in walking, not elsewhere classified: Secondary | ICD-10-CM | POA: Diagnosis not present

## 2016-11-13 DIAGNOSIS — Z961 Presence of intraocular lens: Secondary | ICD-10-CM | POA: Diagnosis present

## 2016-11-13 DIAGNOSIS — R1311 Dysphagia, oral phase: Secondary | ICD-10-CM | POA: Diagnosis not present

## 2016-11-13 DIAGNOSIS — A419 Sepsis, unspecified organism: Principal | ICD-10-CM | POA: Diagnosis present

## 2016-11-13 DIAGNOSIS — J849 Interstitial pulmonary disease, unspecified: Secondary | ICD-10-CM | POA: Diagnosis present

## 2016-11-13 DIAGNOSIS — Z96653 Presence of artificial knee joint, bilateral: Secondary | ICD-10-CM | POA: Diagnosis present

## 2016-11-13 DIAGNOSIS — C672 Malignant neoplasm of lateral wall of bladder: Secondary | ICD-10-CM | POA: Diagnosis not present

## 2016-11-13 DIAGNOSIS — M810 Age-related osteoporosis without current pathological fracture: Secondary | ICD-10-CM | POA: Diagnosis present

## 2016-11-13 DIAGNOSIS — R278 Other lack of coordination: Secondary | ICD-10-CM | POA: Diagnosis not present

## 2016-11-13 DIAGNOSIS — R41841 Cognitive communication deficit: Secondary | ICD-10-CM | POA: Diagnosis not present

## 2016-11-13 DIAGNOSIS — G8929 Other chronic pain: Secondary | ICD-10-CM | POA: Diagnosis present

## 2016-11-13 DIAGNOSIS — R310 Gross hematuria: Secondary | ICD-10-CM | POA: Diagnosis not present

## 2016-11-13 DIAGNOSIS — E039 Hypothyroidism, unspecified: Secondary | ICD-10-CM | POA: Diagnosis present

## 2016-11-13 DIAGNOSIS — N3289 Other specified disorders of bladder: Secondary | ICD-10-CM | POA: Diagnosis not present

## 2016-11-13 DIAGNOSIS — R4789 Other speech disturbances: Secondary | ICD-10-CM | POA: Diagnosis not present

## 2016-11-13 DIAGNOSIS — F411 Generalized anxiety disorder: Secondary | ICD-10-CM | POA: Diagnosis present

## 2016-11-13 DIAGNOSIS — Z66 Do not resuscitate: Secondary | ICD-10-CM | POA: Diagnosis present

## 2016-11-13 DIAGNOSIS — J9621 Acute and chronic respiratory failure with hypoxia: Secondary | ICD-10-CM | POA: Diagnosis not present

## 2016-11-13 DIAGNOSIS — Z86718 Personal history of other venous thrombosis and embolism: Secondary | ICD-10-CM | POA: Diagnosis not present

## 2016-11-13 DIAGNOSIS — F329 Major depressive disorder, single episode, unspecified: Secondary | ICD-10-CM | POA: Diagnosis present

## 2016-11-13 DIAGNOSIS — I2782 Chronic pulmonary embolism: Secondary | ICD-10-CM | POA: Diagnosis present

## 2016-11-13 DIAGNOSIS — F0391 Unspecified dementia with behavioral disturbance: Secondary | ICD-10-CM | POA: Diagnosis present

## 2016-11-13 DIAGNOSIS — M19042 Primary osteoarthritis, left hand: Secondary | ICD-10-CM | POA: Diagnosis present

## 2016-11-13 DIAGNOSIS — R2681 Unsteadiness on feet: Secondary | ICD-10-CM | POA: Diagnosis not present

## 2016-11-13 DIAGNOSIS — R296 Repeated falls: Secondary | ICD-10-CM | POA: Diagnosis not present

## 2016-11-13 DIAGNOSIS — G9340 Encephalopathy, unspecified: Secondary | ICD-10-CM

## 2016-11-13 DIAGNOSIS — Z538 Procedure and treatment not carried out for other reasons: Secondary | ICD-10-CM | POA: Diagnosis present

## 2016-11-13 DIAGNOSIS — F0390 Unspecified dementia without behavioral disturbance: Secondary | ICD-10-CM | POA: Diagnosis present

## 2016-11-13 DIAGNOSIS — Z881 Allergy status to other antibiotic agents status: Secondary | ICD-10-CM

## 2016-11-13 DIAGNOSIS — Z79899 Other long term (current) drug therapy: Secondary | ICD-10-CM | POA: Diagnosis not present

## 2016-11-13 DIAGNOSIS — Z885 Allergy status to narcotic agent status: Secondary | ICD-10-CM

## 2016-11-13 DIAGNOSIS — G40909 Epilepsy, unspecified, not intractable, without status epilepticus: Secondary | ICD-10-CM | POA: Diagnosis not present

## 2016-11-13 DIAGNOSIS — R9431 Abnormal electrocardiogram [ECG] [EKG]: Secondary | ICD-10-CM | POA: Diagnosis not present

## 2016-11-13 DIAGNOSIS — M25552 Pain in left hip: Secondary | ICD-10-CM | POA: Diagnosis not present

## 2016-11-13 DIAGNOSIS — F03918 Unspecified dementia, unspecified severity, with other behavioral disturbance: Secondary | ICD-10-CM | POA: Diagnosis present

## 2016-11-13 DIAGNOSIS — R509 Fever, unspecified: Secondary | ICD-10-CM | POA: Diagnosis not present

## 2016-11-13 DIAGNOSIS — K219 Gastro-esophageal reflux disease without esophagitis: Secondary | ICD-10-CM | POA: Diagnosis not present

## 2016-11-13 DIAGNOSIS — M6281 Muscle weakness (generalized): Secondary | ICD-10-CM | POA: Diagnosis not present

## 2016-11-13 DIAGNOSIS — K5730 Diverticulosis of large intestine without perforation or abscess without bleeding: Secondary | ICD-10-CM | POA: Diagnosis not present

## 2016-11-13 DIAGNOSIS — C679 Malignant neoplasm of bladder, unspecified: Secondary | ICD-10-CM | POA: Diagnosis present

## 2016-11-13 DIAGNOSIS — D494 Neoplasm of unspecified behavior of bladder: Secondary | ICD-10-CM | POA: Diagnosis not present

## 2016-11-13 DIAGNOSIS — G9341 Metabolic encephalopathy: Secondary | ICD-10-CM | POA: Diagnosis present

## 2016-11-13 DIAGNOSIS — E785 Hyperlipidemia, unspecified: Secondary | ICD-10-CM | POA: Diagnosis present

## 2016-11-13 DIAGNOSIS — R059 Cough, unspecified: Secondary | ICD-10-CM

## 2016-11-13 DIAGNOSIS — R05 Cough: Secondary | ICD-10-CM | POA: Diagnosis not present

## 2016-11-13 DIAGNOSIS — Z888 Allergy status to other drugs, medicaments and biological substances status: Secondary | ICD-10-CM

## 2016-11-13 DIAGNOSIS — F0151 Vascular dementia with behavioral disturbance: Secondary | ICD-10-CM | POA: Diagnosis not present

## 2016-11-13 DIAGNOSIS — R4182 Altered mental status, unspecified: Secondary | ICD-10-CM

## 2016-11-13 DIAGNOSIS — R100 Acute abdomen: Secondary | ICD-10-CM | POA: Diagnosis not present

## 2016-11-13 DIAGNOSIS — Z87891 Personal history of nicotine dependence: Secondary | ICD-10-CM

## 2016-11-13 DIAGNOSIS — Z820 Family history of epilepsy and other diseases of the nervous system: Secondary | ICD-10-CM

## 2016-11-13 DIAGNOSIS — R319 Hematuria, unspecified: Secondary | ICD-10-CM

## 2016-11-13 LAB — PROCALCITONIN: Procalcitonin: <0.1 ng/mL

## 2016-11-13 LAB — URINALYSIS, ROUTINE W REFLEX MICROSCOPIC
Bilirubin Urine: NEGATIVE
GLUCOSE, UA: NEGATIVE mg/dL
Ketones, ur: NEGATIVE mg/dL
Leukocytes, UA: NEGATIVE
Nitrite: NEGATIVE
PH: 7 (ref 5.0–8.0)
Protein, ur: 30 mg/dL — AB
SPECIFIC GRAVITY, URINE: 1.018 (ref 1.005–1.030)

## 2016-11-13 LAB — PHENYTOIN LEVEL, TOTAL: PHENYTOIN LVL: 7.9 ug/mL — AB (ref 10.0–20.0)

## 2016-11-13 LAB — CBC WITH DIFFERENTIAL/PLATELET
BASOS ABS: 0 10*3/uL (ref 0.0–0.1)
BASOS PCT: 0 %
EOS PCT: 0 %
Eosinophils Absolute: 0 10*3/uL (ref 0.0–0.7)
HEMATOCRIT: 39 % (ref 36.0–46.0)
Hemoglobin: 12.7 g/dL (ref 12.0–15.0)
Lymphocytes Relative: 9 %
Lymphs Abs: 1 10*3/uL (ref 0.7–4.0)
MCH: 31.8 pg (ref 26.0–34.0)
MCHC: 32.6 g/dL (ref 30.0–36.0)
MCV: 97.7 fL (ref 78.0–100.0)
MONO ABS: 0.8 10*3/uL (ref 0.1–1.0)
MONOS PCT: 7 %
NEUTROS ABS: 9.5 10*3/uL — AB (ref 1.7–7.7)
Neutrophils Relative %: 84 %
PLATELETS: 136 10*3/uL — AB (ref 150–400)
RBC: 3.99 MIL/uL (ref 3.87–5.11)
RDW: 13.4 % (ref 11.5–15.5)
WBC: 11.3 10*3/uL — ABNORMAL HIGH (ref 4.0–10.5)

## 2016-11-13 LAB — COMPREHENSIVE METABOLIC PANEL
ALBUMIN: 3 g/dL — AB (ref 3.5–5.0)
ALK PHOS: 74 U/L (ref 38–126)
ALT: 17 U/L (ref 14–54)
ANION GAP: 5 (ref 5–15)
AST: 24 U/L (ref 15–41)
BILIRUBIN TOTAL: 0.3 mg/dL (ref 0.3–1.2)
BUN: 18 mg/dL (ref 6–20)
CO2: 25 mmol/L (ref 22–32)
Calcium: 8.2 mg/dL — ABNORMAL LOW (ref 8.9–10.3)
Chloride: 106 mmol/L (ref 101–111)
Creatinine, Ser: 0.75 mg/dL (ref 0.44–1.00)
GFR calc Af Amer: >60 mL/min (ref >60)
GFR calc non Af Amer: >60 mL/min (ref >60)
GLUCOSE: 123 mg/dL — AB (ref 65–99)
Potassium: 4 mmol/L (ref 3.5–5.1)
SODIUM: 136 mmol/L (ref 135–145)
TOTAL PROTEIN: 5.9 g/dL — AB (ref 6.5–8.1)

## 2016-11-13 LAB — I-STAT CG4 LACTIC ACID, ED
LACTIC ACID, VENOUS: 0.57 mmol/L (ref 0.5–1.9)
Lactic Acid, Venous: 0.88 mmol/L (ref 0.5–1.9)

## 2016-11-13 LAB — PROTIME-INR
INR: 1.1
PROTHROMBIN TIME: 14.2 s (ref 11.4–15.2)

## 2016-11-13 LAB — TSH: TSH: 0.533 u[IU]/mL (ref 0.350–4.500)

## 2016-11-13 MED ORDER — PANTOPRAZOLE SODIUM 40 MG PO TBEC
40.0000 mg | DELAYED_RELEASE_TABLET | Freq: Every day | ORAL | Status: DC
  Administered 2016-11-14 – 2016-11-17 (×4): 40 mg via ORAL
  Filled 2016-11-13 (×4): qty 1

## 2016-11-13 MED ORDER — ACETAMINOPHEN 325 MG PO TABS: 650.0000 mg | ORAL_TABLET | Freq: Four times a day (QID) | ORAL | Status: DC | PRN

## 2016-11-13 MED ORDER — VITAMIN D 1000 UNITS PO TABS
5000.0000 [IU] | ORAL_TABLET | Freq: Every day | ORAL | Status: DC
  Administered 2016-11-14 – 2016-11-17 (×4): 5000 [IU] via ORAL
  Filled 2016-11-13 (×4): qty 5

## 2016-11-13 MED ORDER — FLUTICASONE FUROATE-VILANTEROL 100-25 MCG/INH IN AEPB
1.0000 | INHALATION_SPRAY | Freq: Every day | RESPIRATORY_TRACT | Status: DC
  Administered 2016-11-14 – 2016-11-16 (×3): 1 via RESPIRATORY_TRACT
  Filled 2016-11-13: qty 28

## 2016-11-13 MED ORDER — PHENYTOIN SODIUM EXTENDED 100 MG PO CAPS
100.0000 mg | ORAL_CAPSULE | Freq: Every day | ORAL | Status: DC
  Administered 2016-11-14 – 2016-11-17 (×4): 100 mg via ORAL
  Filled 2016-11-13 (×5): qty 1

## 2016-11-13 MED ORDER — CALCIUM CARBONATE-VITAMIN D 500-200 MG-UNIT PO TABS
2.0000 | ORAL_TABLET | Freq: Every day | ORAL | Status: DC
  Administered 2016-11-14 – 2016-11-17 (×4): 2 via ORAL
  Filled 2016-11-13 (×4): qty 2

## 2016-11-13 MED ORDER — ONDANSETRON HCL 4 MG/2ML IJ SOLN: 4.0000 mg | Freq: Four times a day (QID) | INTRAMUSCULAR | Status: DC | PRN

## 2016-11-13 MED ORDER — ADULT MULTIVITAMIN W/MINERALS CH
1.0000 | ORAL_TABLET | Freq: Every day | ORAL | Status: DC
  Administered 2016-11-14 – 2016-11-17 (×4): 1 via ORAL
  Filled 2016-11-13 (×4): qty 1

## 2016-11-13 MED ORDER — SODIUM CHLORIDE 0.9% FLUSH
3.0000 mL | Freq: Two times a day (BID) | INTRAVENOUS | Status: DC
  Administered 2016-11-14 – 2016-11-17 (×5): 3 mL via INTRAVENOUS

## 2016-11-13 MED ORDER — PHENYTOIN SODIUM EXTENDED 100 MG PO CAPS
200.0000 mg | ORAL_CAPSULE | Freq: Every day | ORAL | Status: DC
  Administered 2016-11-14 – 2016-11-16 (×3): 200 mg via ORAL
  Filled 2016-11-13 (×4): qty 2

## 2016-11-13 MED ORDER — MEMANTINE HCL 10 MG PO TABS
10.0000 mg | ORAL_TABLET | Freq: Two times a day (BID) | ORAL | Status: DC
  Administered 2016-11-14 – 2016-11-17 (×6): 10 mg via ORAL
  Filled 2016-11-13 (×7): qty 1

## 2016-11-13 MED ORDER — DULOXETINE HCL 30 MG PO CPEP
30.0000 mg | ORAL_CAPSULE | Freq: Every day | ORAL | Status: DC
  Administered 2016-11-14 – 2016-11-17 (×4): 30 mg via ORAL
  Filled 2016-11-13 (×4): qty 1

## 2016-11-13 MED ORDER — MUSCLE RUB 10-15 % EX CREA
1.0000 | TOPICAL_CREAM | Freq: Two times a day (BID) | CUTANEOUS | Status: DC | PRN
Start: 2016-11-13 — End: 2016-11-17

## 2016-11-13 MED ORDER — VANCOMYCIN HCL IN DEXTROSE 1-5 GM/200ML-% IV SOLN
1000.0000 mg | Freq: Once | INTRAVENOUS | Status: DC
  Filled 2016-11-13: qty 200

## 2016-11-13 MED ORDER — CLONAZEPAM 0.5 MG PO TABS
0.5000 mg | ORAL_TABLET | Freq: Two times a day (BID) | ORAL | Status: DC
  Administered 2016-11-14 – 2016-11-17 (×8): 0.5 mg via ORAL
  Filled 2016-11-13 (×9): qty 1

## 2016-11-13 MED ORDER — OMEGA-3 1000 MG PO CAPS: 1.0000 | ORAL_CAPSULE | Freq: Every day | ORAL | Status: DC

## 2016-11-13 MED ORDER — ZOLPIDEM TARTRATE 5 MG PO TABS
5.0000 mg | ORAL_TABLET | Freq: Every evening | ORAL | Status: DC | PRN
  Administered 2016-11-14: 5 mg via ORAL
  Filled 2016-11-13 (×2): qty 1

## 2016-11-13 MED ORDER — SODIUM CHLORIDE 0.9 % IV BOLUS (SEPSIS)
1500.0000 mL | Freq: Once | INTRAVENOUS | Status: AC
  Administered 2016-11-13: 1500 mL via INTRAVENOUS

## 2016-11-13 MED ORDER — LIDOCAINE HCL (PF) 1 % IJ SOLN
30.0000 mL | Freq: Once | INTRAMUSCULAR | Status: AC
  Administered 2016-11-13: 30 mL via INTRADERMAL
  Filled 2016-11-13: qty 30

## 2016-11-13 MED ORDER — SODIUM CHLORIDE 0.9 % IV SOLN
INTRAVENOUS | Status: DC
  Administered 2016-11-14 – 2016-11-16 (×3): via INTRAVENOUS

## 2016-11-13 MED ORDER — DONEPEZIL HCL 10 MG PO TABS
10.0000 mg | ORAL_TABLET | Freq: Every day | ORAL | Status: DC
  Administered 2016-11-14 – 2016-11-17 (×4): 10 mg via ORAL
  Filled 2016-11-13 (×4): qty 1

## 2016-11-13 MED ORDER — ONDANSETRON HCL 4 MG PO TABS
4.0000 mg | ORAL_TABLET | Freq: Four times a day (QID) | ORAL | Status: DC | PRN
Start: 2016-11-13 — End: 2016-11-17

## 2016-11-13 MED ORDER — DEXTROSE 5 % IV SOLN
2.0000 g | INTRAVENOUS | Status: DC
  Administered 2016-11-14 – 2016-11-15 (×2): 2 g via INTRAVENOUS
  Filled 2016-11-13 (×5): qty 2

## 2016-11-13 MED ORDER — LEVOTHYROXINE SODIUM 100 MCG PO TABS
100.0000 ug | ORAL_TABLET | Freq: Every day | ORAL | Status: DC
  Administered 2016-11-14 – 2016-11-17 (×4): 100 ug via ORAL
  Filled 2016-11-13 (×4): qty 1

## 2016-11-13 MED ORDER — VANCOMYCIN HCL 500 MG IV SOLR
500.0000 mg | Freq: Two times a day (BID) | INTRAVENOUS | Status: DC
  Administered 2016-11-14 – 2016-11-16 (×6): 500 mg via INTRAVENOUS
  Filled 2016-11-13 (×6): qty 500

## 2016-11-13 MED ORDER — GUAIFENESIN ER 600 MG PO TB12
600.0000 mg | ORAL_TABLET | Freq: Every day | ORAL | Status: DC
  Administered 2016-11-14 – 2016-11-16 (×3): 600 mg via ORAL
  Filled 2016-11-13 (×4): qty 1

## 2016-11-13 NOTE — ED Notes (Signed)
Family present at bedside stating the pt had a UTI approximately 3 weeks with similar symptoms.

## 2016-11-13 NOTE — ED Notes (Signed)
Patient transported to X-ray 

## 2016-11-13 NOTE — ED Provider Notes (Signed)
[facility] DEPT Provider Note   CSN: [id number] Arrival date & time: 11/13/16  1844     History   Chief Complaint Chief Complaint  Patient presents with  . Fever    HPI Rebecca [name] is a 83 y.o. female.  The history is provided by the EMS personnel and a relative.    Patient is an 83 year old female with past medical history significant for dementia, PE (no anticoagulation), seizure disorder, hypothyroidism, who presents to the emergency department with fever and altered mental status. Patient is a nursing facility and was found to have a temperature 102.5 today. Per report states that she was not acting her normal self and was more confused and somnolent. No recent history of cough, congestion, shortness of breath, emesis. No rashes. Recently treated with antibiotics for urinary tract infection 3 weeks ago.  Past Medical History:  Diagnosis Date  . Anemia, unspecified 03/18/2011  . Anxiety state, unspecified 01/2011  . Blood in stool 06/15/2012  . Corns and callosities 07/01/2011  . Diverticulosis 02/2011  . Dizziness   . DJD (degenerative joint disease)   . Dysuria 06/17/2011  . Hemorrhoids 02/2011  . Low back pain   . Mitral valve problem thickened   thickened  . Osteoarthritis of both hands 10/09/2015  . Osteoporosis 02/2011  . Other abnormal blood chemistry 0/30/2012  . Other acquired deformity of toe 12/16/2011  . Pain in joint, site unspecified 01/2012  . 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 Sacroiliitis, not elsewhere classified ([place]) 05/2011  . Seizures ([place])    Remotely  . Unspecified hypothyroidism 01/2011  . 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 Vitamin D deficiency 01/2011    Patient Active Problem List   Diagnosis Date Noted  . Acute metabolic encephalopathy 09/38/1829  . GERD (gastroesophageal reflux disease) 05/20/2016  . Torus palatinus 04/18/2016  . Osteoarthritis, multiple sites 10/09/2015   . Back pain 06/01/2015  . Unstable gait 04/16/2015  . Abnormal finding on EKG-new inferior/ septal TWI 03/28/2015  . DNR no code (do not resuscitate) 03/28/2015  . DVT, femoral, acute ([place]) 03/28/2015  . Chronic pulmonary embolism ([place]) 03/28/2015  . Dyslipidemia 03/12/2015  . Sepsis ([place]) 03/11/2015  . Seizure disorder ([place]) 03/11/2015  . Depression 09/26/2014  . Dementia 09/19/2012  . Hypothyroidism 12/08/2006    Past Surgical History:  Procedure Laterality Date  . bletheroplasty    . CATARACT EXTRACTION W/ INTRAOCULAR LENS  IMPLANT, BILATERAL  2010  . CERVICAL LAMINECTOMY  2005  . HAMMER TOE SURGERY  2013   right 2nd toe Dr. Mallie Mussel  . JOINT REPLACEMENT Bilateral 2002 Right, 1992 Left   knees    OB History    No data available       Home Medications    Prior to Admission medications   Medication Sig Start Date End Date Taking? Authorizing Provider  acetaminophen (TYLENOL) 325 MG tablet Take 650 mg by mouth 2 (two) times daily. Take 2 tablets twice daily    Yes [provider]  amoxicillin (AMOXIL) 500 MG capsule Take 2,000 mg by mouth as directed. 1 Hour Before Procedure.   Yes [provider]  Calcium Carbonate-Vitamin D (CALCIUM 600+D) 600-400 MG-UNIT tablet Take 1 tablet by mouth daily.   Yes [provider]  Cholecalciferol (VITAMIN D3) 5000 units TABS Take 1 tablet by mouth daily. Take one tablet daily    Yes [provider]  clonazePAM (KLONOPIN) 0.5 MG tablet Take one  tablet by mouth 2 times a day at 8 am and 8 pm. 06/03/16  Yes Lauree Chandler, NP  donepezil (ARICEPT) 10 MG tablet Take 10 mg by mouth daily.   Yes [provider]  DULoxetine (CYMBALTA) 30 MG capsule Take 30 mg by mouth. Take one tablet daily   Yes [provider]  fluticasone furoate-vilanterol (BREO ELLIPTA) 100-25 MCG/INH AEPB Inhale 1 puff into the lungs daily.   Yes [provider]  levothyroxine (SYNTHROID, LEVOTHROID) 100 MCG  tablet Take 1 tablet (100 mcg total) by mouth daily before breakfast. For thyroid 09/28/14  Yes Estill Dooms, MD  loratadine (CLARITIN) 10 MG tablet Take 10 mg by mouth daily. For 2 weeks   Yes [provider]  meloxicam (MOBIC) 7.5 MG tablet Take 7.5 mg by mouth daily as needed for pain.    Yes [provider]  memantine (NAMENDA) 10 MG tablet Take 10 mg by mouth 2 (two) times daily.   Yes [provider]  Multiple Vitamins-Minerals (THERA M PLUS PO) Take 1 tablet by mouth daily.    Yes [provider]  Omega-3 1000 MG CAPS Take 1 capsule by mouth daily. Take one capsule every morning    Yes [provider]  omeprazole (PRILOSEC) 20 MG capsule Take 20 mg by mouth at bedtime.   Yes [provider]  phenytoin (DILANTIN) 100 MG ER capsule Take 100-200 mg by mouth See admin instructions. Take one capsule every morning; take 2 capsules at bedtime    Yes [provider]  QUEtiapine (SEROQUEL) 25 MG tablet Take 12.5 mg by mouth daily. At 1 pm   Yes [provider]  triamcinolone cream (KENALOG) 0.1 % Apply 1 application topically daily as needed (for rash).  03/06/16  Yes [provider]  trolamine salicylate (ASPERCREME) 10 % cream Apply 1 application topically. Apply as needed   Yes [provider]    Family History Family History  Problem Relation Age of Onset  . Alzheimer's disease Sister   . Emphysema Sister     Social History Social History  Substance Use Topics  . Smoking status: Former Smoker    Quit date: 09/14/1988  . Smokeless tobacco: Never Used     Comment: Smoked <1ppd  . Alcohol use 0.6 oz/week    1 Glasses of wine per week     Comment: doesn't drink routinely & only on social occasions     Allergies   Macrodantin [nitrofurantoin]; Morphine and related; and Sulfa antibiotics   Review of Systems Review of Systems  Unable to perform ROS: Dementia     Physical Exam Updated  Vital Signs BP (!) 108/57   Pulse 76   Temp (!) 101.3 F (38.5 C) (Rectal)   Resp 16   Ht 5' (1.524 m)   Wt 64 kg (141 lb)   SpO2 98%   BMI 27.54 kg/m   Physical Exam  Constitutional: She appears well-developed and well-nourished. No distress.  HENT:  Head: Atraumatic.  Eyes: Conjunctivae and EOM are normal. Pupils are equal, round, and reactive to light.  Neck: Normal range of motion. Neck supple.  Cardiovascular: Normal rate, regular rhythm, normal heart sounds and intact distal pulses.   No murmur heard. Pulmonary/Chest: Effort normal and breath sounds normal. No respiratory distress.  Abdominal: She exhibits no distension and no mass. There is no tenderness. There is no guarding.  Musculoskeletal: Normal range of motion. She exhibits no edema.  No unilateral leg swelling  Neurological: She is alert.  Oriented to person. Moving all extremities. Able to follow commands.  Skin: Skin is warm. Capillary refill takes less than 2 seconds. No rash noted.     ED Treatments / Results  Labs (all labs ordered are listed, but only abnormal results are displayed) Labs Reviewed  COMPREHENSIVE METABOLIC PANEL - Abnormal; Notable for the following:       Result Value   Glucose, Bld 123 (*)    Calcium 8.2 (*)    Total Protein 5.9 (*)    Albumin 3.0 (*)    All other components within normal limits  CBC WITH DIFFERENTIAL/PLATELET - Abnormal; Notable for the following:    WBC 11.3 (*)    Platelets 136 (*)    Neutro Abs 9.5 (*)    All other components within normal limits  URINALYSIS, ROUTINE W REFLEX MICROSCOPIC - Abnormal; Notable for the following:    Hgb urine dipstick LARGE (*)    Protein, ur 30 (*)    Bacteria, UA RARE (*)    Squamous Epithelial / LPF 0-5 (*)    All other components within normal limits  PHENYTOIN LEVEL, TOTAL - Abnormal; Notable for the following:    Phenytoin Lvl 7.9 (*)    All other components within normal limits  CULTURE, BLOOD (ROUTINE X 2)  CULTURE,  BLOOD (ROUTINE X 2)  URINE CULTURE  TSH  PROTIME-INR  PROCALCITONIN  T3, FREE  PROTEIN, CSF  LACTIC ACID, PLASMA  LACTIC ACID, PLASMA  I-STAT CG4 LACTIC ACID, ED  I-STAT CG4 LACTIC ACID, ED    EKG  EKG Interpretation  Date/Time:  Thursday November 13 2016 19:01:35 EDT Ventricular Rate:  90 PR Interval:    QRS Duration: 85 QT Interval:  370 QTC Calculation: 453 R Axis:   -38 Text Interpretation:  Sinus rhythm Probable left atrial enlargement Inferior infarct, old No significant change since last tracing Confirmed by Alvino Chapel  MD, NATHAN [phone]) on 11/13/2016 7:04:27 PM       Radiology Dg Chest 2 View  Result Date: 11/13/2016 CLINICAL DATA:  Fever and decreased mental status EXAM: CHEST  2 VIEW COMPARISON:  04/02/2015.  03/28/2015. FINDINGS: AP and lateral views of the chest show enlargement of the cardiopericardial silhouette. There is diffuse underlying chronic interstitial lung disease, as before. No definite superimposed pulmonary edema or focal pneumonia. No substantial pleural effusion. The visualized bony structures of the thorax are intact. Telemetry leads overlie the chest. IMPRESSION: Chronic underlying interstitial lung disease, similar to prior studies. No definite superimposed focal pneumonia or pulmonary edema at this time. Electronically Signed   By: Misty Stanley M.D.   On: 11/13/2016 20:09   Ct Head Wo Contrast  Result Date: 11/13/2016 CLINICAL DATA:  83 year old female with altered mental status and fever. EXAM: CT HEAD WITHOUT CONTRAST TECHNIQUE: Contiguous axial images were obtained from the base of the skull through the vertex without intravenous contrast. COMPARISON:  03/30/2016 and prior exams FINDINGS: Brain: No evidence of acute infarction, hemorrhage, hydrocephalus, extra-axial collection or mass lesion/mass effect. Atrophy, chronic small-vessel white matter ischemic changes and remote left occipital infarct again noted. Vascular: Intracranial atherosclerotic  calcifications noted. Skull: Normal. Negative for fracture or focal lesion. Sinuses/Orbits: No acute finding. Other: None. IMPRESSION: No at evidence of acute intracranial abnormality. Atrophy, chronic small-vessel white matter ischemic changes and remote left occipital infarct. Electronically Signed   By: Margarette Canada M.D.   On: 11/13/2016 22:47    Procedures Procedures (including critical care time)  Medications  Ordered in ED Medications  0.9 %  sodium chloride infusion (not administered)  DULoxetine (CYMBALTA) DR capsule 30 mg (not administered)  guaiFENesin (MUCINEX) 12 hr tablet 600 mg (not administered)  multivitamin with minerals tablet 1 tablet (not administered)  pantoprazole (PROTONIX) EC tablet 40 mg (not administered)  MUSCLE RUB CREA 1 application (not administered)  clonazePAM (KLONOPIN) tablet 0.5 mg (not administered)  donepezil (ARICEPT) tablet 10 mg (not administered)  cholecalciferol (VITAMIN D) tablet 5,000 Units (not administered)  memantine (NAMENDA) tablet 10 mg (not administered)  acetaminophen (TYLENOL) tablet 650 mg (not administered)  Omega-3 CAPS 1,000 mg (not administered)  phenytoin (DILANTIN) ER capsule 200 mg (not administered)  fluticasone furoate-vilanterol (BREO ELLIPTA) 100-25 MCG/INH 1 puff (not administered)  Calcium Carbonate-Vitamin D 600-400 MG-UNIT 1 tablet (not administered)  levothyroxine (SYNTHROID, LEVOTHROID) tablet 100 mcg (not administered)  sodium chloride flush (NS) 0.9 % injection 3 mL (not administered)  ondansetron (ZOFRAN) tablet 4 mg (not administered)    Or  ondansetron (ZOFRAN) injection 4 mg (not administered)  zolpidem (AMBIEN) tablet 5 mg (not administered)  phenytoin (DILANTIN) ER capsule 100 mg (not administered)  vancomycin (VANCOCIN) IVPB 1000 mg/200 mL premix (not administered)  vancomycin (VANCOCIN) 500 mg in sodium chloride 0.9 % 100 mL IVPB (not administered)  ceFEPIme (MAXIPIME) 2 g in dextrose 5 % 50 mL IVPB (not  administered)  sodium chloride 0.9 % bolus 1,500 mL (1,500 mLs Intravenous New Bag/Given 11/13/16 2256)  lidocaine (PF) (XYLOCAINE) 1 % injection 30 mL (30 mLs Intradermal Given 11/13/16 2305)     Initial Impression / Assessment and Plan / ED Course  I have reviewed the triage vital signs and the nursing notes.  Pertinent labs & imaging results that were available during my care of the patient were reviewed by me and considered in my medical decision making (see chart for details).     Patient is an 83 year old female with past medical history significant for dementia, who presents to the emergency department with fever and altered mental status. 1 day of symptoms. Temperature 101.5. Hemodynamically stable. Nonfocal exam.  Blood cultures obtained. Lab work remarkable for leukocytosis of 11.3. No significant electrolyte abnormalities. Chest x-ray showed no signs of pneumonia. UA showed no signs of UTI. Initial lactic acid 0.8. Patient with a benign abdominal exam, doubt intra-abdominal source. Patient given antipyretics. Thyroid studies within normal limits.   Unknown source of the patient's fever. CT head showed no acute findings. Attempted lumbar puncture to evaluate for meningitis but was unsuccessful. Willl treat empirically with broad-spectrum antibiotics for unknown source.   Patient admitted to hospitalist, Dr. Blaine Hamper, for further management. Patient stable for the floor. Patient's son at bedside and updated on patient's care. Patient has had improvement of her mental status in the emergency department.  Final Clinical Impressions(s) / ED Diagnoses   Final diagnoses:  Fever, unspecified fever cause  Altered mental status, unspecified altered mental status type    New Prescriptions New Prescriptions   No medications on file     Nathaniel Man, MD 11/14/16 Jen Mow    Davonna Belling, MD 11/14/16 [phone]

## 2016-11-13 NOTE — H&P (Signed)
History and Physical    Rebecca [name]:[id number] DOB: 04-22-1928 DOA: 11/13/2016  Referring MD/NP/PA:   PCP: [name], MD   Patient coming from:  The patient is coming from SNF.  At baseline, pt is dependent for most of ADL.   Chief Complaint: fever and AMS  HPI: Rebecca [name] is a 83 y.o. 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  Per patient's son, patient becomes confused today. She has dementia, but mental status is worse than her baseline. Patient does not have unilateral weakness, numbness or tingling sensations in extremities. No vision change or hearing loss. No slurred speech or facial droop. Patient has fever and chills. No chest pain, SOB, nausea, vomiting, cough, abdominal pain, diarrhea, symptoms of UTI. No neck pain or photophobia.  ED Course: pt was found to have WBC 11.3, lactic acid 0.99, negative urinalysis, temperature 101.3, no tachycardia, section 90-95% on room air. Chest x-ray showed chronic interstitial changes, without infiltration. CT head is negative for acute intracranial abnormalities.  Review of Systems:   General: has fevers, chills, no changes in body weight, has fatigue HEENT: no blurry vision, hearing changes or sore throat Respiratory: no dyspnea, coughing, wheezing CV: no chest pain, no palpitations GI: no nausea, vomiting, abdominal pain, diarrhea, constipation GU: no dysuria, burning on urination, increased urinary frequency, hematuria  Ext: no leg edema Neuro: no unilateral weakness, numbness, or tingling, no vision change or hearing loss. Has confusion. Skin: no rash, no skin tear. MSK: No muscle spasm, no deformity, no limitation of range of movement in spin Heme: No easy bruising.  Travel history: No recent long distant  travel.  Allergy:  Allergies  Allergen Reactions  . Macrodantin [Nitrofurantoin]   . Morphine And Related Other (See Comments)    Altered mental state   . Sulfa Antibiotics Other (See Comments)    Reaction: unknown    Past Medical History:  Diagnosis Date  . Anemia, unspecified 03/18/2011  . Anxiety state, unspecified 01/2011  . Blood in stool 06/15/2012  . Corns and callosities 07/01/2011  . Diverticulosis 02/2011  . Dizziness   . DJD (degenerative joint disease)   . Dysuria 06/17/2011  . Hemorrhoids 02/2011  . Low back pain   . Mitral valve problem thickened   thickened  . Osteoarthritis of both hands 10/09/2015  . Osteoporosis 02/2011  . Other abnormal blood chemistry 0/30/2012  . Other acquired deformity of toe 12/16/2011  . Pain in joint, site unspecified 01/2012  . 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 Sacroiliitis, not elsewhere classified ([place]) 05/2011  . Seizures ([place])    Remotely  . Unspecified hypothyroidism 01/2011  . 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 Vitamin D deficiency 01/2011    Past Surgical History:  Procedure Laterality Date  . bletheroplasty    . CATARACT EXTRACTION W/ INTRAOCULAR LENS  IMPLANT, BILATERAL  2010  . CERVICAL LAMINECTOMY  2005  . HAMMER TOE SURGERY  2013   right 2nd toe Dr. Mallie Mussel  . JOINT REPLACEMENT Bilateral 2002 Right, 1992 Left   knees    Social History:  reports that she quit smoking about 28 years ago. She has never used smokeless tobacco. She reports that she drinks about 0.6 oz of alcohol per week . She reports that she does not use  drugs.  Family History:  Family History  Problem Relation Age of Onset  . Alzheimer's disease Sister   . Emphysema Sister      Prior to Admission medications   Medication Sig Start Date End Date Taking? Authorizing Provider  acetaminophen (TYLENOL) 325 MG tablet Take 650 mg by mouth every 4 (four) hours as needed. Take 2 tablets twice  daily     [provider]  amoxicillin (AMOXIL) 500 MG capsule Take 2,000 mg by mouth as directed. 1 Hour Before Procedure.    [provider]  Calcium Carbonate-Vitamin D (CALCIUM 600+D) 600-400 MG-UNIT tablet Take 1 tablet by mouth daily.    [provider]  Cholecalciferol (VITAMIN D3) 5000 units TABS Take 1 tablet by mouth daily. Take one tablet daily     [provider]  clonazePAM (KLONOPIN) 0.5 MG tablet Take one tablet by mouth 2 times a day at 8 am and 8 pm. 06/03/16   Lauree Chandler, NP  dextromethorphan-guaiFENesin (ROBAFEN DM [facility]/CHEST CONGEST) 10-100 MG/5ML liquid Take by mouth.    [provider]  donepezil (ARICEPT) 10 MG tablet Take 10 mg by mouth daily.    [provider]  DULoxetine (CYMBALTA) 30 MG capsule Take 30 mg by mouth. Take one tablet daily    [provider]  fluticasone furoate-vilanterol (BREO ELLIPTA) 100-25 MCG/INH AEPB Inhale 1 puff into the lungs daily.    [provider]  guaiFENesin (MUCINEX) 600 MG 12 hr tablet Take 600 mg by mouth at bedtime.     [provider]  levothyroxine (SYNTHROID, LEVOTHROID) 100 MCG tablet Take 1 tablet (100 mcg total) by mouth daily before breakfast. For thyroid 09/28/14   Estill Dooms, MD  meloxicam (MOBIC) 7.5 MG tablet Take 7.5 mg by mouth daily as needed.     [provider]  memantine (NAMENDA) 10 MG tablet Take 10 mg by mouth 2 (two) times daily.    [provider]  Multiple Vitamins-Minerals (THERA M PLUS PO) Take by mouth. Take 9 mg once daily    [provider]  Omega-3 1000 MG CAPS Take 1 capsule by mouth daily. Take one capsule every morning     [provider]  omeprazole (PRILOSEC) 20 MG capsule Take 20 mg by mouth at bedtime.    [provider]  phenytoin (DILANTIN) 100 MG ER capsule Take 100-200 mg by mouth 2 (two) times daily. Take one capsule every morning; take 2 capsules at bedtime      [provider]  triamcinolone cream (KENALOG) 0.1 % Apply 1 application topically daily as needed.  03/06/16   [provider]  trolamine salicylate (ASPERCREME) 10 % cream Apply 1 application topically. Apply as needed    [provider]    Physical Exam: Vitals:   11/13/16 2310 11/13/16 2330 11/14/16 0015 11/14/16 0050  BP:  (!) 108/57 101/67 (!) 103/55  Pulse:  76 72 64  Resp:  16 (!) 25 19  Temp: (!) 101.3 F (38.5 C)   98.1 F (36.7 C)  TempSrc:      SpO2:  98% 100% 99%  Weight:      Height:       General: Not in acute distress HEENT:       Eyes: PERRL, EOMI, no scleral icterus.       ENT: No discharge from the ears and nose, no pharynx injection, no tonsillar enlargement.        Neck: No JVD, no  bruit, no mass felt. Heme: No neck lymph node enlargement. Cardiac: S1/S2, RRR, No murmurs, No gallops or rubs. Respiratory: No rales, wheezing, rhonchi or rubs. GI: Soft, nondistended, nontender, no rebound pain, no organomegaly, BS present. GU: No hematuria Ext: No pitting leg edema bilaterally. 2+DP/PT pulse bilaterally. Musculoskeletal: No joint deformities, No joint redness or warmth, no limitation of ROM in spin. Skin: No rashes.  Neuro: confused, but knows that she is in hospital. Recognize her son, but not oriented to time. Cranial nerves II-XII grossly intact, moves all extremities normally. Psych: Patient is not psychotic, no suicidal or hemocidal ideation.  Labs on Admission: I have personally reviewed following labs and imaging studies  CBC:  Recent Labs Lab 11/13/16 1917  WBC 11.3*  NEUTROABS 9.5*  HGB 12.7  HCT 39.0  MCV 97.7  PLT 277*   Basic Metabolic Panel:  Recent Labs Lab 11/13/16 1917  NA 136  K 4.0  CL 106  CO2 25  GLUCOSE 123*  BUN 18  CREATININE 0.75  CALCIUM 8.2*   GFR: Estimated Creatinine Clearance: 42.2 mL/min (by C-G formula based on SCr of 0.75 mg/dL). Liver Function Tests:  Recent Labs Lab  11/13/16 1917  AST 24  ALT 17  ALKPHOS 74  BILITOT 0.3  PROT 5.9*  ALBUMIN 3.0*   No results for input(s): LIPASE, AMYLASE in the last 168 hours. No results for input(s): AMMONIA in the last 168 hours. Coagulation Profile:  Recent Labs Lab 11/13/16 2229  INR 1.10   Cardiac Enzymes: No results for input(s): CKTOTAL, CKMB, CKMBINDEX, TROPONINI in the last 168 hours. BNP (last 3 results) No results for input(s): PROBNP in the last 8760 hours. HbA1C: No results for input(s): HGBA1C in the last 72 hours. CBG: No results for input(s): GLUCAP in the last 168 hours. Lipid Profile: No results for input(s): CHOL, HDL, LDLCALC, TRIG, CHOLHDL, LDLDIRECT in the last 72 hours. Thyroid Function Tests:  Recent Labs  11/13/16 1917  TSH 0.533   Anemia Panel: No results for input(s): VITAMINB12, FOLATE, FERRITIN, TIBC, IRON, RETICCTPCT in the last 72 hours. Urine analysis:    Component Value Date/Time   COLORURINE YELLOW 11/13/2016 1946   APPEARANCEUR CLEAR 11/13/2016 1946   LABSPEC 1.018 11/13/2016 1946   PHURINE 7.0 11/13/2016 1946   GLUCOSEU NEGATIVE 11/13/2016 1946   HGBUR LARGE (A) 11/13/2016 1946   BILIRUBINUR NEGATIVE 11/13/2016 1946   BILIRUBINUR n 08/08/2013 [place] 11/13/2016 1946   PROTEINUR 30 (A) 11/13/2016 1946   UROBILINOGEN 0.2 03/11/2015 1926   NITRITE NEGATIVE 11/13/2016 1946   LEUKOCYTESUR NEGATIVE 11/13/2016 1946   Sepsis Labs: @LABRCNTIP (procalcitonin:4,lacticidven:4) )No results found for this or any previous visit (from the past 240 hour(s)).   Radiological Exams on Admission: Dg Chest 2 View  Result Date: 11/13/2016 CLINICAL DATA:  Fever and decreased mental status EXAM: CHEST  2 VIEW COMPARISON:  04/02/2015.  03/28/2015. FINDINGS: AP and lateral views of the chest show enlargement of the cardiopericardial silhouette. There is diffuse underlying chronic interstitial lung disease, as before. No definite superimposed pulmonary edema or  focal pneumonia. No substantial pleural effusion. The visualized bony structures of the thorax are intact. Telemetry leads overlie the chest. IMPRESSION: Chronic underlying interstitial lung disease, similar to prior studies. No definite superimposed focal pneumonia or pulmonary edema at this time. Electronically Signed   By: Misty Stanley M.D.   On: 11/13/2016 20:09   Ct Head Wo Contrast  Result Date: 11/13/2016 CLINICAL DATA:  83 year old female with altered  mental status and fever. EXAM: CT HEAD WITHOUT CONTRAST TECHNIQUE: Contiguous axial images were obtained from the base of the skull through the vertex without intravenous contrast. COMPARISON:  03/30/2016 and prior exams FINDINGS: Brain: No evidence of acute infarction, hemorrhage, hydrocephalus, extra-axial collection or mass lesion/mass effect. Atrophy, chronic small-vessel white matter ischemic changes and remote left occipital infarct again noted. Vascular: Intracranial atherosclerotic calcifications noted. Skull: Normal. Negative for fracture or focal lesion. Sinuses/Orbits: No acute finding. Other: None. IMPRESSION: No at evidence of acute intracranial abnormality. Atrophy, chronic small-vessel white matter ischemic changes and remote left occipital infarct. Electronically Signed   By: Margarette Canada M.D.   On: 11/13/2016 22:47     EKG: Independently reviewed. Sinus rhythm, low voltage, LAD, anteroseptal infarction pattern.  Assessment/Plan Principal Problem:   Sepsis (House) Active Problems:   Hypothyroidism   Dementia   Seizure disorder ([place])   Chronic pulmonary embolism (HCC)   GERD (gastroesophageal reflux disease)   Acute metabolic encephalopathy   Sepsis [facility]): Patient is septic with leukocytosis, fever, and tachypnea. Lactic acid is normal. Source of infection is not identified. No respiratory symptoms. Urinalysis negative. No GI symptoms. Since patient has altered mental status, meningitis is potential differential diagnosis. It  is physician attempted LP without success. Currently hemodynamically stable.  -will admit to tele bed as inpt with droplet isolation -IV Dexamethasone (DECADRON) 10 mg x 1 -will start IV Vancomycin, cefepime and acyclovir - add IV ampicillin given he is over 41 year old to cover Listeria -will get Procalcitonin and trend lactic acid level per sepsis protocol -IVF: received 1.5 L of NS bolus in ED, followed by 75 cc/h -blood culture and urine culture -Frequent neuro check  Hypothyroidism: Last TSH was 0.533 -Continue home Synthroid  Dementia:  -Donepezil and Namenda  Seizure disorder -Seizure precaution -Continue phenytoin and check phenytoinlevel  GERD: -Protonix  Acute metabolic encephalopathy: due to sepsis and possible meningitis -Frequent neuro check -Antibiotics as above   Depression and anxiety: Stable, no suicidal or homicidal ideations. -Continue home medications: Seroquel, Cymbalta, when necessary Klonopin  DVT ppx: SCD Code Status: DNR (I discussed with patient's son and explained the meaning of CODE STATUS. Per her son, patient would want to be DNR) Family Communication:  Yes, patient's  son  at bed side Disposition Plan:  Anticipate discharge back to previous SNF  Consults called:  none Admission status:  Inpatient/tele      Date of Service 11/14/2016    Ivor Costa [facility] Pager [phone]  If 7PM-7AM, please contact night-coverage [email or web address] Password [facility] 11/14/2016, 3:42 AM

## 2016-11-13 NOTE — Progress Notes (Signed)
Pharmacy Antibiotic Note  Rebecca [name] is a 83 y.o. female admitted on 11/13/2016 with sepsis.  Pharmacy has been consulted for vancomycin and cefepime dosing.  Plan: Vancomycin 1g IV x1, then 500mg  IV q12h. Goal trough 15-76mcg/mL Cefepime 2g IV q24h Follow c/s, clinical progression, renal function, level at steady state  Height: 5' (152.4 cm) Weight: 141 lb (64 kg) IBW/kg (Calculated) : 45.5  Temp (24hrs), Avg:101.3 F (38.5 C), Min:101.3 F (38.5 C), Max:101.3 F (38.5 C)   Recent Labs Lab 11/13/16 1917 11/13/16 1924 11/13/16 2239  WBC 11.3*  --   --   CREATININE 0.75  --   --   LATICACIDVEN  --  0.88 0.57    Estimated Creatinine Clearance: 42.2 mL/min (by C-G formula based on SCr of 0.75 mg/dL).    Allergies  Allergen Reactions  . Macrodantin [Nitrofurantoin]   . Morphine And Related Other (See Comments)    Altered mental state   . Sulfa Antibiotics Other (See Comments)    Reaction: unknown    Antimicrobials this admission: Vancomycin 6/28 >>  Cefepime 6/28 >>   Dose adjustments this admission: n/a  Microbiology results: 6/28 BCx:  6/28 UCx:  6/28 CSF:     Thank you for allowing pharmacy to be a part of this patient's care. Myrene Bougher D. [place], PharmD, [place] Clinical Pharmacist [phone] 11/13/2016 10:57 PM

## 2016-11-13 NOTE — ED Triage Notes (Signed)
[facility] reports that the pt's facility called 9-1-1 for a fever and increased altered mental status. EMS reports that the pt has alzheimers and there is a change from normal. EMS reports the pt is normaly "walky talky" and today she has not been. EMS reports facility staff unable to advise how long the pt has had these problems.   Pt states she does not feel bad and does not understand why she is here.

## 2016-11-14 ENCOUNTER — Inpatient Hospital Stay (HOSPITAL_COMMUNITY): Payer: Medicare Other

## 2016-11-14 ENCOUNTER — Other Ambulatory Visit: Payer: Self-pay | Admitting: Diagnostic Radiology

## 2016-11-14 DIAGNOSIS — R509 Fever, unspecified: Secondary | ICD-10-CM

## 2016-11-14 LAB — CSF CELL COUNT WITH DIFFERENTIAL
RBC COUNT CSF: 0 /mm3
TUBE #: 3
WBC, CSF: 1 /mm3 (ref 0–5)

## 2016-11-14 LAB — MRSA PCR SCREENING: MRSA BY PCR: NEGATIVE

## 2016-11-14 LAB — RESPIRATORY PANEL BY PCR
ADENOVIRUS-RVPPCR: NOT DETECTED
Bordetella pertussis: NOT DETECTED
CHLAMYDOPHILA PNEUMONIAE-RVPPCR: NOT DETECTED
CORONAVIRUS HKU1-RVPPCR: NOT DETECTED
CORONAVIRUS NL63-RVPPCR: NOT DETECTED
Coronavirus 229E: NOT DETECTED
Coronavirus OC43: NOT DETECTED
Influenza A: NOT DETECTED
Influenza B: NOT DETECTED
METAPNEUMOVIRUS-RVPPCR: NOT DETECTED
Mycoplasma pneumoniae: NOT DETECTED
PARAINFLUENZA VIRUS 1-RVPPCR: NOT DETECTED
PARAINFLUENZA VIRUS 2-RVPPCR: NOT DETECTED
PARAINFLUENZA VIRUS 3-RVPPCR: NOT DETECTED
Parainfluenza Virus 4: NOT DETECTED
RHINOVIRUS / ENTEROVIRUS - RVPPCR: NOT DETECTED
Respiratory Syncytial Virus: NOT DETECTED

## 2016-11-14 LAB — GLUCOSE, CAPILLARY: Glucose-Capillary: 137 mg/dL — ABNORMAL HIGH (ref 65–99)

## 2016-11-14 LAB — PROTEIN, CSF: TOTAL PROTEIN, CSF: 35 mg/dL (ref 15–45)

## 2016-11-14 LAB — LACTIC ACID, PLASMA
Lactic Acid, Venous: 0.8 mmol/L (ref 0.5–1.9)
Lactic Acid, Venous: 0.9 mmol/L (ref 0.5–1.9)

## 2016-11-14 LAB — GLUCOSE, CSF: Glucose, CSF: 74 mg/dL — ABNORMAL HIGH (ref 40–70)

## 2016-11-14 MED ORDER — DEXTROSE 5 % IV SOLN
500.0000 mg | Freq: Two times a day (BID) | INTRAVENOUS | Status: DC
  Administered 2016-11-14 – 2016-11-16 (×4): 500 mg via INTRAVENOUS
  Filled 2016-11-14 (×7): qty 10

## 2016-11-14 MED ORDER — QUETIAPINE FUMARATE 25 MG PO TABS
12.5000 mg | ORAL_TABLET | ORAL | Status: DC
  Administered 2016-11-14 – 2016-11-17 (×4): 12.5 mg via ORAL
  Filled 2016-11-14 (×4): qty 1

## 2016-11-14 MED ORDER — DEXAMETHASONE 4 MG PO TABS
10.0000 mg | ORAL_TABLET | Freq: Once | ORAL | Status: AC
  Administered 2016-11-14: 10 mg via ORAL
  Filled 2016-11-14: qty 3

## 2016-11-14 MED ORDER — LIDOCAINE HCL (PF) 1 % IJ SOLN
10.0000 mL | Freq: Once | INTRAMUSCULAR | Status: DC
  Filled 2016-11-14: qty 30

## 2016-11-14 MED ORDER — SODIUM CHLORIDE 0.9 % IV SOLN
2.0000 g | Freq: Three times a day (TID) | INTRAVENOUS | Status: DC
  Administered 2016-11-14: 2 g via INTRAVENOUS
  Filled 2016-11-14 (×2): qty 2000

## 2016-11-14 MED ORDER — OMEGA-3-ACID ETHYL ESTERS 1 G PO CAPS
1.0000 g | ORAL_CAPSULE | Freq: Every day | ORAL | Status: DC
  Administered 2016-11-14 – 2016-11-17 (×4): 1 g via ORAL
  Filled 2016-11-14 (×4): qty 1

## 2016-11-14 MED ORDER — LORAZEPAM 2 MG/ML IJ SOLN
0.5000 mg | Freq: Once | INTRAMUSCULAR | Status: AC
  Administered 2016-11-14: 0.5 mg via INTRAVENOUS
  Filled 2016-11-14: qty 1

## 2016-11-14 MED ORDER — LIDOCAINE HCL 1 % IJ SOLN
INTRAMUSCULAR | Status: AC
  Filled 2016-11-14: qty 10

## 2016-11-14 NOTE — Procedures (Signed)
Electroencephalogram report  Date-11/07/2016  Referring physician- Bonnielee Haff  Brief history- 83 year old Caucasian female with a past medical history of hypothyroidism, depression, anxiety, seizure disorder, chronic back pain, dementia who lives in skilled nursing facility, presented with worsening confusion and fever. CT head did not show any acute findings. Lumbar puncture did not suggest meningitis.  Meds 0.9 % sodium chloride infusion    acetaminophen (TYLENOL) tablet 650 mg      calcium-vitamin D (OSCAL WITH D) 500-200 MG-UNIT per tablet 2 tablet   ceFEPIme (MAXIPIME) 2 g in dextrose 5 % 50 mL IVPB   cholecalciferol (VITAMIN D) tablet 5,000 Units   clonazePAM (KLONOPIN) tablet 0.5 mg   donepezil (ARICEPT) tablet 10 mg   DULoxetine (CYMBALTA) DR capsule 30 mg   fluticasone furoate-vilanterol (BREO ELLIPTA) 100-25 MCG/INH 1 puff   guaiFENesin (MUCINEX) 12 hr tablet 600 mg   levothyroxine (SYNTHROID, LEVOTHROID) tablet 100 mcg   lidocaine (PF) (XYLOCAINE) 1 % injection 10 mL   lidocaine (XYLOCAINE) 1 % (with pres) injection   memantine (NAMENDA) tablet 10 mg   multivitamin with minerals tablet 1 tablet   MUSCLE RUB CREA 1 application   omega-3 acid ethyl esters (LOVAZA) capsule 1 g  L1 ondansetron (ZOFRAN) injection 4 mg  L1 ondansetron (ZOFRAN) tablet 4 mg   pantoprazole (PROTONIX) EC tablet 40 mg   phenytoin (DILANTIN) ER capsule 100 mg   phenytoin (DILANTIN) ER capsule 200 mg   QUEtiapine (SEROQUEL) tablet 12.5 mg   sodium chloride flush (NS) 0.9 % injection 3 mL   vancomycin (VANCOCIN) 500 mg in sodium chloride 0.9 % 100 mL IVPB   vancomycin (VANCOCIN) IVPB 1000 mg/200 mL premix  Technical This is a multichannel digital EEG recording using the international 10-20 placement system  Description of the recording When awake posterior down and rhythm is 6-7 Hz symmetrical reactive and well sustained. Sleep architecture was not seen. Epileptiform features were not seen  during this recording  Impression The EEG is abnormal with patient recorded in awake and drowsy state only and findings are consistent with Mild generalized cerebral dysfunction due to background slowing. Epileptiform features were not seen during this recording.

## 2016-11-14 NOTE — Progress Notes (Signed)
Routine EEG completed, results pending. 

## 2016-11-14 NOTE — Progress Notes (Signed)
Pharmacy Anti-infective Note  Rebecca [name] is a 83 y.o. female admitted on 11/13/2016 with sepsis and AMS, meningitis cannot be ruled out.  Pharmacy has been consulted to add acyclovir to current vanc and cefepime.  Plan: Acyclovir 500mg  IV Q12H.  Height: 5' (152.4 cm) Weight: 141 lb (64 kg) IBW/kg (Calculated) : 45.5  Temp (24hrs), Avg:100.2 F (37.9 C), Min:98.1 F (36.7 C), Max:101.3 F (38.5 C)   Recent Labs Lab 11/13/16 1917 11/13/16 1924 11/13/16 2239 11/14/16 0005 11/14/16 0110  WBC 11.3*  --   --   --   --   CREATININE 0.75  --   --   --   --   LATICACIDVEN  --  0.88 0.57 0.9 0.8    Estimated Creatinine Clearance: 42.2 mL/min (by C-G formula based on SCr of 0.75 mg/dL).    Allergies  Allergen Reactions  . Macrodantin [Nitrofurantoin]   . Morphine And Related Other (See Comments)    Altered mental state   . Sulfa Antibiotics Other (See Comments)    Reaction: unknown     Thank you for allowing pharmacy to be a part of this patient's care.  Wynona Neat, PharmD, BCPS  11/14/2016 2:08 AM

## 2016-11-14 NOTE — Progress Notes (Signed)
[facility] PROGRESS NOTE  Rebecca [name] TSV:[id number] DOB: May 11, 1928 DOA: 11/13/2016  PCP: [name], MD  Brief History/Interval Summary: 83 year old Caucasian female with a past medical history of hypothyroidism, depression, anxiety, seizure disorder, chronic back pain, dementia who lives in skilled nursing facility, presented with worsening confusion and fever. Patient was hospitalized for further management.  Reason for Visit: Altered mental status. Fever.  Consultants: None  Procedures: Lumbar puncture  Antibiotics: Initially started on vancomycin, cefepime, ampicillin and acyclovir. Ampicillin discontinued.  Subjective/Interval History: Patient remains confused. Was unable to tell me the year or month. Unable to answer questions appropriately. She would start singing.  ROS: Unable to do  Objective:  Vital Signs  Vitals:   11/14/16 0015 11/14/16 0050 11/14/16 0509 11/14/16 0841  BP: 101/67 (!) 103/55 (!) 105/48   Pulse: 72 64 65   Resp: (!) 25 19 18    Temp:  98.1 F (36.7 C) 98.3 F (36.8 C)   TempSrc:      SpO2: 100% 99% 100% 98%  Weight:      Height:        Intake/Output Summary (Last 24 hours) at 11/14/16 1216 Last data filed at 11/14/16 0630  Gross per 24 hour  Intake           563.75 ml  Output                0 ml  Net           563.75 ml   Filed Weights   11/13/16 1852  Weight: 64 kg (141 lb)    General appearance: alert, distracted and no distress Throat: lips, mucosa, and tongue normal; teeth and gums normal Neck: no adenopathy, no carotid bruit, no JVD, supple, symmetrical, trachea midline, thyroid not enlarged, symmetric, no tenderness/mass/nodules and Soft and supple Resp: Crackles noted bilateral bases. No wheezing. Cardio: regular rate and rhythm, S1, S2 normal, no murmur, click, rub or gallop GI: soft, non-tender; bowel sounds normal; no masses,  no organomegaly Extremities: extremities normal, atraumatic, no cyanosis or  edema Neurologic: Awake, alert. Distracted. Disoriented. No facial asymmetry. Moving her upper and lower extremities without any difficulty. No obvious focal deficits noted.  Lab Results:  Data Reviewed: I have personally reviewed following labs and imaging studies  CBC:  Recent Labs Lab 11/13/16 1917  WBC 11.3*  NEUTROABS 9.5*  HGB 12.7  HCT 39.0  MCV 97.7  PLT 136*    Basic Metabolic Panel:  Recent Labs Lab 11/13/16 1917  NA 136  K 4.0  CL 106  CO2 25  GLUCOSE 123*  BUN 18  CREATININE 0.75  CALCIUM 8.2*    GFR: Estimated Creatinine Clearance: 42.2 mL/min (by C-G formula based on SCr of 0.75 mg/dL).  Liver Function Tests:  Recent Labs Lab 11/13/16 1917  AST 24  ALT 17  ALKPHOS 74  BILITOT 0.3  PROT 5.9*  ALBUMIN 3.0*     Coagulation Profile:  Recent Labs Lab 11/13/16 2229  INR 1.10    CBG:  Recent Labs Lab 11/14/16 0745  GLUCAP 137*    Thyroid Function Tests:  Recent Labs  11/13/16 1917  TSH 0.533     Recent Results (from the past 240 hour(s))  CSF culture     Status: None (Preliminary result)   Collection Time: 11/14/16  9:50 AM  Result Value Ref Range Status   Specimen Description CSF  Final   Special Requests NONE  Final   Gram Stain   Final  CYTOSPIN SMEAR WBC PRESENT, PREDOMINANTLY MONONUCLEAR NO ORGANISMS SEEN    Culture PENDING  Incomplete   Report Status PENDING  Incomplete      Radiology Studies: Dg Chest 2 View  Result Date: 11/13/2016 CLINICAL DATA:  Fever and decreased mental status EXAM: CHEST  2 VIEW COMPARISON:  04/02/2015.  03/28/2015. FINDINGS: AP and lateral views of the chest show enlargement of the cardiopericardial silhouette. There is diffuse underlying chronic interstitial lung disease, as before. No definite superimposed pulmonary edema or focal pneumonia. No substantial pleural effusion. The visualized bony structures of the thorax are intact. Telemetry leads overlie the chest. IMPRESSION:  Chronic underlying interstitial lung disease, similar to prior studies. No definite superimposed focal pneumonia or pulmonary edema at this time. Electronically Signed   By: Misty Stanley M.D.   On: 11/13/2016 20:09   Ct Head Wo Contrast  Result Date: 11/13/2016 CLINICAL DATA:  83 year old female with altered mental status and fever. EXAM: CT HEAD WITHOUT CONTRAST TECHNIQUE: Contiguous axial images were obtained from the base of the skull through the vertex without intravenous contrast. COMPARISON:  03/30/2016 and prior exams FINDINGS: Brain: No evidence of acute infarction, hemorrhage, hydrocephalus, extra-axial collection or mass lesion/mass effect. Atrophy, chronic small-vessel white matter ischemic changes and remote left occipital infarct again noted. Vascular: Intracranial atherosclerotic calcifications noted. Skull: Normal. Negative for fracture or focal lesion. Sinuses/Orbits: No acute finding. Other: None. IMPRESSION: No at evidence of acute intracranial abnormality. Atrophy, chronic small-vessel white matter ischemic changes and remote left occipital infarct. Electronically Signed   By: Margarette Canada M.D.   On: 11/13/2016 22:47   Dg Fluoro Guide Lumbar Puncture  Result Date: 11/14/2016 CLINICAL DATA:  Fever of unknown origin.  Altered mental status. EXAM: DIAGNOSTIC LUMBAR PUNCTURE UNDER FLUOROSCOPIC GUIDANCE FLUOROSCOPY TIME:  Fluoroscopy Time:  1 minutes PROCEDURE: Informed consent was obtained from the patient's son by phone prior to the procedure, including potential complications of headache, allergy, and pain. With the patient prone, the lower back was prepped with Betadine. 1% Lidocaine was used for local anesthesia. Lumbar puncture was performed at the L3-4 level using a 21 gauge needle with return of clear colorless CSF. 20 ml of CSF were obtained for laboratory studies. The patient tolerated the procedure well and there were no apparent complications. IMPRESSION: Lumbar puncture performed  without complication. Laboratory results to follow. Electronically Signed   By: Lorriane Shire M.D.   On: 11/14/2016 10:08     Medications:  Scheduled: . calcium-vitamin D  2 tablet Oral Daily  . cholecalciferol  5,000 Units Oral Daily  . clonazePAM  0.5 mg Oral BID  . donepezil  10 mg Oral Daily  . DULoxetine  30 mg Oral Daily  . fluticasone furoate-vilanterol  1 puff Inhalation Daily  . guaiFENesin  600 mg Oral QHS  . levothyroxine  100 mcg Oral QAC breakfast  . lidocaine (PF)  10 mL Intradermal Once  . lidocaine      . memantine  10 mg Oral BID  . multivitamin with minerals  1 tablet Oral Daily  . omega-3 acid ethyl esters  1 g Oral Daily  . pantoprazole  40 mg Oral Daily  . phenytoin  100 mg Oral Daily  . phenytoin  200 mg Oral QHS  . QUEtiapine  12.5 mg Oral Q24H  . sodium chloride flush  3 mL Intravenous Q12H   Continuous: . sodium chloride 75 mL/hr at 11/14/16 0102  . acyclovir Stopped (11/14/16 0730)  . ceFEPime (MAXIPIME) IV    .  vancomycin 500 mg (11/14/16 0505)  . vancomycin     DBZ:[id number], MUSCLE RUB, ondansetron **OR** ondansetron (ZOFRAN) IV, zolpidem  Assessment/Plan:  Principal Problem:   Sepsis ([place]) Active Problems:   Hypothyroidism   Dementia   Seizure disorder ([place])   Chronic pulmonary embolism (HCC)   GERD (gastroesophageal reflux disease)   Acute metabolic encephalopathy    Sepsis of unknown etiology Patient presented with leukocytosis, fever, or tachypnea. Lactic acid normal. Pro-calcitonin level less than 0.1. UA did not suggest any infection. Chest x-ray showed chronic interstitial changes. Due to her altered mental status, concern was for meningitis. Patient underwent lumbar puncture by interventional radiology. LP was initially attempted by the emergency department provider but was not successful. WBC cell count is only 1. Gram stain does not show any organism. This does not appear to be meningitis. Patient has been noted to have a  cough. Chest x-ray shows chronic changes. We will repeat x-ray tomorrow after she has been adequately hydrated. Continue vancomycin and cefepime. Continue acyclovir until HSV PCR returns. Check acute respiratory virus panel. Continue droplet precautions for now. Follow up on blood cultures.  Acute encephalopathy. This is in the setting of known dementia. CT head did not show any acute findings. Lumbar puncture did not suggest meningitis. Altered mental status could be due to fever and infection. No obvious focal neurological deficits. Continue to monitor for now. If there is no improvement, will consider MRI brain. Since she does have a history of seizure disorder we will proceed with EEG.  History of dementia. Continue home medication.  History of seizure disorder. Continue Dilantin. Dilantin level was 7.9. EEG as mentioned above.  History of hypothyroidism. TSH normal. Continue home medications.  History of depression and anxiety. Stable. Continue home medications.  DVT Prophylaxis: SCDs    Code Status: DO NOT RESUSCITATE  Family Communication: Discussed with patient's son  Disposition Plan: Management as outlined above.    LOS: 1 day   [facility] Pager [phone] 11/14/2016, 12:16 PM  If 7PM-7AM, please contact night-coverage at [email or web address], password [facility]

## 2016-11-15 ENCOUNTER — Inpatient Hospital Stay (HOSPITAL_COMMUNITY): Payer: Medicare Other

## 2016-11-15 DIAGNOSIS — G9340 Encephalopathy, unspecified: Secondary | ICD-10-CM

## 2016-11-15 LAB — CBC
HEMATOCRIT: 35.8 % — AB (ref 36.0–46.0)
Hemoglobin: 11.3 g/dL — ABNORMAL LOW (ref 12.0–15.0)
MCH: 31.1 pg (ref 26.0–34.0)
MCHC: 31.6 g/dL (ref 30.0–36.0)
MCV: 98.6 fL (ref 78.0–100.0)
Platelets: 127 10*3/uL — ABNORMAL LOW (ref 150–400)
RBC: 3.63 MIL/uL — ABNORMAL LOW (ref 3.87–5.11)
RDW: 13.2 % (ref 11.5–15.5)
WBC: 9.3 10*3/uL (ref 4.0–10.5)

## 2016-11-15 LAB — HERPES SIMPLEX VIRUS(HSV) DNA BY PCR
HSV 1 DNA: NEGATIVE
HSV 2 DNA: NEGATIVE

## 2016-11-15 LAB — COMPREHENSIVE METABOLIC PANEL
ALBUMIN: 2.7 g/dL — AB (ref 3.5–5.0)
ALT: 17 U/L (ref 14–54)
ANION GAP: 5 (ref 5–15)
AST: 20 U/L (ref 15–41)
Alkaline Phosphatase: 54 U/L (ref 38–126)
BILIRUBIN TOTAL: 0.7 mg/dL (ref 0.3–1.2)
BUN: 9 mg/dL (ref 6–20)
CO2: 26 mmol/L (ref 22–32)
Calcium: 8.4 mg/dL — ABNORMAL LOW (ref 8.9–10.3)
Chloride: 111 mmol/L (ref 101–111)
Creatinine, Ser: 0.6 mg/dL (ref 0.44–1.00)
GFR calc Af Amer: >60 mL/min (ref >60)
GFR calc non Af Amer: >60 mL/min (ref >60)
GLUCOSE: 93 mg/dL (ref 65–99)
POTASSIUM: 3.5 mmol/L (ref 3.5–5.1)
SODIUM: 142 mmol/L (ref 135–145)
TOTAL PROTEIN: 5.4 g/dL — AB (ref 6.5–8.1)

## 2016-11-15 LAB — T3, FREE: T3, Free: 2.2 pg/mL (ref 2.0–4.4)

## 2016-11-15 LAB — GLUCOSE, CAPILLARY: Glucose-Capillary: 83 mg/dL (ref 65–99)

## 2016-11-15 MED ORDER — LORAZEPAM 2 MG/ML IJ SOLN
0.5000 mg | Freq: Once | INTRAMUSCULAR | Status: AC
  Administered 2016-11-15: 0.5 mg via INTRAVENOUS
  Filled 2016-11-15 (×2): qty 1

## 2016-11-15 MED ORDER — HALOPERIDOL LACTATE 5 MG/ML IJ SOLN
2.0000 mg | Freq: Four times a day (QID) | INTRAMUSCULAR | Status: DC | PRN
  Administered 2016-11-15 (×2): 2 mg via INTRAVENOUS
  Filled 2016-11-15 (×3): qty 1

## 2016-11-15 MED ORDER — DOCUSATE SODIUM 100 MG PO CAPS
100.0000 mg | ORAL_CAPSULE | Freq: Two times a day (BID) | ORAL | Status: DC
  Administered 2016-11-15 – 2016-11-17 (×4): 100 mg via ORAL
  Filled 2016-11-15 (×4): qty 1

## 2016-11-15 NOTE — Progress Notes (Signed)
[facility] PROGRESS NOTE  Rebecca [name] [id number] DOB: Jan 25, 1928 DOA: 11/13/2016  PCP: [name], MD  Brief History/Interval Summary: 83 year old Caucasian female with a past medical history of hypothyroidism, depression, anxiety, seizure disorder, chronic back pain, dementia who lives in skilled nursing facility, presented with worsening confusion and fever. Patient was hospitalized for further management.  Reason for Visit: Altered mental status. Fever.  Consultants: None  Procedures:  Lumbar puncture  EEG Impression The EEG is abnormal with patient recorded in awake and drowsy state only and findings are consistent with Mild generalized cerebral dysfunction due to background slowing. Epileptiform features were not seen during this recording.  Antibiotics: Initially started on vancomycin, cefepime, ampicillin and acyclovir. Ampicillin discontinued.  Subjective/Interval History: Patient remains confused and distracted. But less so compared to yesterday. Denies any pain. Denies any difficulty breathing.   Objective:  Vital Signs  Vitals:   11/14/16 0841 11/14/16 2255 11/15/16 0508 11/15/16 0824  BP:  (!) 119/47 (!) 119/55   Pulse:  (!) 51 (!) 54   Resp:  18 18   Temp:  98 F (36.7 C) 98.2 F (36.8 C)   TempSrc:      SpO2: 98% 100% 93% 96%  Weight:      Height:        Intake/Output Summary (Last 24 hours) at 11/15/16 0853 Last data filed at 11/15/16 0652  Gross per 24 hour  Intake          1831.25 ml  Output             1500 ml  Net           331.25 ml   Filed Weights   11/13/16 1852  Weight: 64 kg (141 lb)    General appearance: Awake, alert. Distracted. No distress Resp: Crackles noted bilateral bases as before. No wheezing. Cardio: S1, S2 is normal, regular. No S3, S4. No rubs, murmurs or bruits GI: Abdomen is soft. Nontender, nondistended. Bowel sounds are present. No masses, organomegaly Extremities: No edema Neurologic: Awake,  alert. Remains distracted. She was able to tell me that she was at [facility]. She thought it was July. She could not tell me the year. Could tell me the name of the president. No facial asymmetry. Tongue is midline. Motor strength appears to be equal bilateral upper and lower extremities.  Lab Results:  Data Reviewed: I have personally reviewed following labs and imaging studies  CBC:  Recent Labs Lab 11/13/16 1917 11/15/16 0347  WBC 11.3* 9.3  NEUTROABS 9.5*  --   HGB 12.7 11.3*  HCT 39.0 35.8*  MCV 97.7 98.6  PLT 136* 127*    Basic Metabolic Panel:  Recent Labs Lab 11/13/16 1917 11/15/16 0347  NA 136 142  K 4.0 3.5  CL 106 111  CO2 25 26  GLUCOSE 123* 93  BUN 18 9  CREATININE 0.75 0.60  CALCIUM 8.2* 8.4*    GFR: Estimated Creatinine Clearance: 42.2 mL/min (by C-G formula based on SCr of 0.6 mg/dL).  Liver Function Tests:  Recent Labs Lab 11/13/16 1917 11/15/16 0347  AST 24 20  ALT 17 17  ALKPHOS 74 54  BILITOT 0.3 0.7  PROT 5.9* 5.4*  ALBUMIN 3.0* 2.7*     Coagulation Profile:  Recent Labs Lab 11/13/16 2229  INR 1.10    CBG:  Recent Labs Lab 11/14/16 0745 11/15/16 0758  GLUCAP 137* 83    Thyroid Function Tests:  Recent Labs  11/13/16 1917  TSH 0.533  T3FREE 2.2     Recent Results (from the past 240 hour(s))  Blood culture (routine x 2)     Status: None (Preliminary result)   Collection Time: 11/13/16  7:17 PM  Result Value Ref Range Status   Specimen Description BLOOD RIGHT FOREARM  Final   Special Requests   Final    BOTTLES DRAWN AEROBIC AND ANAEROBIC Blood Culture adequate volume   Culture NO GROWTH < 24 HOURS  Final   Report Status PENDING  Incomplete  Blood culture (routine x 2)     Status: None (Preliminary result)   Collection Time: 11/13/16  7:20 PM  Result Value Ref Range Status   Specimen Description BLOOD RIGHT HAND  Final   Special Requests   Final    BOTTLES DRAWN AEROBIC AND ANAEROBIC Blood  Culture adequate volume   Culture NO GROWTH < 24 HOURS  Final   Report Status PENDING  Incomplete  MRSA PCR Screening     Status: None   Collection Time: 11/14/16  5:44 AM  Result Value Ref Range Status   MRSA by PCR NEGATIVE NEGATIVE Final    Comment:        The GeneXpert MRSA Assay (FDA approved for NASAL specimens only), is one component of a comprehensive MRSA colonization surveillance program. It is not intended to diagnose MRSA infection nor to guide or monitor treatment for MRSA infections.   CSF culture     Status: None (Preliminary result)   Collection Time: 11/14/16  9:50 AM  Result Value Ref Range Status   Specimen Description CSF  Final   Special Requests NONE  Final   Gram Stain   Final    CYTOSPIN SMEAR WBC PRESENT, PREDOMINANTLY MONONUCLEAR NO ORGANISMS SEEN    Culture PENDING  Incomplete   Report Status PENDING  Incomplete  Respiratory Panel by PCR     Status: None   Collection Time: 11/14/16 12:15 PM  Result Value Ref Range Status   Adenovirus NOT DETECTED NOT DETECTED Final   Coronavirus 229E NOT DETECTED NOT DETECTED Final   Coronavirus HKU1 NOT DETECTED NOT DETECTED Final   Coronavirus NL63 NOT DETECTED NOT DETECTED Final   Coronavirus OC43 NOT DETECTED NOT DETECTED Final   Metapneumovirus NOT DETECTED NOT DETECTED Final   Rhinovirus / Enterovirus NOT DETECTED NOT DETECTED Final   Influenza A NOT DETECTED NOT DETECTED Final   Influenza B NOT DETECTED NOT DETECTED Final   Parainfluenza Virus 1 NOT DETECTED NOT DETECTED Final   Parainfluenza Virus 2 NOT DETECTED NOT DETECTED Final   Parainfluenza Virus 3 NOT DETECTED NOT DETECTED Final   Parainfluenza Virus 4 NOT DETECTED NOT DETECTED Final   Respiratory Syncytial Virus NOT DETECTED NOT DETECTED Final   Bordetella pertussis NOT DETECTED NOT DETECTED Final   Chlamydophila pneumoniae NOT DETECTED NOT DETECTED Final   Mycoplasma pneumoniae NOT DETECTED NOT DETECTED Final      Radiology Studies: Dg  Chest 2 View  Result Date: 11/13/2016 CLINICAL DATA:  Fever and decreased mental status EXAM: CHEST  2 VIEW COMPARISON:  04/02/2015.  03/28/2015. FINDINGS: AP and lateral views of the chest show enlargement of the cardiopericardial silhouette. There is diffuse underlying chronic interstitial lung disease, as before. No definite superimposed pulmonary edema or focal pneumonia. No substantial pleural effusion. The visualized bony structures of the thorax are intact. Telemetry leads overlie the chest. IMPRESSION: Chronic underlying interstitial lung disease, similar to prior studies. No definite superimposed focal pneumonia or pulmonary edema at  this time. Electronically Signed   By: Misty Stanley M.D.   On: 11/13/2016 20:09   Ct Head Wo Contrast  Result Date: 11/13/2016 CLINICAL DATA:  83 year old female with altered mental status and fever. EXAM: CT HEAD WITHOUT CONTRAST TECHNIQUE: Contiguous axial images were obtained from the base of the skull through the vertex without intravenous contrast. COMPARISON:  03/30/2016 and prior exams FINDINGS: Brain: No evidence of acute infarction, hemorrhage, hydrocephalus, extra-axial collection or mass lesion/mass effect. Atrophy, chronic small-vessel white matter ischemic changes and remote left occipital infarct again noted. Vascular: Intracranial atherosclerotic calcifications noted. Skull: Normal. Negative for fracture or focal lesion. Sinuses/Orbits: No acute finding. Other: None. IMPRESSION: No at evidence of acute intracranial abnormality. Atrophy, chronic small-vessel white matter ischemic changes and remote left occipital infarct. Electronically Signed   By: Margarette Canada M.D.   On: 11/13/2016 22:47   Dg Fluoro Guide Lumbar Puncture  Result Date: 11/14/2016 CLINICAL DATA:  Fever of unknown origin.  Altered mental status. EXAM: DIAGNOSTIC LUMBAR PUNCTURE UNDER FLUOROSCOPIC GUIDANCE FLUOROSCOPY TIME:  Fluoroscopy Time:  1 minutes PROCEDURE: Informed consent was  obtained from the patient's son by phone prior to the procedure, including potential complications of headache, allergy, and pain. With the patient prone, the lower back was prepped with Betadine. 1% Lidocaine was used for local anesthesia. Lumbar puncture was performed at the L3-4 level using a 21 gauge needle with return of clear colorless CSF. 20 ml of CSF were obtained for laboratory studies. The patient tolerated the procedure well and there were no apparent complications. IMPRESSION: Lumbar puncture performed without complication. Laboratory results to follow. Electronically Signed   By: Lorriane Shire M.D.   On: 11/14/2016 10:08     Medications:  Scheduled: . calcium-vitamin D  2 tablet Oral Daily  . cholecalciferol  5,000 Units Oral Daily  . clonazePAM  0.5 mg Oral BID  . donepezil  10 mg Oral Daily  . DULoxetine  30 mg Oral Daily  . fluticasone furoate-vilanterol  1 puff Inhalation Daily  . guaiFENesin  600 mg Oral QHS  . levothyroxine  100 mcg Oral QAC breakfast  . lidocaine (PF)  10 mL Intradermal Once  . memantine  10 mg Oral BID  . multivitamin with minerals  1 tablet Oral Daily  . omega-3 acid ethyl esters  1 g Oral Daily  . pantoprazole  40 mg Oral Daily  . phenytoin  100 mg Oral Daily  . phenytoin  200 mg Oral QHS  . QUEtiapine  12.5 mg Oral Q24H  . sodium chloride flush  3 mL Intravenous Q12H   Continuous: . sodium chloride 75 mL/hr at 11/15/16 0122  . acyclovir Stopped (11/14/16 2337)  . ceFEPime (MAXIPIME) IV Stopped (11/14/16 1600)  . vancomycin Stopped (11/15/16 0037)  . vancomycin     BJS:[id number], MUSCLE RUB, ondansetron **OR** ondansetron (ZOFRAN) IV, zolpidem  Assessment/Plan:  Principal Problem:   Sepsis ([place]) Active Problems:   Hypothyroidism   Dementia   Seizure disorder ([place])   Chronic pulmonary embolism (HCC)   GERD (gastroesophageal reflux disease)   Acute metabolic encephalopathy    Sepsis of unknown etiology Patient presented with  leukocytosis, fever, or tachypnea. Lactic acid normal. Pro-calcitonin level less than 0.1. UA did not suggest any infection. Chest x-ray showed chronic interstitial changes. Due to her altered mental status, concern was for meningitis. Patient underwent lumbar puncture by interventional radiology. LP was initially attempted by the emergency department provider but was not successful. CSF WBC cell count was  only 1. Gram stain does not show any organism. Cultures are negative so far. Respiratory viral panel also negative. Chest x-ray will be repeated today. Continue current antibiotics for now including vancomycin, cefepime and acyclovir. Anticipate transition to oral agents tomorrow. She could've also had a viral syndrome.  Acute encephalopathy. This is in the setting of known dementia. CT head did not show any acute findings. Lumbar puncture did not suggest meningitis. Altered mental status could be due to fever and infection. No obvious focal neurological deficits. Discussed with patient's son. Mental status has improved some but not back to baseline. EEG did not show any epileptiform activity. Might be reasonable to pursue MRI brain.  History of dementia. Continue home medication.  History of seizure disorder. Continue Dilantin. Dilantin level was 7.9. EEG as mentioned above.  History of hypothyroidism. TSH normal. Continue home medications.  History of depression and anxiety. Stable. Continue home medications.  DVT Prophylaxis: SCDs    Code Status: DO NOT RESUSCITATE  Family Communication: Discussed with patient's son  Disposition Plan: Management as outlined above.    LOS: 2 days   [place] [place] [facility] Pager [phone] 11/15/2016, 8:53 AM  If 7PM-7AM, please contact night-coverage at [email or web address], password [facility]

## 2016-11-16 ENCOUNTER — Inpatient Hospital Stay (HOSPITAL_COMMUNITY): Payer: Medicare Other | Admitting: Anesthesiology

## 2016-11-16 ENCOUNTER — Inpatient Hospital Stay (HOSPITAL_COMMUNITY): Payer: Medicare Other

## 2016-11-16 ENCOUNTER — Encounter (HOSPITAL_COMMUNITY)
Admission: EM | Disposition: A | Discharge status: Skilled Nursing Facility | Payer: Self-pay | Source: Home / Self Care | Attending: Internal Medicine

## 2016-11-16 DIAGNOSIS — D494 Neoplasm of unspecified behavior of bladder: Secondary | ICD-10-CM | POA: Diagnosis not present

## 2016-11-16 DIAGNOSIS — R310 Gross hematuria: Secondary | ICD-10-CM | POA: Diagnosis not present

## 2016-11-16 DIAGNOSIS — A419 Sepsis, unspecified organism: Secondary | ICD-10-CM | POA: Diagnosis not present

## 2016-11-16 DIAGNOSIS — E785 Hyperlipidemia, unspecified: Secondary | ICD-10-CM | POA: Diagnosis not present

## 2016-11-16 HISTORY — PX: TRANSURETHRAL RESECTION OF BLADDER TUMOR: SHX2575

## 2016-11-16 HISTORY — PX: CYSTOSCOPY WITH RETROGRADE PYELOGRAM, URETEROSCOPY AND STENT PLACEMENT: SHX5789

## 2016-11-16 LAB — URINALYSIS, ROUTINE W REFLEX MICROSCOPIC
Bacteria, UA: NONE SEEN
Squamous Epithelial / LPF: NONE SEEN

## 2016-11-16 LAB — URINALYSIS, MICROSCOPIC (REFLEX)
Bacteria, UA: NONE SEEN
Squamous Epithelial / LPF: NONE SEEN

## 2016-11-16 LAB — CBC
HEMATOCRIT: 37.6 % (ref 36.0–46.0)
Hemoglobin: 12.1 g/dL (ref 12.0–15.0)
MCH: 31.4 pg (ref 26.0–34.0)
MCHC: 32.2 g/dL (ref 30.0–36.0)
MCV: 97.7 fL (ref 78.0–100.0)
PLATELETS: 159 10*3/uL (ref 150–400)
RBC: 3.85 MIL/uL — ABNORMAL LOW (ref 3.87–5.11)
RDW: 12.9 % (ref 11.5–15.5)
WBC: 8 10*3/uL (ref 4.0–10.5)

## 2016-11-16 LAB — GLUCOSE, CAPILLARY: GLUCOSE-CAPILLARY: 143 mg/dL — AB (ref 65–99)

## 2016-11-16 LAB — URINE CULTURE

## 2016-11-16 SURGERY — TURBT (TRANSURETHRAL RESECTION OF BLADDER TUMOR)
Anesthesia: General | Site: Ureter

## 2016-11-16 SURGERY — CYSTOSCOPY, WITH RETROGRADE PYELOGRAM
Anesthesia: Choice

## 2016-11-16 MED ORDER — SUGAMMADEX SODIUM 200 MG/2ML IV SOLN
INTRAVENOUS | Status: DC | PRN
Start: 2016-11-16 — End: 2016-11-16
  Administered 2016-11-16: 130 mg via INTRAVENOUS

## 2016-11-16 MED ORDER — FENTANYL CITRATE (PF) 100 MCG/2ML IJ SOLN: 25.0000 ug | INTRAMUSCULAR | Status: DC | PRN

## 2016-11-16 MED ORDER — ROCURONIUM BROMIDE 10 MG/ML (PF) SYRINGE
PREFILLED_SYRINGE | INTRAVENOUS | Status: DC | PRN
  Administered 2016-11-16: 20 mg via INTRAVENOUS

## 2016-11-16 MED ORDER — SODIUM CHLORIDE 0.9 % IR SOLN
Status: DC | PRN
  Administered 2016-11-16 (×3): 3000 mL

## 2016-11-16 MED ORDER — FENTANYL CITRATE (PF) 100 MCG/2ML IJ SOLN
INTRAMUSCULAR | Status: AC
  Filled 2016-11-16: qty 2

## 2016-11-16 MED ORDER — PROPOFOL 10 MG/ML IV BOLUS
INTRAVENOUS | Status: AC
  Filled 2016-11-16: qty 20

## 2016-11-16 MED ORDER — DEXTROSE 5 % IV SOLN
1.0000 g | INTRAVENOUS | Status: DC
  Administered 2016-11-16 – 2016-11-17 (×2): 1 g via INTRAVENOUS
  Filled 2016-11-16 (×2): qty 10

## 2016-11-16 MED ORDER — FENTANYL CITRATE (PF) 100 MCG/2ML IJ SOLN
INTRAMUSCULAR | Status: DC | PRN
  Administered 2016-11-16: 25 ug via INTRAVENOUS

## 2016-11-16 MED ORDER — LACTATED RINGERS IV SOLN: INTRAVENOUS | Status: DC

## 2016-11-16 MED ORDER — 0.9 % SODIUM CHLORIDE (POUR BTL) OPTIME
TOPICAL | Status: DC | PRN
  Administered 2016-11-16: 1000 mL

## 2016-11-16 MED ORDER — TRAMADOL-ACETAMINOPHEN 37.5-325 MG PO TABS: 1.0000 | ORAL_TABLET | Freq: Four times a day (QID) | ORAL | Status: DC | PRN

## 2016-11-16 MED ORDER — PROPOFOL 10 MG/ML IV BOLUS
INTRAVENOUS | Status: DC | PRN
  Administered 2016-11-16: 30 mg via INTRAVENOUS
  Administered 2016-11-16: 70 mg via INTRAVENOUS

## 2016-11-16 MED ORDER — ONDANSETRON HCL 4 MG/2ML IJ SOLN
INTRAMUSCULAR | Status: DC | PRN
  Administered 2016-11-16: 4 mg via INTRAVENOUS

## 2016-11-16 MED ORDER — LIDOCAINE 2% (20 MG/ML) 5 ML SYRINGE
INTRAMUSCULAR | Status: DC | PRN
  Administered 2016-11-16: 50 mg via INTRAVENOUS

## 2016-11-16 MED ORDER — SODIUM CHLORIDE 0.9 % IV SOLN
INTRAVENOUS | Status: DC | PRN
  Administered 2016-11-16: 10 mL

## 2016-11-16 MED ORDER — LACTATED RINGERS IV SOLN
INTRAVENOUS | Status: DC | PRN
  Administered 2016-11-16: 17:00:00 via INTRAVENOUS

## 2016-11-16 SURGICAL SUPPLY — 32 items
BAG URINE DRAINAGE (UROLOGICAL SUPPLIES) ×4 IMPLANT
BAG URO CATCHER STRL LF (MISCELLANEOUS) ×4 IMPLANT
BASKET STONE NCOMPASS (UROLOGICAL SUPPLIES) IMPLANT
CATH FOLEY 2WAY SLVR  5CC 20FR (CATHETERS) ×2
CATH FOLEY 2WAY SLVR 5CC 20FR (CATHETERS) ×2 IMPLANT
CATH FOLEY 3WAY 30CC 22FR (CATHETERS) IMPLANT
CATH URET 5FR 28IN OPEN ENDED (CATHETERS) ×4 IMPLANT
CATH URET DUAL LUMEN 6-10FR 50 (CATHETERS) IMPLANT
CLOTH BEACON ORANGE TIMEOUT ST (SAFETY) ×4 IMPLANT
COVER SURGICAL LIGHT HANDLE (MISCELLANEOUS) ×4 IMPLANT
EXTRACTOR STONE NITINOL NGAGE (UROLOGICAL SUPPLIES) IMPLANT
FIBER LASER FLEXIVA 1000 (UROLOGICAL SUPPLIES) IMPLANT
FIBER LASER FLEXIVA 365 (UROLOGICAL SUPPLIES) IMPLANT
FIBER LASER FLEXIVA 550 (UROLOGICAL SUPPLIES) IMPLANT
GLOVE SURG SS PI 8.0 STRL IVOR (GLOVE) ×4 IMPLANT
GOWN STRL REUS W/TWL XL LVL3 (GOWN DISPOSABLE) ×4 IMPLANT
GUIDEWIRE STR DUAL SENSOR (WIRE) ×4 IMPLANT
HOLDER FOLEY CATH W/STRAP (MISCELLANEOUS) ×4 IMPLANT
IV NS 1000ML (IV SOLUTION) ×2
IV NS 1000ML BAXH (IV SOLUTION) ×2 IMPLANT
IV NS IRRIG 3000ML ARTHROMATIC (IV SOLUTION) ×4 IMPLANT
LOOP CUT BIPOLAR 24F LRG (ELECTROSURGICAL) ×4 IMPLANT
MANIFOLD NEPTUNE II (INSTRUMENTS) ×4 IMPLANT
PACK CYSTO (CUSTOM PROCEDURE TRAY) ×4 IMPLANT
SET ASPIRATION TUBING (TUBING) ×4 IMPLANT
SHEATH ACCESS URETERAL 38CM (SHEATH) ×4 IMPLANT
SHEATH URET ACCESS 10/12FR (MISCELLANEOUS) IMPLANT
SUT ETHILON 3 0 PS 1 (SUTURE) IMPLANT
SYR 30ML LL (SYRINGE) ×4 IMPLANT
SYRINGE IRR TOOMEY STRL 70CC (SYRINGE) ×4 IMPLANT
TUBING CONNECTING 10 (TUBING) ×3 IMPLANT
TUBING CONNECTING 10' (TUBING) ×1

## 2016-11-16 NOTE — Transfer of Care (Signed)
Immediate Anesthesia Transfer of Care Note  Patient: Alayzia Vickey Sages  Procedure(s) Performed: Procedure(s): TRANSURETHRAL RESECTION OF BLADDER TUMOR (TURBT) (N/A) CYSTOSCOPY WITH RETROGRADE PYELOGRAMS/CLOT EVACUATION (Bilateral)  Patient Location: PACU  Anesthesia Type:General  Level of Consciousness:  sedated, patient cooperative and responds to stimulation  Airway & Oxygen Therapy:Patient Spontanous Breathing and Patient connected to face mask oxgen  Post-op Assessment:  Report given to PACU RN and Post -op Vital signs reviewed and stable  Post vital signs:  Reviewed and stable  Last Vitals:  Vitals:   11/16/16 0625 11/16/16 1543  BP: 107/82 123/70  Pulse: 73 63  Resp: 18 17  Temp: 36.8 C 07.2 C    Complications: No apparent anesthesia complications

## 2016-11-16 NOTE — Anesthesia Procedure Notes (Signed)
Procedure Name: Intubation Date/Time: 11/16/2016 5:13 PM Performed by: Anne Fu Pre-anesthesia Checklist: Patient identified, Emergency Drugs available, Suction available, Patient being monitored and Timeout performed Patient Re-evaluated:Patient Re-evaluated prior to inductionOxygen Delivery Method: Circle system utilized Preoxygenation: Pre-oxygenation with 100% oxygen Intubation Type: IV induction Ventilation: Mask ventilation without difficulty Laryngoscope Size: Mac and 4 Grade View: Grade I Tube type: Oral Tube size: 7.5 mm Number of attempts: 1 Airway Equipment and Method: Stylet Placement Confirmation: ETT inserted through vocal cords under direct vision,  positive ETCO2 and breath sounds checked- equal and bilateral Tube secured with: Tape Dental Injury: Teeth and Oropharynx as per pre-operative assessment  Comments: Slight abrasion noted to upper lip left side post intubation. No noted swelling present teeth as per pre-op. Ointment applied to help to moisten lips.

## 2016-11-16 NOTE — Evaluation (Signed)
Occupational Therapy Evaluation Patient Details Name: Rebecca [name] MRN: [id number] DOB: 05/24/1927 Today's Date: 11/16/2016    History of Present Illness Pt is an 83 yo female admitted through ED on 11/13/16 with a diagnosis of sepsis, leukocytosis, and acute encephalopathy. PMH significant for GERD, dementia, hypothryoid, depression, anxiety, siezure, DJD, PE.    Clinical Impression   Pt admitted with above. She demonstrates the below listed deficits and will benefit from continued OT to maximize safety and independence with BADLs.  Pt presents to OT with generalized weakness, decreased balance, decreased activity tolerance, and impaired cognition.  She requires min A, overall with ADLs.  She lives in ALF, and would benefit from SNF for rehab prior to returning to ALF.  Will follow acutely.       Follow Up Recommendations  SNF    Equipment Recommendations  None recommended by OT    Recommendations for Other Services       Precautions / Restrictions Precautions Precautions: Fall Restrictions Weight Bearing Restrictions: No      Mobility Bed Mobility Overal bed mobility: Needs Assistance Bed Mobility: Supine to Sit;Sit to Supine     Supine to sit: Min guard Sit to supine: Min guard   General bed mobility comments: Min A to bring LE's into and out of bed  Transfers Overall transfer level: Needs assistance Equipment used: Rolling walker (2 wheeled) Transfers: Sit to/from [facility] Sit to Stand: Min guard Stand pivot transfers: Min assist       General transfer comment: min A to steady     Balance Overall balance assessment: Needs assistance Sitting-balance support: No upper extremity supported;Feet supported Sitting balance-Leahy Scale: Fair     Standing balance support: Single extremity supported;During functional activity Standing balance-Leahy Scale: Poor Standing balance comment: reliant on UE support                             ADL either performed or assessed with clinical judgement   ADL Overall ADL's : Needs assistance/impaired Eating/Feeding: NPO   Grooming: Wash/dry hands;Wash/dry face;Oral care;Brushing hair;Min guard;Standing   Upper Body Bathing: Minimal assistance;Sitting   Lower Body Bathing: Minimal assistance;Sit to/from stand   Upper Body Dressing : Set up;Supervision/safety;Sitting   Lower Body Dressing: Minimal assistance;Sit to/from stand   Toilet Transfer: Minimal assistance;Ambulation;Comfort height toilet;Grab bars;RW   Toileting- Clothing Manipulation and Hygiene: Minimal assistance;Sit to/from stand       Functional mobility during ADLs: Minimal assistance;Rolling walker General ADL Comments: Pt requires min A to maneuver walker in small spaces, for balance and for safety      Vision Baseline Vision/History: Wears glasses Wears Glasses: At all times       Perception     Praxis      Pertinent Vitals/Pain Pain Assessment: No/denies pain Faces Pain Scale: Hurts even more Pain Location: with urination Pain Descriptors / Indicators: Grimacing;Guarding Pain Intervention(s): Monitored during session;Repositioned     Hand Dominance Right   Extremity/Trunk Assessment Upper Extremity Assessment Upper Extremity Assessment: Generalized weakness   Lower Extremity Assessment Lower Extremity Assessment: Defer to PT evaluation   Cervical / Trunk Assessment Cervical / Trunk Assessment: Kyphotic   Communication Communication Communication: No difficulties   Cognition Arousal/Alertness: Awake/alert Behavior During Therapy: WFL for tasks assessed/performed Overall Cognitive Status: History of cognitive impairments - at baseline  General Comments  Pt with blood with urination - MD aware     Exercises     Shoulder Instructions      Home Living Family/patient expects to be discharged to:: Skilled nursing facility                              Home Equipment: Gilford Rile - 2 wheels;Bedside commode   Additional Comments: Lives at [facility] in ALF       Prior Functioning/Environment Level of Independence: Needs assistance  Gait / Transfers Assistance Needed: ambulatory with a RW ADL's / Homemaking Assistance Needed: Per son, pt was mod I with ADLs    Comments: all information obtained from pt, unsure of accuracy        OT Problem List: Decreased strength;Decreased activity tolerance;Impaired balance (sitting and/or standing);Decreased cognition;Decreased safety awareness;Decreased knowledge of use of DME or AE      OT Treatment/Interventions: Self-care/ADL training;DME and/or AE instruction;Therapeutic activities;Cognitive remediation/compensation;Patient/family education;Balance training    OT Goals(Current goals can be found in the care plan section) Acute Rehab OT Goals Patient Stated Goal: to put on my clothes  OT Goal Formulation: With patient/family Time For Goal Achievement: 11/30/16 Potential to Achieve Goals: Good ADL Goals Pt Will Perform Grooming: with supervision;standing Pt Will Perform Upper Body Bathing: with supervision;sitting Pt Will Perform Lower Body Bathing: with supervision;sit to/from stand Pt Will Perform Upper Body Dressing: with supervision;sitting Pt Will Perform Lower Body Dressing: with supervision;sit to/from stand Pt Will Transfer to Toilet: with supervision;ambulating;regular height toilet;bedside commode;grab bars Pt Will Perform Toileting - Clothing Manipulation and hygiene: with supervision;sit to/from stand  OT Frequency: Min 2X/week   Barriers to D/C: Decreased caregiver support          Co-evaluation              AM-PAC PT "6 Clicks" Daily Activity     Outcome Measure Help from another person eating meals?: None Help from another person taking care of personal grooming?: A Little Help from another person toileting, which  includes using toliet, bedpan, or urinal?: A Little Help from another person bathing (including washing, rinsing, drying)?: A Little Help from another person to put on and taking off regular upper body clothing?: A Little Help from another person to put on and taking off regular lower body clothing?: A Little 6 Click Score: 19   End of Session Equipment Utilized During Treatment: Rolling walker;Gait belt Nurse Communication: Mobility status  Activity Tolerance: Patient tolerated treatment well Patient left: in chair;with call bell/phone within reach;with chair alarm set;with family/visitor present  OT Visit Diagnosis: Unsteadiness on feet (R26.81)                Time: 4098-1191 OT Time Calculation (min): 40 min Charges:  OT General Charges $OT Visit: 1 Procedure OT Evaluation $OT Eval Moderate Complexity: 1 Procedure OT Treatments $Self Care/Home Management : 23-37 mins G-Codes:     [facility], OTR/L [phone]   Leighana Neyman M 11/16/2016, 3:03 PM

## 2016-11-16 NOTE — Progress Notes (Addendum)
Orders was put in to transfer pt to [place] at 1317. Nurse secret Tomasita Morrow was inform to get a bed for pt, she was unable to do it before she finished her shift. The new nurse secretary had to go through the process of getting a bed at [facility] for pt. The delay was as a result of [facility] not working on getting bed for pt on time. Pt was transferred by [facility]. Report was called to [place] on [address].

## 2016-11-16 NOTE — Progress Notes (Signed)
Assumed care of patient from Guerry Bruin, RN

## 2016-11-16 NOTE — Brief Op Note (Signed)
11/13/2016 - 11/16/2016  5:46 PM  PATIENT:  Rebecca [name]  83 y.o. 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  PROCEDURE:  Procedure(s): TRANSURETHRAL RESECTION OF BLADDER TUMOR (TURBT) (N/A) 2-5 cm CYSTOSCOPY WITH RETROGRADE PYELOGRAMS/CLOT EVACUATION (Bilateral)  SURGEON:  Surgeon(s) and Role:    Irine Seal, MD - Primary  PHYSICIAN ASSISTANT:   ASSISTANTS: none   ANESTHESIA:   general  EBL:  Total I/O In: 925.8 [I.V.:875.8; IV Piggyback:50] Out: -   BLOOD ADMINISTERED:none  DRAINS: Urinary Catheter (Foley)   LOCAL MEDICATIONS USED:  NONE  SPECIMEN:  Source of Specimen:  right bladder wall tumor chips  DISPOSITION OF SPECIMEN:  PATHOLOGY  COUNTS:  YES  TOURNIQUET:  * No tourniquets in log *  DICTATION: .Other Dictation: Dictation Number [id number]  PLAN OF CARE: Admit to inpatient   PATIENT DISPOSITION:  PACU - hemodynamically stable.   Delay start of Pharmacological VTE agent (>24hrs) due to surgical blood loss or risk of bleeding: yes

## 2016-11-16 NOTE — Anesthesia Postprocedure Evaluation (Signed)
Anesthesia Post Note  Patient: Rebecca [name]  Procedure(s) Performed: Procedure(s) (LRB): TRANSURETHRAL RESECTION OF BLADDER TUMOR (TURBT) (N/A) CYSTOSCOPY WITH RETROGRADE PYELOGRAMS/CLOT EVACUATION (Bilateral)     Patient location during evaluation: PACU Anesthesia Type: General Level of consciousness: awake and alert Pain management: pain level controlled Vital Signs Assessment: post-procedure vital signs reviewed and stable Respiratory status: spontaneous breathing, nonlabored ventilation, respiratory function stable and patient connected to nasal cannula oxygen Cardiovascular status: blood pressure returned to baseline and stable Postop Assessment: no signs of nausea or vomiting Anesthetic complications: no    Last Vitals:  Vitals:   11/16/16 1759 11/16/16 1800  BP: (!) 139/101 (!) 151/82  Pulse: 70 71  Resp: 18 18  Temp: 36.9 C     Last Pain:  Vitals:   11/16/16 1543  TempSrc: Oral                 Tiajuana Amass

## 2016-11-16 NOTE — Progress Notes (Addendum)
Assisted the patient to the bedside commode.  In the bedside commode there was small amount of bloody urine.  Notified the Dr. [place] Pink.  Will continue to monitor

## 2016-11-16 NOTE — Progress Notes (Signed)
[name] transferred to [facility] long [address] [place]   per MD order.  Discussed with the patient and family, all questions fully answered.  VSS, Skin clean, dry and intact without evidence of skin break down, no evidence of skin tears noted. IV catheter was sent with pt intact.  emtala forms, and all other needed forms was printed and given to [facility] medical personnel   Patient escorted via stretcher, and transferred to [facility] long via ambulance.  [name] 11/16/2016 7:51 PM

## 2016-11-16 NOTE — Progress Notes (Addendum)
[facility] PROGRESS NOTE  Rebecca [name] RUE:[id number] DOB: 1927-12-06 DOA: 11/13/2016  PCP: [name], MD  Brief History/Interval Summary: 83 year old Caucasian female with a past medical history of hypothyroidism, depression, anxiety, seizure disorder, chronic back pain, dementia who lives in skilled nursing facility, presented with worsening confusion and fever. Patient was hospitalized for further management.  Reason for Visit: Hematuria  Consultants: Urology  Procedures:  Lumbar puncture  EEG Impression The EEG is abnormal with patient recorded in awake and drowsy state only and findings are consistent with Mild generalized cerebral dysfunction due to background slowing. Epileptiform features were not seen during this recording.  Antibiotics: Initially started on vancomycin, cefepime, ampicillin and acyclovir. Ampicillin discontinued 6/29.  Acyclovir, vancomycin and cefepime to be discontinued today.  Subjective/Interval History: Patient much more awake, alert. However, she remains confused and distracted. Denies any complaints. Does mention that she has noticed blood in her urine. Denies any abdominal pain. No nausea, vomiting   Objective:  Vital Signs  Vitals:   11/15/16 0824 11/15/16 2134 11/16/16 0625 11/16/16 0815  BP:  140/69 107/82   Pulse:  67 73   Resp:  18 18   Temp:  97.9 F (36.6 C) 98.2 F (36.8 C)   TempSrc:      SpO2: 96% 93% 92% 93%  Weight:      Height:        Intake/Output Summary (Last 24 hours) at 11/16/16 0917 Last data filed at 11/15/16 1230  Gross per 24 hour  Intake                0 ml  Output                1 ml  Net               -1 ml   Filed Weights   11/13/16 1852  Weight: 64 kg (141 lb)    General appearance: Awake and alert. Distracted. Less so than before. No distress. Resp: Continues to have coarse crackles at the bases as before. No wheezing. Cardio: S1, S2 is normal, regular. No S3, S4. No rubs,  murmurs, or bruit GI: Abdomen is soft. Nontender, nondistended. Bowel sounds are present. No masses or organomegaly Extremities: No edema Neurologic: Awake and alert. Less distracted compared to yesterday. Oriented to place, person. Was able to tell me that she was in the hospital. No focal neurological deficits otherwise.  Lab Results:  Data Reviewed: I have personally reviewed following labs and imaging studies  CBC:  Recent Labs Lab 11/13/16 1917 11/15/16 0347  WBC 11.3* 9.3  NEUTROABS 9.5*  --   HGB 12.7 11.3*  HCT 39.0 35.8*  MCV 97.7 98.6  PLT 136* 127*    Basic Metabolic Panel:  Recent Labs Lab 11/13/16 1917 11/15/16 0347  NA 136 142  K 4.0 3.5  CL 106 111  CO2 25 26  GLUCOSE 123* 93  BUN 18 9  CREATININE 0.75 0.60  CALCIUM 8.2* 8.4*    GFR: Estimated Creatinine Clearance: 42.2 mL/min (by C-G formula based on SCr of 0.6 mg/dL).  Liver Function Tests:  Recent Labs Lab 11/13/16 1917 11/15/16 0347  AST 24 20  ALT 17 17  ALKPHOS 74 54  BILITOT 0.3 0.7  PROT 5.9* 5.4*  ALBUMIN 3.0* 2.7*     Coagulation Profile:  Recent Labs Lab 11/13/16 2229  INR 1.10    CBG:  Recent Labs Lab 11/14/16 0745 11/15/16 0758  GLUCAP 137* 83  Thyroid Function Tests:  Recent Labs  11/13/16 1917  TSH 0.533  T3FREE 2.2     Recent Results (from the past 240 hour(s))  Blood culture (routine x 2)     Status: None (Preliminary result)   Collection Time: 11/13/16  7:17 PM  Result Value Ref Range Status   Specimen Description BLOOD RIGHT FOREARM  Final   Special Requests   Final    BOTTLES DRAWN AEROBIC AND ANAEROBIC Blood Culture adequate volume   Culture NO GROWTH 2 DAYS  Final   Report Status PENDING  Incomplete  Blood culture (routine x 2)     Status: None (Preliminary result)   Collection Time: 11/13/16  7:20 PM  Result Value Ref Range Status   Specimen Description BLOOD RIGHT HAND  Final   Special Requests   Final    BOTTLES DRAWN AEROBIC  AND ANAEROBIC Blood Culture adequate volume   Culture NO GROWTH 2 DAYS  Final   Report Status PENDING  Incomplete  MRSA PCR Screening     Status: None   Collection Time: 11/14/16  5:44 AM  Result Value Ref Range Status   MRSA by PCR NEGATIVE NEGATIVE Final    Comment:        The GeneXpert MRSA Assay (FDA approved for NASAL specimens only), is one component of a comprehensive MRSA colonization surveillance program. It is not intended to diagnose MRSA infection nor to guide or monitor treatment for MRSA infections.   CSF culture     Status: None (Preliminary result)   Collection Time: 11/14/16  9:50 AM  Result Value Ref Range Status   Specimen Description CSF  Final   Special Requests NONE  Final   Gram Stain   Final    CYTOSPIN SMEAR WBC PRESENT, PREDOMINANTLY MONONUCLEAR NO ORGANISMS SEEN    Culture NO GROWTH 1 DAY  Final   Report Status PENDING  Incomplete  Respiratory Panel by PCR     Status: None   Collection Time: 11/14/16 12:15 PM  Result Value Ref Range Status   Adenovirus NOT DETECTED NOT DETECTED Final   Coronavirus 229E NOT DETECTED NOT DETECTED Final   Coronavirus HKU1 NOT DETECTED NOT DETECTED Final   Coronavirus NL63 NOT DETECTED NOT DETECTED Final   Coronavirus OC43 NOT DETECTED NOT DETECTED Final   Metapneumovirus NOT DETECTED NOT DETECTED Final   Rhinovirus / Enterovirus NOT DETECTED NOT DETECTED Final   Influenza A NOT DETECTED NOT DETECTED Final   Influenza B NOT DETECTED NOT DETECTED Final   Parainfluenza Virus 1 NOT DETECTED NOT DETECTED Final   Parainfluenza Virus 2 NOT DETECTED NOT DETECTED Final   Parainfluenza Virus 3 NOT DETECTED NOT DETECTED Final   Parainfluenza Virus 4 NOT DETECTED NOT DETECTED Final   Respiratory Syncytial Virus NOT DETECTED NOT DETECTED Final   Bordetella pertussis NOT DETECTED NOT DETECTED Final   Chlamydophila pneumoniae NOT DETECTED NOT DETECTED Final   Mycoplasma pneumoniae NOT DETECTED NOT DETECTED Final       Radiology Studies: Dg Chest 2 View  Result Date: 11/15/2016 CLINICAL DATA:  Cough. EXAM: CHEST  2 VIEW COMPARISON:  11/13/2016, chest CT 03/28/2015 FINDINGS: The cardiac silhouette is stably enlarged. Mediastinal contours appear intact. There is no evidence of focal airspace consolidation, pleural effusion or pneumothorax. Chronic severe interstitial disease, stable in appearance. Osseous structures are without acute abnormality. Soft tissues are grossly normal. IMPRESSION: Severe chronic interstitial lung disease. No definite evidence of superimposed acute airspace consolidation. Mild cardiomegaly. Electronically Signed  By: Fidela Salisbury M.D.   On: 11/15/2016 13:37   Mr Brain Wo Contrast  Result Date: 11/16/2016 CLINICAL DATA:  Dementia.  Seizure disorder.  Acute encephalopathy. EXAM: MRI HEAD WITHOUT CONTRAST TECHNIQUE: Multiplanar, multiecho pulse sequences of the brain and surrounding structures were obtained without intravenous contrast. COMPARISON:  CT 11/13/2016. FINDINGS: Brain: Diffusion imaging does not show any acute or subacute infarction or other cause of restricted diffusion. There chronic small-vessel ischemic changes of the pons. Cerebellar atrophy without focal insult. Cerebral hemispheres show generalized atrophy with an old left occipital cortical and subcortical infarction and chronic small-vessel ischemic changes of the hemispheric white matter. No mass lesion, hemorrhage, hydrocephalus or extra-axial collection. No pituitary mass. Vascular: Major vessels at the base of the brain show flow. Skull and upper cervical spine: Negative Sinuses/Orbits: Clear/normal Other: None significant IMPRESSION: No acute or reversible finding. Atrophy and chronic small-vessel ischemic changes. 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 Electronically Signed   By: Nelson Chimes M.D.   On: 11/16/2016 07:49   Dg Fluoro Guide Lumbar Puncture  Result Date: 11/14/2016 CLINICAL DATA:  Fever of  unknown origin.  Altered mental status. EXAM: DIAGNOSTIC LUMBAR PUNCTURE UNDER FLUOROSCOPIC GUIDANCE FLUOROSCOPY TIME:  Fluoroscopy Time:  1 minutes PROCEDURE: Informed consent was obtained from the patient's son by phone prior to the procedure, including potential complications of headache, allergy, and pain. With the patient prone, the lower back was prepped with Betadine. 1% Lidocaine was used for local anesthesia. Lumbar puncture was performed at the L3-4 level using a 21 gauge needle with return of clear colorless CSF. 20 ml of CSF were obtained for laboratory studies. The patient tolerated the procedure well and there were no apparent complications. IMPRESSION: Lumbar puncture performed without complication. Laboratory results to follow. Electronically Signed   By: Lorriane Shire M.D.   On: 11/14/2016 10:08     Medications:  Scheduled: . calcium-vitamin D  2 tablet Oral Daily  . cholecalciferol  5,000 Units Oral Daily  . clonazePAM  0.5 mg Oral BID  . docusate sodium  100 mg Oral BID  . donepezil  10 mg Oral Daily  . DULoxetine  30 mg Oral Daily  . fluticasone furoate-vilanterol  1 puff Inhalation Daily  . guaiFENesin  600 mg Oral QHS  . levothyroxine  100 mcg Oral QAC breakfast  . lidocaine (PF)  10 mL Intradermal Once  . memantine  10 mg Oral BID  . multivitamin with minerals  1 tablet Oral Daily  . omega-3 acid ethyl esters  1 g Oral Daily  . pantoprazole  40 mg Oral Daily  . phenytoin  100 mg Oral Daily  . phenytoin  200 mg Oral QHS  . QUEtiapine  12.5 mg Oral Q24H  . sodium chloride flush  3 mL Intravenous Q12H   Continuous: . sodium chloride 10 mL/hr at 11/15/16 0903  . ceFEPime (MAXIPIME) IV Stopped (11/15/16 1654)  . vancomycin Stopped (11/16/16 0037)  . vancomycin     XWR:[id number], haloperidol lactate, MUSCLE RUB, ondansetron **OR** ondansetron (ZOFRAN) IV, zolpidem  Assessment/Plan:  Principal Problem:   Sepsis ([place]) Active Problems:   Hypothyroidism    Dementia   Seizure disorder ([place])   Chronic pulmonary embolism ([facility])   GERD (gastroesophageal reflux disease)   Acute metabolic encephalopathy    Sepsis of unknown etiology Patient presented with leukocytosis, fever, or tachypnea. Lactic acid normal. Pro-calcitonin level less than 0.1. UA did not suggest any infection. Chest x-ray showed chronic interstitial changes. Due to her altered  mental status, concern was for meningitis. Patient underwent lumbar puncture by interventional radiology. CSF WBC cell count was only 1. Gram stain does not show any organism. Cultures are negative so far. Respiratory viral panel also negative. Chest x-ray was repeated and once again does not show any infiltrates. Shows chronic interstitial changes as seen before. HSV PCR is negative. We will discontinue broad-spectrum coverage. We will leave her on ceftriaxone for now due to her urological issues. She could've also had a viral syndrome. She has improved and has been afebrile.  Acute encephalopathy. This is in the setting of known dementia. CT head did not show any acute findings. Lumbar puncture did not suggest meningitis. Altered mental status could be due to fever and infection. No obvious focal neurological deficits. MRI brain was done which did not show any acute findings. EEG did not show any epileptiform activity. Mental status appears to be close to baseline now.  Gross hematuria Nursing staff reported blood in the urine this morning. No previous history of same. Previous UAs reviewed, and she's had some hemoglobin/BC's in the urine noted last year. She's never had significant amount of blood in the urine. She's never had any urological issues. Patient underwent CT scan this morning. A nonobstructing kidney stone was seen. However, more concerning was the density was noted in the urinary bladder, which could be a hematoma or tumor or both. Urology consulted.  History of dementia. Continue home  medication.  History of seizure disorder. Continue Dilantin. EEG as mentioned above.  History of hypothyroidism. TSH normal. Continue home medications.  History of depression and anxiety. Stable. Continue home medications.  DVT Prophylaxis: SCDs    Code Status: DO NOT RESUSCITATE  Family Communication: Discussed with patient's son  Disposition Plan: Management as outlined above.    LOS: 3 days   [facility] Pager [phone] 11/16/2016, 9:17 AM  If 7PM-7AM, please contact night-coverage at [email or web address], password [facility]

## 2016-11-16 NOTE — Anesthesia Preprocedure Evaluation (Addendum)
Anesthesia Evaluation  Patient identified by MRN, date of birth, ID band Patient awake    Reviewed: Allergy & Precautions, NPO status , Patient's Chart, lab work & pertinent test results  Airway Mallampati: II  TM Distance: >3 FB Neck ROM: Full    Dental  (+) Dental Advisory Given   Pulmonary former smoker,    breath sounds clear to auscultation       Cardiovascular + Peripheral Vascular Disease (s/p IVC filter for PE)   Rhythm:Regular Rate:Normal     Neuro/Psych Seizures -,  Anxiety Depression Dementia and altered mental status    GI/Hepatic Neg liver ROS, GERD  ,  Endo/Other  Hypothyroidism   Renal/GU Renal diseaseHematuria     Musculoskeletal  (+) Arthritis ,   Abdominal   Peds  Hematology  (+) anemia ,   Anesthesia Other Findings   Reproductive/Obstetrics                            Lab Results  Component Value Date   WBC 8.0 11/16/2016   HGB 12.1 11/16/2016   HCT 37.6 11/16/2016   MCV 97.7 11/16/2016   PLT 159 11/16/2016   Lab Results  Component Value Date   CREATININE 0.60 11/15/2016   BUN 9 11/15/2016   NA 142 11/15/2016   K 3.5 11/15/2016   CL 111 11/15/2016   CO2 26 11/15/2016    Anesthesia Physical Anesthesia Plan  ASA: III  Anesthesia Plan: General   Post-op Pain Management:    Induction: Intravenous  PONV Risk Score and Plan: 4 or greater and Ondansetron, Dexamethasone, Propofol and Treatment may vary due to age or medical condition  Airway Management Planned: Oral ETT  Additional Equipment:   Intra-op Plan:   Post-operative Plan: Extubation in OR  Informed Consent: I have reviewed the patients History and Physical, chart, labs and discussed the procedure including the risks, benefits and alternatives for the proposed anesthesia with the patient or authorized representative who has indicated his/her understanding and acceptance.   Dental advisory  given  Plan Discussed with: CRNA  Anesthesia Plan Comments:         Anesthesia Quick Evaluation

## 2016-11-16 NOTE — Evaluation (Signed)
Physical Therapy Evaluation Patient Details Name: Rebecca [name] MRN: [id number] DOB: 05/24/1927 Today's Date: 11/16/2016   History of Present Illness  Pt is an 83 yo female admitted through ED on 11/13/16 with a diagnosis of sepsis, leukocytosis, and acute encephalopathy. PMH significant for GERD, dementia, hypothryoid, depression, anxiety, siezure, DJD, PE.   Clinical Impression  Pt presents with the above diagnosis and below deficits for therapy evaluation. Prior to admission, pt lived in an ALF at [name] where she received assistance for ADLs. Pt requires Min A for all mobility this session and a slight decrease in confusion noted as compared to previous notes. Pt will benefit from SNF at discharge in order to improve mobility prior to discharge. Pt continues to require acute rehab services in order to assist prior to DC.     Follow Up Recommendations SNF;Supervision/Assistance - 24 hour    Equipment Recommendations  None recommended by PT    Recommendations for Other Services       Precautions / Restrictions Precautions Precautions: Fall Restrictions Weight Bearing Restrictions: No      Mobility  Bed Mobility Overal bed mobility: Needs Assistance Bed Mobility: Supine to Sit;Sit to Supine     Supine to sit: Min assist Sit to supine: Min assist   General bed mobility comments: Min A to bring LE's into and out of bed  Transfers Overall transfer level: Needs assistance Equipment used: Rolling walker (2 wheeled) Transfers: Sit to/from [facility] Sit to Stand: Min assist Stand pivot transfers: Min assist       General transfer comment: Min a for safety to stand from EOB and to perform pivot transfer to commode.   Ambulation/Gait             General Gait Details: Did not attempt this session  Stairs            Wheelchair Mobility    Modified Rankin (Stroke Patients Only)       Balance Overall balance assessment: Needs  assistance Sitting-balance support: No upper extremity supported;Feet supported Sitting balance-Leahy Scale: Fair     Standing balance support: Bilateral upper extremity supported Standing balance-Leahy Scale: Poor                               Pertinent Vitals/Pain Pain Assessment: Faces Faces Pain Scale: Hurts even more Pain Location: with urination Pain Descriptors / Indicators: Grimacing;Guarding Pain Intervention(s): Monitored during session;Repositioned    Home Living Family/patient expects to be discharged to:: Assisted living               Home Equipment: Walker - 2 wheels;Bedside commode Additional Comments: Lives at [name]    Prior Function Level of Independence: Needs assistance   Gait / Transfers Assistance Needed: ambulatory with a RW  ADL's / Homemaking Assistance Needed: requries assistance for bathing a dressing  Comments: all information obtained from pt, unsure of accuracy     Hand Dominance   Dominant Hand: Right    Extremity/Trunk Assessment   Upper Extremity Assessment Upper Extremity Assessment: Defer to OT evaluation    Lower Extremity Assessment Lower Extremity Assessment: Generalized weakness    Cervical / Trunk Assessment Cervical / Trunk Assessment: Kyphotic  Communication   Communication: No difficulties  Cognition Arousal/Alertness: Awake/alert Behavior During Therapy: WFL for tasks assessed/performed Overall Cognitive Status: No family/caregiver present to determine baseline cognitive functioning  General Comments General comments (skin integrity, edema, etc.): Pt has blood in her urine with increased pain upon urination. RN getting sample for lab.     Exercises     Assessment/Plan    PT Assessment Patient needs continued PT services  PT Problem List Decreased strength;Decreased activity tolerance;Decreased balance;Decreased  mobility;Decreased knowledge of use of DME       PT Treatment Interventions DME instruction;Gait training;Functional mobility training;Therapeutic activities;Therapeutic exercise;Balance training    PT Goals (Current goals can be found in the Care Plan section)  Acute Rehab PT Goals Patient Stated Goal: to feel better PT Goal Formulation: With patient Time For Goal Achievement: 11/30/16 Potential to Achieve Goals: Good    Frequency Min 2X/week   Barriers to discharge        Co-evaluation               AM-PAC PT "6 Clicks" Daily Activity  Outcome Measure Difficulty turning over in bed (including adjusting bedclothes, sheets and blankets)?: None Difficulty moving from lying on back to sitting on the side of the bed? : Total Difficulty sitting down on and standing up from a chair with arms (e.g., wheelchair, bedside commode, etc,.)?: Total Help needed moving to and from a bed to chair (including a wheelchair)?: Total Help needed walking in [name] room?: Total Help needed climbing 3-5 steps with a railing? : Total 6 Click Score: 9    End of Session Equipment Utilized During Treatment: Gait belt Activity Tolerance: Patient limited by fatigue;Patient limited by pain Patient left: in bed;with call bell/phone within reach;with bed alarm set Nurse Communication: Mobility status PT Visit Diagnosis: Unsteadiness on feet (R26.81);Muscle weakness (generalized) (M62.81);Difficulty in walking, not elsewhere classified (R26.2)    Time: 5003-7048 PT Time Calculation (min) (ACUTE ONLY): 24 min   Charges:   PT Evaluation $PT Eval Moderate Complexity: 1 Procedure PT Treatments $Therapeutic Activity: 8-22 mins   PT G Codes:        [name] PT, DPT  [phone]   [name] 11/16/2016, 1:01 PM

## 2016-11-16 NOTE — Consult Note (Signed)
Subjective: CC: Gross Hematuria  Hx:  [name] is an 83yo WF who I was asked to see in consultation by Dr. Maryland Pink for gross hematuria and a right bladder wall mass.   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   She has no flank pain or voiding complaints.  She had a small non-obstructing LLP stone.  She has no prior GU history.  She is a former smoker but quit 28 years ago.   Her culture grew Mx species.    ROS:  Review of Systems  Genitourinary: Positive for hematuria.  Psychiatric/Behavioral: Positive for memory loss.  All other systems reviewed and are negative.   Allergies  Allergen Reactions  . Macrodantin [Nitrofurantoin]   . Morphine And Related Other (See Comments)    Altered mental state   . Sulfa Antibiotics Other (See Comments)    Reaction: unknown    Past Medical History:  Diagnosis Date  . Anemia, unspecified 03/18/2011  . Anxiety state, unspecified 01/2011  . Blood in stool 06/15/2012  . Corns and callosities 07/01/2011  . Diverticulosis 02/2011  . Dizziness   . DJD (degenerative joint disease)   . Dysuria 06/17/2011  . Hemorrhoids 02/2011  . Low back pain   . Mitral valve problem thickened   thickened  . Osteoarthritis of both hands 10/09/2015  . Osteoporosis 02/2011  . Other abnormal blood chemistry 0/30/2012  . Other acquired deformity of toe 12/16/2011  . Pain in joint, site unspecified 01/2012  . 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 Sacroiliitis, not elsewhere classified ([place]) 05/2011  . Seizures ([place])    Remotely  . Unspecified hypothyroidism 01/2011  . 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 Vitamin D deficiency 01/2011    Past  Surgical History:  Procedure Laterality Date  . bletheroplasty    . CATARACT EXTRACTION W/ INTRAOCULAR LENS  IMPLANT, BILATERAL  2010  . CERVICAL LAMINECTOMY  2005  . HAMMER TOE SURGERY  2013   right 2nd toe Dr. Mallie Mussel  . JOINT REPLACEMENT Bilateral 2002 Right, 1992 Left   knees    Social History   Social History  . Marital status: Widowed    Spouse name: N/A  . Number of children: N/A  . Years of education: N/A   Occupational History  . Not on file.   Social History Main Topics  . Smoking status: Former Smoker    Quit date: 09/14/1988  . Smokeless tobacco: Never Used     Comment: Smoked <1ppd  . Alcohol use 0.6 oz/week    1 Glasses of wine per week     Comment: doesn't drink routinely & only on social occasions  . Drug use: No  . Sexual activity: No   Other Topics Concern  . Not on file   Social History Narrative   Lives at [facility] since 10/07/2010, moved to [place] 01/16/2015   Widowed   Exercise class 3 times a week     Never smoked   Alcholol wine social    POA, Living Will, DNR      [employer] Pulmonary/CC:   Primary MPOA:  Christell Faith (Daughter) - [phone]   Secondary MPOA:  [name] (  Son) - [phone]    Family History  Problem Relation Age of Onset  . Alzheimer's disease Sister   . Emphysema Sister    Past medical, surgical, family and social history reviewed.  Anti-infectives: Anti-infectives    Start     Dose/Rate Route Frequency Ordered Stop   11/16/16 1230  cefTRIAXone (ROCEPHIN) 1 g in dextrose 5 % 50 mL IVPB     1 g 100 mL/hr over 30 Minutes Intravenous Every 24 hours 11/16/16 1158     11/14/16 0400  acyclovir (ZOVIRAX) 500 mg in dextrose 5 % 100 mL IVPB  Status:  Discontinued     500 mg 110 mL/hr over 60 Minutes Intravenous Every 12 hours 11/14/16 0213 11/16/16 0913   11/14/16 0215  ampicillin (OMNIPEN) 2 g in sodium chloride 0.9 % 50 mL IVPB  Status:  Discontinued     2 g 150 mL/hr over 20 Minutes Intravenous Every 8  hours 11/14/16 0207 11/14/16 1213   11/14/16 0110  vancomycin (VANCOCIN) 500 mg in sodium chloride 0.9 % 100 mL IVPB  Status:  Discontinued     500 mg 100 mL/hr over 60 Minutes Intravenous Every 12 hours 11/13/16 2258 11/16/16 1158   11/13/16 2300  vancomycin (VANCOCIN) IVPB 1000 mg/200 mL premix  Status:  Discontinued     1,000 mg 200 mL/hr over 60 Minutes Intravenous  Once 11/13/16 2258 11/16/16 1158   11/13/16 2300  ceFEPIme (MAXIPIME) 2 g in dextrose 5 % 50 mL IVPB  Status:  Discontinued     2 g 100 mL/hr over 30 Minutes Intravenous Every 24 hours 11/13/16 2258 11/16/16 1158      Current Facility-Administered Medications  Medication Dose Route Frequency Provider Last Rate Last Dose  . 0.9 %  sodium chloride infusion   Intravenous Continuous Bonnielee Haff, MD 10 mL/hr at 11/15/16 [phone]    . acetaminophen (TYLENOL) tablet 650 mg  650 mg Oral Q6H PRN Ivor Costa, MD      . calcium-vitamin D (OSCAL WITH D) 500-200 MG-UNIT per tablet 2 tablet  2 tablet Oral Daily Ivor Costa, MD   2 tablet at 11/16/16 1119  . cefTRIAXone (ROCEPHIN) 1 g in dextrose 5 % 50 mL IVPB  1 g Intravenous Q24H Bonnielee Haff, MD      . cholecalciferol (VITAMIN D) tablet 5,000 Units  5,000 Units Oral Daily Ivor Costa, MD   5,000 Units at 11/16/16 1120  . clonazePAM (KLONOPIN) tablet 0.5 mg  0.5 mg Oral BID Ivor Costa, MD   0.5 mg at 11/16/16 0808  . docusate sodium (COLACE) capsule 100 mg  100 mg Oral BID Bonnielee Haff, MD   100 mg at 11/16/16 1120  . donepezil (ARICEPT) tablet 10 mg  10 mg Oral Daily Ivor Costa, MD   10 mg at 11/16/16 1122  . DULoxetine (CYMBALTA) DR capsule 30 mg  30 mg Oral Daily Ivor Costa, MD   30 mg at 11/16/16 1120  . fluticasone furoate-vilanterol (BREO ELLIPTA) 100-25 MCG/INH 1 puff  1 puff Inhalation Daily Ivor Costa, MD   1 puff at 11/16/16 0815  . guaiFENesin (MUCINEX) 12 hr tablet 600 mg  600 mg Oral QHS Ivor Costa, MD   600 mg at 11/14/16 2212  . haloperidol lactate (HALDOL) injection 2  mg  2 mg Intravenous Q6H PRN Bonnielee Haff, MD   2 mg at 11/15/16 1654  . levothyroxine (SYNTHROID, LEVOTHROID) tablet 100 mcg  100 mcg Oral QAC breakfast Ivor Costa, MD   100 mcg  at 11/16/16 0808  . lidocaine (PF) (XYLOCAINE) 1 % injection 10 mL  10 mL Intradermal Once Bonnielee Haff, MD      . memantine [facility]) tablet 10 mg  10 mg Oral BID Ivor Costa, MD   10 mg at 11/16/16 1121  . multivitamin with minerals tablet 1 tablet  1 tablet Oral Daily Ivor Costa, MD   1 tablet at 11/16/16 1119  . MUSCLE RUB CREA 1 application  1 application Topical BID PRN Ivor Costa, MD      . omega-3 acid ethyl esters (LOVAZA) capsule 1 g  1 g Oral Daily Ivor Costa, MD   1 g at 11/16/16 1122  . ondansetron (ZOFRAN) tablet 4 mg  4 mg Oral Q6H PRN Ivor Costa, MD       Or  . ondansetron [facility]) injection 4 mg  4 mg Intravenous Q6H PRN Ivor Costa, MD      . pantoprazole (PROTONIX) EC tablet 40 mg  40 mg Oral Daily Ivor Costa, MD   40 mg at 11/16/16 1123  . phenytoin (DILANTIN) ER capsule 100 mg  100 mg Oral Daily Ivor Costa, MD   100 mg at 11/16/16 1121  . phenytoin (DILANTIN) ER capsule 200 mg  200 mg Oral QHS Ivor Costa, MD   200 mg at 11/14/16 2211  . QUEtiapine (SEROQUEL) tablet 12.5 mg  12.5 mg Oral Q24H Ivor Costa, MD   12.5 mg at 11/15/16 1228  . sodium chloride flush (NS) 0.9 % injection 3 mL  3 mL Intravenous Q12H Ivor Costa, MD   3 mL at 11/16/16 1123  . zolpidem (AMBIEN) tablet 5 mg  5 mg Oral QHS PRN Ivor Costa, MD   5 mg at 11/14/16 2211     Objective: Vital signs in last 24 hours: Temp:  [97.9 F (36.6 C)-98.2 F (36.8 C)] 98.2 F (36.8 C) (07/01 0625) Pulse Rate:  [67-73] 73 (07/01 0625) Resp:  [18] 18 (07/01 0625) BP: (107-140)/(69-82) 107/82 (07/01 0625) SpO2:  [92 %-93 %] 93 % (07/01 0815)  Intake/Output from previous day: 06/30 0701 - 07/01 0700 In: 240 [P.O.:240] Out: 1 [Urine:1] Intake/Output this shift: No intake/output data recorded.   Physical Exam  Constitutional: She is  well-developed, well-nourished, and in no distress.  HENT:  Head: Normocephalic and atraumatic.  Neck: Normal range of motion. Neck supple. No thyromegaly present.  Cardiovascular: Normal rate, regular rhythm and normal heart sounds.   Pulmonary/Chest: Effort normal and breath sounds normal. No respiratory distress.  Abdominal: Soft. Bowel sounds are normal. She exhibits no mass. There is no tenderness.  Musculoskeletal: Normal range of motion. She exhibits no edema.  Lymphadenopathy:    She has no cervical adenopathy.  Neurological: She is alert.  Skin: Skin is warm and dry.  Psychiatric: Mood and affect normal.  Vitals reviewed.   Lab Results:   Recent Labs  11/15/16 0347 11/16/16 1102  WBC 9.3 8.0  HGB 11.3* 12.1  HCT 35.8* 37.6  PLT 127* 159   BMET  Recent Labs  11/13/16 1917 11/15/16 0347  NA 136 142  K 4.0 3.5  CL 106 111  CO2 25 26  GLUCOSE 123* 93  BUN 18 9  CREATININE 0.75 0.60  CALCIUM 8.2* 8.4*   PT/INR  Recent Labs  11/13/16 2229  LABPROT 14.2  INR 1.10   ABG No results for input(s): PHART, HCO3 in the last 72 hours.  Invalid input(s): PCO2, PO2  Studies/Results: Dg Chest 2 View  Result Date:  11/15/2016 CLINICAL DATA:  Cough. EXAM: CHEST  2 VIEW COMPARISON:  11/13/2016, chest CT 03/28/2015 FINDINGS: The cardiac silhouette is stably enlarged. Mediastinal contours appear intact. There is no evidence of focal airspace consolidation, pleural effusion or pneumothorax. Chronic severe interstitial disease, stable in appearance. Osseous structures are without acute abnormality. Soft tissues are grossly normal. IMPRESSION: Severe chronic interstitial lung disease. No definite evidence of superimposed acute airspace consolidation. Mild cardiomegaly. Electronically Signed   By: Fidela Salisbury M.D.   On: 11/15/2016 13:37   Mr Brain Wo Contrast  Result Date: 11/16/2016 CLINICAL DATA:  Dementia.  Seizure disorder.  Acute encephalopathy. EXAM: MRI HEAD  WITHOUT CONTRAST TECHNIQUE: Multiplanar, multiecho pulse sequences of the brain and surrounding structures were obtained without intravenous contrast. COMPARISON:  CT 11/13/2016. FINDINGS: Brain: Diffusion imaging does not show any acute or subacute infarction or other cause of restricted diffusion. There chronic small-vessel ischemic changes of the pons. Cerebellar atrophy without focal insult. Cerebral hemispheres show generalized atrophy with an old left occipital cortical and subcortical infarction and chronic small-vessel ischemic changes of the hemispheric white matter. No mass lesion, hemorrhage, hydrocephalus or extra-axial collection. No pituitary mass. Vascular: Major vessels at the base of the brain show flow. Skull and upper cervical spine: Negative Sinuses/Orbits: Clear/normal Other: None significant IMPRESSION: No acute or reversible finding. Atrophy and chronic small-vessel ischemic changes. 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 Electronically Signed   By: Nelson Chimes M.D.   On: 11/16/2016 07:49   Ct Renal Stone Study  Result Date: 11/16/2016 CLINICAL DATA:  Gross hematuria. EXAM: CT ABDOMEN AND PELVIS WITHOUT CONTRAST TECHNIQUE: Multidetector CT imaging of the abdomen and pelvis was performed following the standard protocol without IV contrast. COMPARISON:  None. FINDINGS: Lower chest: Chronic bibasilar interstitial fibrosis. Small hiatal hernia. Hepatobiliary: No masses visualized on this unenhanced exam. Gallbladder is unremarkable. Pancreas: No mass or inflammatory process visualized on this unenhanced exam. Spleen:  Within normal limits in size. Adrenals/Urinary tract: 3 mm nonobstructing calculus seen in lower pole of left kidney. No evidence of ureteral calculi or hydronephrosis. Irregular high attenuation density measuring approximately 6 cm is seen in the right posterior urinary bladder which likely represents blood clot. Underlying bladder neoplasm cannot be excluded. Stomach/Bowel: No  evidence of obstruction, inflammatory process, or abnormal fluid collections. Colonic diverticulosis, without radiographic evidence of diverticulitis. Vascular/Lymphatic: No pathologically enlarged lymph nodes identified. No evidence of abdominal aortic aneurysm. Aortic atherosclerosis. IVC filter in appropriate position. Reproductive:  No mass or other significant abnormality. Other:  None. Musculoskeletal: No suspicious bone lesions identified. Severe degenerative lumbar spondylosis. IMPRESSION: High attenuation density in urinary bladder, likely representing blood clot. Underlying bladder neoplasm cannot be excluded. Consider cystoscopy for further evaluation. 3 mm nonobstructing left renal calculus. No evidence of ureteral calculi or hydronephrosis. Colonic diverticulosis, without radiographic evidence of diverticulitis. Small hiatal hernia. Aortic Atherosclerosis (ICD10-I70.0). Electronically Signed   By: Earle Gell M.D.   On: 11/16/2016 11:22   I have discussed her case with Dr. Maryland Pink and I have reviewed the CT films and report and discussed them with the family.   Labs and notes reviewed.   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  Bulk of mass is probably clot but there is a probable 2cm tumor.     I am going to transfer her to [facility] for cystoscopy with Bil RTG's, clot evacuation with probable TURBT.   I have reviewed the risks of the procedure with her son including bleeding, infection, bladder wall  injury requiring repair, ureteral injury, thrombotic events and anesthetic complications.      CC: Dr. Bonnielee Haff     Irine Seal J 11/16/2016 [phone]

## 2016-11-17 ENCOUNTER — Encounter (HOSPITAL_COMMUNITY): Payer: Self-pay | Admitting: Urology

## 2016-11-17 DIAGNOSIS — C672 Malignant neoplasm of lateral wall of bladder: Secondary | ICD-10-CM | POA: Diagnosis not present

## 2016-11-17 DIAGNOSIS — R310 Gross hematuria: Secondary | ICD-10-CM | POA: Diagnosis not present

## 2016-11-17 DIAGNOSIS — G40909 Epilepsy, unspecified, not intractable, without status epilepticus: Secondary | ICD-10-CM | POA: Diagnosis not present

## 2016-11-17 DIAGNOSIS — F0151 Vascular dementia with behavioral disturbance: Secondary | ICD-10-CM | POA: Diagnosis not present

## 2016-11-17 DIAGNOSIS — R100 Acute abdomen: Secondary | ICD-10-CM | POA: Diagnosis not present

## 2016-11-17 DIAGNOSIS — G9340 Encephalopathy, unspecified: Secondary | ICD-10-CM | POA: Diagnosis not present

## 2016-11-17 DIAGNOSIS — M545 Low back pain: Secondary | ICD-10-CM | POA: Diagnosis not present

## 2016-11-17 DIAGNOSIS — A419 Sepsis, unspecified organism: Secondary | ICD-10-CM | POA: Diagnosis not present

## 2016-11-17 DIAGNOSIS — E039 Hypothyroidism, unspecified: Secondary | ICD-10-CM | POA: Diagnosis not present

## 2016-11-17 DIAGNOSIS — D494 Neoplasm of unspecified behavior of bladder: Secondary | ICD-10-CM

## 2016-11-17 DIAGNOSIS — R29898 Other symptoms and signs involving the musculoskeletal system: Secondary | ICD-10-CM | POA: Diagnosis not present

## 2016-11-17 DIAGNOSIS — R4182 Altered mental status, unspecified: Secondary | ICD-10-CM | POA: Diagnosis not present

## 2016-11-17 DIAGNOSIS — J309 Allergic rhinitis, unspecified: Secondary | ICD-10-CM | POA: Diagnosis not present

## 2016-11-17 DIAGNOSIS — R1311 Dysphagia, oral phase: Secondary | ICD-10-CM | POA: Diagnosis not present

## 2016-11-17 DIAGNOSIS — N200 Calculus of kidney: Secondary | ICD-10-CM | POA: Diagnosis not present

## 2016-11-17 DIAGNOSIS — R262 Difficulty in walking, not elsewhere classified: Secondary | ICD-10-CM | POA: Diagnosis not present

## 2016-11-17 DIAGNOSIS — K449 Diaphragmatic hernia without obstruction or gangrene: Secondary | ICD-10-CM | POA: Diagnosis not present

## 2016-11-17 DIAGNOSIS — M150 Primary generalized (osteo)arthritis: Secondary | ICD-10-CM | POA: Diagnosis not present

## 2016-11-17 DIAGNOSIS — G9341 Metabolic encephalopathy: Secondary | ICD-10-CM | POA: Diagnosis not present

## 2016-11-17 DIAGNOSIS — M25552 Pain in left hip: Secondary | ICD-10-CM | POA: Diagnosis not present

## 2016-11-17 DIAGNOSIS — J849 Interstitial pulmonary disease, unspecified: Secondary | ICD-10-CM | POA: Diagnosis not present

## 2016-11-17 DIAGNOSIS — M6281 Muscle weakness (generalized): Secondary | ICD-10-CM | POA: Diagnosis not present

## 2016-11-17 DIAGNOSIS — K219 Gastro-esophageal reflux disease without esophagitis: Secondary | ICD-10-CM | POA: Diagnosis not present

## 2016-11-17 DIAGNOSIS — R531 Weakness: Secondary | ICD-10-CM | POA: Diagnosis not present

## 2016-11-17 DIAGNOSIS — R1310 Dysphagia, unspecified: Secondary | ICD-10-CM | POA: Diagnosis not present

## 2016-11-17 DIAGNOSIS — F329 Major depressive disorder, single episode, unspecified: Secondary | ICD-10-CM | POA: Diagnosis not present

## 2016-11-17 DIAGNOSIS — R41841 Cognitive communication deficit: Secondary | ICD-10-CM | POA: Diagnosis not present

## 2016-11-17 DIAGNOSIS — R4789 Other speech disturbances: Secondary | ICD-10-CM | POA: Diagnosis not present

## 2016-11-17 DIAGNOSIS — M1990 Unspecified osteoarthritis, unspecified site: Secondary | ICD-10-CM | POA: Diagnosis not present

## 2016-11-17 DIAGNOSIS — I82402 Acute embolism and thrombosis of unspecified deep veins of left lower extremity: Secondary | ICD-10-CM | POA: Diagnosis not present

## 2016-11-17 DIAGNOSIS — R296 Repeated falls: Secondary | ICD-10-CM | POA: Diagnosis not present

## 2016-11-17 DIAGNOSIS — J9621 Acute and chronic respiratory failure with hypoxia: Secondary | ICD-10-CM

## 2016-11-17 DIAGNOSIS — R2681 Unsteadiness on feet: Secondary | ICD-10-CM | POA: Diagnosis not present

## 2016-11-17 DIAGNOSIS — F411 Generalized anxiety disorder: Secondary | ICD-10-CM | POA: Diagnosis not present

## 2016-11-17 DIAGNOSIS — R05 Cough: Secondary | ICD-10-CM | POA: Diagnosis not present

## 2016-11-17 DIAGNOSIS — N3289 Other specified disorders of bladder: Secondary | ICD-10-CM | POA: Diagnosis not present

## 2016-11-17 DIAGNOSIS — J9611 Chronic respiratory failure with hypoxia: Secondary | ICD-10-CM | POA: Diagnosis not present

## 2016-11-17 DIAGNOSIS — J9601 Acute respiratory failure with hypoxia: Secondary | ICD-10-CM | POA: Diagnosis not present

## 2016-11-17 DIAGNOSIS — R278 Other lack of coordination: Secondary | ICD-10-CM | POA: Diagnosis not present

## 2016-11-17 LAB — BASIC METABOLIC PANEL
ANION GAP: 6 (ref 5–15)
BUN: 10 mg/dL (ref 6–20)
CO2: 31 mmol/L (ref 22–32)
Calcium: 8.5 mg/dL — ABNORMAL LOW (ref 8.9–10.3)
Chloride: 103 mmol/L (ref 101–111)
Creatinine, Ser: 0.53 mg/dL (ref 0.44–1.00)
GFR calc Af Amer: >60 mL/min (ref >60)
GFR calc non Af Amer: >60 mL/min (ref >60)
GLUCOSE: 95 mg/dL (ref 65–99)
POTASSIUM: 3.6 mmol/L (ref 3.5–5.1)
Sodium: 140 mmol/L (ref 135–145)

## 2016-11-17 LAB — CBC
HEMATOCRIT: 36.3 % (ref 36.0–46.0)
Hemoglobin: 11.8 g/dL — ABNORMAL LOW (ref 12.0–15.0)
MCH: 31.2 pg (ref 26.0–34.0)
MCHC: 32.5 g/dL (ref 30.0–36.0)
MCV: 96 fL (ref 78.0–100.0)
Platelets: 174 10*3/uL (ref 150–400)
RBC: 3.78 MIL/uL — AB (ref 3.87–5.11)
RDW: 12.9 % (ref 11.5–15.5)
WBC: 6.6 10*3/uL (ref 4.0–10.5)

## 2016-11-17 LAB — CSF CULTURE W GRAM STAIN: Culture: NO GROWTH

## 2016-11-17 LAB — VDRL, CSF: SYPHILIS VDRL QUANT CSF: NONREACTIVE

## 2016-11-17 LAB — GLUCOSE, CAPILLARY: GLUCOSE-CAPILLARY: 88 mg/dL (ref 65–99)

## 2016-11-17 LAB — CSF CULTURE

## 2016-11-17 MED ORDER — AMOXICILLIN-POT CLAVULANATE 875-125 MG PO TABS: 1.0000 | ORAL_TABLET | Freq: Two times a day (BID) | ORAL | 0 refills | Status: AC

## 2016-11-17 MED ORDER — CLONAZEPAM 0.125 MG PO TBDP: 0.2500 mg | ORAL_TABLET | Freq: Two times a day (BID) | ORAL | Status: DC | PRN

## 2016-11-17 MED ORDER — CLONAZEPAM 0.5 MG PO TABS: ORAL_TABLET | ORAL | 0 refills | Status: DC

## 2016-11-17 NOTE — Progress Notes (Signed)
Physical Therapy Treatment Patient Details Name: [name] MRN: [id number] DOB: [birth year] Today's Date: 11/17/2016    History of Present Illness Pt is an 83 yo female admitted through ED on 11/13/16 with a diagnosis of sepsis, leukocytosis, and acute encephalopathy. PMH significant for GERD, dementia, hypothryoid, depression, anxiety, siezure, DJD, PE.     PT Comments    Assisted OOB to amb to bathroom then in hallway.  Trial amb on RA sats decreased from 91% on 1 liter to 77% RA.  Noted shallow breaths and upper congestion.  SATURATION QUALIFICATIONS: (This note is used to comply with regulatory documentation for home oxygen)  Patient Saturations on Room Air at Rest = 90%  Patient Saturations on Room Air while Ambulating = 77%  Patient Saturations on 2 Liters of oxygen while Ambulating = 90%  Please briefly explain why patient needs home oxygen: during activity pt requires supplenetal oxygen to achieve therapeutic level   Follow Up Recommendations  SNF     Equipment Recommendations  None recommended by PT    Recommendations for Other Services       Precautions / Restrictions Precautions Precautions: Fall Restrictions Weight Bearing Restrictions: No    Mobility  Bed Mobility Overal bed mobility: Needs Assistance Bed Mobility: Supine to Sit     Supine to sit: Supervision;Min guard     General bed mobility comments: repeat cues to complete and stay on task  Transfers Overall transfer level: Needs assistance Equipment used: Rolling walker (2 wheeled) Transfers: Sit to/from [facility] Sit to Stand: Supervision;Min guard Stand pivot transfers: Supervision;Min guard       General transfer comment: repaet functional cueing to complete task and for direction  Ambulation/Gait Ambulation/Gait assistance: Min assist Ambulation Distance (Feet): 25 Feet Assistive device: Rolling walker (2 wheeled) Gait Pattern/deviations: Step-through  pattern;Decreased step length - right;Decreased step length - left Gait velocity: decreased   General Gait Details: pt uses a walker at [facility]F.  25% VC's on safety with turns and negociting around obstacles.  Amb to bathroom then in hallway.  RA dropped to 77% noted pt taking shallow breaths and upper congestion.   Stairs            Wheelchair Mobility    Modified Rankin (Stroke Patients Only)       Balance                                            Cognition Arousal/Alertness: Awake/alert   Overall Cognitive Status: History of cognitive impairments - at baseline                                 General Comments: family stated "improved" but not at prior level      Exercises      General Comments        Pertinent Vitals/Pain Pain Assessment: No/denies pain    Home Living                      Prior Function            PT Goals (current goals can now be found in the care plan section) Progress towards PT goals: Progressing toward goals    Frequency    Min 2X/week      PT Plan  Co-evaluation              AM-PAC PT "6 Clicks" Daily Activity  Outcome Measure  Difficulty turning over in bed (including adjusting bedclothes, sheets and blankets)?: Total Difficulty moving from lying on back to sitting on the side of the bed? : Total Difficulty sitting down on and standing up from a chair with arms (e.g., wheelchair, bedside commode, etc,.)?: Total Help needed moving to and from a bed to chair (including a wheelchair)?: A Lot Help needed walking in hospital room?: A Lot Help needed climbing 3-5 steps with a railing? : Total 6 Click Score: 8    End of Session Equipment Utilized During Treatment: Gait belt Activity Tolerance: Patient limited by fatigue;Patient limited by pain Patient left: in chair;with call bell/phone within reach;with chair alarm set Nurse Communication: Mobility  status PT Visit Diagnosis: Unsteadiness on feet (R26.81);Muscle weakness (generalized) (M62.81);Difficulty in walking, not elsewhere classified (R26.2)     Time: 3220-2542 PT Time Calculation (min) (ACUTE ONLY): 30 min  Charges:  $Gait Training: 8-22 mins $Therapeutic Activity: 8-22 mins                    G Codes:       Rica Koyanagi  PTA [facility]  Acute  Rehab Pager      [phone]

## 2016-11-17 NOTE — Op Note (Signed)
NAMENYANA, HAREN NO.:  [id number]  MEDICAL RECORD NO.:  [id number]  LOCATION:                                 FACILITY:  PHYSICIAN:  Marshall Cork. Jeffie Pollock, M.D.         DATE OF BIRTH:  DATE OF PROCEDURE:  11/16/2016 DATE OF DISCHARGE:                              OPERATIVE REPORT   Patient of Dr. Bonnielee Haff.  PROCEDURE: 1. Cystoscopy with clot evacuation and bilateral retrograde pyelograms     with interpretation. 2. 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  PREOPERATIVE DIAGNOSIS:  Gross hematuria with probable bladder tumor.  POSTOPERATIVE DIAGNOSIS:  Gross hematuria with 3 cm right lateral wall bladder tumor.  SURGEON:  Marshall Cork. Jeffie Pollock, M.D.  ANESTHESIA:  General.  SPECIMEN:  Tumor fragments.  DRAIN:  A 20-French Foley catheter.  BLOOD LOSS:  Minimal.  COMPLICATIONS:  None.  INDICATIONS:  Rebecca [name] is an 83 year old white female, who was admitted on June 28 with sepsis.  She developed gross hematuria, and CT scan was performed which revealed a mass in the right bladder that appeared most consistent with clot, but there was a central area that was concerning for a bladder tumor.  FINDINGS AND PROCEDURE:  She was taken to the operating room where she was given a general anesthetic.  She had received broad-spectrum antibiotics through the medical service.  She was placed in lithotomy position and fitted with PAS hose.  Her perineum and genitalia were prepped with Betadine solution.  She was draped in the usual sterile fashion.  Paralytic was used because of the location of the tumor and her bladder wall thickness on CT.  Cystoscopy was performed using a 23-French scope and 30-degree lens. Examination revealed a normal urethra.  The bladder wall was smooth. There was a considerable clot in the bladder initially which was evacuated with a To[place]y syringe.  There was some necrotic appearing tissue within the clot as well.  I was able to  identify the tumor on the right lateral wall that appeared to be approximately 3 cm and papillary on a narrow base with some active bleeding.  The right ureteral orifice was identified and cannulated.  Contrast was instilled.  Right retrograde pyelogram revealed a normal ureter and intrarenal collecting system.  Left retrograde pyelogram was then performed with a 5-French open-end catheter.  Left retrograde pyelogram revealed a normal ureter and intrarenal collecting system.  I then switched to the 28-French continuous flow resectoscope sheath using the visual obturator and inserted.  This was then fitted with an Beatrix Fetters handle and bipolar loop and a 30-degree lens.  Saline was used as the irrigant.  The tumor was then resected down the bladder wall until muscle could be identified.  Generous muscle bites were not obtained due to the very thin nature of her bladder wall.  However, all visible tumor was resected and the bed was generously fulgurated.  One small perforation into fat approximately 2 mm was noted.  At this point, the tumor chips were evacuated, and a 20-French Foley catheter was inserted.  The balloon was filled with 10 mL of sterile fluid.  The catheter was  placed to straight drainage.  The patient was taken down from lithotomy position.  Her anesthetic was reversed.  She was moved to recovery room in stable condition.  There were no complications.     Marshall Cork. Jeffie Pollock, M.D.   ______________________________ Marshall Cork. Jeffie Pollock, M.D.    JJW/MEDQ  D:  11/16/2016  T:  11/16/2016  Job:  [id number]

## 2016-11-17 NOTE — Care Management Important Message (Signed)
Important Message  Patient Details  Name: CYNDY BRAVER MRN: [id number] Date of Birth: 07/02/1927   Medicare Important Message Given:  Yes    Kerin Salen 11/17/2016, 12:35 [place] Message  Patient Details  Name: JEZEBEL POLLET MRN: [id number] Date of Birth: Nov 19, 1927   Medicare Important Message Given:  Yes    Kerin Salen 11/17/2016, 12:35 PM

## 2016-11-17 NOTE — Clinical Social Work Note (Signed)
Clinical Social Work Assessment  Patient Details  Name: Rebecca [name] MRN: [id number] Date of Birth: Sep 26, 1927  Date of referral:  11/17/16               Reason for consult:  Facility Placement                Permission sought to share information with:  Chartered certified accountant granted to share information::  Yes, Verbal Permission Granted  Name::        Agency::  [facility]  Relationship::     Contact Information:     Housing/Transportation Living arrangements for the past 2 months:  [place] of Information:  Adult Children Patient Interpreter Needed:  None Criminal Activity/Legal Involvement Pertinent to Current Situation/Hospitalization:  No - Comment as needed Significant Relationships:  Adult Children Lives with:  Facility Resident Do you feel safe going back to the place where you live?  Yes Need for family participation in patient care:  Yes (Comment)  Care giving concerns:  Patient from [facility]. PT recommended SNF for ST rehab prior to returning to ALF.   Social Worker assessment / plan:  CSW spoke with patient/patient's children at bedside. Patient's son Baylen Dea) reported that patient has been at ALF for 2 years and that she misses her independence. CSW and patient's children discussed PT recommendation for ST rehab at [name]. Patient's son reported that patient has been to SNF for ST rehab in the past. Patient/patient's children agreeable to [place] rehab at [facility] and prefer [place] contacted [facility] and confirmed patient's ability to go to their SNF. CSW completed FL2 and will assist with discharge planning to SNF.  Employment status:  Retired Forensic scientist:  Medicare PT Recommendations:  [place] / Referral to community resources:  [place]  Patient/Family's Response to care:  Patient/patient's children agreeable to SNF for ST  rehab.  Patient/Family's Understanding of and Emotional Response to Diagnosis, Current Treatment, and Prognosis:  Patient presented lethargic during the assessment, reporting that she is so sleepy. Patient's children active in patient's care and verbalized understanding of diagnosis and plan for ST rehab at SNF. Patient's children optimistic about patient making progress in SNF and returning to ALF portion of [facility].  Emotional Assessment Appearance:  Appears stated age Attitude/Demeanor/Rapport:  Lethargic Affect (typically observed):    Orientation:  Oriented to Self Alcohol / Substance use:  Not Applicable Psych involvement (Current and /or in the community):  No (Comment)  Discharge Needs  Concerns to be addressed:  No discharge needs identified Readmission within the last 30 days:  No Current discharge risk:  None Barriers to Discharge:  No Barriers Identified   Burnis Medin, LCSW 11/17/2016, 5:23 PM

## 2016-11-17 NOTE — Progress Notes (Signed)
1 Day Post-Op  Subjective: She is doing well post TURBT for a right lateral wall bladder tumor with hematuria.  Her foley came out when she tried to get out of bed this morning but she has had no bleeding.  She has not voided yet.   She slept well through the night.  Hgb is minimally decreased to 11.8.  ROS:  Review of Systems  Unable to perform ROS: Dementia    Anti-infectives: Anti-infectives    Start     Dose/Rate Route Frequency Ordered Stop   11/16/16 1230  cefTRIAXone (ROCEPHIN) 1 g in dextrose 5 % 50 mL IVPB     1 g 100 mL/hr over 30 Minutes Intravenous Every 24 hours 11/16/16 1158     11/14/16 0400  acyclovir (ZOVIRAX) 500 mg in dextrose 5 % 100 mL IVPB  Status:  Discontinued     500 mg 110 mL/hr over 60 Minutes Intravenous Every 12 hours 11/14/16 0213 11/16/16 0913   11/14/16 0215  ampicillin (OMNIPEN) 2 g in sodium chloride 0.9 % 50 mL IVPB  Status:  Discontinued     2 g 150 mL/hr over 20 Minutes Intravenous Every 8 hours 11/14/16 0207 11/14/16 1213   11/14/16 0110  vancomycin (VANCOCIN) 500 mg in sodium chloride 0.9 % 100 mL IVPB  Status:  Discontinued     500 mg 100 mL/hr over 60 Minutes Intravenous Every 12 hours 11/13/16 2258 11/16/16 1158   11/13/16 2300  vancomycin (VANCOCIN) IVPB 1000 mg/200 mL premix  Status:  Discontinued     1,000 mg 200 mL/hr over 60 Minutes Intravenous  Once 11/13/16 2258 11/16/16 1158   11/13/16 2300  ceFEPIme (MAXIPIME) 2 g in dextrose 5 % 50 mL IVPB  Status:  Discontinued     2 g 100 mL/hr over 30 Minutes Intravenous Every 24 hours 11/13/16 2258 11/16/16 1158      Current Facility-Administered Medications  Medication Dose Route Frequency Provider Last Rate Last Dose  . 0.9 %  sodium chloride infusion   Intravenous Continuous Bonnielee Haff, MD 10 mL/hr at 11/16/16 2117    . acetaminophen (TYLENOL) tablet 650 mg  650 mg Oral Q6H PRN Ivor Costa, MD      . calcium-vitamin D (OSCAL WITH D) 500-200 MG-UNIT per tablet 2 tablet  2 tablet Oral  Daily Ivor Costa, MD   2 tablet at 11/16/16 1119  . cefTRIAXone (ROCEPHIN) 1 g in dextrose 5 % 50 mL IVPB  1 g Intravenous Q24H Bonnielee Haff, MD 100 mL/hr at 11/16/16 1418 1 g at 11/16/16 1418  . cholecalciferol (VITAMIN D) tablet 5,000 Units  5,000 Units Oral Daily Ivor Costa, MD   5,000 Units at 11/16/16 1120  . clonazePAM (KLONOPIN) tablet 0.5 mg  0.5 mg Oral BID Ivor Costa, MD   0.5 mg at 11/16/16 2115  . docusate sodium (COLACE) capsule 100 mg  100 mg Oral BID Bonnielee Haff, MD   100 mg at 11/16/16 2115  . donepezil (ARICEPT) tablet 10 mg  10 mg Oral Daily Ivor Costa, MD   10 mg at 11/16/16 1122  . DULoxetine (CYMBALTA) DR capsule 30 mg  30 mg Oral Daily Ivor Costa, MD   30 mg at 11/16/16 1120  . fluticasone furoate-vilanterol (BREO ELLIPTA) 100-25 MCG/INH 1 puff  1 puff Inhalation Daily Ivor Costa, MD   1 puff at 11/16/16 0815  . guaiFENesin (MUCINEX) 12 hr tablet 600 mg  600 mg Oral QHS Ivor Costa, MD   600 mg at 11/16/16  2115  . haloperidol lactate (HALDOL) injection 2 mg  2 mg Intravenous Q6H PRN Bonnielee Haff, MD   2 mg at 11/15/16 1654  . levothyroxine (SYNTHROID, LEVOTHROID) tablet 100 mcg  100 mcg Oral QAC breakfast Ivor Costa, MD   100 mcg at 11/16/16 4098  . lidocaine (PF) (XYLOCAINE) 1 % injection 10 mL  10 mL Intradermal Once Bonnielee Haff, MD      . memantine [facility]) tablet 10 mg  10 mg Oral BID Ivor Costa, MD   10 mg at 11/16/16 2115  . multivitamin with minerals tablet 1 tablet  1 tablet Oral Daily Ivor Costa, MD   1 tablet at 11/16/16 1119  . MUSCLE RUB CREA 1 application  1 application Topical BID PRN Ivor Costa, MD      . omega-3 acid ethyl esters (LOVAZA) capsule 1 g  1 g Oral Daily Ivor Costa, MD   1 g at 11/16/16 1122  . ondansetron (ZOFRAN) tablet 4 mg  4 mg Oral Q6H PRN Ivor Costa, MD       Or  . ondansetron [facility]) injection 4 mg  4 mg Intravenous Q6H PRN Ivor Costa, MD      . pantoprazole (PROTONIX) EC tablet 40 mg  40 mg Oral Daily Ivor Costa, MD   40 mg  at 11/16/16 1123  . phenytoin (DILANTIN) ER capsule 100 mg  100 mg Oral Daily Ivor Costa, MD   100 mg at 11/16/16 1121  . phenytoin (DILANTIN) ER capsule 200 mg  200 mg Oral QHS Ivor Costa, MD   200 mg at 11/16/16 2116  . QUEtiapine (SEROQUEL) tablet 12.5 mg  12.5 mg Oral Q24H Ivor Costa, MD   12.5 mg at 11/16/16 1418  . sodium chloride flush (NS) 0.9 % injection 3 mL  3 mL Intravenous Q12H Ivor Costa, MD   3 mL at 11/16/16 2200  . traMADol-acetaminophen (ULTRACET) 37.5-325 MG per tablet 1 tablet  1 tablet Oral Q6H PRN Irine Seal, MD      . zolpidem [facility]) tablet 5 mg  5 mg Oral QHS PRN Ivor Costa, MD   5 mg at 11/14/16 2211     Objective: Vital signs in last 24 hours: Temp:  [97.8 F (36.6 C)-98.4 F (36.9 C)] 98 F (36.7 C) (07/02 0500) Pulse Rate:  [50-74] 67 (07/02 0500) Resp:  [16-18] 16 (07/02 0500) BP: (123-151)/(65-101) 132/65 (07/02 0500) SpO2:  [93 %-100 %] 97 % (07/02 0500)  Intake/Output from previous day: 07/01 0701 - 07/02 0700 In: 1850.8 [I.V.:1800.8; IV Piggyback:50] Out: 635 [Urine:625; Blood:10] Intake/Output this shift: No intake/output data recorded.   Physical Exam  Constitutional: She is well-developed, well-nourished, and in no distress.  Vitals reviewed.   Lab Results:   Recent Labs  11/16/16 1102 11/17/16 0454  WBC 8.0 6.6  HGB 12.1 11.8*  HCT 37.6 36.3  PLT 159 174   BMET  Recent Labs  11/15/16 0347 11/17/16 0454  NA 142 140  K 3.5 3.6  CL 111 103  CO2 26 31  GLUCOSE 93 95  BUN 9 10  CREATININE 0.60 0.53  CALCIUM 8.4* 8.5*   PT/INR No results for input(s): LABPROT, INR in the last 72 hours. ABG No results for input(s): PHART, HCO3 in the last 72 hours.  Invalid input(s): PCO2, PO2  Studies/Results: Dg Chest 2 View  Result Date: 11/15/2016 CLINICAL DATA:  Cough. EXAM: CHEST  2 VIEW COMPARISON:  11/13/2016, chest CT 03/28/2015 FINDINGS: The cardiac silhouette is stably enlarged. Mediastinal  contours appear intact. There  is no evidence of focal airspace consolidation, pleural effusion or pneumothorax. Chronic severe interstitial disease, stable in appearance. Osseous structures are without acute abnormality. Soft tissues are grossly normal. IMPRESSION: Severe chronic interstitial lung disease. No definite evidence of superimposed acute airspace consolidation. Mild cardiomegaly. Electronically Signed   By: Fidela Salisbury M.D.   On: 11/15/2016 13:37   Mr Brain Wo Contrast  Result Date: 11/16/2016 CLINICAL DATA:  Dementia.  Seizure disorder.  Acute encephalopathy. EXAM: MRI HEAD WITHOUT CONTRAST TECHNIQUE: Multiplanar, multiecho pulse sequences of the brain and surrounding structures were obtained without intravenous contrast. COMPARISON:  CT 11/13/2016. FINDINGS: Brain: Diffusion imaging does not show any acute or subacute infarction or other cause of restricted diffusion. There chronic small-vessel ischemic changes of the pons. Cerebellar atrophy without focal insult. Cerebral hemispheres show generalized atrophy with an old left occipital cortical and subcortical infarction and chronic small-vessel ischemic changes of the hemispheric white matter. No mass lesion, hemorrhage, hydrocephalus or extra-axial collection. No pituitary mass. Vascular: Major vessels at the base of the brain show flow. Skull and upper cervical spine: Negative Sinuses/Orbits: Clear/normal Other: None significant IMPRESSION: No acute or reversible finding. Atrophy and chronic small-vessel ischemic changes. 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 Electronically Signed   By: Nelson Chimes M.D.   On: 11/16/2016 07:49   Dg C-arm 1-60 Min-no Report  Result Date: 11/16/2016 Fluoroscopy was utilized by the requesting physician.  No radiographic interpretation.   Ct Renal Stone Study  Result Date: 11/16/2016 CLINICAL DATA:  Gross hematuria. EXAM: CT ABDOMEN AND PELVIS WITHOUT CONTRAST TECHNIQUE: Multidetector CT imaging of the abdomen and pelvis was  performed following the standard protocol without IV contrast. COMPARISON:  None. FINDINGS: Lower chest: Chronic bibasilar interstitial fibrosis. Small hiatal hernia. Hepatobiliary: No masses visualized on this unenhanced exam. Gallbladder is unremarkable. Pancreas: No mass or inflammatory process visualized on this unenhanced exam. Spleen:  Within normal limits in size. Adrenals/Urinary tract: 3 mm nonobstructing calculus seen in lower pole of left kidney. No evidence of ureteral calculi or hydronephrosis. Irregular high attenuation density measuring approximately 6 cm is seen in the right posterior urinary bladder which likely represents blood clot. Underlying bladder neoplasm cannot be excluded. Stomach/Bowel: No evidence of obstruction, inflammatory process, or abnormal fluid collections. Colonic diverticulosis, without radiographic evidence of diverticulitis. Vascular/Lymphatic: No pathologically enlarged lymph nodes identified. No evidence of abdominal aortic aneurysm. Aortic atherosclerosis. IVC filter in appropriate position. Reproductive:  No mass or other significant abnormality. Other:  None. Musculoskeletal: No suspicious bone lesions identified. Severe degenerative lumbar spondylosis. IMPRESSION: High attenuation density in urinary bladder, likely representing blood clot. Underlying bladder neoplasm cannot be excluded. Consider cystoscopy for further evaluation. 3 mm nonobstructing left renal calculus. No evidence of ureteral calculi or hydronephrosis. Colonic diverticulosis, without radiographic evidence of diverticulitis. Small hiatal hernia. Aortic Atherosclerosis (ICD10-I70.0). Electronically Signed   By: Earle Gell M.D.   On: 11/16/2016 11:22     Assessment and Plan: Bladder cancer with hematuria doing well post TURBT.   Her foley is out but she hasn't voided yet.   If she voids and doesn't have significant hematuria she is ok for discharge from a GU standpoint.   I will arrange f/u.        LOS: 4 days    Malka So 11/17/2016 [phone] ID: [name], female   DOB: 1927/12/17, 83 y.o.   MRN: [id number]

## 2016-11-17 NOTE — Progress Notes (Signed)
Patient's bed alarm began alarming this AM. Upon arrival to patient's room, pt was attempting to get OOB unassisted. Pt is confused and says she "needs to get to school." Pt reoriented but argumentative and looking for her clothes while taking her gown off. Pt's foley catheter was pulled out with balloon intact while patient was attempting to get OOB. Pt not complaining of any pain at this time. Urology on-call MD Pilar Jarvis paged- awaiting response. Pt currently back in bed, resting calmly.

## 2016-11-17 NOTE — Discharge Summary (Signed)
Discharge Summary  Rebecca [name] ZWC:[id number] DOB: 05/23/1927  PCP: [name], MD  Admit date: 11/13/2016 Discharge date: 11/17/2016  Time spent:> 20mins, more than 50% time spent on coordination of care and patient/family counseling.   Recommendations for Outpatient Follow-up:  1. F/u with SNF MD within  for hospital discharge follow up, repeat cbc/bmp at follow up 2. Consider referral to pulmonology for interstitial lung disease and chronic hypoxia, she is started on home oxygen 3. F/u with urology Dr Jeffie Pollock  Discharge Diagnoses:  Active Hospital Problems   Diagnosis Date Noted  . Sepsis ([place]) 03/11/2015  . Gross hematuria 11/16/2016  . Acute metabolic encephalopathy 23/53/6144  . GERD (gastroesophageal reflux disease) 05/20/2016  . Chronic pulmonary embolism ([place]) 03/28/2015  . Seizure disorder ([place]) 03/11/2015  . Dementia 09/19/2012  . Hypothyroidism 12/08/2006    Resolved Hospital Problems   Diagnosis Date Noted Date Resolved  No resolved problems to display.    Discharge Condition: stable  Diet recommendation: dysphagia diet III/thin liquid, aspiration precaution  Filed Weights   11/13/16 1852  Weight: 64 kg (141 lb)    History of present illness:  PCP: [name], MD   Patient coming from:  The patient is coming from SNF.  At baseline, pt is dependent for most of ADL.   Chief Complaint: fever and AMS  HPI: Rebecca [name] is a 83 y.o. 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  Per patient's son, patient becomes confused today. She has dementia, but mental status is worse than her baseline. Patient does not have unilateral weakness, numbness or tingling sensations in extremities. No vision change or hearing loss. No slurred speech or facial droop.  Patient has fever and chills. No chest pain, SOB, nausea, vomiting, cough, abdominal pain, diarrhea, symptoms of UTI. No neck pain or photophobia.  ED Course: pt was found to have WBC 11.3, lactic acid 0.99, negative urinalysis, temperature 101.3, no tachycardia, section 90-95% on room air. Chest x-ray showed chronic interstitial changes, without infiltration. CT head is negative for acute intracranial abnormalities.  Hospital Course:  Principal Problem:   Sepsis ([place]) Active Problems:   Hypothyroidism   Dementia   Seizure disorder ([place])   Chronic pulmonary embolism ([facility])   GERD (gastroesophageal reflux disease)   Acute metabolic encephalopathy   Gross hematuria   Sepsis of unknown etiology Patient presented with leukocytosis, fever, or tachypnea. Lactic acid normal. Pro-calcitonin level less than 0.1. UA did not suggest any infection. Chest x-ray showed chronic interstitial changes. Due to her altered mental status, concern was for meningitis. Patient underwent lumbar puncture by interventional radiology. CSF WBC cell count was only 1. Gram stain does not show any organism. Cultures are negative so far. Respiratory viral panel also negative. Chest x-ray was repeated and once again does not show any infiltrates. Shows chronic interstitial changes as seen before. HSV PCR is negative.  Culture unrevealing, she is started with vanc / cefepime/acyclovir empirically, these was stopped after negative LP and cultures unrevealing abx changed to ceftriaxone due to her urological issues.  She could've also had a viral syndrome. Could have pneumonia ( she has a chronic cough) She has improved and has been afebrile. She is discharged on augmentin for three more days to finish total of 7days abx treatment.  Acute encephalopathy. This is in the setting of known dementia.  CT head did not show any acute findings. Lumbar puncture did not suggest meningitis. Altered mental status could be due to fever and  infection. No obvious focal neurological deficits. MRI brain was done which did not show any acute findings. EEG did not show any epileptiform activity.  -Mental status has improved, now close to baseline, not oriented to time, but to person and place, she is very pleasant, .  Gross hematuria Nursing staff reported blood in the urine this morning. No previous history of same. Previous UAs reviewed, and she's had some hemoglobin/BC's in the urine noted last year. She's never had significant amount of blood in the urine. She's never had any urological issues.  CT scan showed. A nonobstructing kidney stone was seen. However, more concerning was the density was noted in the urinary bladder, which could be a hematoma or tumor or both. Urology consulted. patient is transferred from  to [place] and underwent: 1. Cystoscopy with clot evacuation and bilateral retrograde pyelograms     with interpretation. 2. 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 Pathology pending, she is to follow up with urology in 1-2 weeks. She voided spontaneously prior to discharge, no gross hematuria prior to discharge, hgb stable.   Acute on chronic hypoxia respiratory failure:  "Patient Saturations on Room Air at Rest = 90%  Patient Saturations on Room Air while Ambulating = 77% Patient Saturations on 2 Liters of oxygen while Ambulating = 90%"  cxr with chronic interstitial changes, she does has bibasilar crackles on exam, she report chronic cough I have reviewed echo done in 2016 which showed normal lvef and grade 1 diastolic dysfunction, she is euvolemic on exam  No tachypnea, no accessory muscle use, she denies sob, I suspect patient has a long standing hypoxia, she is started on home oxygen, refer to pulmonology per pmd 's discretion.    History of dementia. Continue home medication.  History of seizure disorder. Continue Dilantin. EEG as mentioned above.  History of  hypothyroidism. TSH normal. Continue home medications.  History of depression and anxiety. Stable. Continue home medications.   Code Status: DO NOT RESUSCITATE  Family Communication: Discussed with patient's daughter at bedside Disposition Plan: SNF   Discharge Exam: BP 113/71 (BP Location: Right Arm)   Pulse 68   Temp 98.9 F (37.2 C) (Oral)   Resp 16   Ht 5' (1.524 m)   Wt 64 kg (141 lb)   SpO2 97%   BMI 27.54 kg/m   General: frail elderly female sitting in chair, NAD, pleasantly demented Cardiovascular: RRR Respiratory: bibasilar crackles Ab: soft, NT, +bs Extremities: no edema  Discharge Instructions You were cared for by a hospitalist during your hospital stay. If you have any questions about your discharge medications or the care you received while you were in the hospital after you are discharged, you can call the unit and asked to speak with the hospitalist on call if the hospitalist that took care of you is not available. Once you are discharged, your primary care physician will handle any further medical issues. Please note that NO REFILLS for any discharge medications will be authorized once you are discharged, as it is imperative that you return to your primary care physician (or establish a relationship with a primary care physician if you do not have one) for your aftercare needs so that they can reassess your need for medications and monitor your lab values.  Discharge Instructions    Diet general    Complete  by:  As directed    Dysphagia III diet, thin liquid, aspiration precaution.   For home use only DME oxygen    Complete by:  As directed    Mode or (Route):  Nasal cannula   Liters per Minute:  2   Frequency:  Continuous (stationary and portable oxygen unit needed)   Oxygen conserving device:  Yes   Oxygen delivery system:  Gas   Increase activity slowly    Complete by:  As directed      Allergies as of 11/17/2016      Reactions   Macrodantin  [nitrofurantoin]    Morphine And Related Other (See Comments)   Altered mental state    Sulfa Antibiotics Other (See Comments)   Reaction: unknown      Medication List    TAKE these medications   acetaminophen 325 MG tablet Commonly known as:  TYLENOL Take 650 mg by mouth 2 (two) times daily. Take 2 tablets twice daily   amoxicillin 500 MG capsule Commonly known as:  AMOXIL Take 2,000 mg by mouth as directed. 1 Hour Before Procedure.   amoxicillin-clavulanate 875-125 MG tablet Commonly known as:  AUGMENTIN Take 1 tablet by mouth 2 (two) times daily.   BREO ELLIPTA 100-25 MCG/INH Aepb Generic drug:  fluticasone furoate-vilanterol Inhale 1 puff into the lungs daily.   CALCIUM 600+D 600-400 MG-UNIT tablet Generic drug:  Calcium Carbonate-Vitamin D Take 1 tablet by mouth daily.   clonazePAM 0.5 MG tablet Commonly known as:  KLONOPIN Take one tablet by mouth 2 times a day at 8 am and 8 pm.   donepezil 10 MG tablet Commonly known as:  ARICEPT Take 10 mg by mouth daily.   DULoxetine 30 MG capsule Commonly known as:  CYMBALTA Take 30 mg by mouth. Take one tablet daily   levothyroxine 100 MCG tablet Commonly known as:  SYNTHROID, LEVOTHROID Take 1 tablet (100 mcg total) by mouth daily before breakfast. For thyroid   loratadine 10 MG tablet Commonly known as:  CLARITIN Take 10 mg by mouth daily. For 2 weeks   meloxicam 7.5 MG tablet Commonly known as:  MOBIC Take 7.5 mg by mouth daily as needed for pain.   memantine 10 MG tablet Commonly known as:  NAMENDA Take 10 mg by mouth 2 (two) times daily.   Omega-3 1000 MG Caps Take 1 capsule by mouth daily. Take one capsule every morning   omeprazole 20 MG capsule Commonly known as:  PRILOSEC Take 20 mg by mouth at bedtime.   phenytoin 100 MG ER capsule Commonly known as:  DILANTIN Take 100-200 mg by mouth See admin instructions. Take one capsule every morning; take 2 capsules at bedtime   QUEtiapine 25 MG  tablet Commonly known as:  SEROQUEL Take 12.5 mg by mouth daily. At 1 pm   THERA M PLUS PO Take 1 tablet by mouth daily.   triamcinolone cream 0.1 % Commonly known as:  KENALOG Apply 1 application topically daily as needed (for rash).   trolamine salicylate 10 % cream Commonly known as:  ASPERCREME Apply 1 application topically. Apply as needed   Vitamin D3 5000 units Tabs Take 1 tablet by mouth daily. Take one tablet daily            Durable Medical Equipment        Start     Ordered   11/17/16 0000  For home use only DME oxygen    Question Answer Comment  Mode or (Route) Nasal  cannula   Liters per Minute 2   Frequency Continuous (stationary and portable oxygen unit needed)   Oxygen conserving device Yes   Oxygen delivery system Gas      11/17/16 1423     Allergies  Allergen Reactions  . Macrodantin [Nitrofurantoin]   . Morphine And Related Other (See Comments)    Altered mental state   . Sulfa Antibiotics Other (See Comments)    Reaction: unknown   Follow-up Information    Irine Seal, MD Follow up.   Specialty:  Urology Why:  Please call for f/u appointment for 1-2 weeks post discharge if you have not been contacted by my office. Contact information: [place] [address] [phone]        [facility] Follow up in 3 week(s).   Specialty:  Pulmonology Why:  hypoxia, interstitial lung disease Contact information: [address] [place]       [name], MD Follow up.   Specialty:  Internal Medicine Why:  hospital discharge follow up, referrla to patient to pulmonology for interstitial lung disease and hypoxic respiratory failure per pmd 's discretion.  Contact information: [place] [address] [phone]            The results of significant diagnostics from this hospitalization (including imaging, microbiology, ancillary and laboratory) are  listed below for reference.    Significant Diagnostic Studies: Dg Chest 2 View  Result Date: 11/15/2016 CLINICAL DATA:  Cough. EXAM: CHEST  2 VIEW COMPARISON:  11/13/2016, chest CT 03/28/2015 FINDINGS: The cardiac silhouette is stably enlarged. Mediastinal contours appear intact. There is no evidence of focal airspace consolidation, pleural effusion or pneumothorax. Chronic severe interstitial disease, stable in appearance. Osseous structures are without acute abnormality. Soft tissues are grossly normal. IMPRESSION: Severe chronic interstitial lung disease. No definite evidence of superimposed acute airspace consolidation. Mild cardiomegaly. Electronically Signed   By: Fidela Salisbury M.D.   On: 11/15/2016 13:37   Dg Chest 2 View  Result Date: 11/13/2016 CLINICAL DATA:  Fever and decreased mental status EXAM: CHEST  2 VIEW COMPARISON:  04/02/2015.  03/28/2015. FINDINGS: AP and lateral views of the chest show enlargement of the cardiopericardial silhouette. There is diffuse underlying chronic interstitial lung disease, as before. No definite superimposed pulmonary edema or focal pneumonia. No substantial pleural effusion. The visualized bony structures of the thorax are intact. Telemetry leads overlie the chest. IMPRESSION: Chronic underlying interstitial lung disease, similar to prior studies. No definite superimposed focal pneumonia or pulmonary edema at this time. Electronically Signed   By: Misty Stanley M.D.   On: 11/13/2016 20:09   Ct Head Wo Contrast  Result Date: 11/13/2016 CLINICAL DATA:  83 year old female with altered mental status and fever. EXAM: CT HEAD WITHOUT CONTRAST TECHNIQUE: Contiguous axial images were obtained from the base of the skull through the vertex without intravenous contrast. COMPARISON:  03/30/2016 and prior exams FINDINGS: Brain: No evidence of acute infarction, hemorrhage, hydrocephalus, extra-axial collection or mass lesion/mass effect. Atrophy, chronic  small-vessel white matter ischemic changes and remote left occipital infarct again noted. Vascular: Intracranial atherosclerotic calcifications noted. Skull: Normal. Negative for fracture or focal lesion. Sinuses/Orbits: No acute finding. Other: None. IMPRESSION: No at evidence of acute intracranial abnormality. Atrophy, chronic small-vessel white matter ischemic changes and remote left occipital infarct. Electronically Signed   By: Margarette Canada M.D.   On: 11/13/2016 22:47   Mr Brain Wo Contrast  Result Date: 11/16/2016 CLINICAL  DATA:  Dementia.  Seizure disorder.  Acute encephalopathy. EXAM: MRI HEAD WITHOUT CONTRAST TECHNIQUE: Multiplanar, multiecho pulse sequences of the brain and surrounding structures were obtained without intravenous contrast. COMPARISON:  CT 11/13/2016. FINDINGS: Brain: Diffusion imaging does not show any acute or subacute infarction or other cause of restricted diffusion. There chronic small-vessel ischemic changes of the pons. Cerebellar atrophy without focal insult. Cerebral hemispheres show generalized atrophy with an old left occipital cortical and subcortical infarction and chronic small-vessel ischemic changes of the hemispheric white matter. No mass lesion, hemorrhage, hydrocephalus or extra-axial collection. No pituitary mass. Vascular: Major vessels at the base of the brain show flow. Skull and upper cervical spine: Negative Sinuses/Orbits: Clear/normal Other: None significant IMPRESSION: No acute or reversible finding. Atrophy and chronic small-vessel ischemic changes. 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 Electronically Signed   By: Nelson Chimes M.D.   On: 11/16/2016 07:49   Dg C-arm 1-60 Min-no Report  Result Date: 11/16/2016 Fluoroscopy was utilized by the requesting physician.  No radiographic interpretation.   Ct Renal Stone Study  Result Date: 11/16/2016 CLINICAL DATA:  Gross hematuria. EXAM: CT ABDOMEN AND PELVIS WITHOUT CONTRAST TECHNIQUE: Multidetector CT imaging  of the abdomen and pelvis was performed following the standard protocol without IV contrast. COMPARISON:  None. FINDINGS: Lower chest: Chronic bibasilar interstitial fibrosis. Small hiatal hernia. Hepatobiliary: No masses visualized on this unenhanced exam. Gallbladder is unremarkable. Pancreas: No mass or inflammatory process visualized on this unenhanced exam. Spleen:  Within normal limits in size. Adrenals/Urinary tract: 3 mm nonobstructing calculus seen in lower pole of left kidney. No evidence of ureteral calculi or hydronephrosis. Irregular high attenuation density measuring approximately 6 cm is seen in the right posterior urinary bladder which likely represents blood clot. Underlying bladder neoplasm cannot be excluded. Stomach/Bowel: No evidence of obstruction, inflammatory process, or abnormal fluid collections. Colonic diverticulosis, without radiographic evidence of diverticulitis. Vascular/Lymphatic: No pathologically enlarged lymph nodes identified. No evidence of abdominal aortic aneurysm. Aortic atherosclerosis. IVC filter in appropriate position. Reproductive:  No mass or other significant abnormality. Other:  None. Musculoskeletal: No suspicious bone lesions identified. Severe degenerative lumbar spondylosis. IMPRESSION: High attenuation density in urinary bladder, likely representing blood clot. Underlying bladder neoplasm cannot be excluded. Consider cystoscopy for further evaluation. 3 mm nonobstructing left renal calculus. No evidence of ureteral calculi or hydronephrosis. Colonic diverticulosis, without radiographic evidence of diverticulitis. Small hiatal hernia. Aortic Atherosclerosis (ICD10-I70.0). Electronically Signed   By: Earle Gell M.D.   On: 11/16/2016 11:22   Dg Fluoro Guide Lumbar Puncture  Result Date: 11/14/2016 CLINICAL DATA:  Fever of unknown origin.  Altered mental status. EXAM: DIAGNOSTIC LUMBAR PUNCTURE UNDER FLUOROSCOPIC GUIDANCE FLUOROSCOPY TIME:  Fluoroscopy Time:  1  minutes PROCEDURE: Informed consent was obtained from the patient's son by phone prior to the procedure, including potential complications of headache, allergy, and pain. With the patient prone, the lower back was prepped with Betadine. 1% Lidocaine was used for local anesthesia. Lumbar puncture was performed at the L3-4 level using a 21 gauge needle with return of clear colorless CSF. 20 ml of CSF were obtained for laboratory studies. The patient tolerated the procedure well and there were no apparent complications. IMPRESSION: Lumbar puncture performed without complication. Laboratory results to follow. Electronically Signed   By: Lorriane Shire M.D.   On: 11/14/2016 10:08    Microbiology: Recent Results (from the past 240 hour(s))  Blood culture (routine x 2)     Status: None (Preliminary result)   Collection Time: 11/13/16  7:17 PM  Result Value Ref Range Status   Specimen Description BLOOD RIGHT FOREARM  Final   Special Requests   Final    BOTTLES DRAWN AEROBIC AND ANAEROBIC Blood Culture adequate volume   Culture NO GROWTH 4 DAYS  Final   Report Status PENDING  Incomplete  Blood culture (routine x 2)     Status: None (Preliminary result)   Collection Time: 11/13/16  7:20 PM  Result Value Ref Range Status   Specimen Description BLOOD RIGHT HAND  Final   Special Requests   Final    BOTTLES DRAWN AEROBIC AND ANAEROBIC Blood Culture adequate volume   Culture NO GROWTH 4 DAYS  Final   Report Status PENDING  Incomplete  Urine Culture     Status: Abnormal   Collection Time: 11/13/16  7:46 PM  Result Value Ref Range Status   Specimen Description URINE, RANDOM  Final   Special Requests NONE  Final   Culture MULTIPLE SPECIES PRESENT, SUGGEST RECOLLECTION (A)  Final   Report Status 11/16/2016 FINAL  Final  MRSA PCR Screening     Status: None   Collection Time: 11/14/16  5:44 AM  Result Value Ref Range Status   MRSA by PCR NEGATIVE NEGATIVE Final    Comment:        The GeneXpert MRSA Assay  (FDA approved for NASAL specimens only), is one component of a comprehensive MRSA colonization surveillance program. It is not intended to diagnose MRSA infection nor to guide or monitor treatment for MRSA infections.   Anaerobic culture     Status: None (Preliminary result)   Collection Time: 11/14/16  9:50 AM  Result Value Ref Range Status   Specimen Description CSF  Final   Special Requests NONE  Final   Culture   Final    NO ANAEROBES ISOLATED; CULTURE IN PROGRESS FOR 5 DAYS   Report Status PENDING  Incomplete  CSF culture     Status: None   Collection Time: 11/14/16  9:50 AM  Result Value Ref Range Status   Specimen Description CSF  Final   Special Requests NONE  Final   Gram Stain   Final    CYTOSPIN SMEAR WBC PRESENT, PREDOMINANTLY MONONUCLEAR NO ORGANISMS SEEN    Culture NO GROWTH 3 DAYS  Final   Report Status 11/17/2016 FINAL  Final  Respiratory Panel by PCR     Status: None   Collection Time: 11/14/16 12:15 PM  Result Value Ref Range Status   Adenovirus NOT DETECTED NOT DETECTED Final   Coronavirus 229E NOT DETECTED NOT DETECTED Final   Coronavirus HKU1 NOT DETECTED NOT DETECTED Final   Coronavirus NL63 NOT DETECTED NOT DETECTED Final   Coronavirus OC43 NOT DETECTED NOT DETECTED Final   Metapneumovirus NOT DETECTED NOT DETECTED Final   Rhinovirus / Enterovirus NOT DETECTED NOT DETECTED Final   Influenza A NOT DETECTED NOT DETECTED Final   Influenza B NOT DETECTED NOT DETECTED Final   Parainfluenza Virus 1 NOT DETECTED NOT DETECTED Final   Parainfluenza Virus 2 NOT DETECTED NOT DETECTED Final   Parainfluenza Virus 3 NOT DETECTED NOT DETECTED Final   Parainfluenza Virus 4 NOT DETECTED NOT DETECTED Final   Respiratory Syncytial Virus NOT DETECTED NOT DETECTED Final   Bordetella pertussis NOT DETECTED NOT DETECTED Final   Chlamydophila pneumoniae NOT DETECTED NOT DETECTED Final   Mycoplasma pneumoniae NOT DETECTED NOT DETECTED Final     Labs: Basic Metabolic  Panel:  Recent Labs Lab 11/13/16 1917 11/15/16 0347 11/17/16  0454  NA 136 142 140  K 4.0 3.5 3.6  CL 106 111 103  CO2 25 26 31   GLUCOSE 123* 93 95  BUN 18 9 10   CREATININE 0.75 0.60 0.53  CALCIUM 8.2* 8.4* 8.5*   Liver Function Tests:  Recent Labs Lab 11/13/16 1917 11/15/16 0347  AST 24 20  ALT 17 17  ALKPHOS 74 54  BILITOT 0.3 0.7  PROT 5.9* 5.4*  ALBUMIN 3.0* 2.7*   No results for input(s): LIPASE, AMYLASE in the last 168 hours. No results for input(s): AMMONIA in the last 168 hours. CBC:  Recent Labs Lab 11/13/16 1917 11/15/16 0347 11/16/16 1102 11/17/16 0454  WBC 11.3* 9.3 8.0 6.6  NEUTROABS 9.5*  --   --   --   HGB 12.7 11.3* 12.1 11.8*  HCT 39.0 35.8* 37.6 36.3  MCV 97.7 98.6 97.7 96.0  PLT 136* 127* 159 174   Cardiac Enzymes: No results for input(s): CKTOTAL, CKMB, CKMBINDEX, TROPONINI in the last 168 hours. BNP: BNP (last 3 results) No results for input(s): BNP in the last 8760 hours.  ProBNP (last 3 results) No results for input(s): PROBNP in the last 8760 hours.  CBG:  Recent Labs Lab 11/14/16 0745 11/15/16 0758 11/16/16 1146 11/17/16 0744  GLUCAP 137* 83 143* 88       Signed:  Sukaina Toothaker MD, PhD  [facility] 11/17/2016, 3:54 PM

## 2016-11-17 NOTE — [place] FL2 (Signed)
[place] MEDICAID FL2 LEVEL OF CARE SCREENING TOOL     IDENTIFICATION  Patient Name: Rebecca [name] Birthdate: 08/04/1927 Sex: female Admission Date (Current Location): 11/13/2016  [facility] and [place] Number:  Herbalist and Address:  [facility],  [place] [address] [place]      Provider Number: [id number]  Attending Physician Name and Address:  Florencia Reasons, MD  Relative Name and Phone Number:       Current Level of Care: Hospital Recommended Level of Care: [place] Prior Approval Number:    Date Approved/Denied:   PASRR Number: [id number]  Discharge Plan: SNF    Current Diagnoses: Patient Active Problem List   Diagnosis Date Noted  . Gross hematuria 11/16/2016  . Acute metabolic encephalopathy 25/95/6387  . GERD (gastroesophageal reflux disease) 05/20/2016  . Torus palatinus 04/18/2016  . Osteoarthritis, multiple sites 10/09/2015  . Back pain 06/01/2015  . Unstable gait 04/16/2015  . Abnormal finding on EKG-new inferior/ septal TWI 03/28/2015  . DNR no code (do not resuscitate) 03/28/2015  . DVT, femoral, acute ([place]) 03/28/2015  . Chronic pulmonary embolism ([place]) 03/28/2015  . Dyslipidemia 03/12/2015  . Sepsis ([place]) 03/11/2015  . Seizure disorder ([place]) 03/11/2015  . Depression 09/26/2014  . Dementia 09/19/2012  . Hypothyroidism 12/08/2006    Orientation RESPIRATION BLADDER Height & Weight     Self  O2 Indwelling catheter Weight: 141 lb (64 kg) Height:  5' (152.4 cm)  BEHAVIORAL SYMPTOMS/MOOD NEUROLOGICAL BOWEL NUTRITION STATUS        Diet (DYS 3)  AMBULATORY STATUS COMMUNICATION OF NEEDS Skin   Extensive Assist Verbally Normal                       Personal Care Assistance Level of Assistance  Bathing, Feeding, Dressing Bathing Assistance: Limited assistance Feeding assistance: Independent Dressing Assistance: Limited assistance     Functional Limitations Info             SPECIAL CARE  FACTORS FREQUENCY  PT (By licensed PT), OT (By licensed OT)     PT Frequency: 5x/week OT Frequency: 5x/week            Contractures Contractures Info: Not present    Additional Factors Info  Code Status, Allergies Code Status Info: DNR Allergies Info: Macrodantin Nitrofurantoin;Morphine And Related;Sulfa Antibiotics           Current Medications (11/17/2016):  This is the current hospital active medication list Current Facility-Administered Medications  Medication Dose Route Frequency Provider Last Rate Last Dose  . 0.9 %  sodium chloride infusion   Intravenous Continuous Bonnielee Haff, MD 10 mL/hr at 11/16/16 2117    . acetaminophen (TYLENOL) tablet 650 mg  650 mg Oral Q6H PRN Ivor Costa, MD      . calcium-vitamin D (OSCAL WITH D) 500-200 MG-UNIT per tablet 2 tablet  2 tablet Oral Daily Ivor Costa, MD   2 tablet at 11/17/16 0915  . cefTRIAXone (ROCEPHIN) 1 g in dextrose 5 % 50 mL IVPB  1 g Intravenous Q24H Bonnielee Haff, MD 100 mL/hr at 11/16/16 1418 1 g at 11/16/16 1418  . cholecalciferol (VITAMIN D) tablet 5,000 Units  5,000 Units Oral Daily Ivor Costa, MD   5,000 Units at 11/17/16 0915  . clonazePAM (KLONOPIN) tablet 0.5 mg  0.5 mg Oral BID Ivor Costa, MD   0.5 mg at 11/17/16 0916  . docusate sodium (COLACE) capsule 100 mg  100 mg Oral BID [place] Pink,  Gokul, MD   100 mg at 11/17/16 0916  . donepezil (ARICEPT) tablet 10 mg  10 mg Oral Daily Ivor Costa, MD   10 mg at 11/17/16 0917  . DULoxetine (CYMBALTA) DR capsule 30 mg  30 mg Oral Daily Ivor Costa, MD   30 mg at 11/17/16 0916  . fluticasone furoate-vilanterol (BREO ELLIPTA) 100-25 MCG/INH 1 puff  1 puff Inhalation Daily Ivor Costa, MD   1 puff at 11/16/16 0815  . guaiFENesin (MUCINEX) 12 hr tablet 600 mg  600 mg Oral QHS Ivor Costa, MD   600 mg at 11/16/16 2115  . haloperidol lactate (HALDOL) injection 2 mg  2 mg Intravenous Q6H PRN Bonnielee Haff, MD   2 mg at 11/15/16 1654  . levothyroxine (SYNTHROID, LEVOTHROID) tablet  100 mcg  100 mcg Oral QAC breakfast Ivor Costa, MD   100 mcg at 11/16/16 2409  . lidocaine (PF) (XYLOCAINE) 1 % injection 10 mL  10 mL Intradermal Once Bonnielee Haff, MD      . memantine [facility]) tablet 10 mg  10 mg Oral BID Ivor Costa, MD   10 mg at 11/17/16 7353  . multivitamin with minerals tablet 1 tablet  1 tablet Oral Daily Ivor Costa, MD   1 tablet at 11/17/16 1000  . MUSCLE RUB CREA 1 application  1 application Topical BID PRN Ivor Costa, MD      . omega-3 acid ethyl esters (LOVAZA) capsule 1 g  1 g Oral Daily Ivor Costa, MD   1 g at 11/17/16 1000  . ondansetron (ZOFRAN) tablet 4 mg  4 mg Oral Q6H PRN Ivor Costa, MD       Or  . ondansetron [facility]) injection 4 mg  4 mg Intravenous Q6H PRN Ivor Costa, MD      . pantoprazole (PROTONIX) EC tablet 40 mg  40 mg Oral Daily Ivor Costa, MD   40 mg at 11/17/16 0917  . phenytoin (DILANTIN) ER capsule 100 mg  100 mg Oral Daily Ivor Costa, MD   100 mg at 11/17/16 0916  . phenytoin (DILANTIN) ER capsule 200 mg  200 mg Oral QHS Ivor Costa, MD   200 mg at 11/16/16 2116  . QUEtiapine (SEROQUEL) tablet 12.5 mg  12.5 mg Oral Q24H Ivor Costa, MD   12.5 mg at 11/16/16 1418  . sodium chloride flush (NS) 0.9 % injection 3 mL  3 mL Intravenous Q12H Ivor Costa, MD   3 mL at 11/17/16 1000  . traMADol-acetaminophen (ULTRACET) 37.5-325 MG per tablet 1 tablet  1 tablet Oral Q6H PRN Irine Seal, MD      . zolpidem Lorrin Mais) tablet 5 mg  5 mg Oral QHS PRN Ivor Costa, MD   5 mg at 11/14/16 2211     Discharge Medications: Please see discharge summary for a list of discharge medications.  Relevant Imaging Results:  Relevant Lab Results:   Additional Information SSN [id number]  Burnis Medin, LCSW

## 2016-11-17 NOTE — Clinical Social Work Placement (Signed)
Patient received and accepted bed offer at [facility]. [facility] contacted, family aware. Patient's RN provided number to call report.  CLINICAL SOCIAL WORK PLACEMENT  NOTE  Date:  11/17/2016  Patient Details  Name: Rebecca [name] MRN: [id number] Date of Birth: 10-30-1927  Clinical Social Work is seeking post-discharge placement for this patient at the [place] level of care (*CSW will initial, date and re-position this form in  chart as items are completed):  Yes   Patient/family provided with [facility] Work Department's list of facilities offering this level of care within the geographic area requested by the patient (or if unable, by the patient's family).  Yes   Patient/family informed of their freedom to choose among providers that offer the needed level of care, that participate in Medicare, Medicaid or managed care program needed by the patient, have an available bed and are willing to accept the patient.  Yes   Patient/family informed of [facility]'s ownership interest in [facility] and [facility], as well as of the fact that they are under no obligation to receive care at these facilities.  PASRR submitted to EDS on       PASRR number received on       Existing PASRR number confirmed on 11/17/16     FL2 transmitted to all facilities in geographic area requested by pt/family on 11/17/16     FL2 transmitted to all facilities within larger geographic area on       Patient informed that his/her managed care company has contracts with or will negotiate with certain facilities, including the following:        Yes   Patient/family informed of bed offers received.  Patient [facility] bed at [facility]     Physician recommends and patient chooses bed at      Patient to be transferred to [facility] on 11/17/16.  Patient to be transferred to facility by [facility]     Patient family notified on 11/17/16 of transfer.  Name  of family member notified:  [name]     PHYSICIAN       Additional Comment:    _______________________________________________ Burnis Medin, LCSW 11/17/2016, 5:20 PM

## 2016-11-18 LAB — CULTURE, BLOOD (ROUTINE X 2)
CULTURE: NO GROWTH
Culture: NO GROWTH
SPECIAL REQUESTS: ADEQUATE
Special Requests: ADEQUATE

## 2016-11-19 LAB — ANAEROBIC CULTURE

## 2016-11-20 ENCOUNTER — Encounter: Payer: Self-pay | Admitting: *Deleted

## 2016-11-20 ENCOUNTER — Encounter (HOSPITAL_COMMUNITY): Payer: Self-pay | Admitting: Urology

## 2016-11-20 LAB — CBC AND DIFFERENTIAL
HEMATOCRIT: 36 (ref 36–46)
Hemoglobin: 12.4 (ref 12.0–16.0)
PLATELETS: 256 (ref 150–399)
WBC: 7

## 2016-11-20 LAB — BASIC METABOLIC PANEL
BUN: 17 (ref 4–21)
Creatinine: 0.6 (ref 0.5–1.1)
Glucose: 105
Potassium: 4.3 (ref 3.4–5.3)
SODIUM: 140 (ref 137–147)

## 2016-11-21 ENCOUNTER — Encounter: Payer: Self-pay | Admitting: Family

## 2016-11-21 ENCOUNTER — Non-Acute Institutional Stay (SKILLED_NURSING_FACILITY): Payer: Medicare Other | Admitting: Family

## 2016-11-21 DIAGNOSIS — D494 Neoplasm of unspecified behavior of bladder: Secondary | ICD-10-CM | POA: Diagnosis not present

## 2016-11-21 DIAGNOSIS — R531 Weakness: Secondary | ICD-10-CM | POA: Diagnosis not present

## 2016-11-21 DIAGNOSIS — R053 Chronic cough: Secondary | ICD-10-CM

## 2016-11-21 DIAGNOSIS — R05 Cough: Secondary | ICD-10-CM

## 2016-11-21 NOTE — Progress Notes (Addendum)
Location:  [facility] Room Number: 34 Place of Service:  SNF (31) Provider: Jaidev Sanger FNP-C  [name], MD  Patient Care Team: [name], MD as PCP - General (Internal Medicine) [name], [facility] [name], MD as Consulting Physician (Orthopedic Surgery) [name], MD as Consulting Physician (Physical Medicine and Rehabilitation) Melina Schools, MD as Consulting Physician (Orthopedic Surgery) [name], MD as Consulting Physician (Otolaryngology) Kamyrah Feeser, [name], NP as Nurse Practitioner (Family Medicine)  Extended Emergency Contact Information Primary Emergency Contact: [name] Address: [address]          [name] of [place] Phone: [phone] Relation: Son Secondary Emergency Contact: [name] [place] of [place] Phone: [phone] Mobile Phone: [phone] Relation: Daughter  Code Status:  DNR Goals of care: Advanced Directive information Advanced Directives 11/21/2016  Does Patient Have a Medical Advance Directive? Yes  Type of Advance Directive Out of facility DNR (pink MOST or yellow form);Living will;Healthcare Power of Attorney  Does patient want to make changes to medical advance directive? -  Copy of [place] in Chart? Yes  Pre-existing out of facility DNR order (yellow form or pink MOST form) Yellow form placed in chart (order not valid for inpatient use)     Chief Complaint  Patient presents with  . Hospitalization Follow-up  . Acute Visit    HPI:  Pt is a 83 y.o. female seen today at [facility] for an acute visit for follow up hospitalization. She is status post hospital admission from 11/13/2016-11/17/2016 for Altered mental status and fever. She was treated for sepsis of unknown etiology. Meningitis was rule out. Cultures were unrevealing and LP was negative therefore antibiotics was discontinued. CXR showed chronic interstitial  lung disease with hypoxia.CT scan head was negative for acute intracranial abnormalities.CT scan of the ABD showed a nonobstructive kidney stone but more concerning was bladder density. Urology was consulted. She underwent cystoscopy with clot evacuation and bilateral retrograde pyelograms. She had a transurethral resection of medium bladder tumor. Her pathology results is pending. She voided without any difficulties thus was discharged to SNF. Outpatient Pulmonary and urology follow up recommended. She denies any acute issues this visit. Facility Nurse reports no new concerns. She continues to work with Physical therapy.    Past Medical History:  Diagnosis Date  . Anemia, unspecified 03/18/2011  . Anxiety state, unspecified 01/2011  . Blood in stool 06/15/2012  . Corns and callosities 07/01/2011  . Diverticulosis 02/2011  . Dizziness   . DJD (degenerative joint disease)   . Dysuria 06/17/2011  . Hemorrhoids 02/2011  . Low back pain   . Mitral valve problem thickened   thickened  . Osteoarthritis of both hands 10/09/2015  . Osteoporosis 02/2011  . Other abnormal blood chemistry 0/30/2012  . Other acquired deformity of toe 12/16/2011  . Pain in joint, site unspecified 01/2012  . 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 Sacroiliitis, not elsewhere classified ([place]) 05/2011  . Seizures ([place])    Remotely  . Unspecified hypothyroidism 01/2011  . 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 Vitamin D deficiency 01/2011   Past Surgical History:  Procedure Laterality Date  . bletheroplasty    . CATARACT EXTRACTION W/ INTRAOCULAR LENS  IMPLANT, BILATERAL  2010  . CERVICAL LAMINECTOMY  2005  . CYSTOSCOPY WITH RETROGRADE PYELOGRAM, URETEROSCOPY AND STENT PLACEMENT Bilateral 11/16/2016   Procedure: CYSTOSCOPY WITH RETROGRADE PYELOGRAMS/CLOT EVACUATION;  Surgeon: Irine Seal, MD;  Location: [facility];  Service: Urology;  Laterality: Bilateral;  . HAMMER TOE  SURGERY  2013   right 2nd toe Dr. Mallie Mussel  . JOINT REPLACEMENT Bilateral 2002 Right, 1992 Left   knees  . TRANSURETHRAL RESECTION OF BLADDER TUMOR N/A 11/16/2016   Procedure: TRANSURETHRAL RESECTION OF BLADDER TUMOR (TURBT);  Surgeon: Irine Seal, MD;  Location: [facility];  Service: Urology;  Laterality: N/A;    Allergies  Allergen Reactions  . Macrodantin [Nitrofurantoin]   . Morphine And Related Other (See Comments)    Altered mental state   . Sulfa Antibiotics Other (See Comments)    Reaction: unknown    Allergies as of 11/21/2016      Reactions   Macrodantin [nitrofurantoin]    Morphine And Related Other (See Comments)   Altered mental state    Sulfa Antibiotics Other (See Comments)   Reaction: unknown      Medication List       Accurate as of 11/21/16 11:59 PM. Always use your most recent med list.          acetaminophen 325 MG tablet Commonly known as:  TYLENOL Take 650 mg by mouth 2 (two) times daily. Take 2 tablets twice daily   amoxicillin 500 MG capsule Commonly known as:  AMOXIL Take 2,000 mg by mouth as directed. 1 Hour Before Procedure.   BREO ELLIPTA 100-25 MCG/INH Aepb Generic drug:  fluticasone furoate-vilanterol Inhale 1 puff into the lungs daily.   CALCIUM 600+D 600-400 MG-UNIT tablet Generic drug:  Calcium Carbonate-Vitamin D Take 1 tablet by mouth daily.   clonazePAM 0.5 MG tablet Commonly known as:  KLONOPIN Take one tablet by mouth 2 times a day at 8 am and 8 pm.   donepezil 10 MG tablet Commonly known as:  ARICEPT Take 10 mg by mouth daily.   DULoxetine 30 MG capsule Commonly known as:  CYMBALTA Take 30 mg by mouth daily.   levothyroxine 100 MCG tablet Commonly known as:  SYNTHROID, LEVOTHROID Take 1 tablet (100 mcg total) by mouth daily before breakfast. For thyroid   meloxicam 7.5 MG tablet Commonly known as:  MOBIC Take 7.5 mg by mouth daily as needed for pain.   memantine 10 MG tablet Commonly known as:  NAMENDA Take 10 mg by  mouth 2 (two) times daily.   Omega-3 1000 MG Caps Take 1 capsule by mouth daily. Take one capsule every morning   omeprazole 20 MG capsule Commonly known as:  PRILOSEC Take 20 mg by mouth at bedtime.   OXYGEN Inhale 2 L/min into the lungs continuous.   phenytoin 100 MG ER capsule Commonly known as:  DILANTIN Take 100-200 mg by mouth See admin instructions. Take one capsule every morning; take 2 capsules at bedtime   QUEtiapine 25 MG tablet Commonly known as:  SEROQUEL Take 12.5 mg by mouth daily. At 1 pm   THERA M PLUS PO Take 1 tablet by mouth daily.   triamcinolone cream 0.1 % Commonly known as:  KENALOG Apply 1 application topically daily as needed (for rash).   trolamine salicylate 10 % cream Commonly known as:  ASPERCREME Apply 1 application topically. Apply as needed   Vitamin D3 5000 units Tabs Take 1 tablet by mouth daily. Take one tablet daily       Review of Systems  Constitutional: Negative for appetite change, chills, fatigue and fever.  HENT: Negative for congestion, rhinorrhea, sinus pain, sinus pressure, sneezing, sore throat and trouble swallowing.  Eyes: Negative for pain, discharge, redness and itching.  Respiratory: Negative for chest tightness, shortness of breath and wheezing.        Chronic cough   Cardiovascular: Negative for chest pain, palpitations and leg swelling.  Gastrointestinal: Negative for abdominal distention, abdominal pain, constipation, diarrhea, nausea and vomiting.  Endocrine: Negative.   Genitourinary: Negative for difficulty urinating, dysuria, flank pain, frequency, hematuria and urgency.  Musculoskeletal: Positive for gait problem.  Skin: Negative for color change, pallor, rash and wound.  Neurological: Negative for dizziness, seizures, syncope, light-headedness and headaches.  Hematological: Does not bruise/bleed easily.  Psychiatric/Behavioral: Negative for agitation, hallucinations and sleep disturbance. The patient is  not nervous/anxious.        Confused at her baseline     Immunization History  Administered Date(s) Administered  . Influenza Whole 03/17/2007, 02/16/2009, 01/30/2010, 02/16/2013  . Influenza-Unspecified 03/02/2014, 02/22/2015, 02/28/2016  . PPD Test 07/09/2010, 06/04/2015  . Pneumococcal Polysaccharide-23 05/19/2005, 05/23/2013  . Td 05/19/2005  . Tetanus 10/06/2012   Pertinent  Health Maintenance Due  Topic Date Due  . DEXA SCAN  05/19/2017 (Originally 11/19/1994)  . PNA vac Low Risk Adult (2 of 2 - PCV13) 05/19/2017 (Originally 05/23/2014)  . INFLUENZA VACCINE  12/17/2016   Fall Risk  10/09/2015 07/10/2015 03/26/2015 12/19/2014 03/22/2013  Falls in the past year? No Yes Yes Yes Yes  Number falls in past yr: - 1 1 1 2  or more  Injury with Fall? - Yes No Yes -    Vitals:   11/21/16 0925  BP: 118/64  Pulse: 78  Resp: 16  Temp: 98.7 F (37.1 C)  SpO2: 94%  Weight: 141 lb (64 kg)  Height: 5' (1.524 m)   Body mass index is 27.54 kg/m. Physical Exam  Constitutional:  Thin frail elderly in no acute distress   HENT:  Head: Normocephalic.  Mouth/Throat: Oropharynx is clear and moist. No oropharyngeal exudate.  Eyes: Conjunctivae and EOM are normal. Pupils are equal, round, and reactive to light. Right eye exhibits no discharge. Left eye exhibits no discharge. No scleral icterus.  Neck: Neck supple. No JVD present. No thyromegaly present.  Cardiovascular: Normal rate, regular rhythm, normal heart sounds and intact distal pulses.  Exam reveals no gallop and no friction rub.   No murmur heard. Pulmonary/Chest: Effort normal and breath sounds normal. No respiratory distress. She has no wheezes. She has no rales.  Oxygen 2 liters via [place] in place  Abdominal: Soft. Bowel sounds are normal. She exhibits no distension. There is no tenderness. There is no rebound and no guarding.  Musculoskeletal: She exhibits no edema, tenderness or deformity.  Moves x 4 extremities. Unsteady gait with  generalized weakness. Uses FWW.   Lymphadenopathy:    She has no cervical adenopathy.  Neurological: She is alert. Coordination normal.  Pleasantly confused at her base line.   Skin: Skin is warm and dry. No rash noted. No erythema. No pallor.  Psychiatric: She has a normal mood and affect.    Labs reviewed:  Recent Labs  11/13/16 1917 11/15/16 0347 11/17/16 0454 11/20/16  NA 136 142 140 140  K 4.0 3.5 3.6 4.3  CL 106 111 103  --   CO2 25 26 31   --   GLUCOSE 123* 93 95  --   BUN 18 9 10 17   CREATININE 0.75 0.60 0.53 0.6  CALCIUM 8.2* 8.4* 8.5*  --     Recent Labs  03/05/16 1730 07/17/16 11/13/16 1917 11/15/16 0347  AST 27 21  24 20  ALT 19 11 17 17   ALKPHOS 82 77 74 54  BILITOT 0.5  --  0.3 0.7  PROT 6.4*  --  5.9* 5.4*  ALBUMIN 3.5  --  3.0* 2.7*    Recent Labs  03/05/16 1730  11/13/16 1917 11/15/16 0347 11/16/16 1102 11/17/16 0454 11/20/16  WBC 5.9  < > 11.3* 9.3 8.0 6.6 7.0  NEUTROABS 3.9  --  9.5*  --   --   --   --   HGB 12.6  < > 12.7 11.3* 12.1 11.8* 12.4  HCT 39.4  < > 39.0 35.8* 37.6 36.3 36  MCV 99.5  --  97.7 98.6 97.7 96.0  --   PLT 173  < > 136* 127* 159 174 256  < > = values in this interval not displayed. Lab Results  Component Value Date   TSH 0.533 11/13/2016   Lab Results  Component Value Date   HGBA1C 5.4 03/08/2015   Lab Results  Component Value Date   CHOL 235 (H) 03/06/2009   HDL 108.40 03/06/2009   LDLDIRECT 108.2 03/06/2009    Significant Diagnostic Results in last 30 days:  Dg Chest 2 View  Result Date: 11/15/2016 CLINICAL DATA:  Cough. EXAM: CHEST  2 VIEW COMPARISON:  11/13/2016, chest CT 03/28/2015 FINDINGS: The cardiac silhouette is stably enlarged. Mediastinal contours appear intact. There is no evidence of focal airspace consolidation, pleural effusion or pneumothorax. Chronic severe interstitial disease, stable in appearance. Osseous structures are without acute abnormality. Soft tissues are grossly normal.  IMPRESSION: Severe chronic interstitial lung disease. No definite evidence of superimposed acute airspace consolidation. Mild cardiomegaly. Electronically Signed   By: Fidela Salisbury M.D.   On: 11/15/2016 13:37   Dg Chest 2 View  Result Date: 11/13/2016 CLINICAL DATA:  Fever and decreased mental status EXAM: CHEST  2 VIEW COMPARISON:  04/02/2015.  03/28/2015. FINDINGS: AP and lateral views of the chest show enlargement of the cardiopericardial silhouette. There is diffuse underlying chronic interstitial lung disease, as before. No definite superimposed pulmonary edema or focal pneumonia. No substantial pleural effusion. The visualized bony structures of the thorax are intact. Telemetry leads overlie the chest. IMPRESSION: Chronic underlying interstitial lung disease, similar to prior studies. No definite superimposed focal pneumonia or pulmonary edema at this time. Electronically Signed   By: Misty Stanley M.D.   On: 11/13/2016 20:09   Ct Head Wo Contrast  Result Date: 11/13/2016 CLINICAL DATA:  83 year old female with altered mental status and fever. EXAM: CT HEAD WITHOUT CONTRAST TECHNIQUE: Contiguous axial images were obtained from the base of the skull through the vertex without intravenous contrast. COMPARISON:  03/30/2016 and prior exams FINDINGS: Brain: No evidence of acute infarction, hemorrhage, hydrocephalus, extra-axial collection or mass lesion/mass effect. Atrophy, chronic small-vessel white matter ischemic changes and remote left occipital infarct again noted. Vascular: Intracranial atherosclerotic calcifications noted. Skull: Normal. Negative for fracture or focal lesion. Sinuses/Orbits: No acute finding. Other: None. IMPRESSION: No at evidence of acute intracranial abnormality. Atrophy, chronic small-vessel white matter ischemic changes and remote left occipital infarct. Electronically Signed   By: Margarette Canada M.D.   On: 11/13/2016 22:47   Mr Brain Wo Contrast  Result Date:  11/16/2016 CLINICAL DATA:  Dementia.  Seizure disorder.  Acute encephalopathy. EXAM: MRI HEAD WITHOUT CONTRAST TECHNIQUE: Multiplanar, multiecho pulse sequences of the brain and surrounding structures were obtained without intravenous contrast. COMPARISON:  CT 11/13/2016. FINDINGS: Brain: Diffusion imaging does not show any acute or subacute infarction or  other cause of restricted diffusion. There chronic small-vessel ischemic changes of the pons. Cerebellar atrophy without focal insult. Cerebral hemispheres show generalized atrophy with an old left occipital cortical and subcortical infarction and chronic small-vessel ischemic changes of the hemispheric white matter. No mass lesion, hemorrhage, hydrocephalus or extra-axial collection. No pituitary mass. Vascular: Major vessels at the base of the brain show flow. Skull and upper cervical spine: Negative Sinuses/Orbits: Clear/normal Other: None significant IMPRESSION: No acute or reversible finding. Atrophy and chronic small-vessel ischemic changes. 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 Electronically Signed   By: Nelson Chimes M.D.   On: 11/16/2016 07:49   Dg C-arm 1-60 Min-no Report  Result Date: 11/16/2016 Fluoroscopy was utilized by the requesting physician.  No radiographic interpretation.   Ct Renal Stone Study  Result Date: 11/16/2016 CLINICAL DATA:  Gross hematuria. EXAM: CT ABDOMEN AND PELVIS WITHOUT CONTRAST TECHNIQUE: Multidetector CT imaging of the abdomen and pelvis was performed following the standard protocol without IV contrast. COMPARISON:  None. FINDINGS: Lower chest: Chronic bibasilar interstitial fibrosis. Small hiatal hernia. Hepatobiliary: No masses visualized on this unenhanced exam. Gallbladder is unremarkable. Pancreas: No mass or inflammatory process visualized on this unenhanced exam. Spleen:  Within normal limits in size. Adrenals/Urinary tract: 3 mm nonobstructing calculus seen in lower pole of left kidney. No evidence of ureteral  calculi or hydronephrosis. Irregular high attenuation density measuring approximately 6 cm is seen in the right posterior urinary bladder which likely represents blood clot. Underlying bladder neoplasm cannot be excluded. Stomach/Bowel: No evidence of obstruction, inflammatory process, or abnormal fluid collections. Colonic diverticulosis, without radiographic evidence of diverticulitis. Vascular/Lymphatic: No pathologically enlarged lymph nodes identified. No evidence of abdominal aortic aneurysm. Aortic atherosclerosis. IVC filter in appropriate position. Reproductive:  No mass or other significant abnormality. Other:  None. Musculoskeletal: No suspicious bone lesions identified. Severe degenerative lumbar spondylosis. IMPRESSION: High attenuation density in urinary bladder, likely representing blood clot. Underlying bladder neoplasm cannot be excluded. Consider cystoscopy for further evaluation. 3 mm nonobstructing left renal calculus. No evidence of ureteral calculi or hydronephrosis. Colonic diverticulosis, without radiographic evidence of diverticulitis. Small hiatal hernia. Aortic Atherosclerosis (ICD10-I70.0). Electronically Signed   By: Earle Gell M.D.   On: 11/16/2016 11:22   Dg Fluoro Guide Lumbar Puncture  Result Date: 11/14/2016 CLINICAL DATA:  Fever of unknown origin.  Altered mental status. EXAM: DIAGNOSTIC LUMBAR PUNCTURE UNDER FLUOROSCOPIC GUIDANCE FLUOROSCOPY TIME:  Fluoroscopy Time:  1 minutes PROCEDURE: Informed consent was obtained from the patient's son by phone prior to the procedure, including potential complications of headache, allergy, and pain. With the patient prone, the lower back was prepped with Betadine. 1% Lidocaine was used for local anesthesia. Lumbar puncture was performed at the L3-4 level using a 21 gauge needle with return of clear colorless CSF. 20 ml of CSF were obtained for laboratory studies. The patient tolerated the procedure well and there were no apparent  complications. IMPRESSION: Lumbar puncture performed without complication. Laboratory results to follow. Electronically Signed   By: Lorriane Shire M.D.   On: 11/14/2016 10:08    Assessment/Plan 1. Generalized weakness Afebrile.Will have her continue with therapy for ROM, Exercise and muscle strengthening. Fall and safety precautions.Obtain CBC/diff and CMP 11/24/2016.   2. Bladder tumor CT scan of the ABD during hospital admission showed a nonobstructive kidney stone but more concerning was bladder density. Urology was consulted. She underwent cystoscopy with clot evacuation and bilateral retrograde pyelograms. She had a transurethral resection of medium bladder tumor. Her pathology results is pending.Schedule  urology follow up with Dr. Jeffie Pollock per hospital discharge.    3. Chronic cough Afebrile.CXR showed chronic interstitial lung disease with hypoxia.continue on oxygen 2 liters via [place]. Schedule a follow up appointment with pulmonologist for hospital follow up.   Family/ staff Communication: Reviewed plan of care with patient and facility Nurse supervisor  Labs/tests ordered: CBC/diff and CMP 11/24/2016.  Sandrea Hughs, NP

## 2016-11-24 ENCOUNTER — Encounter: Payer: Self-pay | Admitting: Internal Medicine

## 2016-11-24 ENCOUNTER — Non-Acute Institutional Stay (SKILLED_NURSING_FACILITY): Payer: Medicare Other | Admitting: Internal Medicine

## 2016-11-24 DIAGNOSIS — R1310 Dysphagia, unspecified: Secondary | ICD-10-CM

## 2016-11-24 DIAGNOSIS — F0151 Vascular dementia with behavioral disturbance: Secondary | ICD-10-CM | POA: Diagnosis not present

## 2016-11-24 DIAGNOSIS — N200 Calculus of kidney: Secondary | ICD-10-CM | POA: Diagnosis not present

## 2016-11-24 DIAGNOSIS — M150 Primary generalized (osteo)arthritis: Secondary | ICD-10-CM

## 2016-11-24 DIAGNOSIS — K219 Gastro-esophageal reflux disease without esophagitis: Secondary | ICD-10-CM | POA: Diagnosis not present

## 2016-11-24 DIAGNOSIS — F329 Major depressive disorder, single episode, unspecified: Secondary | ICD-10-CM

## 2016-11-24 DIAGNOSIS — J9611 Chronic respiratory failure with hypoxia: Secondary | ICD-10-CM

## 2016-11-24 DIAGNOSIS — F01518 Vascular dementia, unspecified severity, with other behavioral disturbance: Secondary | ICD-10-CM

## 2016-11-24 DIAGNOSIS — R531 Weakness: Secondary | ICD-10-CM

## 2016-11-24 DIAGNOSIS — F411 Generalized anxiety disorder: Secondary | ICD-10-CM | POA: Diagnosis not present

## 2016-11-24 DIAGNOSIS — D494 Neoplasm of unspecified behavior of bladder: Secondary | ICD-10-CM | POA: Diagnosis not present

## 2016-11-24 DIAGNOSIS — G40909 Epilepsy, unspecified, not intractable, without status epilepticus: Secondary | ICD-10-CM | POA: Diagnosis not present

## 2016-11-24 DIAGNOSIS — M159 Polyosteoarthritis, unspecified: Secondary | ICD-10-CM

## 2016-11-24 DIAGNOSIS — F32A Depression, unspecified: Secondary | ICD-10-CM

## 2016-11-24 DIAGNOSIS — J309 Allergic rhinitis, unspecified: Secondary | ICD-10-CM

## 2016-11-24 NOTE — Progress Notes (Signed)
Provider:  [name] MD  Location:  [name] Room Number: 12 Place of Service:  SNF (31)  PCP: [name], MD Patient Care Team: [name], MD as PCP - General (Internal Medicine) [name], [facility] [name], MD as Consulting Physician (Orthopedic Surgery) [name], MD as Consulting Physician (Physical Medicine and Rehabilitation) Melina Schools, MD as Consulting Physician (Orthopedic Surgery) [name], MD as Consulting Physician (Otolaryngology) [name], [name], NP as Nurse Practitioner (Family Medicine)  Extended Emergency Contact Information Primary Emergency Contact: [name] Address: [address], [place] Phone: [phone] Relation: Son Secondary Emergency Contact: [name] [place] Phone: [phone] Mobile Phone: [phone] Relation: Daughter  Code Status: DNR Goals of Care: Advanced Directive information Advanced Directives 11/21/2016  Does Patient Have a Medical Advance Directive? Yes  Type of Advance Directive Out of facility DNR (pink MOST or yellow form);Living will;Healthcare Power of Attorney  Does patient want to make changes to medical advance directive? -  Copy of [place] in Chart? Yes  Pre-existing out of facility DNR order (yellow form or pink MOST form) Yellow form placed in chart (order not valid for inpatient use)      Chief Complaint  Patient presents with  . New Admit To SNF    New Admission Visit     HPI: Patient is a 83 y.o. female seen today for admission visit to SNF. She was in the hospital from 11/13/16-11/17/16 with altered mental state and fever. Infectious workup including u/a with culture, chest xray, lumbar puncture and blood work were unrevealing. CT head, MRI brain were negative for acute finding and EEG was negative for seizure activities. She was empirically placed on antibiotic  pending lab results. Her iv antibiotics were discontinued with negative workup but she was placed on po augmentin for presumed pneumonia. Her fever has resolved and her mental state has returned to her baseline. During her hospital stay, she developed hematuria and urology was consulted. CT abdomen/pelvis showed non obstructing kidney stone and possible hematoma vs tumor of bladder. She underwent cystoscopy with clot evacuation and bilateral retrograde pyelograms. She also underwent transurethral resection of medium bladder tumor with pathology result pending at discharge. She had acute on chronic hypoxic respiratory failure with findings suggestive of interstitial changes. She was placed on oxygen by nasal canula. She has medical history of dementia, GERD, depression, seizure, chronic back pain, hypothyroidism, PE s/p IVC filter, GI bleed among others. She is seen in her room today. She is pleasantly confused.   Past Medical History:  Diagnosis Date  . Anemia, unspecified 03/18/2011  . Anxiety state, unspecified 01/2011  . Blood in stool 06/15/2012  . Corns and callosities 07/01/2011  . Diverticulosis 02/2011  . Dizziness   . DJD (degenerative joint disease)   . Dysuria 06/17/2011  . Hemorrhoids 02/2011  . Low back pain   . Mitral valve problem thickened   thickened  . Osteoarthritis of both hands 10/09/2015  . Osteoporosis 02/2011  . Other abnormal blood chemistry 0/30/2012  . Other acquired deformity of toe 12/16/2011  . Pain in joint, site unspecified 01/2012  . 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 Sacroiliitis, not elsewhere classified ([place]) 05/2011  . Seizures ([place])    Remotely  . Unspecified hypothyroidism 01/2011  . 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 Vitamin D  deficiency 01/2011   Past Surgical History:  Procedure Laterality Date  . bletheroplasty    . CATARACT EXTRACTION W/ INTRAOCULAR LENS  IMPLANT, BILATERAL  2010  .  CERVICAL LAMINECTOMY  2005  . CYSTOSCOPY WITH RETROGRADE PYELOGRAM, URETEROSCOPY AND STENT PLACEMENT Bilateral 11/16/2016   Procedure: CYSTOSCOPY WITH RETROGRADE PYELOGRAMS/CLOT EVACUATION;  Surgeon: Irine Seal, MD;  Location: [facility];  Service: Urology;  Laterality: Bilateral;  . HAMMER TOE SURGERY  2013   right 2nd toe Dr. Mallie Mussel  . JOINT REPLACEMENT Bilateral 2002 Right, 1992 Left   knees  . TRANSURETHRAL RESECTION OF BLADDER TUMOR N/A 11/16/2016   Procedure: TRANSURETHRAL RESECTION OF BLADDER TUMOR (TURBT);  Surgeon: Irine Seal, MD;  Location: [facility];  Service: Urology;  Laterality: N/A;    reports that she quit smoking about 28 years ago. She has never used smokeless tobacco. She reports that she drinks about 0.6 oz of alcohol per week . She reports that she does not use drugs. Social History   Social History  . Marital status: Widowed    Spouse name: N/A  . Number of children: N/A  . Years of education: N/A   Occupational History  . Not on file.   Social History Main Topics  . Smoking status: Former Smoker    Quit date: 09/14/1988  . Smokeless tobacco: Never Used     Comment: Smoked <1ppd  . Alcohol use 0.6 oz/week    1 Glasses of wine per week     Comment: doesn't drink routinely & only on social occasions  . Drug use: No  . Sexual activity: No   Other Topics Concern  . Not on file   Social History Narrative   [facility] at [facility] since 10/07/[facility], moved to [place] 01/16/2015   Widowed   Exercise class 3 times a week     Never smoked   Alcholol wine social    POA, Living Will, DNR      [facility]:   Primary MPOA:  Christell Faith (Daughter) - [phone]   Secondary MPOA:  Shritha Bresee [facility]) - [phone]    Functional Status Survey:    Family History  Problem Relation Age of Onset  . Alzheimer's disease Sister   . Emphysema Sister     Health Maintenance  Topic Date Due  . DEXA SCAN  05/19/2017 (Originally 11/19/1994)  . PNA vac Low Risk  Adult (2 of 2 - PCV13) 05/19/2017 (Originally 05/23/2014)  . INFLUENZA VACCINE  12/17/2016  . TETANUS/TDAP  10/07/2022    Allergies  Allergen Reactions  . Macrodantin [Nitrofurantoin]   . Morphine And Related Other (See Comments)    Altered mental state   . Sulfa Antibiotics Other (See Comments)    Reaction: unknown    Outpatient Encounter Prescriptions as of 11/24/2016  Medication Sig  . acetaminophen (TYLENOL) 325 MG tablet Take 650 mg by mouth 2 (two) times daily. Take 2 tablets twice daily   . amoxicillin (AMOXIL) 500 MG capsule Take 2,000 mg by mouth as directed. 1 Hour Before Procedure.  . Calcium Carbonate-Vitamin D (CALCIUM 600+D) 600-400 MG-UNIT tablet Take 1 tablet by mouth daily.  . Cholecalciferol (VITAMIN D3) 5000 units TABS Take 1 tablet by mouth daily. Take one tablet daily   . clonazePAM (KLONOPIN) 0.5 MG tablet Take one tablet by mouth 2 times a day at 8 am and 8 pm.  . donepezil (ARICEPT) 10 MG tablet Take 10 mg by mouth daily.  . DULoxetine (CYMBALTA) 30 MG capsule Take  30 mg by mouth daily.   . fluticasone furoate-vilanterol (BREO ELLIPTA) 100-25 MCG/INH AEPB Inhale 1 puff into the lungs daily.  [place] Kitchen levothyroxine (SYNTHROID, LEVOTHROID) 100 MCG tablet Take 1 tablet (100 mcg total) by mouth daily before breakfast. For thyroid  . loratadine (CLARITIN) 10 MG tablet Take 10 mg by mouth daily. Stop date 12/01/16  . meloxicam (MOBIC) 7.5 MG tablet Take 7.5 mg by mouth daily as needed for pain.   . memantine (NAMENDA) 10 MG tablet Take 10 mg by mouth 2 (two) times daily.  . Multiple Vitamins-Minerals (THERA M PLUS PO) Take 1 tablet by mouth daily.   . Omega-3 1000 MG CAPS Take 1 capsule by mouth daily. Take one capsule every morning   . omeprazole (PRILOSEC) 20 MG capsule Take 20 mg by mouth at bedtime.  . OXYGEN Inhale 2 L/min into the lungs continuous.  . phenytoin (DILANTIN) 100 MG ER capsule Take 100-200 mg by mouth See admin instructions. Take one capsule every morning;  take 2 capsules at bedtime   . QUEtiapine (SEROQUEL) 25 MG tablet Take 12.5 mg by mouth daily. At 1 pm  . triamcinolone cream (KENALOG) 0.1 % Apply 1 application topically daily as needed (for rash).   . trolamine salicylate (ASPERCREME) 10 % cream Apply 1 application topically. Apply as needed   No facility-administered encounter medications on file as of 11/24/2016.     Review of Systems  Constitutional: Positive for fatigue. Negative for appetite change, chills, diaphoresis and fever.  HENT: Negative for congestion, mouth sores, rhinorrhea and trouble swallowing.   Respiratory: Positive for cough. Negative for shortness of breath and wheezing.        Occasional with yellow phlegm  Cardiovascular: Negative for chest pain, palpitations and leg swelling.  Gastrointestinal: Negative for abdominal pain, diarrhea, nausea and vomiting.       Last bowel movement was yesterday (small).  Genitourinary: Negative for dysuria.  Musculoskeletal: Positive for back pain.  Neurological: Positive for weakness. Negative for dizziness.       Generalized weakness, has history of seizures  Psychiatric/Behavioral: Positive for confusion. Negative for behavioral problems.    Vitals:   11/24/16 1214  BP: 99/64  Pulse: 69  Resp: 18  Temp: 97.7 F (36.5 C)  TempSrc: Oral  SpO2: 94%  Weight: 138 lb (62.6 kg)  Height: 5' (1.524 m)   Body mass index is 26.95 kg/m. Physical Exam  Constitutional: She appears well-developed and well-nourished. No distress.  HENT:  Head: Normocephalic and atraumatic.  Mouth/Throat: Oropharynx is clear and moist.  Eyes: Conjunctivae and EOM are normal. Pupils are equal, round, and reactive to light.  Neck: Normal range of motion. Neck supple. No thyromegaly present.  Cardiovascular: Normal rate and regular rhythm.   Murmur heard. Pulmonary/Chest: Effort normal. No respiratory distress. She has no wheezes. She has rales.  On 2 liters oxygen by nasal canula  Abdominal:  Soft. Bowel sounds are normal. She exhibits no distension. There is no tenderness. There is no guarding.  Musculoskeletal: Normal range of motion. She exhibits no edema.  Can move all 4 extremities, has arthritis changes, uses a walker  Lymphadenopathy:    She has no cervical adenopathy.  Neurological: She is alert.  Oriented only to self  Skin: Skin is warm and dry. No rash noted. She is not diaphoretic.  Psychiatric: She has a normal mood and affect.  Pleasantly confused    Labs reviewed: Basic Metabolic Panel:  Recent Labs  11/13/16 1917 11/15/16 0347 11/17/16  0454 11/20/16  NA 136 142 140 140  K 4.0 3.5 3.6 4.3  CL 106 111 103  --   CO2 25 26 31   --   GLUCOSE 123* 93 95  --   BUN 18 9 10 17   CREATININE 0.75 0.60 0.53 0.6  CALCIUM 8.2* 8.4* 8.5*  --    Liver Function Tests:  Recent Labs  03/05/16 1730 07/17/16 11/13/16 1917 11/15/16 0347  AST 27 21 24 20   ALT 19 11 17 17   ALKPHOS 82 77 74 54  BILITOT 0.5  --  0.3 0.7  PROT 6.4*  --  5.9* 5.4*  ALBUMIN 3.5  --  3.0* 2.7*   No results for input(s): LIPASE, AMYLASE in the last 8760 hours. No results for input(s): AMMONIA in the last 8760 hours. CBC:  Recent Labs  03/05/16 1730  11/13/16 1917 11/15/16 0347 11/16/16 1102 11/17/16 0454 11/20/16  WBC 5.9  < > 11.3* 9.3 8.0 6.6 7.0  NEUTROABS 3.9  --  9.5*  --   --   --   --   HGB 12.6  < > 12.7 11.3* 12.1 11.8* 12.4  HCT 39.4  < > 39.0 35.8* 37.6 36.3 36  MCV 99.5  --  97.7 98.6 97.7 96.0  --   PLT 173  < > 136* 127* 159 174 256  < > = values in this interval not displayed. Cardiac Enzymes: No results for input(s): CKTOTAL, CKMB, CKMBINDEX, TROPONINI in the last 8760 hours. BNP: Invalid input(s): POCBNP Lab Results  Component Value Date   HGBA1C 5.4 03/08/2015   Lab Results  Component Value Date   TSH 0.533 11/13/2016   Lab Results  Component Value Date   IRJJOACZ66 063 01/27/2011   Lab Results  Component Value Date   FOLATE 16.4 01/27/2011     Lab Results  Component Value Date   IRON 41 (L) 01/27/2011   TIBC 159 (L) 01/27/2011   FERRITIN 406 (H) 01/27/2011    Imaging and Procedures obtained prior to SNF admission: Dg Chest 2 View  Result Date: 11/13/2016 CLINICAL DATA:  Fever and decreased mental status EXAM: CHEST  2 VIEW COMPARISON:  04/02/2015.  03/28/2015. FINDINGS: AP and lateral views of the chest show enlargement of the cardiopericardial silhouette. There is diffuse underlying chronic interstitial lung disease, as before. No definite superimposed pulmonary edema or focal pneumonia. No substantial pleural effusion. The visualized bony structures of the thorax are intact. Telemetry leads overlie the chest. IMPRESSION: Chronic underlying interstitial lung disease, similar to prior studies. No definite superimposed focal pneumonia or pulmonary edema at this time. Electronically Signed   By: Misty Stanley M.D.   On: 11/13/2016 20:09   Ct Head Wo Contrast  Result Date: 11/13/2016 CLINICAL DATA:  83 year old female with altered mental status and fever. EXAM: CT HEAD WITHOUT CONTRAST TECHNIQUE: Contiguous axial images were obtained from the base of the skull through the vertex without intravenous contrast. COMPARISON:  03/30/2016 and prior exams FINDINGS: Brain: No evidence of acute infarction, hemorrhage, hydrocephalus, extra-axial collection or mass lesion/mass effect. Atrophy, chronic small-vessel white matter ischemic changes and remote left occipital infarct again noted. Vascular: Intracranial atherosclerotic calcifications noted. Skull: Normal. Negative for fracture or focal lesion. Sinuses/Orbits: No acute finding. Other: None. IMPRESSION: No at evidence of acute intracranial abnormality. Atrophy, chronic small-vessel white matter ischemic changes and remote left occipital infarct. Electronically Signed   By: Margarette Canada M.D.   On: 11/13/2016 22:47   Dg Fluoro Guide Lumbar Puncture  Result Date: 11/14/2016 CLINICAL DATA:  Fever  of unknown origin.  Altered mental status. EXAM: DIAGNOSTIC LUMBAR PUNCTURE UNDER FLUOROSCOPIC GUIDANCE FLUOROSCOPY TIME:  Fluoroscopy Time:  1 minutes PROCEDURE: Informed consent was obtained from the patient's son by phone prior to the procedure, including potential complications of headache, allergy, and pain. With the patient prone, the lower back was prepped with Betadine. 1% Lidocaine was used for local anesthesia. Lumbar puncture was performed at the L3-4 level using a 21 gauge needle with return of clear colorless CSF. 20 ml of CSF were obtained for laboratory studies. The patient tolerated the procedure well and there were no apparent complications. IMPRESSION: Lumbar puncture performed without complication. Laboratory results to follow. Electronically Signed   By: Lorriane Shire M.D.   On: 11/14/2016 10:08    Assessment/Plan  Generalized weakness Will have her work with physical therapy and occupational therapy team to help with gait training and muscle strengthening exercises.fall precautions. Skin care. Encourage to be out of bed.   Bladder tumor Right lateral wall bladder tumor S/p resection this hospital admission, pending pathology report, no further hematuria reported. Will need urology follow up  Renal stone Non obstructing, denies hematuria and dysuria, monitor clinically, maintain hydration  Chronic respiratory failure Interstitial changes to her lungs on imaging, hypoxia noted in hospital, currently on 2l oxygen by nasal canula and fluticasone-vilanterol daily. Wean off o2 as tolerated to room air. Completed antibiotic for presumed pneumonia. Making pulmonary follow up.  Dysphagia Continue dysphagia diet, SLP consult, aspiration precautions  Dementia with behavioral disturbance Has depression and anxiety, continue current duloxetine and clonazepam, provide supportive care. Continue seroquel for now. Continue donepezil and memantine. Will benefit from being a long term care  [facility] at [facility] after completion of rehabilitation given her increased needs with ADLs.  Hypothyroidism Continue her levothyroxine current regimen Lab Results  Component Value Date   TSH 0.533 11/13/2016   GAD Continue clonazepam 0.5 mg bid for now  OA Continue mobic daily as needed with tylenol 650 mg bid, monitor. Continue ca-vit d  Chronic depression Monitor her mood, continue duloxetine 30 mg daily and monitor  gerd Controlled symptom. Continue omeprazole 20 mg daily  Seizure disorder Seizure free, continue dilantin current regimen  Allergic rhinitis Continue her daily claritin    Family/ staff Communication: reviewed care plan with patient and charge nurse.    Labs/tests ordered: none  [name], MD Internal Medicine [facility] [address] [place], [place] [address] Cell Phone (Monday-Friday 8 am - 5 pm): [phone] On Call: [phone] and follow prompts after 5 pm and on weekends Office Phone: [phone] Office Fax: [phone]

## 2016-11-28 ENCOUNTER — Encounter: Payer: Self-pay | Admitting: Pulmonary Disease

## 2016-11-28 ENCOUNTER — Ambulatory Visit (INDEPENDENT_AMBULATORY_CARE_PROVIDER_SITE_OTHER): Payer: Medicare Other | Admitting: Pulmonary Disease

## 2016-11-28 ENCOUNTER — Other Ambulatory Visit (INDEPENDENT_AMBULATORY_CARE_PROVIDER_SITE_OTHER): Payer: Medicare Other

## 2016-11-28 VITALS — BP 118/60 | HR 72 | Ht 60.0 in | Wt 137.8 lb

## 2016-11-28 DIAGNOSIS — J849 Interstitial pulmonary disease, unspecified: Secondary | ICD-10-CM | POA: Diagnosis not present

## 2016-11-28 DIAGNOSIS — M1990 Unspecified osteoarthritis, unspecified site: Secondary | ICD-10-CM | POA: Insufficient documentation

## 2016-11-28 DIAGNOSIS — I82402 Acute embolism and thrombosis of unspecified deep veins of left lower extremity: Secondary | ICD-10-CM | POA: Diagnosis not present

## 2016-11-28 DIAGNOSIS — J9601 Acute respiratory failure with hypoxia: Secondary | ICD-10-CM

## 2016-11-28 LAB — C-REACTIVE PROTEIN: CRP: 1.1 mg/dL (ref 0.5–20.0)

## 2016-11-28 LAB — SEDIMENTATION RATE: SED RATE: 24 mm/h (ref 0–30)

## 2016-11-28 NOTE — Patient Instructions (Signed)
   Call or e-mail me if there are any new breathing problems or questions.  We will review the test results at your follow-up appointment.  TESTS ORDERED: 1. HRCT CHEST W/O 2. Serum ESR, CRP, ANA, Anti-CCP, & Rheumatoid Factor.

## 2016-11-28 NOTE — Addendum Note (Signed)
Addended by: Tyson Dense on: 11/28/2016 12:19 PM   Modules accepted: Orders

## 2016-11-28 NOTE — Progress Notes (Signed)
Subjective:    Patient ID: Rebecca [name], female    DOB: [birth year], 83 y.o.   MRN: [id number]  HPI Patient was hospitalized recently with a fever or unknown origin. She previously underwent a lumbar puncture and brain imaging. Ultimately she was found to have a bladder tumor that underwent resection. She was discharged from hospital. During that hospitalization she was on oxygen for hypoxia that seemed to resolve. Her hypoxia has progressively resolved with ambulation. Patient previously had been on oxygen a year ago. At that time she had a UTI and also pneumonia. She reports she feels "great". Denies any dyspnea. She did have a persistent cough for approximately 3 months this Winter that has since resolved. No audible wheezing. No chest pain or pressure. No recent fever or chills. No abdominal pain or nausea. No reflux or dyspepsia. No dysphagia or odynophagia.   Review of Systems No bruising. No rashes. She reports joint pain, primarily in her right hand. She also has chronic pain in her back. Pertinent review of systems is negative except as per the HPI.  Allergies  Allergen Reactions  . Macrodantin [Nitrofurantoin]   . Morphine And Related Other (See Comments)    Altered mental state   . Sulfa Antibiotics Other (See Comments)    Reaction: unknown    Current Outpatient Prescriptions on File Prior to Visit  Medication Sig Dispense Refill  . acetaminophen (TYLENOL) 325 MG tablet Take 650 mg by mouth 2 (two) times daily. Take 2 tablets twice daily     . Calcium Carbonate-Vitamin D (CALCIUM 600+D) 600-400 MG-UNIT tablet Take 1 tablet by mouth daily.    . Cholecalciferol (VITAMIN D3) 5000 units TABS Take 1 tablet by mouth daily. Take one tablet daily     . clonazePAM (KLONOPIN) 0.5 MG tablet Take one tablet by mouth 2 times a day at 8 am and 8 pm. 5 tablet 0  . donepezil (ARICEPT) 10 MG tablet Take 10 mg by mouth daily.    . DULoxetine (CYMBALTA) 30 MG capsule Take 30 mg by mouth daily.       . fluticasone furoate-vilanterol (BREO ELLIPTA) 100-25 MCG/INH AEPB Inhale 1 puff into the lungs daily.    Marland Kitchen levothyroxine (SYNTHROID, LEVOTHROID) 100 MCG tablet Take 1 tablet (100 mcg total) by mouth daily before breakfast. For thyroid 90 tablet 3  . loratadine (CLARITIN) 10 MG tablet Take 10 mg by mouth daily. Stop date 12/01/16    . meloxicam (MOBIC) 7.5 MG tablet Take 7.5 mg by mouth daily as needed for pain.     . memantine (NAMENDA) 10 MG tablet Take 10 mg by mouth 2 (two) times daily.    . Multiple Vitamins-Minerals (THERA M PLUS PO) Take 1 tablet by mouth daily.     . Omega-3 1000 MG CAPS Take 1 capsule by mouth daily. Take one capsule every morning     . omeprazole (PRILOSEC) 20 MG capsule Take 20 mg by mouth at bedtime.    . OXYGEN Inhale 2 L/min into the lungs continuous.    . phenytoin (DILANTIN) 100 MG ER capsule Take 100-200 mg by mouth See admin instructions. Take one capsule every morning; take 2 capsules at bedtime     . QUEtiapine (SEROQUEL) 25 MG tablet Take 12.5 mg by mouth daily. At 1 pm    . triamcinolone cream (KENALOG) 0.1 % Apply 1 application topically daily as needed (for rash).     . trolamine salicylate (ASPERCREME) 10 % cream Apply 1 application  topically. Apply as needed     No current facility-administered medications on file prior to visit.     Past Medical History:  Diagnosis Date  . Anemia, unspecified 03/18/2011  . Anxiety state, unspecified 01/2011  . Blood in stool 06/15/2012  . Corns and callosities 07/01/2011  . Diverticulosis 02/2011  . Dizziness   . DJD (degenerative joint disease)   . Dysuria 06/17/2011  . Hemorrhoids 02/2011  . Low back pain   . Mitral valve problem thickened   thickened  . Osteoarthritis of both hands 10/09/2015  . Osteoporosis 02/2011  . Other abnormal blood chemistry 0/30/2012  . Other acquired deformity of toe 12/16/2011  . Pain in joint, site unspecified 01/2012  . Personality change due to conditions classified elsewhere  01/2011  . Sacroiliitis, not elsewhere classified ([place]) 05/2011  . Seizures ([place])    Remotely  . Unspecified hypothyroidism 01/2011  . 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 Vitamin D deficiency 01/2011    Past Surgical History:  Procedure Laterality Date  . bletheroplasty    . CATARACT EXTRACTION W/ INTRAOCULAR LENS  IMPLANT, BILATERAL  2010  . CERVICAL LAMINECTOMY  2005  . CYSTOSCOPY WITH RETROGRADE PYELOGRAM, URETEROSCOPY AND STENT PLACEMENT Bilateral 11/16/2016   Procedure: CYSTOSCOPY WITH RETROGRADE PYELOGRAMS/CLOT EVACUATION;  Surgeon: Irine Seal, MD;  Location: [facility];  Service: Urology;  Laterality: Bilateral;  . HAMMER TOE SURGERY  2013   right 2nd toe Dr. Mallie Mussel  . IVC FILTER PLACEMENT ([facility] HX)    . JOINT REPLACEMENT Bilateral 2002 Right, 1992 Left   knees  . TRANSURETHRAL RESECTION OF BLADDER TUMOR N/A 11/16/2016   Procedure: TRANSURETHRAL RESECTION OF BLADDER TUMOR (TURBT);  Surgeon: Irine Seal, MD;  Location: [facility];  Service: Urology;  Laterality: N/A;    Family History  Problem Relation Age of Onset  . Alzheimer's disease Sister   . Emphysema Sister   . Emphysema Father   . Rheumatologic disease Neg Hx     Social History   Social History  . Marital status: Widowed    Spouse name: N/A  . Number of children: N/A  . Years of education: N/A   Social History Main Topics  . Smoking status: Former Smoker    Packs/day: 0.50    Years: 50.00    Quit date: 09/14/1988  . Smokeless tobacco: Never Used     Comment: Smoked <1ppd  . Alcohol use 0.6 oz/week    1 Glasses of wine per week     Comment: doesn't drink routinely & only on social occasions  . Drug use: No  . Sexual activity: No   Other Topics Concern  . None   Social History Narrative   Lives at [facility] since 10/07/2010, moved to [place] 01/16/2015   Widowed   Exercise class 3 times a week     Never smoked   Alcholol wine social    POA, Living Will, DNR      [place]  [facility]/CC:   Primary MPOA:  Christell Faith (Daughter) - [phone]   Secondary MPOA:  Shakeya Kerkman [facility]) - [phone]      [facility] (11/28/16):   Originally from [facility]. Lived in [place] for 2 years from 1960-1961. No pets currently. No bird or mold exposure. 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       Objective:   Physical Exam BP 118/60 (BP Location: Right Arm, Patient Position: Sitting, Cuff Size: Normal)   Pulse 72  Ht 5' (1.524 m)   Wt 137 lb 12.8 oz (62.5 kg)   SpO2 93%   BMI 26.91 kg/m  General:  Awake. Alert. No acute distress. Accompanied by staff from her facility as well as her son.  Integument:  Warm & dry. No rash on exposed skin.  Extremities:  No cyanosis or clubbing.  Lymphatics:  No appreciated cervical or supraclavicular lymphadenoapthy. HEENT:  Moist mucus membranes. No oral ulcers. No scleral injection or icterus.  Cardiovascular:  Regular rate. No edema. No appreciable JVD given body positioning.  Pulmonary:  Very mild bilateral basilar crackles. Symmetric chest wall expansion. No accessory muscle use on room air. Abdomen: Soft. Normal bowel sounds. Nondistended. Grossly nontender. Musculoskeletal:  Normal bulk and tone. Hand grip strength 4+ /5 bilaterally. No joint effusion appreciated. Synovial thickening of bilateral PIP and DIP joints with some deformity of the DIP joints. Neurological:  CN 2-12 grossly in tact. No meningismus. Moving all 4 extremities equally. Symmetric brachioradialis deep tendon reflexes.Extremely hard of hearing. Psychiatric:  Mood and affect congruent. Speech normal rhythm, rate & tone. Oriented to person, year, president, and place.  WALK TEST 11/28/16:  Walked 1.25 laps / Baseline Sat 99% on RA / Nadir Sat 84% on RA (requred 2 L/m to maintain with ambulation)  IMAGING CXR PA/LAT 11/15/16 (personally reviewed by me):  No pleural effusion appreciated. Heart normal in size & mediastinum normal in contour.  Low lung volumes. Bilateral increased interstitial markings and reticulation concerning for possible interstitial lung disease. No appreciable mass or opacity appreciated.  BILATERAL LOWER EXTREMITY VENOUS DUPLEX 04/11/16 (per radiology):  No DVT.  BILATERAL LOWER EXTREMITY VENOUS DUPLEX 03/28/15 (per vascular surgery):  Mobile thrombus (DVT) is noted in the left proximal femoral vein extending into the left common femoral vein. Also noted, occlusive deep vein thrombosis of bilateral posterior tibial, peroneal, and soleal veins.  CT CHEST W/O 03/16/15 (personally reviewed by me):  Diffuse patchy groundglass opacities with intralobular septal thickening and some suggestion of traction bronchiectasis versus consolidation with air bronchograms. Bilateral pleural effusions right greater than left. No obvious pleural thickening. No pericardial effusion. Questionable precarinal enlarged lymph nodes.  CARDIAC TTE (03/28/15):  LV normal in size with mild LVH. EF 60-65% without wall motion abnormality & grade 1 diastolic dysfunction. LA normal in size & RA mildly dilated. RV mildly dilated with preserved systolic function. No aortic stenosis or regurgitation. Aortic root normal in size. No mitral stenosis or regurgitation. No pulmonic regurgitation. Mild tricuspid regurgitation. No pericardial effusion.    Assessment & Plan:  83 y.o. female with history of left lower extremity DVT and prior pulmonary emboli. Reviewing her chest x-ray imaging there does appear to be some progression of the interstitial markings which likely reflects her underlying interstitial lung disease. There is no history of exposure or family history that would indicate a potential cause for her interstitial lung disease. However, she does have arthritis by admission and on physical exam that could possibly represent rheumatoid arthritis and be the etiology for her interstitial lung disease. We did briefly discuss immunosuppression for  treatment of interstitial lung diseases but I feel that most treatments she would be exposed to would potentially be of little benefit and have significant possible complications and adverse effects. I instructed the patient and her son to contact me if they have any further questions or concerns before her next appointment otherwise we will review her test results at that time.  1. Hypoxic respiratory failure:  Questionable acuteness. Prescribing the  patient oxygen at 2 L/m with exertion. 2. ILD:  Checking HRCT Chest w/o. Holding on further serum workup or PFTs.  3. DVT/PE:  Status post IVC filter. Not an anticoagulation candidate due to recurrent falls. 4. Arthritis:  Checking serum ESR, CRP, Anti-CCP & Rheumatoid Factor. 5. Follow-up:  Return to clinic in 8 weeks or sooner if needed.  Sonia Baller Ashok Cordia, M.D. [facility] Pager:  [phone] After 3pm or if no response, call [phone] 11:44 AM 11/28/16

## 2016-12-01 ENCOUNTER — Telehealth: Payer: Self-pay | Admitting: Pulmonary Disease

## 2016-12-01 LAB — RHEUMATOID FACTOR

## 2016-12-01 LAB — ANA: ANA: NEGATIVE

## 2016-12-01 LAB — CYCLIC CITRUL PEPTIDE ANTIBODY, IGG

## 2016-12-01 NOTE — Telephone Encounter (Signed)
Patient seen by JN on 11/28/16 - front office received unsigned DPR - this form is not valid without signature - shredded document -pr

## 2016-12-03 DIAGNOSIS — C672 Malignant neoplasm of lateral wall of bladder: Secondary | ICD-10-CM | POA: Diagnosis not present

## 2016-12-04 ENCOUNTER — Ambulatory Visit (INDEPENDENT_AMBULATORY_CARE_PROVIDER_SITE_OTHER)
Admission: RE | Admit: 2016-12-04 | Discharge: 2016-12-04 | Disposition: A | Discharge status: Home / Self Care | Payer: Medicare Other | Source: Ambulatory Visit | Attending: Pulmonary Disease | Admitting: Pulmonary Disease

## 2016-12-04 ENCOUNTER — Telehealth: Payer: Self-pay | Admitting: Pulmonary Disease

## 2016-12-04 DIAGNOSIS — J849 Interstitial pulmonary disease, unspecified: Secondary | ICD-10-CM

## 2016-12-04 DIAGNOSIS — K449 Diaphragmatic hernia without obstruction or gangrene: Secondary | ICD-10-CM | POA: Diagnosis not present

## 2016-12-04 NOTE — Telephone Encounter (Signed)
Received call from [place] with [facility] for a call report on pt's HRCT from 12/04/16, impression is below.    Dr. Ashok Cordia please advise. Thanks.     IMPRESSION: 1. Cavitary 3.2 cm spiculated lung mass in the peripheral lingula, suspicious for primary bronchogenic carcinoma. Recommend further characterization with PET-CT . 2. Mild AP window lymphadenopathy, not appreciably changed since 03/28/2015, suggesting benign reactive adenopathy, difficult to exclude nodal metastatic disease . 3. Basilar predominant fibrotic interstitial lung disease with possible early honeycombing, suspicious for usual interstitial pneumonia (UIP). Fibrotic phase nonspecific interstitial pneumonia (NSIP) is on the differential. Follow-up high-resolution chest CT may be obtained in 6-12 months to assess temporal pattern stability, as clinically warranted. 4. Small hiatal hernia. 5. Two vessel coronary atherosclerosis.  Aortic Atherosclerosis (ICD10-I70.0) and Emphysema (ICD10-J43.9).  These results will be called to the ordering clinician or representative by the Radiologist Assistant, and communication documented in the PACS or zVision Dashboard.   Electronically Signed   By: Ilona Sorrel M.D.   On: 12/04/2016 11:10

## 2016-12-05 ENCOUNTER — Non-Acute Institutional Stay (SKILLED_NURSING_FACILITY): Payer: Medicare Other | Admitting: Family

## 2016-12-05 ENCOUNTER — Encounter: Payer: Self-pay | Admitting: Family

## 2016-12-05 DIAGNOSIS — K219 Gastro-esophageal reflux disease without esophagitis: Secondary | ICD-10-CM | POA: Diagnosis not present

## 2016-12-05 DIAGNOSIS — M150 Primary generalized (osteo)arthritis: Secondary | ICD-10-CM

## 2016-12-05 DIAGNOSIS — M159 Polyosteoarthritis, unspecified: Secondary | ICD-10-CM

## 2016-12-05 DIAGNOSIS — F329 Major depressive disorder, single episode, unspecified: Secondary | ICD-10-CM | POA: Diagnosis not present

## 2016-12-05 DIAGNOSIS — E039 Hypothyroidism, unspecified: Secondary | ICD-10-CM

## 2016-12-05 DIAGNOSIS — J9611 Chronic respiratory failure with hypoxia: Secondary | ICD-10-CM | POA: Diagnosis not present

## 2016-12-05 DIAGNOSIS — F32A Depression, unspecified: Secondary | ICD-10-CM

## 2016-12-05 DIAGNOSIS — G40909 Epilepsy, unspecified, not intractable, without status epilepticus: Secondary | ICD-10-CM | POA: Diagnosis not present

## 2016-12-05 DIAGNOSIS — R2681 Unsteadiness on feet: Secondary | ICD-10-CM | POA: Diagnosis not present

## 2016-12-05 NOTE — Progress Notes (Signed)
Location:  [facility] Room Number: 34 Place of Service:  SNF (31)  Provider: Marlowe Sax FNP-C   PCP: [name], MD Patient Care Team: [name], MD as PCP - General (Internal Medicine) [name], [facility] [name], MD as Consulting Physician (Orthopedic Surgery) [name], MD as Consulting Physician (Physical Medicine and Rehabilitation) Melina Schools, MD as Consulting Physician (Orthopedic Surgery) [name], MD as Consulting Physician (Otolaryngology) Jordon Bourquin, [name], NP as Nurse Practitioner (Family Medicine)  Extended Emergency Contact Information Primary Emergency Contact: [name] Address: [address]          [name] of [place] Phone: [phone] Relation: Son Secondary Emergency Contact: [name] [place] of [place] Phone: [phone] Mobile Phone: [phone] Relation: Daughter  Code Status: DNR  Goals of care:  Advanced Directive information Advanced Directives 12/05/2016  Does Patient Have a Medical Advance Directive? Yes  Type of Advance Directive Out of facility DNR (pink MOST or yellow form);Living will;Healthcare Power of Attorney  Does patient want to make changes to medical advance directive? -  Copy of [place] in Chart? Yes  Pre-existing out of facility DNR order (yellow form or pink MOST form) -     Allergies  Allergen Reactions  . Macrodantin [Nitrofurantoin]   . Morphine And Related Other (See Comments)    Altered mental state   . Sulfa Antibiotics Other (See Comments)    Reaction: unknown    Chief Complaint  Patient presents with  . Discharge Note    discharge from [facility] SN[facility]     HPI:  83 y.o. female seen today at [facility] facility for discharge back to Assisted Living facility.She was here for short term rehabilitation for post hospital admission from 11/13/2016-11/17/2016 for Altered mental status and  fever. She was treated for sepsis of unknown etiology. Meningitis was rule out. Cultures were unrevealing and LP was negative therefore antibiotics was discontinued. CXR showed chronic interstitial lung disease with hypoxia.CT scan head was negative for acute intracranial abnormalities.CT scan of the ABD showed a nonobstructive kidney stone but more concerning was bladder density. Urology was consulted. She underwent cystoscopy with clot evacuation and bilateral retrograde pyelograms. She had a transurethral resection of medium bladder tumor.She has a medical history of Pulmonary embolism,Femoral DVT, Hypothyroidism,Seizures, OA, GERD,Depression, Anxiety among other conditions. She is seen in her room today. She denies any acute issues this visit.   She has had unremarkable stay here in rehab. She was seen by Pulmonology 11/28/2016 Dr. Tera Partridge for follow up hospitalization for respiratory failure with hypoxia.Pulmonology suspects possible rheumatoid arthritis could be the etiology for her interstitial lung disease due to no history of exposure or family history indicating interstitial lung disease. HRCT chest w/o, ESR, CRP, Anti CCP and Rheumatoid factor ordered.Patient to follow up in 8 weeks.She is awaiting for follow up appointment with Urology for Her hypoxia has improved currently off oxygen via nasal cannula.She has worked well with PT/OT now stable for discharge back to ALF.She does not require any new DME has own FWW.She will be discharge with her medication from SNF.Facility staff report no new concerns.   Past Medical History:  Diagnosis Date  . Anemia, unspecified 03/18/2011  . Anxiety state, unspecified 01/2011  . Blood in stool 06/15/2012  . Corns and callosities 07/01/2011  . Diverticulosis 02/2011  . Dizziness   . DJD (degenerative joint disease)   . Dysuria 06/17/2011  . Hemorrhoids 02/2011  .  Low back pain   . Mitral valve problem thickened   thickened  . Osteoarthritis of both  hands 10/09/2015  . Osteoporosis 02/2011  . Other abnormal blood chemistry 0/30/2012  . Other acquired deformity of toe 12/16/2011  . Pain in joint, site unspecified 01/2012  . 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 Sacroiliitis, not elsewhere classified ([place]) 05/2011  . Seizures ([place])    Remotely  . Unspecified hypothyroidism 01/2011  . 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 Vitamin D deficiency 01/2011    Past Surgical History:  Procedure Laterality Date  . bletheroplasty    . CATARACT EXTRACTION W/ INTRAOCULAR LENS  IMPLANT, BILATERAL  2010  . CERVICAL LAMINECTOMY  2005  . CYSTOSCOPY WITH RETROGRADE PYELOGRAM, URETEROSCOPY AND STENT PLACEMENT Bilateral 11/16/2016   Procedure: CYSTOSCOPY WITH RETROGRADE PYELOGRAMS/CLOT EVACUATION;  Surgeon: Irine Seal, MD;  Location: [facility];  Service: Urology;  Laterality: Bilateral;  . HAMMER TOE SURGERY  2013   right 2nd toe Dr. Mallie Mussel  . IVC FILTER PLACEMENT ([facility] HX)    . JOINT REPLACEMENT Bilateral 2002 Right, 1992 Left   knees  . TRANSURETHRAL RESECTION OF BLADDER TUMOR N/A 11/16/2016   Procedure: TRANSURETHRAL RESECTION OF BLADDER TUMOR (TURBT);  Surgeon: Irine Seal, MD;  Location: [facility];  Service: Urology;  Laterality: N/A;      reports that she quit smoking about 28 years ago. She has a 25.00 pack-year smoking history. She has never used smokeless tobacco. She reports that she drinks about 0.6 oz of alcohol per week . She reports that she does not use drugs. Social History   Social History  . Marital status: Widowed    Spouse name: N/A  . Number of children: N/A  . Years of education: N/A   Occupational History  . Not on file.   Social History Main Topics  . Smoking status: Former Smoker    Packs/day: 0.50    Years: 50.00    Quit date: 09/14/1988  . Smokeless tobacco: Never Used     Comment: Smoked <1ppd  . Alcohol use 0.6 oz/week    1 Glasses of wine per week     Comment:  doesn't drink routinely & only on social occasions  . Drug use: No  . Sexual activity: No   Other Topics Concern  . Not on file   Social History Narrative   Lives at [facility] since 10/07/2010, moved to [place] 01/16/2015   Widowed   Exercise class 3 times a week     Never smoked   Alcholol wine social    POA, Living Will, DNR      [place] [facility]/CC:   Primary MPOA:  Christell Faith (Daughter) - [phone]   Secondary MPOA:  [facility]) - [phone]       Pulmonary (11/28/16):   Originally from [facility]. Lived in [place] for 2 years from 1960-1961. No pets currently. No bird or mold exposure. 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     Allergies  Allergen Reactions  . Macrodantin [Nitrofurantoin]   . Morphine And Related Other (See Comments)    Altered mental state   . Sulfa Antibiotics Other (See Comments)    Reaction: unknown    Pertinent  Health Maintenance Due  Topic Date Due  . DEXA SCAN  05/19/2017 (Originally 11/19/1994)  . PNA vac Low Risk Adult (2 of 2 - PCV13) 05/19/2017 (Originally 05/23/2014)  . INFLUENZA VACCINE  12/17/2016  Medications: Allergies as of 12/05/2016      Reactions   Macrodantin [nitrofurantoin]    Morphine And Related Other (See Comments)   Altered mental state    Sulfa Antibiotics Other (See Comments)   Reaction: unknown      Medication List       Accurate as of 12/05/16  1:48 PM. Always use your most recent med list.          acetaminophen 325 MG tablet Commonly known as:  TYLENOL Take 650 mg by mouth 2 (two) times daily. Take 2 tablets twice daily   BREO ELLIPTA 100-25 MCG/INH Aepb Generic drug:  fluticasone furoate-vilanterol Inhale 1 puff into the lungs daily.   CALCIUM 600+D 600-400 MG-UNIT tablet Generic drug:  Calcium Carbonate-Vitamin D Take 1 tablet by mouth daily.   clonazePAM 0.5 MG tablet Commonly known as:  KLONOPIN Take one tablet by mouth 2 times a day at 8 am and 8 pm.     donepezil 10 MG tablet Commonly known as:  ARICEPT Take 10 mg by mouth daily.   DULoxetine 30 MG capsule Commonly known as:  CYMBALTA Take 30 mg by mouth daily.   levothyroxine 100 MCG tablet Commonly known as:  SYNTHROID, LEVOTHROID Take 1 tablet (100 mcg total) by mouth daily before breakfast. For thyroid   meloxicam 7.5 MG tablet Commonly known as:  MOBIC Take 7.5 mg by mouth daily as needed for pain.   memantine 10 MG tablet Commonly known as:  NAMENDA Take 10 mg by mouth 2 (two) times daily.   Omega-3 1000 MG Caps Take 1 capsule by mouth daily. Take one capsule every morning   omeprazole 20 MG capsule Commonly known as:  PRILOSEC Take 20 mg by mouth at bedtime.   phenytoin 100 MG ER capsule Commonly known as:  DILANTIN Take 100-200 mg by mouth See admin instructions. Take one capsule every morning; take 2 capsules at bedtime   QUEtiapine 25 MG tablet Commonly known as:  SEROQUEL Take 12.5 mg by mouth daily. At 1 pm   THERA M PLUS PO Take 1 tablet by mouth daily.   triamcinolone cream 0.1 % Commonly known as:  KENALOG Apply 1 application topically daily as needed (for rash).   trolamine salicylate 10 % cream Commonly known as:  ASPERCREME Apply 1 application topically. Apply as needed   Vitamin D3 5000 units Tabs Take 1 tablet by mouth daily. Take one tablet daily       Review of Systems  Constitutional: Negative for activity change, appetite change, chills, fatigue and fever.  HENT: Negative for congestion, rhinorrhea, sinus pain, sinus pressure, sneezing, sore throat and trouble swallowing.   Eyes: Negative for pain, discharge, redness and itching.  Respiratory: Negative for chest tightness, shortness of breath and wheezing.   Cardiovascular: Negative for chest pain, palpitations and leg swelling.  Gastrointestinal: Negative for abdominal distention, abdominal pain, constipation, diarrhea, nausea and vomiting.  Endocrine: Negative.   Genitourinary:  Negative for difficulty urinating, dysuria, flank pain, frequency, hematuria and urgency.  Musculoskeletal: Positive for arthralgias and gait problem.  Skin: Negative for color change, pallor, rash and wound.  Neurological: Negative for dizziness, seizures, syncope, light-headedness and headaches.  Hematological: Does not bruise/bleed easily.  Psychiatric/Behavioral: Negative for agitation, hallucinations and sleep disturbance. The patient is not nervous/anxious.        Pleasantly confused at her baseline    Vitals:   12/05/16 1013  BP: 98/67  Pulse: 75  Resp: 18  Temp: 98.2 F (  36.8 C)  SpO2: 94%  Weight: 137 lb 12.8 oz (62.5 kg)  Height: 5' (1.524 m)   Body mass index is 26.91 kg/m. Physical Exam  Constitutional:  Thin frail elderly in no acute distress   HENT:  Head: Normocephalic.  Mouth/Throat: Oropharynx is clear and moist. No oropharyngeal exudate.  Eyes: Pupils are equal, round, and reactive to light. Conjunctivae and EOM are normal. Right eye exhibits no discharge. Left eye exhibits no discharge. No scleral icterus.  Neck: Neck supple. No JVD present. No thyromegaly present.  Cardiovascular: Normal rate, regular rhythm, normal heart sounds and intact distal pulses.  Exam reveals no gallop and no friction rub.   No murmur heard. Pulmonary/Chest: Effort normal and breath sounds normal. No respiratory distress. She has no wheezes. She has no rales.  Abdominal: Soft. Bowel sounds are normal. She exhibits no distension. There is no tenderness. There is no rebound and no guarding.  Musculoskeletal: She exhibits no edema, tenderness or deformity.  Moves x 4 extremities. Unsteady gait uses FWW.   Lymphadenopathy:    She has no cervical adenopathy.  Neurological: She is alert. Coordination normal.  Pleasantly confused at her base line.   Skin: Skin is warm and dry. No rash noted. No erythema. No pallor.  Psychiatric: She has a normal mood and affect.   Labs reviewed: Basic  Metabolic Panel:  Recent Labs  11/13/16 1917 11/15/16 0347 11/17/16 0454 11/20/16  NA 136 142 140 140  K 4.0 3.5 3.6 4.3  CL 106 111 103  --   CO2 '25 26 31  ' --   GLUCOSE 123* 93 95  --   BUN '18 9 10 17  ' CREATININE 0.75 0.60 0.53 0.6  CALCIUM 8.2* 8.4* 8.5*  --    Liver Function Tests:  Recent Labs  03/05/16 1730 07/17/16 11/13/16 1917 11/15/16 0347  AST '27 21 24 20  ' ALT '19 11 17 17  ' ALKPHOS 82 77 74 54  BILITOT 0.5  --  0.3 0.7  PROT 6.4*  --  5.9* 5.4*  ALBUMIN 3.5  --  3.0* 2.7*   CBC:  Recent Labs  03/05/16 1730  11/13/16 1917 11/15/16 0347 11/16/16 1102 11/17/16 0454 11/20/16  WBC 5.9  < > 11.3* 9.3 8.0 6.6 7.0  NEUTROABS 3.9  --  9.5*  --   --   --   --   HGB 12.6  < > 12.7 11.3* 12.1 11.8* 12.4  HCT 39.4  < > 39.0 35.8* 37.6 36.3 36  MCV 99.5  --  97.7 98.6 97.7 96.0  --   PLT 173  < > 136* 127* 159 174 256  < > = values in this interval not displayed.   Recent Labs  11/15/16 0758 11/16/16 1146 11/17/16 0744  GLUCAP 83 143* 88   Assessment/Plan:    1. Chronic respiratory failure with hypoxia  Afebrile.has improved currently off oxygen via nasal cannula. No shortness of breath or wheezing.seen by Pulmonology 11/28/2016 Dr. Tera Partridge for follow up hospitalization for respiratory failure with hypoxia.Pulmonology suspects possible rheumatoid arthritis could be the etiology for her interstitial lung disease due to no history of exposure or family history indicating interstitial lung disease. HRCT chest w/o, ESR, CRP, Anti CCP and Rheumatoid factor ordered.Patient to follow up in 8 weeks.continue to monitor. Continue on Breo Ellipta.  2. Hypothyroidism TSH level 0.533 ( 11/13/2016). Continue on levothyroxine 100 mcg Tablet daily. Monitor TSH level.   3. Gastroesophageal reflux disease without esophagitis Stable. Continue on  Omeprazole 20 mg capsule at bedtime.Recheck mg level with next lab draw.   4. Seizure disorder No seizure activity reported.  Continue on Phenytoin 100 mg capsule in the morning and 200 mg Capsule at bedtime.will check dilantin level with next lab work.   5. Primary osteoarthritis involving multiple joints Continue on Asper creme, mobic 7.5 mg Tablet daily as needed.   6. Depression, unspecified depression type Stable.Continue on Duloxetine 30 mg Capsule daily.continue to monitor for mood changes.   7. Unstable gait  Has worked well with PT/ OT. Has own FWW. Fall and safety precautions.   Patient is being discharged with the following home health services:   None required.  Patient is being discharged with the following durable medical equipment:   -Has own [place]  Patient has been advised to f/u with their PCP in 1-2 weeks to for a transitions of care visit.Social services at their facility was responsible for arranging this appointment.  Pt  Will be discharge with medication from SNF.  Future labs/tests needed:  CBC, BMP in 1-2 weeks PCP

## 2016-12-09 DIAGNOSIS — M6281 Muscle weakness (generalized): Secondary | ICD-10-CM | POA: Diagnosis not present

## 2016-12-09 DIAGNOSIS — R29898 Other symptoms and signs involving the musculoskeletal system: Secondary | ICD-10-CM | POA: Diagnosis not present

## 2016-12-09 DIAGNOSIS — R296 Repeated falls: Secondary | ICD-10-CM | POA: Diagnosis not present

## 2016-12-09 DIAGNOSIS — R2681 Unsteadiness on feet: Secondary | ICD-10-CM | POA: Diagnosis not present

## 2016-12-09 DIAGNOSIS — R41841 Cognitive communication deficit: Secondary | ICD-10-CM | POA: Diagnosis not present

## 2016-12-09 DIAGNOSIS — M25552 Pain in left hip: Secondary | ICD-10-CM | POA: Diagnosis not present

## 2016-12-10 ENCOUNTER — Encounter: Payer: Self-pay | Admitting: Family

## 2016-12-10 ENCOUNTER — Non-Acute Institutional Stay: Payer: Medicare Other | Admitting: Family

## 2016-12-10 DIAGNOSIS — R2681 Unsteadiness on feet: Secondary | ICD-10-CM | POA: Diagnosis not present

## 2016-12-10 DIAGNOSIS — M150 Primary generalized (osteo)arthritis: Secondary | ICD-10-CM

## 2016-12-10 DIAGNOSIS — M6281 Muscle weakness (generalized): Secondary | ICD-10-CM | POA: Diagnosis not present

## 2016-12-10 DIAGNOSIS — R296 Repeated falls: Secondary | ICD-10-CM | POA: Diagnosis not present

## 2016-12-10 DIAGNOSIS — M25552 Pain in left hip: Secondary | ICD-10-CM | POA: Diagnosis not present

## 2016-12-10 DIAGNOSIS — R41841 Cognitive communication deficit: Secondary | ICD-10-CM | POA: Diagnosis not present

## 2016-12-10 DIAGNOSIS — R29898 Other symptoms and signs involving the musculoskeletal system: Secondary | ICD-10-CM | POA: Diagnosis not present

## 2016-12-10 DIAGNOSIS — M159 Polyosteoarthritis, unspecified: Secondary | ICD-10-CM

## 2016-12-11 DIAGNOSIS — R29898 Other symptoms and signs involving the musculoskeletal system: Secondary | ICD-10-CM | POA: Diagnosis not present

## 2016-12-11 DIAGNOSIS — R41841 Cognitive communication deficit: Secondary | ICD-10-CM | POA: Diagnosis not present

## 2016-12-11 DIAGNOSIS — M6281 Muscle weakness (generalized): Secondary | ICD-10-CM | POA: Diagnosis not present

## 2016-12-11 DIAGNOSIS — R296 Repeated falls: Secondary | ICD-10-CM | POA: Diagnosis not present

## 2016-12-11 DIAGNOSIS — M25552 Pain in left hip: Secondary | ICD-10-CM | POA: Diagnosis not present

## 2016-12-11 DIAGNOSIS — R2681 Unsteadiness on feet: Secondary | ICD-10-CM | POA: Diagnosis not present

## 2016-12-15 DIAGNOSIS — R2681 Unsteadiness on feet: Secondary | ICD-10-CM | POA: Diagnosis not present

## 2016-12-15 DIAGNOSIS — R41841 Cognitive communication deficit: Secondary | ICD-10-CM | POA: Diagnosis not present

## 2016-12-15 DIAGNOSIS — M6281 Muscle weakness (generalized): Secondary | ICD-10-CM | POA: Diagnosis not present

## 2016-12-15 DIAGNOSIS — R29898 Other symptoms and signs involving the musculoskeletal system: Secondary | ICD-10-CM | POA: Diagnosis not present

## 2016-12-15 DIAGNOSIS — R296 Repeated falls: Secondary | ICD-10-CM | POA: Diagnosis not present

## 2016-12-15 DIAGNOSIS — M25552 Pain in left hip: Secondary | ICD-10-CM | POA: Diagnosis not present

## 2016-12-16 DIAGNOSIS — R29898 Other symptoms and signs involving the musculoskeletal system: Secondary | ICD-10-CM | POA: Diagnosis not present

## 2016-12-16 DIAGNOSIS — R41841 Cognitive communication deficit: Secondary | ICD-10-CM | POA: Diagnosis not present

## 2016-12-16 DIAGNOSIS — R296 Repeated falls: Secondary | ICD-10-CM | POA: Diagnosis not present

## 2016-12-16 DIAGNOSIS — M6281 Muscle weakness (generalized): Secondary | ICD-10-CM | POA: Diagnosis not present

## 2016-12-16 DIAGNOSIS — R2681 Unsteadiness on feet: Secondary | ICD-10-CM | POA: Diagnosis not present

## 2016-12-16 DIAGNOSIS — M25552 Pain in left hip: Secondary | ICD-10-CM | POA: Diagnosis not present

## 2016-12-17 DIAGNOSIS — H52203 Unspecified astigmatism, bilateral: Secondary | ICD-10-CM | POA: Diagnosis not present

## 2016-12-17 DIAGNOSIS — H26491 Other secondary cataract, right eye: Secondary | ICD-10-CM | POA: Diagnosis not present

## 2016-12-17 DIAGNOSIS — H353131 Nonexudative age-related macular degeneration, bilateral, early dry stage: Secondary | ICD-10-CM | POA: Diagnosis not present

## 2016-12-18 DIAGNOSIS — R296 Repeated falls: Secondary | ICD-10-CM | POA: Diagnosis not present

## 2016-12-18 DIAGNOSIS — R2681 Unsteadiness on feet: Secondary | ICD-10-CM | POA: Diagnosis not present

## 2016-12-18 DIAGNOSIS — R41841 Cognitive communication deficit: Secondary | ICD-10-CM | POA: Diagnosis not present

## 2016-12-18 DIAGNOSIS — M6281 Muscle weakness (generalized): Secondary | ICD-10-CM | POA: Diagnosis not present

## 2016-12-18 DIAGNOSIS — R29898 Other symptoms and signs involving the musculoskeletal system: Secondary | ICD-10-CM | POA: Diagnosis not present

## 2016-12-18 DIAGNOSIS — M25552 Pain in left hip: Secondary | ICD-10-CM | POA: Diagnosis not present

## 2016-12-19 DIAGNOSIS — M25552 Pain in left hip: Secondary | ICD-10-CM | POA: Diagnosis not present

## 2016-12-19 DIAGNOSIS — R296 Repeated falls: Secondary | ICD-10-CM | POA: Diagnosis not present

## 2016-12-19 DIAGNOSIS — R41841 Cognitive communication deficit: Secondary | ICD-10-CM | POA: Diagnosis not present

## 2016-12-19 DIAGNOSIS — R2681 Unsteadiness on feet: Secondary | ICD-10-CM | POA: Diagnosis not present

## 2016-12-19 DIAGNOSIS — M6281 Muscle weakness (generalized): Secondary | ICD-10-CM | POA: Diagnosis not present

## 2016-12-19 DIAGNOSIS — R29898 Other symptoms and signs involving the musculoskeletal system: Secondary | ICD-10-CM | POA: Diagnosis not present

## 2016-12-22 ENCOUNTER — Other Ambulatory Visit: Payer: Self-pay | Admitting: Pulmonary Disease

## 2016-12-22 DIAGNOSIS — R41841 Cognitive communication deficit: Secondary | ICD-10-CM | POA: Diagnosis not present

## 2016-12-22 DIAGNOSIS — R2681 Unsteadiness on feet: Secondary | ICD-10-CM | POA: Diagnosis not present

## 2016-12-22 DIAGNOSIS — M6281 Muscle weakness (generalized): Secondary | ICD-10-CM | POA: Diagnosis not present

## 2016-12-22 DIAGNOSIS — R296 Repeated falls: Secondary | ICD-10-CM | POA: Diagnosis not present

## 2016-12-22 DIAGNOSIS — R29898 Other symptoms and signs involving the musculoskeletal system: Secondary | ICD-10-CM | POA: Diagnosis not present

## 2016-12-22 DIAGNOSIS — R9389 Abnormal findings on diagnostic imaging of other specified body structures: Secondary | ICD-10-CM

## 2016-12-22 DIAGNOSIS — M25552 Pain in left hip: Secondary | ICD-10-CM | POA: Diagnosis not present

## 2016-12-23 DIAGNOSIS — M6281 Muscle weakness (generalized): Secondary | ICD-10-CM | POA: Diagnosis not present

## 2016-12-23 DIAGNOSIS — R296 Repeated falls: Secondary | ICD-10-CM | POA: Diagnosis not present

## 2016-12-23 DIAGNOSIS — R41841 Cognitive communication deficit: Secondary | ICD-10-CM | POA: Diagnosis not present

## 2016-12-23 DIAGNOSIS — R29898 Other symptoms and signs involving the musculoskeletal system: Secondary | ICD-10-CM | POA: Diagnosis not present

## 2016-12-23 DIAGNOSIS — M25552 Pain in left hip: Secondary | ICD-10-CM | POA: Diagnosis not present

## 2016-12-23 DIAGNOSIS — R2681 Unsteadiness on feet: Secondary | ICD-10-CM | POA: Diagnosis not present

## 2016-12-24 ENCOUNTER — Non-Acute Institutional Stay: Payer: Medicare Other | Admitting: Family

## 2016-12-24 ENCOUNTER — Encounter: Payer: Self-pay | Admitting: Family

## 2016-12-24 DIAGNOSIS — M25562 Pain in left knee: Secondary | ICD-10-CM

## 2016-12-24 DIAGNOSIS — R2681 Unsteadiness on feet: Secondary | ICD-10-CM | POA: Diagnosis not present

## 2016-12-24 DIAGNOSIS — M25512 Pain in left shoulder: Secondary | ICD-10-CM

## 2016-12-24 DIAGNOSIS — R41841 Cognitive communication deficit: Secondary | ICD-10-CM | POA: Diagnosis not present

## 2016-12-24 DIAGNOSIS — M25561 Pain in right knee: Secondary | ICD-10-CM | POA: Diagnosis not present

## 2016-12-24 DIAGNOSIS — M25552 Pain in left hip: Secondary | ICD-10-CM | POA: Diagnosis not present

## 2016-12-24 DIAGNOSIS — G8929 Other chronic pain: Secondary | ICD-10-CM | POA: Diagnosis not present

## 2016-12-24 DIAGNOSIS — M6281 Muscle weakness (generalized): Secondary | ICD-10-CM | POA: Diagnosis not present

## 2016-12-24 DIAGNOSIS — R29898 Other symptoms and signs involving the musculoskeletal system: Secondary | ICD-10-CM | POA: Diagnosis not present

## 2016-12-24 DIAGNOSIS — R296 Repeated falls: Secondary | ICD-10-CM | POA: Diagnosis not present

## 2016-12-24 MED ORDER — ACETAMINOPHEN 500 MG PO TABS: 1000.0000 mg | ORAL_TABLET | Freq: Two times a day (BID) | ORAL | 0 refills | Status: AC

## 2016-12-25 DIAGNOSIS — M533 Sacrococcygeal disorders, not elsewhere classified: Secondary | ICD-10-CM | POA: Diagnosis not present

## 2016-12-25 DIAGNOSIS — M545 Low back pain: Secondary | ICD-10-CM | POA: Diagnosis not present

## 2016-12-25 NOTE — Progress Notes (Signed)
Location:  [facility] Room Number: 36 Place of Service:  ALF [phone]) Provider: Glynis Hunsucker FNP-C  [name], MD  Patient Care Team: [name], MD as PCP - General (Internal Medicine) [name], [facility] [name], MD as Consulting Physician (Orthopedic Surgery) [name], MD as Consulting Physician (Physical Medicine and Rehabilitation) Melina Schools, MD as Consulting Physician (Orthopedic Surgery) [name], MD as Consulting Physician (Otolaryngology) Siarra Gilkerson, [name], NP as Nurse Practitioner (Family Medicine)  Extended Emergency Contact Information Primary Emergency Contact: [name] Address: [address]          [name] Phone: [phone] Relation: Son Secondary Emergency Contact: [name] [place] Phone: [phone] Mobile Phone: [phone] Relation: Daughter  Code Status: DNR  Goals of care: Advanced Directive information Advanced Directives 12/24/2016  Does Patient Have a Medical Advance Directive? Yes  Type of Advance Directive Out of facility DNR (pink MOST or yellow form);Living will;Healthcare Power of Attorney  Does patient want to make changes to medical advance directive? -  Copy of [place] in Chart? Yes  Pre-existing out of facility DNR order (yellow form or pink MOST form) -     Chief Complaint  Patient presents with  . Acute Visit    Fall-knee and shoulder pain    HPI:  Pt is a 83 y.o. female seen today at [facility] for an acute visit for evaluation of left arm and knee pain post fall episode. She is seen in her room today. She states fell off her bed around 5:30 AM trying to get to the bathroom. She sustained injuries to left upper arm and hand. She also states tried crawling to the door to call for assistance but the knees were to painful due to previous surgeries. She managed to scoot on her bottom to the  door. She denies hitting her head or hips on the floor. She complains of soreness to left upper arm and knees. She denies any fever, chills, cough or urinary tract infections symptoms. Facility staff reports no new change in mental status.    Past Medical History:  Diagnosis Date  . Anemia, unspecified 03/18/2011  . Anxiety state, unspecified 01/2011  . Blood in stool 06/15/2012  . Corns and callosities 07/01/2011  . Diverticulosis 02/2011  . Dizziness   . DJD (degenerative joint disease)   . Dysuria 06/17/2011  . Hemorrhoids 02/2011  . Low back pain   . Mitral valve problem thickened   thickened  . Osteoarthritis of both hands 10/09/2015  . Osteoporosis 02/2011  . Other abnormal blood chemistry 0/30/2012  . Other acquired deformity of toe 12/16/2011  . Pain in joint, site unspecified 01/2012  . 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 Sacroiliitis, not elsewhere classified ([place]) 05/2011  . Seizures ([place])    Remotely  . Unspecified hypothyroidism 01/2011  . 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 Vitamin D deficiency 01/2011   Past Surgical History:  Procedure Laterality Date  . bletheroplasty    . CATARACT EXTRACTION W/ INTRAOCULAR LENS  IMPLANT, BILATERAL  2010  . CERVICAL LAMINECTOMY  2005  . CYSTOSCOPY WITH RETROGRADE PYELOGRAM, URETEROSCOPY AND STENT PLACEMENT Bilateral 11/16/2016   Procedure: CYSTOSCOPY WITH RETROGRADE PYELOGRAMS/CLOT EVACUATION;  Surgeon: Irine Seal, MD;  Location: [facility];  Service: Urology;  Laterality: Bilateral;  . HAMMER TOE SURGERY  2013   right 2nd toe Dr. Mallie Mussel  .  IVC FILTER PLACEMENT ([facility] HX)    . JOINT REPLACEMENT Bilateral 2002 Right, 1992 Left   knees  . TRANSURETHRAL RESECTION OF BLADDER TUMOR N/A 11/16/2016   Procedure: TRANSURETHRAL RESECTION OF BLADDER TUMOR (TURBT);  Surgeon: Irine Seal, MD;  Location: [facility];  Service: Urology;  Laterality: N/A;    Allergies  Allergen Reactions  .  Macrodantin [Nitrofurantoin]   . Morphine And Related Other (See Comments)    Altered mental state   . Sulfa Antibiotics Other (See Comments)    Reaction: unknown    Allergies as of 12/24/2016      Reactions   Macrodantin [nitrofurantoin]    Morphine And Related Other (See Comments)   Altered mental state    Sulfa Antibiotics Other (See Comments)   Reaction: unknown      Medication List       Accurate as of 12/24/16 11:59 PM. Always use your most recent med list.          acetaminophen 500 MG tablet Commonly known as:  TYLENOL Take 2 tablets (1,000 mg total) by mouth 2 (two) times daily. Then resume previous orders.   BREO ELLIPTA 100-25 MCG/INH Aepb Generic drug:  fluticasone furoate-vilanterol Inhale 1 puff into the lungs daily.   CALCIUM 600+D 600-400 MG-UNIT tablet Generic drug:  Calcium Carbonate-Vitamin D Take 1 tablet by mouth daily.   clonazePAM 0.5 MG tablet Commonly known as:  KLONOPIN Take one tablet by mouth 2 times a day at 8 am and 8 pm.   donepezil 10 MG tablet Commonly known as:  ARICEPT Take 10 mg by mouth daily.   DULoxetine 30 MG capsule Commonly known as:  CYMBALTA Take 30 mg by mouth daily.   levothyroxine 100 MCG tablet Commonly known as:  SYNTHROID, LEVOTHROID Take 1 tablet (100 mcg total) by mouth daily before breakfast. For thyroid   meloxicam 7.5 MG tablet Commonly known as:  MOBIC Take 7.5 mg by mouth daily as needed for pain.   memantine 10 MG tablet Commonly known as:  NAMENDA Take 10 mg by mouth 2 (two) times daily.   Omega-3 1000 MG Caps Take 1 capsule by mouth daily. Take one capsule every morning   omeprazole 20 MG capsule Commonly known as:  PRILOSEC Take 20 mg by mouth at bedtime.   OXYGEN Inhale 2 L/min into the lungs continuous.   phenytoin 100 MG ER capsule Commonly known as:  DILANTIN Take 100-200 mg by mouth See admin instructions. Take one capsule every morning; take 2 capsules at bedtime   QUEtiapine 25 MG  tablet Commonly known as:  SEROQUEL Take 12.5 mg by mouth daily. At 1 pm   THERA M PLUS PO Take 1 tablet by mouth daily.   triamcinolone cream 0.1 % Commonly known as:  KENALOG Apply 1 application topically daily as needed (for rash).   trolamine salicylate 10 % cream Commonly known as:  ASPERCREME Apply 1 application topically. Apply as needed   Vitamin D3 5000 units Tabs Take 1 tablet by mouth daily. Take one tablet daily       Review of Systems Constitutional: Negative for activity change, appetite change, chills, fatigue and fever.  HENT: Negative for congestion, rhinorrhea, sinus pain, sinus pressure, sneezing, sore throat and trouble swallowing.   Eyes: Negative for pain, discharge, redness and itching.  Respiratory: Negative for chest tightness, shortness of breath and wheezing.   Cardiovascular: Negative for chest pain, palpitations and leg swelling.  Gastrointestinal: Negative for abdominal distention, abdominal pain, constipation,  diarrhea, nausea and vomiting.    Genitourinary: Negative for difficulty urinating, dysuria, flank pain, frequency, hematuria and urgency.  Musculoskeletal: Positive for arthralgias and gait problem.  Skin: Bruises on left hand and arm.Negative for color change, pallor, rash and wound.  Neurological: Negative for dizziness, seizures, syncope, light-headedness and headaches.  Hematological:Has bruises on left hand and upper arm sustained from falling off the bed.Does not bleed easily.  Psychiatric/Behavioral: Negative for agitation, hallucinations and sleep disturbance. The patient is not nervous/anxious.        Pleasantly confused at her baseline   Immunization History  Administered Date(s) Administered  . Influenza Whole 03/17/2007, 02/16/2009, 01/30/2010, 02/16/2013  . Influenza-Unspecified 03/02/2014, 02/22/2015, 02/28/2016  . PPD Test 07/09/2010, 06/04/2015  . Pneumococcal Polysaccharide-23 05/19/2005, 05/23/2013  . Td 05/19/2005  .  Tetanus 10/06/2012   Pertinent  Health Maintenance Due  Topic Date Due  . INFLUENZA VACCINE  12/17/2016  . DEXA SCAN  05/19/2017 (Originally 11/19/1994)  . PNA vac Low Risk Adult (2 of 2 - PCV13) 05/19/2017 (Originally 05/23/2014)   Fall Risk  10/09/2015 07/10/2015 03/26/2015 12/19/2014 03/22/2013  Falls in the past year? No Yes Yes Yes Yes  Number falls in past yr: - 1 1 1 2  or more  Injury with Fall? - Yes No Yes -  Comment - scape on her left leg - hurt on left side and sacral bone -    Vitals:   12/24/16 1332  BP: 126/74  Pulse: 60  Resp: 18  Temp: 98.2 F (36.8 C)  SpO2: 98%  Weight: 138 lb 9.6 oz (62.9 kg)  Height: 5' (1.524 m)   Body mass index is 27.07 kg/m. Physical Exam Constitutional:  Thin frail elderly in no acute distress   HENT:  Head: Normocephalic.  Mouth/Throat: Oropharynx is clear and moist. No oropharyngeal exudate.  Eyes: Pupils are equal, round, and reactive to light. Conjunctivae and EOM are normal. Right eye exhibits no discharge. Left eye exhibits no discharge. No scleral icterus.  Neck: Neck supple. No JVD present. No thyromegaly present.  Cardiovascular: Normal rate, regular rhythm, normal heart sounds and intact distal pulses.  Exam reveals no gallop and no friction rub.   No murmur heard. Pulmonary/Chest: Effort normal and breath sounds normal. No respiratory distress. She has no wheezes. She has no rales.  Abdominal: Soft. Bowel sounds are normal. She exhibits no distension. There is no tenderness. There is no rebound and no guarding.  Musculoskeletal: She exhibits no edema, tenderness or deformity.  Moves x 4 extremities limited ROM to bilateral knees due to previous surgeries. Unsteady gait uses FWW.   Lymphadenopathy:    She has no cervical adenopathy.  Neurological: She is alert. Coordination normal.  Pleasantly confused at her base line.   Skin: Skin is warm and dry. No rash noted. No pallor.Left posterior upper arm purple bruise slight tender to  touch without any swelling. Moves arm without any difficulty. Bilateral knee small purple bruise non-tender to touch. Chronic limited ROM to both knees due to previous surgeries.  Psychiatric: She has a normal mood and affect.  Labs reviewed:  Recent Labs  11/13/16 1917 11/15/16 0347 11/17/16 0454 11/20/16  NA 136 142 140 140  K 4.0 3.5 3.6 4.3  CL 106 111 103  --   CO2 25 26 31   --   GLUCOSE 123* 93 95  --   BUN 18 9 10 17   CREATININE 0.75 0.60 0.53 0.6  CALCIUM 8.2* 8.4* 8.5*  --  Recent Labs  03/05/16 1730 07/17/16 11/13/16 1917 11/15/16 0347  AST 27 21 24 20   ALT 19 11 17 17   ALKPHOS 82 77 74 54  BILITOT 0.5  --  0.3 0.7  PROT 6.4*  --  5.9* 5.4*  ALBUMIN 3.5  --  3.0* 2.7*    Recent Labs  03/05/16 1730  11/13/16 1917 11/15/16 0347 11/16/16 1102 11/17/16 0454 11/20/16  WBC 5.9  < > 11.3* 9.3 8.0 6.6 7.0  NEUTROABS 3.9  --  9.5*  --   --   --   --   HGB 12.6  < > 12.7 11.3* 12.1 11.8* 12.4  HCT 39.4  < > 39.0 35.8* 37.6 36.3 36  MCV 99.5  --  97.7 98.6 97.7 96.0  --   PLT 173  < > 136* 127* 159 174 256  < > = values in this interval not displayed. Lab Results  Component Value Date   TSH 0.533 11/13/2016   Lab Results  Component Value Date   HGBA1C 5.4 03/08/2015   Lab Results  Component Value Date   CHOL 235 (H) 03/06/2009   HDL 108.40 03/06/2009   LDLDIRECT 108.2 03/06/2009    Significant Diagnostic Results in last 30 days:  Ct Chest High Resolution  Result Date: 12/04/2016 CLINICAL DATA:  Chronic dyspnea. Evaluate for interstitial lung disease. EXAM: CT CHEST WITHOUT CONTRAST TECHNIQUE: Multidetector CT imaging of the chest was performed following the standard protocol without intravenous contrast. High resolution imaging of the lungs, as well as inspiratory and expiratory imaging, was performed. COMPARISON:  03/28/2015 chest CT angiogram. 11/15/2016 chest radiograph. FINDINGS: Cardiovascular: Normal heart size. Stable trace pericardial  effusion/ thickening. Left anterior descending and left circumflex coronary atherosclerosis. Atherosclerotic nonaneurysmal thoracic aorta. Top-normal caliber pulmonary arteries (main pulmonary artery diameter 3.3 cm). Mediastinum/Nodes: No discrete thyroid nodules. Unremarkable esophagus. Stable mildly enlarged 1.0 cm AP window node (series 6/ image 48). No axillary adenopathy. No additional pathologically enlarged mediastinal or gross hilar nodes on this noncontrast scan. Lungs/Pleura: No pneumothorax. No pleural effusion. There is a partially cavitary spiculated 3.2 x 2.5 cm lingular mass (series 7/ image 66), which may represent interval growth of a 1.1 x 0.9 cm pulmonary nodule in this location on the 03/28/2015 chest CT study (which was obscured by surrounding opacity on the prior study). No additional lung masses or significant pulmonary nodules. Moderate centrilobular emphysema. There is extensive patchy confluent subpleural reticulation, subpleural lines and minimal ground-glass attenuation in both lungs with associated mild traction bronchiolectasis. There is a basilar gradient to these findings. There is possible early honeycombing in the dependent left lower lobe (series 7/ image 80). Comparison of these interstitial findings to the 03/28/2015 chest CT study is limited by motion artifact and superimposed consolidation on the prior scan. Extensive scattered coarse calcifications are noted throughout the periphery of both lungs. No significant air trapping on the expiration sequence. Upper abdomen: Small hiatal hernia. Simple 1.0 cm upper left renal cyst. Musculoskeletal: No aggressive appearing focal osseous lesions. Marked thoracic spondylosis. IMPRESSION: 1. Cavitary 3.2 cm spiculated lung mass in the peripheral lingula, suspicious for primary bronchogenic carcinoma. Recommend further characterization with PET-CT . 2. Mild AP window lymphadenopathy, not appreciably changed since 03/28/2015, suggesting  benign reactive adenopathy, difficult to exclude nodal metastatic disease . 3. Basilar predominant fibrotic interstitial lung disease with possible early honeycombing, suspicious for usual interstitial pneumonia (UIP). Fibrotic phase nonspecific interstitial pneumonia (NSIP) is on the differential. Follow-up high-resolution chest CT may be obtained in  6-12 months to assess temporal pattern stability, as clinically warranted. 4. Small hiatal hernia. 5. Two vessel coronary atherosclerosis. Aortic Atherosclerosis (ICD10-I70.0) and Emphysema (ICD10-J43.9). These results will be called to the ordering clinician or representative by the Radiologist Assistant, and communication documented in the PACS or zVision Dashboard. Electronically Signed   By: Ilona Sorrel M.D.   On: 12/04/2016 11:10   Assessment/Plan 1. Acute pain of left shoulder Status post fall bruise on upper side of hand and upper arm. Upper arm slight tender to touch but moves hand without any difficulty.No swelling noted. Suspect pain from fall but don't think she sustained any fracture to arm.Extra strength tylenol 1000 mg Tablet twice daily x 14 days then resume previous tylenol orders. Discussed with Nurse supervisor need to switch patient's private bed with a Hospital bed to enable patient to lower the bed closer to the floor due to frequent fall episode from current bed.Supervisor will discuss with POA then notify provider.Continue to monitor.   2. Chronic pain of both knees Bilateral knee small bruises from kneeling on the floor. No change in previous knee pain or ROM.tylenol as above. Continue to monitor.   Family/ staff Communication: Reviewed plan of care with patient and facility Nurse supervisor  Labs/tests ordered: None   Sandrea Hughs, NP

## 2016-12-26 DIAGNOSIS — M6281 Muscle weakness (generalized): Secondary | ICD-10-CM | POA: Diagnosis not present

## 2016-12-26 DIAGNOSIS — R296 Repeated falls: Secondary | ICD-10-CM | POA: Diagnosis not present

## 2016-12-26 DIAGNOSIS — R29898 Other symptoms and signs involving the musculoskeletal system: Secondary | ICD-10-CM | POA: Diagnosis not present

## 2016-12-26 DIAGNOSIS — R41841 Cognitive communication deficit: Secondary | ICD-10-CM | POA: Diagnosis not present

## 2016-12-26 DIAGNOSIS — R2681 Unsteadiness on feet: Secondary | ICD-10-CM | POA: Diagnosis not present

## 2016-12-26 DIAGNOSIS — M25552 Pain in left hip: Secondary | ICD-10-CM | POA: Diagnosis not present

## 2016-12-29 DIAGNOSIS — R2681 Unsteadiness on feet: Secondary | ICD-10-CM | POA: Diagnosis not present

## 2016-12-29 DIAGNOSIS — R41841 Cognitive communication deficit: Secondary | ICD-10-CM | POA: Diagnosis not present

## 2016-12-29 DIAGNOSIS — M6281 Muscle weakness (generalized): Secondary | ICD-10-CM | POA: Diagnosis not present

## 2016-12-29 DIAGNOSIS — R296 Repeated falls: Secondary | ICD-10-CM | POA: Diagnosis not present

## 2016-12-29 DIAGNOSIS — R29898 Other symptoms and signs involving the musculoskeletal system: Secondary | ICD-10-CM | POA: Diagnosis not present

## 2016-12-29 DIAGNOSIS — M25552 Pain in left hip: Secondary | ICD-10-CM | POA: Diagnosis not present

## 2016-12-30 DIAGNOSIS — R41841 Cognitive communication deficit: Secondary | ICD-10-CM | POA: Diagnosis not present

## 2016-12-30 DIAGNOSIS — R29898 Other symptoms and signs involving the musculoskeletal system: Secondary | ICD-10-CM | POA: Diagnosis not present

## 2016-12-30 DIAGNOSIS — M25552 Pain in left hip: Secondary | ICD-10-CM | POA: Diagnosis not present

## 2016-12-30 DIAGNOSIS — R296 Repeated falls: Secondary | ICD-10-CM | POA: Diagnosis not present

## 2016-12-30 DIAGNOSIS — M6281 Muscle weakness (generalized): Secondary | ICD-10-CM | POA: Diagnosis not present

## 2016-12-30 DIAGNOSIS — R2681 Unsteadiness on feet: Secondary | ICD-10-CM | POA: Diagnosis not present

## 2016-12-31 ENCOUNTER — Encounter (HOSPITAL_COMMUNITY): Admission: RE | Admit: 2016-12-31 | Payer: Medicare Other | Source: Ambulatory Visit

## 2016-12-31 DIAGNOSIS — R41841 Cognitive communication deficit: Secondary | ICD-10-CM | POA: Diagnosis not present

## 2016-12-31 DIAGNOSIS — M6281 Muscle weakness (generalized): Secondary | ICD-10-CM | POA: Diagnosis not present

## 2016-12-31 DIAGNOSIS — R29898 Other symptoms and signs involving the musculoskeletal system: Secondary | ICD-10-CM | POA: Diagnosis not present

## 2016-12-31 DIAGNOSIS — R2681 Unsteadiness on feet: Secondary | ICD-10-CM | POA: Diagnosis not present

## 2016-12-31 DIAGNOSIS — R296 Repeated falls: Secondary | ICD-10-CM | POA: Diagnosis not present

## 2016-12-31 DIAGNOSIS — M25552 Pain in left hip: Secondary | ICD-10-CM | POA: Diagnosis not present

## 2017-01-01 ENCOUNTER — Telehealth: Payer: Self-pay

## 2017-01-01 NOTE — Telephone Encounter (Signed)
All the notes have been routed but one cause Webb Silversmith still has one open from 12/10/16

## 2017-01-01 NOTE — Progress Notes (Signed)
Location:  [facility] Room Number: 36 Place of Service:  ALF [phone]) Provider: Dinah [name] FNP-C  [name], MD  Patient Care Team: [name], MD as PCP - General (Internal Medicine) [name], [facility] [name], MD as Consulting Physician (Orthopedic Surgery) [name], MD as Consulting Physician (Physical Medicine and Rehabilitation) Melina Schools, MD as Consulting Physician (Orthopedic Surgery) [name], MD as Consulting Physician (Otolaryngology) [name], [name], NP as Nurse Practitioner (Family Medicine)  Extended Emergency Contact Information Primary Emergency Contact: [name] Address: [address]          [name] of [place] Phone: [phone] Relation: Son Secondary Emergency Contact: [name] [place] of [place] Phone: [phone] Mobile Phone: [phone] Relation: Daughter  Code Status: DNR Goals of care: Advanced Directive information Advanced Directives 12/24/2016  Does Patient Have a Medical Advance Directive? Yes  Type of Advance Directive Out of facility DNR (pink MOST or yellow form);Living will;Healthcare Power of Attorney  Does patient want to make changes to medical advance directive? -  Copy of [place] in Chart? Yes  Pre-existing out of facility DNR order (yellow form or pink MOST form) -     Chief Complaint  Patient presents with  . Acute Visit    Needs assessment for lift chair for her room    HPI:  Pt is a Rebecca [name] seen today at [facility] for an acute visit for evaluation of need for lift chair. She has a significant medical history of Osteoarthritis, multiple falls among other conditions. She is seen in her room today.Facility Nurse reports patient having difficulty getting out of recliner. Patient would benefit from electric lift chair to assist into standing position from sitting. Patient able to operate remote  control without any difficulties.    Past Medical History:  Diagnosis Date  . Anemia, unspecified 03/18/2011  . Anxiety state, unspecified 01/2011  . Blood in stool 06/15/2012  . Corns and callosities 07/01/2011  . Diverticulosis 02/2011  . Dizziness   . DJD (degenerative joint disease)   . Dysuria 06/17/2011  . Hemorrhoids 02/2011  . Low back pain   . Mitral valve problem thickened   thickened  . Osteoarthritis of both hands 10/09/2015  . Osteoporosis 02/2011  . Other abnormal blood chemistry 0/30/2012  . Other acquired deformity of toe 12/16/2011  . Pain in joint, site unspecified 01/2012  . 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 Sacroiliitis, not elsewhere classified ([place]) 05/2011  . Seizures ([place])    Remotely  . Unspecified hypothyroidism 01/2011  . 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 Vitamin D deficiency 01/2011   Past Surgical History:  Procedure Laterality Date  . bletheroplasty    . CATARACT EXTRACTION W/ INTRAOCULAR LENS  IMPLANT, BILATERAL  2010  . CERVICAL LAMINECTOMY  2005  . CYSTOSCOPY WITH RETROGRADE PYELOGRAM, URETEROSCOPY AND STENT PLACEMENT Bilateral 11/16/2016   Procedure: CYSTOSCOPY WITH RETROGRADE PYELOGRAMS/CLOT EVACUATION;  Surgeon: Irine Seal, MD;  Location: [facility];  Service: Urology;  Laterality: Bilateral;  . HAMMER TOE SURGERY  2013   right 2nd toe Dr. Mallie Mussel  . IVC FILTER PLACEMENT ([facility] HX)    . JOINT REPLACEMENT Bilateral 2002 Right, 1992 Left   knees  . TRANSURETHRAL RESECTION OF BLADDER TUMOR N/A 11/16/2016   Procedure: TRANSURETHRAL RESECTION OF BLADDER TUMOR (TURBT);  Surgeon: Irine Seal, MD;  Location: [facility];  Service: Urology;  Laterality:  N/A;    Allergies  Allergen Reactions  . Macrodantin [Nitrofurantoin]   . Morphine And Related Other (See Comments)    Altered mental state   . Sulfa Antibiotics Other (See Comments)    Reaction: unknown    Allergies as of 12/10/2016       Reactions   Macrodantin [nitrofurantoin]    Morphine And Related Other (See Comments)   Altered mental state    Sulfa Antibiotics Other (See Comments)   Reaction: unknown      Medication List       Accurate as of 12/10/16 11:59 PM. Always use your most recent med list.          acetaminophen 325 MG tablet Commonly known as:  TYLENOL Take 650 mg by mouth 2 (two) times daily. Take 2 tablets twice daily   BREO ELLIPTA 100-25 MCG/INH Aepb Generic drug:  fluticasone furoate-vilanterol Inhale 1 puff into the lungs daily.   CALCIUM 600+D 600-400 MG-UNIT tablet Generic drug:  Calcium Carbonate-Vitamin D Take 1 tablet by mouth daily.   clonazePAM 0.5 MG tablet Commonly known as:  KLONOPIN Take one tablet by mouth 2 times a day at 8 am and 8 pm.   donepezil 10 MG tablet Commonly known as:  ARICEPT Take 10 mg by mouth daily.   DULoxetine 30 MG capsule Commonly known as:  CYMBALTA Take 30 mg by mouth daily.   levothyroxine 100 MCG tablet Commonly known as:  SYNTHROID, LEVOTHROID Take 1 tablet (100 mcg total) by mouth daily before breakfast. For thyroid   meloxicam 7.5 MG tablet Commonly known as:  MOBIC Take 7.5 mg by mouth daily as needed for pain.   memantine 10 MG tablet Commonly known as:  NAMENDA Take 10 mg by mouth 2 (two) times daily.   Omega-3 1000 MG Caps Take 1 capsule by mouth daily. Take one capsule every morning   omeprazole 20 MG capsule Commonly known as:  PRILOSEC Take 20 mg by mouth at bedtime.   phenytoin 100 MG ER capsule Commonly known as:  DILANTIN Take 100-200 mg by mouth See admin instructions. Take one capsule every morning; take 2 capsules at bedtime   QUEtiapine 25 MG tablet Commonly known as:  SEROQUEL Take 12.5 mg by mouth daily. At 1 pm   THERA M PLUS PO Take 1 tablet by mouth daily.   triamcinolone cream 0.1 % Commonly known as:  KENALOG Apply 1 application topically daily as needed (for rash).   trolamine salicylate 10 %  cream Commonly known as:  ASPERCREME Apply 1 application topically. Apply as needed   Vitamin D3 5000 units Tabs Take 1 tablet by mouth daily. Take one tablet daily       Review of Systems  Constitutional: Negative for activity change, appetite change, chills, fatigue and fever.  HENT: Negative for congestion, rhinorrhea, sinus pain, sinus pressure, sneezing, sore throat and trouble swallowing.   Eyes: Negative for pain, discharge, redness and itching.  Respiratory: Negative for cough, chest tightness, shortness of breath and wheezing.   Cardiovascular: Negative for chest pain, palpitations and leg swelling.  Gastrointestinal: Negative for abdominal distention, abdominal pain, constipation, diarrhea, nausea and vomiting.  Genitourinary: Negative for difficulty urinating, dysuria, flank pain, frequency, hematuria and urgency.  Musculoskeletal: Positive for arthralgias and gait problem.  Skin: Negative for color change, pallor, rash and wound.  Neurological: Negative for dizziness, seizures, syncope, light-headedness and headaches.  Hematological: Does not bruise/bleed easily.  Psychiatric/Behavioral: Negative for agitation, hallucinations and sleep disturbance. The  patient is not nervous/anxious.        Pleasantly confused at her baseline    Immunization History  Administered Date(s) Administered  . Influenza Whole 03/17/2007, 02/16/2009, 01/30/2010, 02/16/2013  . Influenza-Unspecified 03/02/2014, 02/22/2015, 02/28/2016  . PPD Test 07/09/2010, 06/04/2015  . Pneumococcal Polysaccharide-23 05/19/2005, 05/23/2013  . Td 05/19/2005  . Tetanus 10/06/2012   Pertinent  Health Maintenance Due  Topic Date Due  . INFLUENZA VACCINE  12/17/2016  . DEXA SCAN  05/19/2017 (Originally 11/19/1994)  . PNA vac Low Risk Adult (2 of 2 - PCV13) 05/19/2017 (Originally 05/23/2014)   Fall Risk  10/09/2015 07/10/2015 03/26/2015 12/19/2014 03/22/2013  Falls in the past year? No Yes Yes Yes Yes  Number falls in  past yr: - 1 1 1 2  or more  Injury with Fall? - Yes No Yes -  Comment - scape on her left leg - hurt on left side and sacral bone -    Vitals:   12/10/16 1009  BP: 118/64  Pulse: 78  Resp: 16  Temp: 98.7 F (37.1 C)  SpO2: 94%  Weight: 141 lb (64 kg)  Height: 5' (1.524 m)   Body mass index is 27.54 kg/m. Physical Exam  Constitutional:  Thin frail elderly in no acute distress   HENT:  Head: Normocephalic.  Mouth/Throat: Oropharynx is clear and moist. No oropharyngeal exudate.  Eyes: Pupils are equal, round, and reactive to light. Conjunctivae and EOM are normal. Right eye exhibits no discharge. Left eye exhibits no discharge. No scleral icterus.  Neck: Neck supple. No JVD present. No thyromegaly present.  Cardiovascular: Normal rate, regular rhythm, normal heart sounds and intact distal pulses.  Exam reveals no gallop and no friction rub.   No murmur heard. Pulmonary/Chest: Effort normal and breath sounds normal. No respiratory distress. She has no wheezes. She has no rales.  Abdominal: Soft. Bowel sounds are normal. She exhibits no distension. There is no tenderness. There is no rebound and no guarding.  Musculoskeletal: She exhibits no edema, tenderness or deformity.  Moves x 4 extremities except has arthritis changes to fingers.Unsteady gait uses FWW.    Lymphadenopathy:    She has no cervical adenopathy.  Neurological: She is alert. Coordination normal.  Pleasantly confused at her base line.   Skin: Skin is warm and dry. No rash noted. No erythema. No pallor.  Psychiatric: She has a normal mood and affect.   Labs reviewed:  Recent Labs  11/13/16 1917 11/15/16 0347 11/17/16 0454 11/20/16  NA 136 142 140 140  K 4.0 3.5 3.6 4.3  CL 106 111 103  --   CO2 25 26 31   --   GLUCOSE 123* 93 95  --   BUN 18 9 10 17   CREATININE 0.75 0.60 0.53 0.6  CALCIUM 8.2* 8.4* 8.5*  --     Recent Labs  03/05/16 1730 07/17/16 11/13/16 1917 11/15/16 0347  AST 27 21 24 20   ALT  19 11 17 17   ALKPHOS 82 77 74 54  BILITOT 0.5  --  0.3 0.7  PROT 6.4*  --  5.9* 5.4*  ALBUMIN 3.5  --  3.0* 2.7*    Recent Labs  03/05/16 1730  11/13/16 1917 11/15/16 0347 11/16/16 1102 11/17/16 0454 11/20/16  WBC 5.9  < > 11.3* 9.3 8.0 6.6 7.0  NEUTROABS 3.9  --  9.5*  --   --   --   --   HGB 12.6  < > 12.7 11.3* 12.1 11.8* 12.4  HCT 39.4  < >  39.0 35.8* 37.6 36.3 36  MCV 99.5  --  97.7 98.6 97.7 96.0  --   PLT 173  < > 136* 127* 159 174 256  < > = values in this interval not displayed. Lab Results  Component Value Date   TSH 0.533 11/13/2016   Lab Results  Component Value Date   HGBA1C 5.4 03/08/2015   Lab Results  Component Value Date   CHOL 235 (H) 03/06/2009   HDL 108.40 03/06/2009   LDLDIRECT 108.2 03/06/2009    Assessment/Plan 1. Primary osteoarthritis involving multiple joints  knees, hips,back and fingers. Continue on extra strength Tylenol 1000 mg tablet twice daily. Will benefit from an electric lift chair to assist in getting to standing position from sitting position.continue to monitor.Hard script written for an Electric lift chair.     2. Unsteady gait Uses FWW. Continue fall and safety precautions.   Family/ staff Communication: Reviewed plan of care with patient and facility Nurse supervisor.    Labs/tests ordered: None   Dinah C [name], NP

## 2017-01-01 NOTE — Telephone Encounter (Signed)
Robert with [employer] called requesting notes from November 2017-present that document need and use of oxygen.   Please fax notes to [phone], if you have questions please call [phone] EXT [phone]

## 2017-01-02 DIAGNOSIS — M25552 Pain in left hip: Secondary | ICD-10-CM | POA: Diagnosis not present

## 2017-01-02 DIAGNOSIS — M6281 Muscle weakness (generalized): Secondary | ICD-10-CM | POA: Diagnosis not present

## 2017-01-02 DIAGNOSIS — R29898 Other symptoms and signs involving the musculoskeletal system: Secondary | ICD-10-CM | POA: Diagnosis not present

## 2017-01-02 DIAGNOSIS — R296 Repeated falls: Secondary | ICD-10-CM | POA: Diagnosis not present

## 2017-01-02 DIAGNOSIS — R41841 Cognitive communication deficit: Secondary | ICD-10-CM | POA: Diagnosis not present

## 2017-01-02 DIAGNOSIS — R2681 Unsteadiness on feet: Secondary | ICD-10-CM | POA: Diagnosis not present

## 2017-01-05 ENCOUNTER — Non-Acute Institutional Stay: Payer: Medicare Other

## 2017-01-05 DIAGNOSIS — Z0000 Encounter for general adult medical examination without abnormal findings: Secondary | ICD-10-CM | POA: Diagnosis not present

## 2017-01-05 DIAGNOSIS — M25552 Pain in left hip: Secondary | ICD-10-CM | POA: Diagnosis not present

## 2017-01-05 DIAGNOSIS — R41841 Cognitive communication deficit: Secondary | ICD-10-CM | POA: Diagnosis not present

## 2017-01-05 DIAGNOSIS — R29898 Other symptoms and signs involving the musculoskeletal system: Secondary | ICD-10-CM | POA: Diagnosis not present

## 2017-01-05 DIAGNOSIS — M6281 Muscle weakness (generalized): Secondary | ICD-10-CM | POA: Diagnosis not present

## 2017-01-05 DIAGNOSIS — R2681 Unsteadiness on feet: Secondary | ICD-10-CM | POA: Diagnosis not present

## 2017-01-05 DIAGNOSIS — R296 Repeated falls: Secondary | ICD-10-CM | POA: Diagnosis not present

## 2017-01-05 NOTE — Patient Instructions (Signed)
Rebecca [name] , Thank you for taking time to come for your Medicare Wellness Visit. I appreciate your ongoing commitment to your health goals. Please review the following plan we discussed and let me know if I can assist you in the future.   Screening recommendations/referrals: Colonoscopy excluded, pt over age 81 Mammogram excluded, pt over age 72 Bone Density due Recommended yearly ophthalmology/optometry visit for glaucoma screening and checkup Recommended yearly dental visit for hygiene and checkup  Vaccinations: Influenza vaccine due 2018 fall season Pneumococcal vaccine 13 due Tdap vaccine due Shingles vaccine not in records  Advanced directives:DNR and Health care power of attorney in chart. Need living will   Conditions/risks identified: none  Next appointment: Dr. Bubba Camp makes rounds   Preventive Care 65 Years and Older, Female Preventive care refers to lifestyle choices and visits with your health care provider that can promote health and wellness. What does preventive care include?  A yearly physical exam. This is also called an annual well check.  Dental exams once or twice a year.  Routine eye exams. Ask your health care provider how often you should have your eyes checked.  Personal lifestyle choices, including:  Daily care of your teeth and gums.  Regular physical activity.  Eating a healthy diet.  Avoiding tobacco and drug use.  Limiting alcohol use.  Practicing safe sex.  Taking low-dose aspirin every day.  Taking vitamin and mineral supplements as recommended by your health care provider. What happens during an annual well check? The services and screenings done by your health care provider during your annual well check will depend on your age, overall health, lifestyle risk factors, and family history of disease. Counseling  Your health care provider may ask you questions about your:  Alcohol use.  Tobacco use.  Drug use.  Emotional  well-being.  Home and relationship well-being.  Sexual activity.  Eating habits.  History of falls.  Memory and ability to understand (cognition).  Work and work Statistician.  Reproductive health. Screening  You may have the following tests or measurements:  Height, weight, and BMI.  Blood pressure.  Lipid and cholesterol levels. These may be checked every 5 years, or more frequently if you are over 38 years old.  Skin check.  Lung cancer screening. You may have this screening every year starting at age 18 if you have a 30-pack-year history of smoking and currently smoke or have quit within the past 15 years.  Fecal occult blood test (FOBT) of the stool. You may have this test every year starting at age 64.  Flexible sigmoidoscopy or colonoscopy. You may have a sigmoidoscopy every 5 years or a colonoscopy every 10 years starting at age 32.  Hepatitis C blood test.  Hepatitis B blood test.  Sexually transmitted disease (STD) testing.  Diabetes screening. This is done by checking your blood sugar (glucose) after you have not eaten for a while (fasting). You may have this done every 1-3 years.  Bone density scan. This is done to screen for osteoporosis. You may have this done starting at age 33.  Mammogram. This may be done every 1-2 years. Talk to your health care provider about how often you should have regular mammograms. Talk with your health care provider about your test results, treatment options, and if necessary, the need for more tests. Vaccines  Your health care provider may recommend certain vaccines, such as:  Influenza vaccine. This is recommended every year.  Tetanus, diphtheria, and acellular pertussis (Tdap, Td) vaccine.  You may need a Td booster every 10 years.  Zoster vaccine. You may need this after age 22.  Pneumococcal 13-valent conjugate (PCV13) vaccine. One dose is recommended after age 76.  Pneumococcal polysaccharide (PPSV23) vaccine. One  dose is recommended after age 69. Talk to your health care provider about which screenings and vaccines you need and how often you need them. This information is not intended to replace advice given to you by your health care provider. Make sure you discuss any questions you have with your health care provider. Document Released: 06/01/2015 Document Revised: 01/23/2016 Document Reviewed: 03/06/2015 Elsevier Interactive Patient Education  2017 [place] Prevention in the Home Falls can cause injuries. They can happen to people of all ages. There are many things you can do to make your home safe and to help prevent falls. What can I do on the outside of my home?  Regularly fix the edges of walkways and driveways and fix any cracks.  Remove anything that might make you trip as you walk through a door, such as a raised step or threshold.  Trim any bushes or trees on the path to your home.  Use bright outdoor lighting.  Clear any walking paths of anything that might make someone trip, such as rocks or tools.  Regularly check to see if handrails are loose or broken. Make sure that both sides of any steps have handrails.  Any raised decks and porches should have guardrails on the edges.  Have any leaves, snow, or ice cleared regularly.  Use sand or salt on walking paths during winter.  Clean up any spills in your garage right away. This includes oil or grease spills. What can I do in the bathroom?  Use night lights.  Install grab bars by the toilet and in the tub and shower. Do not use towel bars as grab bars.  Use non-skid mats or decals in the tub or shower.  If you need to sit down in the shower, use a plastic, non-slip stool.  Keep the floor dry. Clean up any water that spills on the floor as soon as it happens.  Remove soap buildup in the tub or shower regularly.  Attach bath mats securely with double-sided non-slip rug tape.  Do not have throw rugs and other  things on the floor that can make you trip. What can I do in the bedroom?  Use night lights.  Make sure that you have a light by your bed that is easy to reach.  Do not use any sheets or blankets that are too big for your bed. They should not hang down onto the floor.  Have a firm chair that has side arms. You can use this for support while you get dressed.  Do not have throw rugs and other things on the floor that can make you trip. What can I do in the kitchen?  Clean up any spills right away.  Avoid walking on wet floors.  Keep items that you use a lot in easy-to-reach places.  If you need to reach something above you, use a strong step stool that has a grab bar.  Keep electrical cords out of the way.  Do not use floor polish or wax that makes floors slippery. If you must use wax, use non-skid floor wax.  Do not have throw rugs and other things on the floor that can make you trip. What can I do with my stairs?  Do not leave any  items on the stairs.  Make sure that there are handrails on both sides of the stairs and use them. Fix handrails that are broken or loose. Make sure that handrails are as long as the stairways.  Check any carpeting to make sure that it is firmly attached to the stairs. Fix any carpet that is loose or worn.  Avoid having throw rugs at the top or bottom of the stairs. If you do have throw rugs, attach them to the floor with carpet tape.  Make sure that you have a light switch at the top of the stairs and the bottom of the stairs. If you do not have them, ask someone to add them for you. What else can I do to help prevent falls?  Wear shoes that:  Do not have high heels.  Have rubber bottoms.  Are comfortable and fit you well.  Are closed at the toe. Do not wear sandals.  If you use a stepladder:  Make sure that it is fully opened. Do not climb a closed stepladder.  Make sure that both sides of the stepladder are locked into place.  Ask  someone to hold it for you, if possible.  Clearly mark and make sure that you can see:  Any grab bars or handrails.  First and last steps.  Where the edge of each step is.  Use tools that help you move around (mobility aids) if they are needed. These include:  Canes.  Walkers.  Scooters.  Crutches.  Turn on the lights when you go into a dark area. Replace any light bulbs as soon as they burn out.  Set up your furniture so you have a clear path. Avoid moving your furniture around.  If any of your floors are uneven, fix them.  If there are any pets around you, be aware of where they are.  Review your medicines with your doctor. Some medicines can make you feel dizzy. This can increase your chance of falling. Ask your doctor what other things that you can do to help prevent falls. This information is not intended to replace advice given to you by your health care provider. Make sure you discuss any questions you have with your health care provider. Document Released: 03/01/2009 Document Revised: 10/11/2015 Document Reviewed: 06/09/2014 Elsevier Interactive Patient Education  2017 [employer].

## 2017-01-05 NOTE — Progress Notes (Signed)
Subjective:   Rebecca [name] is a 83 y.o. female who presents for an Initial Medicare Annual Wellness Visit at [facility]        Objective:    Today's Vitals   01/05/17 1619  BP: (!) 110/58  Pulse: 70  Temp: 98.2 F (36.8 C)  TempSrc: Oral  SpO2: 92%  Weight: 139 lb (63 kg)  Height: 5' (1.524 m)   Body mass index is 27.15 kg/m.   Current Medications (verified) Outpatient Encounter Prescriptions as of 01/05/2017  Medication Sig  . acetaminophen (TYLENOL) 500 MG tablet Take 2 tablets (1,000 mg total) by mouth 2 (two) times daily. Then resume previous orders.  . Calcium Carbonate-Vitamin D (CALCIUM 600+D) 600-400 MG-UNIT tablet Take 1 tablet by mouth daily.  . Cholecalciferol (VITAMIN D3) 5000 units TABS Take 1 tablet by mouth daily. Take one tablet daily   . clonazePAM (KLONOPIN) 0.5 MG tablet Take one tablet by mouth 2 times a day at 8 am and 8 pm.  . donepezil (ARICEPT) 10 MG tablet Take 10 mg by mouth daily.  . DULoxetine (CYMBALTA) 30 MG capsule Take 30 mg by mouth daily.   . fluticasone furoate-vilanterol (BREO ELLIPTA) 100-25 MCG/INH AEPB Inhale 1 puff into the lungs daily.  Marland Kitchen levothyroxine (SYNTHROID, LEVOTHROID) 100 MCG tablet Take 1 tablet (100 mcg total) by mouth daily before breakfast. For thyroid  . meloxicam (MOBIC) 7.5 MG tablet Take 7.5 mg by mouth daily as needed for pain.   . memantine (NAMENDA) 10 MG tablet Take 10 mg by mouth 2 (two) times daily.  . Multiple Vitamins-Minerals (THERA M PLUS PO) Take 1 tablet by mouth daily.   . Omega-3 1000 MG CAPS Take 1 capsule by mouth daily. Take one capsule every morning   . omeprazole (PRILOSEC) 20 MG capsule Take 20 mg by mouth at bedtime.  . OXYGEN Inhale 2 L/min into the lungs continuous.  . phenytoin (DILANTIN) 100 MG ER capsule Take 100-200 mg by mouth See admin instructions. Take one capsule every morning; take 2 capsules at bedtime   . QUEtiapine (SEROQUEL) 25 MG tablet Take 12.5 mg by  mouth daily. At 1 pm  . triamcinolone cream (KENALOG) 0.1 % Apply 1 application topically daily as needed (for rash).   . trolamine salicylate (ASPERCREME) 10 % cream Apply 1 application topically. Apply as needed   No facility-administered encounter medications on file as of 01/05/2017.     Allergies (verified) Macrodantin [nitrofurantoin]; Morphine and related; and Sulfa antibiotics   History: Past Medical History:  Diagnosis Date  . Anemia, unspecified 03/18/2011  . Anxiety state, unspecified 01/2011  . Blood in stool 06/15/2012  . Corns and callosities 07/01/2011  . Diverticulosis 02/2011  . Dizziness   . DJD (degenerative joint disease)   . Dysuria 06/17/2011  . Hemorrhoids 02/2011  . Low back pain   . Mitral valve problem thickened   thickened  . Osteoarthritis of both hands 10/09/2015  . Osteoporosis 02/2011  . Other abnormal blood chemistry 0/30/2012  . Other acquired deformity of toe 12/16/2011  . Pain in joint, site unspecified 01/2012  . 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 Sacroiliitis, not elsewhere classified ([place]) 05/2011  . Seizures ([place])    Remotely  . Unspecified hypothyroidism 01/2011  . 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 Vitamin D deficiency 01/2011   Past Surgical History:  Procedure Laterality Date  . bletheroplasty    .  CATARACT EXTRACTION W/ INTRAOCULAR LENS  IMPLANT, BILATERAL  2010  . CERVICAL LAMINECTOMY  2005  . CYSTOSCOPY WITH RETROGRADE PYELOGRAM, URETEROSCOPY AND STENT PLACEMENT Bilateral 11/16/2016   Procedure: CYSTOSCOPY WITH RETROGRADE PYELOGRAMS/CLOT EVACUATION;  Surgeon: Irine Seal, MD;  Location: [facility];  Service: Urology;  Laterality: Bilateral;  . HAMMER TOE SURGERY  2013   right 2nd toe Dr. Mallie Mussel  . IVC FILTER PLACEMENT ([facility] HX)    . JOINT REPLACEMENT Bilateral 2002 Right, 1992 Left   knees  . TRANSURETHRAL RESECTION OF BLADDER TUMOR N/A 11/16/2016   Procedure: TRANSURETHRAL  RESECTION OF BLADDER TUMOR (TURBT);  Surgeon: Irine Seal, MD;  Location: [facility];  Service: Urology;  Laterality: N/A;   Family History  Problem Relation Age of Onset  . Alzheimer's disease Sister   . Emphysema Sister   . Emphysema Father   . Rheumatologic disease Neg Hx    Social History   Occupational History  . Not on file.   Social History Main Topics  . Smoking status: Former Smoker    Packs/day: 0.50    Years: 50.00    Quit date: 09/14/1988  . Smokeless tobacco: Never Used     Comment: Smoked <1ppd  . Alcohol use 0.6 oz/week    1 Glasses of wine per week     Comment: doesn't drink routinely & only on social occasions  . Drug use: No  . Sexual activity: No    Tobacco Counseling Counseling given: Not Answered   Activities of Daily Living In your present state of health, do you have any difficulty performing the following activities: 01/05/2017 11/14/2016  Hearing? Y N  Vision? N N  Difficulty concentrating or making decisions? Tempie Donning  Walking or climbing stairs? Y Y  Dressing or bathing? Y Y  Doing errands, shopping? Tempie Donning  Preparing Food and eating ? Y -  Using the Toilet? Y -  In the past six months, have you accidently leaked urine? Y -  Do you have problems with loss of bowel control? N -  Managing your Medications? Y -  Managing your Finances? Y -  Housekeeping or managing your Housekeeping? Y -  Some recent data might be hidden    Immunizations and Health Maintenance Immunization History  Administered Date(s) Administered  . Influenza Whole 03/17/2007, 02/16/2009, 01/30/2010, 02/16/2013  . Influenza-Unspecified 03/02/2014, 02/22/2015, 02/28/2016  . PPD Test 07/09/2010, 06/04/2015  . Pneumococcal Polysaccharide-23 05/19/2005, 05/23/2013  . Td 05/19/2005  . Tetanus 10/06/2012   Health Maintenance Due  Topic Date Due  . INFLUENZA VACCINE  12/17/2016    Patient Care Team: [name], MD as PCP - General (Internal Medicine) [name], [facility] [name], MD as Consulting Physician (Orthopedic Surgery) [name], MD as Consulting Physician (Physical Medicine and Rehabilitation) Melina Schools, MD as Consulting Physician (Orthopedic Surgery) [name], MD as Consulting Physician (Otolaryngology) [name], [name], NP as Nurse Practitioner (Family Medicine)  Indicate any recent [place] you may have received from other than [facility] providers in the past year (date may be approximate).     Assessment:   This is a routine wellness examination for Rebecca [name].   Hearing/Vision screen No exam data present  Dietary issues and exercise activities discussed: Current Exercise Habits: Structured exercise class, Type of exercise: Other - see comments (nursing home class), Time (Minutes): 30, Frequency (Times/Week): 5, Weekly Exercise (Minutes/Week): 150, Intensity: Mild, Exercise limited by: None identified  Goals    None     Depression Screen PHQ  2/9 Scores 01/05/2017 03/26/2015 12/19/2014  PHQ - 2 Score 0 2 0  PHQ- 9 Score - 5 -    Fall Risk Fall Risk  01/05/2017 10/09/2015 07/10/2015 03/26/2015 12/19/2014  Falls in the past year? No No Yes Yes Yes  Number falls in past yr: - - 1 1 1   Injury with Fall? - - Yes No Yes  Comment - - scape on her left leg - hurt on left side and sacral bone    Cognitive Function: MMSE - Mini Mental State Exam 01/05/2017 07/04/2014 04/04/2014 07/19/2013  Orientation to time 2 5 3 4   Orientation to Place 2 5 5 5   Registration 3 3 3 3   Attention/ Calculation 5 5 5 5   Recall 0 1 1 1   Language- name 2 objects 2 2 2 2   Language- repeat 1 1 0 1  Language- follow 3 step command 3 3 3 3   Language- read & follow direction 1 1 1 1   Write a sentence 1 1 1 1   Copy design 0 1 1 1   Total score 20 28 25 27         Screening Tests Health Maintenance  Topic Date Due  . INFLUENZA VACCINE  12/17/2016  . DEXA SCAN  05/19/2017 (Originally 11/19/1994)  . PNA vac Low Risk Adult (2 of 2 - PCV13) 05/19/2017  (Originally 05/23/2014)  . TETANUS/TDAP  10/07/2022      Plan:    I have personally reviewed and addressed the Medicare Annual Wellness questionnaire and have noted the following in the patient's chart:  A. Medical and social history B. Use of alcohol, tobacco or illicit drugs  C. Current medications and supplements D. Functional ability and status E.  Nutritional status F.  Physical activity G. Advance directives H. List of other physicians I.  Hospitalizations, surgeries, and ER visits in previous 12 months J.  [place] to include hearing, vision, cognitive, depression L. Referrals and appointments - none  In addition, I have reviewed and discussed with patient certain preventive protocols, quality metrics, and best practice recommendations. A written personalized care plan for preventive services as well as general preventive health recommendations were provided to patient.  See attached scanned questionnaire for additional information.   Signed,   Rich Reining, RN Nurse Health Advisor   Quick Notes   Health Maintenance: PNA 13, TDAP and DEXA due     Abnormal Screen: MMSE 20/30. Did not pass clock     Patient Concerns: None     Nurse Concerns: None

## 2017-01-06 DIAGNOSIS — R2681 Unsteadiness on feet: Secondary | ICD-10-CM | POA: Diagnosis not present

## 2017-01-06 DIAGNOSIS — M6281 Muscle weakness (generalized): Secondary | ICD-10-CM | POA: Diagnosis not present

## 2017-01-06 DIAGNOSIS — R296 Repeated falls: Secondary | ICD-10-CM | POA: Diagnosis not present

## 2017-01-06 DIAGNOSIS — R29898 Other symptoms and signs involving the musculoskeletal system: Secondary | ICD-10-CM | POA: Diagnosis not present

## 2017-01-06 DIAGNOSIS — M25552 Pain in left hip: Secondary | ICD-10-CM | POA: Diagnosis not present

## 2017-01-06 DIAGNOSIS — R41841 Cognitive communication deficit: Secondary | ICD-10-CM | POA: Diagnosis not present

## 2017-01-08 ENCOUNTER — Ambulatory Visit (HOSPITAL_COMMUNITY)
Admission: RE | Admit: 2017-01-08 | Discharge: 2017-01-08 | Disposition: A | Discharge status: Home / Self Care | Payer: Medicare Other | Source: Ambulatory Visit | Attending: Pulmonary Disease | Admitting: Pulmonary Disease

## 2017-01-08 DIAGNOSIS — J432 Centrilobular emphysema: Secondary | ICD-10-CM | POA: Insufficient documentation

## 2017-01-08 DIAGNOSIS — R938 Abnormal findings on diagnostic imaging of other specified body structures: Secondary | ICD-10-CM | POA: Insufficient documentation

## 2017-01-08 DIAGNOSIS — R911 Solitary pulmonary nodule: Secondary | ICD-10-CM | POA: Diagnosis not present

## 2017-01-08 DIAGNOSIS — R591 Generalized enlarged lymph nodes: Secondary | ICD-10-CM | POA: Insufficient documentation

## 2017-01-08 DIAGNOSIS — N200 Calculus of kidney: Secondary | ICD-10-CM | POA: Diagnosis not present

## 2017-01-08 DIAGNOSIS — I700 Atherosclerosis of aorta: Secondary | ICD-10-CM | POA: Diagnosis not present

## 2017-01-08 DIAGNOSIS — K5730 Diverticulosis of large intestine without perforation or abscess without bleeding: Secondary | ICD-10-CM | POA: Diagnosis not present

## 2017-01-08 DIAGNOSIS — R9389 Abnormal findings on diagnostic imaging of other specified body structures: Secondary | ICD-10-CM

## 2017-01-08 DIAGNOSIS — J849 Interstitial pulmonary disease, unspecified: Secondary | ICD-10-CM | POA: Diagnosis not present

## 2017-01-08 DIAGNOSIS — R918 Other nonspecific abnormal finding of lung field: Secondary | ICD-10-CM | POA: Diagnosis not present

## 2017-01-08 LAB — GLUCOSE, CAPILLARY: Glucose-Capillary: 99 mg/dL (ref 65–99)

## 2017-01-08 MED ORDER — FLUDEOXYGLUCOSE F - 18 (FDG) INJECTION
6.8000 | Freq: Once | INTRAVENOUS | Status: AC | PRN
  Administered 2017-01-08: 6.8 via INTRAVENOUS

## 2017-01-09 DIAGNOSIS — R29898 Other symptoms and signs involving the musculoskeletal system: Secondary | ICD-10-CM | POA: Diagnosis not present

## 2017-01-09 DIAGNOSIS — R296 Repeated falls: Secondary | ICD-10-CM | POA: Diagnosis not present

## 2017-01-09 DIAGNOSIS — R41841 Cognitive communication deficit: Secondary | ICD-10-CM | POA: Diagnosis not present

## 2017-01-09 DIAGNOSIS — M6281 Muscle weakness (generalized): Secondary | ICD-10-CM | POA: Diagnosis not present

## 2017-01-09 DIAGNOSIS — M25552 Pain in left hip: Secondary | ICD-10-CM | POA: Diagnosis not present

## 2017-01-09 DIAGNOSIS — R2681 Unsteadiness on feet: Secondary | ICD-10-CM | POA: Diagnosis not present

## 2017-01-12 DIAGNOSIS — R2681 Unsteadiness on feet: Secondary | ICD-10-CM | POA: Diagnosis not present

## 2017-01-12 DIAGNOSIS — R41841 Cognitive communication deficit: Secondary | ICD-10-CM | POA: Diagnosis not present

## 2017-01-12 DIAGNOSIS — R29898 Other symptoms and signs involving the musculoskeletal system: Secondary | ICD-10-CM | POA: Diagnosis not present

## 2017-01-12 DIAGNOSIS — M25552 Pain in left hip: Secondary | ICD-10-CM | POA: Diagnosis not present

## 2017-01-12 DIAGNOSIS — M6281 Muscle weakness (generalized): Secondary | ICD-10-CM | POA: Diagnosis not present

## 2017-01-12 DIAGNOSIS — R296 Repeated falls: Secondary | ICD-10-CM | POA: Diagnosis not present

## 2017-01-13 DIAGNOSIS — M6281 Muscle weakness (generalized): Secondary | ICD-10-CM | POA: Diagnosis not present

## 2017-01-13 DIAGNOSIS — R2681 Unsteadiness on feet: Secondary | ICD-10-CM | POA: Diagnosis not present

## 2017-01-13 DIAGNOSIS — R296 Repeated falls: Secondary | ICD-10-CM | POA: Diagnosis not present

## 2017-01-13 DIAGNOSIS — R29898 Other symptoms and signs involving the musculoskeletal system: Secondary | ICD-10-CM | POA: Diagnosis not present

## 2017-01-13 DIAGNOSIS — R41841 Cognitive communication deficit: Secondary | ICD-10-CM | POA: Diagnosis not present

## 2017-01-13 DIAGNOSIS — M25552 Pain in left hip: Secondary | ICD-10-CM | POA: Diagnosis not present

## 2017-01-15 ENCOUNTER — Telehealth: Payer: Self-pay | Admitting: Pulmonary Disease

## 2017-01-15 DIAGNOSIS — R918 Other nonspecific abnormal finding of lung field: Secondary | ICD-10-CM

## 2017-01-15 NOTE — Telephone Encounter (Signed)
Spoke with patient's son. Advised him that I will go ahead and order the biopsy. He verbalized understanding. Nothing else needed at time of call.

## 2017-01-15 NOTE — Telephone Encounter (Signed)
Yes. Please order a CT-guided biopsy of left lung mass. It should be sent for cytology as well as AFB smear with culture and Fungus smear with culture. Thank you.

## 2017-01-15 NOTE — Telephone Encounter (Signed)
Spoke with pt's son [name]- states that the family has discussed pt's care post PET and wishes to proceed with biopsy through IR.  JN ok to order?  Thanks!

## 2017-01-16 ENCOUNTER — Encounter: Payer: Self-pay | Admitting: Internal Medicine

## 2017-01-16 DIAGNOSIS — M25552 Pain in left hip: Secondary | ICD-10-CM | POA: Diagnosis not present

## 2017-01-16 DIAGNOSIS — R41841 Cognitive communication deficit: Secondary | ICD-10-CM | POA: Diagnosis not present

## 2017-01-16 DIAGNOSIS — R2681 Unsteadiness on feet: Secondary | ICD-10-CM | POA: Diagnosis not present

## 2017-01-16 DIAGNOSIS — R29898 Other symptoms and signs involving the musculoskeletal system: Secondary | ICD-10-CM | POA: Diagnosis not present

## 2017-01-16 DIAGNOSIS — M6281 Muscle weakness (generalized): Secondary | ICD-10-CM | POA: Diagnosis not present

## 2017-01-16 DIAGNOSIS — R296 Repeated falls: Secondary | ICD-10-CM | POA: Diagnosis not present

## 2017-01-19 ENCOUNTER — Emergency Department (HOSPITAL_COMMUNITY)
Admission: EM | Admit: 2017-01-19 | Discharge: 2017-01-19 | Disposition: A | Discharge status: Home / Self Care | Payer: Medicare Other | Attending: Emergency Medicine | Admitting: Emergency Medicine

## 2017-01-19 ENCOUNTER — Encounter (HOSPITAL_COMMUNITY): Payer: Self-pay | Admitting: Emergency Medicine

## 2017-01-19 ENCOUNTER — Emergency Department (HOSPITAL_COMMUNITY): Payer: Medicare Other

## 2017-01-19 DIAGNOSIS — Z87891 Personal history of nicotine dependence: Secondary | ICD-10-CM | POA: Insufficient documentation

## 2017-01-19 DIAGNOSIS — E039 Hypothyroidism, unspecified: Secondary | ICD-10-CM | POA: Diagnosis not present

## 2017-01-19 DIAGNOSIS — S3690XA Unspecified injury of unspecified intra-abdominal organ, initial encounter: Secondary | ICD-10-CM | POA: Diagnosis not present

## 2017-01-19 DIAGNOSIS — R079 Chest pain, unspecified: Secondary | ICD-10-CM | POA: Diagnosis not present

## 2017-01-19 DIAGNOSIS — R0789 Other chest pain: Secondary | ICD-10-CM | POA: Insufficient documentation

## 2017-01-19 DIAGNOSIS — F0390 Unspecified dementia without behavioral disturbance: Secondary | ICD-10-CM | POA: Insufficient documentation

## 2017-01-19 DIAGNOSIS — Z79899 Other long term (current) drug therapy: Secondary | ICD-10-CM | POA: Diagnosis not present

## 2017-01-19 DIAGNOSIS — M25552 Pain in left hip: Secondary | ICD-10-CM | POA: Diagnosis not present

## 2017-01-19 DIAGNOSIS — M6281 Muscle weakness (generalized): Secondary | ICD-10-CM | POA: Diagnosis not present

## 2017-01-19 DIAGNOSIS — M545 Low back pain: Secondary | ICD-10-CM | POA: Diagnosis not present

## 2017-01-19 DIAGNOSIS — R2681 Unsteadiness on feet: Secondary | ICD-10-CM | POA: Diagnosis not present

## 2017-01-19 DIAGNOSIS — R41841 Cognitive communication deficit: Secondary | ICD-10-CM | POA: Diagnosis not present

## 2017-01-19 DIAGNOSIS — Z96653 Presence of artificial knee joint, bilateral: Secondary | ICD-10-CM | POA: Insufficient documentation

## 2017-01-19 DIAGNOSIS — R100 Acute abdomen: Secondary | ICD-10-CM | POA: Diagnosis not present

## 2017-01-19 NOTE — ED Notes (Signed)
Patient transported to X-ray 

## 2017-01-19 NOTE — ED Notes (Signed)
Bed: WA06 Expected date:  Expected time:  Means of arrival:  Comments: EMS-RUQ pain/dementia

## 2017-01-19 NOTE — ED Triage Notes (Addendum)
Per EMS, pt from [place], c/o R rib pain which started today while exercising. Pt with hx of dementia. EMS reports tenderness to R ribs.

## 2017-01-21 ENCOUNTER — Non-Acute Institutional Stay: Payer: Medicare Other | Admitting: Family

## 2017-01-21 ENCOUNTER — Encounter: Payer: Self-pay | Admitting: Family

## 2017-01-21 DIAGNOSIS — R079 Chest pain, unspecified: Secondary | ICD-10-CM | POA: Diagnosis not present

## 2017-01-23 ENCOUNTER — Ambulatory Visit: Payer: Self-pay | Admitting: Pulmonary Disease

## 2017-01-23 NOTE — ED Provider Notes (Signed)
[facility] Provider Note   CSN: [id number] Arrival date & time: 01/19/17  1046     History   Chief Complaint Chief Complaint  Patient presents with  . Rib Injury   Level V caveat: Dementia. HPI Rebecca [name] is a 83 y.o. female.  HPI Patient ports she developed acute right-sided anterior and anterolateral chest discomfort today.  EMS felt as though her right side of her ribs were tender.  The patient's son was present reports that she recurrently complains of right-sided chest discomfort.  He states she's otherwise been in her normal health and acting normally.  No fevers or chills.  No vomiting.  Family has noted no rash.    Past Medical History:  Diagnosis Date  . Anemia, unspecified 03/18/2011  . Anxiety state, unspecified 01/2011  . Blood in stool 06/15/2012  . Corns and callosities 07/01/2011  . Diverticulosis 02/2011  . Dizziness   . DJD (degenerative joint disease)   . Dysuria 06/17/2011  . Hemorrhoids 02/2011  . Low back pain   . Mitral valve problem thickened   thickened  . Osteoarthritis of both hands 10/09/2015  . Osteoporosis 02/2011  . Other abnormal blood chemistry 0/30/2012  . Other acquired deformity of toe 12/16/2011  . Pain in joint, site unspecified 01/2012  . 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 Sacroiliitis, not elsewhere classified ([place]) 05/2011  . Seizures ([place])    Remotely  . Unspecified hypothyroidism 01/2011  . 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 Vitamin D deficiency 01/2011    Patient Active Problem List   Diagnosis Date Noted  . ILD (interstitial lung disease) ([place]) 11/28/2016  . Arthritis 11/28/2016  . Gross hematuria 11/16/2016  . GERD (gastroesophageal reflux disease) 05/20/2016  . Torus palatinus 04/18/2016  . Osteoarthritis, multiple sites 10/09/2015  . Back pain 06/01/2015  . Unstable gait 04/16/2015  . Abnormal finding on EKG-new inferior/ septal TWI  03/28/2015  . DNR no code (do not resuscitate) 03/28/2015  . DVT, femoral, acute ([place]) 03/28/2015  . Chronic pulmonary embolism ([place]) 03/28/2015  . Dyslipidemia 03/12/2015  . Seizure disorder ([place]) 03/11/2015  . Depression 09/26/2014  . Dementia with behavioral disturbance 09/19/2012  . Hypothyroidism 12/08/2006    Past Surgical History:  Procedure Laterality Date  . bletheroplasty    . CATARACT EXTRACTION W/ INTRAOCULAR LENS  IMPLANT, BILATERAL  2010  . CERVICAL LAMINECTOMY  2005  . CYSTOSCOPY WITH RETROGRADE PYELOGRAM, URETEROSCOPY AND STENT PLACEMENT Bilateral 11/16/2016   Procedure: CYSTOSCOPY WITH RETROGRADE PYELOGRAMS/CLOT EVACUATION;  Surgeon: Irine Seal, MD;  Location: [facility];  Service: Urology;  Laterality: Bilateral;  . HAMMER TOE SURGERY  2013   right 2nd toe Dr. Mallie Mussel  . IVC FILTER PLACEMENT ([facility] HX)    . JOINT REPLACEMENT Bilateral 2002 Right, 1992 Left   knees  . TRANSURETHRAL RESECTION OF BLADDER TUMOR N/A 11/16/2016   Procedure: TRANSURETHRAL RESECTION OF BLADDER TUMOR (TURBT);  Surgeon: Irine Seal, MD;  Location: [facility];  Service: Urology;  Laterality: N/A;    OB History    No data available       Home Medications    Prior to Admission medications   Medication Sig Start Date End Date Taking? Authorizing Provider  acetaminophen (TYLENOL) 325 MG tablet Take 650 mg by mouth every 6 (six) hours as needed for moderate pain.   Yes [provider]  Calcium Carbonate-Vitamin D (CALCIUM 600+D) 600-400 MG-UNIT tablet Take 1 tablet by mouth  daily.   Yes [provider]  Cholecalciferol (VITAMIN D3) 5000 units TABS Take 1 tablet by mouth daily. Take one tablet daily    Yes [provider]  clonazePAM (KLONOPIN) 0.5 MG tablet Take one tablet by mouth 2 times a day at 8 am and 8 pm. 11/17/16  Yes Florencia Reasons, MD  donepezil (ARICEPT) 10 MG tablet Take 10 mg by mouth daily.   Yes [provider]  DULoxetine (CYMBALTA) 30 MG capsule Take 30 mg  by mouth daily.    Yes [provider]  fluticasone furoate-vilanterol (BREO ELLIPTA) 100-25 MCG/INH AEPB Inhale 1 puff into the lungs daily.   Yes [provider]  levothyroxine (SYNTHROID, LEVOTHROID) 100 MCG tablet Take 1 tablet (100 mcg total) by mouth daily before breakfast. For thyroid 09/28/14  Yes Estill Dooms, MD  meloxicam (MOBIC) 7.5 MG tablet Take 7.5 mg by mouth daily as needed for pain.    Yes [provider]  memantine (NAMENDA) 10 MG tablet Take 10 mg by mouth 2 (two) times daily.   Yes [provider]  Multiple Vitamins-Minerals (THERA M PLUS PO) Take 1 tablet by mouth daily.    Yes [provider]  Omega-3 1000 MG CAPS Take 1 capsule by mouth daily. Take one capsule every morning    Yes [provider]  omeprazole (PRILOSEC) 20 MG capsule Take 20 mg by mouth at bedtime.   Yes [provider]  phenytoin (DILANTIN) 100 MG ER capsule Take 100-200 mg by mouth See admin instructions. Take one capsule every morning; take 2 capsules at bedtime    Yes [provider]  QUEtiapine (SEROQUEL) 25 MG tablet Take 12.5 mg by mouth daily. At 1 pm   Yes [provider]  triamcinolone cream (KENALOG) 0.1 % Apply 1 application topically daily as needed (for rash).  03/06/16  Yes [provider]  trolamine salicylate (ASPERCREME) 10 % cream Apply 1 application topically. Apply as needed   Yes [provider]    Family History Family History  Problem Relation Age of Onset  . Alzheimer's disease Sister   . Emphysema Sister   . Emphysema Father   . Rheumatologic disease Neg Hx     Social History Social History  Substance Use Topics  . Smoking status: Former Smoker    Packs/day: 0.50    Years: 50.00    Quit date: 09/14/1988  . Smokeless tobacco: Never Used     Comment: Smoked <1ppd  . Alcohol use 0.6 oz/week    1 Glasses of wine per week     Comment: doesn't drink routinely & only on social  occasions     Allergies   Macrodantin [nitrofurantoin]; Morphine and related; and Sulfa antibiotics   Review of Systems Review of Systems  Unable to perform ROS: Dementia     Physical Exam Updated Vital Signs BP 128/89   Pulse (!) 59   Temp (!) 97.5 F (36.4 C) (Oral)   Resp 16   SpO2 92%   Physical Exam  Constitutional: She is oriented to person, place, and time. She appears well-developed and well-nourished. No distress.  HENT:  Head: Normocephalic and atraumatic.  Eyes: EOM are normal.  Neck: Normal range of motion.  Cardiovascular: Normal rate and regular rhythm.   Pulmonary/Chest: Effort normal and breath sounds normal.  Mild right-sided chest tenderness without crepitus.  No rash  Abdominal: Soft. She exhibits no distension. There is no tenderness.  Musculoskeletal: Normal range of motion.  Neurological: She is alert and oriented to person, place, and time.  Skin: Skin is warm and dry.  Psychiatric: She has a normal mood and affect. Judgment normal.  Nursing note and vitals reviewed.    ED Treatments / Results  Labs (all labs ordered are listed, but only abnormal results are displayed) Labs Reviewed - No data to display  EKG  EKG Interpretation None       Radiology No results found.  Procedures Procedures (including critical care time)  Medications Ordered in ED Medications - No data to display   Initial Impression / Assessment and Plan / ED Course  I have reviewed the triage vital signs and the nursing notes.  Pertinent labs & imaging results that were available during my care of the patient were reviewed by me and considered in my medical decision making (see chart for details).     Patient is overall well-appearing.  The patient's son is present and is interested in a minimal workup given the patient's advanced age and advanced dementia.  I think this is reasonable.  Chest x-ray is clear.  Labs without significant abnormality.   Recommended anti-inflammatories at home and close primary care follow-up.  On recent imaging of her chest patient has evidence of an area in her lingular region suspicious for primary bronchogenic carcinoma.  She also has an 11 mm hypermetabolic right upper lobe pulmonary nodule.  Son is aware of this and will continue to follow-up with her oncology team regarding these new findings.  Final Clinical Impressions(s) / ED Diagnoses   Final diagnoses:  Chest wall pain    New Prescriptions Discharge Medication List as of 01/19/2017  1:12 PM       Jola Schmidt, MD 01/23/17 [phone]

## 2017-01-25 NOTE — Progress Notes (Signed)
Location:  [place] Room Number: 36 Place of Service:  ALF [phone]) Provider: Leyton Magoon FNP-C  [name], MD  Patient Care Team: [name], MD as PCP - General (Internal Medicine) [name], [facility] [name], MD as Consulting Physician (Orthopedic Surgery) [name], MD as Consulting Physician (Physical Medicine and Rehabilitation) Melina Schools, MD as Consulting Physician (Orthopedic Surgery) [name], MD as Consulting Physician (Otolaryngology) Miabella Shannahan, [name], NP as Nurse Practitioner (Family Medicine)  Extended Emergency Contact Information Primary Emergency Contact: [name] Address: [address]          [name] Phone: [phone] Relation: Son Secondary Emergency Contact: [name] [place] Phone: [phone] Mobile Phone: [phone] Relation: Daughter  Code Status:  DNR Goals of care: Advanced Directive information Advanced Directives 01/21/2017  Does Patient Have a Medical Advance Directive? Yes  Type of Advance Directive Living will;Healthcare Power of Attorney  Does patient want to make changes to medical advance directive? -  Copy of [place] in Chart? Yes  Pre-existing out of facility DNR order (yellow form or pink MOST form) -     Chief Complaint  Patient presents with  . Acute Visit    ER follow up    HPI:  Pt is a 83 y.o. female seen today at [facility] for an acute visit for follow up ED visit for right chest wall pain.She is seen in her room today.she was seen 01/19/2017 in the ED for right sided chest pain.Her chest X-ray was negative. Lab results without any abnormalities.She denies any acute pain this visit.of note imaging of her chest had evidence of an area in her lingular region suspicious for primary bronchogenic carcinoma and also an 11 mm hypermetabolic right upper lobe pulmonary nodule. She continues  to follow up with Oncology.She has an upcoming scheduled appointment for biopsy.Facility staff reports no new concerns. She continues to participate in facility activities.     Past Medical History:  Diagnosis Date  . Anemia, unspecified 03/18/2011  . Anxiety state, unspecified 01/2011  . Blood in stool 06/15/2012  . Corns and callosities 07/01/2011  . Diverticulosis 02/2011  . Dizziness   . DJD (degenerative joint disease)   . Dysuria 06/17/2011  . Hemorrhoids 02/2011  . Low back pain   . Mitral valve problem thickened   thickened  . Osteoarthritis of both hands 10/09/2015  . Osteoporosis 02/2011  . Other abnormal blood chemistry 0/30/2012  . Other acquired deformity of toe 12/16/2011  . Pain in joint, site unspecified 01/2012  . 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 Sacroiliitis, not elsewhere classified ([place]) 05/2011  . Seizures ([place])    Remotely  . Unspecified hypothyroidism 01/2011  . 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 Vitamin D deficiency 01/2011   Past Surgical History:  Procedure Laterality Date  . bletheroplasty    . CATARACT EXTRACTION W/ INTRAOCULAR LENS  IMPLANT, BILATERAL  2010  . CERVICAL LAMINECTOMY  2005  . CYSTOSCOPY WITH RETROGRADE PYELOGRAM, URETEROSCOPY AND STENT PLACEMENT Bilateral 11/16/2016   Procedure: CYSTOSCOPY WITH RETROGRADE PYELOGRAMS/CLOT EVACUATION;  Surgeon: Irine Seal, MD;  Location: [facility];  Service: Urology;  Laterality: Bilateral;  . HAMMER TOE SURGERY  2013   right 2nd toe Dr. Mallie Mussel  . IVC FILTER PLACEMENT ([facility] HX)    . JOINT REPLACEMENT Bilateral 2002 Right, 1992 Left   knees  . TRANSURETHRAL RESECTION  OF BLADDER TUMOR N/A 11/16/2016   Procedure: TRANSURETHRAL RESECTION OF BLADDER TUMOR (TURBT);  Surgeon: Irine Seal, MD;  Location: [facility];  Service: Urology;  Laterality: N/A;    Allergies  Allergen Reactions  . Macrodantin [Nitrofurantoin]   . Morphine And Related Other (See  Comments)    Altered mental state   . Sulfa Antibiotics Other (See Comments)    Reaction: unknown    Outpatient Encounter Prescriptions as of 01/21/2017  Medication Sig  . acetaminophen (TYLENOL) 325 MG tablet Take 650 mg by mouth every 6 (six) hours as needed for moderate pain.  . Calcium Carbonate-Vitamin D (CALCIUM 600+D) 600-400 MG-UNIT tablet Take 1 tablet by mouth daily.  . Cholecalciferol (VITAMIN D3) 5000 units TABS Take 1 tablet by mouth daily. Take one tablet daily   . clonazePAM (KLONOPIN) 0.5 MG tablet Take one tablet by mouth 2 times a day at 8 am and 8 pm.  . donepezil (ARICEPT) 10 MG tablet Take 10 mg by mouth daily.  . DULoxetine (CYMBALTA) 30 MG capsule Take 30 mg by mouth daily.   . fluticasone furoate-vilanterol (BREO ELLIPTA) 100-25 MCG/INH AEPB Inhale 1 puff into the lungs daily.  Marland Kitchen levothyroxine (SYNTHROID, LEVOTHROID) 100 MCG tablet Take 1 tablet (100 mcg total) by mouth daily before breakfast. For thyroid  . meloxicam (MOBIC) 7.5 MG tablet Take 7.5 mg by mouth daily as needed for pain.   . memantine (NAMENDA) 10 MG tablet Take 10 mg by mouth 2 (two) times daily.  . Multiple Vitamins-Minerals (THERA M PLUS PO) Take 1 tablet by mouth daily.   . Omega-3 1000 MG CAPS Take 1 capsule by mouth daily. Take one capsule every morning   . omeprazole (PRILOSEC) 20 MG capsule Take 20 mg by mouth at bedtime.  . phenytoin (DILANTIN) 100 MG ER capsule Take 100-200 mg by mouth See admin instructions. Take one capsule every morning; take 2 capsules at bedtime   . QUEtiapine (SEROQUEL) 25 MG tablet Take 12.5 mg by mouth daily. At 1 pm  . triamcinolone cream (KENALOG) 0.1 % Apply 1 application topically daily as needed (for rash).   . trolamine salicylate (ASPERCREME) 10 % cream Apply 1 application topically. Apply as needed   No facility-administered encounter medications on file as of 01/21/2017.     Review of Systems  Constitutional: Negative for activity change, appetite change,  chills, fatigue and fever.  HENT: Negative for congestion, rhinorrhea, sinus pressure, sneezing and sore throat.   Respiratory: Negative for cough, chest tightness, shortness of breath and wheezing.   Cardiovascular: Negative for chest pain, palpitations and leg swelling.  Gastrointestinal: Negative for abdominal distention, abdominal pain, constipation, diarrhea, nausea and vomiting.  Musculoskeletal: Positive for gait problem.  Skin: Negative for color change, pallor and rash.  Neurological: Negative for dizziness, seizures, syncope, light-headedness and headaches.  Psychiatric/Behavioral: Negative for agitation, confusion and sleep disturbance. The patient is not nervous/anxious.     Immunization History  Administered Date(s) Administered  . Influenza Whole 03/17/2007, 02/16/2009, 01/30/2010, 02/16/2013  . Influenza-Unspecified 03/02/2014, 02/22/2015, 02/28/2016  . PPD Test 07/09/2010, 02/04/2015, 04/04/2015, 06/04/2015  . Pneumococcal Conjugate-13 01/13/2017  . Pneumococcal Polysaccharide-23 05/19/2005, 05/23/2013  . Td 05/19/2005  . Tetanus 10/06/2012   Pertinent  Health Maintenance Due  Topic Date Due  . INFLUENZA VACCINE  12/17/2016  . DEXA SCAN  05/19/2017 (Originally 11/19/1994)  . PNA vac Low Risk Adult  Completed   Fall Risk  01/05/2017 10/09/2015 07/10/2015 03/26/2015 12/19/2014  Falls in the past year? No  No Yes Yes Yes  Number falls in past yr: - - 1 1 1   Injury with Fall? - - Yes No Yes  Comment - - scape on her left leg - hurt on left side and sacral bone    Vitals:   01/21/17 1045  BP: 107/67  Pulse: 66  Resp: 18  Temp: (!) 97.2 F (36.2 C)  SpO2: 92%  Weight: 139 lb 12.8 oz (63.4 kg)  Height: 5' (1.524 m)   Body mass index is 27.3 kg/m. Physical Exam  Constitutional:  Thin frail elderly pleasantly confused at her baseline   HENT:  Head: Normocephalic.  Mouth/Throat: Oropharynx is clear and moist. No oropharyngeal exudate.  Eyes: Pupils are equal, round,  and reactive to light. Conjunctivae and EOM are normal. Right eye exhibits no discharge. Left eye exhibits no discharge. No scleral icterus.  Neck: Normal range of motion. No JVD present. No thyromegaly present.  Cardiovascular: Normal rate, regular rhythm, normal heart sounds and intact distal pulses.  Exam reveals no gallop and no friction rub.   No murmur heard. Pulmonary/Chest: Effort normal and breath sounds normal. No respiratory distress. She has no wheezes. She has no rales. She exhibits no tenderness.  Abdominal: Soft. Bowel sounds are normal. She exhibits no distension. There is no tenderness. There is no rebound and no guarding.  Musculoskeletal: She exhibits no edema or tenderness.  Unsteady gait uses FWW.Arthritic changes to fingers.   Lymphadenopathy:    She has no cervical adenopathy.  Neurological: She is alert. Coordination normal.  Pleasantly confused at her baseline   Skin: Skin is warm and dry. No rash noted. No erythema. No pallor.  Psychiatric: She has a normal mood and affect.   Labs reviewed:  Recent Labs  11/13/16 1917 11/15/16 0347 11/17/16 0454 11/20/16  NA 136 142 140 140  K 4.0 3.5 3.6 4.3  CL 106 111 103  --   CO2 25 26 31   --   GLUCOSE 123* 93 95  --   BUN 18 9 10 17   CREATININE 0.75 0.60 0.53 0.6  CALCIUM 8.2* 8.4* 8.5*  --     Recent Labs  03/05/16 1730 07/17/16 11/13/16 1917 11/15/16 0347  AST 27 21 24 20   ALT 19 11 17 17   ALKPHOS 82 77 74 54  BILITOT 0.5  --  0.3 0.7  PROT 6.4*  --  5.9* 5.4*  ALBUMIN 3.5  --  3.0* 2.7*    Recent Labs  03/05/16 1730  11/13/16 1917 11/15/16 0347 11/16/16 1102 11/17/16 0454 11/20/16  WBC 5.9  < > 11.3* 9.3 8.0 6.6 7.0  NEUTROABS 3.9  --  9.5*  --   --   --   --   HGB 12.6  < > 12.7 11.3* 12.1 11.8* 12.4  HCT 39.4  < > 39.0 35.8* 37.6 36.3 36  MCV 99.5  --  97.7 98.6 97.7 96.0  --   PLT 173  < > 136* 127* 159 174 256  < > = values in this interval not displayed. Lab Results  Component Value  Date   TSH 0.533 11/13/2016   Lab Results  Component Value Date   HGBA1C 5.4 03/08/2015   Lab Results  Component Value Date   CHOL 235 (H) 03/06/2009   HDL 108.40 03/06/2009   LDLDIRECT 108.2 03/06/2009    Significant Diagnostic Results in last 30 days:  Dg Chest 2 View  Result Date: 01/19/2017 CLINICAL DATA:  83 year old with known lingular mass  suspicious for bronchogenic carcinoma, presenting with acute onset of right-sided chest pain that began today while the patient was exercising. Former long-time smoker. EXAM: CHEST  2 VIEW COMPARISON:  PET-CT 01/08/2017. High-resolution CT chest 12/04/2016. Chest x-rays 11/13/2016, 04/02/2015 and earlier. FINDINGS: AP semi-erect and lateral images were obtained. The mass involving the lingula identified on the July, 2018 CT is not well demonstrated on the chest x-ray, best seen on the lateral image projected over the lower hila. Severe chronic interstitial lung disease associated with low lung volumes is stable dating back to at least 2016. No new pulmonary parenchymal abnormality. No pleural effusions. Cardiac silhouette mildly enlarged, unchanged. Thoracic aorta atherosclerotic, unchanged. Hilar and mediastinal contours otherwise unremarkable. Degenerative changes throughout the thoracic and upper lumbar spine. Thoracolumbar levoscoliosis. IVC filter. IMPRESSION: 1.  No acute cardiopulmonary disease. 2. Stable chronic interstitial lung disease associated with low lung volumes, likely usual interstitial pneumonitis as described on the prior high-resolution CT chest. 3. The lingular mass identified on the prior CT is difficult to visualize on the chest x-ray but projects over the lower hila on the lateral image. 4. Stable mild cardiomegaly without evidence of pulmonary edema. Electronically Signed   By: Evangeline Dakin M.D.   On: 01/19/2017 12:29   Nm Pet Image Initial (pi) Skull Base To Thigh  Result Date: 01/08/2017 CLINICAL DATA:  Initial treatment  strategy for left upper lobe lung mass. EXAM: NUCLEAR MEDICINE PET SKULL BASE TO THIGH TECHNIQUE: 6.8 mCi F-18 FDG was injected intravenously. Full-ring PET imaging was performed from the skull base to thigh after the radiotracer. CT data was obtained and used for attenuation correction and anatomic localization. FASTING BLOOD GLUCOSE:  Value: 99 mg/dl COMPARISON:  CT on 12/04/2016 and 11/16/2016 FINDINGS: NECK:  No hypermetabolic lymph nodes or masses. CHEST: No hypermetabolic mediastinal or axillary lymph nodes identified. Mild hypermetabolic adenopathy seen in the left hilum, with SUV max measuring 6.7. Chronic interstitial lung disease is again seen in the peripheral lung zones bilaterally. Mild to moderate emphysema also noted. A 2.9 cm irregular mass is seen in the lingula, which has SUV max of 12.5. A new 11 mm pulmonary nodule seen in the posterior right upper lobe on image 21/8, which has SUV max of 3.7. Given short time course of appearance since previous study approximately 1 month ago, this favors an inflammatory or infectious etiology over neoplasm. ABDOMEN/PELVIS: No abnormal hypermetabolic activity within the liver, pancreas, adrenal glands, or spleen. No hypermetabolic lymph nodes in the abdomen or pelvis. Diverticulosis predominately involves the sigmoid colon, without evidence of diverticulitis. Aortic atherosclerosis. IVC filter in place. Tiny nonobstructive calculus seen in lower pole of left kidney. SKELETON: No focal hypermetabolic bone lesions to suggest skeletal metastasis. IMPRESSION: Persistent 2.9 cm hypermetabolic lingular mass, suspicious for primary bronchogenic carcinoma. Mild hypermetabolic left hilar lymphadenopathy, highly suspicious for metastatic disease. No other sites of metastatic disease identified. New 11 mm hypermetabolic right upper lobe pulmonary nodule. Rapid appearance since recent CT approximately 1 month ago favors inflammatory or infectious etiology over neoplasm.  Continued followup by chest CT recommended. Chronic interstitial lung disease and centrilobular emphysema. Colonic diverticulosis, without radiographic evidence of diverticulitis. Nonobstructing left nephrolithiasis. Aortic atherosclerosis. Electronically Signed   By: Earle Gell M.D.   On: 01/08/2017 13:55   Assessment/Plan   Right-sided chest pain Reports no right side chest pain this visit.Has a known area in her lingular region suspicious for primary bronchogenic carcinoma and also an 11 mm hypermetabolic right upper lobe pulmonary nodule.Follows up  with Oncology. Has an upcoming scheduled appointment for biopsy.Facility staff reports no new concerns.continue to monitor.    Family/ staff Communication: Reviewed plan of care with patient and facility Nurse supervisor  Labs/tests ordered: None   Sandrea Hughs, NP

## 2017-01-26 ENCOUNTER — Non-Acute Institutional Stay: Payer: Medicare Other | Admitting: Internal Medicine

## 2017-01-26 ENCOUNTER — Encounter: Payer: Self-pay | Admitting: Internal Medicine

## 2017-01-26 DIAGNOSIS — M545 Low back pain, unspecified: Secondary | ICD-10-CM

## 2017-01-26 DIAGNOSIS — R2681 Unsteadiness on feet: Secondary | ICD-10-CM | POA: Diagnosis not present

## 2017-01-26 DIAGNOSIS — E039 Hypothyroidism, unspecified: Secondary | ICD-10-CM

## 2017-01-26 DIAGNOSIS — W19XXXA Unspecified fall, initial encounter: Secondary | ICD-10-CM

## 2017-01-26 DIAGNOSIS — R079 Chest pain, unspecified: Secondary | ICD-10-CM | POA: Diagnosis not present

## 2017-01-26 DIAGNOSIS — J9611 Chronic respiratory failure with hypoxia: Secondary | ICD-10-CM

## 2017-01-26 DIAGNOSIS — F0391 Unspecified dementia with behavioral disturbance: Secondary | ICD-10-CM

## 2017-01-26 DIAGNOSIS — M25512 Pain in left shoulder: Secondary | ICD-10-CM | POA: Diagnosis not present

## 2017-01-26 DIAGNOSIS — K219 Gastro-esophageal reflux disease without esophagitis: Secondary | ICD-10-CM

## 2017-01-26 DIAGNOSIS — G40909 Epilepsy, unspecified, not intractable, without status epilepticus: Secondary | ICD-10-CM

## 2017-01-26 NOTE — Progress Notes (Signed)
Provider:  [name] MD  Location:  [facility] Room Number: [place]-36 Place of Service:  ALF (13)  PCP: [name], MD Patient Care Team: [name], MD as PCP - General (Internal Medicine) [name], [facility] [name], MD as Consulting Physician (Orthopedic Surgery) [name], MD as Consulting Physician (Physical Medicine and Rehabilitation) Melina Schools, MD as Consulting Physician (Orthopedic Surgery) [name], MD as Consulting Physician (Otolaryngology) [name], [name], NP as Nurse Practitioner (Family Medicine)  Extended Emergency Contact Information Primary Emergency Contact: [name] Address: [address], [place] Phone: [phone] Relation: Son Secondary Emergency Contact: [name] [place] Phone: [phone] Mobile Phone: [phone] Relation: Daughter  Code Status: DNR Goals of Care: Advanced Directive information Advanced Directives 01/26/2017  Does Patient Have a Medical Advance Directive? Yes  Type of Paramedic of [place];Out of facility DNR (pink MOST or yellow form)  Does patient want to make changes to medical advance directive? No - Patient declined  Copy of [place] in Chart? Yes  Pre-existing out of facility DNR order (yellow form or pink MOST form) Yellow form placed in chart (order not valid for inpatient use)      Chief Complaint  Patient presents with  . Medical Management of Chronic Issues    Routine Visit     HPI: Patient is a 83 y.o. female seen today for routine visit.   Fall- She had a fall this morning at 6:50 am while trying to get out of the bed. She mentions being on the floor on her left side. She denies hitting her head. She had difficulty standing due to pain and crawled to the door and called for help. She complaints of pain to left back area, left hip, left knee  and left shoulder. Denies headache. Of note, she also had a fall on 01/24/17.   Left lung mass- noted on CT chest. Pending biopsy. To be on o2 2 l by nasal canula with exertion. Reviewed pulmonology OV note from 11/28/16  OA- Currently on tylenol bid with prn order. also on prn meloxicam  And daily vitamin d and calcium supplement.   Dementia with behavioral disturbance- currently on seroquel and duloxetine for her behavior. Has behavioral issues ongoing eported by nursing. Also on donepezil and memantine. MMSE 01/05/17 20/30.  GERD- denies any heartburn or indigestion. Currently on omeprazole 20 mg daily  Seizure disorder- seizure free. Currently on phenytoin 100 mg am and 200 mg pm  Hypothyroidism- taking her levothyroxine   Past Medical History:  Diagnosis Date  . Anemia, unspecified 03/18/2011  . Anxiety state, unspecified 01/2011  . Blood in stool 06/15/2012  . Corns and callosities 07/01/2011  . Diverticulosis 02/2011  . Dizziness   . DJD (degenerative joint disease)   . Dysuria 06/17/2011  . Hemorrhoids 02/2011  . Low back pain   . Mitral valve problem thickened   thickened  . Osteoarthritis of both hands 10/09/2015  . Osteoporosis 02/2011  . Other abnormal blood chemistry 0/30/2012  . Other acquired deformity of toe 12/16/2011  . Pain in joint, site unspecified 01/2012  . 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 Sacroiliitis, not elsewhere classified ([place]) 05/2011  . Seizures ([place])    Remotely  . Unspecified hypothyroidism 01/2011  . 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 Vitamin D  deficiency 01/2011   Past Surgical History:  Procedure Laterality Date  . bletheroplasty    . CATARACT EXTRACTION W/ INTRAOCULAR LENS  IMPLANT, BILATERAL  2010  . CERVICAL LAMINECTOMY  2005  . CYSTOSCOPY WITH RETROGRADE PYELOGRAM, URETEROSCOPY AND STENT PLACEMENT Bilateral 11/16/2016   Procedure: CYSTOSCOPY WITH RETROGRADE PYELOGRAMS/CLOT  EVACUATION;  Surgeon: Irine Seal, MD;  Location: [facility];  Service: Urology;  Laterality: Bilateral;  . HAMMER TOE SURGERY  2013   right 2nd toe Dr. Mallie Mussel  . IVC FILTER PLACEMENT ([facility] HX)    . JOINT REPLACEMENT Bilateral 2002 Right, 1992 Left   knees  . TRANSURETHRAL RESECTION OF BLADDER TUMOR N/A 11/16/2016   Procedure: TRANSURETHRAL RESECTION OF BLADDER TUMOR (TURBT);  Surgeon: Irine Seal, MD;  Location: [facility];  Service: Urology;  Laterality: N/A;    reports that she quit smoking about 28 years ago. She has a 25.00 pack-year smoking history. She has never used smokeless tobacco. She reports that she drinks about 0.6 oz of alcohol per week . She reports that she does not use drugs. Social History   Social History  . Marital status: Widowed    Spouse name: N/A  . Number of children: N/A  . Years of education: N/A   Occupational History  . Not on file.   Social History Main Topics  . Smoking status: Former Smoker    Packs/day: 0.50    Years: 50.00    Quit date: 09/14/1988  . Smokeless tobacco: Never Used     Comment: Smoked <1ppd  . Alcohol use 0.6 oz/week    1 Glasses of wine per week     Comment: doesn't drink routinely & only on social occasions  . Drug use: No  . Sexual activity: No   Other Topics Concern  . Not on file   Social History Narrative   Lives at [facility] since 10/07/2010, moved to [place] 01/16/2015   Widowed   Exercise class 3 times a week     Never smoked   Alcholol wine social    POA, Living Will, DNR      [facility]:   Primary MPOA:  Christell Faith (Daughter) - [phone]   Secondary MPOA:  Cherolyn Behrle [facility]) - [phone]      [facility] (11/28/16):   Originally from [facility]. Lived in [place] for 2 years from 1960-1961. No pets currently. No bird or mold exposure. 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     Functional Status Survey:    Family History  Problem Relation Age of Onset  . Alzheimer's disease  Sister   . Emphysema Sister   . Emphysema Father   . Rheumatologic disease Neg Hx     Health Maintenance  Topic Date Due  . INFLUENZA VACCINE  02/16/2017 (Originally 12/17/2016)  . DEXA SCAN  05/19/2017 (Originally 11/19/1994)  . TETANUS/TDAP  10/07/2022  . PNA vac Low Risk Adult  Completed    Allergies  Allergen Reactions  . Macrodantin [Nitrofurantoin]   . Morphine And Related Other (See Comments)    Altered mental state   . Sulfa Antibiotics Other (See Comments)    Reaction: unknown    Outpatient Encounter Prescriptions as of 01/26/2017  Medication Sig  . acetaminophen (TYLENOL) 325 MG tablet Take 650 mg by mouth every 4 (four) hours as needed for moderate pain (In addition to the scheduled dose).   Marland Kitchen acetaminophen (TYLENOL) 325 MG tablet Take 650 mg by mouth 2 (two) times daily.  Marland Kitchen  amoxicillin (AMOXIL) 500 MG tablet Take by mouth as needed. Give 4 tablets one hour prior to procedure.  . Calcium Carbonate-Vitamin D (CALCIUM 600+D) 600-400 MG-UNIT tablet Take 1 tablet by mouth daily.  . Cholecalciferol (VITAMIN D3) 5000 units TABS Take 1 tablet by mouth daily. Take one tablet daily   . clonazePAM (KLONOPIN) 0.5 MG tablet Take one tablet by mouth 2 times a day at 8 am and 8 pm.  . donepezil (ARICEPT) 10 MG tablet Take 10 mg by mouth daily.  . DULoxetine (CYMBALTA) 30 MG capsule Take 30 mg by mouth daily.   . fluticasone furoate-vilanterol (BREO ELLIPTA) 100-25 MCG/INH AEPB Inhale 1 puff into the lungs daily.  Marland Kitchen levothyroxine (SYNTHROID, LEVOTHROID) 100 MCG tablet Take 1 tablet (100 mcg total) by mouth daily before breakfast. For thyroid  . meloxicam (MOBIC) 7.5 MG tablet Take 7.5 mg by mouth daily as needed for pain.   . memantine (NAMENDA) 10 MG tablet Take 10 mg by mouth 2 (two) times daily.  . Multiple Vitamins-Minerals (THERA M PLUS PO) Take 1 tablet by mouth daily.   . Omega-3 1000 MG CAPS Take 1 capsule by mouth daily. Take one capsule every morning   . omeprazole (PRILOSEC)  20 MG capsule Take 20 mg by mouth at bedtime.  . phenytoin (DILANTIN) 100 MG ER capsule Take 100-200 mg by mouth See admin instructions. Take one capsule every morning; take 2 capsules at bedtime   . QUEtiapine (SEROQUEL) 25 MG tablet Take 12.5 mg by mouth daily. At 1 pm  . triamcinolone cream (KENALOG) 0.1 % Apply 1 application topically daily as needed (for rash).   . trolamine salicylate (ASPERCREME) 10 % cream Apply 1 application topically. Apply as needed   No facility-administered encounter medications on file as of 01/26/2017.     Review of Systems  Constitutional: Positive for fatigue. Negative for appetite change, chills, diaphoresis and fever.  HENT: Negative for congestion, mouth sores, rhinorrhea and trouble swallowing.   Respiratory: Negative for cough, shortness of breath and wheezing.   Cardiovascular: Negative for chest pain, palpitations and leg swelling.  Gastrointestinal: Negative for abdominal pain, diarrhea, nausea and vomiting.       Last bowel movement was yesterday evening  Genitourinary: Positive for urgency. Negative for dysuria and hematuria.       Occasional incontience  Musculoskeletal: Positive for back pain and gait problem. Negative for joint swelling and neck pain.       Left hip and shoulder pain, right wrist pain, has OA of both knee s/p arthroplasty. Left lower back pain more on left side post fall this am. Uses walker to get around. Unsteady gait.   Skin: Negative for pallor, rash and wound.  Neurological: Positive for weakness. Negative for dizziness, numbness and headaches.       Generalized weakness, has history of seizures  Psychiatric/Behavioral: Positive for confusion. Negative for behavioral problems and sleep disturbance.       Poor safety awareness    Vitals:   01/26/17 1024  BP: 122/74  Pulse: 70  Resp: 20  Temp: (!) 96.4 F (35.8 C)  TempSrc: Oral  SpO2: 92%  Weight: 139 lb 12.8 oz (63.4 kg)  Height: 5' (1.524 m)   Body mass index  is 27.3 kg/m. Physical Exam  Constitutional: She appears well-developed and well-nourished. No distress.  HENT:  Head: Normocephalic and atraumatic.  Mouth/Throat: Oropharynx is clear and moist.  Eyes: Pupils are equal, round, and reactive to light. Conjunctivae and EOM are  normal.  Neck: Normal range of motion. Neck supple. No thyromegaly present.  Cardiovascular: Normal rate and regular rhythm.   Murmur heard. Pulmonary/Chest: Effort normal. No respiratory distress. She has no wheezes. She has rales.  Rales to lung bases  Abdominal: Soft. Bowel sounds are normal. She exhibits no distension. There is no tenderness. There is no rebound and no guarding.  Musculoskeletal: She exhibits tenderness and deformity. She exhibits no edema.  Can move all 4 extremities, has arthritis changes, uses a walker, limited ROM with left shoulder. Good ROM with left hip and knee. Tenderness to palpation on left lumbar paraspinal area and left posterior rib area.   Lymphadenopathy:    She has no cervical adenopathy.  Neurological: She is alert.  Oriented to place and person but not to time  Skin: Skin is warm and dry. No rash noted. She is not diaphoretic.  bruise to left lower back area  Psychiatric: She has a normal mood and affect.  Pleasantly confused    Labs reviewed: Basic Metabolic Panel:  Recent Labs  11/13/16 1917 11/15/16 0347 11/17/16 0454 11/20/16  NA 136 142 140 140  K 4.0 3.5 3.6 4.3  CL 106 111 103  --   CO2 25 26 31   --   GLUCOSE 123* 93 95  --   BUN 18 9 10 17   CREATININE 0.75 0.60 0.53 0.6  CALCIUM 8.2* 8.4* 8.5*  --    Liver Function Tests:  Recent Labs  03/05/16 1730 07/17/16 11/13/16 1917 11/15/16 0347  AST 27 21 24 20   ALT 19 11 17 17   ALKPHOS 82 77 74 54  BILITOT 0.5  --  0.3 0.7  PROT 6.4*  --  5.9* 5.4*  ALBUMIN 3.5  --  3.0* 2.7*   No results for input(s): LIPASE, AMYLASE in the last 8760 hours. No results for input(s): AMMONIA in the last 8760  hours. CBC:  Recent Labs  03/05/16 1730  11/13/16 1917 11/15/16 0347 11/16/16 1102 11/17/16 0454 11/20/16  WBC 5.9  < > 11.3* 9.3 8.0 6.6 7.0  NEUTROABS 3.9  --  9.5*  --   --   --   --   HGB 12.6  < > 12.7 11.3* 12.1 11.8* 12.4  HCT 39.4  < > 39.0 35.8* 37.6 36.3 36  MCV 99.5  --  97.7 98.6 97.7 96.0  --   PLT 173  < > 136* 127* 159 174 256  < > = values in this interval not displayed. Cardiac Enzymes: No results for input(s): CKTOTAL, CKMB, CKMBINDEX, TROPONINI in the last 8760 hours. BNP: Invalid input(s): POCBNP Lab Results  Component Value Date   HGBA1C 5.4 03/08/2015   Lab Results  Component Value Date   TSH 0.533 11/13/2016   Lab Results  Component Value Date   TDHRCBUL84 536 01/27/2011   Lab Results  Component Value Date   FOLATE 16.4 01/27/2011   Lab Results  Component Value Date   IRON 41 (L) 01/27/2011   TIBC 159 (L) 01/27/2011   FERRITIN 406 (H) 01/27/2011    Imaging and Procedures obtained prior to SNF admission: Dg Chest 2 View  Result Date: 11/13/2016 CLINICAL DATA:  Fever and decreased mental status EXAM: CHEST  2 VIEW COMPARISON:  04/02/2015.  03/28/2015. FINDINGS: AP and lateral views of the chest show enlargement of the cardiopericardial silhouette. There is diffuse underlying chronic interstitial lung disease, as before. No definite superimposed pulmonary edema or focal pneumonia. No substantial pleural effusion. The visualized  bony structures of the thorax are intact. Telemetry leads overlie the chest. IMPRESSION: Chronic underlying interstitial lung disease, similar to prior studies. No definite superimposed focal pneumonia or pulmonary edema at this time. Electronically Signed   By: Misty Stanley M.D.   On: 11/13/2016 20:09   Ct Head Wo Contrast  Result Date: 11/13/2016 CLINICAL DATA:  83 year old female with altered mental status and fever. EXAM: CT HEAD WITHOUT CONTRAST TECHNIQUE: Contiguous axial images were obtained from the base of the  skull through the vertex without intravenous contrast. COMPARISON:  03/30/2016 and prior exams FINDINGS: Brain: No evidence of acute infarction, hemorrhage, hydrocephalus, extra-axial collection or mass lesion/mass effect. Atrophy, chronic small-vessel white matter ischemic changes and remote left occipital infarct again noted. Vascular: Intracranial atherosclerotic calcifications noted. Skull: Normal. Negative for fracture or focal lesion. Sinuses/Orbits: No acute finding. Other: None. IMPRESSION: No at evidence of acute intracranial abnormality. Atrophy, chronic small-vessel white matter ischemic changes and remote left occipital infarct. Electronically Signed   By: Margarette Canada M.D.   On: 11/13/2016 22:47   Dg Fluoro Guide Lumbar Puncture  Result Date: 11/14/2016 CLINICAL DATA:  Fever of unknown origin.  Altered mental status. EXAM: DIAGNOSTIC LUMBAR PUNCTURE UNDER FLUOROSCOPIC GUIDANCE FLUOROSCOPY TIME:  Fluoroscopy Time:  1 minutes PROCEDURE: Informed consent was obtained from the patient's son by phone prior to the procedure, including potential complications of headache, allergy, and pain. With the patient prone, the lower back was prepped with Betadine. 1% Lidocaine was used for local anesthesia. Lumbar puncture was performed at the L3-4 level using a 21 gauge needle with return of clear colorless CSF. 20 ml of CSF were obtained for laboratory studies. The patient tolerated the procedure well and there were no apparent complications. IMPRESSION: Lumbar puncture performed without complication. Laboratory results to follow. Electronically Signed   By: Lorriane Shire M.D.   On: 11/14/2016 10:08    Assessment/Plan  Fall Initial encounter. [place] this am. Has unsteady gait. See below  Unsteady gait To use her walker. Will have patient work with PT/OT as tolerated to regain strength and restore function.  Fall precautions are in place. Her bed will be re-positioned with one side to the wall and will  provide bed rail to 1 side.   Left back pain With tenderness to lumbar region on palpation. Obtain xray of left posterior ribs and lumbar spine to rule out fracture. Tylenol has been helpful. Change her tylenol to 650 mg tid x 2 weeks and then back to bid and monitor  Left shoulder pain With limited ROM, obtain xray to rule out fracture. Tylenol as above  Hypoxic respiratory failure HRCT chest showing left lung mass and ILD changes. Pending biopsy for further study. to use o2 by nasal canula with exertion. F/u with pulmonology  Dementia with behavioral disturbance continue seroquel and duloxetine for her behavior. Continue donepezil and memantine. Decrease clonazepam to 0.5 mg in am and change bedtime dosing to prn only and monitor given her frequent falls  GERD With hiatal hernia noted on recent HRCT chest. Continue omeprazole 20 mg daily  Seizure disorder seizure free. Currently on phenytoin 100 mg am and 200 mg pm  Hypothyroidism Lab Results  Component Value Date   TSH 0.533 11/13/2016   Continue levothyroxine   Family/ staff Communication: reviewed care plan with patient and charge nurse.    Labs/tests ordered: xray as above  [name], MD Internal Medicine [facility] [address] [place], [place] [address]  Cell Phone (Monday-Friday 8 am - 5 pm): [phone] On Call: [phone] and follow prompts after 5 pm and on weekends Office Phone: [phone] Office Fax: [phone]

## 2017-01-26 NOTE — Addendum Note (Signed)
Addended byBlanchie Serve on: 01/26/2017 01:22 PM   Modules accepted: Level of Service

## 2017-01-27 DIAGNOSIS — R2681 Unsteadiness on feet: Secondary | ICD-10-CM | POA: Diagnosis not present

## 2017-01-27 DIAGNOSIS — R41841 Cognitive communication deficit: Secondary | ICD-10-CM | POA: Diagnosis not present

## 2017-01-27 DIAGNOSIS — M25552 Pain in left hip: Secondary | ICD-10-CM | POA: Diagnosis not present

## 2017-01-27 DIAGNOSIS — M6281 Muscle weakness (generalized): Secondary | ICD-10-CM | POA: Diagnosis not present

## 2017-01-27 DIAGNOSIS — M545 Low back pain: Secondary | ICD-10-CM | POA: Diagnosis not present

## 2017-01-28 ENCOUNTER — Other Ambulatory Visit: Payer: Self-pay | Admitting: Radiology

## 2017-01-28 ENCOUNTER — Other Ambulatory Visit: Payer: Self-pay | Admitting: General Surgery

## 2017-01-28 DIAGNOSIS — M25552 Pain in left hip: Secondary | ICD-10-CM | POA: Diagnosis not present

## 2017-01-28 DIAGNOSIS — R2681 Unsteadiness on feet: Secondary | ICD-10-CM | POA: Diagnosis not present

## 2017-01-28 DIAGNOSIS — M6281 Muscle weakness (generalized): Secondary | ICD-10-CM | POA: Diagnosis not present

## 2017-01-28 DIAGNOSIS — R41841 Cognitive communication deficit: Secondary | ICD-10-CM | POA: Diagnosis not present

## 2017-01-28 DIAGNOSIS — M545 Low back pain: Secondary | ICD-10-CM | POA: Diagnosis not present

## 2017-01-29 ENCOUNTER — Ambulatory Visit (HOSPITAL_COMMUNITY)
Admission: RE | Admit: 2017-01-29 | Discharge: 2017-01-29 | Disposition: A | Discharge status: Home / Self Care | Payer: Medicare Other | Source: Ambulatory Visit | Attending: Pulmonary Disease | Admitting: Pulmonary Disease

## 2017-01-29 ENCOUNTER — Ambulatory Visit (HOSPITAL_COMMUNITY)
Admission: RE | Admit: 2017-01-29 | Discharge: 2017-01-29 | Disposition: A | Discharge status: Home / Self Care | Payer: Medicare Other | Source: Ambulatory Visit | Attending: Interventional Radiology | Admitting: Interventional Radiology

## 2017-01-29 ENCOUNTER — Encounter (HOSPITAL_COMMUNITY): Payer: Self-pay

## 2017-01-29 ENCOUNTER — Telehealth: Payer: Self-pay | Admitting: Pulmonary Disease

## 2017-01-29 DIAGNOSIS — Z888 Allergy status to other drugs, medicaments and biological substances status: Secondary | ICD-10-CM | POA: Insufficient documentation

## 2017-01-29 DIAGNOSIS — Z79899 Other long term (current) drug therapy: Secondary | ICD-10-CM | POA: Diagnosis not present

## 2017-01-29 DIAGNOSIS — Z9842 Cataract extraction status, left eye: Secondary | ICD-10-CM | POA: Diagnosis not present

## 2017-01-29 DIAGNOSIS — K5730 Diverticulosis of large intestine without perforation or abscess without bleeding: Secondary | ICD-10-CM | POA: Insufficient documentation

## 2017-01-29 DIAGNOSIS — Z825 Family history of asthma and other chronic lower respiratory diseases: Secondary | ICD-10-CM | POA: Diagnosis not present

## 2017-01-29 DIAGNOSIS — Z792 Long term (current) use of antibiotics: Secondary | ICD-10-CM | POA: Diagnosis not present

## 2017-01-29 DIAGNOSIS — Z820 Family history of epilepsy and other diseases of the nervous system: Secondary | ICD-10-CM | POA: Diagnosis not present

## 2017-01-29 DIAGNOSIS — M19042 Primary osteoarthritis, left hand: Secondary | ICD-10-CM | POA: Insufficient documentation

## 2017-01-29 DIAGNOSIS — Z87891 Personal history of nicotine dependence: Secondary | ICD-10-CM | POA: Diagnosis not present

## 2017-01-29 DIAGNOSIS — M19041 Primary osteoarthritis, right hand: Secondary | ICD-10-CM | POA: Insufficient documentation

## 2017-01-29 DIAGNOSIS — Z881 Allergy status to other antibiotic agents status: Secondary | ICD-10-CM | POA: Diagnosis not present

## 2017-01-29 DIAGNOSIS — C3490 Malignant neoplasm of unspecified part of unspecified bronchus or lung: Secondary | ICD-10-CM | POA: Insufficient documentation

## 2017-01-29 DIAGNOSIS — C3412 Malignant neoplasm of upper lobe, left bronchus or lung: Secondary | ICD-10-CM | POA: Diagnosis not present

## 2017-01-29 DIAGNOSIS — Z961 Presence of intraocular lens: Secondary | ICD-10-CM | POA: Diagnosis not present

## 2017-01-29 DIAGNOSIS — Z885 Allergy status to narcotic agent status: Secondary | ICD-10-CM | POA: Insufficient documentation

## 2017-01-29 DIAGNOSIS — C3410 Malignant neoplasm of upper lobe, unspecified bronchus or lung: Secondary | ICD-10-CM | POA: Diagnosis not present

## 2017-01-29 DIAGNOSIS — Z9841 Cataract extraction status, right eye: Secondary | ICD-10-CM | POA: Diagnosis not present

## 2017-01-29 DIAGNOSIS — Z9889 Other specified postprocedural states: Secondary | ICD-10-CM | POA: Insufficient documentation

## 2017-01-29 DIAGNOSIS — R911 Solitary pulmonary nodule: Secondary | ICD-10-CM | POA: Diagnosis not present

## 2017-01-29 DIAGNOSIS — E039 Hypothyroidism, unspecified: Secondary | ICD-10-CM | POA: Diagnosis not present

## 2017-01-29 DIAGNOSIS — E559 Vitamin D deficiency, unspecified: Secondary | ICD-10-CM | POA: Diagnosis not present

## 2017-01-29 DIAGNOSIS — R918 Other nonspecific abnormal finding of lung field: Secondary | ICD-10-CM | POA: Insufficient documentation

## 2017-01-29 DIAGNOSIS — Z882 Allergy status to sulfonamides status: Secondary | ICD-10-CM | POA: Insufficient documentation

## 2017-01-29 LAB — PROTIME-INR
INR: 0.97
Prothrombin Time: 12.8 seconds (ref 11.4–15.2)

## 2017-01-29 LAB — CBC
HCT: 42.2 % (ref 36.0–46.0)
Hemoglobin: 13.7 g/dL (ref 12.0–15.0)
MCH: 32.2 pg (ref 26.0–34.0)
MCHC: 32.5 g/dL (ref 30.0–36.0)
MCV: 99.1 fL (ref 78.0–100.0)
PLATELETS: 185 10*3/uL (ref 150–400)
RBC: 4.26 MIL/uL (ref 3.87–5.11)
RDW: 13.3 % (ref 11.5–15.5)
WBC: 7.3 10*3/uL (ref 4.0–10.5)

## 2017-01-29 MED ORDER — ACETAMINOPHEN 325 MG PO TABS
650.0000 mg | ORAL_TABLET | Freq: Once | ORAL | Status: AC
  Administered 2017-01-29: 650 mg via ORAL
  Filled 2017-01-29: qty 2

## 2017-01-29 MED ORDER — FENTANYL CITRATE (PF) 100 MCG/2ML IJ SOLN
INTRAMUSCULAR | Status: AC | PRN
  Administered 2017-01-29 (×2): 25 ug via INTRAVENOUS

## 2017-01-29 MED ORDER — FENTANYL CITRATE (PF) 100 MCG/2ML IJ SOLN
INTRAMUSCULAR | Status: AC
  Filled 2017-01-29: qty 4

## 2017-01-29 MED ORDER — LIDOCAINE HCL (PF) 1 % IJ SOLN
INTRAMUSCULAR | Status: AC
  Filled 2017-01-29: qty 30

## 2017-01-29 MED ORDER — SODIUM CHLORIDE 0.9 % IV SOLN: INTRAVENOUS | Status: DC

## 2017-01-29 MED ORDER — MIDAZOLAM HCL 2 MG/2ML IJ SOLN
INTRAMUSCULAR | Status: AC | PRN
  Administered 2017-01-29: 0.5 mg via INTRAVENOUS

## 2017-01-29 MED ORDER — ACETAMINOPHEN 325 MG PO TABS
ORAL_TABLET | ORAL | Status: AC
  Filled 2017-01-29: qty 2

## 2017-01-29 MED ORDER — MIDAZOLAM HCL 2 MG/2ML IJ SOLN
INTRAMUSCULAR | Status: AC
  Filled 2017-01-29: qty 4

## 2017-01-29 NOTE — Progress Notes (Addendum)
Pt/ family c/o pain at biopsy site. Loleta Books PA was called and tylenol was ordered. Pt has a 2"x2" round hematoma that formed during the procedure as it was reported, it was called a lidocaine bump and a small hematoma. Family was updated during DC it should resolve in a few weeks but to call MD office with further need.

## 2017-01-29 NOTE — Procedures (Signed)
Interventional Radiology Procedure Note  Procedure: CT guided biopsy of lingular pulm nodule Complications: No immediate Recommendations: - Bedrest until CXR cleared.  Minimize talking, coughing or otherwise straining.  - Follow up 1 hr CXR pending   Signed,  Criselda Peaches, MD

## 2017-01-29 NOTE — Sedation Documentation (Signed)
Patient is resting comfortably. 

## 2017-01-29 NOTE — H&P (Signed)
Chief Complaint: Left upper lobe mass  Referring Physician(s): Sentinel E  Supervising Physician: Jacqulynn Cadet  Patient Status: [facility] - Out-pt  History of Present Illness: Rebecca [name] is a 83 y.o. female with known interstitial lung disease who is followed by Dr. Ashok Cordia.  She underwent a bladder tumor resection recently and required prolonged O2 for hypoxia. This did eventually resolve.  On F/U chest x-ray imaging there appeared to be some progression of the interstitial markings which could  reflect her underlying interstitial lung disease.  CT scan was obtained on 12/04/2016 which revealed a cavitary 3.2 cm spiculated lung mass in the peripheral lingula, suspicious for primary bronchogenic carcinoma.  PET done 01/08/2017 showed a persistent 2.9 cm hypermetabolic lingular mass, suspicious for primary bronchogenic carcinoma.  She is here today for biopsy.  Per her family, she ambulates well with a walker and is still pretty active. She enjoys playing cards.  She is NPO. Denies blood thinners.  Past Medical History:  Diagnosis Date  . Anemia, unspecified 03/18/2011  . Anxiety state, unspecified 01/2011  . Blood in stool 06/15/2012  . Corns and callosities 07/01/2011  . Diverticulosis 02/2011  . Dizziness   . DJD (degenerative joint disease)   . Dysuria 06/17/2011  . Hemorrhoids 02/2011  . Low back pain   . Mitral valve problem thickened   thickened  . Osteoarthritis of both hands 10/09/2015  . Osteoporosis 02/2011  . Other abnormal blood chemistry 0/30/2012  . Other acquired deformity of toe 12/16/2011  . Pain in joint, site unspecified 01/2012  . 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 Sacroiliitis, not elsewhere classified ([place]) 05/2011  . Seizures ([place])    Remotely  . Unspecified hypothyroidism 01/2011  . 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 Vitamin D deficiency 01/2011    Past Surgical History:    Procedure Laterality Date  . bletheroplasty    . CATARACT EXTRACTION W/ INTRAOCULAR LENS  IMPLANT, BILATERAL  2010  . CERVICAL LAMINECTOMY  2005  . CYSTOSCOPY WITH RETROGRADE PYELOGRAM, URETEROSCOPY AND STENT PLACEMENT Bilateral 11/16/2016   Procedure: CYSTOSCOPY WITH RETROGRADE PYELOGRAMS/CLOT EVACUATION;  Surgeon: Irine Seal, MD;  Location: [facility] ORS;  Service: Urology;  Laterality: Bilateral;  . HAMMER TOE SURGERY  2013   right 2nd toe Dr. Mallie Mussel  . IVC FILTER PLACEMENT ([facility] HX)    . JOINT REPLACEMENT Bilateral 2002 Right, 1992 Left   knees  . TRANSURETHRAL RESECTION OF BLADDER TUMOR N/A 11/16/2016   Procedure: TRANSURETHRAL RESECTION OF BLADDER TUMOR (TURBT);  Surgeon: Irine Seal, MD;  Location: [facility];  Service: Urology;  Laterality: N/A;    Allergies: Macrodantin [nitrofurantoin]; Morphine and related; and Sulfa antibiotics  Medications: Prior to Admission medications   Medication Sig Start Date End Date Taking? Authorizing Provider  acetaminophen (TYLENOL) 325 MG tablet Take 650 mg by mouth every 4 (four) hours as needed for moderate pain (In addition to the scheduled dose).     [provider]  acetaminophen (TYLENOL) 325 MG tablet Take 650 mg by mouth 2 (two) times daily.    [provider]  amoxicillin (AMOXIL) 500 MG tablet Take by mouth as needed. Give 4 tablets one hour prior to procedure.    [provider]  Calcium Carbonate-Vitamin D (CALCIUM 600+D) 600-400 MG-UNIT tablet Take 1 tablet by mouth daily.    [provider]  Cholecalciferol (VITAMIN D3) 5000 units TABS Take 1 tablet by mouth daily. Take one tablet daily  [provider]  clonazePAM (KLONOPIN) 0.5 MG tablet Take one tablet by mouth 2 times a day at 8 am and 8 pm. 11/17/16   Florencia Reasons, MD  donepezil (ARICEPT) 10 MG tablet Take 10 mg by mouth daily.    [provider]  DULoxetine (CYMBALTA) 30 MG capsule Take 30 mg by mouth daily.     [provider]   fluticasone furoate-vilanterol (BREO ELLIPTA) 100-25 MCG/INH AEPB Inhale 1 puff into the lungs daily.    [provider]  levothyroxine (SYNTHROID, LEVOTHROID) 100 MCG tablet Take 1 tablet (100 mcg total) by mouth daily before breakfast. For thyroid 09/28/14   Estill Dooms, MD  meloxicam (MOBIC) 7.5 MG tablet Take 7.5 mg by mouth daily as needed for pain.     [provider]  memantine (NAMENDA) 10 MG tablet Take 10 mg by mouth 2 (two) times daily.    [provider]  Multiple Vitamins-Minerals (THERA M PLUS PO) Take 1 tablet by mouth daily.     [provider]  Omega-3 1000 MG CAPS Take 1 capsule by mouth daily. Take one capsule every morning     [provider]  omeprazole (PRILOSEC) 20 MG capsule Take 20 mg by mouth at bedtime.    [provider]  phenytoin (DILANTIN) 100 MG ER capsule Take 100-200 mg by mouth See admin instructions. Take one capsule every morning; take 2 capsules at bedtime     [provider]  QUEtiapine (SEROQUEL) 25 MG tablet Take 12.5 mg by mouth daily. At 1 pm    [provider]  triamcinolone cream (KENALOG) 0.1 % Apply 1 application topically daily as needed (for rash).  03/06/16   [provider]  trolamine salicylate (ASPERCREME) 10 % cream Apply 1 application topically. Apply as needed    [provider]     Family History  Problem Relation Age of Onset  . Alzheimer's disease Sister   . Emphysema Sister   . Emphysema Father   . Rheumatologic disease Neg Hx     Social History   Social History  . Marital status: Widowed    Spouse name: N/A  . Number of children: N/A  . Years of education: N/A   Social History Main Topics  . Smoking status: Former Smoker    Packs/day: 0.50    Years: 50.00    Quit date: 09/14/1988  . Smokeless tobacco: Never Used     Comment: Smoked <1ppd  . Alcohol use 0.6 oz/week    1 Glasses of wine per week     Comment: doesn't drink  routinely & only on social occasions  . Drug use: No  . Sexual activity: No   Other Topics Concern  . None   Social History Narrative   Lives at [facility] since 10/07/2010, moved to [place] 01/16/2015   Widowed   Exercise class 3 times a week     Never smoked   Alcholol wine social    POA, Living Will, DNR      [facility]:   Primary MPOA:  Christell Faith (Daughter) - [phone]   Secondary MPOA:  Kathlen Sakurai [facility]) - [phone]      [facility] (11/28/16):   Originally from [facility]. Lived in [place] for 2 years from 1960-1961. No pets currently. No bird or mold exposure. 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     Review of Systems: A 12 point ROS discussed Review of Systems  Constitutional:  Recent fall with a small resolving bruise left flank.  HENT: Negative.   Respiratory: Negative.   Cardiovascular: Negative.   Gastrointestinal: Negative.   Genitourinary: Negative.   Musculoskeletal: Negative.   Skin: Negative.   Neurological: Negative.   Hematological: Negative.   Psychiatric/Behavioral: Negative.     Vital Signs: BP 129/82   Pulse 80   Temp 97.8 F (36.6 C) (Oral)   Ht 5\' 1"  (1.549 m)   Wt 144 lb (65.3 kg)   SpO2 93%   BMI 27.21 kg/m   Physical Exam  Constitutional: She is oriented to person, place, and time. She appears well-developed.  HENT:  Head: Normocephalic and atraumatic.  Eyes: EOM are normal.  Neck: Normal range of motion.  Cardiovascular: Normal rate, regular rhythm and normal heart sounds.   Pulmonary/Chest: Effort normal and breath sounds normal. No respiratory distress. She has no wheezes.  Abdominal: Soft. She exhibits no distension. There is no tenderness.  Musculoskeletal: Normal range of motion.  Neurological: She is alert and oriented to person, place, and time.  Skin: Skin is warm and dry.  Psychiatric: She has a normal mood and affect. Her behavior is normal.  Mild Dementia.  Vitals  reviewed.   Imaging: Dg Chest 2 View  Result Date: 01/19/2017 CLINICAL DATA:  83 year old with known lingular mass suspicious for bronchogenic carcinoma, presenting with acute onset of right-sided chest pain that began today while the patient was exercising. Former long-time smoker. EXAM: CHEST  2 VIEW COMPARISON:  PET-CT 01/08/2017. High-resolution CT chest 12/04/2016. Chest x-rays 11/13/2016, 04/02/2015 and earlier. FINDINGS: AP semi-erect and lateral images were obtained. The mass involving the lingula identified on the July, 2018 CT is not well demonstrated on the chest x-ray, best seen on the lateral image projected over the lower hila. Severe chronic interstitial lung disease associated with low lung volumes is stable dating back to at least 2016. No new pulmonary parenchymal abnormality. No pleural effusions. Cardiac silhouette mildly enlarged, unchanged. Thoracic aorta atherosclerotic, unchanged. Hilar and mediastinal contours otherwise unremarkable. Degenerative changes throughout the thoracic and upper lumbar spine. Thoracolumbar levoscoliosis. IVC filter. IMPRESSION: 1.  No acute cardiopulmonary disease. 2. Stable chronic interstitial lung disease associated with low lung volumes, likely usual interstitial pneumonitis as described on the prior high-resolution CT chest. 3. The lingular mass identified on the prior CT is difficult to visualize on the chest x-ray but projects over the lower hila on the lateral image. 4. Stable mild cardiomegaly without evidence of pulmonary edema. Electronically Signed   By: Evangeline Dakin M.D.   On: 01/19/2017 12:29   Nm Pet Image Initial (pi) Skull Base To Thigh  Result Date: 01/08/2017 CLINICAL DATA:  Initial treatment strategy for left upper lobe lung mass. EXAM: NUCLEAR MEDICINE PET SKULL BASE TO THIGH TECHNIQUE: 6.8 mCi F-18 FDG was injected intravenously. Full-ring PET imaging was performed from the skull base to thigh after the radiotracer. CT data was  obtained and used for attenuation correction and anatomic localization. FASTING BLOOD GLUCOSE:  Value: 99 mg/dl COMPARISON:  CT on 12/04/2016 and 11/16/2016 FINDINGS: NECK:  No hypermetabolic lymph nodes or masses. CHEST: No hypermetabolic mediastinal or axillary lymph nodes identified. Mild hypermetabolic adenopathy seen in the left hilum, with SUV max measuring 6.7. Chronic interstitial lung disease is again seen in the peripheral lung zones bilaterally. Mild to moderate emphysema also noted. A 2.9 cm irregular mass is seen in the lingula, which has SUV max of 12.5. A new 11 mm pulmonary nodule seen in the  posterior right upper lobe on image 21/8, which has SUV max of 3.7. Given short time course of appearance since previous study approximately 1 month ago, this favors an inflammatory or infectious etiology over neoplasm. ABDOMEN/PELVIS: No abnormal hypermetabolic activity within the liver, pancreas, adrenal glands, or spleen. No hypermetabolic lymph nodes in the abdomen or pelvis. Diverticulosis predominately involves the sigmoid colon, without evidence of diverticulitis. Aortic atherosclerosis. IVC filter in place. Tiny nonobstructive calculus seen in lower pole of left kidney. SKELETON: No focal hypermetabolic bone lesions to suggest skeletal metastasis. IMPRESSION: Persistent 2.9 cm hypermetabolic lingular mass, suspicious for primary bronchogenic carcinoma. Mild hypermetabolic left hilar lymphadenopathy, highly suspicious for metastatic disease. No other sites of metastatic disease identified. New 11 mm hypermetabolic right upper lobe pulmonary nodule. Rapid appearance since recent CT approximately 1 month ago favors inflammatory or infectious etiology over neoplasm. Continued followup by chest CT recommended. Chronic interstitial lung disease and centrilobular emphysema. Colonic diverticulosis, without radiographic evidence of diverticulitis. Nonobstructing left nephrolithiasis. Aortic atherosclerosis.  Electronically Signed   By: Earle Gell M.D.   On: 01/08/2017 13:55    Labs:  CBC:  Recent Labs  11/16/16 1102 11/17/16 0454 11/20/16 01/29/17 0944  WBC 8.0 6.6 7.0 7.3  HGB 12.1 11.8* 12.4 13.7  HCT 37.6 36.3 36 42.2  PLT 159 174 256 185    COAGS:  Recent Labs  03/30/16 2020 11/13/16 2229 01/29/17 0944  INR 0.95 1.10 0.97    BMP:  Recent Labs  03/05/16 1730 03/30/16 2033  11/13/16 1917 11/15/16 0347 11/17/16 0454 11/20/16  NA 140 139  < > 136 142 140 140  K 4.8 4.6  < > 4.0 3.5 3.6 4.3  CL 105 105  --  106 111 103  --   CO2 29  --   --  25 26 31   --   GLUCOSE 99 89  --  123* 93 95  --   BUN 31* 33*  < > 18 9 10 17   CALCIUM 8.7*  --   --  8.2* 8.4* 8.5*  --   CREATININE 0.95 0.90  < > 0.75 0.60 0.53 0.6  GFRNONAA 53*  --   --  >60 >60 >60  --   GFRAA >60  --   --  >60 >60 >60  --   < > = values in this interval not displayed.  LIVER FUNCTION TESTS:  Recent Labs  03/05/16 1730 07/17/16 11/13/16 1917 11/15/16 0347  BILITOT 0.5  --  0.3 0.7  AST 27 21 24 20   ALT 19 11 17 17   ALKPHOS 82 77 74 54  PROT 6.4*  --  5.9* 5.4*  ALBUMIN 3.5  --  3.0* 2.7*    TUMOR MARKERS: No results for input(s): AFPTM, CEA, CA199, CHROMGRNA in the last 8760 hours.  Assessment and Plan:  Lung mass in the peripheral lingula, suspicious for primary bronchogenic carcinoma.  Will proceed with CT guided lung biopsy today by Dr. Laurence Ferrari.  Risks and benefits discussed with the patient including, but not limited to bleeding, hemoptysis, respiratory failure requiring intubation, infection, pneumothorax requiring chest tube placement, stroke from air embolism or even death.  All of the patient's questions were answered, patient is agreeable to proceed. Consent signed and in chart.  Thank you for this interesting consult.  I greatly enjoyed meeting Rebecca [name] and look forward to participating in their care.  A copy of this report was sent to the requesting provider on  this date.  Electronically Signed: Murrell Redden, PA-C 01/29/2017, 10:44 AM   I spent a total of  30 Minutes in face to face in clinical consultation, greater than 50% of which was counseling/coordinating care for  Lung biopsy.

## 2017-01-29 NOTE — Telephone Encounter (Signed)
Spoke with pt's son and he states the biopsy is on monday 04/04/17  at 11:10am and he wants to know if this is too early to come in for results at the appt scheduled for the same day at 1:30. The reason he is asking is because his sister wants to come from [place] and doesn't want to come if there are no results. I figured the results wouldn't be back but just wanted to run this by JN. Thanks.

## 2017-01-29 NOTE — Telephone Encounter (Signed)
Can we please reschedule her for either later in the week or next week sometime. Let me know but the biopsy probably won't be back until Wednesday so it should be no earlier than Thursday. Thanks.

## 2017-01-29 NOTE — Sedation Documentation (Signed)
Patient denies pain and is resting comfortably.  

## 2017-01-29 NOTE — Telephone Encounter (Signed)
Pt has been scheduled for 02/05/17 @ 2:15 with JN [name], [name] is aware and voiced his understanding. Nothing further needed.

## 2017-01-29 NOTE — Discharge Instructions (Signed)
Needle Biopsy of the Lung, Care After °This sheet gives you information about how to care for yourself after your procedure. Your health care provider may also give you more specific instructions. If you have problems or questions, contact your health care provider. °What can I expect after the procedure? °After the procedure, it is common to have: °· Soreness, pain, and tenderness where a tissue sample was taken (biopsy site). °· A cough. °· A sore throat. ° °Follow these instructions at home: °Biopsy site care °· Follow instructions from your health care provider about when to remove the bandage that was placed on the biopsy site. °· Keep the bandage dry until it has been removed. °· Check your biopsy site every day for signs of infection. Check for: °? More redness, swelling, or pain. °? More fluid or blood. °? Warmth to the touch. °? Pus or a bad smell. °General instructions °· Rest as directed by your health care provider. Ask your health care provider what activities are safe for you. °· Do not take baths, swim, or use a hot tub until your health care provider approves. °· Take over-the-counter and prescription medicines only as told by your health care provider. °· If you have airplane travel scheduled, talk with your health care provider about when it is safe for you to travel by airplane. °· It is up to you to get the results of your procedure. Ask your health care provider, or the department that is doing the procedure, when your results will be ready. °· Keep all follow-up visits as told by your health care provider. This is important. °Contact a health care provider if: °· You have more redness, swelling, or pain around your biopsy site. °· You have more fluid or blood coming from your biopsy site. °· Your biopsy site feels warm to the touch. °· You have pus or a bad smell coming from your biopsy site. °· You have a fever. °· You have pain that does not get better with medicine. °Get help right away  if: °· You have problems breathing. °· You have chest pain. °· You cough up blood. °· You faint. °· You have a fast heart rate. °Summary °· After a needle biopsy of the lung, it is common to have a cough, a sore throat, or soreness, pain, and tenderness where a tissue sample was taken (biopsy site). °· You should check your biopsy area every day for signs of infection, including pus or a bad smell, warmth, more fluid or blood, or more redness, swelling, or pain. °· You should not take baths, swim, or use a hot tub until your health care provider approves. °· It is up to you to get the results of your procedure. Ask your health care provider, or the department that is doing the procedure, when your results will be ready. °This information is not intended to replace advice given to you by your health care provider. Make sure you discuss any questions you have with your health care provider. °Document Released: 03/02/2007 Document Revised: 03/26/2016 Document Reviewed: 03/26/2016 °Elsevier Interactive Patient Education © 2017 [employer] ° ° °

## 2017-01-29 NOTE — Sedation Documentation (Signed)
Transported patient to short stay and spoke with Doctor regarding biopsy site. Bandage intact hematoma present and stated was present post CT. Continue to monitor and diet order able to have ice chips. Notified short stay nurse.

## 2017-01-30 DIAGNOSIS — R2681 Unsteadiness on feet: Secondary | ICD-10-CM | POA: Diagnosis not present

## 2017-01-30 DIAGNOSIS — M545 Low back pain: Secondary | ICD-10-CM | POA: Diagnosis not present

## 2017-01-30 DIAGNOSIS — M6281 Muscle weakness (generalized): Secondary | ICD-10-CM | POA: Diagnosis not present

## 2017-01-30 DIAGNOSIS — R41841 Cognitive communication deficit: Secondary | ICD-10-CM | POA: Diagnosis not present

## 2017-01-30 DIAGNOSIS — M25552 Pain in left hip: Secondary | ICD-10-CM | POA: Diagnosis not present

## 2017-02-02 ENCOUNTER — Ambulatory Visit: Payer: Self-pay | Admitting: Pulmonary Disease

## 2017-02-03 ENCOUNTER — Other Ambulatory Visit: Payer: Self-pay

## 2017-02-03 DIAGNOSIS — M6281 Muscle weakness (generalized): Secondary | ICD-10-CM | POA: Diagnosis not present

## 2017-02-03 DIAGNOSIS — R2681 Unsteadiness on feet: Secondary | ICD-10-CM | POA: Diagnosis not present

## 2017-02-03 DIAGNOSIS — C3492 Malignant neoplasm of unspecified part of left bronchus or lung: Secondary | ICD-10-CM

## 2017-02-03 DIAGNOSIS — R41841 Cognitive communication deficit: Secondary | ICD-10-CM | POA: Diagnosis not present

## 2017-02-03 DIAGNOSIS — M25552 Pain in left hip: Secondary | ICD-10-CM | POA: Diagnosis not present

## 2017-02-03 DIAGNOSIS — M545 Low back pain: Secondary | ICD-10-CM | POA: Diagnosis not present

## 2017-02-04 DIAGNOSIS — R41841 Cognitive communication deficit: Secondary | ICD-10-CM | POA: Diagnosis not present

## 2017-02-04 DIAGNOSIS — M6281 Muscle weakness (generalized): Secondary | ICD-10-CM | POA: Diagnosis not present

## 2017-02-04 DIAGNOSIS — M25552 Pain in left hip: Secondary | ICD-10-CM | POA: Diagnosis not present

## 2017-02-04 DIAGNOSIS — M545 Low back pain: Secondary | ICD-10-CM | POA: Diagnosis not present

## 2017-02-04 DIAGNOSIS — R2681 Unsteadiness on feet: Secondary | ICD-10-CM | POA: Diagnosis not present

## 2017-02-05 ENCOUNTER — Ambulatory Visit (INDEPENDENT_AMBULATORY_CARE_PROVIDER_SITE_OTHER): Payer: Medicare Other | Admitting: Pulmonary Disease

## 2017-02-05 ENCOUNTER — Encounter: Payer: Self-pay | Admitting: *Deleted

## 2017-02-05 ENCOUNTER — Encounter: Payer: Self-pay | Admitting: Pulmonary Disease

## 2017-02-05 ENCOUNTER — Other Ambulatory Visit (INDEPENDENT_AMBULATORY_CARE_PROVIDER_SITE_OTHER): Payer: Medicare Other

## 2017-02-05 VITALS — BP 124/74 | HR 76 | Ht 62.0 in | Wt 139.1 lb

## 2017-02-05 DIAGNOSIS — J849 Interstitial pulmonary disease, unspecified: Secondary | ICD-10-CM | POA: Diagnosis not present

## 2017-02-05 DIAGNOSIS — J9611 Chronic respiratory failure with hypoxia: Secondary | ICD-10-CM

## 2017-02-05 DIAGNOSIS — C3492 Malignant neoplasm of unspecified part of left bronchus or lung: Secondary | ICD-10-CM

## 2017-02-05 DIAGNOSIS — J439 Emphysema, unspecified: Secondary | ICD-10-CM

## 2017-02-05 LAB — C-REACTIVE PROTEIN: CRP: 1.3 mg/dL (ref 0.5–20.0)

## 2017-02-05 LAB — SEDIMENTATION RATE: Sed Rate: 19 mm/hr (ref 0–30)

## 2017-02-05 MED ORDER — UMECLIDINIUM-VILANTEROL 62.5-25 MCG/INH IN AEPB
1.0000 | INHALATION_SPRAY | Freq: Every day | RESPIRATORY_TRACT | 6 refills | Status: DC
Start: 2017-02-05 — End: 2017-02-06

## 2017-02-05 MED ORDER — UMECLIDINIUM-VILANTEROL 62.5-25 MCG/INH IN AEPB: 1.0000 | INHALATION_SPRAY | Freq: Every day | RESPIRATORY_TRACT | 6 refills | Status: AC

## 2017-02-05 NOTE — Progress Notes (Signed)
Subjective:    Patient ID: Rebecca [name], female    DOB: January 13, 1928, 83 y.o.   MRN: [id number]  C.C.:  Follow-up for NSCLC, ILD, Pulmonary Emphysema, Chronic Hypoxic Respiratory Failure, & H/O DVT/PE.  HPI NSCLC: Patient underwent CT-guided biopsy which showed squamous cell carcinoma the left lung.  ILD: Predominantly a UIP pattern on high-resolution CT imaging.  Pulmonary emphysema: Apical predominant & seen on CT imaging. Known history of tobacco use. Currently she is on Breo 100 daily.   Chronic Hypoxic Respiratory Failure: Prescribed oxygen at 2 L per min with exertion at last appointment. Patient has not been using oxygen with exertion. Reportedly she has had normal saturation measured at home.  H/O DVT/PE: Not a candidate for systemic anticoagulation. Status post prior IVC filter placement.  Review of Systems Unable to obtain with baseline memory loss/dementia.  Allergies  Allergen Reactions  . Macrodantin [Nitrofurantoin]   . Morphine And Related Other (See Comments)    Altered mental state   . Sulfa Antibiotics Other (See Comments)    Reaction: unknown    Current Outpatient Prescriptions on File Prior to Visit  Medication Sig Dispense Refill  . acetaminophen (TYLENOL) 325 MG tablet Take 650 mg by mouth every 4 (four) hours as needed for moderate pain (In addition to the scheduled dose).     Marland Kitchen acetaminophen (TYLENOL) 325 MG tablet Take 650 mg by mouth 2 (two) times daily.    Marland Kitchen amoxicillin (AMOXIL) 500 MG tablet Take by mouth as needed. Give 4 tablets one hour prior to procedure.    . Calcium Carbonate-Vitamin D (CALCIUM 600+D) 600-400 MG-UNIT tablet Take 1 tablet by mouth daily.    . Cholecalciferol (VITAMIN D3) 5000 units TABS Take 1 tablet by mouth daily. Take one tablet daily     . clonazePAM (KLONOPIN) 0.5 MG tablet Take one tablet by mouth 2 times a day at 8 am and 8 pm. 5 tablet 0  . donepezil (ARICEPT) 10 MG tablet Take 10 mg by mouth daily.    . DULoxetine  (CYMBALTA) 30 MG capsule Take 30 mg by mouth daily.     . fluticasone furoate-vilanterol (BREO ELLIPTA) 100-25 MCG/INH AEPB Inhale 1 puff into the lungs daily.    Marland Kitchen levothyroxine (SYNTHROID, LEVOTHROID) 100 MCG tablet Take 1 tablet (100 mcg total) by mouth daily before breakfast. For thyroid 90 tablet 3  . meloxicam (MOBIC) 7.5 MG tablet Take 7.5 mg by mouth daily as needed for pain.     . memantine (NAMENDA) 10 MG tablet Take 10 mg by mouth 2 (two) times daily.    . Multiple Vitamins-Minerals (THERA M PLUS PO) Take 1 tablet by mouth daily.     . Omega-3 1000 MG CAPS Take 1 capsule by mouth daily. Take one capsule every morning     . omeprazole (PRILOSEC) 20 MG capsule Take 20 mg by mouth at bedtime.    . phenytoin (DILANTIN) 100 MG ER capsule Take 100-200 mg by mouth See admin instructions. Take one capsule every morning; take 2 capsules at bedtime     . QUEtiapine (SEROQUEL) 25 MG tablet Take 12.5 mg by mouth daily. At 1 pm    . triamcinolone cream (KENALOG) 0.1 % Apply 1 application topically daily as needed (for rash).     . trolamine salicylate (ASPERCREME) 10 % cream Apply 1 application topically. Apply as needed     No current facility-administered medications on file prior to visit.     Past Medical History:  Diagnosis Date  . Anemia, unspecified 03/18/2011  . Anxiety state, unspecified 01/2011  . Blood in stool 06/15/2012  . Corns and callosities 07/01/2011  . Diverticulosis 02/2011  . Dizziness   . DJD (degenerative joint disease)   . Dysuria 06/17/2011  . Hemorrhoids 02/2011  . Low back pain   . Mitral valve problem thickened   thickened  . Osteoarthritis of both hands 10/09/2015  . Osteoporosis 02/2011  . Other abnormal blood chemistry 0/30/2012  . Other acquired deformity of toe 12/16/2011  . Pain in joint, site unspecified 01/2012  . 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 Sacroiliitis, not elsewhere classified ([place]) 05/2011  . Seizures ([place])     Remotely  . Unspecified hypothyroidism 01/2011  . 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 Vitamin D deficiency 01/2011    Past Surgical History:  Procedure Laterality Date  . bletheroplasty    . CATARACT EXTRACTION W/ INTRAOCULAR LENS  IMPLANT, BILATERAL  2010  . CERVICAL LAMINECTOMY  2005  . CYSTOSCOPY WITH RETROGRADE PYELOGRAM, URETEROSCOPY AND STENT PLACEMENT Bilateral 11/16/2016   Procedure: CYSTOSCOPY WITH RETROGRADE PYELOGRAMS/CLOT EVACUATION;  Surgeon: Irine Seal, MD;  Location: [facility];  Service: Urology;  Laterality: Bilateral;  . HAMMER TOE SURGERY  2013   right 2nd toe Dr. Mallie Mussel  . IVC FILTER PLACEMENT ([facility] HX)    . JOINT REPLACEMENT Bilateral 2002 Right, 1992 Left   knees  . TRANSURETHRAL RESECTION OF BLADDER TUMOR N/A 11/16/2016   Procedure: TRANSURETHRAL RESECTION OF BLADDER TUMOR (TURBT);  Surgeon: Irine Seal, MD;  Location: [facility];  Service: Urology;  Laterality: N/A;    Family History  Problem Relation Age of Onset  . Alzheimer's disease Sister   . Emphysema Sister   . Emphysema Father   . Rheumatologic disease Neg Hx     Social History   Social History  . Marital status: Widowed    Spouse name: N/A  . Number of children: N/A  . Years of education: N/A   Social History Main Topics  . Smoking status: Former Smoker    Packs/day: 0.50    Years: 50.00    Quit date: 09/14/1988  . Smokeless tobacco: Never Used     Comment: Smoked <1ppd  . Alcohol use 0.6 oz/week    1 Glasses of wine per week     Comment: doesn't drink routinely & only on social occasions  . Drug use: No  . Sexual activity: No   Other Topics Concern  . None   Social History Narrative   L[facility] at [facility] since 10/07/2010, moved to [place] 01/16/2015   Widowed   Exercise class 3 times a week     Never smoked   Alcholol wine social    POA, Living Will, DNR      [place] [facility]:   Primary MPOA:  Christell Faith (Daughter) - [phone]   Secondary  MPOA:  [facility]) - [phone]      [facility] (11/28/16):   Originally from [facility]. Lived in [place] for 2 years from 1960-1961. No pets currently. No bird or mold exposure. 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       Objective:   Physical Exam BP 124/74 (BP Location: Right Arm, Cuff Size: Normal)   Pulse 76   Ht '5\' 2"'  (1.575 m)   Wt 139 lb 2 oz (63.1 kg)   SpO2 90%   BMI 25.45 kg/m   General:  Awake. Alert. Accompanied by son and daughter today.  Integument:  Warm & dry. No rash on exposed skin.  Extremities:  No cyanosis or clubbing.  HEENT:  No nasal turbinate swelling. No scleral icterus. Moist mucous membranes. Cardiovascular:  Regular rate. No edema. Unable to appreciate JVD.  Pulmonary:  Very faint bilateral basilar crackles. Normal work of breathing on room air. Abdomen: Soft. Normal bowel sounds. Nondistended.  Musculoskeletal:  Normal bulk and tone. No joint effusion appreciated.  WALK TEST 11/28/16:  Walked 1.25 laps / Baseline Sat 99% on RA / Nadir Sat 84% on RA (requred 2 L/m to maintain with ambulation)  IMAGING PET CT 01/08/17 (per radiologist):  Persistent 2.9 cm hypermetabolic lingular mass, suspicious for primary bronchogenic carcinoma. Mild hypermetabolic left hilar lymphadenopathy, highly suspicious for metastatic disease. No other sites of metastatic disease identified. New 11 mm hypermetabolic right upper lobe pulmonary nodule. Rapid appearance since recent CT approximately 1 month ago favors inflammatory or infectious etiology over neoplasm. Continued followup by chest CT recommended. Chronic interstitial lung disease and centrilobular emphysema.  Colonic diverticulosis, without radiographic evidence of diverticulitis. Nonobstructing left nephrolithiasis. Aortic atherosclerosis.  HRCT CHEST W/O 12/04/16 (personally reviewed by me):  Questionable area of cavitation versus adjacent subpleural bleb in the lingula with associated 3.2 cm  spiculated mass. Basilar predominant intralobular septal thickening and reticulation consistent with interstitial lung disease. There is a suggestion of a craniocaudal gradient. Questionable early honeycombing. Traction bronchiectasis is present. No groundglass opacities. Apical predominant emphysematous changes are noted. Small hiatal hernia. No pathologic mediastinal adenopathy. No pleural effusion or thickening. No pericardial effusion.  CXR PA/LAT 11/15/16 (previously reviewed by me):  No pleural effusion appreciated. Heart normal in size & mediastinum normal in contour. Low lung volumes. Bilateral increased interstitial markings and reticulation concerning for possible interstitial lung disease. No appreciable mass or opacity appreciated.  BILATERAL LOWER EXTREMITY VENOUS DUPLEX 04/11/16 (per radiology):  No DVT.  BILATERAL LOWER EXTREMITY VENOUS DUPLEX 03/28/15 (per vascular surgery):  Mobile thrombus (DVT) is noted in the left proximal femoral vein extending into the left common femoral vein. Also noted, occlusive deep vein thrombosis of bilateral posterior tibial, peroneal, and soleal veins.  CT CHEST W/O 03/16/15 (previously reviewed by me):  Diffuse patchy groundglass opacities with intralobular septal thickening and some suggestion of traction bronchiectasis versus consolidation with air bronchograms. Bilateral pleural effusions right greater than left. No obvious pleural thickening. No pericardial effusion. Questionable precarinal enlarged lymph nodes.  CARDIAC TTE (03/28/15):  LV normal in size with mild LVH. EF 60-65% without wall motion abnormality & grade 1 diastolic dysfunction. LA normal in size & RA mildly dilated. RV mildly dilated with preserved systolic function. No aortic stenosis or regurgitation. Aortic root normal in size. No mitral stenosis or regurgitation. No pulmonic regurgitation. Mild tricuspid regurgitation. No pericardial effusion.  PATHOLOGY CT-GUIDED LEFT LUNG BIOPSY  (01/29/17):  Squamous Lung Cancer  LABS 11/28/16 CRP: 1.1 ESR: 44 ANA: Negative Anti-CCP:  <16 RA:  <14    Assessment & Plan:  83 y.o. female with interstitial lung disease as well as pulmonary emphysema. Given her history of tobacco use this is certainly a possible cause for her emphysema, but we will be performing screening for alpha-1 antitrypsin deficiency today. I reviewed her high-resolution CT scan showing not only her left sided squamous cell carcinoma but also her interstitial lung disease. Patient and family wished to go ahead with consideration of treatment of her underlying lung cancer as well as further investigation as to potential causes  for her interstitial lung disease. I did discuss the possibility that medical and radiation oncology may not recommend treatment given her lung disease and other comorbid medical conditions. I instructed the patient and her family to contact me if they have any further questions or concerns before their next appointment.  1. NSCLC:  Referring patient to multidisciplinary thoracic oncology clinic for evaluation. 2. ILD:  Performing serum autoimmune workup today. 3. Pulmonary emphysema: Screening for alpha-1 antitrypsin deficiency. Switching from Bree O2 Anoro. 4. Chronic hypoxic respiratory failure: Patient family counseled to use oxygen as previously prescribed with exertion. Ordering portable oxygen concentrator. 5. H/O DVT/PE: Status post IVC filter placement. 6. Health maintenance: Status post Prevnar vaccine August 2018 & Pneumovax January 2015. Administering high-dose influenza vaccine today. 7. Follow-up: Return to clinic in 3 months or sooner if needed.  Sonia Baller Ashok Cordia, M.D. [facility] Pager:  [phone] After 3pm or if no response, call [phone] 2:28 PM 02/05/17

## 2017-02-05 NOTE — Patient Instructions (Addendum)
   Call our office if you have any new breathing problems or questions before your next appointment.  We are switching your Breo over to Anoro. Call me if you have any problems with this medication.  We are going to have you see the cancer specialists as soon as possible.  TESTS ORDERED: 1. Serum alpha-1 antitrypsin phenotype, ANA, ESR, CRP, Myositis Panel, Smith Antibody, DS DNA Antibody, Sjogrens Panel, Anti-CCP, Rheumatoid Factor, Centromere Antibody Screen, SCL-70, Histone Antibody, & Hypersensitivity Pneumonitis Panel.

## 2017-02-05 NOTE — Progress Notes (Signed)
Oncology Nurse Navigator Documentation  Oncology Nurse Navigator Flowsheets 02/05/2017  Navigator Location [facility]  Referral date to RadOnc/MedOnc 02/05/2017  Navigator Encounter Type Other/I received an in basket message from Dr. Ashok Cordia. He would like patient be seen by Rad Onc and Dr. Julien Nordmann. I completed referral to Rad Onc and will updated new patient   Treatment Phase Pre-Tx/Tx Discussion  Barriers/Navigation Needs Coordination of Care  Interventions Coordination of Care  Coordination of Care Other  Acuity Level 2  Acuity Level 2 Referrals such as genetics, survivorship  Time Spent with Patient 30

## 2017-02-06 ENCOUNTER — Encounter: Payer: Self-pay | Admitting: Family

## 2017-02-06 ENCOUNTER — Telehealth: Payer: Self-pay | Admitting: Pulmonary Disease

## 2017-02-06 ENCOUNTER — Non-Acute Institutional Stay: Payer: Medicare Other | Admitting: Family

## 2017-02-06 DIAGNOSIS — W19XXXA Unspecified fall, initial encounter: Secondary | ICD-10-CM

## 2017-02-06 DIAGNOSIS — Y92129 Unspecified place in nursing home as the place of occurrence of the external cause: Secondary | ICD-10-CM

## 2017-02-06 DIAGNOSIS — R2681 Unsteadiness on feet: Secondary | ICD-10-CM | POA: Diagnosis not present

## 2017-02-06 NOTE — Progress Notes (Signed)
Location:  [facility] Room Number: 36 Place of Service:  ALF [phone]) Provider: Colburn Asper FNP-C  [name], MD  Patient Care Team: [name], MD as PCP - General (Internal Medicine) [name], [facility] [name], MD as Consulting Physician (Orthopedic Surgery) [name], MD as Consulting Physician (Physical Medicine and Rehabilitation) Melina Schools, MD as Consulting Physician (Orthopedic Surgery) [name], MD as Consulting Physician (Otolaryngology) Canon Gola, [name], NP as Nurse Practitioner (Family Medicine)  Extended Emergency Contact Information Primary Emergency Contact: [name] Address: [address]          [name] of [place] Phone: [phone] Relation: Son Secondary Emergency Contact: [name] [place] Phone: [phone] Mobile Phone: [phone] Relation: Daughter  Code Status:  DNR Goals of care: Advanced Directive information Advanced Directives 02/06/2017  Does Patient Have a Medical Advance Directive? Yes  Type of Advance Directive Living will;Healthcare Power of Attorney  Does patient want to make changes to medical advance directive? -  Copy of [place] in Chart? Yes  Pre-existing out of facility DNR order (yellow form or pink MOST form) -     Chief Complaint  Patient presents with  . Acute Visit    fall    HPI:  Pt is a 83 y.o. female seen today at [facility] for an acute visit for evaluation of fall.She is seen in her room per facility request.Nurse reports patient sustained a fall when she was trying to sit down and missed the chair. She crawled to the door to get help.No acute injuries sustained. She denies any pain or hitting head on the floor.Also denies any fever,chills, dizziness or urinary tract infection symptoms. She has been up walking to meals and facility activities without any difficulties. She was seen  02/05/2017 by Pulmonary Dr. Sonia Baller. Nest[place] for chronic hypoxia respiratory failure, emphysema and NSCLC. Her recent CT-guided biopsy which showed squamous cell carcinoma of  the left lung.Patient referred to Radiology Oncology. Awaiting appointment.     Past Medical History:  Diagnosis Date  . Anemia, unspecified 03/18/2011  . Anxiety state, unspecified 01/2011  . Blood in stool 06/15/2012  . Corns and callosities 07/01/2011  . Diverticulosis 02/2011  . Dizziness   . DJD (degenerative joint disease)   . Dysuria 06/17/2011  . Hemorrhoids 02/2011  . Low back pain   . Mitral valve problem thickened   thickened  . Osteoarthritis of both hands 10/09/2015  . Osteoporosis 02/2011  . Other abnormal blood chemistry 0/30/2012  . Other acquired deformity of toe 12/16/2011  . Pain in joint, site unspecified 01/2012  . 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 Sacroiliitis, not elsewhere classified ([place]) 05/2011  . Seizures ([place])    Remotely  . Unspecified hypothyroidism 01/2011  . 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 Vitamin D deficiency 01/2011   Past Surgical History:  Procedure Laterality Date  . bletheroplasty    . CATARACT EXTRACTION W/ INTRAOCULAR LENS  IMPLANT, BILATERAL  2010  . CERVICAL LAMINECTOMY  2005  . CYSTOSCOPY WITH RETROGRADE PYELOGRAM, URETEROSCOPY AND STENT PLACEMENT Bilateral 11/16/2016   Procedure: CYSTOSCOPY WITH RETROGRADE PYELOGRAMS/CLOT EVACUATION;  Surgeon: Irine Seal, MD;  Location: [facility];  Service: Urology;  Laterality: Bilateral;  . HAMMER TOE SURGERY  2013   right 2nd toe Dr. Mallie Mussel  . IVC FILTER PLACEMENT ([facility] HX)    . JOINT REPLACEMENT Bilateral 2002 Right,  1992 Left   knees  . TRANSURETHRAL RESECTION OF BLADDER TUMOR N/A 11/16/2016   Procedure: TRANSURETHRAL RESECTION OF BLADDER TUMOR (TURBT);  Surgeon: Irine Seal, MD;  Location: [facility];  Service: Urology;  Laterality: N/A;    Allergies  Allergen  Reactions  . Macrodantin [Nitrofurantoin]   . Morphine And Related Other (See Comments)    Altered mental state   . Sulfa Antibiotics Other (See Comments)    Reaction: unknown    Outpatient Encounter Prescriptions as of 02/06/2017  Medication Sig  . acetaminophen (TYLENOL) 325 MG tablet Take 650 mg by mouth 3 (three) times daily.   Marland Kitchen acetaminophen (TYLENOL) 325 MG tablet Take 650 mg by mouth 2 (two) times daily.  Marland Kitchen amoxicillin (AMOXIL) 500 MG tablet Take by mouth as needed. Give 4 tablets one hour prior to procedure.  . Calcium Carbonate-Vitamin D (CALCIUM 600+D) 600-400 MG-UNIT tablet Take 1 tablet by mouth daily.  . Cholecalciferol (VITAMIN D3) 5000 units TABS Take 1 tablet by mouth daily. Take one tablet daily   . clonazePAM (KLONOPIN) 0.5 MG tablet Take 0.5 mg by mouth as directed. Take one tablet every morning scheduled and then at bedtime as needed.  . donepezil (ARICEPT) 10 MG tablet Take 10 mg by mouth daily.  . DULoxetine (CYMBALTA) 30 MG capsule Take 30 mg by mouth daily.   . fluticasone furoate-vilanterol (BREO ELLIPTA) 100-25 MCG/INH AEPB Inhale 1 puff into the lungs daily.  Marland Kitchen levothyroxine (SYNTHROID, LEVOTHROID) 100 MCG tablet Take 1 tablet (100 mcg total) by mouth daily before breakfast. For thyroid  . meloxicam (MOBIC) 7.5 MG tablet Take 7.5 mg by mouth daily as needed for pain.   . memantine (NAMENDA) 10 MG tablet Take 10 mg by mouth 2 (two) times daily.  . Multiple Vitamins-Minerals (THERA M PLUS PO) Take 1 tablet by mouth daily.   . Omega-3 1000 MG CAPS Take 1 capsule by mouth daily. Take one capsule every morning   . omeprazole (PRILOSEC) 20 MG capsule Take 20 mg by mouth at bedtime.  . phenytoin (DILANTIN) 100 MG ER capsule Take 100-200 mg by mouth See admin instructions. Take one capsule every morning; take 2 capsules at bedtime   . QUEtiapine (SEROQUEL) 25 MG tablet Take 12.5 mg by mouth daily. At 1 pm  . triamcinolone cream (KENALOG) 0.1 % Apply 1 application  topically daily as needed (for rash).   . trolamine salicylate (ASPERCREME) 10 % cream Apply 1 application topically. Apply as needed  . umeclidinium-vilanterol (ANORO ELLIPTA) 62.5-25 MCG/INH AEPB Inhale 1 puff into the lungs daily.  . [DISCONTINUED] clonazePAM (KLONOPIN) 0.5 MG tablet Take one tablet by mouth 2 times a day at 8 am and 8 pm.  . [DISCONTINUED] umeclidinium-vilanterol (ANORO ELLIPTA) 62.5-25 MCG/INH AEPB Inhale 1 puff into the lungs daily.   No facility-administered encounter medications on file as of 02/06/2017.     Review of Systems  Constitutional: Negative for activity change, chills, fatigue and fever.  HENT: Negative for congestion, rhinorrhea, sinus pain, sinus pressure, sneezing and sore throat.   Eyes:       Wears eye glasses   Respiratory: Negative for cough, chest tightness and wheezing.   Cardiovascular: Negative for chest pain, palpitations and leg swelling.  Gastrointestinal: Negative for abdominal distention, abdominal pain, constipation, diarrhea, nausea and vomiting.  Genitourinary: Negative for dysuria, flank pain, frequency and urgency.  Musculoskeletal: Positive for arthralgias and gait problem.  Skin: Negative for color change, pallor and rash.  Neurological: Negative for dizziness, seizures,  syncope, light-headedness and headaches.  Psychiatric/Behavioral: Positive for confusion. Negative for agitation, hallucinations and sleep disturbance. The patient is not nervous/anxious.     Immunization History  Administered Date(s) Administered  . Influenza Whole 03/17/2007, 02/16/2009, 01/30/2010, 02/16/2013  . Influenza-Unspecified 03/02/2014, 02/22/2015, 02/28/2016  . PPD Test 07/09/2010, 02/04/2015, 04/04/2015, 06/04/2015  . Pneumococcal Conjugate-13 01/13/2017  . Pneumococcal Polysaccharide-23 05/19/2005, 05/23/2013  . Td 05/19/2005  . Tetanus 10/06/2012   Pertinent  Health Maintenance Due  Topic Date Due  . INFLUENZA VACCINE  02/16/2017 (Originally  12/17/2016)  . DEXA SCAN  05/19/2017 (Originally 11/19/1994)  . PNA vac Low Risk Adult  Completed   Fall Risk  01/05/2017 10/09/2015 07/10/2015 03/26/2015 12/19/2014  Falls in the past year? No No Yes Yes Yes  Number falls in past yr: - - 1 1 1   Injury with Fall? - - Yes No Yes  Comment - - scape on her left leg - hurt on left side and sacral bone    Vitals:   02/06/17 1337  BP: 134/78  Pulse: 76  Resp: (!) 24  Temp: 97.6 F (36.4 C)  SpO2: 91%  Weight: 138 lb 9.6 oz (62.9 kg)  Height: 5\' 2"  (1.575 m)   Body mass index is 25.35 kg/m. Physical Exam  Constitutional: She appears well-developed and well-nourished.  Pleasantly confused at her baseline  HENT:  Head: Normocephalic.  Mouth/Throat: Oropharynx is clear and moist. No oropharyngeal exudate.  Eyes: Pupils are equal, round, and reactive to light. Conjunctivae and EOM are normal. Right eye exhibits no discharge. Left eye exhibits no discharge. No scleral icterus.  Neck: Normal range of motion. No JVD present. No thyromegaly present.  Cardiovascular: Intact distal pulses.  Exam reveals no gallop and no friction rub.   Murmur heard. Pulmonary/Chest: Effort normal and breath sounds normal. No respiratory distress. She has no wheezes.  Abdominal: Soft. Bowel sounds are normal. She exhibits no distension. There is no tenderness. There is no rebound and no guarding.  Musculoskeletal: She exhibits no edema or tenderness.  Moves x 4 extremities.gait unsteady uses FWW but forgets it at times. Arthritic changes to her fingers.   Lymphadenopathy:    She has no cervical adenopathy.  Neurological: She is alert. Coordination normal.  Pleasantly confused at her baseline. Forgetful.   Skin: Skin is warm and dry. No rash noted. No erythema. No pallor.  No bruises noted.   Psychiatric: She has a normal mood and affect.   Labs reviewed:  Recent Labs  11/13/16 1917 11/15/16 0347 11/17/16 0454 11/20/16  NA 136 142 140 140  K 4.0 3.5 3.6 4.3    CL 106 111 103  --   CO2 25 26 31   --   GLUCOSE 123* 93 95  --   BUN 18 9 10 17   CREATININE 0.75 0.60 0.53 0.6  CALCIUM 8.2* 8.4* 8.5*  --     Recent Labs  03/05/16 1730 07/17/16 11/13/16 1917 11/15/16 0347  AST 27 21 24 20   ALT 19 11 17 17   ALKPHOS 82 77 74 54  BILITOT 0.5  --  0.3 0.7  PROT 6.4*  --  5.9* 5.4*  ALBUMIN 3.5  --  3.0* 2.7*    Recent Labs  03/05/16 1730  11/13/16 1917  11/16/16 1102 11/17/16 0454 11/20/16 01/29/17 0944  WBC 5.9  < > 11.3*  < > 8.0 6.6 7.0 7.3  NEUTROABS 3.9  --  9.5*  --   --   --   --   --  HGB 12.6  < > 12.7  < > 12.1 11.8* 12.4 13.7  HCT 39.4  < > 39.0  < > 37.6 36.3 36 42.2  MCV 99.5  --  97.7  < > 97.7 96.0  --  99.1  PLT 173  < > 136*  < > 159 174 256 185  < > = values in this interval not displayed. Lab Results  Component Value Date   TSH 0.533 11/13/2016   Lab Results  Component Value Date   HGBA1C 5.4 03/08/2015   Lab Results  Component Value Date   CHOL 235 (H) 03/06/2009   HDL 108.40 03/06/2009   LDLDIRECT 108.2 03/06/2009    Significant Diagnostic Results in last 30 days:  Dg Chest 2 View  Result Date: 01/19/2017 CLINICAL DATA:  83 year old with known lingular mass suspicious for bronchogenic carcinoma, presenting with acute onset of right-sided chest pain that began today while the patient was exercising. Former long-time smoker. EXAM: CHEST  2 VIEW COMPARISON:  PET-CT 01/08/2017. High-resolution CT chest 12/04/2016. Chest x-rays 11/13/2016, 04/02/2015 and earlier. FINDINGS: AP semi-erect and lateral images were obtained. The mass involving the lingula identified on the July, 2018 CT is not well demonstrated on the chest x-ray, best seen on the lateral image projected over the lower hila. Severe chronic interstitial lung disease associated with low lung volumes is stable dating back to at least 2016. No new pulmonary parenchymal abnormality. No pleural effusions. Cardiac silhouette mildly enlarged, unchanged.  Thoracic aorta atherosclerotic, unchanged. Hilar and mediastinal contours otherwise unremarkable. Degenerative changes throughout the thoracic and upper lumbar spine. Thoracolumbar levoscoliosis. IVC filter. IMPRESSION: 1.  No acute cardiopulmonary disease. 2. Stable chronic interstitial lung disease associated with low lung volumes, likely usual interstitial pneumonitis as described on the prior high-resolution CT chest. 3. The lingular mass identified on the prior CT is difficult to visualize on the chest x-ray but projects over the lower hila on the lateral image. 4. Stable mild cardiomegaly without evidence of pulmonary edema. Electronically Signed   By: Evangeline Dakin M.D.   On: 01/19/2017 12:29   Nm Pet Image Initial (pi) Skull Base To Thigh  Result Date: 01/08/2017 CLINICAL DATA:  Initial treatment strategy for left upper lobe lung mass. EXAM: NUCLEAR MEDICINE PET SKULL BASE TO THIGH TECHNIQUE: 6.8 mCi F-18 FDG was injected intravenously. Full-ring PET imaging was performed from the skull base to thigh after the radiotracer. CT data was obtained and used for attenuation correction and anatomic localization. FASTING BLOOD GLUCOSE:  Value: 99 mg/dl COMPARISON:  CT on 12/04/2016 and 11/16/2016 FINDINGS: NECK:  No hypermetabolic lymph nodes or masses. CHEST: No hypermetabolic mediastinal or axillary lymph nodes identified. Mild hypermetabolic adenopathy seen in the left hilum, with SUV max measuring 6.7. Chronic interstitial lung disease is again seen in the peripheral lung zones bilaterally. Mild to moderate emphysema also noted. A 2.9 cm irregular mass is seen in the lingula, which has SUV max of 12.5. A new 11 mm pulmonary nodule seen in the posterior right upper lobe on image 21/8, which has SUV max of 3.7. Given short time course of appearance since previous study approximately 1 month ago, this favors an inflammatory or infectious etiology over neoplasm. ABDOMEN/PELVIS: No abnormal hypermetabolic  activity within the liver, pancreas, adrenal glands, or spleen. No hypermetabolic lymph nodes in the abdomen or pelvis. Diverticulosis predominately involves the sigmoid colon, without evidence of diverticulitis. Aortic atherosclerosis. IVC filter in place. Tiny nonobstructive calculus seen in lower pole of left kidney. SKELETON:  No focal hypermetabolic bone lesions to suggest skeletal metastasis. IMPRESSION: Persistent 2.9 cm hypermetabolic lingular mass, suspicious for primary bronchogenic carcinoma. Mild hypermetabolic left hilar lymphadenopathy, highly suspicious for metastatic disease. No other sites of metastatic disease identified. New 11 mm hypermetabolic right upper lobe pulmonary nodule. Rapid appearance since recent CT approximately 1 month ago favors inflammatory or infectious etiology over neoplasm. Continued followup by chest CT recommended. Chronic interstitial lung disease and centrilobular emphysema. Colonic diverticulosis, without radiographic evidence of diverticulitis. Nonobstructing left nephrolithiasis. Aortic atherosclerosis. Electronically Signed   By: Earle Gell M.D.   On: 01/08/2017 13:55   Ct Biopsy  Result Date: 01/29/2017 INDICATION: 83 year old female with a hypermetabolic lingular nodule concerning for primary bronchogenic carcinoma. She presents for CT-guided biopsy to confirm tissue diagnosis. EXAM: CT-guided biopsy left upper lobe pulmonary nodule Interventional Radiologist:  Criselda Peaches, MD MEDICATIONS: None. ANESTHESIA/SEDATION: Fentanyl 50 mcg IV; Versed 0.5 mg IV Moderate Sedation Time:  14 minutes The patient was continuously monitored during the procedure by the interventional radiology nurse under my direct supervision. FLUOROSCOPY TIME:  Fluoroscopy Time: 0 minutes 0 seconds (0 mGy). COMPLICATIONS: None immediate. Estimated blood loss:  0 PROCEDURE: Informed written consent was obtained from the patient after a thorough discussion of the procedural risks,  benefits and alternatives. All questions were addressed. Maximal Sterile Barrier Technique was utilized including caps, mask, sterile gowns, sterile gloves, sterile drape, hand hygiene and skin antiseptic. A timeout was performed prior to the initiation of the procedure. A planning axial CT scan was performed. The nodule in the lingula was successfully identified. A suitable skin entry site was selected and marked. The region was then sterilely prepped and draped in standard fashion using Betadine skin prep. Local anesthesia was attained by infiltration with 1% lidocaine. A small dermatotomy was made. Under intermittent CT fluoroscopic guidance, a 17 gauge trocar needle was advanced into the lung and positioned at the margin of the nodule. Multiple 18 gauge core biopsies were then coaxially obtained using the BioPince automated biopsy device. Biopsy specimens were placed in formalin and delivered to pathology for further analysis. The biopsy device and introducer needle were removed. A BioSentry device was deployed. Post biopsy axial CT imaging demonstrates no evidence of immediate complication. There is no pneumothorax. Mild perilesional alveolar hemorrhage is not unexpected. The patient tolerated the procedure well. IMPRESSION: Technically successful CT-guided biopsy left upper lobe (lingular) pulmonary nodule. Electronically Signed   By: Jacqulynn Cadet M.D.   On: 01/29/2017 17:39   Dg Chest Port 1 View  Result Date: 01/29/2017 CLINICAL DATA:  Lung biopsy. EXAM: PORTABLE CHEST 1 VIEW COMPARISON:  CT 01/29/2017.  Chest x-ray 01/19/2017. FINDINGS: Mediastinum hilar structures are normal. Chronic interstitial changes are present. Left lung mass best identified by prior CT. No pneumothorax post biopsy identified. Stable cardiomegaly. Normal pulmonary vascularity. Degenerative change thoracic spine . IMPRESSION: No pneumothorax post biopsy identified . Electronically Signed   By: Marcello Moores  Register   On:  01/29/2017 15:06   Assessment/Plan 1. Unstable gait Uses FWW though tends to forget walker at times.she remains a high risk for  Currently working with Therapy. Will continue PT/OT for exercise, ROM and muscle strengthening.Continue fall and safety precautions.   2. Fall at nursing home Missed the chair while trying to sit down sustaining fall. No acute pain or bruises noted on exam. Has been up participating in the facility activities without any difficulties.Continue with therapy for now. Her cognitive impairment contributing to her falls.Continue fall and safety precautions.  Family/ staff Communication: Reviewed plan of care with patient and facility Nurse supervisor  Labs/tests ordered: None   Sandrea Hughs, NP

## 2017-02-06 NOTE — Telephone Encounter (Signed)
Spoke with Bethanne Ginger, advised I would FYI this message and send to Dr. Ashok Cordia. Bethanne Ginger states she is confused if she is in skilled facility or assisted living. If she in assisted living then she has to re-activate her for her to keep oxygen. She is going to get the manager to figure out where pt is so she can bill appropriately. Nothing further is needed.

## 2017-02-09 ENCOUNTER — Other Ambulatory Visit: Payer: Self-pay

## 2017-02-09 ENCOUNTER — Encounter: Payer: Self-pay | Admitting: Radiation Oncology

## 2017-02-09 ENCOUNTER — Telehealth: Payer: Self-pay | Admitting: Internal Medicine

## 2017-02-09 DIAGNOSIS — R2681 Unsteadiness on feet: Secondary | ICD-10-CM | POA: Diagnosis not present

## 2017-02-09 DIAGNOSIS — M545 Low back pain: Secondary | ICD-10-CM | POA: Diagnosis not present

## 2017-02-09 DIAGNOSIS — M6281 Muscle weakness (generalized): Secondary | ICD-10-CM | POA: Diagnosis not present

## 2017-02-09 DIAGNOSIS — R41841 Cognitive communication deficit: Secondary | ICD-10-CM | POA: Diagnosis not present

## 2017-02-09 DIAGNOSIS — M25552 Pain in left hip: Secondary | ICD-10-CM | POA: Diagnosis not present

## 2017-02-09 LAB — CENTROMERE ANTIBODIES: Centromere Ab Screen: <1 AI

## 2017-02-09 MED ORDER — CLONAZEPAM 0.5 MG PO TABS: 0.5000 mg | ORAL_TABLET | ORAL | 0 refills | Status: DC

## 2017-02-09 NOTE — Telephone Encounter (Signed)
Spoke to the pt's son to schedule an appt for his mother. Appt has been scheduled for the pt to see Dr. Julien Nordmann on 10/10 at 215pm. Aware to arrive 30 minutes early for labs.

## 2017-02-10 LAB — RHEUMATOID FACTOR

## 2017-02-10 LAB — SJOGRENS SYNDROME-B EXTRACTABLE NUCLEAR ANTIBODY: SSB (La) (ENA) Antibody, IgG: <1 AI

## 2017-02-10 LAB — ANTI-DNA ANTIBODY, DOUBLE-STRANDED: ds DNA Ab: 1 IU/mL

## 2017-02-10 LAB — HISTONE ANTIBODIES, IGG, BLOOD: HISTONE AB: 1.7 U — AB (ref <1.0)

## 2017-02-10 LAB — CYCLIC CITRUL PEPTIDE ANTIBODY, IGG: Cyclic Citrullin Peptide Ab: <16 UNITS

## 2017-02-10 LAB — ANA: ANA: NEGATIVE

## 2017-02-10 LAB — ALPHA-1 ANTITRYPSIN PHENOTYPE: A1 ANTITRYPSIN SER: 196 mg/dL (ref 83–199)

## 2017-02-10 LAB — ANTI-SMITH ANTIBODY: ENA SM AB SER-ACNC: NEGATIVE AI

## 2017-02-10 LAB — SJOGRENS SYNDROME-A EXTRACTABLE NUCLEAR ANTIBODY: SSA (Ro) (ENA) Antibody, IgG: <1 AI

## 2017-02-10 LAB — ANTI-SCLERODERMA ANTIBODY: Scleroderma (Scl-70) (ENA) Antibody, IgG: <1 AI

## 2017-02-11 DIAGNOSIS — M6281 Muscle weakness (generalized): Secondary | ICD-10-CM | POA: Diagnosis not present

## 2017-02-11 DIAGNOSIS — M545 Low back pain: Secondary | ICD-10-CM | POA: Diagnosis not present

## 2017-02-11 DIAGNOSIS — M25552 Pain in left hip: Secondary | ICD-10-CM | POA: Diagnosis not present

## 2017-02-11 DIAGNOSIS — R2681 Unsteadiness on feet: Secondary | ICD-10-CM | POA: Diagnosis not present

## 2017-02-11 DIAGNOSIS — R41841 Cognitive communication deficit: Secondary | ICD-10-CM | POA: Diagnosis not present

## 2017-02-12 DIAGNOSIS — M6281 Muscle weakness (generalized): Secondary | ICD-10-CM | POA: Diagnosis not present

## 2017-02-12 DIAGNOSIS — R2681 Unsteadiness on feet: Secondary | ICD-10-CM | POA: Diagnosis not present

## 2017-02-12 DIAGNOSIS — M545 Low back pain: Secondary | ICD-10-CM | POA: Diagnosis not present

## 2017-02-12 DIAGNOSIS — M25552 Pain in left hip: Secondary | ICD-10-CM | POA: Diagnosis not present

## 2017-02-12 DIAGNOSIS — R41841 Cognitive communication deficit: Secondary | ICD-10-CM | POA: Diagnosis not present

## 2017-02-13 ENCOUNTER — Telehealth: Payer: Self-pay | Admitting: Internal Medicine

## 2017-02-13 DIAGNOSIS — M545 Low back pain: Secondary | ICD-10-CM | POA: Diagnosis not present

## 2017-02-13 DIAGNOSIS — R2681 Unsteadiness on feet: Secondary | ICD-10-CM | POA: Diagnosis not present

## 2017-02-13 DIAGNOSIS — M25552 Pain in left hip: Secondary | ICD-10-CM | POA: Diagnosis not present

## 2017-02-13 DIAGNOSIS — R41841 Cognitive communication deficit: Secondary | ICD-10-CM | POA: Diagnosis not present

## 2017-02-13 DIAGNOSIS — M6281 Muscle weakness (generalized): Secondary | ICD-10-CM | POA: Diagnosis not present

## 2017-02-13 NOTE — Progress Notes (Addendum)
Thoracic Location of Tumor / Histology: squamous cell carcinoma of the left lung.  Patient presented for screening by a hospitalist in July.  Biopsies revealed:   01/29/17 Diagnosis Lung, needle/core biopsy(ies), Lingular Nodule - SQUAMOUS CELL CARCINOMA.  Tobacco/Marijuana/Snuff/ETOH use: former smoker, quit in 1990, smoked for 50 years, denies any ETOH use.  Past/Anticipated interventions by cardiothoracic surgery, if any: no  Past/Anticipated interventions by medical oncology, if any: apt with Dr. Julien Nordmann 02/25/17.  Signs/Symptoms  Weight changes, if any: no  Respiratory complaints, if any: {:has occasional shortness of breath and a dry cough, worse at night, for the past 8 months.  Hemoptysis, if any: no  Pain issues, if any:  no  SAFETY ISSUES:  Prior radiation? no  Pacemaker/ICD? no   Possible current pregnancy?no  Is the patient on methotrexate? no  Current Complaints / other details:  Patient is here with her son and daughter.  She has dementia and is hard of hearing.  BP (!) 101/58 (BP Location: Left Arm, Patient Position: Sitting)   Pulse 76   Temp 98.2 F (36.8 C) (Oral)   Ht 5\' 2"  (1.575 m)   Wt 139 lb 3.2 oz (63.1 kg)   SpO2 94%   BMI 25.46 kg/m    Wt Readings from Last 3 Encounters:  02/18/17 139 lb 3.2 oz (63.1 kg)  02/06/17 138 lb 9.6 oz (62.9 kg)  02/05/17 139 lb 2 oz (63.1 kg)

## 2017-02-13 NOTE — Telephone Encounter (Signed)
Called pt's son [name] to advise of appt time chg on 10/10 to 12.45. He asks about why appt with Dr. Sondra Come isn't the same day as appt with Dr. Vista Mink. I advised that we usually schedule these appts together and on Thurs. Nadara Mustard I would get with NP coord to see if these appts can be combined as his sister is coming from [place] to accompany pt.   [name] also asks that we speak with him and his sister Wilfred Lacy. I advised I would make note.

## 2017-02-18 ENCOUNTER — Ambulatory Visit
Admission: RE | Admit: 2017-02-18 | Discharge: 2017-02-18 | Disposition: A | Discharge status: Home / Self Care | Payer: Medicare Other | Source: Ambulatory Visit | Attending: Radiation Oncology | Admitting: Radiation Oncology

## 2017-02-18 ENCOUNTER — Telehealth: Payer: Self-pay | Admitting: *Deleted

## 2017-02-18 ENCOUNTER — Encounter: Payer: Self-pay | Admitting: Radiation Oncology

## 2017-02-18 DIAGNOSIS — C3490 Malignant neoplasm of unspecified part of unspecified bronchus or lung: Secondary | ICD-10-CM

## 2017-02-18 DIAGNOSIS — Z881 Allergy status to other antibiotic agents status: Secondary | ICD-10-CM | POA: Diagnosis not present

## 2017-02-18 DIAGNOSIS — M6281 Muscle weakness (generalized): Secondary | ICD-10-CM | POA: Diagnosis not present

## 2017-02-18 DIAGNOSIS — Z510 Encounter for antineoplastic radiation therapy: Secondary | ICD-10-CM | POA: Diagnosis not present

## 2017-02-18 DIAGNOSIS — Z87891 Personal history of nicotine dependence: Secondary | ICD-10-CM | POA: Insufficient documentation

## 2017-02-18 DIAGNOSIS — Z79899 Other long term (current) drug therapy: Secondary | ICD-10-CM | POA: Diagnosis not present

## 2017-02-18 DIAGNOSIS — Z9889 Other specified postprocedural states: Secondary | ICD-10-CM | POA: Diagnosis not present

## 2017-02-18 DIAGNOSIS — M545 Low back pain: Secondary | ICD-10-CM | POA: Diagnosis not present

## 2017-02-18 DIAGNOSIS — C3412 Malignant neoplasm of upper lobe, left bronchus or lung: Secondary | ICD-10-CM | POA: Diagnosis not present

## 2017-02-18 DIAGNOSIS — C3492 Malignant neoplasm of unspecified part of left bronchus or lung: Secondary | ICD-10-CM | POA: Insufficient documentation

## 2017-02-18 DIAGNOSIS — R296 Repeated falls: Secondary | ICD-10-CM | POA: Diagnosis not present

## 2017-02-18 DIAGNOSIS — R2681 Unsteadiness on feet: Secondary | ICD-10-CM | POA: Diagnosis not present

## 2017-02-18 HISTORY — DX: Unspecified dementia, unspecified severity, without behavioral disturbance, psychotic disturbance, mood disturbance, and anxiety: F03.90

## 2017-02-18 HISTORY — DX: Malignant neoplasm of unspecified part of unspecified bronchus or lung: C34.90

## 2017-02-18 LAB — MYOSITIS PANEL III
EJ: NEGATIVE
Jo-1 (WB)*: POSITIVE
KU: NEGATIVE
MI-2 ANTIBODIES: NEGATIVE
OJ*: NEGATIVE
PL-12*: NEGATIVE
PL-7*: NEGATIVE
PM-SCL 100: NEGATIVE
PM-SCL 75: NEGATIVE
RNP: 3 U/mL
RO-52: NEGATIVE
Signal Recognition Particle*: NEGATIVE

## 2017-02-18 LAB — HYPERSENSITIVITY PNEUMONITIS
A. PULLULANS ABS: NEGATIVE
A.Fumigatus #1 Abs: NEGATIVE
Micropolyspora faeni, IgG: NEGATIVE
Pigeon Serum Abs: NEGATIVE
THERMOACT. SACCHARII: NEGATIVE
THERMOACTINOMYCES VULGARIS IGG: NEGATIVE

## 2017-02-18 NOTE — Progress Notes (Signed)
Radiation Oncology         [phone] ________________________________  Initial Outpatient Consultation  Name: Rebecca [name] MRN: [id number]  Date: 02/18/2017  DOB: 1927/12/01  GU:[id number], Mahima, MD  Javier Glazier, MD   REFERRING PHYSICIAN: Javier Glazier, MD  DIAGNOSIS: Stage IIB (cT1c, cN1, cM0) squamous cell carcinoma presenting in the lingula of the left lung   HISTORY OF PRESENT ILLNESS::Rebecca [name] is a 83 y.o. female who presents with squamous cell carcinoma of the left lung. Patient presented to the ED on 11/13/16 with a fever. Chest x-ray showed progression of interstitial markings reflective of her interstitial lung disease. She presented to Dr. Ashok Cordia on 11/28/16 who ordered a chest CT for ILD. Patient underwent chest CT on 12/04/16. This revealed a cavitary 3.2 cm spiculated lung mass in the peripheral lingula, suspicious for primary bronchogenic carcinoma. PET scan on 01/08/17 showed a persistent 2.9 cm hypermetabolic lingular mass suspicious for primary bronchogenic carcinoma. Mild hypermetabolic left hilar lymphadenopathy highly suspicious for metastatic disease noted. New 11 mm hypermetabolic right upper lobe pulmonary nodule with rapid appearance. Biopsy of the lingular nodule on 01/29/17 showed squamous cell carcinoma.   Patient currently complains of occasional shortness of breath and a productive cough with white sputum. She notes the cough has occurred for the past 8 months and is worse at night. She denies headaches, weight changes, hemoptysis, or pain at this time. Patient's son notes she has a high risk of falling. She presents with a walker but he notes she does not use it all the time. She presents with her son and daughter to discuss the role of radiation as part of her treatment plan.  PREVIOUS RADIATION THERAPY: No  PAST MEDICAL HISTORY:  has a past medical history of Anemia, unspecified (03/18/2011); Anxiety state, unspecified (01/2011); Blood in stool  (06/15/2012); Corns and callosities (07/01/2011); Diverticulosis (02/2011); Dizziness; DJD (degenerative joint disease); Dysuria (06/17/2011); Hemorrhoids (02/2011); Low back pain; Lung cancer ([place]); Mitral valve problem (thickened); Osteoarthritis of both hands (10/09/2015); Osteoporosis (02/2011); Other abnormal blood chemistry (0/30/2012); Other acquired deformity of toe (12/16/2011); Pain in joint, site unspecified (01/2012); 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    PAST SURGICAL HISTORY: Past Surgical History:  Procedure Laterality Date  . bletheroplasty    . CATARACT EXTRACTION W/ INTRAOCULAR LENS  IMPLANT, BILATERAL  2010  . CERVICAL LAMINECTOMY  2005  . CYSTOSCOPY WITH RETROGRADE PYELOGRAM, URETEROSCOPY AND STENT PLACEMENT Bilateral 11/16/2016   Procedure: CYSTOSCOPY WITH RETROGRADE PYELOGRAMS/CLOT EVACUATION;  Surgeon: Irine Seal, MD;  Location: [facility];  Service: Urology;  Laterality: Bilateral;  . HAMMER TOE SURGERY  2013   right 2nd toe Dr. Mallie Mussel  . IVC FILTER PLACEMENT ([facility]X)    . JOINT REPLACEMENT Bilateral 2002 Right, 1992 Left   knees  . TRANSURETHRAL RESECTION OF BLADDER TUMOR N/A 11/16/2016   Procedure: TRANSURETHRAL RESECTION OF BLADDER TUMOR (TURBT);  Surgeon: Irine Seal, MD;  Location: [facility];  Service: Urology;  Laterality: N/A;    FAMILY HISTORY: family history includes Alzheimer's disease in her sister; Emphysema in her father and sister.  SOCIAL HISTORY:  reports that she quit smoking about 28 years ago. She has a 25.00 pack-year smoking history. She has never used smokeless tobacco. She reports that she drinks about 0.6 oz of alcohol per week . She reports that she  does not use drugs.  ALLERGIES: Macrodantin [nitrofurantoin]; Morphine and  related; and Sulfa antibiotics  MEDICATIONS:  Current Outpatient Prescriptions  Medication Sig Dispense Refill  . acetaminophen (TYLENOL) 325 MG tablet Take 650 mg by mouth 3 (three) times daily.     Marland Kitchen acetaminophen (TYLENOL) 325 MG tablet Take 650 mg by mouth 2 (two) times daily.    . Calcium Carbonate-Vitamin D (CALCIUM 600+D) 600-400 MG-UNIT tablet Take 1 tablet by mouth daily.    . Cholecalciferol (VITAMIN D3) 5000 units TABS Take 1 tablet by mouth daily. Take one tablet daily     . clonazePAM (KLONOPIN) 0.5 MG tablet Take 1 tablet (0.5 mg total) by mouth as directed. Take one tablet every morning scheduled and then at bedtime as needed. 15 tablet 0  . donepezil (ARICEPT) 10 MG tablet Take 10 mg by mouth daily.    . DULoxetine (CYMBALTA) 30 MG capsule Take 30 mg by mouth daily.     . fluticasone furoate-vilanterol (BREO ELLIPTA) 100-25 MCG/INH AEPB Inhale 1 puff into the lungs daily.    Marland Kitchen levothyroxine (SYNTHROID, LEVOTHROID) 100 MCG tablet Take 1 tablet (100 mcg total) by mouth daily before breakfast. For thyroid 90 tablet 3  . meloxicam (MOBIC) 7.5 MG tablet Take 7.5 mg by mouth daily as needed for pain.     . memantine (NAMENDA) 10 MG tablet Take 10 mg by mouth 2 (two) times daily.    . Multiple Vitamins-Minerals (THERA M PLUS PO) Take 1 tablet by mouth daily.     . Omega-3 1000 MG CAPS Take 1 capsule by mouth daily. Take one capsule every morning     . omeprazole (PRILOSEC) 20 MG capsule Take 20 mg by mouth at bedtime.    . phenytoin (DILANTIN) 100 MG ER capsule Take 100-200 mg by mouth See admin instructions. Take one capsule every morning; take 2 capsules at bedtime     . QUEtiapine (SEROQUEL) 25 MG tablet Take 12.5 mg by mouth daily. At 1 pm    . trolamine salicylate (ASPERCREME) 10 % cream Apply 1 application topically. Apply as needed    . umeclidinium-vilanterol (ANORO ELLIPTA) 62.5-25 MCG/INH AEPB Inhale 1 puff into the lungs daily. 1 each 6  . amoxicillin (AMOXIL) 500 MG tablet  Take by mouth as needed. Give 4 tablets one hour prior to procedure.    . triamcinolone cream (KENALOG) 0.1 % Apply 1 application topically daily as needed (for rash).      No current facility-administered medications for this encounter.     REVIEW OF SYSTEMS: A 10+ POINT REVIEW OF SYSTEMS WAS OBTAINED including neurology, dermatology, psychiatry, cardiac, respiratory, lymph, extremities, GI, GU, musculoskeletal, constitutional, reproductive, HEENT. All pertinent positives are noted in the HPI. All others are negative.  No reports of hemoptysis or chest pain. No reports of new-onset headaches or visual problems.    PHYSICAL EXAM:  height is 5\' 2"  (1.575 m) and weight is 139 lb 3.2 oz (63.1 kg). Her oral temperature is 98.2 F (36.8 C). Her blood pressure is 101/58 (abnormal) and her pulse is 76. Her oxygen saturation is 94%.    General: Alert and oriented, in no acute distress, patient has some difficulty getting up on the examination table without assistance HEENT: Head is normocephalic. Extraocular movements are intact. Oropharynx is clear. Neck: Neck is supple, no palpable cervical or supraclavicular lymphadenopathy. Heart: Regular in rate and rhythm with no murmurs, rubs, or gallops. Chest: Clear to auscultation bilaterally, with no rhonchi, wheezes, or rales.  Abdomen: Soft, nontender, nondistended, with no rigidity or guarding. Extremities: No cyanosis or edema. Lymphatics: see Neck Exam Skin: No concerning lesions. Musculoskeletal: symmetric strength and muscle tone throughout. Neurologic: Cranial nerves II through XII are grossly intact. No obvious focalities. Speech is fluent. Coordination is intact. Psychiatric: Judgment and insight are intact. Affect is appropriate.    ECOG = 2  0 - Asymptomatic (Fully active, able to carry on all predisease activities without restriction)  1 - Symptomatic but completely ambulatory (Restricted in physically strenuous activity but ambulatory  and able to carry out work of a light or sedentary nature. For example, light housework, office work)  2 - Symptomatic, <50% in bed during the day (Ambulatory and capable of all self care but unable to carry out any work activities. Up and about more than 50% of waking hours)  3 - Symptomatic, >50% in bed, but not bedbound (Capable of only limited self-care, confined to bed or chair 50% or more of waking hours)  4 - Bedbound (Completely disabled. Cannot carry on any self-care. Totally confined to bed or chair)  5 - Death   Eustace Pen MM, Creech RH, Tormey DC, et al. [phone]). "Toxicity and response criteria of the [facility]". [place] Oncol. 5 (6): 649-55  LABORATORY DATA:  Lab Results  Component Value Date   WBC 7.3 01/29/2017   HGB 13.7 01/29/2017   HCT 42.2 01/29/2017   MCV 99.1 01/29/2017   PLT 185 01/29/2017   NEUTROABS 9.5 (H) 11/13/2016   Lab Results  Component Value Date   NA 140 11/20/2016   K 4.3 11/20/2016   CL 103 11/17/2016   CO2 31 11/17/2016   GLUCOSE 95 11/17/2016   CREATININE 0.6 11/20/2016   CALCIUM 8.5 (L) 11/17/2016      RADIOGRAPHY: Dg Chest 2 View  Result Date: 01/19/2017 CLINICAL DATA:  83 year old with known lingular mass suspicious for bronchogenic carcinoma, presenting with acute onset of right-sided chest pain that began today while the patient was exercising. Former long-time smoker. EXAM: CHEST  2 VIEW COMPARISON:  PET-CT 01/08/2017. High-resolution CT chest 12/04/2016. Chest x-rays 11/13/2016, 04/02/2015 and earlier. FINDINGS: AP semi-erect and lateral images were obtained. The mass involving the lingula identified on the July, 2018 CT is not well demonstrated on the chest x-ray, best seen on the lateral image projected over the lower hila. Severe chronic interstitial lung disease associated with low lung volumes is stable dating back to at least 2016. No new pulmonary parenchymal abnormality. No pleural effusions. Cardiac  silhouette mildly enlarged, unchanged. Thoracic aorta atherosclerotic, unchanged. Hilar and mediastinal contours otherwise unremarkable. Degenerative changes throughout the thoracic and upper lumbar spine. Thoracolumbar levoscoliosis. IVC filter. IMPRESSION: 1.  No acute cardiopulmonary disease. 2. Stable chronic interstitial lung disease associated with low lung volumes, likely usual interstitial pneumonitis as described on the prior high-resolution CT chest. 3. The lingular mass identified on the prior CT is difficult to visualize on the chest x-ray but projects over the lower hila on the lateral image. 4. Stable mild cardiomegaly without evidence of pulmonary edema. Electronically Signed   By: Evangeline Dakin M.D.   On: 01/19/2017 12:29   Ct Biopsy  Result Date: 01/29/2017 INDICATION: 83 year old female with a hypermetabolic lingular nodule concerning for primary bronchogenic carcinoma. She presents for CT-guided biopsy to confirm tissue diagnosis. EXAM: CT-guided biopsy left upper lobe pulmonary nodule Interventional Radiologist:  Criselda Peaches, MD MEDICATIONS: None. ANESTHESIA/SEDATION: Fentanyl 50 mcg IV; Versed 0.5 mg IV Moderate Sedation Time:  14 minutes The patient was continuously monitored during the procedure by the interventional radiology nurse under my direct supervision. FLUOROSCOPY TIME:  Fluoroscopy Time: 0 minutes 0 seconds (0 mGy). COMPLICATIONS: None immediate. Estimated blood loss:  0 PROCEDURE: Informed written consent was obtained from the patient after a thorough discussion of the procedural risks, benefits and alternatives. All questions were addressed. Maximal Sterile Barrier Technique was utilized including caps, mask, sterile gowns, sterile gloves, sterile drape, hand hygiene and skin antiseptic. A timeout was performed prior to the initiation of the procedure. A planning axial CT scan was performed. The nodule in the lingula was successfully identified. A suitable skin entry  site was selected and marked. The region was then sterilely prepped and draped in standard fashion using Betadine skin prep. Local anesthesia was attained by infiltration with 1% lidocaine. A small dermatotomy was made. Under intermittent CT fluoroscopic guidance, a 17 gauge trocar needle was advanced into the lung and positioned at the margin of the nodule. Multiple 18 gauge core biopsies were then coaxially obtained using the BioPince automated biopsy device. Biopsy specimens were placed in formalin and delivered to pathology for further analysis. The biopsy device and introducer needle were removed. A BioSentry device was deployed. Post biopsy axial CT imaging demonstrates no evidence of immediate complication. There is no pneumothorax. Mild perilesional alveolar hemorrhage is not unexpected. The patient tolerated the procedure well. IMPRESSION: Technically successful CT-guided biopsy left upper lobe (lingular) pulmonary nodule. Electronically Signed   By: Jacqulynn Cadet M.D.   On: 01/29/2017 17:39   Dg Chest Port 1 View  Result Date: 01/29/2017 CLINICAL DATA:  Lung biopsy. EXAM: PORTABLE CHEST 1 VIEW COMPARISON:  CT 01/29/2017.  Chest x-ray 01/19/2017. FINDINGS: Mediastinum hilar structures are normal. Chronic interstitial changes are present. Left lung mass best identified by prior CT. No pneumothorax post biopsy identified. Stable cardiomegaly. Normal pulmonary vascularity. Degenerative change thoracic spine . IMPRESSION: No pneumothorax post biopsy identified . Electronically Signed   By: Marcello Moores  Register   On: 01/29/2017 15:06      IMPRESSION: Stage IIB (cT1c, cN1, cM0) squamous cell carcinoma presenting in the lingula of the left lung. Patient would not be an operative candidate given her respiratory status and medical situation. She would be a good candidate for a definitive course of radiation therapy along with radiosensitizing chemotherapy (pending Dr. Worthy Flank evaluation).  I discussed  course of treatment side effects and potential long-term toxicities of radiation therapy in this situation with the patient and her family. Patient is agreeable to the treatment but I am unsure in terms of her cognition to proceed with treatment. Patient's son and daughter both agreed to proceed with treatment. Fortunately the patient's primary site and hilar nodal involvement is on a similar plane so limited amount of normal lung would be included in her planned radiation fields. I anticipate 6 weeks of radiation therapy  PLAN: Patient will be seen by Dr. Julien Nordmann on 02/25/17, but this appointment may be changed to October 16 to coincide with simulation today, simulation is scheduled for 1 PM. The patient's son and daughter have conflicks next week that would not allow them to be present for medical oncology consultation.     ------------------------------------------------  Blair Promise, PhD, MD   This document serves as a record of services personally performed by Gery Pray, MD. It was created on his behalf by Bethann Humble, a trained medical scribe. The creation of this record is based on the scribe's personal observations and the provider's  statements to them. This document has been checked and approved by the attending provider.  

## 2017-02-18 NOTE — Progress Notes (Signed)
Please see the Nurse Progress Note in the MD Initial Consult Encounter for this patient. 

## 2017-02-18 NOTE — Telephone Encounter (Signed)
Oncology Nurse Navigator Documentation  Oncology Nurse Navigator Flowsheets 02/18/2017  Navigator Location [facility]  Navigator Encounter Type Telephone/I received a message from scheduling.  Rebecca [name] had concerns about the different appt for his mother. He was told one appt. I called and I spoke with him. I updated him that there would have been a delay in patient getting treatment if she was to come to clinic due to it being full.  I thought earlier treatment would be best. He was thankful for the call and expediting his mothers care.   Telephone Outgoing Call  Treatment Phase Pre-Tx/Tx Discussion  Barriers/Navigation Needs Education  Education Other  Interventions Education  Education Method Verbal  Acuity Level 1  Time Spent with Patient 15

## 2017-02-19 DIAGNOSIS — R2681 Unsteadiness on feet: Secondary | ICD-10-CM | POA: Diagnosis not present

## 2017-02-19 DIAGNOSIS — M545 Low back pain: Secondary | ICD-10-CM | POA: Diagnosis not present

## 2017-02-19 DIAGNOSIS — M6281 Muscle weakness (generalized): Secondary | ICD-10-CM | POA: Diagnosis not present

## 2017-02-19 DIAGNOSIS — R296 Repeated falls: Secondary | ICD-10-CM | POA: Diagnosis not present

## 2017-02-23 DIAGNOSIS — M6281 Muscle weakness (generalized): Secondary | ICD-10-CM | POA: Diagnosis not present

## 2017-02-23 DIAGNOSIS — M545 Low back pain: Secondary | ICD-10-CM | POA: Diagnosis not present

## 2017-02-23 DIAGNOSIS — R296 Repeated falls: Secondary | ICD-10-CM | POA: Diagnosis not present

## 2017-02-23 DIAGNOSIS — R2681 Unsteadiness on feet: Secondary | ICD-10-CM | POA: Diagnosis not present

## 2017-02-25 ENCOUNTER — Other Ambulatory Visit: Payer: Self-pay | Admitting: Medical Oncology

## 2017-02-25 ENCOUNTER — Other Ambulatory Visit (HOSPITAL_BASED_OUTPATIENT_CLINIC_OR_DEPARTMENT_OTHER): Payer: Medicare Other

## 2017-02-25 ENCOUNTER — Ambulatory Visit (HOSPITAL_BASED_OUTPATIENT_CLINIC_OR_DEPARTMENT_OTHER): Payer: Medicare Other | Admitting: Internal Medicine

## 2017-02-25 ENCOUNTER — Encounter: Payer: Self-pay | Admitting: Internal Medicine

## 2017-02-25 VITALS — BP 149/84 | HR 59 | Temp 97.8°F | Resp 18 | Ht 62.0 in | Wt 138.7 lb

## 2017-02-25 DIAGNOSIS — Z87891 Personal history of nicotine dependence: Secondary | ICD-10-CM

## 2017-02-25 DIAGNOSIS — C3412 Malignant neoplasm of upper lobe, left bronchus or lung: Secondary | ICD-10-CM | POA: Diagnosis not present

## 2017-02-25 DIAGNOSIS — F419 Anxiety disorder, unspecified: Secondary | ICD-10-CM

## 2017-02-25 DIAGNOSIS — R05 Cough: Secondary | ICD-10-CM

## 2017-02-25 DIAGNOSIS — C3492 Malignant neoplasm of unspecified part of left bronchus or lung: Secondary | ICD-10-CM

## 2017-02-25 DIAGNOSIS — G8929 Other chronic pain: Secondary | ICD-10-CM | POA: Diagnosis not present

## 2017-02-25 DIAGNOSIS — M545 Low back pain: Secondary | ICD-10-CM

## 2017-02-25 DIAGNOSIS — E559 Vitamin D deficiency, unspecified: Secondary | ICD-10-CM

## 2017-02-25 DIAGNOSIS — M1990 Unspecified osteoarthritis, unspecified site: Secondary | ICD-10-CM

## 2017-02-25 DIAGNOSIS — C771 Secondary and unspecified malignant neoplasm of intrathoracic lymph nodes: Secondary | ICD-10-CM | POA: Diagnosis not present

## 2017-02-25 DIAGNOSIS — Z7189 Other specified counseling: Secondary | ICD-10-CM | POA: Insufficient documentation

## 2017-02-25 DIAGNOSIS — M810 Age-related osteoporosis without current pathological fracture: Secondary | ICD-10-CM | POA: Diagnosis not present

## 2017-02-25 DIAGNOSIS — D649 Anemia, unspecified: Secondary | ICD-10-CM

## 2017-02-25 DIAGNOSIS — Z5111 Encounter for antineoplastic chemotherapy: Secondary | ICD-10-CM

## 2017-02-25 DIAGNOSIS — R911 Solitary pulmonary nodule: Secondary | ICD-10-CM | POA: Diagnosis not present

## 2017-02-25 LAB — CBC WITH DIFFERENTIAL/PLATELET
BASO%: 0.1 % (ref 0.0–2.0)
BASOS ABS: 0 10*3/uL (ref 0.0–0.1)
EOS%: 2.6 % (ref 0.0–7.0)
Eosinophils Absolute: 0.2 10*3/uL (ref 0.0–0.5)
HCT: 41.6 % (ref 34.8–46.6)
HEMOGLOBIN: 13.5 g/dL (ref 11.6–15.9)
LYMPH%: 16.9 % (ref 14.0–49.7)
MCH: 32.6 pg (ref 25.1–34.0)
MCHC: 32.5 g/dL (ref 31.5–36.0)
MCV: 100.5 fL (ref 79.5–101.0)
MONO#: 0.6 10*3/uL (ref 0.1–0.9)
MONO%: 7.9 % (ref 0.0–14.0)
NEUT#: 5.6 10*3/uL (ref 1.5–6.5)
NEUT%: 72.5 % (ref 38.4–76.8)
Platelets: 180 10*3/uL (ref 145–400)
RBC: 4.14 10*6/uL (ref 3.70–5.45)
RDW: 13.3 % (ref 11.2–14.5)
WBC: 7.8 10*3/uL (ref 3.9–10.3)
lymph#: 1.3 10*3/uL (ref 0.9–3.3)

## 2017-02-25 LAB — COMPREHENSIVE METABOLIC PANEL
ALT: 14 U/L (ref 0–55)
AST: 23 U/L (ref 5–34)
Albumin: 3.7 g/dL (ref 3.5–5.0)
Alkaline Phosphatase: 108 U/L (ref 40–150)
Anion Gap: 9 mEq/L (ref 3–11)
BUN: 21.3 mg/dL (ref 7.0–26.0)
CHLORIDE: 104 meq/L (ref 98–109)
CO2: 27 meq/L (ref 22–29)
Calcium: 9.4 mg/dL (ref 8.4–10.4)
Creatinine: 0.9 mg/dL (ref 0.6–1.1)
EGFR: 60 mL/min/{1.73_m2} — AB (ref >60)
GLUCOSE: 87 mg/dL (ref 70–140)
POTASSIUM: 4.6 meq/L (ref 3.5–5.1)
SODIUM: 141 meq/L (ref 136–145)
Total Bilirubin: 0.29 mg/dL (ref 0.20–1.20)
Total Protein: 7.5 g/dL (ref 6.4–8.3)

## 2017-02-25 MED ORDER — PROCHLORPERAZINE MALEATE 10 MG PO TABS: 10.0000 mg | ORAL_TABLET | Freq: Four times a day (QID) | ORAL | 0 refills | Status: DC | PRN

## 2017-02-25 NOTE — Progress Notes (Signed)
[place] Telephone:(336) [phone]   Fax:(336) [phone]  CONSULT NOTE  REFERRING PHYSICIAN: Dr. Tera Partridge  REASON FOR CONSULTATION:  83 years old white female recently diagnosed with lung cancer.  HPI Rebecca [name] is a 83 y.o. female with past medical history significant for anemia, anxiety, osteoarthritis, osteoporosis, seizure, vitamin D deficiency and chronic back pain. The patient was seen by Dr. Ashok Cordia in July 2018 complaining of chronic shortness of breath and cough. He ordered CT of the chest on 12/04/2016 and it showed cavitary 3.2 cm spiculated lung mass in the peripheral lingula suspicious for primary bronchogenic carcinoma. There was also mild AP window lymphadenopathy. A PET scan was performed on 01/08/2017 and it showed 2.9 cm irregular mass seen in the lingula with SUV max of 12.5. There was also mild hypermetabolic adenopathy seen in the left hilum with SUV max of 6.7. There was a new 1.1 cm pulmonary nodules seen in the posterior right upper lobe with SUV max of 3.7 and giving the short time course of appearance compared to one month before this favor an inflammatory or infectious etiology over the past. CT-guided core biopsy of the lingular mass was performed on 02/01/2017 by interventional radiology and the final pathology ([id number]) as consistent with squamous cell carcinoma. The patient was referred to me today for evaluation and recommendation regarding treatment of her condition. She was also seen by Dr. Sondra Come from radiation oncology. When seen today she denied having any specific complaints except for mild cough. She denied having any chest pain, shortness of breath or hemoptysis. She denied having any significant weight loss or night sweats. She has no nausea, vomiting, diarrhea or constipation. She denied having any fever or chills. The patient has no headache or visual changes. She had MRI of the brain performed on 11/16/2016 that was negative  for metastatic disease to the brain. Family history significant for mother with stroke and father with COPD. The patient is a widow and has 2 children. She was accompanied today by her daughter, Rebecca [name]. The patient has a history of smoking 1 pack per day for around 30 years and quit in 1990. She has no history of alcohol or drug abuse.  HPI  Past Medical History:  Diagnosis Date  . Anemia, unspecified 03/18/2011  . Anxiety state, unspecified 01/2011  . Blood in stool 06/15/2012  . Corns and callosities 07/01/2011  . Dementia   . Diverticulosis 02/2011  . Dizziness   . DJD (degenerative joint disease)   . Dysuria 06/17/2011  . Hemorrhoids 02/2011  . Low back pain   . Lung cancer ([place])    left  . Mitral valve problem thickened   thickened  . Osteoarthritis of both hands 10/09/2015  . Osteoporosis 02/2011  . Other abnormal blood chemistry 0/30/2012  . Other acquired deformity of toe 12/16/2011  . Pain in joint, site unspecified 01/2012  . 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 Sacroiliitis, not elsewhere classified ([place]) 05/2011  . Seizures ([place])    Remotely  . Unspecified hypothyroidism 01/2011  . 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 Vitamin D deficiency 01/2011    Past Surgical History:  Procedure Laterality Date  . bletheroplasty    . CATARACT EXTRACTION W/ INTRAOCULAR LENS  IMPLANT, BILATERAL  2010  . CERVICAL LAMINECTOMY  2005  . CYSTOSCOPY WITH RETROGRADE PYELOGRAM, URETEROSCOPY AND STENT PLACEMENT Bilateral 11/16/2016   Procedure: CYSTOSCOPY WITH RETROGRADE PYELOGRAMS/CLOT EVACUATION;  Surgeon:  Irine Seal, MD;  Location: [facility];  Service: Urology;  Laterality: Bilateral;  . HAMMER TOE SURGERY  2013   right 2nd toe Dr. Mallie Mussel  . IVC FILTER PLACEMENT ([facility] HX)    . JOINT REPLACEMENT Bilateral 2002 Right, 1992 Left   knees  . TRANSURETHRAL RESECTION OF BLADDER TUMOR N/A 11/16/2016   Procedure: TRANSURETHRAL RESECTION OF  BLADDER TUMOR (TURBT);  Surgeon: Irine Seal, MD;  Location: [facility];  Service: Urology;  Laterality: N/A;    Family History  Problem Relation Age of Onset  . Alzheimer's disease Sister   . Emphysema Sister   . Emphysema Father   . Rheumatologic disease Neg Hx     Social History Social History  Substance Use Topics  . Smoking status: Former Smoker    Packs/day: 0.50    Years: 50.00    Quit date: 09/14/1988  . Smokeless tobacco: Never Used     Comment: Smoked <1ppd  . Alcohol use 0.6 oz/week    1 Glasses of wine per week     Comment: doesn't drink routinely & only on social occasions    Allergies  Allergen Reactions  . Macrodantin [Nitrofurantoin]   . Morphine And Related Other (See Comments)    Altered mental state   . Sulfa Antibiotics Other (See Comments)    Reaction: unknown    Current Outpatient Prescriptions  Medication Sig Dispense Refill  . acetaminophen (TYLENOL) 325 MG tablet Take 650 mg by mouth 3 (three) times daily.     Marland Kitchen acetaminophen (TYLENOL) 325 MG tablet Take 650 mg by mouth 2 (two) times daily.    Marland Kitchen amoxicillin (AMOXIL) 500 MG tablet Take by mouth as needed. Give 4 tablets one hour prior to procedure.    . Calcium Carbonate-Vitamin D (CALCIUM 600+D) 600-400 MG-UNIT tablet Take 1 tablet by mouth daily.    . Cholecalciferol (VITAMIN D3) 5000 units TABS Take 1 tablet by mouth daily. Take one tablet daily     . clonazePAM (KLONOPIN) 0.5 MG tablet Take 1 tablet (0.5 mg total) by mouth as directed. Take one tablet every morning scheduled and then at bedtime as needed. 15 tablet 0  . donepezil (ARICEPT) 10 MG tablet Take 10 mg by mouth daily.    . DULoxetine (CYMBALTA) 30 MG capsule Take 30 mg by mouth daily.     . fluticasone furoate-vilanterol (BREO ELLIPTA) 100-25 MCG/INH AEPB Inhale 1 puff into the lungs daily.    Marland Kitchen levothyroxine (SYNTHROID, LEVOTHROID) 100 MCG tablet Take 1 tablet (100 mcg total) by mouth daily before breakfast. For thyroid 90 tablet 3  .  meloxicam (MOBIC) 7.5 MG tablet Take 7.5 mg by mouth daily as needed for pain.     . memantine (NAMENDA) 10 MG tablet Take 10 mg by mouth 2 (two) times daily.    . Multiple Vitamins-Minerals (THERA M PLUS PO) Take 1 tablet by mouth daily.     . Omega-3 1000 MG CAPS Take 1 capsule by mouth daily. Take one capsule every morning     . omeprazole (PRILOSEC) 20 MG capsule Take 20 mg by mouth at bedtime.    . phenytoin (DILANTIN) 100 MG ER capsule Take 100-200 mg by mouth See admin instructions. Take one capsule every morning; take 2 capsules at bedtime     . QUEtiapine (SEROQUEL) 25 MG tablet Take 12.5 mg by mouth daily. At 1 pm    . triamcinolone cream (KENALOG) 0.1 % Apply 1 application topically daily as needed (for rash).     Marland Kitchen  trolamine salicylate (ASPERCREME) 10 % cream Apply 1 application topically. Apply as needed    . umeclidinium-vilanterol (ANORO ELLIPTA) 62.5-25 MCG/INH AEPB Inhale 1 puff into the lungs daily. 1 each 6   No current facility-administered medications for this visit.     Review of Systems  Constitutional: positive for fatigue Eyes: negative Ears, nose, mouth, throat, and face: negative Respiratory: positive for cough Cardiovascular: negative Gastrointestinal: negative Genitourinary:negative Integument/breast: negative Hematologic/lymphatic: negative Musculoskeletal:negative Neurological: negative Behavioral/Psych: negative Endocrine: negative Allergic/Immunologic: negative  Physical Exam  MWU:XLKGM, healthy, no distress, well nourished and well developed SKIN: skin color, texture, turgor are normal, no rashes or significant lesions HEAD: Normocephalic, No masses, lesions, tenderness or abnormalities EYES: normal, PERRLA, Conjunctiva are pink and non-injected EARS: External ears normal, Canals clear OROPHARYNX:no exudate, no erythema and lips, buccal mucosa, and tongue normal  NECK: supple, no adenopathy, no JVD LYMPH:  no palpable lymphadenopathy, no  hepatosplenomegaly BREAST:not examined LUNGS: clear to auscultation , and palpation HEART: regular rate & rhythm, no murmurs and no gallops ABDOMEN:abdomen soft, non-tender, normal bowel sounds and no masses or organomegaly BACK: Back symmetric, no curvature., No CVA tenderness EXTREMITIES:no joint deformities, effusion, or inflammation, no edema, no skin discoloration  NEURO: alert & oriented x 3 with fluent speech, no focal motor/sensory deficits  PERFORMANCE STATUS: ECOG 1  LABORATORY DATA: Lab Results  Component Value Date   WBC 7.8 02/25/2017   HGB 13.5 02/25/2017   HCT 41.6 02/25/2017   MCV 100.5 02/25/2017   PLT 180 02/25/2017      Chemistry      Component Value Date/Time   NA 141 02/25/2017 1156   K 4.6 02/25/2017 1156   CL 103 11/17/2016 0454   CO2 27 02/25/2017 1156   BUN 21.3 02/25/2017 1156   CREATININE 0.9 02/25/2017 1156   GLU 105 11/20/2016      Component Value Date/Time   CALCIUM 9.4 02/25/2017 1156   ALKPHOS 108 02/25/2017 1156   AST 23 02/25/2017 1156   ALT 14 02/25/2017 1156   BILITOT 0.29 02/25/2017 1156       RADIOGRAPHIC STUDIES: Ct Biopsy  Result Date: Feb 19, 2017 INDICATION: 83 year old female with a hypermetabolic lingular nodule concerning for primary bronchogenic carcinoma. She presents for CT-guided biopsy to confirm tissue diagnosis. EXAM: CT-guided biopsy left upper lobe pulmonary nodule Interventional Radiologist:  Criselda Peaches, MD MEDICATIONS: None. ANESTHESIA/SEDATION: Fentanyl 50 mcg IV; Versed 0.5 mg IV Moderate Sedation Time:  14 minutes The patient was continuously monitored during the procedure by the interventional radiology nurse under my direct supervision. FLUOROSCOPY TIME:  Fluoroscopy Time: 0 minutes 0 seconds (0 mGy). COMPLICATIONS: None immediate. Estimated blood loss:  0 PROCEDURE: Informed written consent was obtained from the patient after a thorough discussion of the procedural risks, benefits and alternatives. All  questions were addressed. Maximal Sterile Barrier Technique was utilized including caps, mask, sterile gowns, sterile gloves, sterile drape, hand hygiene and skin antiseptic. A timeout was performed prior to the initiation of the procedure. A planning axial CT scan was performed. The nodule in the lingula was successfully identified. A suitable skin entry site was selected and marked. The region was then sterilely prepped and draped in standard fashion using Betadine skin prep. Local anesthesia was attained by infiltration with 1% lidocaine. A small dermatotomy was made. Under intermittent CT fluoroscopic guidance, a 17 gauge trocar needle was advanced into the lung and positioned at the margin of the nodule. Multiple 18 gauge core biopsies were then coaxially obtained using the  BioPince automated biopsy device. Biopsy specimens were placed in formalin and delivered to pathology for further analysis. The biopsy device and introducer needle were removed. A BioSentry device was deployed. Post biopsy axial CT imaging demonstrates no evidence of immediate complication. There is no pneumothorax. Mild perilesional alveolar hemorrhage is not unexpected. The patient tolerated the procedure well. IMPRESSION: Technically successful CT-guided biopsy left upper lobe (lingular) pulmonary nodule. Electronically Signed   By: Jacqulynn Cadet M.D.   On: 01/29/2017 17:39   Dg Chest Port 1 View  Result Date: 01/29/2017 CLINICAL DATA:  Lung biopsy. EXAM: PORTABLE CHEST 1 VIEW COMPARISON:  CT 01/29/2017.  Chest x-ray 01/19/2017. FINDINGS: Mediastinum hilar structures are normal. Chronic interstitial changes are present. Left lung mass best identified by prior CT. No pneumothorax post biopsy identified. Stable cardiomegaly. Normal pulmonary vascularity. Degenerative change thoracic spine . IMPRESSION: No pneumothorax post biopsy identified . Electronically Signed   By: Marcello Moores  Register   On: 01/29/2017 15:06    ASSESSMENT: This  is a very pleasant 83 years old white female diagnosed with stage IIA (T2a, N1, M0) non-small cell lung cancer, squamous cell carcinoma presented with lingual mass as well as left hilar adenopathy diagnosed in September 2018. The patient has good performance status and still very functional.   PLAN: I had a lengthy discussion with the patient and her daughter today about her current disease stage, prognosis and treatment options. The patient is not a great surgical candidate for resection because of her age and other comorbidities. I discussed with the patient other options for treatment of her condition including a course of concurrent chemoradiation with weekly carboplatin for AUC of 2 and paclitaxel 45 MG/M2. I discussed with the patient the adverse effect of this treatment including but not limited to alopecia, myelosuppression, nausea and vomiting, peripheral neuropathy, liver or renal dysfunction. The patient and her daughter would like to proceed with treatment as planned. She is expected to start the first dose of this treatment on 03/09/2017. I will arrange for the patient to have a chemotherapy education class before starting the first dose of her treatment. I will also call her pharmacy with prescription for Compazine 10 mg by mouth every 6 hours as needed for nausea. She would come back for follow-up visit in 3 weeks for reevaluation and management of any adverse effect of her treatment. The patient was advised to call immediately if she has any concerning symptoms in the interval. The patient voices understanding of current disease status and treatment options and is in agreement with the current care plan.  All questions were answered. The patient knows to call the clinic with any problems, questions or concerns. We can certainly see the patient much sooner if necessary.  Thank you so much for allowing me to participate in the care of [place]. I will continue to follow up the  patient with you and assist in her care.  I spent 55 minutes counseling the patient face to face. The total time spent in the appointment was 80 minutes.  Disclaimer: This note was dictated with voice recognition software. Similar sounding words can inadvertently be transcribed and may not be corrected upon review.   Eilleen Kempf February 25, 2017, 1:19 PM

## 2017-02-25 NOTE — Progress Notes (Signed)
START ON PATHWAY REGIMEN - Non-Small Cell Lung     Administer weekly:     Paclitaxel      Carboplatin   **Always confirm dose/schedule in your pharmacy ordering system**    Patient Characteristics: Stage IIA/IIB - Unresectable AJCC T Category: T2a Current Disease Status: No Distant Mets or Local Recurrence AJCC N Category: N1 AJCC M Category: M0 AJCC 8 Stage Grouping: IIB Intent of Therapy: Curative Intent, Discussed with Patient

## 2017-03-02 ENCOUNTER — Telehealth: Payer: Self-pay | Admitting: Internal Medicine

## 2017-03-02 NOTE — Telephone Encounter (Signed)
Patients son is aware of appt date and time. Scheduled appt per 10/10 los.

## 2017-03-03 ENCOUNTER — Encounter: Payer: Self-pay | Admitting: Internal Medicine

## 2017-03-03 ENCOUNTER — Ambulatory Visit
Admission: RE | Admit: 2017-03-03 | Discharge: 2017-03-03 | Disposition: A | Discharge status: Home / Self Care | Payer: Medicare Other | Source: Ambulatory Visit | Attending: Radiation Oncology | Admitting: Radiation Oncology

## 2017-03-03 ENCOUNTER — Other Ambulatory Visit: Payer: Self-pay

## 2017-03-03 ENCOUNTER — Encounter: Payer: Self-pay | Admitting: *Deleted

## 2017-03-03 DIAGNOSIS — C3492 Malignant neoplasm of unspecified part of left bronchus or lung: Secondary | ICD-10-CM

## 2017-03-03 DIAGNOSIS — Z79899 Other long term (current) drug therapy: Secondary | ICD-10-CM | POA: Diagnosis not present

## 2017-03-03 DIAGNOSIS — Z87891 Personal history of nicotine dependence: Secondary | ICD-10-CM | POA: Diagnosis not present

## 2017-03-03 DIAGNOSIS — C3412 Malignant neoplasm of upper lobe, left bronchus or lung: Secondary | ICD-10-CM | POA: Diagnosis not present

## 2017-03-03 DIAGNOSIS — Z881 Allergy status to other antibiotic agents status: Secondary | ICD-10-CM | POA: Diagnosis not present

## 2017-03-03 DIAGNOSIS — Z510 Encounter for antineoplastic radiation therapy: Secondary | ICD-10-CM | POA: Diagnosis not present

## 2017-03-03 DIAGNOSIS — Z9889 Other specified postprocedural states: Secondary | ICD-10-CM | POA: Diagnosis not present

## 2017-03-03 NOTE — Progress Notes (Signed)
  Radiation Oncology         [phone] ________________________________  Name: Rebecca [name] MRN: [id number]  Date: 03/03/2017  DOB: Oct 06, 1927  SIMULATION AND TREATMENT PLANNING NOTE    ICD-10-CM   1. Stage II squamous cell carcinoma of left lung (HCC) C34.92     DIAGNOSIS:   stage IIA (T2a, N1, M0) non-small cell lung cancer, squamous cell carcinoma presented with lingual mass as well as left hilar adenopathy  NARRATIVE:  The patient was brought to the [facility].  Identity was confirmed.  All relevant records and images related to the planned course of therapy were reviewed.  The patient freely provided informed written consent to proceed with treatment after reviewing the details related to the planned course of therapy. The consent form was witnessed and verified by the simulation staff.  Then, the patient was set-up in a stable reproducible  supine position for radiation therapy.  CT images were obtained.  Surface markings were placed.  The CT images were loaded into the planning software.  Then the target and avoidance structures were contoured.  Treatment planning then occurred.  The radiation prescription was entered and confirmed.  Then, I designed and supervised the construction of a total of 5 medically necessary complex treatment devices.  I have requested : 3D Simulation  I have requested a DVH of the following structures: gross target volume, clinical target volume, planning target volume, heart, lungs, spinal cord.  I have ordered:dose calc.  PLAN:  The patient will receive 60 Gy in 30 fractions along with radiosensitizing chemotherapy. -----------------------------------  Blair Promise, PhD, MD

## 2017-03-03 NOTE — Progress Notes (Signed)
Pt has 2 insurances so copay assistance shouldn't be needed so I contacted Jenny Reichmann in the radiation dept requesting she reach out to the pt to inform her of the [facility] to assist w/ gas cards.

## 2017-03-04 DIAGNOSIS — Z79899 Other long term (current) drug therapy: Secondary | ICD-10-CM | POA: Diagnosis not present

## 2017-03-04 DIAGNOSIS — C3492 Malignant neoplasm of unspecified part of left bronchus or lung: Secondary | ICD-10-CM | POA: Diagnosis not present

## 2017-03-04 DIAGNOSIS — Z881 Allergy status to other antibiotic agents status: Secondary | ICD-10-CM | POA: Diagnosis not present

## 2017-03-04 DIAGNOSIS — Z510 Encounter for antineoplastic radiation therapy: Secondary | ICD-10-CM | POA: Diagnosis not present

## 2017-03-04 DIAGNOSIS — Z87891 Personal history of nicotine dependence: Secondary | ICD-10-CM | POA: Diagnosis not present

## 2017-03-04 DIAGNOSIS — C3412 Malignant neoplasm of upper lobe, left bronchus or lung: Secondary | ICD-10-CM | POA: Diagnosis not present

## 2017-03-04 DIAGNOSIS — Z9889 Other specified postprocedural states: Secondary | ICD-10-CM | POA: Diagnosis not present

## 2017-03-05 ENCOUNTER — Telehealth: Payer: Self-pay | Admitting: Pulmonary Disease

## 2017-03-05 DIAGNOSIS — J849 Interstitial pulmonary disease, unspecified: Secondary | ICD-10-CM

## 2017-03-05 NOTE — Telephone Encounter (Signed)
New order has been placed for [facility] for the POC.  Nothing further is needed.

## 2017-03-06 NOTE — Telephone Encounter (Signed)
Staff message was sent from [place] with [employer] to [facility] stating the order placed was not sufficient. Called [employer] and spoke with Melissa and was advised pt indeed as a concentrator through [place] but was unsure what [facility] needed in the new order. Bethanne Ginger will call on Monday 03/09/2017.

## 2017-03-09 ENCOUNTER — Ambulatory Visit: Payer: Self-pay

## 2017-03-09 ENCOUNTER — Telehealth: Payer: Self-pay | Admitting: Pulmonary Disease

## 2017-03-09 NOTE — Telephone Encounter (Signed)
Rebecca [name] [phone]) need a complete oxgen order (has to have concentrator included on the order for the stationary and the portability)

## 2017-03-09 NOTE — Telephone Encounter (Signed)
ATC [facility], no answer. Left message for pt to call back.

## 2017-03-09 NOTE — Telephone Encounter (Signed)
[facility] returning Tamara's call.  # [phone]

## 2017-03-10 ENCOUNTER — Telehealth: Payer: Self-pay | Admitting: Pulmonary Disease

## 2017-03-10 ENCOUNTER — Ambulatory Visit
Admission: RE | Admit: 2017-03-10 | Discharge: 2017-03-10 | Disposition: A | Discharge status: Home / Self Care | Payer: Medicare Other | Source: Ambulatory Visit | Attending: Radiation Oncology | Admitting: Radiation Oncology

## 2017-03-10 ENCOUNTER — Other Ambulatory Visit (HOSPITAL_BASED_OUTPATIENT_CLINIC_OR_DEPARTMENT_OTHER): Payer: Medicare Other

## 2017-03-10 ENCOUNTER — Ambulatory Visit (HOSPITAL_BASED_OUTPATIENT_CLINIC_OR_DEPARTMENT_OTHER): Payer: Medicare Other

## 2017-03-10 ENCOUNTER — Other Ambulatory Visit: Payer: Self-pay | Admitting: Internal Medicine

## 2017-03-10 DIAGNOSIS — Z87891 Personal history of nicotine dependence: Secondary | ICD-10-CM | POA: Diagnosis not present

## 2017-03-10 DIAGNOSIS — C3412 Malignant neoplasm of upper lobe, left bronchus or lung: Secondary | ICD-10-CM

## 2017-03-10 DIAGNOSIS — Z9889 Other specified postprocedural states: Secondary | ICD-10-CM | POA: Diagnosis not present

## 2017-03-10 DIAGNOSIS — Z881 Allergy status to other antibiotic agents status: Secondary | ICD-10-CM | POA: Diagnosis not present

## 2017-03-10 DIAGNOSIS — C3492 Malignant neoplasm of unspecified part of left bronchus or lung: Secondary | ICD-10-CM

## 2017-03-10 DIAGNOSIS — Z510 Encounter for antineoplastic radiation therapy: Secondary | ICD-10-CM | POA: Diagnosis not present

## 2017-03-10 DIAGNOSIS — C771 Secondary and unspecified malignant neoplasm of intrathoracic lymph nodes: Secondary | ICD-10-CM

## 2017-03-10 DIAGNOSIS — Z79899 Other long term (current) drug therapy: Secondary | ICD-10-CM | POA: Diagnosis not present

## 2017-03-10 DIAGNOSIS — Z5111 Encounter for antineoplastic chemotherapy: Secondary | ICD-10-CM | POA: Diagnosis present

## 2017-03-10 LAB — COMPREHENSIVE METABOLIC PANEL
ALT: 13 U/L (ref 0–55)
ANION GAP: 9 meq/L (ref 3–11)
AST: 21 U/L (ref 5–34)
Albumin: 3.4 g/dL — ABNORMAL LOW (ref 3.5–5.0)
Alkaline Phosphatase: 98 U/L (ref 40–150)
BUN: 27.1 mg/dL — ABNORMAL HIGH (ref 7.0–26.0)
CALCIUM: 9 mg/dL (ref 8.4–10.4)
CHLORIDE: 104 meq/L (ref 98–109)
CO2: 25 mEq/L (ref 22–29)
CREATININE: 0.9 mg/dL (ref 0.6–1.1)
Glucose: 86 mg/dl (ref 70–140)
POTASSIUM: 4.3 meq/L (ref 3.5–5.1)
Sodium: 138 mEq/L (ref 136–145)
Total Bilirubin: 0.3 mg/dL (ref 0.20–1.20)
Total Protein: 7.1 g/dL (ref 6.4–8.3)

## 2017-03-10 LAB — CBC WITH DIFFERENTIAL/PLATELET
BASO%: 0.3 % (ref 0.0–2.0)
BASOS ABS: 0 10*3/uL (ref 0.0–0.1)
EOS%: 2.7 % (ref 0.0–7.0)
Eosinophils Absolute: 0.2 10*3/uL (ref 0.0–0.5)
HEMATOCRIT: 40.6 % (ref 34.8–46.6)
HGB: 13.6 g/dL (ref 11.6–15.9)
LYMPH#: 0.8 10*3/uL — AB (ref 0.9–3.3)
LYMPH%: 12.2 % — AB (ref 14.0–49.7)
MCH: 32.6 pg (ref 25.1–34.0)
MCHC: 33.5 g/dL (ref 31.5–36.0)
MCV: 97.1 fL (ref 79.5–101.0)
MONO#: 0.9 10*3/uL (ref 0.1–0.9)
MONO%: 12.7 % (ref 0.0–14.0)
NEUT#: 4.8 10*3/uL (ref 1.5–6.5)
NEUT%: 72.1 % (ref 38.4–76.8)
PLATELETS: 203 10*3/uL (ref 145–400)
RBC: 4.18 10*6/uL (ref 3.70–5.45)
RDW: 13.1 % (ref 11.2–14.5)
WBC: 6.7 10*3/uL (ref 3.9–10.3)

## 2017-03-10 MED ORDER — SODIUM CHLORIDE 0.9 % IV SOLN
Freq: Once | INTRAVENOUS | Status: AC
Start: 2017-03-10 — End: 2017-03-10
  Administered 2017-03-10: 13:00:00 via INTRAVENOUS

## 2017-03-10 MED ORDER — SONAFINE EX EMUL
1.0000 | Freq: Once | CUTANEOUS | Status: AC
Start: 2017-03-10 — End: 2017-03-10
  Administered 2017-03-10: 1 via TOPICAL

## 2017-03-10 MED ORDER — PALONOSETRON HCL INJECTION 0.25 MG/5ML
0.2500 mg | Freq: Once | INTRAVENOUS | Status: AC
Start: 2017-03-10 — End: 2017-03-10
  Administered 2017-03-10: 0.25 mg via INTRAVENOUS

## 2017-03-10 MED ORDER — FAMOTIDINE IN NACL 20-0.9 MG/50ML-% IV SOLN
20.0000 mg | Freq: Once | INTRAVENOUS | Status: AC
  Administered 2017-03-10: 20 mg via INTRAVENOUS

## 2017-03-10 MED ORDER — PALONOSETRON HCL INJECTION 0.25 MG/5ML
INTRAVENOUS | Status: AC
  Filled 2017-03-10: qty 5

## 2017-03-10 MED ORDER — SODIUM CHLORIDE 0.9 % IV SOLN
20.0000 mg | Freq: Once | INTRAVENOUS | Status: AC
  Administered 2017-03-10: 20 mg via INTRAVENOUS
  Filled 2017-03-10: qty 2

## 2017-03-10 MED ORDER — DIPHENHYDRAMINE HCL 50 MG/ML IJ SOLN: 50.0000 mg | Freq: Once | INTRAMUSCULAR | Status: DC

## 2017-03-10 MED ORDER — FAMOTIDINE IN NACL 20-0.9 MG/50ML-% IV SOLN
INTRAVENOUS | Status: AC
  Filled 2017-03-10: qty 50

## 2017-03-10 MED ORDER — SODIUM CHLORIDE 0.9 % IV SOLN
45.0000 mg/m2 | Freq: Once | INTRAVENOUS | Status: AC
  Administered 2017-03-10: 72 mg via INTRAVENOUS
  Filled 2017-03-10: qty 12

## 2017-03-10 MED ORDER — SODIUM CHLORIDE 0.9 % IV SOLN
50.0000 mg | Freq: Once | INTRAVENOUS | Status: AC
  Administered 2017-03-10: 50 mg via INTRAVENOUS
  Filled 2017-03-10: qty 1

## 2017-03-10 MED ORDER — SODIUM CHLORIDE 0.9 % IV SOLN
128.8000 mg | Freq: Once | INTRAVENOUS | Status: AC
  Administered 2017-03-10: 130 mg via INTRAVENOUS
  Filled 2017-03-10: qty 13

## 2017-03-10 NOTE — Patient Instructions (Signed)
[facility] Discharge Instructions for Patients Receiving Chemotherapy  Today you received the following chemotherapy agents : Taxol and Carboplatin.  To help prevent nausea and vomiting after your treatment, we encourage you to take your nausea medication as directed on your prescription.   If you develop nausea and vomiting that is not controlled by your nausea medication, call the clinic.   BELOW ARE SYMPTOMS THAT SHOULD BE REPORTED IMMEDIATELY:  *FEVER GREATER THAN 100.5 F  *CHILLS WITH OR WITHOUT FEVER  NAUSEA AND VOMITING THAT IS NOT CONTROLLED WITH YOUR NAUSEA MEDICATION  *UNUSUAL SHORTNESS OF BREATH  *UNUSUAL BRUISING OR BLEEDING  TENDERNESS IN MOUTH AND THROAT WITH OR WITHOUT PRESENCE OF ULCERS  *URINARY PROBLEMS  *BOWEL PROBLEMS  UNUSUAL RASH Items with * indicate a potential emergency and should be followed up as soon as possible.  Feel free to call the clinic should you have any questions or concerns. The clinic phone number is [phone].  Please show the [facility] at check-in to the Emergency Department and triage nurse.  (Taxol) Paclitaxel injection What is this medicine? PACLITAXEL (PAK li TAX el) is a chemotherapy drug. It targets fast dividing cells, like cancer cells, and causes these cells to die. This medicine is used to treat ovarian cancer, breast cancer, and other cancers. This medicine may be used for other purposes; ask your health care provider or pharmacist if you have questions. COMMON BRAND NAME(S): Onxol, Taxol What should I tell my health care provider before I take this medicine? They need to know if you have any of these conditions: -blood disorders -irregular heartbeat -infection (especially a virus infection such as chickenpox, cold sores, or herpes) -liver disease -previous or ongoing radiation therapy -an unusual or allergic reaction to paclitaxel, alcohol, polyoxyethylated castor oil, other chemotherapy  agents, other medicines, foods, dyes, or preservatives -pregnant or trying to get pregnant -breast-feeding How should I use this medicine? This drug is given as an infusion into a vein. It is administered in a hospital or clinic by a specially trained health care professional. Talk to your pediatrician regarding the use of this medicine in children. Special care may be needed. Overdosage: If you think you have taken too much of this medicine contact a poison control center or emergency room at once. NOTE: This medicine is only for you. Do not share this medicine with others. What if I miss a dose? It is important not to miss your dose. Call your doctor or health care professional if you are unable to keep an appointment. What may interact with this medicine? Do not take this medicine with any of the following medications: -disulfiram -metronidazole This medicine may also interact with the following medications: -cyclosporine -diazepam -ketoconazole -medicines to increase blood counts like filgrastim, pegfilgrastim, sargramostim -other chemotherapy drugs like cisplatin, doxorubicin, epirubicin, etoposide, teniposide, vincristine -quinidine -testosterone -vaccines -verapamil Talk to your doctor or health care professional before taking any of these medicines: -acetaminophen -aspirin -ibuprofen -ketoprofen -naproxen This list may not describe all possible interactions. Give your health care provider a list of all the medicines, herbs, non-prescription drugs, or dietary supplements you use. Also tell them if you smoke, drink alcohol, or use illegal drugs. Some items may interact with your medicine. What should I watch for while using this medicine? Your condition will be monitored carefully while you are receiving this medicine. You will need important blood work done while you are taking this medicine. This medicine can cause serious allergic reactions. To reduce  your risk you will need  to take other medicine(s) before treatment with this medicine. If you experience allergic reactions like skin rash, itching or hives, swelling of the face, lips, or tongue, tell your doctor or health care professional right away. In some cases, you may be given additional medicines to help with side effects. Follow all directions for their use. This drug may make you feel generally unwell. This is not uncommon, as chemotherapy can affect healthy cells as well as cancer cells. Report any side effects. Continue your course of treatment even though you feel ill unless your doctor tells you to stop. Call your doctor or health care professional for advice if you get a fever, chills or sore throat, or other symptoms of a cold or flu. Do not treat yourself. This drug decreases your body's ability to fight infections. Try to avoid being around people who are sick. This medicine may increase your risk to bruise or bleed. Call your doctor or health care professional if you notice any unusual bleeding. Be careful brushing and flossing your teeth or using a toothpick because you may get an infection or bleed more easily. If you have any dental work done, tell your dentist you are receiving this medicine. Avoid taking products that contain aspirin, acetaminophen, ibuprofen, naproxen, or ketoprofen unless instructed by your doctor. These medicines may hide a fever. Do not become pregnant while taking this medicine. Women should inform their doctor if they wish to become pregnant or think they might be pregnant. There is a potential for serious side effects to an unborn child. Talk to your health care professional or pharmacist for more information. Do not breast-feed an infant while taking this medicine. Men are advised not to father a child while receiving this medicine. This product may contain alcohol. Ask your pharmacist or healthcare provider if this medicine contains alcohol. Be sure to tell all healthcare  providers you are taking this medicine. Certain medicines, like metronidazole and disulfiram, can cause an unpleasant reaction when taken with alcohol. The reaction includes flushing, headache, nausea, vomiting, sweating, and increased thirst. The reaction can last from 30 minutes to several hours. What side effects may I notice from receiving this medicine? Side effects that you should report to your doctor or health care professional as soon as possible: -allergic reactions like skin rash, itching or hives, swelling of the face, lips, or tongue -low blood counts - This drug may decrease the number of white blood cells, red blood cells and platelets. You may be at increased risk for infections and bleeding. -signs of infection - fever or chills, cough, sore throat, pain or difficulty passing urine -signs of decreased platelets or bleeding - bruising, pinpoint red spots on the skin, black, tarry stools, nosebleeds -signs of decreased red blood cells - unusually weak or tired, fainting spells, lightheadedness -breathing problems -chest pain -high or low blood pressure -mouth sores -nausea and vomiting -pain, swelling, redness or irritation at the injection site -pain, tingling, numbness in the hands or feet -slow or irregular heartbeat -swelling of the ankle, feet, hands Side effects that usually do not require medical attention (report to your doctor or health care professional if they continue or are bothersome): -bone pain -complete hair loss including hair on your head, underarms, pubic hair, eyebrows, and eyelashes -changes in the color of fingernails -diarrhea -loosening of the fingernails -loss of appetite -muscle or joint pain -red flush to skin -sweating This list may not describe all possible side effects. Call  your doctor for medical advice about side effects. You may report side effects to FDA at 1-800-FDA-1088. Where should I keep my medicine? This drug is given in a hospital  or clinic and will not be stored at home. NOTE: This sheet is a summary. It may not cover all possible information. If you have questions about this medicine, talk to your doctor, pharmacist, or health care provider.  2018 Elsevier/Gold Standard (2015-03-06 19:58:00)  Carboplatin injection What is this medicine? CARBOPLATIN (KAR boe pla tin) is a chemotherapy drug. It targets fast dividing cells, like cancer cells, and causes these cells to die. This medicine is used to treat ovarian cancer and many other cancers. This medicine may be used for other purposes; ask your health care provider or pharmacist if you have questions. COMMON BRAND NAME(S): Paraplatin What should I tell my health care provider before I take this medicine? They need to know if you have any of these conditions: -blood disorders -hearing problems -kidney disease -recent or ongoing radiation therapy -an unusual or allergic reaction to carboplatin, cisplatin, other chemotherapy, other medicines, foods, dyes, or preservatives -pregnant or trying to get pregnant -breast-feeding How should I use this medicine? This drug is usually given as an infusion into a vein. It is administered in a hospital or clinic by a specially trained health care professional. Talk to your pediatrician regarding the use of this medicine in children. Special care may be needed. Overdosage: If you think you have taken too much of this medicine contact a poison control center or emergency room at once. NOTE: This medicine is only for you. Do not share this medicine with others. What if I miss a dose? It is important not to miss a dose. Call your doctor or health care professional if you are unable to keep an appointment. What may interact with this medicine? -medicines for seizures -medicines to increase blood counts like filgrastim, pegfilgrastim, sargramostim -some antibiotics like amikacin, gentamicin, neomycin, streptomycin,  tobramycin -vaccines Talk to your doctor or health care professional before taking any of these medicines: -acetaminophen -aspirin -ibuprofen -ketoprofen -naproxen This list may not describe all possible interactions. Give your health care provider a list of all the medicines, herbs, non-prescription drugs, or dietary supplements you use. Also tell them if you smoke, drink alcohol, or use illegal drugs. Some items may interact with your medicine. What should I watch for while using this medicine? Your condition will be monitored carefully while you are receiving this medicine. You will need important blood work done while you are taking this medicine. This drug may make you feel generally unwell. This is not uncommon, as chemotherapy can affect healthy cells as well as cancer cells. Report any side effects. Continue your course of treatment even though you feel ill unless your doctor tells you to stop. In some cases, you may be given additional medicines to help with side effects. Follow all directions for their use. Call your doctor or health care professional for advice if you get a fever, chills or sore throat, or other symptoms of a cold or flu. Do not treat yourself. This drug decreases your body's ability to fight infections. Try to avoid being around people who are sick. This medicine may increase your risk to bruise or bleed. Call your doctor or health care professional if you notice any unusual bleeding. Be careful brushing and flossing your teeth or using a toothpick because you may get an infection or bleed more easily. If you have any dental  work done, tell your dentist you are receiving this medicine. Avoid taking products that contain aspirin, acetaminophen, ibuprofen, naproxen, or ketoprofen unless instructed by your doctor. These medicines may hide a fever. Do not become pregnant while taking this medicine. Women should inform their doctor if they wish to become pregnant or think  they might be pregnant. There is a potential for serious side effects to an unborn child. Talk to your health care professional or pharmacist for more information. Do not breast-feed an infant while taking this medicine. What side effects may I notice from receiving this medicine? Side effects that you should report to your doctor or health care professional as soon as possible: -allergic reactions like skin rash, itching or hives, swelling of the face, lips, or tongue -signs of infection - fever or chills, cough, sore throat, pain or difficulty passing urine -signs of decreased platelets or bleeding - bruising, pinpoint red spots on the skin, black, tarry stools, nosebleeds -signs of decreased red blood cells - unusually weak or tired, fainting spells, lightheadedness -breathing problems -changes in hearing -changes in vision -chest pain -high blood pressure -low blood counts - This drug may decrease the number of white blood cells, red blood cells and platelets. You may be at increased risk for infections and bleeding. -nausea and vomiting -pain, swelling, redness or irritation at the injection site -pain, tingling, numbness in the hands or feet -problems with balance, talking, walking -trouble passing urine or change in the amount of urine Side effects that usually do not require medical attention (report to your doctor or health care professional if they continue or are bothersome): -hair loss -loss of appetite -metallic taste in the mouth or changes in taste This list may not describe all possible side effects. Call your doctor for medical advice about side effects. You may report side effects to FDA at 1-800-FDA-1088. Where should I keep my medicine? This drug is given in a hospital or clinic and will not be stored at home. NOTE: This sheet is a summary. It may not cover all possible information. If you have questions about this medicine, talk to your doctor, pharmacist, or health care  provider.  2018 Elsevier/Gold Standard (2007-08-10 14:38:05)

## 2017-03-10 NOTE — Addendum Note (Signed)
Addended by: Neysa Hotter on: 03/10/2017 05:58 PM   Modules accepted: Orders

## 2017-03-10 NOTE — Telephone Encounter (Signed)
Advised message from [place].   Spoke with [name] and he would like to speak to [facility].

## 2017-03-10 NOTE — Telephone Encounter (Signed)
Spoke with Rebecca [name], pt has to make an appt because her notes were expired on Saturday.

## 2017-03-10 NOTE — Telephone Encounter (Signed)
Per [name], this has already been addressed. Will close this message.

## 2017-03-10 NOTE — Progress Notes (Signed)
  Radiation Oncology         [phone] ________________________________  Name: Rebecca [name] MRN: [id number]  Date: 03/10/2017  DOB: [birth year]  Simulation Verification Note    ICD-10-CM   1. 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 The clinical setup was verified. Then port films were obtained and uploaded to the radiation oncology medical record software.  The treatment beams were carefully compared against the planned radiation fields. The position location and shape of the radiation fields was reviewed. They targeted volume of tissue appears to be appropriately covered by the radiation beams. Organs at risk appear to be excluded as planned.  Based on my personal review, I approved the simulation verification. The patient's treatment will proceed as planned.  -----------------------------------  Blair Promise, PhD, MD

## 2017-03-10 NOTE — Telephone Encounter (Signed)
Called [facility] and was on hold for over 5 mins. Will try to call back later to get more information.

## 2017-03-10 NOTE — Telephone Encounter (Signed)
She required 2 L/m continuous flow at the time that I last checked her saturation with a walk. Refer to my note.

## 2017-03-10 NOTE — Telephone Encounter (Signed)
Spoke with patient's son [name]. Patient scheduled for cancer treatment tomorrow. He will ask the staff to perform a walk test and the provider at that visit to place an order for home oxygen/portable concentrator. Gave him my contact information to contact me directly if this is an issue.

## 2017-03-10 NOTE — Telephone Encounter (Signed)
Faxed last office note to [facility]. Nothing further is needed. Advised Christine at [facility] to let pt know.

## 2017-03-10 NOTE — Progress Notes (Signed)
Pt here for patient teaching.  Pt given Radiation and You booklet and Sonafine.  Reviewed areas of pertinence such as fatigue, skin changes and throat changes . Pt able to give teach back of to pat skin,apply Sonafine bid and avoid applying anything to skin within 4 hours of treatment. Pt needs reinforcement and no evidence of learning of information given and will contact nursing with any questions or concerns.

## 2017-03-10 NOTE — Telephone Encounter (Signed)
JN, please advise on what oxygen setting the patient needs to be on. Thanks!

## 2017-03-10 NOTE — Progress Notes (Signed)
Patient comes to infusion today accompanied by daughter and son (both are POA). Her baseline is confused. Daughter, Rebecca [name] signed consent.  Prior to taxol infusion, patient is very restless and confused.  1415- BP o2 sat 88% on room air. Patient's son states that due to pulmonary fibrosis, she does sometimes get low and requires intermittent O2. Applied O2 at 2L nasal cannula. O2 at 96%.  1430- Patient's BP has risen since beginning of taxol treatment. Current reading is 134/77 (near her baseline). Dr. Julien Nordmann notified and ok to proceed with increased rate per policy. Will continue to monitor.

## 2017-03-11 ENCOUNTER — Non-Acute Institutional Stay: Payer: Medicare Other | Admitting: Family

## 2017-03-11 ENCOUNTER — Ambulatory Visit
Admission: RE | Admit: 2017-03-11 | Discharge: 2017-03-11 | Disposition: A | Discharge status: Home / Self Care | Payer: Medicare Other | Source: Ambulatory Visit | Attending: Radiation Oncology | Admitting: Radiation Oncology

## 2017-03-11 DIAGNOSIS — R0902 Hypoxemia: Secondary | ICD-10-CM

## 2017-03-11 DIAGNOSIS — C3492 Malignant neoplasm of unspecified part of left bronchus or lung: Secondary | ICD-10-CM | POA: Diagnosis not present

## 2017-03-11 DIAGNOSIS — Z881 Allergy status to other antibiotic agents status: Secondary | ICD-10-CM | POA: Diagnosis not present

## 2017-03-11 DIAGNOSIS — Z9889 Other specified postprocedural states: Secondary | ICD-10-CM | POA: Diagnosis not present

## 2017-03-11 DIAGNOSIS — Z87891 Personal history of nicotine dependence: Secondary | ICD-10-CM | POA: Diagnosis not present

## 2017-03-11 DIAGNOSIS — Z79899 Other long term (current) drug therapy: Secondary | ICD-10-CM | POA: Diagnosis not present

## 2017-03-11 DIAGNOSIS — Z510 Encounter for antineoplastic radiation therapy: Secondary | ICD-10-CM | POA: Diagnosis not present

## 2017-03-11 DIAGNOSIS — C3412 Malignant neoplasm of upper lobe, left bronchus or lung: Secondary | ICD-10-CM | POA: Diagnosis not present

## 2017-03-11 NOTE — Progress Notes (Signed)
Location:  [facility] Room Number: 36 Place of Service:  ALF [phone]) Provider: Gayathri Futrell FNP-C  [name], MD  Patient Care Team: [name], MD as PCP - General (Internal Medicine) [name], [facility] [name], MD as Consulting Physician (Orthopedic Surgery) [name], MD as Consulting Physician (Physical Medicine and Rehabilitation) Melina Schools, MD as Consulting Physician (Orthopedic Surgery) [name], MD as Consulting Physician (Otolaryngology) Liron Eissler, [name], NP as Nurse Practitioner (Family Medicine)  Extended Emergency Contact Information Primary Emergency Contact: [name] Address: [address]          [name] of [place] Phone: [phone] Relation: Son Secondary Emergency Contact: [name] [place] of [place] Phone: [phone] Mobile Phone: [phone] Relation: Daughter  Code Status:  DNR Goals of care: Advanced Directive information Advanced Directives 03/11/2017  Does Patient Have a Medical Advance Directive? Yes  Type of Paramedic of [place];Living will;Out of facility DNR (pink MOST or yellow form)  Does patient want to make changes to medical advance directive? -  Copy of [place] in Chart? Yes  Pre-existing out of facility DNR order (yellow form or pink MOST form) Yellow form placed in chart (order not valid for inpatient use)     Chief Complaint  Patient presents with  . Acute Visit    hyproxia    HPI:  Pt is a 83 y.o. female seen today at [facility] for an acute visit for evaluation of hypoxia. She has a significant medical history of Squamous cell carcinoma following up Dr. Jan Fireman with [facility] and Dr. [facility].she is see in her room today with daughter at bedside.Patient's daughter states patient had chemotherapy previous day and has appointment today for  Radiation.Facility Nurse nurse patient's oxygen saturation dropped to 75 % on room air.Patient states was walking in the room without oxygen then felt dizzy.Oxygen via nasal cannula applied with much improvement.she denies any fever, chills or any symptoms of urinary tract infections.Patient's daughter verbalizes no new concerns.Face to face assessment completed to evaluate for need for continuous oxygen.Her oxygen saturation at rest on room air was 84%; oxygen saturation at room Air walking was 77% and  oxygen saturation at room Air walking on oxygen 2 Liters via  Nasal cannula was 91 %. She will require continuous oxygen concentrator and portable gaseous at 2 liters via nasal cannula. Patient to wear portable oxygen for chemotherapy and radiology treatment appointment.    Past Medical History:  Diagnosis Date  . Anemia, unspecified 03/18/2011  . Anxiety state, unspecified 01/2011  . Blood in stool 06/15/2012  . Corns and callosities 07/01/2011  . Dementia   . Diverticulosis 02/2011  . Dizziness   . DJD (degenerative joint disease)   . Dysuria 06/17/2011  . Hemorrhoids 02/2011  . Low back pain   . Lung cancer ([place])    left  . Mitral valve problem thickened   thickened  . Osteoarthritis of both hands 10/09/2015  . Osteoporosis 02/2011  . Other abnormal blood chemistry 0/30/2012  . Other acquired deformity of toe 12/16/2011  . Pain in joint, site unspecified 01/2012  . 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 Sacroiliitis, not elsewhere classified ([place]) 05/2011  . Seizures ([place])    Remotely  . Unspecified hypothyroidism 01/2011  . 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 Vitamin D deficiency 01/2011   Past Surgical History:  Procedure  Laterality Date  . bletheroplasty    . CATARACT EXTRACTION W/ INTRAOCULAR LENS  IMPLANT, BILATERAL  2010  . CERVICAL LAMINECTOMY  2005  . CYSTOSCOPY WITH RETROGRADE PYELOGRAM, URETEROSCOPY AND STENT PLACEMENT  Bilateral 11/16/2016   Procedure: CYSTOSCOPY WITH RETROGRADE PYELOGRAMS/CLOT EVACUATION;  Surgeon: Irine Seal, MD;  Location: [facility];  Service: Urology;  Laterality: Bilateral;  . HAMMER TOE SURGERY  2013   right 2nd toe Dr. Mallie Mussel  . IVC FILTER PLACEMENT ([facility] HX)    . JOINT REPLACEMENT Bilateral 2002 Right, 1992 Left   knees  . TRANSURETHRAL RESECTION OF BLADDER TUMOR N/A 11/16/2016   Procedure: TRANSURETHRAL RESECTION OF BLADDER TUMOR (TURBT);  Surgeon: Irine Seal, MD;  Location: [facility];  Service: Urology;  Laterality: N/A;    Allergies  Allergen Reactions  . Macrodantin [Nitrofurantoin]   . Morphine And Related Other (See Comments)    Altered mental state   . Sulfa Antibiotics Other (See Comments)    Reaction: unknown    Outpatient Encounter Prescriptions as of 03/11/2017  Medication Sig  . acetaminophen (TYLENOL) 325 MG tablet Take 650 mg by mouth 2 (two) times daily.  Marland Kitchen amoxicillin (AMOXIL) 500 MG tablet Take by mouth as needed. Give 4 tablets one hour prior to procedure.  . Calcium Carbonate-Vitamin D (CALCIUM 600+D) 600-400 MG-UNIT tablet Take 1 tablet by mouth daily.  . Cholecalciferol (VITAMIN D3) 5000 units TABS Take 1 tablet by mouth daily. Take one tablet daily   . clonazePAM (KLONOPIN) 0.5 MG tablet Take 1 tablet (0.5 mg total) by mouth as directed. Take one tablet every morning scheduled and then at bedtime as needed.  . donepezil (ARICEPT) 10 MG tablet Take 10 mg by mouth. Take one tablet in evening for memory  . DULoxetine (CYMBALTA) 30 MG capsule Take 30 mg by mouth daily.   Marland Kitchen levothyroxine (SYNTHROID, LEVOTHROID) 100 MCG tablet Take 1 tablet (100 mcg total) by mouth daily before breakfast. For thyroid  . meloxicam (MOBIC) 7.5 MG tablet Take 7.5 mg by mouth daily as needed for pain.   . memantine (NAMENDA) 10 MG tablet Take 10 mg by mouth 2 (two) times daily.  . Multiple Vitamins-Minerals (THERA M PLUS PO) Take 1 tablet by mouth daily.   . Omega-3 1000 MG CAPS Take 1  capsule by mouth daily. Take one capsule every morning   . omeprazole (PRILOSEC) 20 MG capsule Take 20 mg by mouth at bedtime.  . phenytoin (DILANTIN) 100 MG ER capsule Take 100-200 mg by mouth See admin instructions. Take one capsule every morning; take 2 capsules at bedtime   . prochlorperazine (COMPAZINE) 10 MG tablet Take 1 tablet (10 mg total) by mouth every 6 (six) hours as needed for nausea or vomiting.  Marland Kitchen QUEtiapine (SEROQUEL) 25 MG tablet Take 12.5 mg by mouth daily. At 8 pm  . triamcinolone cream (KENALOG) 0.1 % Apply 1 application topically daily as needed (for rash).   . trolamine salicylate (ASPERCREME) 10 % cream Apply 1 application topically. Apply as needed  . umeclidinium-vilanterol (ANORO ELLIPTA) 62.5-25 MCG/INH AEPB Inhale 1 puff into the lungs daily.  . [DISCONTINUED] acetaminophen (TYLENOL) 325 MG tablet Take 650 mg by mouth 3 (three) times daily.   . [DISCONTINUED] fluticasone furoate-vilanterol (BREO ELLIPTA) 100-25 MCG/INH AEPB Inhale 1 puff into the lungs daily.  . [DISCONTINUED] Wound Dressings (SONAFINE EX) Apply 1 application topically 2 (two) times daily.   No facility-administered encounter medications on file as of 03/11/2017.     Review of Systems  Constitutional: Negative for activity change, appetite change, chills, fatigue and fever.  HENT: Negative for congestion, rhinorrhea, sinus pain, sinus pressure, sneezing, sore throat and trouble swallowing.   Respiratory: Negative for chest tightness and wheezing.        Occasional cough with white phlegm.  Cardiovascular: Negative for chest pain, palpitations and leg swelling.  Gastrointestinal: Negative for abdominal distention, abdominal pain, constipation, diarrhea, nausea and vomiting.  Genitourinary: Negative for dysuria, flank pain, frequency and urgency.  Musculoskeletal: Positive for gait problem.       Back pain in the morning which improves as the day progresses   Skin: Negative for color change,  pallor, rash and wound.  Neurological: Negative for seizures, syncope, light-headedness, numbness and headaches.  Hematological: Does not bruise/bleed easily.  Psychiatric/Behavioral: Positive for confusion. Negative for agitation, hallucinations and sleep disturbance. The patient is not nervous/anxious.     Immunization History  Administered Date(s) Administered  . Influenza Split 02/18/2017  . Influenza Whole 03/17/2007, 02/16/2009, 01/30/2010, 02/16/2013  . Influenza-Unspecified 03/02/2014, 02/22/2015, 02/28/2016  . PPD Test 07/09/2010, 02/04/2015, 04/04/2015, 06/04/2015  . Pneumococcal Conjugate-13 01/13/2017  . Pneumococcal Polysaccharide-23 05/19/2005, 05/23/2013  . Td 05/19/2005  . Tetanus 10/06/2012   Pertinent  Health Maintenance Due  Topic Date Due  . DEXA SCAN  05/19/2017 (Originally 11/19/1994)  . INFLUENZA VACCINE  Completed  . PNA vac Low Risk Adult  Completed   Fall Risk  02/18/2017 01/05/2017 10/09/2015 07/10/2015 03/26/2015  Falls in the past year? Yes No No Yes Yes  Number falls in past yr: 2 or more - - 1 1  Injury with Fall? No - - Yes No  Comment - - - scape on her left leg -  Risk Factor Category  High Fall Risk - - - -    Vitals:   03/11/17 1032  BP: 122/78  Pulse: 72  Resp: 20  Temp: 98.5 F (36.9 C)  SpO2: 90%  Weight: 138 lb 9.6 oz (62.9 kg)  Height: 5\' 2"  (1.575 m)   Body mass index is 25.35 kg/m. Physical Exam  Constitutional:  Frail elderly in no acute distress   HENT:  Head: Normocephalic.  Right Ear: External ear normal.  Left Ear: External ear normal.  Mouth/Throat: Oropharynx is clear and moist. No oropharyngeal exudate.  Eyes: Pupils are equal, round, and reactive to light. Conjunctivae and EOM are normal. Right eye exhibits no discharge. Left eye exhibits no discharge. No scleral icterus.  Neck: Normal range of motion. No thyromegaly present.  Cardiovascular: Intact distal pulses.  Exam reveals no gallop and no friction rub.   Murmur  heard. Pulmonary/Chest: Effort normal. No respiratory distress. She has no wheezes. She has no rales.  Diminished bases  Abdominal: Soft. Bowel sounds are normal. She exhibits no distension. There is no tenderness. There is no rebound and no guarding.  Musculoskeletal: She exhibits no edema or tenderness.  Unsteady gait uses FWW  Lymphadenopathy:    She has no cervical adenopathy.  Neurological:  Pleasantly confused at there baseline   Skin: Skin is warm and dry. No rash noted. No erythema. No pallor.  Psychiatric: She has a normal mood and affect.   Labs reviewed:  Recent Labs  11/13/16 1917 11/15/16 0347 11/17/16 0454 11/20/16 02/25/17 1156 03/10/17 1107  NA 136 142 140 140 141 138  K 4.0 3.5 3.6 4.3 4.6 4.3  CL 106 111 103  --   --   --   CO2 25 26 31   --  27  25  GLUCOSE 123* 93 95  --  87 86  BUN 18 9 10 17  21.3 27.1*  CREATININE 0.75 0.60 0.53 0.6 0.9 0.9  CALCIUM 8.2* 8.4* 8.5*  --  9.4 9.0    Recent Labs  11/15/16 0347 02/25/17 1156 03/10/17 1107  AST 20 23 21   ALT 17 14 13   ALKPHOS 54 108 98  BILITOT 0.7 0.29 0.30  PROT 5.4* 7.5 7.1  ALBUMIN 2.7* 3.7 3.4*    Recent Labs  11/13/16 1917  01/29/17 0944 02/25/17 1155 03/10/17 1107  WBC 11.3*  < > 7.3 7.8 6.7  NEUTROABS 9.5*  --   --  5.6 4.8  HGB 12.7  < > 13.7 13.5 13.6  HCT 39.0  < > 42.2 41.6 40.6  MCV 97.7  < > 99.1 100.5 97.1  PLT 136*  < > 185 180 203  < > = values in this interval not displayed. Lab Results  Component Value Date   TSH 0.533 11/13/2016   Lab Results  Component Value Date   HGBA1C 5.4 03/08/2015   Lab Results  Component Value Date   CHOL 235 (H) 03/06/2009   HDL 108.40 03/06/2009   LDLDIRECT 108.2 03/06/2009    Significant Diagnostic Results in last 30 days:  No results found.  Assessment/Plan   Hypoxia Had dizziness with oxygen saturations in the 70's on room air prior to visit but symptoms resolved with continuous oxygen via nasal cannula.oxygen saturation at  rest on room air was 84%; oxygen saturation at room Air walking was 77% and  oxygen saturation at room Air walking on oxygen 2 Liters via  Nasal cannula was 91 %. Face to face assessment completed for continuous oxygen concentrator and portable gaseous at 2 liters via nasal cannula. Patient to wear portable oxygen for chemotherapy and radiology treatment appointment.DME form filled to be faxed by facility Nurse to [place] [facility].continue to monitor and encourage patient to wear oxygen.     Family/ staff Communication: Reviewed plan of care with patient and facility Nurse supervisor  Labs/tests ordered: None   Sandrea Hughs, NP

## 2017-03-12 ENCOUNTER — Other Ambulatory Visit: Payer: Self-pay | Admitting: Medical Oncology

## 2017-03-12 ENCOUNTER — Telehealth: Payer: Self-pay | Admitting: Medical Oncology

## 2017-03-12 ENCOUNTER — Ambulatory Visit
Admission: RE | Admit: 2017-03-12 | Discharge: 2017-03-12 | Disposition: A | Discharge status: Home / Self Care | Payer: Medicare Other | Source: Ambulatory Visit | Attending: Radiation Oncology | Admitting: Radiation Oncology

## 2017-03-12 DIAGNOSIS — Z79899 Other long term (current) drug therapy: Secondary | ICD-10-CM | POA: Diagnosis not present

## 2017-03-12 DIAGNOSIS — C3492 Malignant neoplasm of unspecified part of left bronchus or lung: Secondary | ICD-10-CM | POA: Diagnosis not present

## 2017-03-12 DIAGNOSIS — C3412 Malignant neoplasm of upper lobe, left bronchus or lung: Secondary | ICD-10-CM | POA: Diagnosis not present

## 2017-03-12 DIAGNOSIS — Z87891 Personal history of nicotine dependence: Secondary | ICD-10-CM | POA: Diagnosis not present

## 2017-03-12 DIAGNOSIS — Z9889 Other specified postprocedural states: Secondary | ICD-10-CM | POA: Diagnosis not present

## 2017-03-12 DIAGNOSIS — Z510 Encounter for antineoplastic radiation therapy: Secondary | ICD-10-CM | POA: Diagnosis not present

## 2017-03-12 DIAGNOSIS — Z881 Allergy status to other antibiotic agents status: Secondary | ICD-10-CM | POA: Diagnosis not present

## 2017-03-12 NOTE — Telephone Encounter (Signed)
Spoke to pt and son in lobby . Son is concerned because his mother is not eating much and very tired. She just finished her xrt treatment.Her first chemo was 10/23 taxol [place]. She is on oxygen which is new. Her oxygen sat on 2 liters at rest were 95-97% . Pt ate wheat toast and margarine and jelly today and this seems to be her favorite breakfast. She drank coffee. Her mucous membranes are moist. She denied nausea/vomiting and diarrhea.  I told son and pt to increase foods high  in calories /protein like  Milkshakes, add peanut butter to toast, puddings, jello, and non caffeine beverages. Again she said "I am too fat , I need to lose weight." I told her this is not time to lose weight while getting radiation and chemo. I told her son that  I will refer pt to dietician . She will be back tomorrow for xrt. She ambulated with a walker with son. Marland KitchenReferral made.

## 2017-03-12 NOTE — Telephone Encounter (Signed)
F/u re chemo -pt son answered phone and said she is not doing well and can he talk to me when she gets here for radiation. I told him yes.

## 2017-03-13 ENCOUNTER — Encounter: Payer: Self-pay | Admitting: Family

## 2017-03-13 ENCOUNTER — Other Ambulatory Visit: Payer: Self-pay | Admitting: *Deleted

## 2017-03-13 ENCOUNTER — Ambulatory Visit (HOSPITAL_BASED_OUTPATIENT_CLINIC_OR_DEPARTMENT_OTHER): Payer: Medicare Other | Admitting: Medical

## 2017-03-13 ENCOUNTER — Non-Acute Institutional Stay: Payer: Medicare Other | Admitting: Family

## 2017-03-13 ENCOUNTER — Ambulatory Visit
Admission: RE | Admit: 2017-03-13 | Discharge: 2017-03-13 | Disposition: A | Discharge status: Home / Self Care | Payer: Medicare Other | Source: Ambulatory Visit | Attending: Radiation Oncology | Admitting: Radiation Oncology

## 2017-03-13 ENCOUNTER — Telehealth: Payer: Self-pay | Admitting: *Deleted

## 2017-03-13 ENCOUNTER — Telehealth: Payer: Self-pay | Admitting: Oncology

## 2017-03-13 ENCOUNTER — Ambulatory Visit (HOSPITAL_BASED_OUTPATIENT_CLINIC_OR_DEPARTMENT_OTHER): Payer: Medicare Other

## 2017-03-13 VITALS — BP 109/56 | HR 70 | Temp 97.6°F | Resp 16 | Ht 62.0 in | Wt 141.1 lb

## 2017-03-13 DIAGNOSIS — C3492 Malignant neoplasm of unspecified part of left bronchus or lung: Secondary | ICD-10-CM | POA: Diagnosis not present

## 2017-03-13 DIAGNOSIS — R509 Fever, unspecified: Secondary | ICD-10-CM

## 2017-03-13 DIAGNOSIS — C3412 Malignant neoplasm of upper lobe, left bronchus or lung: Secondary | ICD-10-CM

## 2017-03-13 DIAGNOSIS — Z881 Allergy status to other antibiotic agents status: Secondary | ICD-10-CM | POA: Diagnosis not present

## 2017-03-13 DIAGNOSIS — Z510 Encounter for antineoplastic radiation therapy: Secondary | ICD-10-CM | POA: Diagnosis not present

## 2017-03-13 DIAGNOSIS — E1165 Type 2 diabetes mellitus with hyperglycemia: Secondary | ICD-10-CM | POA: Diagnosis not present

## 2017-03-13 DIAGNOSIS — R0609 Other forms of dyspnea: Secondary | ICD-10-CM | POA: Diagnosis not present

## 2017-03-13 DIAGNOSIS — G9341 Metabolic encephalopathy: Secondary | ICD-10-CM | POA: Diagnosis not present

## 2017-03-13 DIAGNOSIS — Z9889 Other specified postprocedural states: Secondary | ICD-10-CM | POA: Diagnosis not present

## 2017-03-13 DIAGNOSIS — Z79899 Other long term (current) drug therapy: Secondary | ICD-10-CM | POA: Diagnosis not present

## 2017-03-13 DIAGNOSIS — Z9981 Dependence on supplemental oxygen: Secondary | ICD-10-CM | POA: Diagnosis not present

## 2017-03-13 DIAGNOSIS — R05 Cough: Secondary | ICD-10-CM

## 2017-03-13 DIAGNOSIS — C771 Secondary and unspecified malignant neoplasm of intrathoracic lymph nodes: Secondary | ICD-10-CM | POA: Diagnosis not present

## 2017-03-13 DIAGNOSIS — F0390 Unspecified dementia without behavioral disturbance: Secondary | ICD-10-CM | POA: Diagnosis not present

## 2017-03-13 DIAGNOSIS — I82412 Acute embolism and thrombosis of left femoral vein: Secondary | ICD-10-CM | POA: Diagnosis not present

## 2017-03-13 DIAGNOSIS — Z87891 Personal history of nicotine dependence: Secondary | ICD-10-CM | POA: Diagnosis not present

## 2017-03-13 LAB — CBC WITH DIFFERENTIAL/PLATELET
BASO%: 0.3 % (ref 0.0–2.0)
Basophils Absolute: 0 10*3/uL (ref 0.0–0.1)
EOS ABS: 0.1 10*3/uL (ref 0.0–0.5)
EOS%: 1.7 % (ref 0.0–7.0)
HEMATOCRIT: 39.2 % (ref 34.8–46.6)
HEMOGLOBIN: 13 g/dL (ref 11.6–15.9)
LYMPH%: 13.7 % — AB (ref 14.0–49.7)
MCH: 32.5 pg (ref 25.1–34.0)
MCHC: 33 g/dL (ref 31.5–36.0)
MCV: 98.5 fL (ref 79.5–101.0)
MONO#: 0.6 10*3/uL (ref 0.1–0.9)
MONO%: 12.2 % (ref 0.0–14.0)
NEUT%: 72.1 % (ref 38.4–76.8)
NEUTROS ABS: 3.4 10*3/uL (ref 1.5–6.5)
Platelets: 158 10*3/uL (ref 145–400)
RBC: 3.98 10*6/uL (ref 3.70–5.45)
RDW: 13.4 % (ref 11.2–14.5)
WBC: 4.7 10*3/uL (ref 3.9–10.3)
lymph#: 0.6 10*3/uL — ABNORMAL LOW (ref 0.9–3.3)

## 2017-03-13 LAB — COMPREHENSIVE METABOLIC PANEL
ALT: 17 U/L (ref 0–55)
ANION GAP: 8 meq/L (ref 3–11)
AST: 24 U/L (ref 5–34)
Albumin: 3.2 g/dL — ABNORMAL LOW (ref 3.5–5.0)
Alkaline Phosphatase: 86 U/L (ref 40–150)
BUN: 34.9 mg/dL — ABNORMAL HIGH (ref 7.0–26.0)
CHLORIDE: 104 meq/L (ref 98–109)
CO2: 26 meq/L (ref 22–29)
CREATININE: 0.9 mg/dL (ref 0.6–1.1)
Calcium: 9 mg/dL (ref 8.4–10.4)
EGFR: 56 mL/min/{1.73_m2} — ABNORMAL LOW (ref >60)
Glucose: 120 mg/dl (ref 70–140)
Potassium: 4.4 mEq/L (ref 3.5–5.1)
SODIUM: 138 meq/L (ref 136–145)
Total Bilirubin: 0.23 mg/dL (ref 0.20–1.20)
Total Protein: 6.8 g/dL (ref 6.4–8.3)

## 2017-03-13 MED ORDER — AMOXICILLIN-POT CLAVULANATE 875-125 MG PO TABS: 1.0000 | ORAL_TABLET | Freq: Two times a day (BID) | ORAL | 0 refills | Status: AC

## 2017-03-13 NOTE — Telephone Encounter (Signed)
Called Symptom Management and spoke to [employer], PA-C.  Advised him that patient had first chemo on Tuesday, had a temp of 101.1 this morning and was given Tylenol at [facility].  Her temp is 97.8 now.  Also her BP is low at 84/50, although her son said her BP usually runs low.  Dr. Lisbeth Renshaw advised and requested that patient be seen in the symptom Management clinic today.  Per Lucianne Lei, appointment will be scheduled.  Patient's son notified and escorted patient to the lobby to check in for [facility] appointment.

## 2017-03-13 NOTE — Progress Notes (Signed)
Fax'd signed order for augmentin to [facility] @1715  per request of [place], Therapist, sports

## 2017-03-13 NOTE — Progress Notes (Signed)
[facility] Progress Note   Rebecca [name] [id number] 09-02-1927 83 y.o.  Rebecca [name] is managed by Dr. Eilleen Kempf  Actively treated with chemotherapy: yes  Current Therapy: Carboplatin and paclitaxel with concurrent radiation  Last Treated: 10 / 23 / 2018  Assessment: Plan:    Fever, unspecified fever cause  Squamous cell carcinoma of left lung (HCC)   Fever: The patient was given a prescription for Augmentin 875-125 by mouth twice a day 7 days.  Squamous cell carcinoma the left lung: Patient is status post radiation therapy today and status post carboplatin and paclitaxel dosed on 03/10/2017.  Please see After Visit Summary for patient specific instructions.  Future Appointments Date Time Provider [place]  03/16/2017 9:35 AM [facility] LINAC 3 [facility] None  03/16/2017 10:00 AM [facility] LAB 1 [facility] None  03/16/2017 10:30 AM Curt Bears, MD [facility] None  03/16/2017 11:30 AM [facility] C10 [facility] None  03/16/2017 12:45 PM Neff, Valda Lamb, RD [facility] None  03/17/2017 9:30 AM [facility] LINAC 3 [facility] None  03/18/2017 9:30 AM [facility] LINAC 3 [facility] None  03/19/2017 9:30 AM [facility] LINAC 3 [facility] None  03/20/2017 9:30 AM [facility] LINAC 3 [facility] None  03/23/2017 10:15 AM [facility] LINAC 3 [facility] None  03/23/2017 12:30 PM [facility] LAB 6 [facility] None  03/23/2017 1:30 PM [facility] PROCEDURE 1 [facility] None  03/24/2017 10:15 AM [facility] LINAC 3 [facility] None  03/25/2017 9:55 AM [facility] LINAC 3 [facility] None  03/26/2017 9:55 AM [facility] LINAC 3 [facility] None  03/27/2017 9:55 AM [facility] LINAC 3 [facility] None  03/30/2017 9:30 AM [facility] LAB 1 [facility] None  03/30/2017 10:00 AM Curcio, Kristin R, NP [facility] None  03/30/2017 11:00 AM [facility] I27 DNS [facility] None  03/30/2017 2:25 PM [facility] LINAC 3 [facility] None    03/31/2017 10:00 AM [facility] LINAC 3 [facility] None  04/01/2017 9:55 AM [facility] LINAC 3 [facility] None  04/02/2017 10:00 AM [facility] LINAC 3 [facility] None  04/03/2017 10:00 AM [facility] LINAC 3 [facility] None  04/06/2017 9:45 AM [facility] LINAC 3 [facility] None  04/06/2017 10:00 AM [facility] LAB 3 [facility] None  04/06/2017 11:00 AM [facility] H30 [facility] None  04/07/2017 10:00 AM [facility] LINAC 3 [facility] None  04/08/2017 10:00 AM [facility] LINAC 3 [facility] None  04/13/2017 10:00 AM [facility] LINAC 3 [facility] None  04/13/2017 10:30 AM [facility] LAB 1 [facility] None  04/13/2017 11:00 AM Curt Bears, MD [facility] None  04/13/2017 12:00 PM [facility] I25 DNS [facility] None  04/14/2017 10:00 AM [facility] LINAC 3 [facility] None  04/15/2017 9:00 AM [name], MD [facility] None  04/15/2017 10:00 AM [facility] LINAC 3 [facility] None  04/16/2017 10:00 AM [facility] LINAC 3 [facility] None  04/17/2017 10:00 AM [facility] LINAC 3 [facility] None  04/20/2017 10:00 AM [facility] LINAC 3 [facility] None  04/21/2017 10:00 AM [facility] LINAC 3 [facility] None  04/22/2017 10:00 AM [facility] LINAC 3 [facility] None    No orders of the defined types were placed in this encounter.      Subjective:   Patient ID:  Rebecca [name] is a 83 y.o. (DOB Feb 12, 1928) female.  Chief Complaint:  Chief Complaint  Patient presents with  . Fever    HPI Rebecca [name] is an 83 year old female with a history of a stage II a (T2a, N1, M0) non-small cell lung cancer, squamous cell carcinoma with a lingular mass as well as left hilar adenopathy diagnosed in September 2018. She was treated with radiation therapy this morning and is status  post dosing with carboplatin and paclitaxel on 03/10/2017. According to the patient's caregivers at the facility where she resides she had a fever of 101.1 at 6:30 AM today and was given Tylenol. Her  temperature returned at 99.5 at 8 AM. Her temperature currently is 97.6. In addition to her diagnosis of a non-small cell lung cancer she additionally has pulmonary fibrosis. The patient has dementia and is a poor historian. She only answers that she is doing well. According to radiation therapy her blood pressure was 82/45 this morning. It repeated at 109/56 and our office with a pulse of 70 and oxygen saturation of 95% on 2 L via nasal cannula. The patient's son states that she does not drink much fluid and is constantly being reminded to push fluids.  Medications: I have reviewed the patient's current medications.  Allergies:  Allergies  Allergen Reactions  . Macrodantin [Nitrofurantoin]   . Morphine And Related Other (See Comments)    Altered mental state   . Sulfa Antibiotics Other (See Comments)    Reaction: unknown    Past Medical History:  Diagnosis Date  . Anemia, unspecified 03/18/2011  . Anxiety state, unspecified 01/2011  . Blood in stool 06/15/2012  . Corns and callosities 07/01/2011  . Dementia   . Diverticulosis 02/2011  . Dizziness   . DJD (degenerative joint disease)   . Dysuria 06/17/2011  . Hemorrhoids 02/2011  . Low back pain   . Lung cancer ([place])    left  . Mitral valve problem thickened   thickened  . Osteoarthritis of both hands 10/09/2015  . Osteoporosis 02/2011  . Other abnormal blood chemistry 0/30/2012  . Other acquired deformity of toe 12/16/2011  . Pain in joint, site unspecified 01/2012  . 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 Sacroiliitis, not elsewhere classified ([place]) 05/2011  . Seizures ([place])    Remotely  . Unspecified hypothyroidism 01/2011  . 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 Vitamin D deficiency 01/2011    Past Surgical History:  Procedure Laterality Date  . bletheroplasty    . CATARACT EXTRACTION W/ INTRAOCULAR LENS  IMPLANT, BILATERAL  2010  . CERVICAL LAMINECTOMY  2005  .  CYSTOSCOPY WITH RETROGRADE PYELOGRAM, URETEROSCOPY AND STENT PLACEMENT Bilateral 11/16/2016   Procedure: CYSTOSCOPY WITH RETROGRADE PYELOGRAMS/CLOT EVACUATION;  Surgeon: Irine Seal, MD;  Location: [facility];  Service: Urology;  Laterality: Bilateral;  . HAMMER TOE SURGERY  2013   right 2nd toe Dr. Mallie Mussel  . IVC FILTER PLACEMENT ([facility] HX)    . JOINT REPLACEMENT Bilateral 2002 Right, 1992 Left   knees  . TRANSURETHRAL RESECTION OF BLADDER TUMOR N/A 11/16/2016   Procedure: TRANSURETHRAL RESECTION OF BLADDER TUMOR (TURBT);  Surgeon: Irine Seal, MD;  Location: [facility];  Service: Urology;  Laterality: N/A;    Family History  Problem Relation Age of Onset  . Alzheimer's disease Sister   . Emphysema Sister   . Emphysema Father   . Rheumatologic disease Neg Hx     Social History   Social History  . Marital status: Widowed    Spouse name: N/A  . Number of children: N/A  . Years of education: N/A   Occupational History  . Not on file.   Social History Main Topics  . Smoking status: Former Smoker    Packs/day: 0.50    Years: 50.00    Quit date: 09/14/1988  . Smokeless tobacco: Never Used     Comment: Smoked <1ppd  .  Alcohol use 0.6 oz/week    1 Glasses of wine per week     Comment: doesn't drink routinely & only on social occasions  . Drug use: No  . Sexual activity: No   Other Topics Concern  . Not on file   Social History Narrative   Lives at [facility] since 10/07/2010, moved to [place] 01/16/2015   Widowed   Exercise class 3 times a week     Never smoked   Alcholol wine social    POA, Living Will, DNR      [facility]:   Primary MPOA:  Christell Faith (Daughter) - [phone]   Secondary MPOA:  Loralai Eisman [facility]) - [phone]      [facility] (11/28/16):   Originally from [facility]. Lived in [place] for 2 years from 1960-1961. No pets currently. No bird or mold exposure. 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     Past Medical History,  Surgical history, Social history, and Family history were reviewed and updated as appropriate.   Please see review of systems for further details on the patient's review from today.   Review of Systems:  Review of Systems  Constitutional: Positive for fever. Negative for chills and diaphoresis.  Respiratory: Positive for shortness of breath. Negative for cough.   Cardiovascular: Negative for leg swelling.    Objective:   Physical Exam:  BP (!) 109/56 (BP Location: Left Arm, Patient Position: Sitting)   Pulse 70   Temp 97.6 F (36.4 C) (Oral)   Resp 16   Ht 5\' 2"  (1.575 m)   Wt 141 lb 1.6 oz (64 kg)   SpO2 95%   BMI 25.81 kg/m  ECOG: 1  Physical Exam  Constitutional: No distress.  The patient is an elderly pleasant female who appears to be in no apparent distress. She is receiving oxygen via nasal cannula.  HENT:  Head: Normocephalic and atraumatic.  Cardiovascular: Normal rate, regular rhythm and normal heart sounds.  Exam reveals no gallop and no friction rub.   No murmur heard. Pulmonary/Chest: Effort normal.  Diffuse crackles are heard through lower lung fields bilaterally. Decreased breath sounds are noted in the upper lung fields bilaterally.  Musculoskeletal: She exhibits no edema.  Skin: Skin is warm and dry. She is not diaphoretic.    Lab Review:     Component Value Date/Time   NA 138 03/13/2017 1252   K 4.4 03/13/2017 1252   CL 103 11/17/2016 0454   CO2 26 03/13/2017 1252   GLUCOSE 120 03/13/2017 1252   BUN 34.9 (H) 03/13/2017 1252   CREATININE 0.9 03/13/2017 1252   CALCIUM 9.0 03/13/2017 1252   PROT 6.8 03/13/2017 1252   ALBUMIN 3.2 (L) 03/13/2017 1252   AST 24 03/13/2017 1252   ALT 17 03/13/2017 1252   ALKPHOS 86 03/13/2017 1252   BILITOT 0.23 03/13/2017 1252   GFRNONAA >60 11/17/2016 0454   GFRAA >60 11/17/2016 0454       Component Value Date/Time   WBC 4.7 03/13/2017 1252   WBC 7.3 01/29/2017 0944   RBC 3.98 03/13/2017 1252   RBC 4.26  01/29/2017 0944   HGB 13.0 03/13/2017 1252   HCT 39.2 03/13/2017 1252   PLT 158 03/13/2017 1252   MCV 98.5 03/13/2017 1252   MCH 32.5 03/13/2017 1252   MCH 32.2 01/29/2017 0944   MCHC 33.0 03/13/2017 1252   MCHC 32.5 01/29/2017 0944   RDW 13.4 03/13/2017 1252   LYMPHSABS 0.6 (L) 03/13/2017 1252  MONOABS 0.6 03/13/2017 1252   EOSABS 0.1 03/13/2017 1252   BASOSABS 0.0 03/13/2017 1252   -------------------------------  Imaging from last 24 hours (if applicable):  Radiology interpretation: No results found.

## 2017-03-13 NOTE — Progress Notes (Signed)
Location:  [facility] Room Number: 36 Place of Service:  ALF [phone]) Provider: Dinah [name] FNP-C  [name], MD  Patient Care Team: [name], MD as PCP - General (Internal Medicine) [name], [facility] [name], MD as Consulting Physician (Orthopedic Surgery) [name], MD as Consulting Physician (Physical Medicine and Rehabilitation) Melina Schools, MD as Consulting Physician (Orthopedic Surgery) [name], MD as Consulting Physician (Otolaryngology) [name], [name], NP as Nurse Practitioner (Family Medicine)  Extended Emergency Contact Information Primary Emergency Contact: [name] Address: [address]          [name] of [place] Phone: [phone] Relation: Son Secondary Emergency Contact: [name] [place] of [place] Phone: [phone] Mobile Phone: [phone] Relation: Daughter  Code Status:  DNR Goals of care: Advanced Directive information Advanced Directives 03/13/2017  Does Patient Have a Medical Advance Directive? Yes  Type of Paramedic of [place];Living will  Does patient want to make changes to medical advance directive? -  Copy of [place] in Chart? -  Pre-existing out of facility DNR order (yellow form or pink MOST form) -     Chief Complaint  Patient presents with  . Acute Visit    fever    HPI:  Pt is a 83 y.o. female seen today at [facility] for an acute visit for evaluation of fever. She is seen in her room today per facility Nurse request. Nurse reports patient's Temp was 101 prior to visit. Tylenol tablet was given Temp now down to 99.7. Patient due for chemotherapy treatment. Patient's Oncology office and chemotherapy infusion center notified by facility Nurse. Patient's Temp to be managed by PCP per patient's oncology office. Patient denies any chills, worsening cough or urinary infections symptoms.      Past Medical History:  Diagnosis Date  . Anemia, unspecified 03/18/2011  . Anxiety state, unspecified 01/2011  . Blood in stool 06/15/2012  . Corns and callosities 07/01/2011  . Dementia   . Diverticulosis 02/2011  . Dizziness   . DJD (degenerative joint disease)   . Dysuria 06/17/2011  . Hemorrhoids 02/2011  . Low back pain   . Lung cancer ([place])    left  . Mitral valve problem thickened   thickened  . Osteoarthritis of both hands 10/09/2015  . Osteoporosis 02/2011  . Other abnormal blood chemistry 0/30/2012  . Other acquired deformity of toe 12/16/2011  . Pain in joint, site unspecified 01/2012  . 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 Sacroiliitis, not elsewhere classified ([place]) 05/2011  . Seizures ([place])    Remotely  . Unspecified hypothyroidism 01/2011  . 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 Vitamin D deficiency 01/2011   Past Surgical History:  Procedure Laterality Date  . bletheroplasty    . CATARACT EXTRACTION W/ INTRAOCULAR LENS  IMPLANT, BILATERAL  2010  . CERVICAL LAMINECTOMY  2005  . CYSTOSCOPY WITH RETROGRADE PYELOGRAM, URETEROSCOPY AND STENT PLACEMENT Bilateral 11/16/2016   Procedure: CYSTOSCOPY WITH RETROGRADE PYELOGRAMS/CLOT EVACUATION;  Surgeon: Irine Seal, MD;  [facility];  Service: Urology;  Laterality: Bilateral;  . HAMMER TOE SURGERY  2013   right 2nd toe Dr. Mallie Mussel  . IVC FILTER PLACEMENT ([facility] HX)    . JOINT REPLACEMENT Bilateral 2002 Right, 1992 Left   knees  . TRANSURETHRAL RESECTION OF BLADDER TUMOR N/A 11/16/2016   Procedure: TRANSURETHRAL RESECTION OF BLADDER TUMOR (TURBT);  Surgeon: Jeffie Pollock,  Jenny Reichmann, MD;  [facility];  Service: Urology;  Laterality: N/A;    Allergies  Allergen Reactions  . Macrodantin [Nitrofurantoin]   . Morphine And Related Other (See Comments)    Altered mental state   . Sulfa Antibiotics Other (See Comments)    Reaction: unknown    Outpatient Encounter  Prescriptions as of 03/13/2017  Medication Sig  . acetaminophen (TYLENOL) 325 MG tablet Take 650 mg by mouth 2 (two) times daily.  Marland Kitchen amoxicillin (AMOXIL) 500 MG tablet Take by mouth as needed. Give 4 tablets one hour prior to procedure.  . Calcium Carbonate-Vitamin D (CALCIUM 600+D) 600-400 MG-UNIT tablet Take 1 tablet by mouth daily.  . Cholecalciferol (VITAMIN D3) 5000 units TABS Take 1 tablet by mouth daily. Take one tablet daily   . clonazePAM (KLONOPIN) 0.5 MG tablet Take 1 tablet (0.5 mg total) by mouth as directed. Take one tablet every morning scheduled and then at bedtime as needed.  . donepezil (ARICEPT) 10 MG tablet Take 10 mg by mouth. Take one tablet in evening for memory  . DULoxetine (CYMBALTA) 30 MG capsule Take 30 mg by mouth daily.   Marland Kitchen levothyroxine (SYNTHROID, LEVOTHROID) 100 MCG tablet Take 1 tablet (100 mcg total) by mouth daily before breakfast. For thyroid  . meloxicam (MOBIC) 7.5 MG tablet Take 7.5 mg by mouth daily as needed for pain.   . memantine (NAMENDA) 10 MG tablet Take 10 mg by mouth 2 (two) times daily.  . Multiple Vitamins-Minerals (THERA M PLUS PO) Take 1 tablet by mouth daily.   . Omega-3 1000 MG CAPS Take 1 capsule by mouth daily. Take one capsule every morning   . omeprazole (PRILOSEC) 20 MG capsule Take 20 mg by mouth at bedtime.  . OXYGEN Inhale 2 L/min into the lungs as needed. To keep O2 Sat >90%  . phenytoin (DILANTIN) 100 MG ER capsule Take 100-200 mg by mouth See admin instructions. Take one capsule every morning; take 2 capsules at bedtime   . prochlorperazine (COMPAZINE) 10 MG tablet Take 1 tablet (10 mg total) by mouth every 6 (six) hours as needed for nausea or vomiting.  Marland Kitchen QUEtiapine (SEROQUEL) 25 MG tablet Take 12.5 mg by mouth daily. At 8 pm  . triamcinolone cream (KENALOG) 0.1 % Apply 1 application topically daily as needed (for rash).   . trolamine salicylate (ASPERCREME) 10 % cream Apply 1 application topically. Apply as needed  .  umeclidinium-vilanterol (ANORO ELLIPTA) 62.5-25 MCG/INH AEPB Inhale 1 puff into the lungs daily.   No facility-administered encounter medications on file as of 03/13/2017.     Review of Systems  Constitutional: Positive for fever. Negative for activity change, appetite change, chills and fatigue.  HENT: Positive for hearing loss. Negative for congestion, rhinorrhea, sinus pressure, sinus pain, sneezing and sore throat.   Eyes: Negative for pain, redness and itching.  Respiratory: Negative for chest tightness, shortness of breath and wheezing.   Cardiovascular: Negative for chest pain, palpitations and leg swelling.  Gastrointestinal: Negative for abdominal distention, abdominal pain, constipation, diarrhea, nausea and vomiting.  Genitourinary: Negative for dysuria, flank pain, frequency and urgency.  Musculoskeletal: Positive for gait problem.  Skin: Negative for color change, pallor, rash and wound.  Neurological: Negative for dizziness, syncope, light-headedness and headaches.  Psychiatric/Behavioral: Positive for confusion. Negative for agitation, hallucinations and sleep disturbance.    Immunization History  Administered Date(s) Administered  . Influenza Split 02/18/2017  . Influenza Whole 03/17/2007, 02/16/2009, 01/30/2010, 02/16/2013  . Influenza-Unspecified 03/02/2014, 02/22/2015, 02/28/2016  .  PPD Test 07/09/2010, 02/04/2015, 04/04/2015, 06/04/2015  . Pneumococcal Conjugate-13 01/13/2017  . Pneumococcal Polysaccharide-23 05/19/2005, 05/23/2013  . Td 05/19/2005  . Tetanus 10/06/2012   Pertinent  Health Maintenance Due  Topic Date Due  . DEXA SCAN  05/19/2017 (Originally 11/19/1994)  . INFLUENZA VACCINE  Completed  . PNA vac Low Risk Adult  Completed   Fall Risk  03/13/2017 02/18/2017 01/05/2017 10/09/2015 07/10/2015  Falls in the past year? No Yes No No Yes  Number falls in past yr: - 2 or more - - 1  Injury with Fall? - No - - Yes  Comment - - - - scape on her left leg    Risk Factor Category  - High Fall Risk - - -    Vitals:   03/13/17 1143  BP: (!) 106/54  Pulse: 76  Resp: 18  Temp: 99.7 F (37.6 C)  SpO2: 93%  Weight: 141 lb 9.6 oz (64.2 kg)  Height: 5\' 2"  (1.575 m)   Body mass index is 25.9 kg/m. Physical Exam  Constitutional:  Thin frail elderly in no acute distress   HENT:  Head: Normocephalic.  Right Ear: External ear normal.  Left Ear: External ear normal.  Mouth/Throat: Oropharynx is clear and moist. No oropharyngeal exudate.  Eyes: Conjunctivae and EOM are normal. Pupils are equal, round, and reactive to light. Right eye exhibits no discharge. Left eye exhibits no discharge. No scleral icterus.  Neck: Normal range of motion. No JVD present. No thyromegaly present.  Cardiovascular: Intact distal pulses. Exam reveals no friction rub.  Murmur heard. Pulmonary/Chest: Effort normal and breath sounds normal. No respiratory distress. She has no wheezes. She has no rales.  Abdominal: Soft. Bowel sounds are normal. She exhibits no distension. There is no tenderness. There is no rebound and no guarding.  Musculoskeletal: She exhibits no edema or tenderness.  Unsteady gait uses FWW. Moves x 4 extremities.   Lymphadenopathy:    She has no cervical adenopathy.  Neurological: Coordination normal.  Pleasantly confused at her baseline  Skin: Skin is warm and dry. No rash noted. No erythema. No pallor.  Psychiatric: She has a normal mood and affect.   Labs reviewed:  Recent Labs  11/13/16 1917 11/15/16 0347 11/17/16 0454 11/20/16 02/25/17 1156 03/10/17 1107  NA 136 142 140 140 141 138  K 4.0 3.5 3.6 4.3 4.6 4.3  CL 106 111 103  --   --   --   CO2 25 26 31   --  27 25  GLUCOSE 123* 93 95  --  87 86  BUN 18 9 10 17  21.3 27.1*  CREATININE 0.75 0.60 0.53 0.6 0.9 0.9  CALCIUM 8.2* 8.4* 8.5*  --  9.4 9.0    Recent Labs  11/15/16 0347 02/25/17 1156 03/10/17 1107  AST 20 23 21   ALT 17 14 13   ALKPHOS 54 108 98  BILITOT 0.7 0.29 0.30   PROT 5.4* 7.5 7.1  ALBUMIN 2.7* 3.7 3.4*    Recent Labs  11/13/16 1917  01/29/17 0944 02/25/17 1155 03/10/17 1107  WBC 11.3*  < > 7.3 7.8 6.7  NEUTROABS 9.5*  --   --  5.6 4.8  HGB 12.7  < > 13.7 13.5 13.6  HCT 39.0  < > 42.2 41.6 40.6  MCV 97.7  < > 99.1 100.5 97.1  PLT 136*  < > 185 180 203  < > = values in this interval not displayed. Lab Results  Component Value Date   TSH 0.533 11/13/2016  Lab Results  Component Value Date   HGBA1C 5.4 03/08/2015   Lab Results  Component Value Date   CHOL 235 (H) 03/06/2009   HDL 108.40 03/06/2009   LDLDIRECT 108.2 03/06/2009    Significant Diagnostic Results in last 30 days:  No results found.  Assessment/Plan   Fever Temp was reported 101.0 prior to visit. Tylenol was given with much improvement. Patient asymptomatic. Suspect possible due to chemotherapy treatment. Will obtain stat CBC/diff and CMP to rule out infectious and metabolic etiology.Discussed patient's condition with Dr. Bubba Camp. She agrees with plan. Continue to monitor vital signs every shift for now.    Family/ staff Communication: Reviewed plan of care with patient and facility Nurse supervisor  Labs/tests ordered: stat CBC/diff and CMP  Dinah C [name], NP

## 2017-03-13 NOTE — Progress Notes (Addendum)
[place] by after radiation to her left lung after having an elevated temperature at 0630 this morning at the nursing home, was given Tylenol at 0800 her temperature was down to 99.5. 97.8 at 1200-70-18  79/46 left arm and 84/50 left arm right arm 84/50 denies feeling dizzy,states she is eating and drinking.  Received her first cheomtherapy treatment on Tuesday 03-10-17. O2 at 2 liter min. All of the time except when up to the bathroom.  Skin warm and dry to touch.  Alert and talkative with some confusion during the conversation. Son with her today. 65 Spoke with Dr. Lisbeth Renshaw about the above siuation and he recommended sending her to Symptom Management for evaluation before going back to the nursing home.

## 2017-03-13 NOTE — Telephone Encounter (Signed)
Voicemail received from "[name] son of Dr. Worthy Flank patient requesting return call [phone]. Mom received first chemotherapy treatment Tuesday.  This morning she developed fever of 101.1.  [facility] nurse gave tylenol which brought temp down to 99.9.  The sheet says to cal for temp > 100.5 .  Need to know what to do.  She's scheduled for radiation at 11:15 am today.."   Message forwarded to ext [phone].  Further patient communication from collaborative nurse.

## 2017-03-13 NOTE — Telephone Encounter (Signed)
Spoke with RT staff, they will assess in RT. If they feel she needs to be seen, they will make an appt with [facility] today. Son notified

## 2017-03-16 ENCOUNTER — Other Ambulatory Visit (HOSPITAL_BASED_OUTPATIENT_CLINIC_OR_DEPARTMENT_OTHER): Payer: Medicare Other

## 2017-03-16 ENCOUNTER — Encounter: Payer: Self-pay | Admitting: Internal Medicine

## 2017-03-16 ENCOUNTER — Telehealth: Payer: Self-pay | Admitting: Internal Medicine

## 2017-03-16 ENCOUNTER — Ambulatory Visit: Payer: Medicare Other | Admitting: Nutrition

## 2017-03-16 ENCOUNTER — Ambulatory Visit (HOSPITAL_BASED_OUTPATIENT_CLINIC_OR_DEPARTMENT_OTHER): Payer: Medicare Other | Admitting: Internal Medicine

## 2017-03-16 ENCOUNTER — Ambulatory Visit
Admission: RE | Admit: 2017-03-16 | Discharge: 2017-03-16 | Disposition: A | Discharge status: Home / Self Care | Payer: Medicare Other | Source: Ambulatory Visit | Attending: Radiation Oncology | Admitting: Radiation Oncology

## 2017-03-16 ENCOUNTER — Ambulatory Visit (HOSPITAL_BASED_OUTPATIENT_CLINIC_OR_DEPARTMENT_OTHER): Payer: Medicare Other

## 2017-03-16 VITALS — BP 101/54 | HR 75 | Temp 98.1°F | Resp 18 | Ht 62.0 in | Wt 137.1 lb

## 2017-03-16 DIAGNOSIS — C771 Secondary and unspecified malignant neoplasm of intrathoracic lymph nodes: Secondary | ICD-10-CM

## 2017-03-16 DIAGNOSIS — C3412 Malignant neoplasm of upper lobe, left bronchus or lung: Secondary | ICD-10-CM | POA: Diagnosis not present

## 2017-03-16 DIAGNOSIS — Z5111 Encounter for antineoplastic chemotherapy: Secondary | ICD-10-CM

## 2017-03-16 DIAGNOSIS — R05 Cough: Secondary | ICD-10-CM

## 2017-03-16 DIAGNOSIS — C3492 Malignant neoplasm of unspecified part of left bronchus or lung: Secondary | ICD-10-CM | POA: Diagnosis not present

## 2017-03-16 DIAGNOSIS — R0609 Other forms of dyspnea: Secondary | ICD-10-CM

## 2017-03-16 DIAGNOSIS — Z9889 Other specified postprocedural states: Secondary | ICD-10-CM | POA: Diagnosis not present

## 2017-03-16 DIAGNOSIS — Z79899 Other long term (current) drug therapy: Secondary | ICD-10-CM | POA: Diagnosis not present

## 2017-03-16 DIAGNOSIS — Z881 Allergy status to other antibiotic agents status: Secondary | ICD-10-CM | POA: Diagnosis not present

## 2017-03-16 DIAGNOSIS — Z510 Encounter for antineoplastic radiation therapy: Secondary | ICD-10-CM | POA: Diagnosis not present

## 2017-03-16 DIAGNOSIS — Z87891 Personal history of nicotine dependence: Secondary | ICD-10-CM | POA: Diagnosis not present

## 2017-03-16 DIAGNOSIS — Z9981 Dependence on supplemental oxygen: Secondary | ICD-10-CM | POA: Diagnosis not present

## 2017-03-16 LAB — CBC WITH DIFFERENTIAL/PLATELET
BASO%: 0.3 % (ref 0.0–2.0)
Basophils Absolute: 0 10*3/uL (ref 0.0–0.1)
EOS ABS: 0.2 10*3/uL (ref 0.0–0.5)
EOS%: 4 % (ref 0.0–7.0)
HCT: 40.7 % (ref 34.8–46.6)
HGB: 13.6 g/dL (ref 11.6–15.9)
LYMPH%: 16 % (ref 14.0–49.7)
MCH: 32.6 pg (ref 25.1–34.0)
MCHC: 33.5 g/dL (ref 31.5–36.0)
MCV: 97.3 fL (ref 79.5–101.0)
MONO#: 0.4 10*3/uL (ref 0.1–0.9)
MONO%: 8.8 % (ref 0.0–14.0)
NEUT#: 3.2 10*3/uL (ref 1.5–6.5)
NEUT%: 70.9 % (ref 38.4–76.8)
PLATELETS: 187 10*3/uL (ref 145–400)
RBC: 4.18 10*6/uL (ref 3.70–5.45)
RDW: 13.6 % (ref 11.2–14.5)
WBC: 4.5 10*3/uL (ref 3.9–10.3)
lymph#: 0.7 10*3/uL — ABNORMAL LOW (ref 0.9–3.3)

## 2017-03-16 LAB — COMPREHENSIVE METABOLIC PANEL
ALT: 24 U/L (ref 0–55)
ANION GAP: 9 meq/L (ref 3–11)
AST: 28 U/L (ref 5–34)
Albumin: 3.3 g/dL — ABNORMAL LOW (ref 3.5–5.0)
Alkaline Phosphatase: 83 U/L (ref 40–150)
BILIRUBIN TOTAL: 0.23 mg/dL (ref 0.20–1.20)
BUN: 30.2 mg/dL — ABNORMAL HIGH (ref 7.0–26.0)
CHLORIDE: 105 meq/L (ref 98–109)
CO2: 28 meq/L (ref 22–29)
Calcium: 9.6 mg/dL (ref 8.4–10.4)
Creatinine: 0.9 mg/dL (ref 0.6–1.1)
EGFR: 57 mL/min/{1.73_m2} — AB (ref >60)
Glucose: 121 mg/dl (ref 70–140)
POTASSIUM: 4.8 meq/L (ref 3.5–5.1)
SODIUM: 142 meq/L (ref 136–145)
TOTAL PROTEIN: 7.1 g/dL (ref 6.4–8.3)

## 2017-03-16 MED ORDER — PALONOSETRON HCL INJECTION 0.25 MG/5ML
0.2500 mg | Freq: Once | INTRAVENOUS | Status: AC
  Administered 2017-03-16: 0.25 mg via INTRAVENOUS

## 2017-03-16 MED ORDER — CARBOPLATIN CHEMO INJECTION 450 MG/45ML
128.8000 mg | Freq: Once | INTRAVENOUS | Status: AC
  Administered 2017-03-16: 130 mg via INTRAVENOUS
  Filled 2017-03-16: qty 13

## 2017-03-16 MED ORDER — PALONOSETRON HCL INJECTION 0.25 MG/5ML
INTRAVENOUS | Status: AC
Start: 2017-03-16 — End: ?
  Filled 2017-03-16: qty 5

## 2017-03-16 MED ORDER — SODIUM CHLORIDE 0.9 % IV SOLN
Freq: Once | INTRAVENOUS | Status: AC
  Administered 2017-03-16: 13:00:00 via INTRAVENOUS

## 2017-03-16 MED ORDER — SODIUM CHLORIDE 0.9 % IV SOLN
20.0000 mg | Freq: Once | INTRAVENOUS | Status: AC
  Administered 2017-03-16: 20 mg via INTRAVENOUS
  Filled 2017-03-16: qty 2

## 2017-03-16 MED ORDER — FAMOTIDINE IN NACL 20-0.9 MG/50ML-% IV SOLN
20.0000 mg | Freq: Once | INTRAVENOUS | Status: AC
  Administered 2017-03-16: 20 mg via INTRAVENOUS

## 2017-03-16 MED ORDER — DIPHENHYDRAMINE HCL 50 MG/ML IJ SOLN
INTRAMUSCULAR | Status: AC
  Filled 2017-03-16: qty 1

## 2017-03-16 MED ORDER — SODIUM CHLORIDE 0.9 % IV SOLN
25.0000 mg | Freq: Once | INTRAVENOUS | Status: AC
  Administered 2017-03-16: 25 mg via INTRAVENOUS
  Filled 2017-03-16: qty 0.5

## 2017-03-16 MED ORDER — SODIUM CHLORIDE 0.9 % IV SOLN
45.0000 mg/m2 | Freq: Once | INTRAVENOUS | Status: AC
  Administered 2017-03-16: 72 mg via INTRAVENOUS
  Filled 2017-03-16: qty 12

## 2017-03-16 MED ORDER — FAMOTIDINE IN NACL 20-0.9 MG/50ML-% IV SOLN
INTRAVENOUS | Status: AC
  Filled 2017-03-16: qty 50

## 2017-03-16 NOTE — Progress Notes (Signed)
83 year old female diagnosed with lung cancer. She is receiving chemo and radiation therapy. Patient lives at [facility] and sees a Dietitian there.  PMH includes seizures, osteoporosis, DJD, Diverticulosis, and Anxiety.  Medications include Klonopin, Cymbalta, Synthroid, MVI, Omega 3 fatty acids, Prilosec, Compazine, and Seroquel.  Height: 5 ft, 2 in. Weight: 137.1 pounds. UBW: 140 pounds. BMI: 25.08  Spoke with patient and her daughter. Patient is confused according to chart.  Patient came to chemotherapy with a boxed lunch from [facility] which included Boost. She states she doesn't want to gain any weight. She eats well and usually eats in the dining room with other residents.  Nutrition Diagnosis: Food and Nutrition Related Knowledge Deficit related to lung cancer and associated treatments as evidenced by no prior need for nutrition related information.  Intervention: Educated patient to try to eat regular meals and maintain her weight.  Encouraged her to drink oral nutrition supplements. Questions answered. Teach back method used. Contact information given.  Monitoring, Evaluation, Goals: Patient will tolerate adequate calories and protein for weight maintenance.  Next Visit: Monday, Nov 19, during chemotherapy.

## 2017-03-16 NOTE — Patient Instructions (Signed)
[facility] Discharge Instructions for Patients Receiving Chemotherapy  Today you received the following chemotherapy agents:  Taxol, Carboplatin  To help prevent nausea and vomiting after your treatment, we encourage you to take your nausea medication as prescribed.   If you develop nausea and vomiting that is not controlled by your nausea medication, call the clinic.   BELOW ARE SYMPTOMS THAT SHOULD BE REPORTED IMMEDIATELY:  *FEVER GREATER THAN 100.5 F  *CHILLS WITH OR WITHOUT FEVER  NAUSEA AND VOMITING THAT IS NOT CONTROLLED WITH YOUR NAUSEA MEDICATION  *UNUSUAL SHORTNESS OF BREATH  *UNUSUAL BRUISING OR BLEEDING  TENDERNESS IN MOUTH AND THROAT WITH OR WITHOUT PRESENCE OF ULCERS  *URINARY PROBLEMS  *BOWEL PROBLEMS  UNUSUAL RASH Items with * indicate a potential emergency and should be followed up as soon as possible.  Feel free to call the clinic should you have any questions or concerns. The clinic phone number is [phone].  Please show the CHEMO ALERT CARD at check-in to the Emergency Department and triage nurse.   

## 2017-03-16 NOTE — Progress Notes (Signed)
[place] [phone]   [phone]  OFFICE PROGRESS NOTE  [name], MD [name] [address]  DIAGNOSIS: Stage IIA (T2a, N1, M0) non-small cell lung cancer, squamous cell carcinoma presented with lingual mass as well as left hilar adenopathy diagnosed in September 2018.  PRIOR THERAPY: None  CURRENT THERAPY: A course of concurrent chemoradiation with weekly carboplatin for AUC of 2 and paclitaxel 45 MG/M2. Status post 1 cycle.  INTERVAL HISTORY: Rebecca [name] 83 y.o. female returns to the clinic today for follow-up visit accompanied by her daughter. The patient is feeling fine today with no specific complaints except for dry cough. She tolerated the first week of her treatment with concurrent chemoradiation fairly well. She denied having any chest pain but has shortness of breath at baseline and increased with exertion and she is currently on home oxygen. She denied having any hemoptysis. She denied having any weight loss or night sweats. She has no nausea, vomiting, diarrhea or constipation. She today for evaluation before starting cycle #2.  MEDICAL HISTORY: Past Medical History:  Diagnosis Date  . Anemia, unspecified 03/18/2011  . Anxiety state, unspecified 01/2011  . Blood in stool 06/15/2012  . Corns and callosities 07/01/2011  . Dementia   . Diverticulosis 02/2011  . Dizziness   . DJD (degenerative joint disease)   . Dysuria 06/17/2011  . Hemorrhoids 02/2011  . Low back pain   . Lung cancer ([place])    left  . Mitral valve problem thickened   thickened  . Osteoarthritis of both hands 10/09/2015  . Osteoporosis 02/2011  . Other abnormal blood chemistry 0/30/2012  . Other acquired deformity of toe 12/16/2011  . Pain in joint, site unspecified 01/2012  . 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 Sacroiliitis, not elsewhere classified ([place]) 05/2011  . Seizures ([place])    Remotely  .  Unspecified hypothyroidism 01/2011  . 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 Vitamin D deficiency 01/2011    ALLERGIES:  is allergic to macrodantin [nitrofurantoin]; morphine and related; and sulfa antibiotics.  MEDICATIONS:  Current Outpatient Prescriptions  Medication Sig Dispense Refill  . acetaminophen (TYLENOL) 325 MG tablet Take 650 mg by mouth 2 (two) times daily.    Marland Kitchen amoxicillin-clavulanate (AUGMENTIN) 875-125 MG tablet Take 1 tablet by mouth 2 (two) times daily. 14 tablet 0  . Calcium Carbonate-Vitamin D (CALCIUM 600+D) 600-400 MG-UNIT tablet Take 1 tablet by mouth daily.    . Cholecalciferol (VITAMIN D3) 5000 units TABS Take 1 tablet by mouth daily. Take one tablet daily     . clonazePAM (KLONOPIN) 0.5 MG tablet Take 1 tablet (0.5 mg total) by mouth as directed. Take one tablet every morning scheduled and then at bedtime as needed. 15 tablet 0  . donepezil (ARICEPT) 10 MG tablet Take 10 mg by mouth. Take one tablet in evening for memory    . DULoxetine (CYMBALTA) 30 MG capsule Take 30 mg by mouth daily.     Marland Kitchen levothyroxine (SYNTHROID, LEVOTHROID) 100 MCG tablet Take 1 tablet (100 mcg total) by mouth daily before breakfast. For thyroid 90 tablet 3  . meloxicam (MOBIC) 7.5 MG tablet Take 7.5 mg by mouth daily as needed for pain.     . memantine (NAMENDA) 10 MG tablet Take 10 mg by mouth 2 (two) times daily.    . Multiple Vitamins-Minerals (THERA M PLUS PO) Take 1 tablet by mouth daily.     Marland Kitchen  Omega-3 1000 MG CAPS Take 1 capsule by mouth daily. Take one capsule every morning     . omeprazole (PRILOSEC) 20 MG capsule Take 20 mg by mouth at bedtime.    . OXYGEN Inhale 2 L/min into the lungs as needed. To keep O2 Sat >90%    . phenytoin (DILANTIN) 100 MG ER capsule Take 100-200 mg by mouth See admin instructions. Take one capsule every morning; take 2 capsules at bedtime     . prochlorperazine (COMPAZINE) 10 MG tablet Take 1 tablet (10 mg total) by mouth every 6  (six) hours as needed for nausea or vomiting. 30 tablet 0  . QUEtiapine (SEROQUEL) 25 MG tablet Take 12.5 mg by mouth daily. At 8 pm    . triamcinolone cream (KENALOG) 0.1 % Apply 1 application topically daily as needed (for rash).     . trolamine salicylate (ASPERCREME) 10 % cream Apply 1 application topically. Apply as needed    . umeclidinium-vilanterol (ANORO ELLIPTA) 62.5-25 MCG/INH AEPB Inhale 1 puff into the lungs daily. 1 each 6  . amoxicillin (AMOXIL) 500 MG tablet Take by mouth as needed. Give 4 tablets one hour prior to procedure.     No current facility-administered medications for this visit.     SURGICAL HISTORY:  Past Surgical History:  Procedure Laterality Date  . bletheroplasty    . CATARACT EXTRACTION W/ INTRAOCULAR LENS  IMPLANT, BILATERAL  2010  . CERVICAL LAMINECTOMY  2005  . CYSTOSCOPY WITH RETROGRADE PYELOGRAM, URETEROSCOPY AND STENT PLACEMENT Bilateral 11/16/2016   Procedure: CYSTOSCOPY WITH RETROGRADE PYELOGRAMS/CLOT EVACUATION;  Surgeon: Irine Seal, MD;  Location: [facility];  Service: Urology;  Laterality: Bilateral;  . HAMMER TOE SURGERY  2013   right 2nd toe Dr. Mallie Mussel  . IVC FILTER PLACEMENT ([facility] HX)    . JOINT REPLACEMENT Bilateral 2002 Right, 1992 Left   knees  . TRANSURETHRAL RESECTION OF BLADDER TUMOR N/A 11/16/2016   Procedure: TRANSURETHRAL RESECTION OF BLADDER TUMOR (TURBT);  Surgeon: Irine Seal, MD;  Location: [facility];  Service: Urology;  Laterality: N/A;    REVIEW OF SYSTEMS:  A comprehensive review of systems was negative except for: Constitutional: positive for fatigue Respiratory: positive for cough and dyspnea on exertion   PHYSICAL EXAMINATION: General appearance: alert, cooperative, fatigued and no distress Head: Normocephalic, without obvious abnormality, atraumatic Neck: no adenopathy, no JVD, supple, symmetrical, trachea midline and thyroid not enlarged, symmetric, no tenderness/mass/nodules Lymph nodes: Cervical, supraclavicular, and axillary  nodes normal. Resp: wheezes bilaterally Back: symmetric, no curvature. ROM normal. No CVA tenderness. Cardio: regular rate and rhythm, S1, S2 normal, no murmur, click, rub or gallop GI: soft, non-tender; bowel sounds normal; no masses,  no organomegaly Extremities: extremities normal, atraumatic, no cyanosis or edema  ECOG PERFORMANCE STATUS: 1 - Symptomatic but completely ambulatory  Blood pressure (!) 101/54, pulse 75, temperature 98.1 F (36.7 C), temperature source Oral, resp. rate 18, height 5\' 2"  (1.575 m), weight 137 lb 1.6 oz (62.2 kg), SpO2 95 %.  LABORATORY DATA: Lab Results  Component Value Date   WBC 4.5 03/16/2017   HGB 13.6 03/16/2017   HCT 40.7 03/16/2017   MCV 97.3 03/16/2017   PLT 187 03/16/2017      Chemistry      Component Value Date/Time   NA 142 03/16/2017 1031   K 4.8 03/16/2017 1031   CL 103 11/17/2016 0454   CO2 28 03/16/2017 1031   BUN 30.2 (H) 03/16/2017 1031   CREATININE 0.9 03/16/2017 1031   GLU  105 11/20/2016      Component Value Date/Time   CALCIUM 9.6 03/16/2017 1031   ALKPHOS 83 03/16/2017 1031   AST 28 03/16/2017 1031   ALT 24 03/16/2017 1031   BILITOT 0.23 03/16/2017 1031       RADIOGRAPHIC STUDIES: No results found.  ASSESSMENT AND PLAN: this is a very pleasant 83 years old white female with unresectable stage IIA non-small cell lung cancer, squamous cell carcinoma. The patient is currently undergoing a course of concurrent chemoradiation with weekly carboplatin and paclitaxel status post 1 cycle and tolerated the first week of her treatment fairly well. I recommended for the patient to proceed with cycle #2 today as a scheduled. I will see her back for follow-up visit in 2 weeks for evaluation before starting cycle #4. The patient was advised to call immediately if she has any concerning symptoms in the interval. The patient voices understanding of current disease status and treatment options and is in agreement with the current  care plan.  All questions were answered. The patient knows to call the clinic with any problems, questions or concerns. We can certainly see the patient much sooner if necessary.  I spent 10 minutes counseling the patient face to face. The total time spent in the appointment was 15 minutes.  Disclaimer: This note was dictated with voice recognition software. Similar sounding words can inadvertently be transcribed and may not be corrected upon review.

## 2017-03-16 NOTE — Telephone Encounter (Signed)
No additional appts to schedule- all appts already scheduled per treatment plan.

## 2017-03-17 ENCOUNTER — Ambulatory Visit
Admission: RE | Admit: 2017-03-17 | Discharge: 2017-03-17 | Disposition: A | Discharge status: Home / Self Care | Payer: Medicare Other | Source: Ambulatory Visit | Attending: Radiation Oncology | Admitting: Radiation Oncology

## 2017-03-17 DIAGNOSIS — Z79899 Other long term (current) drug therapy: Secondary | ICD-10-CM | POA: Diagnosis not present

## 2017-03-17 DIAGNOSIS — Z9889 Other specified postprocedural states: Secondary | ICD-10-CM | POA: Diagnosis not present

## 2017-03-17 DIAGNOSIS — C3492 Malignant neoplasm of unspecified part of left bronchus or lung: Secondary | ICD-10-CM | POA: Diagnosis not present

## 2017-03-17 DIAGNOSIS — Z510 Encounter for antineoplastic radiation therapy: Secondary | ICD-10-CM | POA: Diagnosis not present

## 2017-03-17 DIAGNOSIS — Z87891 Personal history of nicotine dependence: Secondary | ICD-10-CM | POA: Diagnosis not present

## 2017-03-17 DIAGNOSIS — Z881 Allergy status to other antibiotic agents status: Secondary | ICD-10-CM | POA: Diagnosis not present

## 2017-03-17 DIAGNOSIS — C3412 Malignant neoplasm of upper lobe, left bronchus or lung: Secondary | ICD-10-CM | POA: Diagnosis not present

## 2017-03-18 ENCOUNTER — Ambulatory Visit
Admission: RE | Admit: 2017-03-18 | Discharge: 2017-03-18 | Disposition: A | Discharge status: Home / Self Care | Payer: Medicare Other | Source: Ambulatory Visit | Attending: Radiation Oncology | Admitting: Radiation Oncology

## 2017-03-18 DIAGNOSIS — C3412 Malignant neoplasm of upper lobe, left bronchus or lung: Secondary | ICD-10-CM | POA: Diagnosis not present

## 2017-03-18 DIAGNOSIS — Z881 Allergy status to other antibiotic agents status: Secondary | ICD-10-CM | POA: Diagnosis not present

## 2017-03-18 DIAGNOSIS — Z510 Encounter for antineoplastic radiation therapy: Secondary | ICD-10-CM | POA: Diagnosis not present

## 2017-03-18 DIAGNOSIS — Z9889 Other specified postprocedural states: Secondary | ICD-10-CM | POA: Diagnosis not present

## 2017-03-18 DIAGNOSIS — Z87891 Personal history of nicotine dependence: Secondary | ICD-10-CM | POA: Diagnosis not present

## 2017-03-18 DIAGNOSIS — Z79899 Other long term (current) drug therapy: Secondary | ICD-10-CM | POA: Diagnosis not present

## 2017-03-18 DIAGNOSIS — C3492 Malignant neoplasm of unspecified part of left bronchus or lung: Secondary | ICD-10-CM | POA: Diagnosis not present

## 2017-03-19 ENCOUNTER — Ambulatory Visit
Admission: RE | Admit: 2017-03-19 | Discharge: 2017-03-19 | Disposition: A | Discharge status: Home / Self Care | Payer: Medicare Other | Source: Ambulatory Visit | Attending: Radiation Oncology | Admitting: Radiation Oncology

## 2017-03-19 DIAGNOSIS — C3492 Malignant neoplasm of unspecified part of left bronchus or lung: Secondary | ICD-10-CM | POA: Diagnosis not present

## 2017-03-19 DIAGNOSIS — Z510 Encounter for antineoplastic radiation therapy: Secondary | ICD-10-CM | POA: Diagnosis not present

## 2017-03-19 DIAGNOSIS — Z79899 Other long term (current) drug therapy: Secondary | ICD-10-CM | POA: Diagnosis not present

## 2017-03-19 DIAGNOSIS — C3412 Malignant neoplasm of upper lobe, left bronchus or lung: Secondary | ICD-10-CM | POA: Diagnosis not present

## 2017-03-19 DIAGNOSIS — Z881 Allergy status to other antibiotic agents status: Secondary | ICD-10-CM | POA: Diagnosis not present

## 2017-03-19 DIAGNOSIS — Z9889 Other specified postprocedural states: Secondary | ICD-10-CM | POA: Diagnosis not present

## 2017-03-19 DIAGNOSIS — Z87891 Personal history of nicotine dependence: Secondary | ICD-10-CM | POA: Diagnosis not present

## 2017-03-20 ENCOUNTER — Ambulatory Visit
Admission: RE | Admit: 2017-03-20 | Discharge: 2017-03-20 | Disposition: A | Discharge status: Home / Self Care | Payer: Medicare Other | Source: Ambulatory Visit | Attending: Radiation Oncology | Admitting: Radiation Oncology

## 2017-03-20 DIAGNOSIS — Z881 Allergy status to other antibiotic agents status: Secondary | ICD-10-CM | POA: Diagnosis not present

## 2017-03-20 DIAGNOSIS — Z79899 Other long term (current) drug therapy: Secondary | ICD-10-CM | POA: Diagnosis not present

## 2017-03-20 DIAGNOSIS — C3492 Malignant neoplasm of unspecified part of left bronchus or lung: Secondary | ICD-10-CM | POA: Diagnosis not present

## 2017-03-20 DIAGNOSIS — Z510 Encounter for antineoplastic radiation therapy: Secondary | ICD-10-CM | POA: Diagnosis not present

## 2017-03-20 DIAGNOSIS — Z87891 Personal history of nicotine dependence: Secondary | ICD-10-CM | POA: Diagnosis not present

## 2017-03-20 DIAGNOSIS — C3412 Malignant neoplasm of upper lobe, left bronchus or lung: Secondary | ICD-10-CM | POA: Diagnosis not present

## 2017-03-20 DIAGNOSIS — Z9889 Other specified postprocedural states: Secondary | ICD-10-CM | POA: Diagnosis not present

## 2017-03-23 ENCOUNTER — Ambulatory Visit (HOSPITAL_BASED_OUTPATIENT_CLINIC_OR_DEPARTMENT_OTHER): Payer: Medicare Other

## 2017-03-23 ENCOUNTER — Ambulatory Visit
Admission: RE | Admit: 2017-03-23 | Discharge: 2017-03-23 | Disposition: A | Discharge status: Home / Self Care | Payer: Medicare Other | Source: Ambulatory Visit | Attending: Radiation Oncology | Admitting: Radiation Oncology

## 2017-03-23 ENCOUNTER — Other Ambulatory Visit (HOSPITAL_BASED_OUTPATIENT_CLINIC_OR_DEPARTMENT_OTHER): Payer: Medicare Other

## 2017-03-23 VITALS — BP 127/65 | HR 64 | Temp 97.7°F

## 2017-03-23 DIAGNOSIS — Z87891 Personal history of nicotine dependence: Secondary | ICD-10-CM | POA: Diagnosis not present

## 2017-03-23 DIAGNOSIS — C3412 Malignant neoplasm of upper lobe, left bronchus or lung: Secondary | ICD-10-CM

## 2017-03-23 DIAGNOSIS — C771 Secondary and unspecified malignant neoplasm of intrathoracic lymph nodes: Secondary | ICD-10-CM

## 2017-03-23 DIAGNOSIS — Z881 Allergy status to other antibiotic agents status: Secondary | ICD-10-CM | POA: Diagnosis not present

## 2017-03-23 DIAGNOSIS — Z510 Encounter for antineoplastic radiation therapy: Secondary | ICD-10-CM | POA: Diagnosis not present

## 2017-03-23 DIAGNOSIS — C3492 Malignant neoplasm of unspecified part of left bronchus or lung: Secondary | ICD-10-CM

## 2017-03-23 DIAGNOSIS — Z5111 Encounter for antineoplastic chemotherapy: Secondary | ICD-10-CM | POA: Diagnosis present

## 2017-03-23 DIAGNOSIS — Z79899 Other long term (current) drug therapy: Secondary | ICD-10-CM | POA: Diagnosis not present

## 2017-03-23 DIAGNOSIS — Z9889 Other specified postprocedural states: Secondary | ICD-10-CM | POA: Diagnosis not present

## 2017-03-23 LAB — CBC WITH DIFFERENTIAL/PLATELET
BASO%: 0.4 % (ref 0.0–2.0)
BASOS ABS: 0 10*3/uL (ref 0.0–0.1)
EOS ABS: 0.1 10*3/uL (ref 0.0–0.5)
EOS%: 2.4 % (ref 0.0–7.0)
HEMATOCRIT: 38.6 % (ref 34.8–46.6)
HEMOGLOBIN: 12.3 g/dL (ref 11.6–15.9)
LYMPH%: 11 % — ABNORMAL LOW (ref 14.0–49.7)
MCH: 32.3 pg (ref 25.1–34.0)
MCHC: 31.9 g/dL (ref 31.5–36.0)
MCV: 101.3 fL — AB (ref 79.5–101.0)
MONO#: 0.5 10*3/uL (ref 0.1–0.9)
MONO%: 9.7 % (ref 0.0–14.0)
NEUT%: 76.5 % (ref 38.4–76.8)
NEUTROS ABS: 4.1 10*3/uL (ref 1.5–6.5)
PLATELETS: 182 10*3/uL (ref 145–400)
RBC: 3.81 10*6/uL (ref 3.70–5.45)
RDW: 13 % (ref 11.2–14.5)
WBC: 5.4 10*3/uL (ref 3.9–10.3)
lymph#: 0.6 10*3/uL — ABNORMAL LOW (ref 0.9–3.3)

## 2017-03-23 LAB — COMPREHENSIVE METABOLIC PANEL
ALBUMIN: 3.2 g/dL — AB (ref 3.5–5.0)
ALT: 17 U/L (ref 0–55)
ANION GAP: 6 meq/L (ref 3–11)
AST: 21 U/L (ref 5–34)
Alkaline Phosphatase: 97 U/L (ref 40–150)
BUN: 22.7 mg/dL (ref 7.0–26.0)
CALCIUM: 9.3 mg/dL (ref 8.4–10.4)
CHLORIDE: 106 meq/L (ref 98–109)
CO2: 31 meq/L — AB (ref 22–29)
CREATININE: 0.8 mg/dL (ref 0.6–1.1)
EGFR: >60 mL/min/{1.73_m2} (ref >60)
Glucose: 99 mg/dl (ref 70–140)
POTASSIUM: 4.9 meq/L (ref 3.5–5.1)
SODIUM: 142 meq/L (ref 136–145)
Total Bilirubin: <0.22 mg/dL (ref 0.20–1.20)
Total Protein: 7 g/dL (ref 6.4–8.3)

## 2017-03-23 MED ORDER — PALONOSETRON HCL INJECTION 0.25 MG/5ML
INTRAVENOUS | Status: AC
  Filled 2017-03-23: qty 5

## 2017-03-23 MED ORDER — SODIUM CHLORIDE 0.9 % IV SOLN
45.0000 mg/m2 | Freq: Once | INTRAVENOUS | Status: AC
  Administered 2017-03-23: 72 mg via INTRAVENOUS
  Filled 2017-03-23: qty 12

## 2017-03-23 MED ORDER — FAMOTIDINE IN NACL 20-0.9 MG/50ML-% IV SOLN
INTRAVENOUS | Status: AC
  Filled 2017-03-23: qty 50

## 2017-03-23 MED ORDER — SODIUM CHLORIDE 0.9 % IV SOLN
25.0000 mg | Freq: Once | INTRAVENOUS | Status: AC
  Administered 2017-03-23: 25 mg via INTRAVENOUS
  Filled 2017-03-23: qty 0.5

## 2017-03-23 MED ORDER — FAMOTIDINE IN NACL 20-0.9 MG/50ML-% IV SOLN
20.0000 mg | Freq: Once | INTRAVENOUS | Status: AC
  Administered 2017-03-23: 20 mg via INTRAVENOUS

## 2017-03-23 MED ORDER — SODIUM CHLORIDE 0.9 % IV SOLN
Freq: Once | INTRAVENOUS | Status: AC
  Administered 2017-03-23: 12:00:00 via INTRAVENOUS

## 2017-03-23 MED ORDER — PALONOSETRON HCL INJECTION 0.25 MG/5ML
0.2500 mg | Freq: Once | INTRAVENOUS | Status: AC
  Administered 2017-03-23: 0.25 mg via INTRAVENOUS

## 2017-03-23 MED ORDER — SODIUM CHLORIDE 0.9 % IV SOLN
20.0000 mg | Freq: Once | INTRAVENOUS | Status: AC
  Administered 2017-03-23: 20 mg via INTRAVENOUS
  Filled 2017-03-23: qty 2

## 2017-03-23 MED ORDER — CARBOPLATIN CHEMO INJECTION 450 MG/45ML
128.8000 mg | Freq: Once | INTRAVENOUS | Status: AC
  Administered 2017-03-23: 130 mg via INTRAVENOUS
  Filled 2017-03-23: qty 13

## 2017-03-23 NOTE — Patient Instructions (Signed)
[facility] Discharge Instructions for Patients Receiving Chemotherapy  Today you received the following chemotherapy agents taxol/carboplatin  To help prevent nausea and vomiting after your treatment, we encourage you to take your nausea medication as directed   If you develop nausea and vomiting that is not controlled by your nausea medication, call the clinic.   BELOW ARE SYMPTOMS THAT SHOULD BE REPORTED IMMEDIATELY:  *FEVER GREATER THAN 100.5 F  *CHILLS WITH OR WITHOUT FEVER  NAUSEA AND VOMITING THAT IS NOT CONTROLLED WITH YOUR NAUSEA MEDICATION  *UNUSUAL SHORTNESS OF BREATH  *UNUSUAL BRUISING OR BLEEDING  TENDERNESS IN MOUTH AND THROAT WITH OR WITHOUT PRESENCE OF ULCERS  *URINARY PROBLEMS  *BOWEL PROBLEMS  UNUSUAL RASH Items with * indicate a potential emergency and should be followed up as soon as possible.  Feel free to call the clinic you have any questions or concerns. The clinic phone number is [phone].  

## 2017-03-24 ENCOUNTER — Ambulatory Visit
Admission: RE | Admit: 2017-03-24 | Discharge: 2017-03-24 | Disposition: A | Discharge status: Home / Self Care | Payer: Medicare Other | Source: Ambulatory Visit | Attending: Radiation Oncology | Admitting: Radiation Oncology

## 2017-03-24 ENCOUNTER — Encounter: Payer: Self-pay | Admitting: Internal Medicine

## 2017-03-24 DIAGNOSIS — Z79899 Other long term (current) drug therapy: Secondary | ICD-10-CM | POA: Diagnosis not present

## 2017-03-24 DIAGNOSIS — C3412 Malignant neoplasm of upper lobe, left bronchus or lung: Secondary | ICD-10-CM | POA: Diagnosis not present

## 2017-03-24 DIAGNOSIS — Z881 Allergy status to other antibiotic agents status: Secondary | ICD-10-CM | POA: Diagnosis not present

## 2017-03-24 DIAGNOSIS — C3492 Malignant neoplasm of unspecified part of left bronchus or lung: Secondary | ICD-10-CM | POA: Diagnosis not present

## 2017-03-24 DIAGNOSIS — Z9889 Other specified postprocedural states: Secondary | ICD-10-CM | POA: Diagnosis not present

## 2017-03-24 DIAGNOSIS — Z87891 Personal history of nicotine dependence: Secondary | ICD-10-CM | POA: Diagnosis not present

## 2017-03-24 DIAGNOSIS — Z510 Encounter for antineoplastic radiation therapy: Secondary | ICD-10-CM | POA: Diagnosis not present

## 2017-03-25 ENCOUNTER — Ambulatory Visit
Admission: RE | Admit: 2017-03-25 | Discharge: 2017-03-25 | Disposition: A | Discharge status: Home / Self Care | Payer: Medicare Other | Source: Ambulatory Visit | Attending: Radiation Oncology | Admitting: Radiation Oncology

## 2017-03-25 ENCOUNTER — Encounter: Payer: Self-pay | Admitting: Internal Medicine

## 2017-03-25 DIAGNOSIS — C3492 Malignant neoplasm of unspecified part of left bronchus or lung: Secondary | ICD-10-CM | POA: Diagnosis not present

## 2017-03-25 DIAGNOSIS — Z881 Allergy status to other antibiotic agents status: Secondary | ICD-10-CM | POA: Diagnosis not present

## 2017-03-25 DIAGNOSIS — Z9889 Other specified postprocedural states: Secondary | ICD-10-CM | POA: Diagnosis not present

## 2017-03-25 DIAGNOSIS — Z510 Encounter for antineoplastic radiation therapy: Secondary | ICD-10-CM | POA: Diagnosis not present

## 2017-03-25 DIAGNOSIS — Z87891 Personal history of nicotine dependence: Secondary | ICD-10-CM | POA: Diagnosis not present

## 2017-03-25 DIAGNOSIS — C3412 Malignant neoplasm of upper lobe, left bronchus or lung: Secondary | ICD-10-CM | POA: Diagnosis not present

## 2017-03-25 DIAGNOSIS — Z79899 Other long term (current) drug therapy: Secondary | ICD-10-CM | POA: Diagnosis not present

## 2017-03-26 ENCOUNTER — Encounter: Payer: Self-pay | Admitting: Internal Medicine

## 2017-03-26 ENCOUNTER — Ambulatory Visit
Admission: RE | Admit: 2017-03-26 | Discharge: 2017-03-26 | Disposition: A | Discharge status: Home / Self Care | Payer: Medicare Other | Source: Ambulatory Visit | Attending: Radiation Oncology | Admitting: Radiation Oncology

## 2017-03-26 DIAGNOSIS — Z9889 Other specified postprocedural states: Secondary | ICD-10-CM | POA: Diagnosis not present

## 2017-03-26 DIAGNOSIS — C3412 Malignant neoplasm of upper lobe, left bronchus or lung: Secondary | ICD-10-CM | POA: Diagnosis not present

## 2017-03-26 DIAGNOSIS — Z87891 Personal history of nicotine dependence: Secondary | ICD-10-CM | POA: Diagnosis not present

## 2017-03-26 DIAGNOSIS — C3492 Malignant neoplasm of unspecified part of left bronchus or lung: Secondary | ICD-10-CM | POA: Diagnosis not present

## 2017-03-26 DIAGNOSIS — Z79899 Other long term (current) drug therapy: Secondary | ICD-10-CM | POA: Diagnosis not present

## 2017-03-26 DIAGNOSIS — Z881 Allergy status to other antibiotic agents status: Secondary | ICD-10-CM | POA: Diagnosis not present

## 2017-03-26 DIAGNOSIS — Z510 Encounter for antineoplastic radiation therapy: Secondary | ICD-10-CM | POA: Diagnosis not present

## 2017-03-27 ENCOUNTER — Ambulatory Visit
Admission: RE | Admit: 2017-03-27 | Discharge: 2017-03-27 | Disposition: A | Discharge status: Home / Self Care | Payer: Medicare Other | Source: Ambulatory Visit | Attending: Radiation Oncology | Admitting: Radiation Oncology

## 2017-03-27 DIAGNOSIS — C3492 Malignant neoplasm of unspecified part of left bronchus or lung: Secondary | ICD-10-CM | POA: Diagnosis not present

## 2017-03-27 DIAGNOSIS — Z9889 Other specified postprocedural states: Secondary | ICD-10-CM | POA: Diagnosis not present

## 2017-03-27 DIAGNOSIS — Z79899 Other long term (current) drug therapy: Secondary | ICD-10-CM | POA: Diagnosis not present

## 2017-03-27 DIAGNOSIS — Z87891 Personal history of nicotine dependence: Secondary | ICD-10-CM | POA: Diagnosis not present

## 2017-03-27 DIAGNOSIS — Z510 Encounter for antineoplastic radiation therapy: Secondary | ICD-10-CM | POA: Diagnosis not present

## 2017-03-27 DIAGNOSIS — Z881 Allergy status to other antibiotic agents status: Secondary | ICD-10-CM | POA: Diagnosis not present

## 2017-03-27 DIAGNOSIS — C3412 Malignant neoplasm of upper lobe, left bronchus or lung: Secondary | ICD-10-CM | POA: Diagnosis not present

## 2017-03-30 ENCOUNTER — Ambulatory Visit (HOSPITAL_BASED_OUTPATIENT_CLINIC_OR_DEPARTMENT_OTHER): Payer: Medicare Other | Admitting: Oncology

## 2017-03-30 ENCOUNTER — Ambulatory Visit (HOSPITAL_BASED_OUTPATIENT_CLINIC_OR_DEPARTMENT_OTHER): Payer: Medicare Other

## 2017-03-30 ENCOUNTER — Ambulatory Visit
Admission: RE | Admit: 2017-03-30 | Discharge: 2017-03-30 | Disposition: A | Discharge status: Home / Self Care | Payer: Medicare Other | Source: Ambulatory Visit | Attending: Radiation Oncology | Admitting: Radiation Oncology

## 2017-03-30 ENCOUNTER — Encounter: Payer: Self-pay | Admitting: Oncology

## 2017-03-30 ENCOUNTER — Other Ambulatory Visit (HOSPITAL_BASED_OUTPATIENT_CLINIC_OR_DEPARTMENT_OTHER): Payer: Medicare Other

## 2017-03-30 VITALS — BP 121/65 | HR 84 | Temp 97.7°F | Resp 18 | Ht 62.0 in | Wt 139.4 lb

## 2017-03-30 DIAGNOSIS — Z510 Encounter for antineoplastic radiation therapy: Secondary | ICD-10-CM | POA: Diagnosis not present

## 2017-03-30 DIAGNOSIS — Z5111 Encounter for antineoplastic chemotherapy: Secondary | ICD-10-CM | POA: Diagnosis present

## 2017-03-30 DIAGNOSIS — Z79899 Other long term (current) drug therapy: Secondary | ICD-10-CM | POA: Diagnosis not present

## 2017-03-30 DIAGNOSIS — R05 Cough: Secondary | ICD-10-CM | POA: Diagnosis not present

## 2017-03-30 DIAGNOSIS — C3492 Malignant neoplasm of unspecified part of left bronchus or lung: Secondary | ICD-10-CM

## 2017-03-30 DIAGNOSIS — C3412 Malignant neoplasm of upper lobe, left bronchus or lung: Secondary | ICD-10-CM | POA: Diagnosis not present

## 2017-03-30 DIAGNOSIS — Z881 Allergy status to other antibiotic agents status: Secondary | ICD-10-CM | POA: Diagnosis not present

## 2017-03-30 DIAGNOSIS — Z87891 Personal history of nicotine dependence: Secondary | ICD-10-CM | POA: Diagnosis not present

## 2017-03-30 DIAGNOSIS — Z9889 Other specified postprocedural states: Secondary | ICD-10-CM | POA: Diagnosis not present

## 2017-03-30 DIAGNOSIS — R0609 Other forms of dyspnea: Secondary | ICD-10-CM

## 2017-03-30 LAB — CBC WITH DIFFERENTIAL/PLATELET
BASO%: 0.8 % (ref 0.0–2.0)
BASOS ABS: 0 10*3/uL (ref 0.0–0.1)
EOS%: 2.2 % (ref 0.0–7.0)
Eosinophils Absolute: 0.1 10*3/uL (ref 0.0–0.5)
HEMATOCRIT: 38 % (ref 34.8–46.6)
HGB: 12.4 g/dL (ref 11.6–15.9)
LYMPH#: 0.5 10*3/uL — AB (ref 0.9–3.3)
LYMPH%: 11.5 % — AB (ref 14.0–49.7)
MCH: 32.3 pg (ref 25.1–34.0)
MCHC: 32.6 g/dL (ref 31.5–36.0)
MCV: 99.2 fL (ref 79.5–101.0)
MONO#: 0.4 10*3/uL (ref 0.1–0.9)
MONO%: 8.9 % (ref 0.0–14.0)
NEUT#: 3.1 10*3/uL (ref 1.5–6.5)
NEUT%: 76.6 % (ref 38.4–76.8)
Platelets: 170 10*3/uL (ref 145–400)
RBC: 3.83 10*6/uL (ref 3.70–5.45)
RDW: 13.6 % (ref 11.2–14.5)
WBC: 4 10*3/uL (ref 3.9–10.3)

## 2017-03-30 LAB — COMPREHENSIVE METABOLIC PANEL
ALT: 18 U/L (ref 0–55)
AST: 22 U/L (ref 5–34)
Albumin: 3.4 g/dL — ABNORMAL LOW (ref 3.5–5.0)
Alkaline Phosphatase: 81 U/L (ref 40–150)
Anion Gap: 8 mEq/L (ref 3–11)
BUN: 25.7 mg/dL (ref 7.0–26.0)
CHLORIDE: 106 meq/L (ref 98–109)
CO2: 29 meq/L (ref 22–29)
Calcium: 9.7 mg/dL (ref 8.4–10.4)
Creatinine: 0.9 mg/dL (ref 0.6–1.1)
EGFR: 58 mL/min/{1.73_m2} — ABNORMAL LOW (ref >60)
Glucose: 125 mg/dl (ref 70–140)
POTASSIUM: 4.4 meq/L (ref 3.5–5.1)
Sodium: 143 mEq/L (ref 136–145)
Total Bilirubin: 0.32 mg/dL (ref 0.20–1.20)
Total Protein: 7.2 g/dL (ref 6.4–8.3)

## 2017-03-30 MED ORDER — CARBOPLATIN CHEMO INJECTION 450 MG/45ML
128.8000 mg | Freq: Once | INTRAVENOUS | Status: AC
  Administered 2017-03-30: 130 mg via INTRAVENOUS
  Filled 2017-03-30: qty 13

## 2017-03-30 MED ORDER — DIPHENHYDRAMINE HCL 50 MG/ML IJ SOLN
INTRAMUSCULAR | Status: AC
  Filled 2017-03-30: qty 1

## 2017-03-30 MED ORDER — DIPHENHYDRAMINE HCL 50 MG/ML IJ SOLN
25.0000 mg | Freq: Once | INTRAMUSCULAR | Status: AC
  Administered 2017-03-30: 25 mg via INTRAVENOUS
  Filled 2017-03-30: qty 0.5

## 2017-03-30 MED ORDER — PACLITAXEL CHEMO INJECTION 300 MG/50ML
45.0000 mg/m2 | Freq: Once | INTRAVENOUS | Status: AC
  Administered 2017-03-30: 72 mg via INTRAVENOUS
  Filled 2017-03-30: qty 12

## 2017-03-30 MED ORDER — FAMOTIDINE IN NACL 20-0.9 MG/50ML-% IV SOLN
20.0000 mg | Freq: Once | INTRAVENOUS | Status: AC
  Administered 2017-03-30: 20 mg via INTRAVENOUS

## 2017-03-30 MED ORDER — SODIUM CHLORIDE 0.9 % IV SOLN
Freq: Once | INTRAVENOUS | Status: AC
  Administered 2017-03-30: 12:00:00 via INTRAVENOUS

## 2017-03-30 MED ORDER — PALONOSETRON HCL INJECTION 0.25 MG/5ML
0.2500 mg | Freq: Once | INTRAVENOUS | Status: AC
  Administered 2017-03-30: 0.25 mg via INTRAVENOUS

## 2017-03-30 MED ORDER — SODIUM CHLORIDE 0.9 % IV SOLN
20.0000 mg | Freq: Once | INTRAVENOUS | Status: AC
  Administered 2017-03-30: 20 mg via INTRAVENOUS
  Filled 2017-03-30: qty 2

## 2017-03-30 MED ORDER — PALONOSETRON HCL INJECTION 0.25 MG/5ML
INTRAVENOUS | Status: AC
  Filled 2017-03-30: qty 5

## 2017-03-30 NOTE — Patient Instructions (Signed)
[facility] Discharge Instructions for Patients Receiving Chemotherapy  Today you received the following chemotherapy agents: Taxol and Carboplatin.  To help prevent nausea and vomiting after your treatment, we encourage you to take your nausea medication: Compazine. Take one every 6 hours as needed. If you develop nausea and vomiting that is not controlled by your nausea medication, call the clinic.   BELOW ARE SYMPTOMS THAT SHOULD BE REPORTED IMMEDIATELY:  *FEVER GREATER THAN 100.5 F  *CHILLS WITH OR WITHOUT FEVER  NAUSEA AND VOMITING THAT IS NOT CONTROLLED WITH YOUR NAUSEA MEDICATION  *UNUSUAL SHORTNESS OF BREATH  *UNUSUAL BRUISING OR BLEEDING  TENDERNESS IN MOUTH AND THROAT WITH OR WITHOUT PRESENCE OF ULCERS  *URINARY PROBLEMS  *BOWEL PROBLEMS  UNUSUAL RASH Items with * indicate a potential emergency and should be followed up as soon as possible.  Feel free to call the clinic should you have any questions or concerns. The clinic phone number is [phone].  Please show the [place] at check-in to the Emergency Department and triage nurse.

## 2017-03-30 NOTE — Progress Notes (Signed)
[facility] OFFICE PROGRESS NOTE  [name], MD [place] [address]  DIAGNOSIS: Stage IIA (T2a, N1, M0) non-small cell lung cancer, squamous cell carcinoma presented with lingual mass as well as left hilar adenopathy diagnosed in September 2018.  PRIOR THERAPY: None  CURRENT THERAPY: A course of concurrent chemoradiation with weekly carboplatin for AUC of 2 and paclitaxel 45 MG/M2. Status post 3 cycles.   INTERVAL HISTORY: Rebecca [name] 83 y.o. female returns for a routine follow-up accompanied by her son. The patient is feeling fine today with no specific complaints except for dry cough. She tolerated the first week of her treatment with concurrent chemoradiation fairly well. She denied having any chest pain but has shortness of breath at baseline and increased with exertion and she is currently on home oxygen. She denied having any hemoptysis. She denied having any weight loss or night sweats. She has no nausea, vomiting, diarrhea or constipation. She today for evaluation before starting cycle #4.  MEDICAL HISTORY: Past Medical History:  Diagnosis Date  . Anemia, unspecified 03/18/2011  . Anxiety state, unspecified 01/2011  . Blood in stool 06/15/2012  . Corns and callosities 07/01/2011  . Dementia   . Diverticulosis 02/2011  . Dizziness   . DJD (degenerative joint disease)   . Dysuria 06/17/2011  . Hemorrhoids 02/2011  . Low back pain   . Lung cancer ([place])    left  . Mitral valve problem thickened   thickened  . Osteoarthritis of both hands 10/09/2015  . Osteoporosis 02/2011  . Other abnormal blood chemistry 0/30/2012  . Other acquired deformity of toe 12/16/2011  . Pain in joint, site unspecified 01/2012  . 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 Sacroiliitis, not elsewhere classified ([place]) 05/2011  . Seizures ([place])    Remotely  . Unspecified hypothyroidism 01/2011  . 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 Vitamin D deficiency 01/2011    ALLERGIES:  is allergic to macrodantin [nitrofurantoin]; morphine and related; and sulfa antibiotics.  MEDICATIONS:  Current Outpatient Medications  Medication Sig Dispense Refill  . acetaminophen (TYLENOL) 325 MG tablet Take 650 mg by mouth 2 (two) times daily.    . Calcium Carbonate-Vitamin D (CALCIUM 600+D) 600-400 MG-UNIT tablet Take 1 tablet by mouth daily.    . Cholecalciferol (VITAMIN D3) 5000 units TABS Take 1 tablet by mouth daily. Take one tablet daily     . clonazePAM (KLONOPIN) 0.5 MG tablet Take 1 tablet (0.5 mg total) by mouth as directed. Take one tablet every morning scheduled and then at bedtime as needed. 15 tablet 0  . donepezil (ARICEPT) 10 MG tablet Take 10 mg by mouth. Take one tablet in evening for memory    . DULoxetine (CYMBALTA) 30 MG capsule Take 30 mg by mouth daily.     Marland Kitchen levothyroxine (SYNTHROID, LEVOTHROID) 100 MCG tablet Take 1 tablet (100 mcg total) by mouth daily before breakfast. For thyroid 90 tablet 3  . meloxicam (MOBIC) 7.5 MG tablet Take 7.5 mg by mouth daily as needed for pain.     . memantine (NAMENDA) 10 MG tablet Take 10 mg by mouth 2 (two) times daily.    . Multiple Vitamins-Minerals (THERA M PLUS PO) Take 1 tablet by mouth daily.     . Omega-3 1000 MG CAPS Take 1 capsule by mouth daily. Take one capsule every morning     . omeprazole (PRILOSEC) 20 MG capsule Take 20  mg by mouth at bedtime.    . OXYGEN Inhale 2 L/min into the lungs as needed. To keep O2 Sat >90%    . phenytoin (DILANTIN) 100 MG ER capsule Take 100-200 mg by mouth See admin instructions. Take one capsule every morning; take 2 capsules at bedtime     . prochlorperazine (COMPAZINE) 10 MG tablet Take 1 tablet (10 mg total) by mouth every 6 (six) hours as needed for nausea or vomiting. 30 tablet 0  . QUEtiapine (SEROQUEL) 25 MG tablet Take 12.5 mg by mouth daily. At 8 pm    . triamcinolone cream (KENALOG) 0.1 % Apply 1  application topically daily as needed (for rash).     . trolamine salicylate (ASPERCREME) 10 % cream Apply 1 application topically. Apply as needed    . umeclidinium-vilanterol (ANORO ELLIPTA) 62.5-25 MCG/INH AEPB Inhale 1 puff into the lungs daily. 1 each 6   No current facility-administered medications for this visit.    Facility-Administered Medications Ordered in Other Visits  Medication Dose Route Frequency Provider Last Rate Last Dose  . CARBOplatin (PARAPLATIN) 130 mg in sodium chloride 0.9 % 100 mL chemo infusion  130 mg Intravenous Once Curt Bears, MD      . dexamethasone (DECADRON) 20 mg in sodium chloride 0.9 % 50 mL IVPB  20 mg Intravenous Once Curt Bears, MD   20 mg at 03/30/17 1223  . PACLitaxel (TAXOL) 72 mg in sodium chloride 0.9 % 250 mL chemo infusion (</= 80mg /m2)  45 mg/m2 (Treatment Plan Recorded) Intravenous Once Curt Bears, MD        SURGICAL HISTORY:  Past Surgical History:  Procedure Laterality Date  . bletheroplasty    . CATARACT EXTRACTION W/ INTRAOCULAR LENS  IMPLANT, BILATERAL  2010  . CERVICAL LAMINECTOMY  2005  . HAMMER TOE SURGERY  2013   right 2nd toe Dr. Mallie Mussel  . IVC FILTER PLACEMENT ([facility] HX)    . JOINT REPLACEMENT Bilateral 2002 Right, 1992 Left   knees    REVIEW OF SYSTEMS:   Review of Systems  Constitutional: Negative for appetite change, chills, fatigue, fever and unexpected weight change.  HENT:   Negative for mouth sores, nosebleeds, sore throat and trouble swallowing.   Eyes: Negative for eye problems and icterus.  Respiratory: Negative for hemoptysis, shortness of breath and wheezing. Positive for cough.  Cardiovascular: Negative for chest pain and leg swelling.  Gastrointestinal: Negative for abdominal pain, constipation, diarrhea, nausea and vomiting.  Genitourinary: Negative for bladder incontinence, difficulty urinating, dysuria, frequency and hematuria.   Musculoskeletal: Negative for back pain, gait problem, neck  pain and neck stiffness.  Skin: Negative for itching and rash.  Neurological: Negative for dizziness, extremity weakness, gait problem, headaches, light-headedness and seizures.  Hematological: Negative for adenopathy. Does not bruise/bleed easily.  Psychiatric/Behavioral: Negative for confusion, depression and sleep disturbance. The patient is not nervous/anxious.     PHYSICAL EXAMINATION:  Blood pressure 121/65, pulse 84, temperature 97.7 F (36.5 C), temperature source Oral, resp. rate 18, height 5\' 2"  (1.575 m), weight 139 lb 6.4 oz (63.2 kg), SpO2 96 %.  ECOG PERFORMANCE STATUS: 1 - Symptomatic but completely ambulatory  Physical Exam  Constitutional: Oriented to person, place, and time and well-developed, well-nourished, and in no distress. No distress.  HENT:  Head: Normocephalic and atraumatic.  Mouth/Throat: Oropharynx is clear and moist. No oropharyngeal exudate.  Eyes: Conjunctivae are normal. Right eye exhibits no discharge. Left eye exhibits no discharge. No scleral icterus.  Neck: Normal range of  motion. Neck supple.  Cardiovascular: Normal rate, regular rhythm, normal heart sounds and intact distal pulses.   Pulmonary/Chest: Effort normal and breath sounds normal. No respiratory distress. No wheezes. No rales.  Abdominal: Soft. Bowel sounds are normal. Exhibits no distension and no mass. There is no tenderness.  Musculoskeletal: Normal range of motion. Exhibits no edema.  Lymphadenopathy:    No cervical adenopathy.  Neurological: Alert and oriented to person, place, and time. Exhibits normal muscle tone. Gait normal. Coordination normal.  Skin: Skin is warm and dry. No rash noted. Not diaphoretic. No erythema. No pallor.  Psychiatric: Mood, memory and judgment normal.  Vitals reviewed.  LABORATORY DATA: Lab Results  Component Value Date   WBC 4.0 03/30/2017   HGB 12.4 03/30/2017   HCT 38.0 03/30/2017   MCV 99.2 03/30/2017   PLT 170 03/30/2017      Chemistry       Component Value Date/Time   NA 143 03/30/2017 0926   K 4.4 03/30/2017 0926   CL 103 11/17/2016 0454   CO2 29 03/30/2017 0926   BUN 25.7 03/30/2017 0926   CREATININE 0.9 03/30/2017 0926   GLU 105 11/20/2016      Component Value Date/Time   CALCIUM 9.7 03/30/2017 0926   ALKPHOS 81 03/30/2017 0926   AST 22 03/30/2017 0926   ALT 18 03/30/2017 0926   BILITOT 0.32 03/30/2017 0926       RADIOGRAPHIC STUDIES:  No results found.   ASSESSMENT/PLAN:  Stage II squamous cell carcinoma of left lung (HCC)  this is a very pleasant 83 year old white female with unresectable stage IIA non-small cell lung cancer, squamous cell carcinoma. The patient is currently undergoing a course of concurrent chemoradiation with weekly carboplatin and paclitaxel status post 3 cycles and tolerating treatment fairly well. I recommended for the patient to proceed with cycle #4 today as a scheduled. Follow-up visit will be in 2 weeks for evaluation before starting cycle #6. The patient was advised to call immediately if she has any concerning symptoms in the interval.  The patient voices understanding of current disease status and treatment options and is in agreement with the current care plan.  All questions were answered. The patient knows to call the clinic with any problems, questions or concerns. We can certainly see the patient much sooner if necessary.  No orders of the defined types were placed in this encounter.  Mikey Bussing, DNP, AGPCNP-BC, AOCNP 03/30/17

## 2017-03-30 NOTE — Assessment & Plan Note (Signed)
this is a very pleasant 83 year old white female with unresectable stage IIA non-small cell lung cancer, squamous cell carcinoma. The patient is currently undergoing a course of concurrent chemoradiation with weekly carboplatin and paclitaxel status post 3 cycles and tolerating treatment fairly well. I recommended for the patient to proceed with cycle #4 today as a scheduled. Follow-up visit will be in 2 weeks for evaluation before starting cycle #6. The patient was advised to call immediately if she has any concerning symptoms in the interval.  The patient voices understanding of current disease status and treatment options and is in agreement with the current care plan.  All questions were answered. The patient knows to call the clinic with any problems, questions or concerns. We can certainly see the patient much sooner if necessary.

## 2017-03-31 ENCOUNTER — Ambulatory Visit
Admission: RE | Admit: 2017-03-31 | Discharge: 2017-03-31 | Disposition: A | Discharge status: Home / Self Care | Payer: Medicare Other | Source: Ambulatory Visit | Attending: Radiation Oncology | Admitting: Radiation Oncology

## 2017-03-31 DIAGNOSIS — Z79899 Other long term (current) drug therapy: Secondary | ICD-10-CM | POA: Diagnosis not present

## 2017-03-31 DIAGNOSIS — Z881 Allergy status to other antibiotic agents status: Secondary | ICD-10-CM | POA: Diagnosis not present

## 2017-03-31 DIAGNOSIS — Z9889 Other specified postprocedural states: Secondary | ICD-10-CM | POA: Diagnosis not present

## 2017-03-31 DIAGNOSIS — C3412 Malignant neoplasm of upper lobe, left bronchus or lung: Secondary | ICD-10-CM | POA: Diagnosis not present

## 2017-03-31 DIAGNOSIS — Z510 Encounter for antineoplastic radiation therapy: Secondary | ICD-10-CM | POA: Diagnosis not present

## 2017-03-31 DIAGNOSIS — Z87891 Personal history of nicotine dependence: Secondary | ICD-10-CM | POA: Diagnosis not present

## 2017-03-31 DIAGNOSIS — C3492 Malignant neoplasm of unspecified part of left bronchus or lung: Secondary | ICD-10-CM | POA: Diagnosis not present

## 2017-04-01 ENCOUNTER — Ambulatory Visit
Admission: RE | Admit: 2017-04-01 | Discharge: 2017-04-01 | Disposition: A | Discharge status: Home / Self Care | Payer: Medicare Other | Source: Ambulatory Visit | Attending: Radiation Oncology | Admitting: Radiation Oncology

## 2017-04-01 DIAGNOSIS — C3412 Malignant neoplasm of upper lobe, left bronchus or lung: Secondary | ICD-10-CM | POA: Diagnosis not present

## 2017-04-01 DIAGNOSIS — Z79899 Other long term (current) drug therapy: Secondary | ICD-10-CM | POA: Diagnosis not present

## 2017-04-01 DIAGNOSIS — Z87891 Personal history of nicotine dependence: Secondary | ICD-10-CM | POA: Diagnosis not present

## 2017-04-01 DIAGNOSIS — Z510 Encounter for antineoplastic radiation therapy: Secondary | ICD-10-CM | POA: Diagnosis not present

## 2017-04-01 DIAGNOSIS — C3492 Malignant neoplasm of unspecified part of left bronchus or lung: Secondary | ICD-10-CM | POA: Diagnosis not present

## 2017-04-01 DIAGNOSIS — Z881 Allergy status to other antibiotic agents status: Secondary | ICD-10-CM | POA: Diagnosis not present

## 2017-04-01 DIAGNOSIS — Z9889 Other specified postprocedural states: Secondary | ICD-10-CM | POA: Diagnosis not present

## 2017-04-02 ENCOUNTER — Ambulatory Visit
Admission: RE | Admit: 2017-04-02 | Discharge: 2017-04-02 | Disposition: A | Discharge status: Home / Self Care | Payer: Medicare Other | Source: Ambulatory Visit | Attending: Radiation Oncology | Admitting: Radiation Oncology

## 2017-04-02 DIAGNOSIS — Z510 Encounter for antineoplastic radiation therapy: Secondary | ICD-10-CM | POA: Diagnosis not present

## 2017-04-02 DIAGNOSIS — Z87891 Personal history of nicotine dependence: Secondary | ICD-10-CM | POA: Diagnosis not present

## 2017-04-02 DIAGNOSIS — Z881 Allergy status to other antibiotic agents status: Secondary | ICD-10-CM | POA: Diagnosis not present

## 2017-04-02 DIAGNOSIS — C3492 Malignant neoplasm of unspecified part of left bronchus or lung: Secondary | ICD-10-CM | POA: Diagnosis not present

## 2017-04-02 DIAGNOSIS — Z79899 Other long term (current) drug therapy: Secondary | ICD-10-CM | POA: Diagnosis not present

## 2017-04-02 DIAGNOSIS — Z9889 Other specified postprocedural states: Secondary | ICD-10-CM | POA: Diagnosis not present

## 2017-04-02 DIAGNOSIS — C3412 Malignant neoplasm of upper lobe, left bronchus or lung: Secondary | ICD-10-CM | POA: Diagnosis not present

## 2017-04-03 ENCOUNTER — Ambulatory Visit
Admission: RE | Admit: 2017-04-03 | Discharge: 2017-04-03 | Disposition: A | Discharge status: Home / Self Care | Payer: Medicare Other | Source: Ambulatory Visit | Attending: Radiation Oncology | Admitting: Radiation Oncology

## 2017-04-03 DIAGNOSIS — Z9889 Other specified postprocedural states: Secondary | ICD-10-CM | POA: Diagnosis not present

## 2017-04-03 DIAGNOSIS — C3412 Malignant neoplasm of upper lobe, left bronchus or lung: Secondary | ICD-10-CM | POA: Diagnosis not present

## 2017-04-03 DIAGNOSIS — C3492 Malignant neoplasm of unspecified part of left bronchus or lung: Secondary | ICD-10-CM | POA: Diagnosis not present

## 2017-04-03 DIAGNOSIS — Z87891 Personal history of nicotine dependence: Secondary | ICD-10-CM | POA: Diagnosis not present

## 2017-04-03 DIAGNOSIS — Z881 Allergy status to other antibiotic agents status: Secondary | ICD-10-CM | POA: Diagnosis not present

## 2017-04-03 DIAGNOSIS — Z79899 Other long term (current) drug therapy: Secondary | ICD-10-CM | POA: Diagnosis not present

## 2017-04-03 DIAGNOSIS — Z510 Encounter for antineoplastic radiation therapy: Secondary | ICD-10-CM | POA: Diagnosis not present

## 2017-04-06 ENCOUNTER — Ambulatory Visit: Payer: Medicare Other | Admitting: Nutrition

## 2017-04-06 ENCOUNTER — Ambulatory Visit
Admission: RE | Admit: 2017-04-06 | Discharge: 2017-04-06 | Disposition: A | Discharge status: Home / Self Care | Payer: Medicare Other | Source: Ambulatory Visit | Attending: Radiation Oncology | Admitting: Radiation Oncology

## 2017-04-06 ENCOUNTER — Other Ambulatory Visit (HOSPITAL_BASED_OUTPATIENT_CLINIC_OR_DEPARTMENT_OTHER): Payer: Medicare Other

## 2017-04-06 ENCOUNTER — Ambulatory Visit (HOSPITAL_BASED_OUTPATIENT_CLINIC_OR_DEPARTMENT_OTHER): Payer: Medicare Other

## 2017-04-06 VITALS — BP 114/72 | HR 74 | Temp 98.6°F | Resp 18

## 2017-04-06 DIAGNOSIS — C771 Secondary and unspecified malignant neoplasm of intrathoracic lymph nodes: Secondary | ICD-10-CM

## 2017-04-06 DIAGNOSIS — C3412 Malignant neoplasm of upper lobe, left bronchus or lung: Secondary | ICD-10-CM | POA: Diagnosis not present

## 2017-04-06 DIAGNOSIS — Z5111 Encounter for antineoplastic chemotherapy: Secondary | ICD-10-CM | POA: Diagnosis present

## 2017-04-06 DIAGNOSIS — C3492 Malignant neoplasm of unspecified part of left bronchus or lung: Secondary | ICD-10-CM

## 2017-04-06 LAB — COMPREHENSIVE METABOLIC PANEL
ALT: 24 U/L (ref 0–55)
ANION GAP: 8 meq/L (ref 3–11)
AST: 23 U/L (ref 5–34)
Albumin: 3.2 g/dL — ABNORMAL LOW (ref 3.5–5.0)
Alkaline Phosphatase: 72 U/L (ref 40–150)
BILIRUBIN TOTAL: 0.25 mg/dL (ref 0.20–1.20)
BUN: 29.8 mg/dL — ABNORMAL HIGH (ref 7.0–26.0)
CALCIUM: 9.6 mg/dL (ref 8.4–10.4)
CHLORIDE: 105 meq/L (ref 98–109)
CO2: 28 mEq/L (ref 22–29)
CREATININE: 0.9 mg/dL (ref 0.6–1.1)
EGFR: 59 mL/min/{1.73_m2} — AB (ref >60)
Glucose: 91 mg/dl (ref 70–140)
Potassium: 4.4 mEq/L (ref 3.5–5.1)
Sodium: 142 mEq/L (ref 136–145)
Total Protein: 7.1 g/dL (ref 6.4–8.3)

## 2017-04-06 LAB — CBC WITH DIFFERENTIAL/PLATELET
BASO%: 0.4 % (ref 0.0–2.0)
BASOS ABS: 0 10*3/uL (ref 0.0–0.1)
EOS ABS: 0.1 10*3/uL (ref 0.0–0.5)
EOS%: 1.5 % (ref 0.0–7.0)
HEMATOCRIT: 35.1 % (ref 34.8–46.6)
HGB: 11.6 g/dL (ref 11.6–15.9)
LYMPH#: 0.3 10*3/uL — AB (ref 0.9–3.3)
LYMPH%: 6.2 % — AB (ref 14.0–49.7)
MCH: 33.1 pg (ref 25.1–34.0)
MCHC: 33.2 g/dL (ref 31.5–36.0)
MCV: 99.7 fL (ref 79.5–101.0)
MONO#: 0.5 10*3/uL (ref 0.1–0.9)
MONO%: 8.2 % (ref 0.0–14.0)
NEUT#: 4.6 10*3/uL (ref 1.5–6.5)
NEUT%: 83.7 % — AB (ref 38.4–76.8)
PLATELETS: 110 10*3/uL — AB (ref 145–400)
RBC: 3.52 10*6/uL — AB (ref 3.70–5.45)
RDW: 13.9 % (ref 11.2–14.5)
WBC: 5.5 10*3/uL (ref 3.9–10.3)

## 2017-04-06 MED ORDER — PALONOSETRON HCL INJECTION 0.25 MG/5ML
0.2500 mg | Freq: Once | INTRAVENOUS | Status: AC
  Administered 2017-04-06: 0.25 mg via INTRAVENOUS

## 2017-04-06 MED ORDER — DIPHENHYDRAMINE HCL 50 MG/ML IJ SOLN
INTRAMUSCULAR | Status: AC
  Filled 2017-04-06: qty 1

## 2017-04-06 MED ORDER — PALONOSETRON HCL INJECTION 0.25 MG/5ML
INTRAVENOUS | Status: AC
  Filled 2017-04-06: qty 5

## 2017-04-06 MED ORDER — SODIUM CHLORIDE 0.9 % IV SOLN
25.0000 mg | Freq: Once | INTRAVENOUS | Status: AC
  Administered 2017-04-06: 25 mg via INTRAVENOUS
  Filled 2017-04-06: qty 0.5

## 2017-04-06 MED ORDER — SODIUM CHLORIDE 0.9 % IV SOLN
128.8000 mg | Freq: Once | INTRAVENOUS | Status: AC
  Administered 2017-04-06: 130 mg via INTRAVENOUS
  Filled 2017-04-06: qty 13

## 2017-04-06 MED ORDER — SODIUM CHLORIDE 0.9 % IV SOLN
Freq: Once | INTRAVENOUS | Status: AC
  Administered 2017-04-06: 12:00:00 via INTRAVENOUS

## 2017-04-06 MED ORDER — FAMOTIDINE IN NACL 20-0.9 MG/50ML-% IV SOLN
20.0000 mg | Freq: Once | INTRAVENOUS | Status: AC
  Administered 2017-04-06: 20 mg via INTRAVENOUS

## 2017-04-06 MED ORDER — SODIUM CHLORIDE 0.9 % IV SOLN
20.0000 mg | Freq: Once | INTRAVENOUS | Status: AC
  Administered 2017-04-06: 20 mg via INTRAVENOUS
  Filled 2017-04-06: qty 2

## 2017-04-06 MED ORDER — FAMOTIDINE IN NACL 20-0.9 MG/50ML-% IV SOLN
INTRAVENOUS | Status: AC
  Filled 2017-04-06: qty 50

## 2017-04-06 MED ORDER — SODIUM CHLORIDE 0.9 % IV SOLN
45.0000 mg/m2 | Freq: Once | INTRAVENOUS | Status: AC
  Administered 2017-04-06: 72 mg via INTRAVENOUS
  Filled 2017-04-06: qty 12

## 2017-04-06 NOTE — Patient Instructions (Signed)
[facility] Discharge Instructions for Patients Receiving Chemotherapy  Today you received the following chemotherapy agents:  Taxol, Carbolplatin  To help prevent nausea and vomiting after your treatment, we encourage you to take your nausea medication as directed.   If you develop nausea and vomiting that is not controlled by your nausea medication, call the clinic.   BELOW ARE SYMPTOMS THAT SHOULD BE REPORTED IMMEDIATELY:  *FEVER GREATER THAN 100.5 F  *CHILLS WITH OR WITHOUT FEVER  NAUSEA AND VOMITING THAT IS NOT CONTROLLED WITH YOUR NAUSEA MEDICATION  *UNUSUAL SHORTNESS OF BREATH  *UNUSUAL BRUISING OR BLEEDING  TENDERNESS IN MOUTH AND THROAT WITH OR WITHOUT PRESENCE OF ULCERS  *URINARY PROBLEMS  *BOWEL PROBLEMS  UNUSUAL RASH Items with * indicate a potential emergency and should be followed up as soon as possible.  Feel free to call the clinic should you have any questions or concerns. The clinic phone number is [phone].  Please show the [place] at check-in to the Emergency Department and triage nurse.

## 2017-04-06 NOTE — Progress Notes (Signed)
Attempted nutrition follow-up with patient during chemotherapy for lung cancer. [facility]. Patient is confused and her family is not with her.  She is unable to answer questions. Weight noted to be 139.4 pounds November 12 which is increased from 137.1 pounds. Patient was eating sandwiches, chips. Patient sees a dietitian at [facility].   Family has my contact information if further interventions are needed.  **Disclaimer: This note was dictated with voice recognition software. Similar sounding words can inadvertently be transcribed and this note may contain transcription errors which may not have been corrected upon publication of note.**

## 2017-04-07 ENCOUNTER — Ambulatory Visit
Admission: RE | Admit: 2017-04-07 | Discharge: 2017-04-07 | Disposition: A | Discharge status: Home / Self Care | Payer: Medicare Other | Source: Ambulatory Visit | Attending: Radiation Oncology | Admitting: Radiation Oncology

## 2017-04-07 DIAGNOSIS — C3412 Malignant neoplasm of upper lobe, left bronchus or lung: Secondary | ICD-10-CM | POA: Diagnosis not present

## 2017-04-08 ENCOUNTER — Emergency Department (HOSPITAL_COMMUNITY): Payer: Medicare Other

## 2017-04-08 ENCOUNTER — Encounter (HOSPITAL_COMMUNITY): Payer: Self-pay | Admitting: Family Medicine

## 2017-04-08 ENCOUNTER — Other Ambulatory Visit: Payer: Self-pay

## 2017-04-08 ENCOUNTER — Telehealth: Payer: Self-pay | Admitting: *Deleted

## 2017-04-08 ENCOUNTER — Inpatient Hospital Stay (HOSPITAL_COMMUNITY)
Admission: EM | Admit: 2017-04-08 | Discharge: 2017-04-13 | DRG: 315 | Disposition: A | Discharge status: Skilled Nursing Facility | Payer: Medicare Other | Attending: Internal Medicine | Admitting: Internal Medicine

## 2017-04-08 ENCOUNTER — Ambulatory Visit
Admission: RE | Admit: 2017-04-08 | Discharge: 2017-04-08 | Disposition: A | Discharge status: Home / Self Care | Payer: Medicare Other | Source: Ambulatory Visit | Attending: Radiation Oncology | Admitting: Radiation Oncology

## 2017-04-08 DIAGNOSIS — I959 Hypotension, unspecified: Principal | ICD-10-CM | POA: Diagnosis present

## 2017-04-08 DIAGNOSIS — J849 Interstitial pulmonary disease, unspecified: Secondary | ICD-10-CM | POA: Diagnosis present

## 2017-04-08 DIAGNOSIS — G40909 Epilepsy, unspecified, not intractable, without status epilepticus: Secondary | ICD-10-CM | POA: Diagnosis not present

## 2017-04-08 DIAGNOSIS — C3490 Malignant neoplasm of unspecified part of unspecified bronchus or lung: Secondary | ICD-10-CM | POA: Diagnosis not present

## 2017-04-08 DIAGNOSIS — J8410 Pulmonary fibrosis, unspecified: Secondary | ICD-10-CM | POA: Diagnosis not present

## 2017-04-08 DIAGNOSIS — Z66 Do not resuscitate: Secondary | ICD-10-CM | POA: Diagnosis present

## 2017-04-08 DIAGNOSIS — Z961 Presence of intraocular lens: Secondary | ICD-10-CM | POA: Diagnosis present

## 2017-04-08 DIAGNOSIS — R079 Chest pain, unspecified: Secondary | ICD-10-CM | POA: Diagnosis not present

## 2017-04-08 DIAGNOSIS — J9611 Chronic respiratory failure with hypoxia: Secondary | ICD-10-CM | POA: Diagnosis not present

## 2017-04-08 DIAGNOSIS — D6959 Other secondary thrombocytopenia: Secondary | ICD-10-CM | POA: Diagnosis present

## 2017-04-08 DIAGNOSIS — F03918 Unspecified dementia, unspecified severity, with other behavioral disturbance: Secondary | ICD-10-CM | POA: Diagnosis present

## 2017-04-08 DIAGNOSIS — Z96653 Presence of artificial knee joint, bilateral: Secondary | ICD-10-CM | POA: Diagnosis present

## 2017-04-08 DIAGNOSIS — T451X5A Adverse effect of antineoplastic and immunosuppressive drugs, initial encounter: Secondary | ICD-10-CM | POA: Diagnosis present

## 2017-04-08 DIAGNOSIS — I10 Essential (primary) hypertension: Secondary | ICD-10-CM | POA: Diagnosis not present

## 2017-04-08 DIAGNOSIS — F411 Generalized anxiety disorder: Secondary | ICD-10-CM | POA: Diagnosis present

## 2017-04-08 DIAGNOSIS — D72829 Elevated white blood cell count, unspecified: Secondary | ICD-10-CM | POA: Diagnosis not present

## 2017-04-08 DIAGNOSIS — M810 Age-related osteoporosis without current pathological fracture: Secondary | ICD-10-CM | POA: Diagnosis present

## 2017-04-08 DIAGNOSIS — E039 Hypothyroidism, unspecified: Secondary | ICD-10-CM | POA: Diagnosis present

## 2017-04-08 DIAGNOSIS — R1310 Dysphagia, unspecified: Secondary | ICD-10-CM | POA: Diagnosis present

## 2017-04-08 DIAGNOSIS — R509 Fever, unspecified: Secondary | ICD-10-CM | POA: Diagnosis not present

## 2017-04-08 DIAGNOSIS — R0789 Other chest pain: Secondary | ICD-10-CM | POA: Diagnosis present

## 2017-04-08 DIAGNOSIS — J439 Emphysema, unspecified: Secondary | ICD-10-CM | POA: Diagnosis present

## 2017-04-08 DIAGNOSIS — F0391 Unspecified dementia with behavioral disturbance: Secondary | ICD-10-CM | POA: Diagnosis not present

## 2017-04-08 DIAGNOSIS — K219 Gastro-esophageal reflux disease without esophagitis: Secondary | ICD-10-CM | POA: Diagnosis present

## 2017-04-08 DIAGNOSIS — Z9841 Cataract extraction status, right eye: Secondary | ICD-10-CM

## 2017-04-08 DIAGNOSIS — C3412 Malignant neoplasm of upper lobe, left bronchus or lung: Secondary | ICD-10-CM | POA: Diagnosis not present

## 2017-04-08 DIAGNOSIS — D6481 Anemia due to antineoplastic chemotherapy: Secondary | ICD-10-CM | POA: Diagnosis present

## 2017-04-08 DIAGNOSIS — Z9981 Dependence on supplemental oxygen: Secondary | ICD-10-CM

## 2017-04-08 DIAGNOSIS — Z9842 Cataract extraction status, left eye: Secondary | ICD-10-CM

## 2017-04-08 DIAGNOSIS — Z8701 Personal history of pneumonia (recurrent): Secondary | ICD-10-CM

## 2017-04-08 DIAGNOSIS — I2782 Chronic pulmonary embolism: Secondary | ICD-10-CM | POA: Diagnosis not present

## 2017-04-08 DIAGNOSIS — Z87891 Personal history of nicotine dependence: Secondary | ICD-10-CM

## 2017-04-08 DIAGNOSIS — Z79899 Other long term (current) drug therapy: Secondary | ICD-10-CM

## 2017-04-08 LAB — URINALYSIS, ROUTINE W REFLEX MICROSCOPIC
BILIRUBIN URINE: NEGATIVE
Glucose, UA: NEGATIVE mg/dL
Hgb urine dipstick: NEGATIVE
KETONES UR: NEGATIVE mg/dL
Leukocytes, UA: NEGATIVE
Nitrite: NEGATIVE
PROTEIN: NEGATIVE mg/dL
SPECIFIC GRAVITY, URINE: 1.024 (ref 1.005–1.030)
pH: 5 (ref 5.0–8.0)

## 2017-04-08 LAB — CBC WITH DIFFERENTIAL/PLATELET
BASOS PCT: 0 %
Basophils Absolute: 0 10*3/uL (ref 0.0–0.1)
EOS ABS: 0 10*3/uL (ref 0.0–0.7)
Eosinophils Relative: 0 %
HEMATOCRIT: 32.9 % — AB (ref 36.0–46.0)
Hemoglobin: 10.9 g/dL — ABNORMAL LOW (ref 12.0–15.0)
LYMPHS PCT: 4 %
Lymphs Abs: 0.4 10*3/uL — ABNORMAL LOW (ref 0.7–4.0)
MCH: 33.2 pg (ref 26.0–34.0)
MCHC: 33.1 g/dL (ref 30.0–36.0)
MCV: 100.3 fL — AB (ref 78.0–100.0)
MONOS PCT: 6 %
Monocytes Absolute: 0.6 10*3/uL (ref 0.1–1.0)
NEUTROS ABS: 8.3 10*3/uL — AB (ref 1.7–7.7)
Neutrophils Relative %: 90 %
Platelets: 82 10*3/uL — ABNORMAL LOW (ref 150–400)
RBC: 3.28 MIL/uL — ABNORMAL LOW (ref 3.87–5.11)
RDW: 14.2 % (ref 11.5–15.5)
WBC: 9.3 10*3/uL (ref 4.0–10.5)

## 2017-04-08 LAB — TROPONIN I

## 2017-04-08 LAB — COMPREHENSIVE METABOLIC PANEL
ALBUMIN: 3.4 g/dL — AB (ref 3.5–5.0)
ALT: 21 U/L (ref 14–54)
AST: 25 U/L (ref 15–41)
Alkaline Phosphatase: 64 U/L (ref 38–126)
Anion gap: 7 (ref 5–15)
BILIRUBIN TOTAL: 0.5 mg/dL (ref 0.3–1.2)
BUN: 33 mg/dL — AB (ref 6–20)
CO2: 28 mmol/L (ref 22–32)
CREATININE: 0.78 mg/dL (ref 0.44–1.00)
Calcium: 8.9 mg/dL (ref 8.9–10.3)
Chloride: 104 mmol/L (ref 101–111)
GFR calc Af Amer: >60 mL/min (ref >60)
GFR calc non Af Amer: >60 mL/min (ref >60)
GLUCOSE: 157 mg/dL — AB (ref 65–99)
POTASSIUM: 5 mmol/L (ref 3.5–5.1)
Sodium: 139 mmol/L (ref 135–145)
TOTAL PROTEIN: 6.9 g/dL (ref 6.5–8.1)

## 2017-04-08 LAB — INFLUENZA PANEL BY PCR (TYPE A & B)
INFLAPCR: NEGATIVE
INFLBPCR: NEGATIVE

## 2017-04-08 LAB — PROTIME-INR
INR: 0.98
Prothrombin Time: 12.9 seconds (ref 11.4–15.2)

## 2017-04-08 LAB — I-STAT CG4 LACTIC ACID, ED: Lactic Acid, Venous: 1.54 mmol/L (ref 0.5–1.9)

## 2017-04-08 MED ORDER — IPRATROPIUM-ALBUTEROL 0.5-2.5 (3) MG/3ML IN SOLN
3.0000 mL | Freq: Four times a day (QID) | RESPIRATORY_TRACT | Status: DC
  Administered 2017-04-08: 3 mL via RESPIRATORY_TRACT
  Filled 2017-04-08: qty 3

## 2017-04-08 MED ORDER — PANTOPRAZOLE SODIUM 40 MG PO TBEC
40.0000 mg | DELAYED_RELEASE_TABLET | Freq: Every day | ORAL | Status: DC
  Administered 2017-04-09 – 2017-04-13 (×5): 40 mg via ORAL
  Filled 2017-04-08 (×5): qty 1

## 2017-04-08 MED ORDER — SODIUM CHLORIDE 0.9 % IV BOLUS (SEPSIS)
1000.0000 mL | Freq: Once | INTRAVENOUS | Status: AC
  Administered 2017-04-08: 1000 mL via INTRAVENOUS

## 2017-04-08 MED ORDER — DULOXETINE HCL 30 MG PO CPEP: 30.0000 mg | ORAL_CAPSULE | Freq: Every day | ORAL | Status: DC

## 2017-04-08 MED ORDER — ONDANSETRON HCL 4 MG/2ML IJ SOLN: 4.0000 mg | Freq: Four times a day (QID) | INTRAMUSCULAR | Status: DC | PRN

## 2017-04-08 MED ORDER — ONDANSETRON HCL 4 MG PO TABS
4.0000 mg | ORAL_TABLET | Freq: Four times a day (QID) | ORAL | Status: DC | PRN
  Administered 2017-04-10: 4 mg via ORAL
  Filled 2017-04-08: qty 1

## 2017-04-08 MED ORDER — ACETAMINOPHEN 325 MG PO TABS
650.0000 mg | ORAL_TABLET | Freq: Four times a day (QID) | ORAL | Status: DC | PRN
  Administered 2017-04-08: 650 mg via ORAL
  Filled 2017-04-08: qty 2

## 2017-04-08 MED ORDER — DONEPEZIL HCL 10 MG PO TABS
10.0000 mg | ORAL_TABLET | Freq: Every day | ORAL | Status: DC
  Administered 2017-04-08: 10 mg via ORAL
  Filled 2017-04-08: qty 2

## 2017-04-08 MED ORDER — CLONAZEPAM 0.5 MG PO TABS
0.5000 mg | ORAL_TABLET | Freq: Two times a day (BID) | ORAL | Status: DC | PRN
  Administered 2017-04-09: 0.5 mg via ORAL
  Filled 2017-04-08: qty 1

## 2017-04-08 MED ORDER — LEVOTHYROXINE SODIUM 100 MCG PO TABS
100.0000 ug | ORAL_TABLET | Freq: Every day | ORAL | Status: DC
  Administered 2017-04-09 – 2017-04-13 (×5): 100 ug via ORAL
  Filled 2017-04-08 (×5): qty 1

## 2017-04-08 MED ORDER — VANCOMYCIN HCL IN DEXTROSE 1-5 GM/200ML-% IV SOLN
1000.0000 mg | Freq: Once | INTRAVENOUS | Status: DC
  Filled 2017-04-08: qty 200

## 2017-04-08 MED ORDER — SODIUM CHLORIDE 0.9 % IV BOLUS (SEPSIS)
500.0000 mL | Freq: Once | INTRAVENOUS | Status: AC
  Administered 2017-04-08: 500 mL via INTRAVENOUS

## 2017-04-08 MED ORDER — ENOXAPARIN SODIUM 40 MG/0.4ML ~~LOC~~ SOLN
40.0000 mg | Freq: Every day | SUBCUTANEOUS | Status: DC
  Administered 2017-04-09 – 2017-04-12 (×5): 40 mg via SUBCUTANEOUS
  Filled 2017-04-08 (×6): qty 0.4

## 2017-04-08 MED ORDER — SODIUM CHLORIDE 0.9 % IV SOLN
INTRAVENOUS | Status: DC
  Administered 2017-04-08 – 2017-04-10 (×5): via INTRAVENOUS
  Administered 2017-04-10: 500 mL via INTRAVENOUS
  Administered 2017-04-11 – 2017-04-12 (×2): via INTRAVENOUS

## 2017-04-08 MED ORDER — QUETIAPINE 12.5 MG HALF TABLET: 12.5000 mg | ORAL_TABLET | Freq: Every day | ORAL | Status: DC

## 2017-04-08 MED ORDER — DEXTROSE 5 % IV SOLN
2.0000 g | Freq: Once | INTRAVENOUS | Status: AC
  Administered 2017-04-08: 2 g via INTRAVENOUS
  Filled 2017-04-08: qty 2

## 2017-04-08 MED ORDER — SODIUM CHLORIDE 0.9 % IV SOLN
1250.0000 mg | Freq: Once | INTRAVENOUS | Status: AC
  Administered 2017-04-08: 1250 mg via INTRAVENOUS
  Filled 2017-04-08: qty 1250

## 2017-04-08 NOTE — Progress Notes (Signed)
A consult was received from an ED physician for vancomycin and cefepime per pharmacy dosing.  The patient's profile has been reviewed for ht/wt/allergies/indication/available labs.   A one time order has been placed for cefepime 2g and vancomycin 1250 mg.  Further antibiotics/pharmacy consults should be ordered by admitting physician if indicated.                       Thank you, Hershal Coria 04/08/2017  3:13 PM

## 2017-04-08 NOTE — ED Notes (Signed)
Pt BP 82/56 made aware.

## 2017-04-08 NOTE — ED Triage Notes (Signed)
Patient is from [facility] and transported via [facility]. Patient has a lung cancer and is receiving radiation in her chest. Patient received radiation treatment today and returned at 11:00 to facility. When she arrived back to the facility, patient had a temperature of 99.5 oral. She received TYLENOL 650mg  PO. Patient took a nap. After waking up from nap, her temp was 100.5 oral post 2 hours of Tylenol administered. Patient is alert, confused but at her baseline. She normally uses oxygen 3L.

## 2017-04-08 NOTE — ED Provider Notes (Signed)
[place] DEPT Provider Note   CSN: [id number] Arrival date & time: 04/08/17  1441   LEVEL 5 CAVEAT - DEMENTIA  History   Chief Complaint Chief Complaint  Patient presents with  . Fever  . Chest Pain    HPI Rebecca [name] is a 83 y.o. female.  HPI  83 year old female with a history of dementia and stage IIa non-small cell lung cancer, squamous cell carcinoma with a lingual mass presents with a fever.  History is taken from nurses/chart review from EMS drop off.  The patient does not seem to know why she is here.  She does endorse a cough but cannot tell me how long is been going on.  Apparently the patient had radiation to her chest this morning and got back to her facility 11 AM.  When she got back she had a oral temperature of 99.5 and received Tylenol.  After waking up from a nap her temperature was 100.5 orally and so she was sent here for further evaluation.  She tells me she has right-sided chest pain and some shortness of breath.  Unclear if these are new.  She is chronically on 3 L oxygen.  She arrives with a MOST form and DNR.  Past Medical History:  Diagnosis Date  . Anemia, unspecified 03/18/2011  . Anxiety state, unspecified 01/2011  . Blood in stool 06/15/2012  . Corns and callosities 07/01/2011  . Dementia   . Diverticulosis 02/2011  . Dizziness   . DJD (degenerative joint disease)   . Dysuria 06/17/2011  . Hemorrhoids 02/2011  . Low back pain   . Lung cancer ([place])    left  . Mitral valve problem thickened   thickened  . Osteoarthritis of both hands 10/09/2015  . Osteoporosis 02/2011  . Other abnormal blood chemistry 0/30/2012  . Other acquired deformity of toe 12/16/2011  . Pain in joint, site unspecified 01/2012  . 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 Sacroiliitis, not elsewhere classified ([place]) 05/2011  . Seizures ([place])    Remotely  . Unspecified hypothyroidism 01/2011  . 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 Vitamin D deficiency 01/2011    Patient Active Problem List   Diagnosis Date Noted  . Stage II squamous cell carcinoma of left lung ([place]) 02/25/2017  . Encounter for antineoplastic chemotherapy 02/25/2017  . Goals of care, counseling/discussion 02/25/2017  . Squamous NSCLC ([place]) 01/29/2017  . ILD (interstitial lung disease) ([place]) 11/28/2016  . Arthritis 11/28/2016  . Gross hematuria 11/16/2016  . GERD (gastroesophageal reflux disease) 05/20/2016  . Torus palatinus 04/18/2016  . Osteoarthritis, multiple sites 10/09/2015  . Back pain 06/01/2015  . Unstable gait 04/16/2015  . Abnormal finding on EKG-new inferior/ septal TWI 03/28/2015  . DNR no code (do not resuscitate) 03/28/2015  . DVT, femoral, acute ([place]) 03/28/2015  . Chronic pulmonary embolism ([place]) 03/28/2015  . Dyslipidemia 03/12/2015  . Seizure disorder ([place]) 03/11/2015  . Depression 09/26/2014  . Dementia with behavioral disturbance 09/19/2012  . Hypothyroidism 12/08/2006    Past Surgical History:  Procedure Laterality Date  . bletheroplasty    . CATARACT EXTRACTION W/ INTRAOCULAR LENS  IMPLANT, BILATERAL  2010  . CERVICAL LAMINECTOMY  2005  . CYSTOSCOPY WITH RETROGRADE PYELOGRAM, URETEROSCOPY AND STENT PLACEMENT Bilateral 11/16/2016   Procedure: CYSTOSCOPY WITH RETROGRADE PYELOGRAMS/CLOT EVACUATION;  Surgeon: Irine Seal, MD;  Location: [facility] ORS;  Service: Urology;  Laterality: Bilateral;  . HAMMER TOE SURGERY  2013   right 2nd toe Dr. Mallie Mussel  . IVC FILTER PLACEMENT ([facility] HX)    . JOINT REPLACEMENT Bilateral 2002 Right, 1992 Left   knees  . TRANSURETHRAL RESECTION OF BLADDER TUMOR N/A 11/16/2016   Procedure: TRANSURETHRAL RESECTION OF BLADDER TUMOR (TURBT);  Surgeon: Irine Seal, MD;  Location: [facility];  Service: Urology;  Laterality: N/A;    OB History    No data available       Home Medications    Prior to Admission medications   Medication Sig Start Date End Date  Taking? Authorizing Provider  acetaminophen (TYLENOL) 325 MG tablet Take 650 mg by mouth 2 (two) times daily.   Yes [provider]  Calcium Carbonate-Vitamin D (CALCIUM 600+D) 600-400 MG-UNIT tablet Take 1 tablet by mouth daily.   Yes [provider]  Cholecalciferol (VITAMIN D3) 5000 units TABS Take 1 tablet by mouth daily. Take one tablet daily    Yes [provider]  clonazePAM (KLONOPIN) 0.5 MG tablet Take 1 tablet (0.5 mg total) by mouth as directed. Take one tablet every morning scheduled and then at bedtime as needed. 02/09/17  Yes [name], MD  donepezil (ARICEPT) 10 MG tablet Take 10 mg by mouth. Take one tablet in evening for memory   Yes [provider]  DULoxetine (CYMBALTA) 30 MG capsule Take 30 mg by mouth daily.    Yes [provider]  levothyroxine (SYNTHROID, LEVOTHROID) 100 MCG tablet Take 1 tablet (100 mcg total) by mouth daily before breakfast. For thyroid 09/28/14  Yes Estill Dooms, MD  meloxicam (MOBIC) 7.5 MG tablet Take 7.5 mg by mouth daily as needed for pain.    Yes [provider]  memantine (NAMENDA) 10 MG tablet Take 10 mg by mouth 2 (two) times daily.   Yes [provider]  Multiple Vitamins-Minerals (THERA M PLUS PO) Take 1 tablet by mouth daily.    Yes [provider]  Nutritional Supplements (BOOST PLUS PO) Take 1 each by mouth daily.   Yes [provider]  Omega-3 1000 MG CAPS Take 1 capsule by mouth daily. Take one capsule every morning    Yes [provider]  omeprazole (PRILOSEC) 20 MG capsule Take 20 mg by mouth at bedtime.   Yes [provider]  phenytoin (DILANTIN) 100 MG ER capsule Take 100-200 mg by mouth See admin instructions. Take one capsule every morning; take 2 capsules at bedtime    Yes [provider]  prochlorperazine (COMPAZINE) 10 MG tablet Take 1 tablet (10 mg total) by mouth every 6 (six) hours as needed for nausea or vomiting.  02/25/17  Yes Curt Bears, MD  QUEtiapine (SEROQUEL) 25 MG tablet Take 12.5 mg by mouth daily. At 8 pm   Yes [provider]  umeclidinium-vilanterol (ANORO ELLIPTA) 62.5-25 MCG/INH AEPB Inhale 1 puff into the lungs daily. 02/05/17  Yes Javier Glazier, MD  OXYGEN Inhale 2 L/min into the lungs as needed. To keep O2 Sat >90%    [provider]  triamcinolone cream (KENALOG) 0.1 % Apply 1 application topically daily as needed (for rash).  03/06/16   [provider]  trolamine salicylate (ASPERCREME) 10 % cream Apply 1 application topically. Apply as needed    [provider]    Family History Family History  Problem Relation Age of Onset  . Alzheimer's disease Sister   . Emphysema Sister   . Emphysema Father   . Rheumatologic disease Neg Hx  Social History Social History   Tobacco Use  . Smoking status: Former Smoker    Packs/day: 0.50    Years: 50.00    Pack years: 25.00    Last attempt to quit: 09/14/1988    Years since quitting: 28.5  . Smokeless tobacco: Never Used  . Tobacco comment: Smoked <1ppd  Substance Use Topics  . Alcohol use: No    Alcohol/week: 0.6 oz    Types: 1 Glasses of wine per week    Frequency: Never  . Drug use: No     Allergies   Macrodantin [nitrofurantoin]; Morphine and related; and Sulfa antibiotics   Review of Systems Review of Systems  Unable to perform ROS: Dementia     Physical Exam Updated Vital Signs BP (!) 83/51 (BP Location: Left Arm) Comment: RN  aware  Pulse (!) 56   Temp 99.5 F (37.5 C) (Oral)   Resp (!) 30   SpO2 95%   Physical Exam  Constitutional: She is oriented to person, place, and time. She appears well-developed and well-nourished.  Non-toxic appearance. She does not appear ill. No distress.  HENT:  Head: Normocephalic and atraumatic.  Right Ear: External ear normal.  Left Ear: External ear normal.  Nose: Nose normal.  Eyes: Right eye exhibits no discharge. Left eye  exhibits no discharge.  Cardiovascular: Normal rate, regular rhythm and normal heart sounds.  Pulmonary/Chest: Effort normal. No accessory muscle usage or stridor. No tachypnea. No respiratory distress. She has decreased breath sounds in the right lower field and the left lower field. She has rales in the right lower field.  Abdominal: Soft. There is no tenderness.  Musculoskeletal:       Right lower leg: She exhibits no edema.       Left lower leg: She exhibits no edema.  Neurological: She is alert and oriented to person, place, and time.  Skin: Skin is warm and dry.  Nursing note and vitals reviewed.    ED Treatments / Results  Labs (all labs ordered are listed, but only abnormal results are displayed) Labs Reviewed  COMPREHENSIVE METABOLIC PANEL - Abnormal; Notable for the following components:      Result Value   Glucose, Bld 157 (*)    BUN 33 (*)    Albumin 3.4 (*)    All other components within normal limits  CBC WITH DIFFERENTIAL/PLATELET - Abnormal; Notable for the following components:   RBC 3.28 (*)    Hemoglobin 10.9 (*)    HCT 32.9 (*)    MCV 100.3 (*)    Platelets 82 (*)    Neutro Abs 8.3 (*)    Lymphs Abs 0.4 (*)    All other components within normal limits  CULTURE, BLOOD (ROUTINE X 2)  CULTURE, BLOOD (ROUTINE X 2)  URINE CULTURE  PROTIME-INR  URINALYSIS, ROUTINE W REFLEX MICROSCOPIC  TROPONIN I  INFLUENZA PANEL BY PCR (TYPE A & B)  TROPONIN I  TROPONIN I  TROPONIN I  CBC  CREATININE, SERUM  CBC  COMPREHENSIVE METABOLIC PANEL  I-STAT CG4 LACTIC ACID, ED    EKG  EKG Interpretation  Date/Time:  Wednesday April 08 2017 17:03:39 EST Ventricular Rate:  67 PR Interval:    QRS Duration: 108 QT Interval:  412 QTC Calculation: 435 R Axis:   8 Text Interpretation:  Sinus rhythm Low voltage, extremity and precordial leads no significant change since earlier in the day Artifact Confirmed by Sherwood Gambler [phone]) on 04/08/2017 7:05:27 PM  Radiology No results found.  Procedures Procedures (including critical care time)  Medications Ordered in ED Medications  DULoxetine (CYMBALTA) DR capsule 30 mg (not administered)  clonazePAM (KLONOPIN) tablet 0.5 mg (not administered)  donepezil (ARICEPT) tablet 10 mg (10 mg Oral Given 04/08/17 2324)  acetaminophen (TYLENOL) tablet 650 mg (not administered)  levothyroxine (SYNTHROID, LEVOTHROID) tablet 100 mcg (not administered)  pantoprazole (PROTONIX) EC tablet 40 mg (not administered)  QUEtiapine (SEROQUEL) tablet 12.5 mg (not administered)  enoxaparin (LOVENOX) injection 40 mg (not administered)  0.9 %  sodium chloride infusion ( Intravenous New Bag/Given 04/08/17 2322)  ondansetron (ZOFRAN) tablet 4 mg (not administered)    Or  ondansetron (ZOFRAN) injection 4 mg (not administered)  ipratropium-albuterol (DUONEB) 0.5-2.5 (3) MG/3ML nebulizer solution 3 mL (3 mLs Nebulization Given 04/08/17 2325)  sodium chloride 0.9 % bolus 1,000 mL (0 mLs Intravenous Stopped 04/08/17 1829)    And  sodium chloride 0.9 % bolus 1,000 mL (0 mLs Intravenous Stopped 04/08/17 2200)  ceFEPIme (MAXIPIME) 2 g in dextrose 5 % 50 mL IVPB (0 g Intravenous Stopped 04/08/17 1829)  vancomycin (VANCOCIN) 1,250 mg in sodium chloride 0.9 % 250 mL IVPB (0 mg Intravenous Stopped 04/08/17 1829)  sodium chloride 0.9 % bolus 500 mL (0 mLs Intravenous Stopped 04/08/17 2021)     Initial Impression / Assessment and Plan / ED Course  I have reviewed the triage vital signs and the nursing notes.  Pertinent labs & imaging results that were available during my care of the patient were reviewed by me and considered in my medical decision making (see chart for details).      Patient has low-grade fever as well as hypotension.  Oddly her heart rate is normal and not reflexively tachycardic.  She does not otherwise look ill.  However she is at much higher risk for infectious disease given her chemotherapy.  Thus she  was given broad IV antibiotics.  No clear bacterial source seen.  No altered mental status from baseline.  Her hypertension has improved with fluids.  She will need to be observed in the hospital with further supportive care.  She is a DNR and does have a MOST form.  This indicates fluids and antibiotics are okay.  The son has confirmed this.  Admit to the hospitalist.  Final Clinical Impressions(s) / ED Diagnoses   Final diagnoses:  Hypotension, unspecified hypotension type    ED Discharge Orders    None       Sherwood Gambler, MD 04/08/17 2349

## 2017-04-08 NOTE — ED Notes (Signed)
Bed: RESB Expected date:  Expected time:  Means of arrival:  Comments: EMS/cancer/fever

## 2017-04-08 NOTE — H&P (Signed)
[facility] H&P    Patient Demographics:    Rebecca [name], is a 83 y.o. female  MRN: [id number]  DOB - 1928/02/08  Admit Date - 04/08/2017  Referring MD/NP/PA: Dr. Regenia Skeeter  Outpatient Primary MD for the patient is [name], MD  Patient coming from: Skilled facility  Chief Complaint  Patient presents with  . Fever  . Chest Pain      HPI:    Rebecca [name]  is a 83 y.o. female,.  With history of dementia, stage IIa non-small cell lung cancer, squamous cell carcinoma with lingual mass [facility], no fever.  Patient received her last chemotherapy on Monday with weekly carboplatin and paclitaxel status post cycle 4 and radiation treatment on Tuesday. Patient also has chronic cough.  She does have history of pulmonary fibrosis, emphysema and is followed by pulmonary as outpatient.  She complains of left upper  chest wall pain. Denies nausea, vomiting or diarrhea. No dysuria, urgency frequency of urination.  In the ED, all the lab work and imaging studies were unremarkable. Lactic acid 1.54. Patient was found to be hypotensive, with initial BP of 83/51, much improved with IV fluids now 103/70.  Patient was empirically started on vancomycin and cefepime for possible sepsis    Review of systems:      All other systems reviewed and are negative.   With Past History of the following :    Past Medical History:  Diagnosis Date  . Anemia, unspecified 03/18/2011  . Anxiety state, unspecified 01/2011  . Blood in stool 06/15/2012  . Corns and callosities 07/01/2011  . Dementia   . Diverticulosis 02/2011  . Dizziness   . DJD (degenerative joint disease)   . Dysuria 06/17/2011  . Hemorrhoids 02/2011  . Low back pain   . Lung cancer ([place])    left  . Mitral valve problem thickened   thickened  . Osteoarthritis of both hands 10/09/2015  . Osteoporosis 02/2011  . Other abnormal blood  chemistry 0/30/2012  . Other acquired deformity of toe 12/16/2011  . Pain in joint, site unspecified 01/2012  . 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 Sacroiliitis, not elsewhere classified ([place]) 05/2011  . Seizures ([place])    Remotely  . Unspecified hypothyroidism 01/2011  . 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 Vitamin D deficiency 01/2011      Past Surgical History:  Procedure Laterality Date  . bletheroplasty    . CATARACT EXTRACTION W/ INTRAOCULAR LENS  IMPLANT, BILATERAL  2010  . CERVICAL LAMINECTOMY  2005  . CYSTOSCOPY WITH RETROGRADE PYELOGRAM, URETEROSCOPY AND STENT PLACEMENT Bilateral 11/16/2016   Procedure: CYSTOSCOPY WITH RETROGRADE PYELOGRAMS/CLOT EVACUATION;  Surgeon: Irine Seal, MD;  [facility];  Service: Urology;  Laterality: Bilateral;  . HAMMER TOE SURGERY  2013   right 2nd toe Dr. Mallie Mussel  . IVC FILTER PLACEMENT ([facility] HX)    . JOINT REPLACEMENT Bilateral 2002 Right, 1992 Left   knees  . TRANSURETHRAL RESECTION OF BLADDER TUMOR N/A  11/16/2016   Procedure: TRANSURETHRAL RESECTION OF BLADDER TUMOR (TURBT);  Surgeon: Irine Seal, MD;  [facility];  Service: Urology;  Laterality: N/A;      Social History:      Social History   Tobacco Use  . Smoking status: Former Smoker    Packs/day: 0.50    Years: 50.00    Pack years: 25.00    Last attempt to quit: 09/14/1988    Years since quitting: 28.5  . Smokeless tobacco: Never Used  . Tobacco comment: Smoked <1ppd  Substance Use Topics  . Alcohol use: No    Alcohol/week: 0.6 oz    Types: 1 Glasses of wine per week    Frequency: Never       Family History :     Family History  Problem Relation Age of Onset  . Alzheimer's disease Sister   . Emphysema Sister   . Emphysema Father   . Rheumatologic disease Neg Hx       Home Medications:   Prior to Admission medications   Medication Sig Start Date End Date Taking? Authorizing Provider    acetaminophen (TYLENOL) 325 MG tablet Take 650 mg by mouth 2 (two) times daily.   Yes [provider]  Cholecalciferol (VITAMIN D3) 5000 units TABS Take 1 tablet by mouth daily. Take one tablet daily    Yes [provider]  levothyroxine (SYNTHROID, LEVOTHROID) 100 MCG tablet Take 1 tablet (100 mcg total) by mouth daily before breakfast. For thyroid 09/28/14  Yes Estill Dooms, MD  Multiple Vitamins-Minerals (THERA M PLUS PO) Take 1 tablet by mouth daily.    Yes [provider]  omeprazole (PRILOSEC) 20 MG capsule Take 20 mg by mouth at bedtime.   Yes [provider]  OXYGEN Inhale 2 L/min into the lungs as needed. To keep O2 Sat >90%   Yes [provider]  prochlorperazine (COMPAZINE) 10 MG tablet Take 1 tablet (10 mg total) by mouth every 6 (six) hours as needed for nausea or vomiting. 02/25/17  Yes Curt Bears, MD  QUEtiapine (SEROQUEL) 25 MG tablet Take 12.5 mg by mouth daily. At 8 pm   Yes [provider]  clonazePAM (KLONOPIN) 0.5 MG tablet Take 1 tablet (0.5 mg total) by mouth as directed. Take one tablet every morning scheduled and then at bedtime as needed. Patient not taking: Reported on 04/08/2017 02/09/17   [name], MD  donepezil (ARICEPT) 10 MG tablet Take 10 mg by mouth. Take one tablet in evening for memory    [provider]  DULoxetine (CYMBALTA) 30 MG capsule Take 30 mg by mouth daily.     [provider]  umeclidinium-vilanterol (ANORO ELLIPTA) 62.5-25 MCG/INH AEPB Inhale 1 puff into the lungs daily. Patient not taking: Reported on 04/08/2017 02/05/17   Javier Glazier, MD     Allergies:     Allergies  Allergen Reactions  . Macrodantin [Nitrofurantoin]   . Morphine And Related Other (See Comments)    Altered mental state   . Sulfa Antibiotics Other (See Comments)    Reaction: unknown     Physical Exam:   Vitals  Blood pressure 103/70, pulse 64, temperature 98.9 F (37.2 C),  resp. rate (!) 21, height 5\' 3"  (1.6 m), weight 63 kg (139 lb), SpO2 95 %.  1.  General: Appears in no acute distress  2. Psychiatric:  Intact judgement and  insight, awake alert, oriented x 3.  3. Neurologic: No focal neurological deficits, all cranial  nerves intact.Strength 5/5 all 4 extremities, sensation intact all 4 extremities, plantars down going.  4. Eyes :  anicteric sclerae, moist conjunctivae with no lid lag. PERRLA.  5. ENMT:  Oropharynx clear with moist mucous membranes and good dentition  6. Neck:  supple, no cervical lymphadenopathy appriciated, No thyromegaly  7. Respiratory : Normal respiratory effort, bilateral rhonchi  8. Cardiovascular : RRR, no gallops, rubs or murmurs, no leg edema  9. Gastrointestinal:  Positive bowel sounds, abdomen soft, non-tender to palpation,no hepatosplenomegaly, no rigidity or guarding       10. Skin:  No cyanosis, normal texture and turgor, no rash, lesions or ulcers  11.Musculoskeletal:  Good muscle tone,  joints appear normal , no effusions,  normal range of motion    Data Review:    CBC Recent Labs  Lab 04/06/17 1022 04/08/17 1506  WBC 5.5 9.3  HGB 11.6 10.9*  HCT 35.1 32.9*  PLT 110* 82*  MCV 99.7 100.3*  MCH 33.1 33.2  MCHC 33.2 33.1  RDW 13.9 14.2  LYMPHSABS 0.3* 0.4*  MONOABS 0.5 0.6  EOSABS 0.1 0.0  BASOSABS 0.0 0.0   ------------------------------------------------------------------------------------------------------------------  Chemistries  Recent Labs  Lab 04/06/17 1022 04/08/17 1506  NA 142 139  K 4.4 5.0  CL  --  104  CO2 28 28  GLUCOSE 91 157*  BUN 29.8* 33*  CREATININE 0.9 0.78  CALCIUM 9.6 8.9  AST 23 25  ALT 24 21  ALKPHOS 72 64  BILITOT 0.25 0.5    ------------------------------------------------------------------------------------------------------------------  ------------------------------------------------------------------------------------------------------------------ GFR: Estimated Creatinine Clearance: 44.3 mL/min (by C-G formula based on SCr of 0.78 mg/dL). Liver Function Tests: Recent Labs  Lab 04/06/17 1022 04/08/17 1506  AST 23 25  ALT 24 21  ALKPHOS 72 64  BILITOT 0.25 0.5  PROT 7.1 6.9  ALBUMIN 3.2* 3.4*   No results for input(s): LIPASE, AMYLASE in the last 168 hours. No results for input(s): AMMONIA in the last 168 hours. Coagulation Profile: Recent Labs  Lab 04/08/17 1506  INR 0.98   Cardiac Enzymes: Recent Labs  Lab 04/08/17 1506  TROPONINI <0.03    --------------------------------------------------------------------------------------------------------------- Urine analysis:    Component Value Date/Time   COLORURINE YELLOW 04/08/2017 1914   APPEARANCEUR CLEAR 04/08/2017 1914   LABSPEC 1.024 04/08/2017 1914   PHURINE 5.0 04/08/2017 1914   GLUCOSEU NEGATIVE 04/08/2017 1914   HGBUR NEGATIVE 04/08/2017 [place] NEGATIVE 04/08/2017 1914   BILIRUBINUR n 08/08/2013 [place] 04/08/2017 1914   PROTEINUR NEGATIVE 04/08/2017 1914   UROBILINOGEN 0.2 03/11/2015 1926   NITRITE NEGATIVE 04/08/2017 1914   LEUKOCYTESUR NEGATIVE 04/08/2017 1914      Imaging Results:    Dg Chest 2 View  Result Date: 04/08/2017 CLINICAL DATA:  Chest pain and lung cancer EXAM: CHEST  2 VIEW COMPARISON:  01/29/2017 FINDINGS: Low volume chest with coarse interstitial opacities. When accounting for lower volumes and interstitial crowding no acute superimposed opacity. No effusion or pneumothorax. Chronic cardiomegaly. Advanced disc degeneration with scoliosis. IMPRESSION: 1. Pulmonary fibrosis. Accounting for low lung volumes and interstitial crowding, no acute opacity identified. 2. A lingular  nodule that was biopsied 01/29/2017 is not discretely visible. Electronically Signed   By: Monte Fantasia M.D.   On: 04/08/2017 15:52    My personal review of EKG: Rhythm NSR   Assessment & Plan:    Active Problems:   Hypotension   1. Hypotension-resolved, unclear etiology, patient's blood pressure usually tends to run low as per her son.  Will observe overnight, continue gentle IV hydration with normal saline at 75 mL/h.  Patient has been empirically started on vancomycin and cefepime per pharmacy for possible sepsis.  Consider stopping antibiotics in a.m. if patient's blood cultures remain negative and blood pressure remained stable. 2. Pulmonary fibrosis-chest x-ray shows pulmonary fibrosis, no acute opacity noted on x-ray.  Continue duo nebs every 6 hours  3. Lung cancer-patient is currently on chemo and radiation treatment.  Followed by oncology as outpatient 4. Left chest wall pain-atypical, continue Tylenol as needed.  EKG showed normal sinus rhythm.  Initial troponin is negative in the ED.  Will cycle troponin every 6 hours x3 5. Dementia-continue Aricept 6. Hypothyroidism-continue Synthroid   DVT Prophylaxis-   Lovenox   AM Labs Ordered, also please review Full Orders  Family Communication: Admission, patients condition and plan of care including tests being ordered have been discussed with the patient and son at bedsideher * who indicate understanding and agree with the plan and Code Status.  Code Status: DNR  Admission status: Observation  Time spent in minutes : 60 minutes   Oswald Hillock M.D on 04/08/2017 at 8:58 PM  Between 7am to 7pm - Pager - [phone]. After 7pm go to [email or web address] - password [facility]  [facility] - Office  [phone]

## 2017-04-08 NOTE — Telephone Encounter (Signed)
"  Rebecca [name] with [facility].  This patient has fever of 100.5 after two tylenol, chills, nausea.  Our PCP wants to send her out but family said no hospital unless Dr. Julien Nordmann says it's necessary  Please call Hoyle Sauer or Rebecca [name] [phone] with any orders or we'll have to send her out"  1443 noted pa5ient has arrived [facility] ED.  Message left to notify collaborative.

## 2017-04-09 ENCOUNTER — Other Ambulatory Visit: Payer: Self-pay

## 2017-04-09 DIAGNOSIS — R0789 Other chest pain: Secondary | ICD-10-CM | POA: Diagnosis present

## 2017-04-09 DIAGNOSIS — C3412 Malignant neoplasm of upper lobe, left bronchus or lung: Secondary | ICD-10-CM | POA: Diagnosis not present

## 2017-04-09 DIAGNOSIS — G9341 Metabolic encephalopathy: Secondary | ICD-10-CM | POA: Diagnosis not present

## 2017-04-09 DIAGNOSIS — R4182 Altered mental status, unspecified: Secondary | ICD-10-CM | POA: Diagnosis not present

## 2017-04-09 DIAGNOSIS — Z87891 Personal history of nicotine dependence: Secondary | ICD-10-CM | POA: Diagnosis not present

## 2017-04-09 DIAGNOSIS — D6959 Other secondary thrombocytopenia: Secondary | ICD-10-CM | POA: Diagnosis present

## 2017-04-09 DIAGNOSIS — I2699 Other pulmonary embolism without acute cor pulmonale: Secondary | ICD-10-CM | POA: Diagnosis not present

## 2017-04-09 DIAGNOSIS — R41841 Cognitive communication deficit: Secondary | ICD-10-CM | POA: Diagnosis not present

## 2017-04-09 DIAGNOSIS — D6481 Anemia due to antineoplastic chemotherapy: Secondary | ICD-10-CM | POA: Diagnosis present

## 2017-04-09 DIAGNOSIS — I959 Hypotension, unspecified: Secondary | ICD-10-CM | POA: Diagnosis not present

## 2017-04-09 DIAGNOSIS — J849 Interstitial pulmonary disease, unspecified: Secondary | ICD-10-CM | POA: Diagnosis not present

## 2017-04-09 DIAGNOSIS — T451X5A Adverse effect of antineoplastic and immunosuppressive drugs, initial encounter: Secondary | ICD-10-CM | POA: Diagnosis present

## 2017-04-09 DIAGNOSIS — K219 Gastro-esophageal reflux disease without esophagitis: Secondary | ICD-10-CM | POA: Diagnosis not present

## 2017-04-09 DIAGNOSIS — R0602 Shortness of breath: Secondary | ICD-10-CM | POA: Diagnosis not present

## 2017-04-09 DIAGNOSIS — J8410 Pulmonary fibrosis, unspecified: Secondary | ICD-10-CM | POA: Diagnosis present

## 2017-04-09 DIAGNOSIS — Z961 Presence of intraocular lens: Secondary | ICD-10-CM | POA: Diagnosis present

## 2017-04-09 DIAGNOSIS — F0391 Unspecified dementia with behavioral disturbance: Secondary | ICD-10-CM | POA: Diagnosis not present

## 2017-04-09 DIAGNOSIS — C3490 Malignant neoplasm of unspecified part of unspecified bronchus or lung: Secondary | ICD-10-CM | POA: Diagnosis present

## 2017-04-09 DIAGNOSIS — Z79899 Other long term (current) drug therapy: Secondary | ICD-10-CM | POA: Diagnosis not present

## 2017-04-09 DIAGNOSIS — Z9841 Cataract extraction status, right eye: Secondary | ICD-10-CM | POA: Diagnosis not present

## 2017-04-09 DIAGNOSIS — Z9842 Cataract extraction status, left eye: Secondary | ICD-10-CM | POA: Diagnosis not present

## 2017-04-09 DIAGNOSIS — J439 Emphysema, unspecified: Secondary | ICD-10-CM | POA: Diagnosis present

## 2017-04-09 DIAGNOSIS — J80 Acute respiratory distress syndrome: Secondary | ICD-10-CM | POA: Diagnosis not present

## 2017-04-09 DIAGNOSIS — T17920A Food in respiratory tract, part unspecified causing asphyxiation, initial encounter: Secondary | ICD-10-CM | POA: Diagnosis not present

## 2017-04-09 DIAGNOSIS — G40909 Epilepsy, unspecified, not intractable, without status epilepticus: Secondary | ICD-10-CM | POA: Diagnosis not present

## 2017-04-09 DIAGNOSIS — R29898 Other symptoms and signs involving the musculoskeletal system: Secondary | ICD-10-CM | POA: Diagnosis not present

## 2017-04-09 DIAGNOSIS — Z66 Do not resuscitate: Secondary | ICD-10-CM | POA: Diagnosis present

## 2017-04-09 DIAGNOSIS — R2681 Unsteadiness on feet: Secondary | ICD-10-CM | POA: Diagnosis not present

## 2017-04-09 DIAGNOSIS — F411 Generalized anxiety disorder: Secondary | ICD-10-CM | POA: Diagnosis present

## 2017-04-09 DIAGNOSIS — E039 Hypothyroidism, unspecified: Secondary | ICD-10-CM | POA: Diagnosis not present

## 2017-04-09 DIAGNOSIS — Z96653 Presence of artificial knee joint, bilateral: Secondary | ICD-10-CM | POA: Diagnosis present

## 2017-04-09 DIAGNOSIS — C3492 Malignant neoplasm of unspecified part of left bronchus or lung: Secondary | ICD-10-CM | POA: Diagnosis not present

## 2017-04-09 DIAGNOSIS — J9611 Chronic respiratory failure with hypoxia: Secondary | ICD-10-CM | POA: Diagnosis present

## 2017-04-09 DIAGNOSIS — Z9981 Dependence on supplemental oxygen: Secondary | ICD-10-CM | POA: Diagnosis not present

## 2017-04-09 DIAGNOSIS — M6281 Muscle weakness (generalized): Secondary | ICD-10-CM | POA: Diagnosis not present

## 2017-04-09 DIAGNOSIS — R05 Cough: Secondary | ICD-10-CM | POA: Diagnosis not present

## 2017-04-09 DIAGNOSIS — M810 Age-related osteoporosis without current pathological fracture: Secondary | ICD-10-CM | POA: Diagnosis present

## 2017-04-09 DIAGNOSIS — R1310 Dysphagia, unspecified: Secondary | ICD-10-CM | POA: Diagnosis not present

## 2017-04-09 LAB — TROPONIN I
Troponin I: <0.03 ng/mL (ref <0.03)
Troponin I: <0.03 ng/mL (ref <0.03)

## 2017-04-09 LAB — COMPREHENSIVE METABOLIC PANEL
ALBUMIN: 2.7 g/dL — AB (ref 3.5–5.0)
ALK PHOS: 51 U/L (ref 38–126)
ALT: 16 U/L (ref 14–54)
AST: 18 U/L (ref 15–41)
Anion gap: 6 (ref 5–15)
BILIRUBIN TOTAL: 0.7 mg/dL (ref 0.3–1.2)
BUN: 26 mg/dL — AB (ref 6–20)
CO2: 22 mmol/L (ref 22–32)
Calcium: 7.7 mg/dL — ABNORMAL LOW (ref 8.9–10.3)
Chloride: 110 mmol/L (ref 101–111)
Creatinine, Ser: 0.55 mg/dL (ref 0.44–1.00)
GFR calc Af Amer: >60 mL/min (ref >60)
GFR calc non Af Amer: >60 mL/min (ref >60)
GLUCOSE: 138 mg/dL — AB (ref 65–99)
POTASSIUM: 4.4 mmol/L (ref 3.5–5.1)
Sodium: 138 mmol/L (ref 135–145)
TOTAL PROTEIN: 5.8 g/dL — AB (ref 6.5–8.1)

## 2017-04-09 LAB — CBC
HEMATOCRIT: 30 % — AB (ref 36.0–46.0)
HEMOGLOBIN: 9.9 g/dL — AB (ref 12.0–15.0)
MCH: 33.2 pg (ref 26.0–34.0)
MCHC: 33 g/dL (ref 30.0–36.0)
MCV: 100.7 fL — AB (ref 78.0–100.0)
Platelets: 71 10*3/uL — ABNORMAL LOW (ref 150–400)
RBC: 2.98 MIL/uL — ABNORMAL LOW (ref 3.87–5.11)
RDW: 14.4 % (ref 11.5–15.5)
WBC: 6.4 10*3/uL (ref 4.0–10.5)

## 2017-04-09 LAB — MRSA PCR SCREENING: MRSA by PCR: NEGATIVE

## 2017-04-09 LAB — CREATININE, SERUM
Creatinine, Ser: 0.55 mg/dL (ref 0.44–1.00)
GFR calc Af Amer: >60 mL/min (ref >60)

## 2017-04-09 LAB — CORTISOL-AM, BLOOD: Cortisol - AM: 15.5 ug/dL (ref 6.7–22.6)

## 2017-04-09 MED ORDER — CLONAZEPAM 0.5 MG PO TABS
0.5000 mg | ORAL_TABLET | Freq: Every evening | ORAL | Status: DC | PRN
  Administered 2017-04-10 (×2): 0.5 mg via ORAL
  Filled 2017-04-09 (×2): qty 1

## 2017-04-09 MED ORDER — BOOST PO LIQD
237.0000 mL | Freq: Every day | ORAL | Status: DC
  Administered 2017-04-09 – 2017-04-13 (×3): 237 mL via ORAL
  Filled 2017-04-09 (×5): qty 237

## 2017-04-09 MED ORDER — QUETIAPINE FUMARATE 25 MG PO TABS
12.5000 mg | ORAL_TABLET | Freq: Every day | ORAL | Status: DC
  Administered 2017-04-09 – 2017-04-12 (×4): 12.5 mg via ORAL
  Filled 2017-04-09 (×6): qty 1

## 2017-04-09 MED ORDER — PHENYTOIN SODIUM EXTENDED 100 MG PO CAPS
200.0000 mg | ORAL_CAPSULE | Freq: Every day | ORAL | Status: DC
  Administered 2017-04-09 – 2017-04-12 (×4): 200 mg via ORAL
  Filled 2017-04-09 (×4): qty 2

## 2017-04-09 MED ORDER — PROCHLORPERAZINE MALEATE 10 MG PO TABS: 10.0000 mg | ORAL_TABLET | Freq: Four times a day (QID) | ORAL | Status: DC | PRN

## 2017-04-09 MED ORDER — CLONAZEPAM 0.5 MG PO TABS
0.5000 mg | ORAL_TABLET | ORAL | Status: DC
  Administered 2017-04-10 – 2017-04-13 (×4): 0.5 mg via ORAL
  Filled 2017-04-09 (×4): qty 1

## 2017-04-09 MED ORDER — PHENYTOIN SODIUM EXTENDED 100 MG PO CAPS
100.0000 mg | ORAL_CAPSULE | Freq: Every day | ORAL | Status: DC
  Administered 2017-04-10 – 2017-04-13 (×4): 100 mg via ORAL
  Filled 2017-04-09 (×4): qty 1

## 2017-04-09 MED ORDER — MEMANTINE HCL 10 MG PO TABS
10.0000 mg | ORAL_TABLET | Freq: Two times a day (BID) | ORAL | Status: DC
  Administered 2017-04-09 – 2017-04-13 (×8): 10 mg via ORAL
  Filled 2017-04-09: qty 2
  Filled 2017-04-09 (×3): qty 1
  Filled 2017-04-09: qty 2
  Filled 2017-04-09 (×3): qty 1

## 2017-04-09 MED ORDER — IPRATROPIUM-ALBUTEROL 0.5-2.5 (3) MG/3ML IN SOLN
3.0000 mL | Freq: Three times a day (TID) | RESPIRATORY_TRACT | Status: DC
  Administered 2017-04-09 – 2017-04-13 (×13): 3 mL via RESPIRATORY_TRACT
  Filled 2017-04-09 (×12): qty 3

## 2017-04-09 MED ORDER — VANCOMYCIN HCL IN DEXTROSE 750-5 MG/150ML-% IV SOLN
750.0000 mg | INTRAVENOUS | Status: DC
  Administered 2017-04-09: 750 mg via INTRAVENOUS
  Filled 2017-04-09 (×2): qty 150

## 2017-04-09 MED ORDER — DEXTROSE 5 % IV SOLN
2.0000 g | INTRAVENOUS | Status: DC
  Administered 2017-04-09: 2 g via INTRAVENOUS
  Filled 2017-04-09 (×2): qty 2

## 2017-04-09 MED ORDER — UMECLIDINIUM-VILANTEROL 62.5-25 MCG/INH IN AEPB
1.0000 | INHALATION_SPRAY | Freq: Every day | RESPIRATORY_TRACT | Status: DC
  Administered 2017-04-10 – 2017-04-13 (×4): 1 via RESPIRATORY_TRACT
  Filled 2017-04-09: qty 14

## 2017-04-09 NOTE — ED Notes (Signed)
Pt refusing vitals.

## 2017-04-09 NOTE — Progress Notes (Signed)
Patient fingers are cool. It is very difficult to pick up o2 saturation. Continue to monitor by spot checks.

## 2017-04-09 NOTE — Progress Notes (Signed)
Pharmacy Antibiotic Note  Rebecca [name] is a 83 y.o. female admitted on 04/08/2017 with sepsis.  Pharmacy has been consulted for Vancomycin, cefepime dosing.  Plan: Vancomycin 1250mg  iv x1, then 750mg  iv q24hr Goal AUC = 400 - 500 for all indications, except meningitis (goal AUC > 500 and Cmin 15-20 mcg/mL) Cefepime 2gm iv x1, then 2gm iv q24hr   Height: 5\' 3"  (160 cm) Weight: 139 lb (63 kg) IBW/kg (Calculated) : 52.4  Temp (24hrs), Avg:99.2 F (37.3 C), Min:98.9 F (37.2 C), Max:99.5 F (37.5 C)  Recent Labs  Lab 04/06/17 1022 04/06/17 1022 04/08/17 1506 04/08/17 1516 04/08/17 2337  WBC 5.5  --  9.3  --  6.4  CREATININE  --  0.9 0.78  --  0.55  LATICACIDVEN  --   --   --  1.54  --     Estimated Creatinine Clearance: 44.3 mL/min (by C-G formula based on SCr of 0.55 mg/dL).    Allergies  Allergen Reactions  . Macrodantin [Nitrofurantoin]   . Morphine And Related Other (See Comments)    Altered mental state   . Sulfa Antibiotics Other (See Comments)    Reaction: unknown    Antimicrobials this admission: Vancomycin 04/08/2017 >> Cefepime 04/08/2017 >>   Dose adjustments this admission: -  Microbiology results: pending  Thank you for allowing pharmacy to be a part of this patient's care.  Rebecca [name] 04/09/2017 1:05 AM

## 2017-04-09 NOTE — Progress Notes (Signed)
Anora treatment not given, will start in AM/

## 2017-04-09 NOTE — Progress Notes (Signed)
[facility] PROGRESS NOTE  Rebecca [name]  IDP:[id number] DOB: Oct 08, 1927 DOA: 04/08/2017 PCP: [name], MD Brief Narrative: Rebecca [name] is a 83 y.o. female with a history of dementia, pulmonary fibrosis on 3L oxygen, emphysema, squamous cell CA w/lingual mass, and stage IIa NSCLC on chemotherapy with weekly carboplatin, paclitaxel s/p 4 cycles and XRT who was sent to the ED from her assisted living facility for a temperature of 100.104F following radiation therapy. On evaluation, temperature was 99.104F , MAPs near 60-65mmHg.   Work up including CXR, UA and labs showed no evidence of infection. Lactic acid normal. She has chronic chest wall pain and troponins have been normal. Though with persistent hypotension, IVF's and broad spectrum antibiotics were administered.   Subjective: History limited by pt's confusion due to chronic dementia. Has been refusing nursing care and interventions in ED, waiting on SDU bed as of time of my assessment.   Objective: BP (!) 85/37 (BP Location: Right Arm)   Pulse 67   Temp 98.2 F (36.8 C) (Oral)   Resp (!) 28   Ht 5\' 7"  (1.702 m)   Wt 69.6 kg (153 lb 7 oz)   SpO2 98%   BMI 24.03 kg/m   Gen: Nontoxic elderly female in no distress Pulm: Good air movement with velcro type scattered nondependent crackles and nonlabored on 1LPM  CV: RRR, no murmur, no JVD, no edema GI: Soft, NT, ND, +BS  Neuro: Alert, not oriented. No focal deficits. Ext: Warm, no deformities. Circumference of upper arms 22cm.   Assessment & Plan: Hypotension: Unclear etiology. With chemotherapy patient and history of fever, concern was primarily for sepsis, treated empirically as no bacterial source has been found. This is less likely in absence of concomitant tachycardia. May also be due to malignancy. Review of EMR shows persistent hypotension as an outpatient as well with BPs as low as 101/54 and as high as 121/65 recently.  - Change to smaller cuff size, regular cuff  indicated for upper arm circumference >25cm, and her arms are 22cm in greatest diameter. This could be falsely lowering readings.  - Monitor culture data and continue broad spectrum antibiotics given immunosuppressed status. If stable 11/23, may observe off antibiotics.   Chest wall pain: Seems to be a chronic condition, thus far ruling out for ACS.  - Monitor  Chronic hypoxic respiratory failure due to pulmonary fibrosis: Started O2 earlier this year, follow up Dr. Ashok Cordia. - Continue 3L O2 by nasal cannula and nebulizers  Stage IIa NSCLC: Actively undergoing chemotherapy and radiation.  - Follow up with oncology as outpatient. Dr. Julien Nordmann added to treatment team - PT/OT  Dementia: Uncertain of type, though this is chronic.  - Continue aricept - Delirium precautions - Discussed plan of care with Son by phone today  Hypothyroidism: Chronic, last TSH within range.  - Continue synthroid.   Thrombocytopenia and anemia: Suspect related to chemotherapy.  - Monitor  Rebecca Gather, MD [facility] Pager [phone]  If 7PM-7AM, please contact night-coverage [email or web address] Password [facility] 04/09/2017, 1:37 PM

## 2017-04-09 NOTE — ED Notes (Signed)
Pt keeps removing herself from cardiac monitor, O2 monitor, and blood pressure cuff.

## 2017-04-10 ENCOUNTER — Encounter: Payer: Self-pay | Admitting: Internal Medicine

## 2017-04-10 LAB — CBC
HCT: 28 % — ABNORMAL LOW (ref 36.0–46.0)
Hemoglobin: 9.2 g/dL — ABNORMAL LOW (ref 12.0–15.0)
MCH: 33.3 pg (ref 26.0–34.0)
MCHC: 32.9 g/dL (ref 30.0–36.0)
MCV: 101.4 fL — AB (ref 78.0–100.0)
PLATELETS: 70 10*3/uL — AB (ref 150–400)
RBC: 2.76 MIL/uL — AB (ref 3.87–5.11)
RDW: 14.5 % (ref 11.5–15.5)
WBC: 6.4 10*3/uL (ref 4.0–10.5)

## 2017-04-10 LAB — URINE CULTURE: Culture: NO GROWTH

## 2017-04-10 MED ORDER — SODIUM CHLORIDE 0.9 % IV BOLUS (SEPSIS)
1000.0000 mL | Freq: Once | INTRAVENOUS | Status: AC
  Administered 2017-04-10: 500 mL via INTRAVENOUS

## 2017-04-10 NOTE — Care Management Note (Signed)
Case Management Note  Patient Details  Name: Rebecca [name] MRN: [id number] Date of Birth: 11/08/1927  Subjective/Objective:                  Hypotension and sepsis  Action/Plan: Date: April 10, 2017 Velva Harman, BSN, [place], [place] Chart and notes review for patient progress and needs. Will follow for case management and discharge needs. Next review date: [id number]  Expected Discharge Date:  (unknown)               Expected Discharge Plan:  Assisted Living / Rest Home  In-House Referral:  Clinical Social Work  Discharge planning Services  CM Consult  Post Acute Care Choice:    Choice offered to:     DME Arranged:    DME Agency:     HH Arranged:    [place] Agency:     Status of Service:  In process, will continue to follow  If discussed at Long Length of Stay Meetings, dates discussed:    Additional Comments:  Leeroy Cha, RN 04/10/2017, 8:43 AM

## 2017-04-10 NOTE — Progress Notes (Signed)
Patient was transferred from ICU; alert and oriented x2; on O2= 2L; vital sign was taken; hypotension. No pain complained. Will continue to monitor.

## 2017-04-10 NOTE — Evaluation (Signed)
Physical Therapy Evaluation Patient Details Name: Rebecca [name] MRN: [id number] DOB: 02-15-1928 Today's Date: 04/10/2017   History of Present Illness  Pt admitted through ED from [facility] with fever, c/p and hypotension.  Pt with hx of SZ, Bil TKR, emphysema, Lung CA (currently on chemo), dementia and Chronic resp failure 2* pulmonary fibrosis  Clinical Impression  Pt admitted as above and presenting with functional mobility limitations 2* generalized weakness, balance deficits and dementia related cognition and questionable safety awareness.  Pt would benefit from follow up rehab at SNF level to maximize IND and safety for return to ALF at [facility]    Follow Up Recommendations SNF    Equipment Recommendations  None recommended by PT    Recommendations for Other Services       Precautions / Restrictions Precautions Precautions: Fall Restrictions Weight Bearing Restrictions: No      Mobility  Bed Mobility Overal bed mobility: Needs Assistance Bed Mobility: Supine to Sit;Sit to Supine     Supine to sit: Min assist;Mod assist Sit to supine: Min assist   General bed mobility comments: Assist with LEs and to bring trunk to upright  Transfers Overall transfer level: Needs assistance Equipment used: Rolling walker (2 wheeled) Transfers: Sit to/from Stand Sit to Stand: Min assist;+2 safety/equipment         General transfer comment: cues for safety awareness and use of UEs to self assist  Ambulation/Gait Ambulation/Gait assistance: Min assist;+2 physical assistance;+2 safety/equipment Ambulation Distance (Feet): 8 Feet(and 4') Assistive device: Rolling walker (2 wheeled) Gait Pattern/deviations: Step-through pattern;Decreased step length - right;Decreased step length - left;Shuffle Gait velocity: decr Gait velocity interpretation: Below normal speed for age/gender General Gait Details: cues for posture and position from RW.  Pt unsteady on feet  requiring constant min assist for balance/safety  Stairs            Wheelchair Mobility    Modified Rankin (Stroke Patients Only)       Balance Overall balance assessment: Needs assistance Sitting-balance support: No upper extremity supported;Feet supported Sitting balance-Leahy Scale: Fair     Standing balance support: Bilateral upper extremity supported Standing balance-Leahy Scale: Poor                               Pertinent Vitals/Pain Pain Assessment: No/denies pain    Home Living Family/patient expects to be discharged to:: Skilled nursing facility                 Additional Comments: Lives at [facility] in ALF     Prior Function Level of [place]: Needs assistance   Gait / Transfers Assistance Needed: ambulatory with a RW  ADL's / Homemaking Assistance Needed: Pt unsure        Hand Dominance        Extremity/Trunk Assessment   Upper Extremity Assessment Upper Extremity Assessment: Generalized weakness    Lower Extremity Assessment Lower Extremity Assessment: Generalized weakness    Cervical / Trunk Assessment Cervical / Trunk Assessment: Kyphotic  Communication   Communication: HOH  Cognition Arousal/Alertness: Awake/alert Behavior During Therapy: Impulsive Overall Cognitive Status: History of cognitive impairments - at baseline                                        General Comments      Exercises  Assessment/Plan    PT Assessment Patient needs continued PT services  PT Problem List Decreased strength;Decreased range of motion;Decreased activity tolerance;Decreased balance;Decreased mobility;Decreased cognition;Decreased knowledge of use of DME;Decreased safety awareness       PT Treatment Interventions DME instruction;Gait training;Functional mobility training;Therapeutic activities;Therapeutic exercise;Balance training;Cognitive remediation;Patient/family education    PT  Goals (Current goals can be found in the Care Plan section)  Acute Rehab PT Goals Patient Stated Goal: Get some lotion on my face PT Goal Formulation: With patient Time For Goal Achievement: 04/24/17 Potential to Achieve Goals: Fair    Frequency Min 3X/week   Barriers to discharge        Co-evaluation               AM-PAC PT "6 Clicks" Daily Activity  Outcome Measure Difficulty turning over in bed (including adjusting bedclothes, sheets and blankets)?: Unable Difficulty moving from lying on back to sitting on the side of the bed? : Unable Difficulty sitting down on and standing up from a chair with arms (e.g., wheelchair, bedside commode, etc,.)?: Unable Help needed moving to and from a bed to chair (including a wheelchair)?: A Little Help needed walking in hospital room?: A Little Help needed climbing 3-5 steps with a railing? : A Lot 6 Click Score: 11    End of Session Equipment Utilized During Treatment: Gait belt;Oxygen Activity Tolerance: Patient limited by fatigue Patient left: in bed;with call bell/phone within reach;with nursing/sitter in room Nurse Communication: Mobility status PT Visit Diagnosis: Unsteadiness on feet (R26.81);Muscle weakness (generalized) (M62.81);Difficulty in walking, not elsewhere classified (R26.2)    Time: 6160-7371 PT Time Calculation (min) (ACUTE ONLY): 34 min   Charges:   PT Evaluation $PT Eval Moderate Complexity: 1 Mod PT Treatments $Therapeutic Activity: 8-22 mins   PT G Codes:        Pg [phone]   Ioannis Schuh 04/10/2017, 4:24 PM

## 2017-04-10 NOTE — Progress Notes (Signed)
PT Cancellation Note  Patient Details Name: MARGURETE GUAMAN MRN: [id number] DOB: 1927/10/05   Cancelled Treatment:     PT order received but eval deferred - pt BP 94/57.  Will follow.   Damyra Luscher 04/10/2017, 2:25 PM

## 2017-04-10 NOTE — Progress Notes (Addendum)
[facility] PROGRESS NOTE  [name]  EYC:[id number] DOB: [birth year] DOA: 04/08/2017 PCP: [name], MD Brief Narrative: Rebecca [name] is a 83 y.o. female with a history of dementia, pulmonary fibrosis on 3L oxygen, emphysema, squamous cell CA w/lingual mass, and stage IIa NSCLC on chemotherapy with weekly carboplatin, paclitaxel s/p 4 cycles and XRT who was sent to the ED from her assisted living facility for a temperature of 100.48F following radiation therapy. On evaluation, temperature was 99.48F , MAPs near 60-16mmHg.   Work up including CXR, UA and labs showed no evidence of infection. Lactic acid normal. She has chronic chest wall pain and troponins have been normal. Though with persistent hypotension, IVF's and broad spectrum antibiotics were administered.   Subjective: Pt confused this morning, no events reported overnight.   Objective: BP (!) 98/56 (BP Location: Left Arm)   Pulse 61   Temp 98.6 F (37 C) (Axillary)   Resp (!) 22   Ht 5\' 7"  (1.702 m)   Wt 69.6 kg (153 lb 7 oz)   SpO2 96%   BMI 24.03 kg/m   Gen: Nontoxic elderly female in no distress, sleeping soundly Pulm: Good air movement with mild crackles, nonlabored.  CV: RRR, no murmur, no JVD, no edema GI: Soft, NT, ND, +BS  Neuro: Alert, not oriented, unable to complete most sentences. No focal deficits.  Assessment & Plan: Hypotension: Unclear etiology. With chemotherapy patient and history of fever, concern was primarily for sepsis, treated empirically as no bacterial source has been found. This is less likely in absence of concomitant tachycardia. May also be due to malignancy. Review of EMR shows persistent hypotension as an outpatient as well with BPs as low as 101/54 and as high as 121/65 recently. Cortisol appropriately 15.5. CXR without infiltrate. Lactate normal. Urine culture negative, blood cultures NGTD, no fever, no leukocytosis. - Improved with correct cuff size. - Will give additional  fluid this AM, monitor I/O. - DC antibiotics and monitor.  Chest wall pain: Seems to be a chronic condition, ruled out for ACS.   Chronic hypoxic respiratory failure due to pulmonary fibrosis: Started O2 earlier this year, follow up Dr. Ashok Cordia. - Continue 3L O2 by nasal cannula, ellipta, and nebulizers  Stage IIa NSCLC: Actively undergoing chemotherapy and radiation.  - Follow up with oncology as outpatient. Dr. Julien Nordmann added to treatment team. - PT/OT (from SNF), up w/assistance with RW  Dementia with behavioral disturbance: Uncertain of type, though this is chronic.  - Continue aricept, namenda, seroquel, clonazepam - Delirium precautions  Hypothyroidism: Chronic, last TSH within range.  - Continue synthroid   Seizure disorder: Stable - Continue dilantin.   GERD: Chronic, stable.  - Continue PPI.  Thrombocytopenia and anemia: Suspect related to chemotherapy.  - Monitor  Disposition: Monitor x24 hours off abx, monitoring cultures. If BPs stable and continues to have no evidence of bacterial infection, plan discharge back to [facility] 11/24. Discussed plan of care with Son by phone again today  Vance Gather, MD [facility] Pager [phone]  If 7PM-7AM, please contact night-coverage [email or web address] Password [facility] 04/10/2017, 11:46 AM

## 2017-04-11 DIAGNOSIS — C3412 Malignant neoplasm of upper lobe, left bronchus or lung: Secondary | ICD-10-CM

## 2017-04-11 DIAGNOSIS — R05 Cough: Secondary | ICD-10-CM

## 2017-04-11 DIAGNOSIS — T17920A Food in respiratory tract, part unspecified causing asphyxiation, initial encounter: Secondary | ICD-10-CM

## 2017-04-11 LAB — CBC
HEMATOCRIT: 29.3 % — AB (ref 36.0–46.0)
HEMOGLOBIN: 9.7 g/dL — AB (ref 12.0–15.0)
MCH: 33.8 pg (ref 26.0–34.0)
MCHC: 33.1 g/dL (ref 30.0–36.0)
MCV: 102.1 fL — ABNORMAL HIGH (ref 78.0–100.0)
Platelets: 79 10*3/uL — ABNORMAL LOW (ref 150–400)
RBC: 2.87 MIL/uL — ABNORMAL LOW (ref 3.87–5.11)
RDW: 14.4 % (ref 11.5–15.5)
WBC: 3.8 10*3/uL — ABNORMAL LOW (ref 4.0–10.5)

## 2017-04-11 MED ORDER — LIP MEDEX EX OINT
TOPICAL_OINTMENT | CUTANEOUS | Status: AC
  Administered 2017-04-11: 19:00:00
  Filled 2017-04-11: qty 7

## 2017-04-11 NOTE — Evaluation (Signed)
Clinical/Bedside Swallow Evaluation Patient Details  Name: Rebecca [name] MRN: [id number] Date of Birth: 1927-08-02  Today's Date: 04/11/2017 Time: SLP Start Time (ACUTE ONLY): 18 SLP Stop Time (ACUTE ONLY): 1328 SLP Time Calculation (min) (ACUTE ONLY): 16 min  Past Medical History:  Past Medical History:  Diagnosis Date  . Anemia, unspecified 03/18/2011  . Anxiety state, unspecified 01/2011  . Blood in stool 06/15/2012  . Corns and callosities 07/01/2011  . Dementia   . Diverticulosis 02/2011  . Dizziness   . DJD (degenerative joint disease)   . Dysuria 06/17/2011  . Hemorrhoids 02/2011  . Low back pain   . Lung cancer ([place])    left  . Mitral valve problem thickened   thickened  . Osteoarthritis of both hands 10/09/2015  . Osteoporosis 02/2011  . Other abnormal blood chemistry 0/30/2012  . Other acquired deformity of toe 12/16/2011  . Pain in joint, site unspecified 01/2012  . 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 Sacroiliitis, not elsewhere classified ([place]) 05/2011  . Seizures ([place])    Remotely  . Unspecified hypothyroidism 01/2011  . 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 Vitamin D deficiency 01/2011   Past Surgical History:  Past Surgical History:  Procedure Laterality Date  . bletheroplasty    . CATARACT EXTRACTION W/ INTRAOCULAR LENS  IMPLANT, BILATERAL  2010  . CERVICAL LAMINECTOMY  2005  . CYSTOSCOPY WITH RETROGRADE PYELOGRAM, URETEROSCOPY AND STENT PLACEMENT Bilateral 11/16/2016   Procedure: CYSTOSCOPY WITH RETROGRADE PYELOGRAMS/CLOT EVACUATION;  Surgeon: Irine Seal, MD;  Location: [facility];  Service: Urology;  Laterality: Bilateral;  . HAMMER TOE SURGERY  2013   right 2nd toe Dr. Mallie Mussel  . IVC FILTER PLACEMENT ([facility] HX)    . JOINT REPLACEMENT Bilateral 2002 Right, 1992 Left   knees  . TRANSURETHRAL RESECTION OF BLADDER TUMOR N/A 11/16/2016   Procedure: TRANSURETHRAL RESECTION OF BLADDER TUMOR (TURBT);  Surgeon:  Irine Seal, MD;  Location: [facility];  Service: Urology;  Laterality: N/A;   HPI:  83 years old white female with unresectable stage II a non-small cell lung cancer, squamous cell carcinoma and was undergoing a course of concurrent chemoradiation with weekly carboplatin and paclitaxel status post 5 cycles. The patient has been tolerating this treatment well.   Assessment / Plan / Recommendation Clinical Impression  Patient presents with a normal oropharyngeal swallowing function with testing pos based on bedside skilled observation. Did note episode of strong coughing 1-2 minutes following po intake which RN reports was observed towards the end of patient's previous meal raising concern for esophageal component with possible reflux induced cough vs retrograde flow of bolus with decreased airway protection, particularly given course of radiation treatment for non-small cell lung cancer. Given inconsistencies in po tolerance observed and reported, as well as decline in respiratory status, recommend MBS to evaluate function and ensure least restrictive diet. Plan for MBS 11/25 am.   SLP Visit Diagnosis: Dysphagia, unspecified (R13.10)    Aspiration Risk  Moderate aspiration risk    Diet Recommendation Dysphagia 3 (Mech soft);Thin liquid   Liquid Administration via: No straw;Cup(to limit bolus size and rate) Medication Administration: Crushed with puree Supervision: Patient able to self feed;Full supervision/cueing for compensatory strategies Compensations: Slow rate;Small sips/bites;Follow solids with liquid Postural Changes: Remain upright for at least 30 minutes after po intake;Seated upright at 90 degrees    Other  Recommendations Oral Care Recommendations: Oral care BID   Follow up Recommendations (TBD)  Frequency and Duration            Prognosis Barriers to Reach Goals: Cognitive deficits      Swallow Study   General HPI: 83 years old white female with unresectable stage II  a non-small cell lung cancer, squamous cell carcinoma and was undergoing a course of concurrent chemoradiation with weekly carboplatin and paclitaxel status post 5 cycles. The patient has been tolerating this treatment well. Type of Study: Bedside Swallow Evaluation Previous Swallow Assessment: none noted Diet Prior to this Study: Regular;Thin liquids Temperature Spikes Noted: No Respiratory Status: Nasal cannula History of Recent Intubation: No Behavior/Cognition: Alert;Cooperative;Requires cueing;Confused Oral Cavity Assessment: Within Functional Limits Oral Care Completed by SLP: Recent completion by staff Oral Cavity - Dentition: Dentures, top;Dentures, bottom Vision: Functional for self-feeding Self-Feeding Abilities: Able to feed self Patient Positioning: Upright in bed Baseline Vocal Quality: Normal Volitional Cough: Congested;Strong Volitional Swallow: Able to elicit    Oral/Motor/Sensory Function Overall Oral Motor/Sensory Function: Within functional limits   Ice Chips Ice chips: Within functional limits Presentation: Spoon   Thin Liquid Thin Liquid: Within functional limits Presentation: Cup;Self Fed    Nectar Thick Nectar Thick Liquid: Not tested   Honey Thick Honey Thick Liquid: Not tested   Puree Puree: Within functional limits Presentation: Spoon   Solid   GO   Solid: Within functional limits Presentation: [place], [place] [phone]  [place] 04/11/2017,1:35 PM

## 2017-04-11 NOTE — Progress Notes (Signed)
Subjective: The patient is seen and examined today.  She continues to complain of significant dry cough likely secondary to aspiration while she is eating.  She denied having any fever or chills.  She has no nausea or vomiting.  She was admitted from skilled nursing facility with fever and chest pain.  The patient was undergoing treatment with concurrent chemoradiation with weekly carboplatin and paclitaxel status post 5 cycles and has been tolerating this treatment well.  Objective: Vital signs in last 24 hours: Temp:  [98.3 F (36.8 C)-98.7 F (37.1 C)] 98.4 F (36.9 C) (11/24 0607) Pulse Rate:  [58-83] 64 (11/24 0607) Resp:  [11-27] 18 (11/24 0607) BP: (79-111)/(43-74) 111/57 (11/24 0607) SpO2:  [93 %-100 %] 93 % (11/24 0743)  Intake/Output from previous day: 11/23 0701 - 11/24 0700 In: 2050 [P.O.:240; I.V.:1810] Out: -  Intake/Output this shift: No intake/output data recorded.  General appearance: alert, cooperative, fatigued and no distress Resp: wheezes bilaterally Cardio: regular rate and rhythm, S1, S2 normal, no murmur, click, rub or gallop GI: soft, non-tender; bowel sounds normal; no masses,  no organomegaly Extremities: extremities normal, atraumatic, no cyanosis or edema  Lab Results:  Recent Labs    04/10/17 0302 04/11/17 0636  WBC 6.4 3.8*  HGB 9.2* 9.7*  HCT 28.0* 29.3*  PLT 70* 79*   BMET Recent Labs    04/08/17 1506 04/08/17 2336 04/08/17 2337  NA 139 138  --   K 5.0 4.4  --   CL 104 110  --   CO2 28 22  --   GLUCOSE 157* 138*  --   BUN 33* 26*  --   CREATININE 0.78 0.55 0.55  CALCIUM 8.9 7.7*  --     Studies/Results: No results found.  Medications: I have reviewed the patient's current medications.  CODE STATUS: No CODE BLUE  Assessment/Plan: This is a very pleasant 83 years old white female with unresectable stage II a non-small cell lung cancer, squamous cell carcinoma and was undergoing a course of concurrent chemoradiation with  weekly carboplatin and paclitaxel status post 5 cycles.  The patient has been tolerating this treatment well. She is feeling better today except for the persistent cough secondary to aspiration. I would strongly recommend for the patient to have a swallow study to evaluate her aspiration.  She may also benefit from repeat chest x-ray before discharge to rule out any aspiration pneumonia. For the dry cough, I will start the patient on Hycodan 5 mL p.o. every 6 hours as needed for cough. I will cancel his last dose of chemotherapy scheduled next week. We will arrange for the patient follow-up appointment at the cancer center for reevaluation after her discharge. Thank you so much for taking good care of Rebecca [name].  I will continue to follow the patient with you and assist in her management on as-needed basis.   LOS: 2 days    Eilleen Kempf 04/11/2017

## 2017-04-11 NOTE — Progress Notes (Signed)
[facility] PROGRESS NOTE  Rebecca [name]  JGO:[id number] DOB: Dec 27, 1927 DOA: 04/08/2017 PCP: Rebecca Serve, MD Brief Narrative: Rebecca [name] is a 83 y.o. female with a history of dementia, pulmonary fibrosis on 3L oxygen, emphysema, squamous cell CA w/lingual mass, and stage IIa NSCLC on chemotherapy with weekly carboplatin, paclitaxel s/p 4 cycles and XRT who was sent to the ED from her assisted living facility for a temperature of 100.90F following radiation therapy. On evaluation, temperature was 99.90F , MAPs near 60-9mmHg.   Work up including CXR, UA and labs showed no evidence of infection. Lactic acid normal. She has chronic chest wall pain and troponins have been normal. Though with persistent hypotension, IVF's and broad spectrum antibiotics were administered.   Subjective: Pt is alert, recalls parts of the story about her son's wife who had knee surgery (thought initially it was her niece). Had witnessed choking episode with thin liquids this morning per RN. No fevers. Denies dyspnea, cough.   Objective: BP (!) 112/54 (BP Location: Left Arm)   Pulse 61   Temp 98.1 F (36.7 C) (Oral)   Resp 18   Ht 5\' 7"  (1.702 m)   Wt 69.6 kg (153 lb 7 oz)   SpO2 96%   BMI 24.03 kg/m   Gen: Nontoxic elderly female in no distress witting up in bed. Pulm: Good air movement bibasilar crackles faintly heard. CV: RRR, no murmur, no JVD, no edema GI: Soft, NT, ND, +BS  Neuro: Alert, oriented to hospital and self. No focal deficits in cranial or peripheral nerves.  Assessment & Plan: Hypotension: Unclear etiology. With chemotherapy patient and history of fever, concern was primarily for sepsis, treated empirically as no bacterial source has been found. This is less likely in absence of concomitant tachycardia. May also be due to malignancy. Review of EMR shows persistent hypotension as an outpatient as well with BPs as low as 101/54 and as high as 121/65 recently. Cortisol appropriately  15.5. CXR without infiltrate. Lactate normal. Urine culture negative, blood cultures NGTD, no fever, no leukocytosis. - Improved with correct cuff size. Stable - continue monitoring off abx.  Chest wall pain: Seems to be a chronic condition, ruled out for ACS.   Chronic hypoxic respiratory failure due to pulmonary fibrosis: Started O2 earlier this year, follow up Dr. Ashok [name]. - Continue 3L O2 by nasal cannula, ellipta, and nebulizers  Stage IIa NSCLC: Actively undergoing chemotherapy and radiation.  - Follow up with oncology as outpatient. Dr. Julien [name] saw her 11/24. - PT/OT (from SNF), up w/assistance with RW.  Dementia with behavioral disturbance: Uncertain of type, though this is chronic.  - Continue aricept, namenda, seroquel, clonazepam - Delirium precautions  Dysphagia: Reported by son and Conservation officer, historic buildings.  - SLP evaluation, warrants MBSS so will get this in AM.  - Planning repeat CXR in AM  Hypothyroidism: Chronic, last TSH within range.  - Continue synthroid   Seizure disorder: Stable - Continue dilantin.   GERD: Chronic, stable.  - Continue PPI.  Thrombocytopenia and anemia: Suspect related to chemotherapy.  - Monitor  Disposition: Doing ok off antibiotics, planning SLP eval in AM, repeat CXR in AM. Possible DC back to [facility] 11/25. Discussed with Son.  Rebecca Gather, MD [facility] Pager [phone]  If 7PM-7AM, please contact night-coverage [email or web address] Password [email or web address] 04/11/2017, 5:18 PM

## 2017-04-12 ENCOUNTER — Inpatient Hospital Stay (HOSPITAL_COMMUNITY): Payer: Medicare Other

## 2017-04-12 DIAGNOSIS — C3492 Malignant neoplasm of unspecified part of left bronchus or lung: Secondary | ICD-10-CM

## 2017-04-12 DIAGNOSIS — J849 Interstitial pulmonary disease, unspecified: Secondary | ICD-10-CM

## 2017-04-12 DIAGNOSIS — K219 Gastro-esophageal reflux disease without esophagitis: Secondary | ICD-10-CM

## 2017-04-12 DIAGNOSIS — F0391 Unspecified dementia with behavioral disturbance: Secondary | ICD-10-CM

## 2017-04-12 DIAGNOSIS — G40909 Epilepsy, unspecified, not intractable, without status epilepticus: Secondary | ICD-10-CM

## 2017-04-12 DIAGNOSIS — R1310 Dysphagia, unspecified: Secondary | ICD-10-CM

## 2017-04-12 DIAGNOSIS — E039 Hypothyroidism, unspecified: Secondary | ICD-10-CM

## 2017-04-12 MED ORDER — DM-GUAIFENESIN ER 30-600 MG PO TB12
1.0000 | ORAL_TABLET | Freq: Two times a day (BID) | ORAL | Status: DC
  Filled 2017-04-12: qty 1

## 2017-04-12 MED ORDER — CLONAZEPAM 0.5 MG PO TABS: 0.5000 mg | ORAL_TABLET | Freq: Every day | ORAL | 0 refills | Status: DC

## 2017-04-12 MED ORDER — GUAIFENESIN-DM 100-10 MG/5ML PO SYRP
5.0000 mL | ORAL_SOLUTION | ORAL | Status: DC | PRN
  Administered 2017-04-12: 5 mL via ORAL
  Filled 2017-04-12: qty 10

## 2017-04-12 NOTE — Clinical Social Work Note (Signed)
Clinical Social Work Assessment  Patient Details  Name: Rebecca [name] MRN: [id number] Date of Birth: 1927/06/02  Date of referral:  04/12/17               Reason for consult:  (pt admitted from facility)                Permission sought to share information with:  Family Supports, Facility Sport and exercise psychologist Permission granted to share information::  Yes, Verbal Permission Granted  Name::     [phone] daughter Karna Christmas  Agency::  [facility]  Relationship::     Contact Information:     Housing/Transportation Living arrangements for the past 2 months:  Landa of Information:  Patient, Adult Children Patient Interpreter Needed:  None Criminal Activity/Legal Involvement Pertinent to Current Situation/Hospitalization:  No - Comment as needed Significant Relationships:  Adult Children, Warehouse manager Lives with:  Facility Resident Do you feel safe going back to the place where you live?  Yes Need for family participation in patient care:  Yes (Comment)  Care giving concerns:  Pt from [place]. Has lived there 2.5 years. No caregiving concerns notes. Currently requiring more assistance than at baseline- SNF recommended   Social Worker assessment / plan:  CSW consulted to assist with SNF placement. Pt lives at [facility] ALF but needs SNF at [place] for therapy and the increase in her assistance needs at this time. Was at SNF [employer] building) at [facility] 11/2016, returned to her ALF apartment following completing rehab and has been there until this hospital admission. Pt confused but pleasant- she advised CSW speak with daughter about her plans. Daughter in agreement pt appropriate for SNF at [place] rather than ALF. CSW completed FL2- made referral to [place] (in case of bed availability at [facility]). Left voicemail with admissions with details.  Plan: SNF (healthcare building at [facility]) at [place].   Employment status:  Retired Forensic scientist:  Medicare PT Recommendations:  [place] / Referral to community resources:  [place]  Patient/Family's Response to care:  appreciative  Patient/Family's Understanding of and Emotional Response to Diagnosis, Current Treatment, and Prognosis:  Pt unable to demonstrate understanding other than location and person. (Dementia) Daughter demonstrates understanding of plan- familiar with process due to pt having been to [facility] SNF before.  Is relieved re: pt's plan for SNF rather than direct return to her ALF apartment at [place].  Emotional Assessment Appearance:  Appears stated age Attitude/Demeanor/Rapport:  (pleasant, confused) Affect (typically observed):  Calm Orientation:  Oriented to Self, Oriented to Place Alcohol / Substance use:    Psych involvement (Current and /or in the community):  No (Comment)  Discharge Needs  Concerns to be addressed:  Discharge Planning Concerns, Care Coordination Readmission within the last 30 days:  No Current discharge risk:  None Barriers to Discharge:  No Barriers Identified   Nila Nephew, LCSW 04/12/2017, 11:59 AM  [phone] Weekend coverage

## 2017-04-12 NOTE — NC FL2 (Signed)
[place] MEDICAID FL2 LEVEL OF CARE SCREENING TOOL     IDENTIFICATION  Patient Name: Rebecca [name] Birthdate: 1928/05/18 Sex: female Admission Date (Current Location): 04/08/2017  [facility] and [place] Number:  Herbalist and Address:  [facility],  [address] [place], [place]      Provider Number: [id number]  Attending Physician Name and Address:  Patrecia Pour, MD  Relative Name and Phone Number:       Current Level of Care: Hospital Recommended Level of Care: [place] Prior Approval Number:    Date Approved/Denied:   PASRR Number: [id number]  Discharge Plan: SNF    Current Diagnoses: Patient Active Problem List   Diagnosis Date Noted  . Hypotension 04/08/2017  . Stage II squamous cell carcinoma of left lung ([place]) 02/25/2017  . Encounter for antineoplastic chemotherapy 02/25/2017  . Goals of care, counseling/discussion 02/25/2017  . Squamous NSCLC ([place]) 01/29/2017  . ILD (interstitial lung disease) ([place]) 11/28/2016  . Arthritis 11/28/2016  . Gross hematuria 11/16/2016  . GERD (gastroesophageal reflux disease) 05/20/2016  . Torus palatinus 04/18/2016  . Osteoarthritis, multiple sites 10/09/2015  . Back pain 06/01/2015  . Unstable gait 04/16/2015  . Abnormal finding on EKG-new inferior/ septal TWI 03/28/2015  . DNR no code (do not resuscitate) 03/28/2015  . DVT, femoral, acute ([place]) 03/28/2015  . Chronic pulmonary embolism ([place]) 03/28/2015  . Dyslipidemia 03/12/2015  . Seizure disorder ([place]) 03/11/2015  . Depression 09/26/2014  . Dementia with behavioral disturbance 09/19/2012  . Hypothyroidism 12/08/2006    Orientation RESPIRATION BLADDER Height & Weight     Self  O2(2L) Incontinent Weight: 153 lb 7 oz (69.6 kg) Height:  5\' 7"  (170.2 cm)  BEHAVIORAL SYMPTOMS/MOOD NEUROLOGICAL BOWEL NUTRITION STATUS      Continent Diet(dysphasia 3, thin fluid consistency)  AMBULATORY STATUS COMMUNICATION OF NEEDS Skin    Limited Assist Verbally Normal                       Personal Care Assistance Level of Assistance  Bathing, Feeding, Dressing Bathing Assistance: Limited assistance Feeding assistance: Limited assistance Dressing Assistance: Limited assistance     Functional Limitations Info  Sight, Hearing, Speech Sight Info: Adequate Hearing Info: Adequate Speech Info: Adequate    SPECIAL CARE FACTORS FREQUENCY  PT (By licensed PT), OT (By licensed OT)     PT Frequency: 5x OT Frequency: 5x            Contractures Contractures Info: Not present    Additional Factors Info  Code Status, Allergies Code Status Info: DNR Allergies Info: Macrodantin Nitrofurantoin, Morphine And Related, Sulfa Antibiotics           Current Medications (04/12/2017):  This is the current hospital active medication list Current Facility-Administered Medications  Medication Dose Route Frequency Provider Last Rate Last Dose  . 0.9 %  sodium chloride infusion   Intravenous Continuous Oswald Hillock, MD 75 mL/hr at 04/12/17 0501    . acetaminophen (TYLENOL) tablet 650 mg  650 mg Oral Q6H PRN Oswald Hillock, MD   650 mg at 04/08/17 2349  . clonazePAM (KLONOPIN) tablet 0.5 mg  0.5 mg Oral QHS PRN Patrecia Pour, MD   0.5 mg at 04/10/17 2124  . clonazePAM (KLONOPIN) tablet 0.5 mg  0.5 mg Oral Salome Spotted, MD   0.5 mg at 04/12/17 0855  . enoxaparin (LOVENOX) injection 40 mg  40 mg Subcutaneous QHS [place], Gagan S,  MD   40 mg at 04/11/17 2059  . ipratropium-albuterol (DUONEB) 0.5-2.5 (3) MG/3ML nebulizer solution 3 mL  3 mL Nebulization TID Patrecia Pour, MD   3 mL at 04/12/17 0807  . lactose free nutrition (Boost) liquid 237 mL  237 mL Oral Daily Patrecia Pour, MD   237 mL at 04/11/17 1638  . levothyroxine (SYNTHROID, LEVOTHROID) tablet 100 mcg  100 mcg Oral QAC breakfast Oswald Hillock, MD   100 mcg at 04/12/17 0855  . memantine (NAMENDA) tablet 10 mg  10 mg Oral BID Patrecia Pour, MD   10 mg at 04/12/17  0854  . ondansetron (ZOFRAN) tablet 4 mg  4 mg Oral Q6H PRN Oswald Hillock, MD   4 mg at 04/10/17 0022   Or  . ondansetron (ZOFRAN) injection 4 mg  4 mg Intravenous Q6H PRN Oswald Hillock, MD      . pantoprazole (PROTONIX) EC tablet 40 mg  40 mg Oral Daily Oswald Hillock, MD   40 mg at 04/12/17 0854  . phenytoin (DILANTIN) ER capsule 100 mg  100 mg Oral Daily Patrecia Pour, MD   100 mg at 04/12/17 0855  . phenytoin (DILANTIN) ER capsule 200 mg  200 mg Oral QHS Patrecia Pour, MD   200 mg at 04/11/17 2100  . prochlorperazine (COMPAZINE) tablet 10 mg  10 mg Oral Q6H PRN Patrecia Pour, MD      . QUEtiapine (SEROQUEL) tablet 12.5 mg  12.5 mg Oral QHS Darrick Meigs, Gagan S, MD   12.5 mg at 04/11/17 2100  . umeclidinium-vilanterol (ANORO ELLIPTA) 62.5-25 MCG/INH 1 puff  1 puff Inhalation Daily Patrecia Pour, MD   1 puff at 04/12/17 0807     Discharge Medications: Please see discharge summary for a list of discharge medications.  Relevant Imaging Results:  Relevant Lab Results:   Additional Information SSN [id number]  Nila Nephew, LCSW

## 2017-04-12 NOTE — Discharge Summary (Signed)
Physician Discharge Summary  Rebecca [name] YNW:[id number] DOB: 1928/04/05 DOA: 04/08/2017  PCP: [name], MD  Admit date: 04/08/2017 Discharge date: 04/12/2017  Admitted From: [place] Disposition:  [facility]  Recommendations for Outpatient Follow-up:  1. Follow up with PCP in 1-2 weeks  Home Health: Per SNF Equipment/Devices: None new, continue O2 at 3L/min Discharge Condition: Stable CODE STATUS: DNR Diet recommendation: Dysphagia 3 (mechanical soft), thin liquids.   Brief/Interim Summary: Rebecca [name] is a 83 y.o. female with a history of dementia, pulmonary fibrosis on 3L oxygen, emphysema, squamous cell CA w/lingual mass, and stage IIa NSCLC on chemotherapy with weekly carboplatin, paclitaxel s/p 4 cycles and XRT who was sent to the ED from her assisted living facility for a temperature of 100.66F following radiation therapy. On evaluation, temperature was 99.66F , MAPs near 60-95mmHg.   Work up including CXR, UA and labs showed no evidence of infection. Lactic acid normal. She has chronic chest wall pain and troponins have been normal. Though with persistent hypotension, IVF's and broad spectrum antibiotics were administered initially. These have been held since 11/22 without fever, cultures having remained negative. Blood pressure has improved. Due to concern for chronic aspiration, swallow evaluation with barium study was performed showing no evident aspiration. CXR on 11/25 demonstrated no infiltrates.   Physical and occupational therapy evaluations have been performed, recommending SNF at discharge.   Discharge Diagnoses:  Active Problems:   Hypothyroidism   Dementia with behavioral disturbance   Seizure disorder (HCC)   GERD (gastroesophageal reflux disease)   ILD (interstitial lung disease) (HCC)   Squamous NSCLC (HCC)   Hypotension  Hypotension: Unclear etiology. With chemotherapy patient and history of fever, concern was  primarily for sepsis, treated empirically as no bacterial source has been found. This is less likely in absence of concomitant tachycardia/fever. May also be due to malignancy. Review of EMR shows persistent hypotension as an outpatient as well with BPs as low as 101/54 and as high as 121/65 recently. Cortisol appropriately 15.5. CXR without infiltrate. Lactate normal. Urine culture negative, blood cultures NGTD, no fever, no leukocytosis. - Resolved with correct cuff size. - Continue monitoring off abx.  Chest wall pain: Seems to be a chronic condition, ruled out for ACS.   Chronic hypoxic respiratory failure due to pulmonary fibrosis: Started O2 earlier this year, follow up Dr. Ashok Cordia. CXR stable on day of discharge. - Continue 3L O2 by nasal cannula, ellipta, and nebulizers  Stage IIa NSCLC: Actively undergoing chemotherapy and radiation.  - Follow up with oncology as outpatient. Dr. Julien Nordmann saw her 11/24. - PT/OT recommends short term rehabilitation and up w/assistance with RW.  Dementia with behavioral disturbance: Uncertain of type, though this is chronic.  - Continue aricept, namenda, seroquel, clonazepam - Delirium precautions  Dysphagia: Reported by son and Conservation officer, historic buildings. Per SLP evaluation 11/25: "Pt's oral control, manipulation, mastication and transit were all within functional limits. Swallow was appropriately triggered at pt's valleculae with adequate laryngeal elevation, epiglottic inversion to protect airway and pharyngeal constriction. No laryngeal intrusion with any consistency and no post swallow residue. Esophagus briefly viewed without observation of residue across consistencies including barium pill. Recommend pt continue Dys 3 for energy conservation, thin liquids and pills with liquids. No further ST needed."  Hypothyroidism: Chronic, last TSH within range.  - Continue synthroid   Seizure disorder: Stable - Continue dilantin.   GERD: Chronic, stable.  - Continue  PPI.  Thrombocytopenia and anemia: Suspect related to  chemotherapy.  - Monitor  Discharge Instructions  Allergies as of 04/12/2017      Reactions   Macrodantin [nitrofurantoin]    Morphine And Related Other (See Comments)   Altered mental state    Sulfa Antibiotics Other (See Comments)   Reaction: unknown      Medication List    TAKE these medications   acetaminophen 325 MG tablet Commonly known as:  TYLENOL Take 650 mg by mouth 2 (two) times daily.   acetaminophen 325 MG tablet Commonly known as:  TYLENOL Take 650 mg by mouth 2 (two) times daily as needed for mild pain or fever.   CALCIUM 600-D 600-400 MG-UNIT Tabs Generic drug:  Calcium Carbonate-Vitamin D3 Take 1 tablet by mouth daily.   clonazePAM 0.5 MG tablet Commonly known as:  KLONOPIN Take 1 tablet (0.5 mg total) by mouth daily. and as needed at bedtime for agitation or anxiety What changed:    when to take this  reasons to take this  additional instructions  Another medication with the same name was removed. Continue taking this medication, and follow the directions you see here.   donepezil 10 MG tablet Commonly known as:  ARICEPT Take 10 mg by mouth. Take one tablet in evening for memory   DULoxetine 30 MG capsule Commonly known as:  CYMBALTA Take 30 mg by mouth daily.   lactose free nutrition Liqd Take 237 mLs by mouth daily.   levothyroxine 100 MCG tablet Commonly known as:  SYNTHROID, LEVOTHROID Take 1 tablet (100 mcg total) by mouth daily before breakfast. For thyroid   meloxicam 7.5 MG tablet Commonly known as:  MOBIC Take 7.5 mg by mouth daily as needed for pain.   memantine 10 MG tablet Commonly known as:  NAMENDA Take 10 mg by mouth 2 (two) times daily.   omega-3 acid ethyl esters 1 g capsule Commonly known as:  LOVAZA Take 1 g by mouth daily.   omeprazole 20 MG capsule Commonly known as:  PRILOSEC Take 20 mg by mouth at bedtime.   OXYGEN Inhale 2 L/min into the lungs as  needed. To keep O2 Sat >90%   phenytoin 100 MG ER capsule Commonly known as:  DILANTIN Take 200 mg by mouth at bedtime.   phenytoin 100 MG ER capsule Commonly known as:  DILANTIN Take 100 mg by mouth daily.   prochlorperazine 10 MG tablet Commonly known as:  COMPAZINE Take 1 tablet (10 mg total) by mouth every 6 (six) hours as needed for nausea or vomiting.   QUEtiapine 25 MG tablet Commonly known as:  SEROQUEL Take 12.5 mg by mouth daily. At 8 pm   SONAFINE Apply 1 application topically 3 (three) times daily. APPLY TO RADIATION DERMATITIS 3 TIMES DAILY, GENTLY MASSAGE AREA UNTIL ABSORBED, CONTINUE APPLICATION UNTIL SKIN HAS FULLY RECOVERED   THERA M PLUS PO Take 1 tablet by mouth daily.   umeclidinium-vilanterol 62.5-25 MCG/INH Aepb Commonly known as:  ANORO ELLIPTA Inhale 1 puff into the lungs daily.   Vitamin D3 5000 units Tabs Take 1 tablet by mouth daily. Take one tablet daily      Follow-up Information    [name], MD Follow up.   Specialty:  Internal Medicine Contact information: [place] [address] [phone]        Curt Bears, MD Follow up.   Specialty:  Oncology Contact information: [place] [address] [phone]          Allergies  Allergen  Reactions  . Macrodantin [Nitrofurantoin]   . Morphine And Related Other (See Comments)    Altered mental state   . Sulfa Antibiotics Other (See Comments)    Reaction: unknown    Consultations:  Oncology, Dr. Julien Nordmann  Procedures/Studies: Dg Chest 2 View  Result Date: 04/12/2017 CLINICAL DATA:  Productive cough. EXAM: CHEST  2 VIEW COMPARISON:  Radiographs of April 08, 2017. FINDINGS: Stable cardiomegaly. Stable interstitial densities are noted throughout both lungs most consistent with pulmonary fibrosis. Mildly increased bibasilar densities are noted concerning for subsegmental atelectasis or edema with mild associated pleural  effusions. Bony thorax is unremarkable. IMPRESSION: Stable findings concerning for pulmonary fibrosis. Mildly increased bibasilar densities are noted concerning for subsegmental atelectasis or edema with mild associated pleural effusions. Electronically Signed   By: Marijo Conception, M.D.   On: 04/12/2017 09:37   Dg Chest 2 View  Result Date: 04/08/2017 CLINICAL DATA:  Chest pain and lung cancer EXAM: CHEST  2 VIEW COMPARISON:  01/29/2017 FINDINGS: Low volume chest with coarse interstitial opacities. When accounting for lower volumes and interstitial crowding no acute superimposed opacity. No effusion or pneumothorax. Chronic cardiomegaly. Advanced disc degeneration with scoliosis. IMPRESSION: 1. Pulmonary fibrosis. Accounting for low lung volumes and interstitial crowding, no acute opacity identified. 2. A lingular nodule that was biopsied 01/29/2017 is not discretely visible. Electronically Signed   By: Monte Fantasia M.D.   On: 04/08/2017 15:52   Dg Swallowing Func-speech Pathology  Result Date: 04/12/2017 Objective Swallowing Evaluation: Type of Study: MBS-Modified Barium Swallow Study  Patient Details Name: MARYLYNNE KEELIN MRN: [id number] Date of Birth: 08-29-1927 Today's Date: 04/12/2017 Time: SLP Start Time (ACUTE ONLY): 0962 -SLP Stop Time (ACUTE ONLY): 0935 SLP Time Calculation (min) (ACUTE ONLY): 15 min Past Medical History: Past Medical History: Diagnosis Date . Anemia, unspecified 03/18/2011 . Anxiety state, unspecified 01/2011 . Blood in stool 06/15/2012 . Corns and callosities 07/01/2011 . Dementia  . Diverticulosis 02/2011 . Dizziness  . DJD (degenerative joint disease)  . Dysuria 06/17/2011 . Hemorrhoids 02/2011 . Low back pain  . Lung cancer ([place])   left . Mitral valve problem thickened  thickened . Osteoarthritis of both hands 10/09/2015 . Osteoporosis 02/2011 . Other abnormal blood chemistry 0/30/2012 . Other acquired deformity of toe 12/16/2011 . Pain in joint, site unspecified 01/2012 .  Personality change due to conditions classified elsewhere 01/2011 . Sacroiliitis, not elsewhere classified ([place]) 05/2011 . Seizures ([place])   Remotely . Unspecified hypothyroidism 01/2011 . 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 Vitamin D deficiency 01/2011 Past Surgical History: Past Surgical History: Procedure Laterality Date . bletheroplasty   . CATARACT EXTRACTION W/ INTRAOCULAR LENS  IMPLANT, BILATERAL  2010 . CERVICAL LAMINECTOMY  2005 . CYSTOSCOPY WITH RETROGRADE PYELOGRAM, URETEROSCOPY AND STENT PLACEMENT Bilateral 11/16/2016  Procedure: CYSTOSCOPY WITH RETROGRADE PYELOGRAMS/CLOT EVACUATION;  Surgeon: Irine Seal, MD;  Location: [facility];  Service: Urology;  Laterality: Bilateral; . HAMMER TOE SURGERY  2013  right 2nd toe Dr. Mallie Mussel . IVC FILTER PLACEMENT ([facility] HX)   . JOINT REPLACEMENT Bilateral 2002 Right, 1992 Left  knees . TRANSURETHRAL RESECTION OF BLADDER TUMOR N/A 11/16/2016  Procedure: TRANSURETHRAL RESECTION OF BLADDER TUMOR (TURBT);  Surgeon: Irine Seal, MD;  Location: [facility];  Service: Urology;  Laterality: N/A; HPI: 83 years old white female with unresectable stage II a non-small cell lung cancer, squamous cell carcinoma and was undergoing a course of concurrent chemoradiation with weekly carboplatin and paclitaxel status post 5 cycles. Admitted with complaints  of chest pain. CXR 11/21 Pulmonary fibrosis. Accounting for low lung volumes and interstitial crowding, no acute opacity identified. A lingular nodule that was biopsied 01/29/2017 is not discretely visible.  No Data Recorded Assessment / Plan / Recommendation CHL IP CLINICAL IMPRESSIONS 04/12/2017 Clinical Impression Pt's oral control, manipulation, mastication and transit were all within functional limits. Swallow was appropriately triggered at pt's valleculae with adequate laryngeal elevation, epiglottic inversion to protect airway and pharyngeal constriction. No laryngeal intrusion with any consistency and no post swallow  residue. Esophagus briefly viewed without observation of residue across consistencies including barium pill. Recommend pt continue Dys 3 for energy conservation, thin liquids and pills with liquids. No further ST needed.  SLP Visit Diagnosis Dysphagia, unspecified (R13.10) Attention and concentration deficit following -- Frontal lobe and executive function deficit following -- Impact on safety and function Mild aspiration risk   CHL IP TREATMENT RECOMMENDATION 04/12/2017 Treatment Recommendations No treatment recommended at this time   Prognosis 04/12/2017 Prognosis for Safe Diet Advancement -- Barriers to Reach Goals Cognitive deficits Barriers/Prognosis Comment -- CHL IP DIET RECOMMENDATION 04/12/2017 SLP Diet Recommendations Dysphagia 3 (Mech soft) solids;Thin liquid Liquid Administration via Straw;Cup Medication Administration Whole meds with liquid Compensations Slow rate;Small sips/bites Postural Changes Seated upright at 90 degrees;Remain semi-upright after after feeds/meals (Comment)   CHL IP OTHER RECOMMENDATIONS 04/12/2017 Recommended Consults -- Oral Care Recommendations Oral care BID Other Recommendations --   CHL IP FOLLOW UP RECOMMENDATIONS 04/12/2017 Follow up Recommendations None   No flowsheet data found.     CHL IP ORAL PHASE 04/12/2017 Oral Phase WFL Oral - Pudding Teaspoon -- Oral - Pudding Cup -- Oral - Honey Teaspoon -- Oral - Honey Cup -- Oral - Nectar Teaspoon -- Oral - Nectar Cup -- Oral - Nectar Straw -- Oral - Thin Teaspoon -- Oral - Thin Cup -- Oral - Thin Straw -- Oral - Puree -- Oral - Mech Soft -- Oral - Regular -- Oral - Multi-Consistency -- Oral - Pill -- Oral Phase - Comment --  CHL IP PHARYNGEAL PHASE 04/12/2017 Pharyngeal Phase WFL Pharyngeal- Pudding Teaspoon -- Pharyngeal -- Pharyngeal- Pudding Cup -- Pharyngeal -- Pharyngeal- Honey Teaspoon -- Pharyngeal -- Pharyngeal- Honey Cup -- Pharyngeal -- Pharyngeal- Nectar Teaspoon -- Pharyngeal -- Pharyngeal- Nectar Cup -- Pharyngeal  -- Pharyngeal- Nectar Straw -- Pharyngeal -- Pharyngeal- Thin Teaspoon -- Pharyngeal -- Pharyngeal- Thin Cup -- Pharyngeal -- Pharyngeal- Thin Straw -- Pharyngeal -- Pharyngeal- Puree -- Pharyngeal -- Pharyngeal- Mechanical Soft -- Pharyngeal -- Pharyngeal- Regular -- Pharyngeal -- Pharyngeal- Multi-consistency -- Pharyngeal -- Pharyngeal- Pill -- Pharyngeal -- Pharyngeal Comment --  CHL IP CERVICAL ESOPHAGEAL PHASE 04/12/2017 Cervical Esophageal Phase WFL Pudding Teaspoon -- Pudding Cup -- Honey Teaspoon -- Honey Cup -- Nectar Teaspoon -- Nectar Cup -- Nectar Straw -- Thin Teaspoon -- Thin Cup -- Thin Straw -- Puree -- Mechanical Soft -- Regular -- Multi-consistency -- Pill -- Cervical Esophageal Comment -- No flowsheet data found. [place] Siren 04/12/2017, 9:47 AM Orbie Pyo Colvin Caroli.Ed CCC-SLP Pager [phone]              Subjective: No dyspnea or chest pain. Denies lightheadedness when standing.   Discharge Exam: Vitals:   04/12/17 0633 04/12/17 0808  BP: (!) 147/53   Pulse: 78   Resp: 18   Temp: 98.5 F (36.9 C)   SpO2: 93% 95%   Gen: Nontoxic elderly female in no distress, sitting in chair Pulm: Good air movement decreased at bases, nonlabored. CV: RRR, no  murmur, no JVD, no edema GI: Soft, NT, ND, +BS  Neuro: Alert, not oriented. No focal deficits.  Labs: Basic Metabolic Panel: Recent Labs  Lab 04/06/17 1022 04/08/17 1506 04/08/17 2336 04/08/17 2337  NA 142 139 138  --   K 4.4 5.0 4.4  --   CL  --  104 110  --   CO2 28 28 22   --   GLUCOSE 91 157* 138*  --   BUN 29.8* 33* 26*  --   CREATININE 0.9 0.78 0.55 0.55  CALCIUM 9.6 8.9 7.7*  --    Liver Function Tests: Recent Labs  Lab 04/06/17 1022 04/08/17 1506 04/08/17 2336  AST 23 25 18   ALT 24 21 16   ALKPHOS 72 64 51  BILITOT 0.25 0.5 0.7  PROT 7.1 6.9 5.8*  ALBUMIN 3.2* 3.4* 2.7*   CBC: Recent Labs  Lab 04/06/17 1022 04/08/17 1506 04/08/17 2337 04/10/17 0302 04/11/17 0636  WBC 5.5 9.3 6.4 6.4  3.8*  NEUTROABS 4.6 8.3*  --   --   --   HGB 11.6 10.9* 9.9* 9.2* 9.7*  HCT 35.1 32.9* 30.0* 28.0* 29.3*  MCV 99.7 100.3* 100.7* 101.4* 102.1*  PLT 110* 82* 71* 70* 79*   Cardiac Enzymes: Recent Labs  Lab 04/08/17 1506 04/08/17 2337 04/09/17 1138  TROPONINI <0.03 <0.03 <0.03   Urinalysis    Component Value Date/Time   COLORURINE YELLOW 04/08/2017 1914   APPEARANCEUR CLEAR 04/08/2017 1914   LABSPEC 1.024 04/08/2017 1914   PHURINE 5.0 04/08/2017 1914   GLUCOSEU NEGATIVE 04/08/2017 1914   HGBUR NEGATIVE 04/08/2017 [place] NEGATIVE 04/08/2017 1914   BILIRUBINUR n 08/08/2013 1608   [place] 04/08/2017 1914   PROTEINUR NEGATIVE 04/08/2017 1914   UROBILINOGEN 0.2 03/11/2015 1926   NITRITE NEGATIVE 04/08/2017 1914   LEUKOCYTESUR NEGATIVE 04/08/2017 1914    Microbiology Recent Results (from the past 240 hour(s))  Culture, blood (Routine x 2)     Status: None (Preliminary result)   Collection Time: 04/08/17  3:05 PM  Result Value Ref Range Status   Specimen Description BLOOD LEFT ANTECUBITAL  Final   Special Requests   Final    BOTTLES DRAWN AEROBIC AND ANAEROBIC Blood Culture adequate volume   Culture   Final    NO GROWTH 3 DAYS Performed at [facility], [place] [address], [place], [place] [address]    Report Status PENDING  Incomplete  Culture, blood (Routine x 2)     Status: None (Preliminary result)   Collection Time: 04/08/17  3:14 PM  Result Value Ref Range Status   Specimen Description BLOOD RIGHT ANTECUBITAL  Final   Special Requests   Final    BOTTLES DRAWN AEROBIC AND ANAEROBIC Blood Culture adequate volume   Culture   Final    NO GROWTH 3 DAYS Performed at [facility], [address], [place], [place] [address]    Report Status PENDING  Incomplete  Urine culture     Status: None   Collection Time: 04/08/17  7:14 PM  Result Value Ref Range Status   Specimen Description URINE, RANDOM  Final   Special Requests NONE  Final    Culture   Final    NO GROWTH Performed at [facility], [address], [place], [place] [address]    Report Status 04/10/2017 FINAL  Final  MRSA PCR Screening     Status: None   Collection Time: 04/09/17 11:40 AM  Result Value Ref  Range Status   MRSA by PCR NEGATIVE NEGATIVE Final    Comment:        The GeneXpert MRSA Assay (FDA approved for NASAL specimens only), is one component of a comprehensive MRSA colonization surveillance program. It is not intended to diagnose MRSA infection nor to guide or monitor treatment for MRSA infections.     Time coordinating discharge: Approximately 40 minutes  Vance Gather, MD  [facility] 04/12/2017, 11:51 AM Pager [phone]

## 2017-04-12 NOTE — Progress Notes (Signed)
Modified Barium Swallow Progress Note  Patient Details  Name: Rebecca [name] MRN: [id number] Date of Birth: Apr 14, 1928  Today's Date: 04/12/2017  Modified Barium Swallow completed.  Full report located under Chart Review in the Imaging Section.  Brief recommendations include the following:  Clinical Impression  Pt's oral control, manipulation, mastication and transit were all within functional limits. Swallow was appropriately triggered at pt's valleculae with adequate laryngeal elevation, epiglottic inversion to protect airway and pharyngeal constriction. No laryngeal intrusion with any consistency and no post swallow residue. Esophagus briefly viewed without observation of residue across consistencies including barium pill. Recommend pt continue Dys 3 for energy conservation, thin liquids and pills with liquids. No further ST needed.    Swallow Evaluation Recommendations       SLP Diet Recommendations: Dysphagia 3 (Mech soft) solids;Thin liquid   Liquid Administration via: Straw;Cup   Medication Administration: Whole meds with liquid   Supervision: Patient able to self feed   Compensations: Slow rate;Small sips/bites   Postural Changes: Seated upright at 90 degrees;Remain semi-upright after after feeds/meals (Comment)   Oral Care Recommendations: Oral care BID        [place] Siren 04/12/2017,9:47 AM   Orbie Pyo Colvin Caroli.Ed [employer] [phone]

## 2017-04-13 ENCOUNTER — Ambulatory Visit: Payer: Self-pay | Admitting: Internal Medicine

## 2017-04-13 ENCOUNTER — Ambulatory Visit
Admission: RE | Admit: 2017-04-13 | Discharge: 2017-04-13 | Disposition: A | Discharge status: Home / Self Care | Payer: Medicare Other | Source: Ambulatory Visit | Attending: Radiation Oncology | Admitting: Radiation Oncology

## 2017-04-13 ENCOUNTER — Ambulatory Visit: Payer: Self-pay

## 2017-04-13 ENCOUNTER — Telehealth: Payer: Self-pay | Admitting: *Deleted

## 2017-04-13 ENCOUNTER — Other Ambulatory Visit: Payer: Self-pay

## 2017-04-13 DIAGNOSIS — I959 Hypotension, unspecified: Secondary | ICD-10-CM | POA: Diagnosis not present

## 2017-04-13 DIAGNOSIS — A419 Sepsis, unspecified organism: Secondary | ICD-10-CM | POA: Diagnosis not present

## 2017-04-13 DIAGNOSIS — R21 Rash and other nonspecific skin eruption: Secondary | ICD-10-CM | POA: Diagnosis not present

## 2017-04-13 DIAGNOSIS — C3411 Malignant neoplasm of upper lobe, right bronchus or lung: Secondary | ICD-10-CM | POA: Diagnosis not present

## 2017-04-13 DIAGNOSIS — Z87891 Personal history of nicotine dependence: Secondary | ICD-10-CM | POA: Diagnosis not present

## 2017-04-13 DIAGNOSIS — E039 Hypothyroidism, unspecified: Secondary | ICD-10-CM | POA: Diagnosis not present

## 2017-04-13 DIAGNOSIS — J9611 Chronic respiratory failure with hypoxia: Secondary | ICD-10-CM | POA: Diagnosis not present

## 2017-04-13 DIAGNOSIS — R41841 Cognitive communication deficit: Secondary | ICD-10-CM | POA: Diagnosis not present

## 2017-04-13 DIAGNOSIS — J80 Acute respiratory distress syndrome: Secondary | ICD-10-CM | POA: Diagnosis not present

## 2017-04-13 DIAGNOSIS — C3492 Malignant neoplasm of unspecified part of left bronchus or lung: Secondary | ICD-10-CM | POA: Diagnosis not present

## 2017-04-13 DIAGNOSIS — R262 Difficulty in walking, not elsewhere classified: Secondary | ICD-10-CM | POA: Diagnosis not present

## 2017-04-13 DIAGNOSIS — Z13228 Encounter for screening for other metabolic disorders: Secondary | ICD-10-CM | POA: Diagnosis not present

## 2017-04-13 DIAGNOSIS — D61818 Other pancytopenia: Secondary | ICD-10-CM | POA: Diagnosis not present

## 2017-04-13 DIAGNOSIS — R29898 Other symptoms and signs involving the musculoskeletal system: Secondary | ICD-10-CM | POA: Diagnosis not present

## 2017-04-13 DIAGNOSIS — F0391 Unspecified dementia with behavioral disturbance: Secondary | ICD-10-CM | POA: Diagnosis not present

## 2017-04-13 DIAGNOSIS — R0789 Other chest pain: Secondary | ICD-10-CM | POA: Diagnosis not present

## 2017-04-13 DIAGNOSIS — K219 Gastro-esophageal reflux disease without esophagitis: Secondary | ICD-10-CM | POA: Diagnosis not present

## 2017-04-13 DIAGNOSIS — G9341 Metabolic encephalopathy: Secondary | ICD-10-CM | POA: Diagnosis not present

## 2017-04-13 DIAGNOSIS — R05 Cough: Secondary | ICD-10-CM | POA: Diagnosis not present

## 2017-04-13 DIAGNOSIS — R531 Weakness: Secondary | ICD-10-CM | POA: Diagnosis not present

## 2017-04-13 DIAGNOSIS — J9612 Chronic respiratory failure with hypercapnia: Secondary | ICD-10-CM | POA: Diagnosis not present

## 2017-04-13 DIAGNOSIS — Z79899 Other long term (current) drug therapy: Secondary | ICD-10-CM | POA: Diagnosis not present

## 2017-04-13 DIAGNOSIS — Z881 Allergy status to other antibiotic agents status: Secondary | ICD-10-CM | POA: Diagnosis not present

## 2017-04-13 DIAGNOSIS — R410 Disorientation, unspecified: Secondary | ICD-10-CM | POA: Diagnosis not present

## 2017-04-13 DIAGNOSIS — W19XXXA Unspecified fall, initial encounter: Secondary | ICD-10-CM | POA: Diagnosis not present

## 2017-04-13 DIAGNOSIS — C3412 Malignant neoplasm of upper lobe, left bronchus or lung: Secondary | ICD-10-CM | POA: Diagnosis not present

## 2017-04-13 DIAGNOSIS — R7989 Other specified abnormal findings of blood chemistry: Secondary | ICD-10-CM | POA: Diagnosis not present

## 2017-04-13 DIAGNOSIS — M6281 Muscle weakness (generalized): Secondary | ICD-10-CM | POA: Diagnosis not present

## 2017-04-13 DIAGNOSIS — R278 Other lack of coordination: Secondary | ICD-10-CM | POA: Diagnosis not present

## 2017-04-13 DIAGNOSIS — R4182 Altered mental status, unspecified: Secondary | ICD-10-CM | POA: Diagnosis not present

## 2017-04-13 DIAGNOSIS — J189 Pneumonia, unspecified organism: Secondary | ICD-10-CM | POA: Diagnosis not present

## 2017-04-13 DIAGNOSIS — I2699 Other pulmonary embolism without acute cor pulmonale: Secondary | ICD-10-CM | POA: Diagnosis not present

## 2017-04-13 DIAGNOSIS — Z510 Encounter for antineoplastic radiation therapy: Secondary | ICD-10-CM | POA: Diagnosis not present

## 2017-04-13 DIAGNOSIS — D649 Anemia, unspecified: Secondary | ICD-10-CM | POA: Diagnosis not present

## 2017-04-13 DIAGNOSIS — G40909 Epilepsy, unspecified, not intractable, without status epilepticus: Secondary | ICD-10-CM | POA: Diagnosis not present

## 2017-04-13 DIAGNOSIS — R5381 Other malaise: Secondary | ICD-10-CM | POA: Diagnosis not present

## 2017-04-13 DIAGNOSIS — R0602 Shortness of breath: Secondary | ICD-10-CM | POA: Diagnosis not present

## 2017-04-13 DIAGNOSIS — Z9889 Other specified postprocedural states: Secondary | ICD-10-CM | POA: Diagnosis not present

## 2017-04-13 DIAGNOSIS — R2681 Unsteadiness on feet: Secondary | ICD-10-CM | POA: Diagnosis not present

## 2017-04-13 LAB — CULTURE, BLOOD (ROUTINE X 2)
CULTURE: NO GROWTH
Culture: NO GROWTH
SPECIAL REQUESTS: ADEQUATE
Special Requests: ADEQUATE

## 2017-04-13 MED ORDER — ALBUTEROL SULFATE (2.5 MG/3ML) 0.083% IN NEBU: 2.5000 mg | INHALATION_SOLUTION | RESPIRATORY_TRACT | Status: DC | PRN

## 2017-04-13 NOTE — Care Management Important Message (Signed)
Important Message  Patient Details  Name: Rebecca [name] MRN: [id number] Date of Birth: 1927-07-13   Medicare Important Message Given:  Yes    Kerin Salen 04/13/2017, 11:09 AMImportant Message  Patient Details  Name: Rebecca [name] MRN: [id number] Date of Birth: [birth year]   Medicare Important Message Given:  Yes    Kerin Salen 04/13/2017, 11:09 AM

## 2017-04-13 NOTE — Progress Notes (Signed)
[facility] advised CSW of bed availability for pt today.  Daughter to complete admission paperwork at facility and once complete CSW will arrange ambulance transport. Provided DC information to facility via the [place].  Sharren Bridge, MSW, LCSW Clinical Social Work 04/13/2017 [phone]

## 2017-04-13 NOTE — Progress Notes (Signed)
Physical Therapy Treatment Patient Details Name: Rebecca [name] BEGIN MRN: [id number] DOB: 1927-08-29 Today's Date: 04/13/2017    History of Present Illness Pt admitted through ED from [facility] with fever, c/p and hypotension.  Pt with hx of SZ, Bil TKR, emphysema, Lung CA (currently on chemo), dementia and Chronic resp failure 2* pulmonary fibrosis    PT Comments    The patient is pleasantly confused. Ambulated on 2 liters [place], Saturation  Dropped to 85% with activity. Resting 96% on 2 L. Plans  SNF.   Follow Up Recommendations  SNF     Equipment Recommendations  None recommended by PT    Recommendations for Other Services       Precautions / Restrictions Precautions Precautions: Fall Precaution Comments: on oxygen Restrictions Weight Bearing Restrictions: No    Mobility  Bed Mobility               General bed mobility comments: in recliner  Transfers   Equipment used: Rolling walker (2 wheeled) Transfers: Sit to/from Stand Sit to Stand: Min assist         General transfer comment: cues for safety awareness and use of UEs to self assist, multimodal cues for staying on task, easily self distracting.  Ambulation/Gait   Ambulation Distance (Feet): 20 Feet(x 2) Assistive device: Rolling walker (2 wheeled) Gait Pattern/deviations: Step-through pattern;Decreased step length - right;Decreased step length - left;Shuffle     General Gait Details: cues for posture and position from RW. Gait/balance improved today. Easily distracted.   Stairs            Wheelchair Mobility    Modified Rankin (Stroke Patients Only)       Balance Overall balance assessment: Needs assistance Sitting-balance support: No upper extremity supported;Feet supported Sitting balance-Leahy Scale: Fair     Standing balance support: Bilateral upper extremity supported Standing balance-Leahy Scale: Poor                              Cognition  Arousal/Alertness: Awake/alert Behavior During Therapy: Impulsive Overall Cognitive Status: History of cognitive impairments - at baseline                                        Exercises      General Comments        Pertinent Vitals/Pain Pain Assessment: No/denies pain    Home Living                      Prior Function            PT Goals (current goals can now be found in the care plan section) Progress towards PT goals: Progressing toward goals    Frequency    Min 2X/week      PT Plan Current plan remains appropriate;Frequency needs to be updated    Co-evaluation              AM-PAC PT "6 Clicks" Daily Activity  Outcome Measure  Difficulty turning over in bed (including adjusting bedclothes, sheets and blankets)?: Unable Difficulty moving from lying on back to sitting on the side of the bed? : Unable Difficulty sitting down on and standing up from a chair with arms (e.g., wheelchair, bedside commode, etc,.)?: Unable Help needed moving to and from a bed to chair (including a wheelchair)?: A  Little Help needed walking in hospital room?: A Little Help needed climbing 3-5 steps with a railing? : A Lot 6 Click Score: 11    End of Session Equipment Utilized During Treatment: Gait belt;Oxygen Activity Tolerance: Patient tolerated treatment well Patient left: in chair;with call bell/phone within reach;with chair alarm set Nurse Communication: Mobility status       Time: 2130-8657 PT Time Calculation (min) (ACUTE ONLY): 19 min  Charges:  $Gait Training: 8-22 mins                    G CodesTresa [name] PT [phone]    Rebecca [name] 04/13/2017, 11:22 AM

## 2017-04-13 NOTE — Progress Notes (Signed)
Pt seen and examined at the bedside. Pt will go for RT today and will be discharge afterwards. Please refer to discharge summary completed 04/12/2017. No changes in medical management since 04/12/2017.  Leisa Lenz [facility] [phone]

## 2017-04-13 NOTE — Progress Notes (Signed)
Patient is discharged to [facility]; RN called to give a report to [employer] at 1230 ; discharge package was done by SW; notified family member.

## 2017-04-13 NOTE — Telephone Encounter (Signed)
"  Rebecca [name] calling about an appointment scheduled for this patient tomorrow.  We just received her from Hospital.  Transporter reports treatment appointment.  Our orders are to schedule F/U with Dr. Julien Nordmann.  Does she need to see Dr. Julien Nordmann first instead of coming in for treatment?  Does the family come with her?"      Tomorrow's appointment is with provider before scheduled treatment.  Unable to tell if family has come for prevouius appointments. tI'll let the family know about the appointment."

## 2017-04-13 NOTE — Progress Notes (Signed)
Pt discharged and will admit to [facility] for rehab. Report [phone]. Family has completed admission paperwork at facility and CSW provided pt's information via the [facility]. CSW completed medical necessity form and will arrange ambulance transport once pt completes radiation.  Sharren Bridge, MSW, LCSW Clinical Social Work 04/13/2017 [phone]

## 2017-04-14 ENCOUNTER — Non-Acute Institutional Stay (SKILLED_NURSING_FACILITY): Payer: Medicare Other | Admitting: Family

## 2017-04-14 ENCOUNTER — Ambulatory Visit
Admission: RE | Admit: 2017-04-14 | Discharge: 2017-04-14 | Disposition: A | Discharge status: Home / Self Care | Payer: Medicare Other | Source: Ambulatory Visit | Attending: Radiation Oncology | Admitting: Radiation Oncology

## 2017-04-14 ENCOUNTER — Other Ambulatory Visit: Payer: Self-pay | Admitting: Radiation Oncology

## 2017-04-14 ENCOUNTER — Encounter: Payer: Self-pay | Admitting: Family

## 2017-04-14 DIAGNOSIS — Z881 Allergy status to other antibiotic agents status: Secondary | ICD-10-CM | POA: Diagnosis not present

## 2017-04-14 DIAGNOSIS — R05 Cough: Secondary | ICD-10-CM | POA: Diagnosis not present

## 2017-04-14 DIAGNOSIS — R058 Other specified cough: Secondary | ICD-10-CM

## 2017-04-14 DIAGNOSIS — Z9889 Other specified postprocedural states: Secondary | ICD-10-CM | POA: Diagnosis not present

## 2017-04-14 DIAGNOSIS — Z87891 Personal history of nicotine dependence: Secondary | ICD-10-CM | POA: Diagnosis not present

## 2017-04-14 DIAGNOSIS — R21 Rash and other nonspecific skin eruption: Secondary | ICD-10-CM

## 2017-04-14 DIAGNOSIS — Z510 Encounter for antineoplastic radiation therapy: Secondary | ICD-10-CM | POA: Diagnosis not present

## 2017-04-14 DIAGNOSIS — C3412 Malignant neoplasm of upper lobe, left bronchus or lung: Secondary | ICD-10-CM | POA: Diagnosis not present

## 2017-04-14 DIAGNOSIS — C3492 Malignant neoplasm of unspecified part of left bronchus or lung: Secondary | ICD-10-CM | POA: Diagnosis not present

## 2017-04-14 DIAGNOSIS — Z79899 Other long term (current) drug therapy: Secondary | ICD-10-CM | POA: Diagnosis not present

## 2017-04-14 MED ORDER — GUAIFENESIN ER 600 MG PO TB12: 600.0000 mg | ORAL_TABLET | Freq: Two times a day (BID) | ORAL | 0 refills | Status: DC

## 2017-04-14 NOTE — Progress Notes (Signed)
Location:  [facility] Room Number: 25 Place of Service:  SNF (31) Provider: Laney Bagshaw FNP-C  [name], MD  Patient Care Team: [name], MD as PCP - General (Internal Medicine) [name], [facility] [name], MD as Consulting Physician (Orthopedic Surgery) [name], MD as Consulting Physician (Physical Medicine and Rehabilitation) Melina Schools, MD as Consulting Physician (Orthopedic Surgery) [name], MD as Consulting Physician (Otolaryngology) Jadae Steinke, [name], NP as Nurse Practitioner (Family Medicine)  Extended Emergency Contact Information Primary Emergency Contact: [name] Address: [address]          [name] of [place] Phone: [phone] Relation: Son Secondary Emergency Contact: [name] [place] of [place] Phone: [phone] Mobile Phone: [phone] Relation: Daughter  Code Status:  DNR Goals of care: Advanced Directive information Advanced Directives 04/14/2017  Does Patient Have a Medical Advance Directive? Yes  Type of Paramedic of [place];Out of facility DNR (pink MOST or yellow form);Living will  Does patient want to make changes to medical advance directive? -  Copy of [place] in Chart? Yes  Pre-existing out of facility DNR order (yellow form or pink MOST form) Yellow form placed in chart (order not valid for inpatient use)     Chief Complaint  Patient presents with  . Acute Visit    cough    HPI:  Pt is a 83 y.o. female seen today at [facility] for an acute visit for evaluation of non-productive cough. She is seen in her room today per facility Nurse request. Nurse reports patient has a worsening cough. Of note she is status post hospital admission from 04/08/2017-04/12/2017 for fever and hypotension.she was treated with broad spectrum antibiotics and IVF.Her CXR,U/A,blood cultures and labs showed  no evidence of infections.Antibiotics held since 04/09/2017. She has a significant medical history of pulmonary fibrosis on 3L oxygen, emphysema, squamous cell CA w/lingual mass, and stage IIa NSCLC on chemotherapy and Radiation.she denies any fever or chills she went for her radiation therapy this morning prior to visit.      Past Medical History:  Diagnosis Date  . Anemia, unspecified 03/18/2011  . Anxiety state, unspecified 01/2011  . Blood in stool 06/15/2012  . Corns and callosities 07/01/2011  . Dementia   . Diverticulosis 02/2011  . Dizziness   . DJD (degenerative joint disease)   . Dysuria 06/17/2011  . Hemorrhoids 02/2011  . Low back pain   . Lung cancer ([place])    left  . Mitral valve problem thickened   thickened  . Osteoarthritis of both hands 10/09/2015  . Osteoporosis 02/2011  . Other abnormal blood chemistry 0/30/2012  . Other acquired deformity of toe 12/16/2011  . Pain in joint, site unspecified 01/2012  . 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 Sacroiliitis, not elsewhere classified ([place]) 05/2011  . Seizures ([place])    Remotely  . Unspecified hypothyroidism 01/2011  . 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 Vitamin D deficiency 01/2011   Past Surgical History:  Procedure Laterality Date  . bletheroplasty    . CATARACT EXTRACTION W/ INTRAOCULAR LENS  IMPLANT, BILATERAL  2010  . CERVICAL LAMINECTOMY  2005  . CYSTOSCOPY WITH RETROGRADE PYELOGRAM, URETEROSCOPY AND STENT PLACEMENT Bilateral 11/16/2016   Procedure: CYSTOSCOPY WITH RETROGRADE PYELOGRAMS/CLOT EVACUATION;  Surgeon: Irine Seal, MD;  Location: [facility];  Service: Urology;  Laterality: Bilateral;  . [place]  2013  right 2nd toe Dr. Mallie Mussel  . IVC FILTER PLACEMENT ([facility] HX)    . JOINT REPLACEMENT Bilateral 2002 Right, 1992 Left   knees  . TRANSURETHRAL RESECTION OF BLADDER TUMOR N/A 11/16/2016   Procedure: TRANSURETHRAL RESECTION OF BLADDER TUMOR  (TURBT);  Surgeon: Irine Seal, MD;  Location: [facility];  Service: Urology;  Laterality: N/A;    Allergies  Allergen Reactions  . Macrodantin [Nitrofurantoin]   . Morphine And Related Other (See Comments)    Altered mental state   . Sulfa Antibiotics Other (See Comments)    Reaction: unknown    Outpatient Encounter Medications as of 04/14/2017  Medication Sig  . acetaminophen (TYLENOL) 325 MG tablet Take 650 mg by mouth 2 (two) times daily.  Marland Kitchen acetaminophen (TYLENOL) 325 MG tablet Take 650 mg by mouth 2 (two) times daily as needed for mild pain or fever.  . Calcium Carbonate-Vitamin D3 (CALCIUM 600-D) 600-400 MG-UNIT TABS Take 1 tablet by mouth daily.  . Cholecalciferol (VITAMIN D3) 5000 units TABS Take 1 tablet by mouth daily.   . clonazePAM (KLONOPIN) 0.5 MG tablet Take 1 tablet (0.5 mg total) by mouth daily. and as needed at bedtime for agitation or anxiety  . donepezil (ARICEPT) 10 MG tablet Take 10 mg by mouth at bedtime.   . DULoxetine (CYMBALTA) 30 MG capsule Take 30 mg by mouth daily.   Marland Kitchen lactose free nutrition (BOOST) LIQD Take 237 mLs by mouth 2 (two) times daily between meals.   Marland Kitchen levothyroxine (SYNTHROID, LEVOTHROID) 100 MCG tablet Take 1 tablet (100 mcg total) by mouth daily before breakfast. For thyroid  . meloxicam (MOBIC) 7.5 MG tablet Take 7.5 mg by mouth daily as needed for pain.  . memantine (NAMENDA) 10 MG tablet Take 10 mg by mouth 2 (two) times daily.  . Multiple Vitamins-Minerals (THERA M PLUS PO) Take 1 tablet by mouth daily.   Marland Kitchen omega-3 acid ethyl esters (LOVAZA) 1 g capsule Take 1 g by mouth daily.  Marland Kitchen omeprazole (PRILOSEC) 20 MG capsule Take 20 mg by mouth at bedtime.  . OXYGEN Inhale 2 L/min into the lungs as needed. To keep O2 Sat >90%  . phenytoin (DILANTIN) 100 MG ER capsule Take 200 mg by mouth at bedtime.   . phenytoin (DILANTIN) 100 MG ER capsule Take 100 mg by mouth daily.  . prochlorperazine (COMPAZINE) 10 MG tablet Take 1 tablet (10 mg total) by mouth  every 6 (six) hours as needed for nausea or vomiting.  Marland Kitchen QUEtiapine (SEROQUEL) 25 MG tablet Take 12.5 mg by mouth at bedtime. At 8 pm  . umeclidinium-vilanterol (ANORO ELLIPTA) 62.5-25 MCG/INH AEPB Inhale 1 puff into the lungs daily.  . Wound Dressings (SONAFINE) Apply 1 application topically 3 (three) times daily. APPLY TO RADIATION DERMATITIS 3 TIMES DAILY, GENTLY MASSAGE AREA UNTIL ABSORBED, CONTINUE APPLICATION UNTIL SKIN HAS FULLY RECOVERED  . [DISCONTINUED] clonazePAM (KLONOPIN) 0.5 MG tablet Take 0.5 mg by mouth at bedtime as needed for anxiety (agitation).   No facility-administered encounter medications on file as of 04/14/2017.     Review of Systems  Constitutional: Negative for activity change, appetite change, chills and fever.  HENT: Negative for congestion, rhinorrhea, sinus pressure, sinus pain, sneezing and sore throat.   Respiratory: Positive for cough. Negative for chest tightness and wheezing.        Shortness of breath with exertion  Cardiovascular: Negative for chest pain, palpitations and leg swelling.  Gastrointestinal: Negative for abdominal distention, abdominal pain, constipation, diarrhea, nausea and vomiting.  Musculoskeletal: Positive for gait problem.  Skin: Negative for color change and pallor.       Itchy on the back  Psychiatric/Behavioral: Positive for confusion. Negative for agitation and sleep disturbance. The patient is not nervous/anxious.     Immunization History  Administered Date(s) Administered  . Influenza Split 02/18/2017  . Influenza Whole 03/17/2007, 02/16/2009, 01/30/2010, 02/16/2013  . Influenza-Unspecified 03/02/2014, 02/22/2015, 02/28/2016  . PPD Test 07/09/2010, 02/04/2015, 04/04/2015, 06/04/2015  . Pneumococcal Conjugate-13 01/13/2017  . Pneumococcal Polysaccharide-23 05/19/2005, 05/23/2013  . Td 05/19/2005  . Tetanus 10/06/2012  . Zoster 05/19/2012   Pertinent  Health Maintenance Due  Topic Date Due  . DEXA SCAN  05/19/2017  (Originally 11/19/1994)  . INFLUENZA VACCINE  Completed  . PNA vac Low Risk Adult  Completed   Fall Risk  03/13/2017 02/18/2017 01/05/2017 10/09/2015 07/10/2015  Falls in the past year? No Yes No No Yes  Number falls in past yr: - 2 or more - - 1  Injury with Fall? - No - - Yes  Comment - - - - scape on her left leg  Risk Factor Category  - High Fall Risk - - -    Vitals:   04/14/17 0932  BP: 108/68  Pulse: 82  Resp: 20  Temp: 97.8 F (36.6 C)  SpO2: 93%  Weight: 146 lb (66.2 kg)  Height: 5\' 7"  (1.702 m)   Body mass index is 22.87 kg/m. Physical Exam  Constitutional: She appears well-developed.  Frail elderly in no acute distress  HENT:  Head: Normocephalic.  Mouth/Throat: Oropharynx is clear and moist. No oropharyngeal exudate.  Eyes: Conjunctivae and EOM are normal. Pupils are equal, round, and reactive to light. Right eye exhibits no discharge. Left eye exhibits no discharge. No scleral icterus.  Neck: Normal range of motion. No JVD present. No thyromegaly present.  Cardiovascular: Normal rate, regular rhythm, normal heart sounds and intact distal pulses. Exam reveals no gallop and no friction rub.  No murmur heard. Pulmonary/Chest: Effort normal. No respiratory distress. She has no rales.  Right upper lobe expiration wheezes and diminished lung bases  Abdominal: Soft. Bowel sounds are normal. She exhibits no distension. There is no tenderness. There is no rebound and no guarding.  Musculoskeletal: She exhibits no edema or tenderness.  Moves x 4 extremities. Unsteady gait uses FWW  Lymphadenopathy:    She has no cervical adenopathy.  Neurological: Coordination normal.  Alert and oriented to person and place but not time  Skin: Skin is warm and dry. No erythema.  Mid upper back resolving rash without any signs of infection.   Psychiatric: She has a normal mood and affect.    Labs reviewed: Recent Labs    11/17/16 0454  04/06/17 1022 04/08/17 1506 04/08/17 2336  04/08/17 2337  NA 140   < > 142 139 138  --   K 3.6   < > 4.4 5.0 4.4  --   CL 103  --   --  104 110  --   CO2 31   < > 28 28 22   --   GLUCOSE 95   < > 91 157* 138*  --   BUN 10   < > 29.8* 33* 26*  --   CREATININE 0.53   < > 0.9 0.78 0.55 0.55  CALCIUM 8.5*   < > 9.6 8.9 7.7*  --    < > = values in this interval not displayed.   Recent Labs    04/06/17 1022 04/08/17  1506 04/08/17 2336  AST 23 25 18   ALT 24 21 16   ALKPHOS 72 64 51  BILITOT 0.25 0.5 0.7  PROT 7.1 6.9 5.8*  ALBUMIN 3.2* 3.4* 2.7*   Recent Labs    03/30/17 0926 04/06/17 1022 04/08/17 1506 04/08/17 2337 04/10/17 0302 04/11/17 0636  WBC 4.0 5.5 9.3 6.4 6.4 3.8*  NEUTROABS 3.1 4.6 8.3*  --   --   --   HGB 12.4 11.6 10.9* 9.9* 9.2* 9.7*  HCT 38.0 35.1 32.9* 30.0* 28.0* 29.3*  MCV 99.2 99.7 100.3* 100.7* 101.4* 102.1*  PLT 170 110* 82* 71* 70* 79*   Lab Results  Component Value Date   TSH 0.533 11/13/2016   Lab Results  Component Value Date   HGBA1C 5.4 03/08/2015   Lab Results  Component Value Date   CHOL 235 (H) 03/06/2009   HDL 108.40 03/06/2009   LDLDIRECT 108.2 03/06/2009    Significant Diagnostic Results in last 30 days:  Dg Chest 2 View  Result Date: 04/12/2017 CLINICAL DATA:  Productive cough. EXAM: CHEST  2 VIEW COMPARISON:  Radiographs of April 08, 2017. FINDINGS: Stable cardiomegaly. Stable interstitial densities are noted throughout both lungs most consistent with pulmonary fibrosis. Mildly increased bibasilar densities are noted concerning for subsegmental atelectasis or edema with mild associated pleural effusions. Bony thorax is unremarkable. IMPRESSION: Stable findings concerning for pulmonary fibrosis. Mildly increased bibasilar densities are noted concerning for subsegmental atelectasis or edema with mild associated pleural effusions. Electronically Signed   By: Marijo Conception, M.D.   On: 04/12/2017 09:37   Dg Chest 2 View  Result Date: 04/08/2017 CLINICAL DATA:  Chest pain  and lung cancer EXAM: CHEST  2 VIEW COMPARISON:  01/29/2017 FINDINGS: Low volume chest with coarse interstitial opacities. When accounting for lower volumes and interstitial crowding no acute superimposed opacity. No effusion or pneumothorax. Chronic cardiomegaly. Advanced disc degeneration with scoliosis. IMPRESSION: 1. Pulmonary fibrosis. Accounting for low lung volumes and interstitial crowding, no acute opacity identified. 2. A lingular nodule that was biopsied 01/29/2017 is not discretely visible. Electronically Signed   By: Monte Fantasia M.D.   On: 04/08/2017 15:52   Dg Swallowing Func-speech Pathology  Result Date: 04/12/2017 Objective Swallowing Evaluation: Type of Study: MBS-Modified Barium Swallow Study  Patient Details Name: AUDRY KAUZLARICH MRN: [id number] Date of Birth: May 22, 1927 Today's Date: 04/12/2017 Time: SLP Start Time (ACUTE ONLY): 6295 -SLP Stop Time (ACUTE ONLY): 0935 SLP Time Calculation (min) (ACUTE ONLY): 15 min Past Medical History: Past Medical History: Diagnosis Date . Anemia, unspecified 03/18/2011 . Anxiety state, unspecified 01/2011 . Blood in stool 06/15/2012 . Corns and callosities 07/01/2011 . Dementia  . Diverticulosis 02/2011 . Dizziness  . DJD (degenerative joint disease)  . Dysuria 06/17/2011 . Hemorrhoids 02/2011 . Low back pain  . Lung cancer ([place])   left . Mitral valve problem thickened  thickened . Osteoarthritis of both hands 10/09/2015 . Osteoporosis 02/2011 . Other abnormal blood chemistry 0/30/2012 . Other acquired deformity of toe 12/16/2011 . Pain in joint, site unspecified 01/2012 . Personality change due to conditions classified elsewhere 01/2011 . Sacroiliitis, not elsewhere classified ([place]) 05/2011 . Seizures ([place])   Remotely . Unspecified hypothyroidism 01/2011 . 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 Vitamin D deficiency 01/2011 Past Surgical History: Past Surgical History: Procedure Laterality Date . bletheroplasty   . CATARACT EXTRACTION W/  INTRAOCULAR LENS  IMPLANT, BILATERAL  2010 . CERVICAL LAMINECTOMY  2005 . CYSTOSCOPY WITH RETROGRADE PYELOGRAM, URETEROSCOPY AND STENT  PLACEMENT Bilateral 11/16/2016  Procedure: CYSTOSCOPY WITH RETROGRADE PYELOGRAMS/CLOT EVACUATION;  Surgeon: Irine Seal, MD;  Location: [facility];  Service: Urology;  Laterality: Bilateral; . HAMMER TOE SURGERY  2013  right 2nd toe Dr. Mallie Mussel . IVC FILTER PLACEMENT ([facility] HX)   . JOINT REPLACEMENT Bilateral 2002 Right, 1992 Left  knees . TRANSURETHRAL RESECTION OF BLADDER TUMOR N/A 11/16/2016  Procedure: TRANSURETHRAL RESECTION OF BLADDER TUMOR (TURBT);  Surgeon: Irine Seal, MD;  Location: [facility];  Service: Urology;  Laterality: N/A; HPI: 83 years old white female with unresectable stage II a non-small cell lung cancer, squamous cell carcinoma and was undergoing a course of concurrent chemoradiation with weekly carboplatin and paclitaxel status post 5 cycles. Admitted with complaints of chest pain. CXR 11/21 Pulmonary fibrosis. Accounting for low lung volumes and interstitial crowding, no acute opacity identified. A lingular nodule that was biopsied 01/29/2017 is not discretely visible.  No Data Recorded Assessment / Plan / Recommendation CHL IP CLINICAL IMPRESSIONS 04/12/2017 Clinical Impression Pt's oral control, manipulation, mastication and transit were all within functional limits. Swallow was appropriately triggered at pt's valleculae with adequate laryngeal elevation, epiglottic inversion to protect airway and pharyngeal constriction. No laryngeal intrusion with any consistency and no post swallow residue. Esophagus briefly viewed without observation of residue across consistencies including barium pill. Recommend pt continue Dys 3 for energy conservation, thin liquids and pills with liquids. No further ST needed.  SLP Visit Diagnosis Dysphagia, unspecified (R13.10) Attention and concentration deficit following -- Frontal lobe and executive function deficit following -- Impact on  safety and function Mild aspiration risk   CHL IP TREATMENT RECOMMENDATION 04/12/2017 Treatment Recommendations No treatment recommended at this time   Prognosis 04/12/2017 Prognosis for Safe Diet Advancement -- Barriers to Reach Goals Cognitive deficits Barriers/Prognosis Comment -- CHL IP DIET RECOMMENDATION 04/12/2017 SLP Diet Recommendations Dysphagia 3 (Mech soft) solids;Thin liquid Liquid Administration via Straw;Cup Medication Administration Whole meds with liquid Compensations Slow rate;Small sips/bites Postural Changes Seated upright at 90 degrees;Remain semi-upright after after feeds/meals (Comment)   CHL IP OTHER RECOMMENDATIONS 04/12/2017 Recommended Consults -- Oral Care Recommendations Oral care BID Other Recommendations --   CHL IP FOLLOW UP RECOMMENDATIONS 04/12/2017 Follow up Recommendations None   No flowsheet data found.     CHL IP ORAL PHASE 04/12/2017 Oral Phase WFL Oral - Pudding Teaspoon -- Oral - Pudding Cup -- Oral - Honey Teaspoon -- Oral - Honey Cup -- Oral - Nectar Teaspoon -- Oral - Nectar Cup -- Oral - Nectar Straw -- Oral - Thin Teaspoon -- Oral - Thin Cup -- Oral - Thin Straw -- Oral - Puree -- Oral - Mech Soft -- Oral - Regular -- Oral - Multi-Consistency -- Oral - Pill -- Oral Phase - Comment --  CHL IP PHARYNGEAL PHASE 04/12/2017 Pharyngeal Phase WFL Pharyngeal- Pudding Teaspoon -- Pharyngeal -- Pharyngeal- Pudding Cup -- Pharyngeal -- Pharyngeal- Honey Teaspoon -- Pharyngeal -- Pharyngeal- Honey Cup -- Pharyngeal -- Pharyngeal- Nectar Teaspoon -- Pharyngeal -- Pharyngeal- Nectar Cup -- Pharyngeal -- Pharyngeal- Nectar Straw -- Pharyngeal -- Pharyngeal- Thin Teaspoon -- Pharyngeal -- Pharyngeal- Thin Cup -- Pharyngeal -- Pharyngeal- Thin Straw -- Pharyngeal -- Pharyngeal- Puree -- Pharyngeal -- Pharyngeal- Mechanical Soft -- Pharyngeal -- Pharyngeal- Regular -- Pharyngeal -- Pharyngeal- Multi-consistency -- Pharyngeal -- Pharyngeal- Pill -- Pharyngeal -- Pharyngeal Comment --  CHL  IP CERVICAL ESOPHAGEAL PHASE 04/12/2017 Cervical Esophageal Phase WFL Pudding Teaspoon -- Pudding Cup -- Honey Teaspoon -- Honey Cup -- Nectar Teaspoon -- Nectar Cup -- [employer]  Straw -- Thin Teaspoon -- Thin Cup -- Thin Straw -- Puree -- Mechanical Soft -- Regular -- Multi-consistency -- Pill -- Cervical Esophageal Comment -- No flowsheet data found. [place] Siren 04/12/2017, 9:47 AM Orbie Pyo Colvin Caroli.Ed CCC-SLP Pager [phone]              Assessment/Plan 1. Non-productive cough Afebrile.chronic shortness of breath with exertion. Right upper lobe expiration wheezes and diminished lung bases.CXR during recent hospital admission was negative for infiltrates.suspect her Hx of pulmonary fibrosis, Emphysema and squamous cell CA w/lingual mass could be contributing to her cough. Will obtain portable CXR Pa/Lat rule out Pneumonia.Continue on Mucinex,continuous oxygen via [place] and Anoro Ellipta.Will initiate Duonebs every 6 hours if symptoms worsen.   2. Rash  Mid upper back rash seems to be resolving unable to discern.Continue with current body lotion to keep skin moist to prevent itching.continue to monitor.    Family/ staff Communication: Reviewed plan of care with patient and facility Nurse   Labs/tests ordered: portable CXR Pa/Lat rule out Pneumonia.  Sandrea Hughs, NP

## 2017-04-15 ENCOUNTER — Non-Acute Institutional Stay (SKILLED_NURSING_FACILITY): Payer: Medicare Other | Admitting: Internal Medicine

## 2017-04-15 ENCOUNTER — Telehealth: Payer: Self-pay

## 2017-04-15 ENCOUNTER — Encounter: Payer: Medicare Other | Admitting: Internal Medicine

## 2017-04-15 ENCOUNTER — Telehealth: Payer: Self-pay | Admitting: Medical Oncology

## 2017-04-15 ENCOUNTER — Encounter: Payer: Self-pay | Admitting: Internal Medicine

## 2017-04-15 ENCOUNTER — Ambulatory Visit
Admission: RE | Admit: 2017-04-15 | Discharge: 2017-04-15 | Disposition: A | Discharge status: Home / Self Care | Payer: Medicare Other | Source: Ambulatory Visit | Attending: Radiation Oncology | Admitting: Radiation Oncology

## 2017-04-15 ENCOUNTER — Telehealth: Payer: Self-pay | Admitting: Oncology

## 2017-04-15 DIAGNOSIS — C3412 Malignant neoplasm of upper lobe, left bronchus or lung: Secondary | ICD-10-CM | POA: Diagnosis not present

## 2017-04-15 DIAGNOSIS — J189 Pneumonia, unspecified organism: Secondary | ICD-10-CM | POA: Diagnosis not present

## 2017-04-15 DIAGNOSIS — D61818 Other pancytopenia: Secondary | ICD-10-CM | POA: Diagnosis not present

## 2017-04-15 DIAGNOSIS — Z881 Allergy status to other antibiotic agents status: Secondary | ICD-10-CM | POA: Diagnosis not present

## 2017-04-15 DIAGNOSIS — Z9889 Other specified postprocedural states: Secondary | ICD-10-CM | POA: Diagnosis not present

## 2017-04-15 DIAGNOSIS — F0391 Unspecified dementia with behavioral disturbance: Secondary | ICD-10-CM

## 2017-04-15 DIAGNOSIS — E039 Hypothyroidism, unspecified: Secondary | ICD-10-CM

## 2017-04-15 DIAGNOSIS — R531 Weakness: Secondary | ICD-10-CM | POA: Diagnosis not present

## 2017-04-15 DIAGNOSIS — Z87891 Personal history of nicotine dependence: Secondary | ICD-10-CM | POA: Diagnosis not present

## 2017-04-15 DIAGNOSIS — J9611 Chronic respiratory failure with hypoxia: Secondary | ICD-10-CM | POA: Diagnosis not present

## 2017-04-15 DIAGNOSIS — K219 Gastro-esophageal reflux disease without esophagitis: Secondary | ICD-10-CM | POA: Diagnosis not present

## 2017-04-15 DIAGNOSIS — C3492 Malignant neoplasm of unspecified part of left bronchus or lung: Secondary | ICD-10-CM | POA: Diagnosis not present

## 2017-04-15 DIAGNOSIS — Z79899 Other long term (current) drug therapy: Secondary | ICD-10-CM | POA: Diagnosis not present

## 2017-04-15 DIAGNOSIS — Z510 Encounter for antineoplastic radiation therapy: Secondary | ICD-10-CM | POA: Diagnosis not present

## 2017-04-15 DIAGNOSIS — G40909 Epilepsy, unspecified, not intractable, without status epilepticus: Secondary | ICD-10-CM

## 2017-04-15 NOTE — Progress Notes (Signed)
Provider:  [name] MD  Location:  [facility] Room Number: 25 Place of Service:  SNF (31)  PCP: [name], MD Patient Care Team: [name], MD as PCP - General (Internal Medicine) [name], [facility] [name], MD as Consulting Physician (Orthopedic Surgery) [name], MD as Consulting Physician (Physical Medicine and Rehabilitation) Melina Schools, MD as Consulting Physician (Orthopedic Surgery) [name], MD as Consulting Physician (Otolaryngology) [name], [name], NP as Nurse Practitioner (Family Medicine)  Extended Emergency Contact Information Primary Emergency Contact: [name] Address: [address], [place] Phone: [phone] Relation: Son Secondary Emergency Contact: [name] [place] Phone: [phone] Mobile Phone: [phone] Relation: Daughter  Code Status: DNR  Goals of Care: Advanced Directive information Advanced Directives 04/15/2017  Does Patient Have a Medical Advance Directive? Yes  Type of Paramedic of [place];Out of facility DNR (pink MOST or yellow form);Living will  Does patient want to make changes to medical advance directive? -  Copy of [place] in Chart? Yes  Pre-existing out of facility DNR order (yellow form or pink MOST form) Yellow form placed in chart (order not valid for inpatient use)    Chief Complaint  Patient presents with  . New Admit To SNF    HPI: Patient is a 83 y.o. female seen today for admission visit. She was in the hospital from 04/08/17-04/13/17 with fever and hypotension. She underwent workup for sepsis and was placed on iv fluids and iv antibiotics. Her cultures were negative. Chest xray was negative for infiltrate.  She was seen by SLP team with concerns for aspiration and dysphagia and is placed on dysphagia 3 diet. She had chest wall pain and  ACS was ruled out. With her deconditioning, she has been admitted to SNF for rehabilitation. She was residing in assisted living prior to this. She is seen in her room today. She is pleasantly confused. 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   Past Medical History:  Diagnosis Date  . Anemia, unspecified 03/18/2011  . Anxiety state, unspecified 01/2011  . Blood in stool 06/15/2012  . Corns and callosities 07/01/2011  . Dementia   . Diverticulosis 02/2011  . Dizziness   . DJD (degenerative joint disease)   . Dysuria 06/17/2011  . Hemorrhoids 02/2011  . Low back pain   . Lung cancer ([place])    left  . Mitral valve problem thickened   thickened  . Osteoarthritis of both hands 10/09/2015  . Osteoporosis 02/2011  . Other abnormal blood chemistry 0/30/2012  . Other acquired deformity of toe 12/16/2011  . Pain in joint, site unspecified 01/2012  . 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 Sacroiliitis, not elsewhere classified ([place]) 05/2011  . Seizures ([place])    Remotely  . Unspecified hypothyroidism 01/2011  . 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 Vitamin D deficiency 01/2011   Past Surgical History:  Procedure Laterality Date  . bletheroplasty    . CATARACT EXTRACTION W/ INTRAOCULAR LENS  IMPLANT, BILATERAL  2010  . CERVICAL LAMINECTOMY  2005  . CYSTOSCOPY WITH RETROGRADE PYELOGRAM, URETEROSCOPY AND STENT PLACEMENT Bilateral 11/16/2016   Procedure: CYSTOSCOPY WITH RETROGRADE PYELOGRAMS/CLOT EVACUATION;  Surgeon: Irine Seal, MD;  Location: [facility];  Service: Urology;  Laterality:  Bilateral;  . HAMMER TOE SURGERY  2013   right 2nd toe Dr. Mallie Mussel  . IVC FILTER PLACEMENT ([facility] HX)    . JOINT REPLACEMENT Bilateral 2002 Right, 1992 Left   knees  . TRANSURETHRAL RESECTION OF BLADDER TUMOR N/A 11/16/2016   Procedure: TRANSURETHRAL RESECTION OF BLADDER TUMOR  (TURBT);  Surgeon: Irine Seal, MD;  Location: [facility];  Service: Urology;  Laterality: N/A;    reports that she quit smoking about 28 years ago. She has a 25.00 pack-year smoking history. she has never used smokeless tobacco. She reports that she does not drink alcohol or use drugs. Social History   Socioeconomic History  . Marital status: Widowed    Spouse name: Not on file  . Number of children: Not on file  . Years of education: Not on file  . Highest education level: Not on file  Social Needs  . Financial resource strain: Not on file  . Food insecurity - worry: Not on file  . Food insecurity - inability: Not on file  . Transportation needs - medical: Not on file  . Transportation needs - non-medical: Not on file  Occupational History  . Not on file  Tobacco Use  . Smoking status: Former Smoker    Packs/day: 0.50    Years: 50.00    Pack years: 25.00    Last attempt to quit: 09/14/1988    Years since quitting: 28.6  . Smokeless tobacco: Never Used  . Tobacco comment: Smoked <1ppd  Substance and Sexual Activity  . Alcohol use: No    Alcohol/week: 0.6 oz    Types: 1 Glasses of wine per week    Frequency: Never  . Drug use: No  . Sexual activity: No  Other Topics Concern  . Not on file  Social History Narrative   Lives at [facility] since 10/07/2010, moved to [place] 01/16/2015   Widowed   Exercise class 3 times a week     Never smoked   Alcholol wine social    POA, Living Will, DNR      [facility]:   Primary MPOA:  Christell Faith (Daughter) - [phone]   Secondary MPOA:  Leler Brion [facility]) - [phone]      [name] (11/28/16):   Originally from [place] [facility]. Lived in [place] for 2 years from 1960-1961. No pets currently. No bird or mold exposure. 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     Functional Status Survey:    Family History  Problem Relation Age of Onset  . Alzheimer's disease Sister   . Emphysema Sister   .  Emphysema Father   . Rheumatologic disease Neg Hx     Health Maintenance  Topic Date Due  . DEXA SCAN  05/19/2017 (Originally 11/19/1994)  . TETANUS/TDAP  10/07/2022  . INFLUENZA VACCINE  Completed  . PNA vac Low Risk Adult  Completed    Allergies  Allergen Reactions  . Macrodantin [Nitrofurantoin]   . Morphine And Related Other (See Comments)    Altered mental state   . Sulfa Antibiotics Other (See Comments)    Reaction: unknown    Outpatient Encounter Medications as of 04/15/2017  Medication Sig  . acetaminophen (TYLENOL) 325 MG tablet Take 650 mg by mouth 2 (two) times daily.  Marland Kitchen acetaminophen (TYLENOL) 325 MG tablet Take 650 mg by mouth 2 (two) times daily as needed for mild pain or fever.  . Calcium Carbonate-Vitamin D3 (CALCIUM 600-D) 600-400 MG-UNIT TABS Take 1 tablet by mouth  daily.  . Cholecalciferol (VITAMIN D3) 5000 units TABS Take 1 tablet by mouth daily.   . clonazePAM (KLONOPIN) 0.5 MG tablet Take 1 tablet (0.5 mg total) by mouth daily. and as needed at bedtime for agitation or anxiety  . donepezil (ARICEPT) 10 MG tablet Take 10 mg by mouth at bedtime.   . DULoxetine (CYMBALTA) 30 MG capsule Take 30 mg by mouth daily.   Marland Kitchen guaiFENesin (MUCINEX) 600 MG 12 hr tablet Take 1 tablet (600 mg total) by mouth 2 (two) times daily.  Marland Kitchen lactose free nutrition (BOOST) LIQD Take 237 mLs by mouth 2 (two) times daily between meals.   Marland Kitchen levofloxacin (LEVAQUIN) 500 MG tablet Take 500 mg by mouth daily.  Marland Kitchen levothyroxine (SYNTHROID, LEVOTHROID) 100 MCG tablet Take 1 tablet (100 mcg total) by mouth daily before breakfast. For thyroid  . meloxicam (MOBIC) 7.5 MG tablet Take 7.5 mg by mouth daily as needed for pain.  . memantine (NAMENDA) 10 MG tablet Take 10 mg by mouth 2 (two) times daily.  . Multiple Vitamins-Minerals (THERA M PLUS PO) Take 1 tablet by mouth daily.   Marland Kitchen omega-3 acid ethyl esters (LOVAZA) 1 g capsule Take 1 g by mouth daily.  Marland Kitchen omeprazole (PRILOSEC) 20 MG capsule Take 20 mg  by mouth at bedtime.  . OXYGEN Inhale 2 L/min into the lungs as needed. To keep O2 Sat >90%  . phenytoin (DILANTIN) 100 MG ER capsule Take 200 mg by mouth at bedtime.   . phenytoin (DILANTIN) 100 MG ER capsule Take 100 mg by mouth daily.  . prochlorperazine (COMPAZINE) 10 MG tablet Take 1 tablet (10 mg total) by mouth every 6 (six) hours as needed for nausea or vomiting.  Marland Kitchen QUEtiapine (SEROQUEL) 25 MG tablet Take 12.5 mg by mouth at bedtime. At 8 pm  . saccharomyces boulardii (FLORASTOR) 250 MG capsule Take 250 mg by mouth 2 (two) times daily.  Marland Kitchen umeclidinium-vilanterol (ANORO ELLIPTA) 62.5-25 MCG/INH AEPB Inhale 1 puff into the lungs daily.  . Wound Dressings (SONAFINE) Apply 1 application topically 3 (three) times daily. APPLY TO RADIATION DERMATITIS 3 TIMES DAILY, GENTLY MASSAGE AREA UNTIL ABSORBED, CONTINUE APPLICATION UNTIL SKIN HAS FULLY RECOVERED   No facility-administered encounter medications on file as of 04/15/2017.     Review of Systems  Unable to perform ROS: Dementia (limited)  Constitutional: Positive for appetite change. Negative for chills, diaphoresis and fever.  HENT: Negative for congestion, rhinorrhea and trouble swallowing.   Respiratory: Positive for cough and shortness of breath. Negative for wheezing.   Cardiovascular: Negative for chest pain and palpitations.  Gastrointestinal: Negative for abdominal pain, diarrhea and vomiting.  Genitourinary: Negative for dysuria.  Musculoskeletal: Positive for gait problem. Negative for back pain.  Skin: Negative for rash.  Neurological: Positive for weakness. Negative for dizziness, seizures and headaches.  Psychiatric/Behavioral: Positive for confusion.    Vitals:   04/15/17 1246  BP: 104/69  Pulse: 67  Resp: 20  Temp: 98.1 F (36.7 C)  SpO2: 95%  Weight: 145 lb (65.8 kg)  Height: 5\' 7"  (1.702 m)   Body mass index is 22.71 kg/m. Physical Exam  Constitutional: No distress.  Elderly frail and thin built female    HENT:  Head: Normocephalic and atraumatic.  Mouth/Throat: Oropharynx is clear and moist.  Eyes: Conjunctivae and EOM are normal. Pupils are equal, round, and reactive to light. Right eye exhibits no discharge. Left eye exhibits no discharge.  Neck: Neck supple.  Cardiovascular: Normal rate and regular rhythm.  Pulmonary/Chest: Effort normal. No respiratory distress. She has wheezes. She has no rales.  Decreased air entry to both lung bases  Abdominal: Soft. Bowel sounds are normal. There is no tenderness. There is no guarding.  Musculoskeletal: She exhibits deformity. She exhibits no edema.  Can move all 4 extremities, unsteady gait, generalized weakness  Lymphadenopathy:    She has no cervical adenopathy.  Neurological: She is alert.  Oriented to self only.  Skin: Skin is warm and dry. No rash noted. She is not diaphoretic.  Psychiatric: She has a normal mood and affect.    Labs reviewed: Basic Metabolic Panel: Recent Labs    11/17/16 0454  04/06/17 1022 04/08/17 1506 04/08/17 2336 04/08/17 2337  NA 140   < > 142 139 138  --   K 3.6   < > 4.4 5.0 4.4  --   CL 103  --   --  104 110  --   CO2 31   < > 28 28 22   --   GLUCOSE 95   < > 91 157* 138*  --   BUN 10   < > 29.8* 33* 26*  --   CREATININE 0.53   < > 0.9 0.78 0.55 0.55  CALCIUM 8.5*   < > 9.6 8.9 7.7*  --    < > = values in this interval not displayed.   Liver Function Tests: Recent Labs    04/06/17 1022 04/08/17 1506 04/08/17 2336  AST 23 25 18   ALT 24 21 16   ALKPHOS 72 64 51  BILITOT 0.25 0.5 0.7  PROT 7.1 6.9 5.8*  ALBUMIN 3.2* 3.4* 2.7*   No results for input(s): LIPASE, AMYLASE in the last 8760 hours. No results for input(s): AMMONIA in the last 8760 hours. CBC: Recent Labs    03/30/17 0926 04/06/17 1022 04/08/17 1506 04/08/17 2337 04/10/17 0302 04/11/17 0636  WBC 4.0 5.5 9.3 6.4 6.4 3.8*  NEUTROABS 3.1 4.6 8.3*  --   --   --   HGB 12.4 11.6 10.9* 9.9* 9.2* 9.7*  HCT 38.0 35.1 32.9* 30.0*  28.0* 29.3*  MCV 99.2 99.7 100.3* 100.7* 101.4* 102.1*  PLT 170 110* 82* 71* 70* 79*   Cardiac Enzymes: Recent Labs    04/08/17 1506 04/08/17 2337 04/09/17 1138  TROPONINI <0.03 <0.03 <0.03   BNP: Invalid input(s): POCBNP Lab Results  Component Value Date   HGBA1C 5.4 03/08/2015   Lab Results  Component Value Date   TSH 0.533 11/13/2016   Lab Results  Component Value Date   VITAMINB12 810 01/27/2011   Lab Results  Component Value Date   FOLATE 16.4 01/27/2011   Lab Results  Component Value Date   IRON 41 (L) 01/27/2011   TIBC 159 (L) 01/27/2011   FERRITIN 406 (H) 01/27/2011    Imaging and Procedures obtained prior to SNF admission: No results found.  Assessment/Plan  Generalized weakness With deconditioning. Will have patient work with PT/OT as tolerated to regain strength and restore function.  Fall precautions are in place.  HCAP Negative chest xray in hospital. Within 48 hrs of discharge from hospital, chest xray shows bilateral infiltrates left > right. Will have her on levofloxacin 500 mg daily x 1 week. Monitor wbc and temp curve. SLP consult with aspiration risk with her cognitive impairment. Aspiration precautions.   Pancytopenia With her history of lung cancer and current treatment with chemotherapy and radiation. Monitor clinically for symptoms of infection and neutropenic fever. Continue florastor.   Stage  2 NSCLC Followed by oncology, on radiation and chemotherapy. Monitor clinically.   Chronic respiratory failure Supportive care. Continue oxygen and bronchodilators. If pt does not recover well from this recent hospitalization, will need to further review goals of care.   Seizure disorder Monitor, continue phenytoin  gerd Continue omeprazole and monitor  Dementia with behavior disturbance Continue to provide supportive care. Continue memantine and donepezil. continue quetiapine 12.5 mg daily with duloxetine.   Hypothyroidism Lab Results    Component Value Date   TSH 0.533 11/13/2016   Continue her levothyroxine and check TSH   Family/ staff Communication: reviewed care plan with patient and charge nurse.    Labs/tests ordered: cbc with diff, cmp, TSH 04/18/17  [name], MD Internal Medicine [facility] [address] [place], [place] [address] Cell Phone (Monday-Friday 8 am - 5 pm): [phone] On Call: [phone] and follow prompts after 5 pm and on weekends Office Phone: [phone] Office Fax: [phone]

## 2017-04-15 NOTE — Telephone Encounter (Signed)
appts given to pt son.

## 2017-04-15 NOTE — Telephone Encounter (Signed)
Possible re-admission to facility. This is a patient you were seeing at [facility] F/U is needed if patient was re-admitted to facility upon discharge. Hospital discharge from [facility] on 04/13/2017

## 2017-04-15 NOTE — Telephone Encounter (Signed)
Patient's son called and asked that the prescription for Mucinex could be sent in to [facility] instead of [facility].  Called the prescription in to [facility] in [place], [place].

## 2017-04-16 ENCOUNTER — Ambulatory Visit
Admission: RE | Admit: 2017-04-16 | Discharge: 2017-04-16 | Disposition: A | Discharge status: Home / Self Care | Payer: Medicare Other | Source: Ambulatory Visit | Attending: Radiation Oncology | Admitting: Radiation Oncology

## 2017-04-16 ENCOUNTER — Telehealth: Payer: Self-pay | Admitting: Oncology

## 2017-04-16 DIAGNOSIS — C3412 Malignant neoplasm of upper lobe, left bronchus or lung: Secondary | ICD-10-CM | POA: Diagnosis not present

## 2017-04-16 DIAGNOSIS — Z79899 Other long term (current) drug therapy: Secondary | ICD-10-CM | POA: Diagnosis not present

## 2017-04-16 DIAGNOSIS — Z87891 Personal history of nicotine dependence: Secondary | ICD-10-CM | POA: Diagnosis not present

## 2017-04-16 DIAGNOSIS — Z881 Allergy status to other antibiotic agents status: Secondary | ICD-10-CM | POA: Diagnosis not present

## 2017-04-16 DIAGNOSIS — Z9889 Other specified postprocedural states: Secondary | ICD-10-CM | POA: Diagnosis not present

## 2017-04-16 DIAGNOSIS — Z510 Encounter for antineoplastic radiation therapy: Secondary | ICD-10-CM | POA: Diagnosis not present

## 2017-04-16 DIAGNOSIS — C3492 Malignant neoplasm of unspecified part of left bronchus or lung: Secondary | ICD-10-CM | POA: Diagnosis not present

## 2017-04-16 NOTE — Telephone Encounter (Signed)
Called [facility] and spoke to patient's nurse.  Advised him to apply sonafine to patient's back as she has skin irritation from radiation in this area.  He verbalized understanding and agreement.

## 2017-04-17 ENCOUNTER — Ambulatory Visit
Admission: RE | Admit: 2017-04-17 | Discharge: 2017-04-17 | Disposition: A | Discharge status: Home / Self Care | Payer: Medicare Other | Source: Ambulatory Visit | Attending: Radiation Oncology | Admitting: Radiation Oncology

## 2017-04-17 DIAGNOSIS — Z79899 Other long term (current) drug therapy: Secondary | ICD-10-CM | POA: Diagnosis not present

## 2017-04-17 DIAGNOSIS — Z87891 Personal history of nicotine dependence: Secondary | ICD-10-CM | POA: Diagnosis not present

## 2017-04-17 DIAGNOSIS — C3412 Malignant neoplasm of upper lobe, left bronchus or lung: Secondary | ICD-10-CM | POA: Diagnosis not present

## 2017-04-17 DIAGNOSIS — Z881 Allergy status to other antibiotic agents status: Secondary | ICD-10-CM | POA: Diagnosis not present

## 2017-04-17 DIAGNOSIS — Z510 Encounter for antineoplastic radiation therapy: Secondary | ICD-10-CM | POA: Diagnosis not present

## 2017-04-17 DIAGNOSIS — C3492 Malignant neoplasm of unspecified part of left bronchus or lung: Secondary | ICD-10-CM | POA: Diagnosis not present

## 2017-04-17 DIAGNOSIS — Z9889 Other specified postprocedural states: Secondary | ICD-10-CM | POA: Diagnosis not present

## 2017-04-20 ENCOUNTER — Ambulatory Visit
Admission: RE | Admit: 2017-04-20 | Discharge: 2017-04-20 | Disposition: A | Discharge status: Home / Self Care | Payer: Medicare Other | Source: Ambulatory Visit | Attending: Radiation Oncology | Admitting: Radiation Oncology

## 2017-04-20 DIAGNOSIS — Z510 Encounter for antineoplastic radiation therapy: Secondary | ICD-10-CM | POA: Diagnosis not present

## 2017-04-20 DIAGNOSIS — Z881 Allergy status to other antibiotic agents status: Secondary | ICD-10-CM | POA: Diagnosis not present

## 2017-04-20 DIAGNOSIS — C3412 Malignant neoplasm of upper lobe, left bronchus or lung: Secondary | ICD-10-CM | POA: Diagnosis not present

## 2017-04-20 DIAGNOSIS — C3492 Malignant neoplasm of unspecified part of left bronchus or lung: Secondary | ICD-10-CM | POA: Diagnosis not present

## 2017-04-20 DIAGNOSIS — Z87891 Personal history of nicotine dependence: Secondary | ICD-10-CM | POA: Diagnosis not present

## 2017-04-20 DIAGNOSIS — Z79899 Other long term (current) drug therapy: Secondary | ICD-10-CM | POA: Diagnosis not present

## 2017-04-20 DIAGNOSIS — Z9889 Other specified postprocedural states: Secondary | ICD-10-CM | POA: Diagnosis not present

## 2017-04-20 LAB — HEPATIC FUNCTION PANEL
ALT: 12 (ref 7–35)
AST: 19 (ref 13–35)
Alkaline Phosphatase: 78 (ref 25–125)
Bilirubin, Total: 0.3

## 2017-04-20 LAB — BASIC METABOLIC PANEL
BUN: 22 — AB (ref 4–21)
Creatinine: 0.7 (ref 0.5–1.1)
GLUCOSE: 97
POTASSIUM: 4 (ref 3.4–5.3)
SODIUM: 141 (ref 137–147)

## 2017-04-20 LAB — CBC AND DIFFERENTIAL
HCT: 33 — AB (ref 36–46)
HEMOGLOBIN: 11.7 — AB (ref 12.0–16.0)
Platelets: 277 (ref 150–399)
WBC: 3.8

## 2017-04-20 LAB — TSH: TSH: 1.9 (ref 0.41–5.90)

## 2017-04-21 ENCOUNTER — Ambulatory Visit
Admission: RE | Admit: 2017-04-21 | Discharge: 2017-04-21 | Disposition: A | Discharge status: Home / Self Care | Payer: Medicare Other | Source: Ambulatory Visit | Attending: Radiation Oncology | Admitting: Radiation Oncology

## 2017-04-21 DIAGNOSIS — Z79899 Other long term (current) drug therapy: Secondary | ICD-10-CM | POA: Diagnosis not present

## 2017-04-21 DIAGNOSIS — C3412 Malignant neoplasm of upper lobe, left bronchus or lung: Secondary | ICD-10-CM | POA: Diagnosis not present

## 2017-04-21 DIAGNOSIS — C3492 Malignant neoplasm of unspecified part of left bronchus or lung: Secondary | ICD-10-CM | POA: Diagnosis not present

## 2017-04-21 DIAGNOSIS — Z510 Encounter for antineoplastic radiation therapy: Secondary | ICD-10-CM | POA: Diagnosis not present

## 2017-04-21 DIAGNOSIS — Z881 Allergy status to other antibiotic agents status: Secondary | ICD-10-CM | POA: Diagnosis not present

## 2017-04-21 DIAGNOSIS — Z9889 Other specified postprocedural states: Secondary | ICD-10-CM | POA: Diagnosis not present

## 2017-04-21 DIAGNOSIS — Z87891 Personal history of nicotine dependence: Secondary | ICD-10-CM | POA: Diagnosis not present

## 2017-04-22 ENCOUNTER — Ambulatory Visit
Admission: RE | Admit: 2017-04-22 | Discharge: 2017-04-22 | Disposition: A | Discharge status: Home / Self Care | Payer: Medicare Other | Source: Ambulatory Visit | Attending: Radiation Oncology | Admitting: Radiation Oncology

## 2017-04-22 ENCOUNTER — Encounter: Payer: Self-pay | Admitting: Radiation Oncology

## 2017-04-22 DIAGNOSIS — Z510 Encounter for antineoplastic radiation therapy: Secondary | ICD-10-CM | POA: Diagnosis not present

## 2017-04-22 DIAGNOSIS — Z881 Allergy status to other antibiotic agents status: Secondary | ICD-10-CM | POA: Diagnosis not present

## 2017-04-22 DIAGNOSIS — Z9889 Other specified postprocedural states: Secondary | ICD-10-CM | POA: Diagnosis not present

## 2017-04-22 DIAGNOSIS — Z87891 Personal history of nicotine dependence: Secondary | ICD-10-CM | POA: Diagnosis not present

## 2017-04-22 DIAGNOSIS — C3492 Malignant neoplasm of unspecified part of left bronchus or lung: Secondary | ICD-10-CM | POA: Diagnosis not present

## 2017-04-22 DIAGNOSIS — Z79899 Other long term (current) drug therapy: Secondary | ICD-10-CM | POA: Diagnosis not present

## 2017-04-22 DIAGNOSIS — C3412 Malignant neoplasm of upper lobe, left bronchus or lung: Secondary | ICD-10-CM | POA: Diagnosis not present

## 2017-04-24 ENCOUNTER — Telehealth: Payer: Self-pay

## 2017-04-24 ENCOUNTER — Encounter: Payer: Self-pay | Admitting: Internal Medicine

## 2017-04-24 ENCOUNTER — Non-Acute Institutional Stay (SKILLED_NURSING_FACILITY): Payer: Medicare Other | Admitting: Internal Medicine

## 2017-04-24 DIAGNOSIS — R410 Disorientation, unspecified: Secondary | ICD-10-CM

## 2017-04-24 DIAGNOSIS — J9612 Chronic respiratory failure with hypercapnia: Secondary | ICD-10-CM

## 2017-04-24 DIAGNOSIS — W19XXXA Unspecified fall, initial encounter: Secondary | ICD-10-CM | POA: Diagnosis not present

## 2017-04-24 DIAGNOSIS — J9611 Chronic respiratory failure with hypoxia: Secondary | ICD-10-CM

## 2017-04-24 DIAGNOSIS — D61818 Other pancytopenia: Secondary | ICD-10-CM | POA: Diagnosis not present

## 2017-04-24 DIAGNOSIS — R5381 Other malaise: Secondary | ICD-10-CM

## 2017-04-24 DIAGNOSIS — F0391 Unspecified dementia with behavioral disturbance: Secondary | ICD-10-CM | POA: Diagnosis not present

## 2017-04-24 NOTE — Telephone Encounter (Signed)
Patient has been seen today for these concerns. Son has been updated. Thank you.

## 2017-04-24 NOTE — Telephone Encounter (Signed)
Patient's son called requesting a call from Dr.Pandey to give a status update on his mother. Patient had a drastic change within the last 2 days and [name] would like to know what's going on.  Please advise

## 2017-04-24 NOTE — Progress Notes (Signed)
Provider:  [name] MD  Location:  [facility] Room Number: 25 Place of Service:  SNF (31)  PCP: [name], MD Patient Care Team: [name], MD as PCP - General (Internal Medicine) [name], [facility] [name], MD as Consulting Physician (Orthopedic Surgery) [name], MD as Consulting Physician (Physical Medicine and Rehabilitation) Melina Schools, MD as Consulting Physician (Orthopedic Surgery) [name], MD as Consulting Physician (Otolaryngology) [name], [name], NP as Nurse Practitioner (Family Medicine)  Extended Emergency Contact Information Primary Emergency Contact: [name] Address: [address], [place] Phone: [phone] Relation: Son Secondary Emergency Contact: [name] [place] Phone: [phone] Mobile Phone: [phone] Relation: Daughter  Code Status: DNR  Goals of Care: Advanced Directive information Advanced Directives 04/24/2017  Does Patient Have a Medical Advance Directive? Yes  Type of Paramedic of [place];Living will;Out of facility DNR (pink MOST or yellow form)  Does patient want to make changes to medical advance directive? No - Patient declined  Copy of [place] in Chart? Yes  Pre-existing out of facility DNR order (yellow form or pink MOST form) Yellow form placed in chart (order not valid for inpatient use)    Chief Complaint  Patient presents with  . Acute Visit    Family Concerns     HPI: Patient is a 83 y.o. female seen today for acute concern from family. Son is present at bedside. He mentions that mom has not been eating good for 1-2 days. Today when he came to visit her, she did not recognize him. Per nursing, she has been in bed mostly since yesterday afternoon. She has refused care and medication and has kept her oxygen off. Per nursing, this is not new  for her and has done this at times in past. 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 She was in the hospital from 11/21-11/26 with sepsis from ? HCAP and received iv fluids. She is currently in SNF to undergo rehabilitation with goal to return to assisted living facility. She remains pleasantly confused this visit. She is following simple instructions and on further interview, is able to recognize her son at bedside. Of note, she had a fall this afternoon per nursing, did not hit her head or lose consciousness. She has some redness on her right cheek bone but no other injury reported.    Past Medical History:  Diagnosis Date  . Anemia, unspecified 03/18/2011  . Anxiety state, unspecified 01/2011  . Blood in stool 06/15/2012  . Corns and callosities 07/01/2011  . Dementia   . Diverticulosis 02/2011  . Dizziness   . DJD (degenerative joint disease)   . Dysuria 06/17/2011  . Hemorrhoids 02/2011  . Low back pain   . Lung cancer ([place])    left  . Mitral valve problem thickened   thickened  . Osteoarthritis of both hands 10/09/2015  . Osteoporosis 02/2011  . Other abnormal blood chemistry 0/30/2012  . Other acquired deformity of toe 12/16/2011  . Pain in joint, site unspecified 01/2012  . 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 Sacroiliitis, not elsewhere classified ([place]) 05/2011  . Seizures ([place])    Remotely  . Unspecified hypothyroidism 01/2011  . 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 Vitamin D deficiency 01/2011   Past Surgical History:  Procedure Laterality Date  . bletheroplasty    . CATARACT EXTRACTION W/ INTRAOCULAR LENS  IMPLANT, BILATERAL  2010  . CERVICAL LAMINECTOMY  2005  . CYSTOSCOPY WITH RETROGRADE PYELOGRAM, URETEROSCOPY AND STENT PLACEMENT Bilateral 11/16/2016   Procedure: CYSTOSCOPY WITH  RETROGRADE PYELOGRAMS/CLOT EVACUATION;  Surgeon: Irine Seal, MD;  Location: [facility];  Service: Urology;  Laterality: Bilateral;  . HAMMER TOE SURGERY  2013   right 2nd toe Dr. Mallie Mussel  . IVC FILTER PLACEMENT ([facility] HX)    . JOINT REPLACEMENT Bilateral 2002 Right, 1992 Left   knees  . TRANSURETHRAL RESECTION OF BLADDER TUMOR N/A 11/16/2016   Procedure: TRANSURETHRAL RESECTION OF BLADDER TUMOR (TURBT);  Surgeon: Irine Seal, MD;  Location: [facility];  Service: Urology;  Laterality: N/A;    reports that she quit smoking about 28 years ago. She has a 25.00 pack-year smoking history. she has never used smokeless tobacco. She reports that she does not drink alcohol or use drugs. Social History   Socioeconomic History  . Marital status: Widowed    Spouse name: Not on file  . Number of children: Not on file  . Years of education: Not on file  . Highest education level: Not on file  Social Needs  . Financial resource strain: Not on file  . Food insecurity - worry: Not on file  . Food insecurity - inability: Not on file  . Transportation needs - medical: Not on file  . Transportation needs - non-medical: Not on file  Occupational History  . Not on file  Tobacco Use  . Smoking status: Former Smoker    Packs/day: 0.50    Years: 50.00    Pack years: 25.00    Last attempt to quit: 09/14/1988    Years since quitting: 28.6  . Smokeless tobacco: Never Used  . Tobacco comment: Smoked <1ppd  Substance and Sexual Activity  . Alcohol use: No    Alcohol/week: 0.6 oz    Types: 1 Glasses of wine per week    Frequency: Never  . Drug use: No  . Sexual activity: No  Other Topics Concern  . Not on file  Social History Narrative   Lives at [facility] since 10/07/2010, moved to [place] 01/16/2015   Widowed   Exercise class 3 times a week     Never smoked   Alcholol wine social    POA, Living Will, DNR      [facility]/CC:   Primary MPOA:  Christell Faith (Daughter) - [phone]   Secondary  MPOA:  Rosaria Kubin [facility]) - [phone]      [facility] (11/28/16):   Originally from [facility]. Lived in [place] for 2 years from 1960-1961. No pets currently. No bird or mold exposure. 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     Functional Status Survey:    Family History  Problem Relation Age of Onset  . Alzheimer's disease Sister   . Emphysema Sister   . Emphysema Father   . Rheumatologic disease Neg Hx     Health Maintenance  Topic Date Due  . DEXA SCAN  05/19/2017 (Originally 11/19/1994)  . TETANUS/TDAP  10/07/2022  . INFLUENZA VACCINE  Completed  . PNA vac Low Risk Adult  Completed    Allergies  Allergen Reactions  . Macrodantin [Nitrofurantoin]   . Morphine And Related Other (See Comments)    Altered mental state   .  Sulfa Antibiotics Other (See Comments)    Reaction: unknown    Outpatient Encounter Medications as of 04/24/2017  Medication Sig  . acetaminophen (TYLENOL) 325 MG tablet Take 650 mg by mouth 2 (two) times daily.  Marland Kitchen acetaminophen (TYLENOL) 325 MG tablet Take 650 mg by mouth 2 (two) times daily as needed for mild pain or fever.  Marland Kitchen alum & mag hydroxide-simeth (MINTOX) 621-308-65 MG/5ML suspension Take 30 mLs by mouth every 4 (four) hours as needed for indigestion or heartburn.  . Calcium Carbonate-Vitamin D3 (CALCIUM 600-D) 600-400 MG-UNIT TABS Take 1 tablet by mouth daily.  . Cholecalciferol (VITAMIN D3) 5000 units TABS Take 1 tablet by mouth daily.   . clonazePAM (KLONOPIN) 0.5 MG tablet Take 1 tablet (0.5 mg total) by mouth daily. and as needed at bedtime for agitation or anxiety  . donepezil (ARICEPT) 10 MG tablet Take 10 mg by mouth at bedtime.   . DULoxetine (CYMBALTA) 30 MG capsule Take 30 mg by mouth daily.   Marland Kitchen guaiFENesin (MUCINEX) 600 MG 12 hr tablet Take 1 tablet (600 mg total) by mouth 2 (two) times daily.  Marland Kitchen ipratropium-albuterol (DUONEB) 0.5-2.5 (3) MG/3ML SOLN Take 3 mLs by nebulization every 6 (six) hours as needed.  .  lactose free nutrition (BOOST) LIQD Take 237 mLs by mouth 2 (two) times daily between meals.   Marland Kitchen levothyroxine (SYNTHROID, LEVOTHROID) 100 MCG tablet Take 1 tablet (100 mcg total) by mouth daily before breakfast. For thyroid  . meloxicam (MOBIC) 7.5 MG tablet Take 7.5 mg by mouth daily as needed for pain.  . memantine (NAMENDA) 10 MG tablet Take 10 mg by mouth 2 (two) times daily.  . Multiple Vitamins-Minerals (THERA M PLUS PO) Take 1 tablet by mouth daily.   Marland Kitchen omega-3 acid ethyl esters (LOVAZA) 1 g capsule Take 1 g by mouth daily.  Marland Kitchen omeprazole (PRILOSEC) 20 MG capsule Take 20 mg by mouth at bedtime.  . ondansetron (ZOFRAN) 4 MG tablet Take 4 mg by mouth every 8 (eight) hours as needed for nausea or vomiting.  . OXYGEN Inhale 2 L/min into the lungs as needed. To keep O2 Sat >90%  . phenytoin (DILANTIN) 100 MG ER capsule Take 200 mg by mouth at bedtime.   . phenytoin (DILANTIN) 100 MG ER capsule Take 100 mg by mouth daily.  . QUEtiapine (SEROQUEL) 25 MG tablet Take 12.5 mg by mouth at bedtime. At 8 pm  . umeclidinium-vilanterol (ANORO ELLIPTA) 62.5-25 MCG/INH AEPB Inhale 1 puff into the lungs daily.  . Wound Dressings (SONAFINE) Apply 1 application topically 3 (three) times daily. APPLY TO RADIATION DERMATITIS 3 TIMES DAILY, GENTLY MASSAGE AREA UNTIL ABSORBED, CONTINUE APPLICATION UNTIL SKIN HAS FULLY RECOVERED  . [DISCONTINUED] prochlorperazine (COMPAZINE) 10 MG tablet Take 1 tablet (10 mg total) by mouth every 6 (six) hours as needed for nausea or vomiting.   No facility-administered encounter medications on file as of 04/24/2017.     Review of Systems  Unable to perform ROS: Dementia (very limited with her dementia)  Constitutional: Positive for appetite change. Negative for chills and fever.  HENT: Positive for hearing loss. Negative for congestion, mouth sores, sore throat and trouble swallowing.   Respiratory: Positive for shortness of breath. Negative for cough, choking and wheezing.         On chronic oxygen, breathing appears comfortable  Cardiovascular: Negative for chest pain.  Gastrointestinal: Negative for abdominal pain, diarrhea and vomiting.  Genitourinary:       Denies dysuria  Musculoskeletal: Positive  for gait problem. Negative for back pain.  Skin: Negative for rash and wound.  Neurological: Positive for weakness. Negative for dizziness, light-headedness and headaches.  Psychiatric/Behavioral: Positive for confusion. Negative for hallucinations.    Vitals:   04/24/17 1528  BP: 104/66  Pulse: 66  Resp: 20  Temp: (!) 97.3 F (36.3 C)  TempSrc: Oral  SpO2: 94%  Weight: 135 lb 1.6 oz (61.3 kg)  Height: 5\' 7"  (1.702 m)   Body mass index is 21.16 kg/m.    Physical Exam  Constitutional: No distress.  Thin built and frail female  HENT:  Head: Normocephalic and atraumatic.  Nose: Nose normal.  Mouth/Throat: Oropharynx is clear and moist. No oropharyngeal exudate.  Eyes: Conjunctivae and EOM are normal. Pupils are equal, round, and reactive to light. Right eye exhibits no discharge. Left eye exhibits no discharge.  Neck: Neck supple.  Cardiovascular: Normal rate and regular rhythm.  Pulmonary/Chest: Effort normal. No respiratory distress. She has no wheezes. She has no rales.  Poor air movement, no labored breathing, on oxygen by nasal canula  Abdominal: Soft. Bowel sounds are normal. There is no tenderness. There is no guarding.  Musculoskeletal: She exhibits deformity. She exhibits no edema.  Able to move all 4 extremities, generalized weakness  Lymphadenopathy:    She has no cervical adenopathy.  Neurological: She is alert.  Oriented to person only, following simple instruction  Skin: Skin is warm and dry. She is not diaphoretic.  Redness to right cheek area, non tender  Psychiatric:  Pleasantly confused    Labs reviewed: Basic Metabolic Panel: Recent Labs    11/17/16 0454  04/06/17 1022 04/08/17 1506 04/08/17 2336 04/08/17 2337  04/20/17  NA 140   < > 142 139 138  --  141  K 3.6   < > 4.4 5.0 4.4  --  4.0  CL 103  --   --  104 110  --   --   CO2 31   < > 28 28 22   --   --   GLUCOSE 95   < > 91 157* 138*  --   --   BUN 10   < > 29.8* 33* 26*  --  22*  CREATININE 0.53   < > 0.9 0.78 0.55 0.55 0.7  CALCIUM 8.5*   < > 9.6 8.9 7.7*  --   --    < > = values in this interval not displayed.   Liver Function Tests: Recent Labs    04/06/17 1022 04/08/17 1506 04/08/17 2336 04/20/17  AST 23 25 18 19   ALT 24 21 16 12   ALKPHOS 72 64 51 78  BILITOT 0.25 0.5 0.7  --   PROT 7.1 6.9 5.8*  --   ALBUMIN 3.2* 3.4* 2.7*  --    No results for input(s): LIPASE, AMYLASE in the last 8760 hours. No results for input(s): AMMONIA in the last 8760 hours. CBC: Recent Labs    03/30/17 0926 04/06/17 1022 04/08/17 1506 04/08/17 2337 04/10/17 0302 04/11/17 0636 04/20/17  WBC 4.0 5.5 9.3 6.4 6.4 3.8* 3.8  NEUTROABS 3.1 4.6 8.3*  --   --   --   --   HGB 12.4 11.6 10.9* 9.9* 9.2* 9.7* 11.7*  HCT 38.0 35.1 32.9* 30.0* 28.0* 29.3* 33*  MCV 99.2 99.7 100.3* 100.7* 101.4* 102.1*  --   PLT 170 110* 82* 71* 70* 79* 277   Cardiac Enzymes: Recent Labs    04/08/17 1506 04/08/17 2337 04/09/17 1138  TROPONINI <0.03 <0.03 <0.03   BNP: Invalid input(s): POCBNP Lab Results  Component Value Date   HGBA1C 5.4 03/08/2015   Lab Results  Component Value Date   TSH 1.90 04/20/2017   Lab Results  Component Value Date   VITAMINB12 810 01/27/2011   Lab Results  Component Value Date   FOLATE 16.4 01/27/2011   Lab Results  Component Value Date   IRON 41 (L) 01/27/2011   TIBC 159 (L) 01/27/2011   FERRITIN 406 (H) 01/27/2011    Imaging and Procedures obtained prior to SNF admission: No results found.  Assessment/Plan  Fall initial encounter Had a fall this noon, no apparent injury, neurochecks for now, VS checks. Has unsteady gait and is deconditioned. With dementia, remains high fall risk. Cbc, cmp  Change in mental  state Per son did not recognize him this afternoon. Per nursing, refusing care at times. Removes oxygen as times too. At present able to recognize her son. Has dementia and had a fall this afternoon. Likely multifactorial with her dementia, recent fall adding to confusion, deconditioning, hypoxia with her chronic respiratory failure. Continue neurochecks with her recent fall. Lab work as above.   Physical deconditioning Persists. Will have her work with physical therapy and occupational therapy team to help with gait training and muscle strengthening exercises.fall precautions. Skin care. Encourage to be out of bed.   Chronic respiratory failure Continue o2 and supportive care. Completed antibiotic course. Continue with SLP to follow. Continue bronchodilators.   Pancytopenia Check cbc next lab with her recent chemoradiation  Dementia with behavior disturbance Provide supportive care. Explained to son that there can be sundowning and with being deconditioned and her medical co-morbities along with dementia, there can be waxing and waning of her memory. She at present is able to recognize her son and appears neurlogically intact. Son is reassured.    Family/ staff Communication: reviewed plan with pt, her son and charge nurse   Labs/tests ordered: cbc, cmp  [name], MD Internal Medicine [address] [place], [place] [address] Cell Phone (Monday-Friday 8 am - 5 pm): [phone] On Call: [phone] and follow prompts after 5 pm and on weekends Office Phone: [phone] Office Fax: [phone]

## 2017-04-27 LAB — BASIC METABOLIC PANEL
BUN: 27 — AB (ref 4–21)
CREATININE: 0.9 (ref 0.5–1.1)
Glucose: 160
POTASSIUM: 4.2 (ref 3.4–5.3)
SODIUM: 141 (ref 137–147)

## 2017-04-27 LAB — CBC AND DIFFERENTIAL
HCT: 38 (ref 36–46)
HEMOGLOBIN: 12.6 (ref 12.0–16.0)
Platelets: 301 (ref 150–399)
WBC: 5.2

## 2017-04-27 LAB — HEPATIC FUNCTION PANEL
ALK PHOS: 77 (ref 25–125)
ALT: 9 (ref 7–35)
AST: 17 (ref 13–35)
Bilirubin, Total: 0.4

## 2017-04-29 NOTE — Progress Notes (Signed)
  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  Patient denied havign any pain, or dizziness. Shortness of breath only occurred with activity. She reports having frequent dry cough, or a cough with white sputum.The skin on her left back is intact, her appetite is good. No significant esophagitis symptoms.  Plan: The patient has completed radiation treatment. The patient will return to radiation oncology clinic for routine followup in one month. I advised them to call or return sooner if they have any questions or concerns related to their recovery or treatment.  -----------------------------------  Blair Promise, PhD, MD   This document serves as a record of services personally performed by Gery Pray MD. It was created on his behalf by Delton Coombes, a trained medical scribe. The creation of this record is based on the scribe's personal observations and the provider's statements to them.

## 2017-04-30 ENCOUNTER — Ambulatory Visit (HOSPITAL_BASED_OUTPATIENT_CLINIC_OR_DEPARTMENT_OTHER): Payer: Medicare Other | Admitting: Internal Medicine

## 2017-04-30 ENCOUNTER — Telehealth: Payer: Self-pay | Admitting: Internal Medicine

## 2017-04-30 ENCOUNTER — Other Ambulatory Visit (HOSPITAL_BASED_OUTPATIENT_CLINIC_OR_DEPARTMENT_OTHER): Payer: Medicare Other

## 2017-04-30 ENCOUNTER — Encounter: Payer: Self-pay | Admitting: Internal Medicine

## 2017-04-30 DIAGNOSIS — C3411 Malignant neoplasm of upper lobe, right bronchus or lung: Secondary | ICD-10-CM | POA: Diagnosis not present

## 2017-04-30 DIAGNOSIS — C3492 Malignant neoplasm of unspecified part of left bronchus or lung: Secondary | ICD-10-CM

## 2017-04-30 DIAGNOSIS — C3490 Malignant neoplasm of unspecified part of unspecified bronchus or lung: Secondary | ICD-10-CM

## 2017-04-30 LAB — CBC WITH DIFFERENTIAL/PLATELET
BASO%: 0.3 % (ref 0.0–2.0)
BASOS ABS: 0 10*3/uL (ref 0.0–0.1)
EOS ABS: 0.2 10*3/uL (ref 0.0–0.5)
EOS%: 2.4 % (ref 0.0–7.0)
HCT: 39.6 % (ref 34.8–46.6)
HGB: 12.6 g/dL (ref 11.6–15.9)
LYMPH%: 12.4 % — AB (ref 14.0–49.7)
MCH: 33.9 pg (ref 25.1–34.0)
MCHC: 31.8 g/dL (ref 31.5–36.0)
MCV: 106.5 fL — AB (ref 79.5–101.0)
MONO#: 0.2 10*3/uL (ref 0.1–0.9)
MONO%: 3.4 % (ref 0.0–14.0)
NEUT%: 81.5 % — AB (ref 38.4–76.8)
NEUTROS ABS: 5.8 10*3/uL (ref 1.5–6.5)
PLATELETS: 208 10*3/uL (ref 145–400)
RBC: 3.72 10*6/uL (ref 3.70–5.45)
RDW: 17 % — ABNORMAL HIGH (ref 11.2–14.5)
WBC: 7.1 10*3/uL (ref 3.9–10.3)
lymph#: 0.9 10*3/uL (ref 0.9–3.3)

## 2017-04-30 LAB — COMPREHENSIVE METABOLIC PANEL
ALT: 11 U/L (ref 0–55)
ANION GAP: 9 meq/L (ref 3–11)
AST: 19 U/L (ref 5–34)
Albumin: 3.4 g/dL — ABNORMAL LOW (ref 3.5–5.0)
Alkaline Phosphatase: 81 U/L (ref 40–150)
BILIRUBIN TOTAL: 0.3 mg/dL (ref 0.20–1.20)
BUN: 31.3 mg/dL — ABNORMAL HIGH (ref 7.0–26.0)
CHLORIDE: 104 meq/L (ref 98–109)
CO2: 29 meq/L (ref 22–29)
Calcium: 9.6 mg/dL (ref 8.4–10.4)
Creatinine: 0.9 mg/dL (ref 0.6–1.1)
EGFR: 57 mL/min/{1.73_m2} — AB (ref >60)
GLUCOSE: 92 mg/dL (ref 70–140)
POTASSIUM: 4.5 meq/L (ref 3.5–5.1)
SODIUM: 142 meq/L (ref 136–145)
TOTAL PROTEIN: 7.4 g/dL (ref 6.4–8.3)

## 2017-04-30 NOTE — Progress Notes (Signed)
[place] Telephone:([phone]) [phone]   Fax:([phone]  OFFICE PROGRESS NOTE  [name], MD [place] [address]  DIAGNOSIS: Stage IIA (T2a, N1, M0) non-small cell lung cancer, squamous cell carcinoma presented with lingual mass as well as left hilar adenopathy diagnosed in September 2018.  PRIOR THERAPY: None  CURRENT THERAPY: A course of concurrent chemoradiation with weekly carboplatin for AUC of 2 and paclitaxel 45 MG/M2. Status post 5 cycle.  Last dose of chemotherapy was given April 06, 2017 and last radiation fraction was April 22, 2017.  INTERVAL HISTORY: Rebecca [name] 83 y.o. female returns to the clinic today for follow-up visit accompanied by her son and daughter-in-law.  The patient is currently a resident of skilled nursing facility.  She was admitted to [facility] after cycle #5 of her treatment with questionable pneumonia and poor p.o. intake and dehydration.  She is feeling much better today.  Her cough is improving.  She denied having any chest pain but continues to have shortness of breath at baseline increased with exertion.  She has no nausea, vomiting, diarrhea or constipation.  She is here today for evaluation and recommendation regarding her condition.  MEDICAL HISTORY: Past Medical History:  Diagnosis Date  . Anemia, unspecified 03/18/2011  . Anxiety state, unspecified 01/2011  . Blood in stool 06/15/2012  . Corns and callosities 07/01/2011  . Dementia   . Diverticulosis 02/2011  . Dizziness   . DJD (degenerative joint disease)   . Dysuria 06/17/2011  . Hemorrhoids 02/2011  . Low back pain   . Lung cancer ([place])    left  . Mitral valve problem thickened   thickened  . Osteoarthritis of both hands 10/09/2015  . Osteoporosis 02/2011  . Other abnormal blood chemistry 0/30/2012  . Other acquired deformity of toe 12/16/2011  . Pain in joint, site unspecified 01/2012  . Personality change due to conditions  classified elsewhere 01/2011  . Sacroiliitis, not elsewhere classified ([place]) 05/2011  . Seizures ([place])    Remotely  . Unspecified hypothyroidism 01/2011  . 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 Vitamin D deficiency 01/2011    ALLERGIES:  is allergic to macrodantin [nitrofurantoin]; morphine and related; and sulfa antibiotics.  MEDICATIONS:  Current Outpatient Medications  Medication Sig Dispense Refill  . acetaminophen (TYLENOL) 325 MG tablet Take 650 mg by mouth 2 (two) times daily.    . Calcium Carbonate-Vitamin D3 (CALCIUM 600-D) 600-400 MG-UNIT TABS Take 1 tablet by mouth daily.    . clonazePAM (KLONOPIN) 0.5 MG tablet Take 1 tablet (0.5 mg total) by mouth daily. and as needed at bedtime for agitation or anxiety 6 tablet 0  . donepezil (ARICEPT) 10 MG tablet Take 10 mg by mouth at bedtime.     . lactose free nutrition (BOOST) LIQD Take 237 mLs by mouth 2 (two) times daily between meals.     Marland Kitchen levothyroxine (SYNTHROID, LEVOTHROID) 100 MCG tablet Take 1 tablet (100 mcg total) by mouth daily before breakfast. For thyroid 90 tablet 3  . memantine (NAMENDA) 10 MG tablet Take 10 mg by mouth 2 (two) times daily.    Marland Kitchen omega-3 acid ethyl esters (LOVAZA) 1 g capsule Take 1 g by mouth daily.    Marland Kitchen omeprazole (PRILOSEC) 20 MG capsule Take 20 mg by mouth at bedtime.    . OXYGEN Inhale 2 L/min into the lungs as needed. To keep O2 Sat >90%    .  phenytoin (DILANTIN) 100 MG ER capsule Take 200 mg by mouth at bedtime.     . phenytoin (DILANTIN) 100 MG ER capsule Take 100 mg by mouth daily.    . QUEtiapine (SEROQUEL) 25 MG tablet Take 12.5 mg by mouth at bedtime. At 8 pm    . umeclidinium-vilanterol (ANORO ELLIPTA) 62.5-25 MCG/INH AEPB Inhale 1 puff into the lungs daily. 1 each 6  . Wound Dressings (SONAFINE) Apply 1 application topically 3 (three) times daily. APPLY TO RADIATION DERMATITIS 3 TIMES DAILY, GENTLY MASSAGE AREA UNTIL ABSORBED, CONTINUE APPLICATION UNTIL SKIN HAS  FULLY RECOVERED    . acetaminophen (TYLENOL) 325 MG tablet Take 650 mg by mouth 2 (two) times daily as needed for mild pain or fever.    Marland Kitchen alum & mag hydroxide-simeth (MINTOX) 417-408-14 MG/5ML suspension Take 30 mLs by mouth every 4 (four) hours as needed for indigestion or heartburn.    . Cholecalciferol (VITAMIN D3) 5000 units TABS Take 1 tablet by mouth daily.     . DULoxetine (CYMBALTA) 30 MG capsule Take 30 mg by mouth daily.     Marland Kitchen guaiFENesin (MUCINEX) 600 MG 12 hr tablet Take 1 tablet (600 mg total) by mouth 2 (two) times daily. 30 tablet 0  . ipratropium-albuterol (DUONEB) 0.5-2.5 (3) MG/3ML SOLN Take 3 mLs by nebulization every 6 (six) hours as needed.    . meloxicam (MOBIC) 7.5 MG tablet Take 7.5 mg by mouth daily as needed for pain.    . Multiple Vitamins-Minerals (THERA M PLUS PO) Take 1 tablet by mouth daily.     . ondansetron (ZOFRAN) 4 MG tablet Take 4 mg by mouth every 8 (eight) hours as needed for nausea or vomiting.     No current facility-administered medications for this visit.     SURGICAL HISTORY:  Past Surgical History:  Procedure Laterality Date  . bletheroplasty    . CATARACT EXTRACTION W/ INTRAOCULAR LENS  IMPLANT, BILATERAL  2010  . CERVICAL LAMINECTOMY  2005  . CYSTOSCOPY WITH RETROGRADE PYELOGRAM, URETEROSCOPY AND STENT PLACEMENT Bilateral 11/16/2016   Procedure: CYSTOSCOPY WITH RETROGRADE PYELOGRAMS/CLOT EVACUATION;  Surgeon: Irine Seal, MD;  Location: [facility];  Service: Urology;  Laterality: Bilateral;  . HAMMER TOE SURGERY  2013   right 2nd toe Dr. Mallie Mussel  . IVC FILTER PLACEMENT ([facility] HX)    . JOINT REPLACEMENT Bilateral 2002 Right, 1992 Left   knees  . TRANSURETHRAL RESECTION OF BLADDER TUMOR N/A 11/16/2016   Procedure: TRANSURETHRAL RESECTION OF BLADDER TUMOR (TURBT);  Surgeon: Irine Seal, MD;  Location: [facility];  Service: Urology;  Laterality: N/A;    REVIEW OF SYSTEMS:  A comprehensive review of systems was negative except for: Constitutional: positive  for fatigue Respiratory: positive for cough and dyspnea on exertion   PHYSICAL EXAMINATION: General appearance: alert, cooperative, fatigued and no distress Head: Normocephalic, without obvious abnormality, atraumatic Neck: no adenopathy, no JVD, supple, symmetrical, trachea midline and thyroid not enlarged, symmetric, no tenderness/mass/nodules Lymph nodes: Cervical, supraclavicular, and axillary nodes normal. Resp: clear to auscultation bilaterally Back: symmetric, no curvature. ROM normal. No CVA tenderness. Cardio: regular rate and rhythm, S1, S2 normal, no murmur, click, rub or gallop GI: soft, non-tender; bowel sounds normal; no masses,  no organomegaly Extremities: extremities normal, atraumatic, no cyanosis or edema  ECOG PERFORMANCE STATUS: 1 - Symptomatic but completely ambulatory  Blood pressure (!) 101/58, pulse 62, temperature 98 F (36.7 C), temperature source Oral, resp. rate 18, weight 132 lb 11.2 oz (60.2 kg), SpO2 96 %.  LABORATORY DATA: Lab Results  Component Value Date   WBC 7.1 04/30/2017   HGB 12.6 04/30/2017   HCT 39.6 04/30/2017   MCV 106.5 (H) 04/30/2017   PLT 208 04/30/2017      Chemistry      Component Value Date/Time   NA 142 04/30/2017 0940   K 4.5 04/30/2017 0940   CL 110 04/08/2017 2336   CO2 29 04/30/2017 0940   BUN 31.3 (H) 04/30/2017 0940   CREATININE 0.9 04/30/2017 0940   GLU 97 04/20/2017      Component Value Date/Time   CALCIUM 9.6 04/30/2017 0940   ALKPHOS 81 04/30/2017 0940   AST 19 04/30/2017 0940   ALT 11 04/30/2017 0940   BILITOT 0.30 04/30/2017 0940       RADIOGRAPHIC STUDIES: Dg Chest 2 View  Result Date: 04/12/2017 CLINICAL DATA:  Productive cough. EXAM: CHEST  2 VIEW COMPARISON:  Radiographs of April 08, 2017. FINDINGS: Stable cardiomegaly. Stable interstitial densities are noted throughout both lungs most consistent with pulmonary fibrosis. Mildly increased bibasilar densities are noted concerning for subsegmental  atelectasis or edema with mild associated pleural effusions. Bony thorax is unremarkable. IMPRESSION: Stable findings concerning for pulmonary fibrosis. Mildly increased bibasilar densities are noted concerning for subsegmental atelectasis or edema with mild associated pleural effusions. Electronically Signed   By: Marijo Conception, M.D.   On: 04/12/2017 09:37   Dg Chest 2 View  Result Date: 04/08/2017 CLINICAL DATA:  Chest pain and lung cancer EXAM: CHEST  2 VIEW COMPARISON:  01/29/2017 FINDINGS: Low volume chest with coarse interstitial opacities. When accounting for lower volumes and interstitial crowding no acute superimposed opacity. No effusion or pneumothorax. Chronic cardiomegaly. Advanced disc degeneration with scoliosis. IMPRESSION: 1. Pulmonary fibrosis. Accounting for low lung volumes and interstitial crowding, no acute opacity identified. 2. A lingular nodule that was biopsied 01/29/2017 is not discretely visible. Electronically Signed   By: Monte Fantasia M.D.   On: 04/08/2017 15:52   Dg Swallowing Func-speech Pathology  Result Date: 04/12/2017 Objective Swallowing Evaluation: Type of Study: MBS-Modified Barium Swallow Study  Patient Details Name: Rebecca [name] MRN: [id number] Date of Birth: [birth year] Today's Date: 04/12/2017 Time: SLP Start Time (ACUTE ONLY): 2725 -SLP Stop Time (ACUTE ONLY): 0935 SLP Time Calculation (min) (ACUTE ONLY): 15 min Past Medical History: Past Medical History: Diagnosis Date . Anemia, unspecified 03/18/2011 . Anxiety state, unspecified 01/2011 . Blood in stool 06/15/2012 . Corns and callosities 07/01/2011 . Dementia  . Diverticulosis 02/2011 . Dizziness  . DJD (degenerative joint disease)  . Dysuria 06/17/2011 . Hemorrhoids 02/2011 . Low back pain  . Lung cancer ([place])   left . Mitral valve problem thickened  thickened . Osteoarthritis of both hands 10/09/2015 . Osteoporosis 02/2011 . Other abnormal blood chemistry 0/30/2012 . Other acquired deformity of toe 12/16/2011 .  Pain in joint, site unspecified 01/2012 . Personality change due to conditions classified elsewhere 01/2011 . Sacroiliitis, not elsewhere classified ([place]) 05/2011 . Seizures ([place])   Remotely . Unspecified hypothyroidism 01/2011 . 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 Vitamin D deficiency 01/2011 Past Surgical History: Past Surgical History: Procedure Laterality Date . bletheroplasty   . CATARACT EXTRACTION W/ INTRAOCULAR LENS  IMPLANT, BILATERAL  2010 . CERVICAL LAMINECTOMY  2005 . CYSTOSCOPY WITH RETROGRADE PYELOGRAM, URETEROSCOPY AND STENT PLACEMENT Bilateral 11/16/2016  Procedure: CYSTOSCOPY WITH RETROGRADE PYELOGRAMS/CLOT EVACUATION;  Surgeon: Irine Seal, MD;  Location: [facility];  Service: Urology;  Laterality: Bilateral; . [place]  2013  right 2nd toe Dr. Mallie Mussel . IVC FILTER PLACEMENT ([facility] HX)   . JOINT REPLACEMENT Bilateral 2002 Right, 1992 Left  knees . TRANSURETHRAL RESECTION OF BLADDER TUMOR N/A 11/16/2016  Procedure: TRANSURETHRAL RESECTION OF BLADDER TUMOR (TURBT);  Surgeon: Irine Seal, MD;  Location: [facility];  Service: Urology;  Laterality: N/A; HPI: 83 years old white female with unresectable stage II a non-small cell lung cancer, squamous cell carcinoma and was undergoing a course of concurrent chemoradiation with weekly carboplatin and paclitaxel status post 5 cycles. Admitted with complaints of chest pain. CXR 11/21 Pulmonary fibrosis. Accounting for low lung volumes and interstitial crowding, no acute opacity identified. A lingular nodule that was biopsied 01/29/2017 is not discretely visible.  No Data Recorded Assessment / Plan / Recommendation CHL IP CLINICAL IMPRESSIONS 04/12/2017 Clinical Impression Pt's oral control, manipulation, mastication and transit were all within functional limits. Swallow was appropriately triggered at pt's valleculae with adequate laryngeal elevation, epiglottic inversion to protect airway and pharyngeal constriction. No laryngeal intrusion  with any consistency and no post swallow residue. Esophagus briefly viewed without observation of residue across consistencies including barium pill. Recommend pt continue Dys 3 for energy conservation, thin liquids and pills with liquids. No further ST needed.  SLP Visit Diagnosis Dysphagia, unspecified (R13.10) Attention and concentration deficit following -- Frontal lobe and executive function deficit following -- Impact on safety and function Mild aspiration risk   CHL IP TREATMENT RECOMMENDATION 04/12/2017 Treatment Recommendations No treatment recommended at this time   Prognosis 04/12/2017 Prognosis for Safe Diet Advancement -- Barriers to Reach Goals Cognitive deficits Barriers/Prognosis Comment -- CHL IP DIET RECOMMENDATION 04/12/2017 SLP Diet Recommendations Dysphagia 3 (Mech soft) solids;Thin liquid Liquid Administration via Straw;Cup Medication Administration Whole meds with liquid Compensations Slow rate;Small sips/bites Postural Changes Seated upright at 90 degrees;Remain semi-upright after after feeds/meals (Comment)   CHL IP OTHER RECOMMENDATIONS 04/12/2017 Recommended Consults -- Oral Care Recommendations Oral care BID Other Recommendations --   CHL IP FOLLOW UP RECOMMENDATIONS 04/12/2017 Follow up Recommendations None   No flowsheet data found.     CHL IP ORAL PHASE 04/12/2017 Oral Phase WFL Oral - Pudding Teaspoon -- Oral - Pudding Cup -- Oral - Honey Teaspoon -- Oral - Honey Cup -- Oral - Nectar Teaspoon -- Oral - Nectar Cup -- Oral - Nectar Straw -- Oral - Thin Teaspoon -- Oral - Thin Cup -- Oral - Thin Straw -- Oral - Puree -- Oral - Mech Soft -- Oral - Regular -- Oral - Multi-Consistency -- Oral - Pill -- Oral Phase - Comment --  CHL IP PHARYNGEAL PHASE 04/12/2017 Pharyngeal Phase WFL Pharyngeal- Pudding Teaspoon -- Pharyngeal -- Pharyngeal- Pudding Cup -- Pharyngeal -- Pharyngeal- Honey Teaspoon -- Pharyngeal -- Pharyngeal- Honey Cup -- Pharyngeal -- Pharyngeal- Nectar Teaspoon -- Pharyngeal  -- Pharyngeal- Nectar Cup -- Pharyngeal -- Pharyngeal- Nectar Straw -- Pharyngeal -- Pharyngeal- Thin Teaspoon -- Pharyngeal -- Pharyngeal- Thin Cup -- Pharyngeal -- Pharyngeal- Thin Straw -- Pharyngeal -- Pharyngeal- Puree -- Pharyngeal -- Pharyngeal- Mechanical Soft -- Pharyngeal -- Pharyngeal- Regular -- Pharyngeal -- Pharyngeal- Multi-consistency -- Pharyngeal -- Pharyngeal- Pill -- Pharyngeal -- Pharyngeal Comment --  CHL IP CERVICAL ESOPHAGEAL PHASE 04/12/2017 Cervical Esophageal Phase WFL Pudding Teaspoon -- Pudding Cup -- Honey Teaspoon -- Honey Cup -- Nectar Teaspoon -- Nectar Cup -- Nectar Straw -- Thin Teaspoon -- Thin Cup -- Thin Straw -- Puree -- Mechanical Soft -- Regular -- Multi-consistency -- Pill -- Cervical Esophageal Comment -- No flowsheet data found. Darliss Cheney  Willis 04/12/2017, 9:47 AM Orbie Pyo Colvin Caroli.Ed Engineer, agricultural [phone]               ASSESSMENT AND PLAN: this is a very pleasant 83 years old white female with unresectable stage IIA non-small cell lung cancer, squamous cell carcinoma. She completed a course of concurrent chemoradiation with weekly carboplatin and paclitaxel status post 5 cycles. The patient is feeling much better today and recovering from her recent hospitalization for dehydration and questionable pneumonia. I recommended for the patient to have repeat CT scan of the chest performed in 2-3 weeks and I will see her back for follow-up visit after the scan for restaging of her disease and discussion of any other treatment options. She was encouraged to increase her oral intake. The patient was advised to call immediately if she has any concerning symptoms in the interval. The patient voices understanding of current disease status and treatment options and is in agreement with the current care plan.  All questions were answered. The patient knows to call the clinic with any problems, questions or concerns. We can certainly see the patient much sooner if  necessary.  I spent 10 minutes counseling the patient face to face. The total time spent in the appointment was 15 minutes.  Disclaimer: This note was dictated with voice recognition software. Similar sounding words can inadvertently be transcribed and may not be corrected upon review.

## 2017-04-30 NOTE — Telephone Encounter (Signed)
Scheduled appt per 12/13 los - Gave patient AVS And calender per [facility] radiology to contact patient with scan schedule.

## 2017-05-14 ENCOUNTER — Encounter: Payer: Self-pay | Admitting: Family

## 2017-05-14 ENCOUNTER — Non-Acute Institutional Stay (SKILLED_NURSING_FACILITY): Payer: Medicare Other | Admitting: Family

## 2017-05-14 DIAGNOSIS — F0391 Unspecified dementia with behavioral disturbance: Secondary | ICD-10-CM | POA: Diagnosis not present

## 2017-05-14 DIAGNOSIS — G40909 Epilepsy, unspecified, not intractable, without status epilepticus: Secondary | ICD-10-CM | POA: Diagnosis not present

## 2017-05-14 DIAGNOSIS — E039 Hypothyroidism, unspecified: Secondary | ICD-10-CM

## 2017-05-14 DIAGNOSIS — J9611 Chronic respiratory failure with hypoxia: Secondary | ICD-10-CM

## 2017-05-14 DIAGNOSIS — M150 Primary generalized (osteo)arthritis: Secondary | ICD-10-CM | POA: Diagnosis not present

## 2017-05-14 DIAGNOSIS — M159 Polyosteoarthritis, unspecified: Secondary | ICD-10-CM

## 2017-05-14 NOTE — Progress Notes (Signed)
Location:  [facility] Room Number: 25 Place of Service:  SNF (31) Provider: Camesha Farooq FNP-C   [name], MD  Patient Care Team: [name], MD as PCP - General (Internal Medicine) [name], [facility] [name], MD as Consulting Physician (Orthopedic Surgery) [name], MD as Consulting Physician (Physical Medicine and Rehabilitation) Melina Schools, MD as Consulting Physician (Orthopedic Surgery) [name], MD as Consulting Physician (Otolaryngology) Nusaybah Ivie, [name], NP as Nurse Practitioner (Family Medicine)  Extended Emergency Contact Information Primary Emergency Contact: [name] Address: [address]          [name] of [place] Phone: [phone] Relation: Son Secondary Emergency Contact: [name] [place] Phone: [phone] Mobile Phone: [phone] Relation: Daughter  Code Status: DNR Goals of care: Advanced Directive information Advanced Directives 05/14/2017  Does Patient Have a Medical Advance Directive? Yes  Type of Paramedic of [place];Out of facility DNR (pink MOST or yellow form);Living will  Does patient want to make changes to medical advance directive? -  Copy of [place] in Chart? Yes  Pre-existing out of facility DNR order (yellow form or pink MOST form) Yellow form placed in chart (order not valid for inpatient use)     Chief Complaint  Patient presents with  . Medical Management of Chronic Issues    routine visit    HPI:  Pt is a 83 y.o. female seen today [place] for medical management of chronic diseases.she has a medical history of chronic respiratory failure with fibrosis,Non small cell Lung cancer status post chemotherapy and Radiation,Hypothyroidism,OA,Dementia with behavioral disturbance,GERD among other conditions.she is seen in her room lying in the bed.She is pleasantly confused  unable to provide HPI and ROS just answered simple questions during visit. Facility Nurse reports no new concerns.She has had progressive weight loss and continues to have poor appetite. No fall episode since last episode on 04/24/2017.   Past Medical History:  Diagnosis Date  . Anemia, unspecified 03/18/2011  . Anxiety state, unspecified 01/2011  . Blood in stool 06/15/2012  . Corns and callosities 07/01/2011  . Dementia   . Diverticulosis 02/2011  . Dizziness   . DJD (degenerative joint disease)   . Dysuria 06/17/2011  . Hemorrhoids 02/2011  . Low back pain   . Lung cancer ([place])    left  . Mitral valve problem thickened   thickened  . Osteoarthritis of both hands 10/09/2015  . Osteoporosis 02/2011  . Other abnormal blood chemistry 0/30/2012  . Other acquired deformity of toe 12/16/2011  . Pain in joint, site unspecified 01/2012  . 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 Sacroiliitis, not elsewhere classified ([place]) 05/2011  . Seizures ([place])    Remotely  . Unspecified hypothyroidism 01/2011  . 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 Vitamin D deficiency 01/2011   Past Surgical History:  Procedure Laterality Date  . bletheroplasty    . CATARACT EXTRACTION W/ INTRAOCULAR LENS  IMPLANT, BILATERAL  2010  . CERVICAL LAMINECTOMY  2005  . CYSTOSCOPY WITH RETROGRADE PYELOGRAM, URETEROSCOPY AND STENT PLACEMENT Bilateral 11/16/2016   Procedure: CYSTOSCOPY WITH RETROGRADE PYELOGRAMS/CLOT EVACUATION;  Surgeon: Irine Seal, MD;  Location: [facility];  Service: Urology;  Laterality: Bilateral;  . HAMMER TOE SURGERY  2013   right 2nd toe Dr. Mallie Mussel  . IVC FILTER PLACEMENT ([facility] HX)    . JOINT REPLACEMENT Bilateral 2002 Right, 1992  Left   knees  . TRANSURETHRAL RESECTION OF BLADDER TUMOR N/A 11/16/2016   Procedure: TRANSURETHRAL RESECTION OF BLADDER TUMOR (TURBT);  Surgeon: Irine Seal, MD;  Location: [facility];  Service: Urology;  Laterality: N/A;     Allergies  Allergen Reactions  . Macrodantin [Nitrofurantoin]   . Morphine And Related Other (See Comments)    Altered mental state   . Sulfa Antibiotics Other (See Comments)    Reaction: unknown    Allergies as of 05/14/2017      Reactions   Macrodantin [nitrofurantoin]    Morphine And Related Other (See Comments)   Altered mental state    Sulfa Antibiotics Other (See Comments)   Reaction: unknown      Medication List        Accurate as of 05/14/17  4:46 PM. Always use your most recent med list.          acetaminophen 325 MG tablet Commonly known as:  TYLENOL Take 650 mg by mouth 2 (two) times daily.   acetaminophen 325 MG tablet Commonly known as:  TYLENOL Take 650 mg by mouth 2 (two) times daily as needed for mild pain or fever.   CALCIUM 600-D 600-400 MG-UNIT Tabs Generic drug:  Calcium Carbonate-Vitamin D3 Take 1 tablet by mouth daily.   clonazePAM 0.5 MG tablet Commonly known as:  KLONOPIN Take 1 tablet (0.5 mg total) by mouth daily. and as needed at bedtime for agitation or anxiety   donepezil 10 MG tablet Commonly known as:  ARICEPT Take 10 mg by mouth at bedtime.   DULoxetine 30 MG capsule Commonly known as:  CYMBALTA Take 30 mg by mouth daily.   guaiFENesin 600 MG 12 hr tablet Commonly known as:  MUCINEX Take 1 tablet (600 mg total) by mouth 2 (two) times daily.   ipratropium-albuterol 0.5-2.5 (3) MG/3ML Soln Commonly known as:  DUONEB Take 3 mLs by nebulization every 6 (six) hours as needed.   lactose free nutrition Liqd Take 237 mLs by mouth 3 (three) times daily with meals.   levothyroxine 100 MCG tablet Commonly known as:  SYNTHROID, LEVOTHROID Take 1 tablet (100 mcg total) by mouth daily before breakfast. For thyroid   meloxicam 7.5 MG tablet Commonly known as:  MOBIC Take 7.5 mg by mouth daily as needed for pain.   memantine 10 MG tablet Commonly known as:  NAMENDA Take 10 mg by mouth 2 (two) times daily.   [place]  200-200-20 MG/5ML suspension Generic drug:  alum & mag hydroxide-simeth Take 30 mLs by mouth every 4 (four) hours as needed for indigestion or heartburn.   omega-3 acid ethyl esters 1 g capsule Commonly known as:  LOVAZA Take 1 g by mouth daily.   omeprazole 20 MG capsule Commonly known as:  PRILOSEC Take 20 mg by mouth at bedtime.   ondansetron 4 MG tablet Commonly known as:  ZOFRAN Take 4 mg by mouth every 8 (eight) hours as needed for nausea or vomiting.   OXYGEN Inhale 2 L/min into the lungs as needed. To keep O2 Sat >90%   phenytoin 100 MG ER capsule Commonly known as:  DILANTIN Take 200 mg by mouth at bedtime.   phenytoin 100 MG ER capsule Commonly known as:  DILANTIN Take 100 mg by mouth daily.   QUEtiapine 25 MG tablet Commonly known as:  SEROQUEL Take 12.5 mg by mouth at bedtime. At 8 pm   THERA M PLUS PO Take 1 tablet by mouth daily.   umeclidinium-vilanterol 62.5-25 MCG/INH  Aepb Commonly known as:  ANORO ELLIPTA Inhale 1 puff into the lungs daily.   Vitamin D3 5000 units Tabs Take 1 tablet by mouth daily.       Review of Systems  Unable to perform ROS: Dementia    Immunization History  Administered Date(s) Administered  . Influenza Split 02/18/2017  . Influenza Whole 03/17/2007, 02/16/2009, 01/30/2010, 02/16/2013  . Influenza-Unspecified 03/02/2014, 02/22/2015, 02/28/2016  . PPD Test 07/09/2010, 02/04/2015, 04/04/2015, 06/04/2015  . Pneumococcal Conjugate-13 01/13/2017  . Pneumococcal Polysaccharide-23 05/19/2005, 05/23/2013  . Td 05/19/2005  . Tetanus 10/06/2012  . Zoster 05/19/2012   Pertinent  Health Maintenance Due  Topic Date Due  . DEXA SCAN  05/19/2017 (Originally 11/19/1994)  . INFLUENZA VACCINE  Completed  . PNA vac Low Risk Adult  Completed   Fall Risk  03/13/2017 02/18/2017 01/05/2017 10/09/2015 07/10/2015  Falls in the past year? No Yes No No Yes  Number falls in past yr: - 2 or more - - 1  Injury with Fall? - No - - Yes  Comment  - - - - scape on her left leg  Risk Factor Category  - High Fall Risk - - -    Vitals:   05/14/17 1135  BP: 115/62  Pulse: 81  Resp: 20  Temp: 97.6 F (36.4 C)  SpO2: 90%  Weight: 132 lb (59.9 kg)  Height: 5\' 7"  (1.702 m)   Body mass index is 20.67 kg/m. Physical Exam  Constitutional: She appears well-developed.  Frail Elderly lying in bed in no acute distress   HENT:  Head: Normocephalic.  Right Ear: External ear normal.  Left Ear: External ear normal.  Mouth/Throat: Oropharynx is clear and moist. No oropharyngeal exudate.  HOH   Eyes: Conjunctivae and EOM are normal. Pupils are equal, round, and reactive to light. Right eye exhibits no discharge. Left eye exhibits no discharge. No scleral icterus.  Wears eye glasses   Neck: Normal range of motion. No JVD present.  Cardiovascular: Normal rate, regular rhythm and intact distal pulses. Exam reveals no gallop and no friction rub.  No murmur heard. Pulmonary/Chest: Effort normal. No respiratory distress. She has no wheezes. She has no rales.  Bilateral diminished air entry though not following instruction to take deep breaths. Resp unlabored. Oxygen via nasal cannula in place.    Abdominal: Soft. Bowel sounds are normal. She exhibits no distension. There is no tenderness. There is no rebound and no guarding.  Musculoskeletal: She exhibits no edema or tenderness.  Unsteady gait uses FWW  Lymphadenopathy:    She has no cervical adenopathy.  Neurological: She is alert. Coordination normal.  Pleasantly confused at her baseline.   Skin: Skin is warm and dry. No rash noted. No erythema.  Skin intact   Psychiatric: She has a normal mood and affect.    Labs reviewed: Recent Labs    11/17/16 0454  04/08/17 1506 04/08/17 2336 04/08/17 2337 04/20/17 04/30/17 0940  NA 140   < > 139 138  --  141 142  K 3.6   < > 5.0 4.4  --  4.0 4.5  CL 103  --  104 110  --   --   --   CO2 31   < > 28 22  --   --  29  GLUCOSE 95   < > 157*  138*  --   --  92  BUN 10   < > 33* 26*  --  22* 31.3*  CREATININE 0.53   < >  0.78 0.55 0.55 0.7 0.9  CALCIUM 8.5*   < > 8.9 7.7*  --   --  9.6   < > = values in this interval not displayed.   Recent Labs    04/08/17 1506 04/08/17 2336 04/20/17 04/30/17 0940  AST 25 18 19 19   ALT 21 16 12 11   ALKPHOS 64 51 78 81  BILITOT 0.5 0.7  --  0.30  PROT 6.9 5.8*  --  7.4  ALBUMIN 3.4* 2.7*  --  3.4*   Recent Labs    04/06/17 1022 04/08/17 1506  04/10/17 0302 04/11/17 0636 04/20/17 04/30/17 0940  WBC 5.5 9.3   < > 6.4 3.8* 3.8 7.1  NEUTROABS 4.6 8.3*  --   --   --   --  5.8  HGB 11.6 10.9*   < > 9.2* 9.7* 11.7* 12.6  HCT 35.1 32.9*   < > 28.0* 29.3* 33* 39.6  MCV 99.7 100.3*   < > 101.4* 102.1*  --  106.5*  PLT 110* 82*   < > 70* 79* 277 208   < > = values in this interval not displayed.   Lab Results  Component Value Date   TSH 1.90 04/20/2017   Lab Results  Component Value Date   HGBA1C 5.4 03/08/2015   Lab Results  Component Value Date   CHOL 235 (H) 03/06/2009   HDL 108.40 03/06/2009   LDLDIRECT 108.2 03/06/2009    Significant Diagnostic Results in last 30 days:  No results found.  Assessment/Plan 1. Hypothyroidism Lab Results  Component Value Date   TSH 1.90 04/20/2017  Continue on levothyroxine 100 mcg tablet daily.monitor TSH level.   2. Seizure disorder  No recent seizure activity reported.continue on Dilantin 200 mg at bedtime and 100 mg capsule daily.continue to monitor.   3. Chronic respiratory failure with hypoxia Hx fibrosis with Non small Cell squamous carcinoma. Continue on oxygen via nasal cannula.recently completed course of chemotherapy and radiation.awaiting follow up body scan CT. Continue to follow up with Oncology.continue on Dunoneb every 6 Hrs PRN and Mucinex.    4. Dementia with behavioral disturbance Pleasantly confused at her baseline.continue to encourage oral intake. Assist with ADL's as needed. Skin care.Fall and safety  precautions.continue on Seroquel 12.5 mg tablet at bedtime, Aricept 10 mg tablet and Namenda 10 mg tablet twice daily.     5. Primary osteoarthritis involving multiple joints Stable on mobic 7.5 mg tablet as needed and Tylenol 650 mg tablet twice daily.continue to monitor.    Family/ staff Communication: Reviewed plan of care with patient and facility Nurse.  Labs/tests ordered: None   Auden Tatar C Jolinda Pinkstaff, NP

## 2017-05-15 ENCOUNTER — Encounter (HOSPITAL_COMMUNITY): Payer: Self-pay

## 2017-05-15 ENCOUNTER — Ambulatory Visit (HOSPITAL_COMMUNITY)
Admission: RE | Admit: 2017-05-15 | Discharge: 2017-05-15 | Disposition: A | Discharge status: Home / Self Care | Payer: Medicare Other | Source: Ambulatory Visit | Attending: Internal Medicine | Admitting: Internal Medicine

## 2017-05-15 ENCOUNTER — Other Ambulatory Visit (HOSPITAL_BASED_OUTPATIENT_CLINIC_OR_DEPARTMENT_OTHER): Payer: Medicare Other

## 2017-05-15 DIAGNOSIS — Z923 Personal history of irradiation: Secondary | ICD-10-CM | POA: Insufficient documentation

## 2017-05-15 DIAGNOSIS — C3412 Malignant neoplasm of upper lobe, left bronchus or lung: Secondary | ICD-10-CM | POA: Diagnosis not present

## 2017-05-15 DIAGNOSIS — I700 Atherosclerosis of aorta: Secondary | ICD-10-CM | POA: Diagnosis not present

## 2017-05-15 DIAGNOSIS — C3490 Malignant neoplasm of unspecified part of unspecified bronchus or lung: Secondary | ICD-10-CM

## 2017-05-15 DIAGNOSIS — C3411 Malignant neoplasm of upper lobe, right bronchus or lung: Secondary | ICD-10-CM

## 2017-05-15 DIAGNOSIS — J8410 Pulmonary fibrosis, unspecified: Secondary | ICD-10-CM | POA: Insufficient documentation

## 2017-05-15 LAB — COMPREHENSIVE METABOLIC PANEL
ALT: 12 U/L (ref 0–55)
ANION GAP: 9 meq/L (ref 3–11)
AST: 16 U/L (ref 5–34)
Albumin: 2.8 g/dL — ABNORMAL LOW (ref 3.5–5.0)
Alkaline Phosphatase: 75 U/L (ref 40–150)
BILIRUBIN TOTAL: 0.22 mg/dL (ref 0.20–1.20)
BUN: 24.3 mg/dL (ref 7.0–26.0)
CALCIUM: 9.5 mg/dL (ref 8.4–10.4)
CHLORIDE: 105 meq/L (ref 98–109)
CO2: 27 mEq/L (ref 22–29)
CREATININE: 0.9 mg/dL (ref 0.6–1.1)
EGFR: 55 mL/min/{1.73_m2} — ABNORMAL LOW (ref >60)
Glucose: 93 mg/dl (ref 70–140)
Potassium: 4.7 mEq/L (ref 3.5–5.1)
Sodium: 140 mEq/L (ref 136–145)
TOTAL PROTEIN: 7.5 g/dL (ref 6.4–8.3)

## 2017-05-15 LAB — CBC WITH DIFFERENTIAL/PLATELET
BASO%: 0.2 % (ref 0.0–2.0)
Basophils Absolute: 0 10*3/uL (ref 0.0–0.1)
EOS%: 2.4 % (ref 0.0–7.0)
Eosinophils Absolute: 0.2 10*3/uL (ref 0.0–0.5)
HEMATOCRIT: 37 % (ref 34.8–46.6)
HEMOGLOBIN: 11.8 g/dL (ref 11.6–15.9)
LYMPH#: 0.8 10*3/uL — AB (ref 0.9–3.3)
LYMPH%: 8.4 % — ABNORMAL LOW (ref 14.0–49.7)
MCH: 33.5 pg (ref 25.1–34.0)
MCHC: 31.9 g/dL (ref 31.5–36.0)
MCV: 105.1 fL — ABNORMAL HIGH (ref 79.5–101.0)
MONO#: 1.1 10*3/uL — AB (ref 0.1–0.9)
MONO%: 11.8 % (ref 0.0–14.0)
NEUT%: 77.2 % — ABNORMAL HIGH (ref 38.4–76.8)
NEUTROS ABS: 7.1 10*3/uL — AB (ref 1.5–6.5)
PLATELETS: 342 10*3/uL (ref 145–400)
RBC: 3.52 10*6/uL — ABNORMAL LOW (ref 3.70–5.45)
RDW: 15.8 % — AB (ref 11.2–14.5)
WBC: 9.1 10*3/uL (ref 3.9–10.3)

## 2017-05-15 MED ORDER — IOPAMIDOL (ISOVUE-300) INJECTION 61%
75.0000 mL | Freq: Once | INTRAVENOUS | Status: AC | PRN
  Administered 2017-05-15: 75 mL via INTRAVENOUS

## 2017-05-15 MED ORDER — IOPAMIDOL (ISOVUE-300) INJECTION 61%
INTRAVENOUS | Status: AC
  Administered 2017-05-15: 75 mL via INTRAVENOUS
  Filled 2017-05-15: qty 75

## 2017-05-16 ENCOUNTER — Encounter (HOSPITAL_COMMUNITY): Payer: Self-pay

## 2017-05-16 ENCOUNTER — Emergency Department (HOSPITAL_COMMUNITY)
Admission: EM | Admit: 2017-05-16 | Discharge: 2017-05-16 | Disposition: A | Discharge status: Home / Self Care | Payer: Medicare Other | Attending: Emergency Medicine | Admitting: Emergency Medicine

## 2017-05-16 ENCOUNTER — Other Ambulatory Visit: Payer: Self-pay

## 2017-05-16 DIAGNOSIS — S0181XA Laceration without foreign body of other part of head, initial encounter: Secondary | ICD-10-CM | POA: Insufficient documentation

## 2017-05-16 DIAGNOSIS — Z96653 Presence of artificial knee joint, bilateral: Secondary | ICD-10-CM | POA: Diagnosis not present

## 2017-05-16 DIAGNOSIS — Z87891 Personal history of nicotine dependence: Secondary | ICD-10-CM | POA: Insufficient documentation

## 2017-05-16 DIAGNOSIS — W010XXA Fall on same level from slipping, tripping and stumbling without subsequent striking against object, initial encounter: Secondary | ICD-10-CM

## 2017-05-16 DIAGNOSIS — S0180XA Unspecified open wound of other part of head, initial encounter: Secondary | ICD-10-CM | POA: Diagnosis not present

## 2017-05-16 DIAGNOSIS — Z85118 Personal history of other malignant neoplasm of bronchus and lung: Secondary | ICD-10-CM | POA: Insufficient documentation

## 2017-05-16 DIAGNOSIS — Y929 Unspecified place or not applicable: Secondary | ICD-10-CM | POA: Insufficient documentation

## 2017-05-16 DIAGNOSIS — S61412A Laceration without foreign body of left hand, initial encounter: Secondary | ICD-10-CM | POA: Diagnosis not present

## 2017-05-16 DIAGNOSIS — E039 Hypothyroidism, unspecified: Secondary | ICD-10-CM | POA: Insufficient documentation

## 2017-05-16 DIAGNOSIS — W0110XA Fall on same level from slipping, tripping and stumbling with subsequent striking against unspecified object, initial encounter: Secondary | ICD-10-CM | POA: Diagnosis not present

## 2017-05-16 DIAGNOSIS — F0390 Unspecified dementia without behavioral disturbance: Secondary | ICD-10-CM | POA: Insufficient documentation

## 2017-05-16 DIAGNOSIS — G8911 Acute pain due to trauma: Secondary | ICD-10-CM | POA: Diagnosis not present

## 2017-05-16 DIAGNOSIS — Y999 Unspecified external cause status: Secondary | ICD-10-CM | POA: Diagnosis not present

## 2017-05-16 DIAGNOSIS — S0993XA Unspecified injury of face, initial encounter: Secondary | ICD-10-CM | POA: Diagnosis present

## 2017-05-16 DIAGNOSIS — Y9389 Activity, other specified: Secondary | ICD-10-CM | POA: Diagnosis not present

## 2017-05-16 DIAGNOSIS — S66929S Laceration of unspecified muscle, fascia and tendon at wrist and hand level, unspecified hand, sequela: Secondary | ICD-10-CM | POA: Diagnosis not present

## 2017-05-16 DIAGNOSIS — Z79899 Other long term (current) drug therapy: Secondary | ICD-10-CM | POA: Diagnosis not present

## 2017-05-16 DIAGNOSIS — S098XXA Other specified injuries of head, initial encounter: Secondary | ICD-10-CM | POA: Diagnosis not present

## 2017-05-16 MED ORDER — LIDOCAINE-EPINEPHRINE (PF) 2 %-1:200000 IJ SOLN
10.0000 mL | Freq: Once | INTRAMUSCULAR | Status: DC
  Filled 2017-05-16: qty 20

## 2017-05-16 NOTE — Discharge Instructions (Signed)
It was our pleasure to provide your ER care today - we hope that you feel better.  Fall precautions - ambulate with walker and assistance.    Have sutures removed, by your doctor/nurse or urgent care, in 8-10 days.  Return to ER if worse, new symptoms, new or severe pain, other concern.

## 2017-05-16 NOTE — ED Notes (Signed)
Bed: [facility] Expected date:  Expected time:  Means of arrival:  Comments: 

## 2017-05-16 NOTE — ED Triage Notes (Signed)
Staff at [facility] heard pt. Fall. They entered her room immediately to find her awake, having fallen while ambulating with her walker with the only identifiable injury of a lac. At left upper forehead area. She is awake, alert and confused at her baseline on O2 per n.c. (which she is on 24/7, which we maintain.

## 2017-05-16 NOTE — ED Provider Notes (Signed)
[facility] DEPT Provider Note   CSN: [id number] Arrival date & time: 05/16/17  1542     History   Chief Complaint No chief complaint on file.   HPI Rebecca [name] is a 83 y.o. female.  Patient with hx advanced dementia, presents via ems s/p fall. Pt is supposed to walk w walker, w assistance, but tried to walk on own, fell forward. Hit head. No loc noted. Laceration to forehead and abrasion to left hand/palm. Patient denies other pain or injury. No vomiting. Mental status reported as c/w baseline. Pt denies headache. Denies neck or back pain.    The history is provided by the patient.    Past Medical History:  Diagnosis Date  . Anemia, unspecified 03/18/2011  . Anxiety state, unspecified 01/2011  . Blood in stool 06/15/2012  . Corns and callosities 07/01/2011  . Dementia   . Diverticulosis 02/2011  . Dizziness   . DJD (degenerative joint disease)   . Dysuria 06/17/2011  . Hemorrhoids 02/2011  . Low back pain   . Lung cancer ([place])    left  . Mitral valve problem thickened   thickened  . Osteoarthritis of both hands 10/09/2015  . Osteoporosis 02/2011  . Other abnormal blood chemistry 0/30/2012  . Other acquired deformity of toe 12/16/2011  . Pain in joint, site unspecified 01/2012  . 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 Sacroiliitis, not elsewhere classified ([place]) 05/2011  . Seizures ([place])    Remotely  . Unspecified hypothyroidism 01/2011  . 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 Vitamin D deficiency 01/2011    Patient Active Problem List   Diagnosis Date Noted  . Hypotension 04/08/2017  . 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 Encounter for antineoplastic chemotherapy 02/25/2017  . Goals of care, counseling/discussion 02/25/2017  . Squamous NSCLC ([place]) 01/29/2017  . ILD (interstitial lung disease) ([place]) 11/28/2016  . Arthritis 11/28/2016  . Gross  hematuria 11/16/2016  . GERD (gastroesophageal reflux disease) 05/20/2016  . Torus palatinus 04/18/2016  . Osteoarthritis, multiple sites 10/09/2015  . Back pain 06/01/2015  . Unstable gait 04/16/2015  . Abnormal finding on EKG-new inferior/ septal TWI 03/28/2015  . DNR no code (do not resuscitate) 03/28/2015  . DVT, femoral, acute ([place]) 03/28/2015  . Chronic pulmonary embolism ([place]) 03/28/2015  . Dyslipidemia 03/12/2015  . Seizure disorder ([place]) 03/11/2015  . Depression 09/26/2014  . Dementia with behavioral disturbance 09/19/2012  . Hypothyroidism 12/08/2006    Past Surgical History:  Procedure Laterality Date  . bletheroplasty    . CATARACT EXTRACTION W/ INTRAOCULAR LENS  IMPLANT, BILATERAL  2010  . CERVICAL LAMINECTOMY  2005  . CYSTOSCOPY WITH RETROGRADE PYELOGRAM, URETEROSCOPY AND STENT PLACEMENT Bilateral 11/16/2016   Procedure: CYSTOSCOPY WITH RETROGRADE PYELOGRAMS/CLOT EVACUATION;  Surgeon: Irine Seal, MD;  Location: [facility] ORS;  Service: Urology;  Laterality: Bilateral;  . HAMMER TOE SURGERY  2013   right 2nd toe Dr. Mallie Mussel  . IVC FILTER PLACEMENT ([facility] HX)    . JOINT REPLACEMENT Bilateral 2002 Right, 1992 Left   knees  . TRANSURETHRAL RESECTION OF BLADDER TUMOR N/A 11/16/2016   Procedure: TRANSURETHRAL RESECTION OF BLADDER TUMOR (TURBT);  Surgeon: Irine Seal, MD;  Location: [facility];  Service: Urology;  Laterality: N/A;    OB History    No data available       Home Medications    Prior to Admission medications   Medication Sig Start  Date End Date Taking? Authorizing Provider  acetaminophen (TYLENOL) 325 MG tablet Take 650 mg by mouth 2 (two) times daily.    [provider]  acetaminophen (TYLENOL) 325 MG tablet Take 650 mg by mouth 2 (two) times daily as needed for mild pain or fever.    [provider]  alum & mag hydroxide-simeth ([place]) 200-200-20 MG/5ML suspension Take 30 mLs by mouth every 4 (four) hours as needed for indigestion or heartburn.     [provider]  Calcium Carbonate-Vitamin D3 (CALCIUM 600-D) 600-400 MG-UNIT TABS Take 1 tablet by mouth daily.    [provider]  Cholecalciferol (VITAMIN D3) 5000 units TABS Take 1 tablet by mouth daily.     [provider]  clonazePAM (KLONOPIN) 0.5 MG tablet Take 1 tablet (0.5 mg total) by mouth daily. and as needed at bedtime for agitation or anxiety 04/12/17   Patrecia Pour, MD  donepezil (ARICEPT) 10 MG tablet Take 10 mg by mouth at bedtime.     [provider]  DULoxetine (CYMBALTA) 30 MG capsule Take 30 mg by mouth daily.     [provider]  guaiFENesin (MUCINEX) 600 MG 12 hr tablet Take 1 tablet (600 mg total) by mouth 2 (two) times daily. 04/14/17   Gery Pray, MD  ipratropium-albuterol (DUONEB) 0.5-2.5 (3) MG/3ML SOLN Take 3 mLs by nebulization every 6 (six) hours as needed.    [provider]  lactose free nutrition (BOOST) LIQD Take 237 mLs by mouth 3 (three) times daily with meals.     [provider]  levothyroxine (SYNTHROID, LEVOTHROID) 100 MCG tablet Take 1 tablet (100 mcg total) by mouth daily before breakfast. For thyroid 09/28/14   Estill Dooms, MD  meloxicam (MOBIC) 7.5 MG tablet Take 7.5 mg by mouth daily as needed for pain.    [provider]  memantine (NAMENDA) 10 MG tablet Take 10 mg by mouth 2 (two) times daily.    [provider]  Multiple Vitamins-Minerals (THERA M PLUS PO) Take 1 tablet by mouth daily.     [provider]  omega-3 acid ethyl esters (LOVAZA) 1 g capsule Take 1 g by mouth daily.    [provider]  omeprazole (PRILOSEC) 20 MG capsule Take 20 mg by mouth at bedtime.    [provider]  ondansetron (ZOFRAN) 4 MG tablet Take 4 mg by mouth every 8 (eight) hours as needed for nausea or vomiting.    [provider]  OXYGEN Inhale 2 L/min into the lungs as needed. To keep O2 Sat >90%    [provider]  phenytoin (DILANTIN) 100  MG ER capsule Take 200 mg by mouth at bedtime.     [provider]  phenytoin (DILANTIN) 100 MG ER capsule Take 100 mg by mouth daily.    [provider]  QUEtiapine (SEROQUEL) 25 MG tablet Take 12.5 mg by mouth at bedtime. At 8 pm    [provider]  umeclidinium-vilanterol (ANORO ELLIPTA) 62.5-25 MCG/INH AEPB Inhale 1 puff into the lungs daily. 02/05/17   Javier Glazier, MD    Family History Family History  Problem Relation Age of Onset  . Alzheimer's disease Sister   . Emphysema Sister   . Emphysema Father   . Rheumatologic disease Neg Hx     Social History Social History   Tobacco Use  . Smoking status: Former Smoker    Packs/day: 0.50    Years: 50.00    Pack  years: 25.00    Last attempt to quit: 09/14/1988    Years since quitting: 28.6  . Smokeless tobacco: Never Used  . Tobacco comment: Smoked <1ppd  Substance Use Topics  . Alcohol use: No    Alcohol/week: 0.6 oz    Types: 1 Glasses of wine per week    Frequency: Never  . Drug use: No     Allergies   Macrodantin [nitrofurantoin]; Morphine and related; and Sulfa antibiotics   Review of Systems Review of Systems  Constitutional: Negative for fever.  HENT: Negative for nosebleeds.   Eyes: Negative for redness.  Respiratory: Negative for shortness of breath.   Cardiovascular: Negative for chest pain.  Gastrointestinal: Negative for abdominal pain and vomiting.  Genitourinary: Negative for flank pain.  Musculoskeletal: Negative for back pain and neck pain.  Skin: Positive for wound.  Neurological: Negative for headaches.  Hematological: Does not bruise/bleed easily.  Psychiatric/Behavioral: Negative for confusion.     Physical Exam Updated Vital Signs There were no vitals taken for this visit.  Physical Exam  Constitutional: She appears well-developed and well-nourished. No distress.  HENT:  1.5 cm forehead lac.   Eyes: Conjunctivae are normal. Pupils are equal, round, and  reactive to light. No scleral icterus.  Neck: Normal range of motion. Neck supple. No tracheal deviation present.  Cardiovascular: Normal rate, regular rhythm, normal heart sounds and intact distal pulses.  Pulmonary/Chest: Effort normal and breath sounds normal. No respiratory distress.  Abdominal: Soft. Normal appearance and bowel sounds are normal. She exhibits no distension. There is no tenderness.  Musculoskeletal: She exhibits no edema.  CTLS spine, non tender, aligned, no step off. Skin tear palm of right hand. Good rom bil extremities, no there focal bony tenderness.   Neurological: She is alert.  Skin: Skin is warm and dry. No rash noted. She is not diaphoretic.  Psychiatric: She has a normal mood and affect.  Nursing note and vitals reviewed.    ED Treatments / Results  Labs (all labs ordered are listed, but only abnormal results are displayed) Labs Reviewed - No data to display  EKG  EKG Interpretation None       Radiology Ct Chest W Contrast  Result Date: 05/15/2017 CLINICAL DATA:  Left upper lobe squamous cell lung cancer diagnosed 01/08/2017. Chemotherapy and radiation therapy completed. EXAM: CT CHEST WITH CONTRAST TECHNIQUE: Multidetector CT imaging of the chest was performed during intravenous contrast administration. CONTRAST:  9mL ISOVUE-300 IOPAMIDOL (ISOVUE-300) INJECTION 61% COMPARISON:  PET-CT 01/08/2017.  Chest CT 12/04/2016. FINDINGS: Cardiovascular: There is atherosclerosis of the aorta, great vessels and coronary arteries. No acute vascular findings. The heart is mildly enlarged. There is trace pericardial fluid versus thickening. Mediastinum/Nodes: There are no enlarged mediastinal, hilar or axillary lymph nodes.There are small mediastinal and hilar lymph nodes by The thyroid gland, trachea and esophagus demonstrate no significant findings. Lungs/Pleura: No pleural effusion or pneumothorax. The previously demonstrated cavitary, hypermetabolic lingular  lesion is smaller, measuring 19 x 15 mm on image 66 (previously 32 x 25 mm). Surrounding adjacent opacity likely represents sequela of radiation. There is extensive chronic underlying lung disease with centrilobular emphysema, subpleural reticulation and traction bronchiectasis. Coarse subpleural calcifications are present in both lungs. The 11 mm right upper lobe nodules seen on the PET-CT has resolved. There is a stable asymmetric ground-glass density in the right upper lobe. No new or enlarging pulmonary nodules. Upper abdomen: No suspicious findings within the visualized upper abdomen. Probable perfusion defect superiorly in the left hepatic  lobe without corresponding abnormality on previous PET-CT. Musculoskeletal/Chest wall: There is no chest wall mass or suspicious osseous finding. Advanced degenerative changes throughout the spine. IMPRESSION: 1. The treated left upper lobe squamous cell carcinoma has decreased in size. There is mild surrounding radiation change. 2. No enlarged mediastinal or hilar lymph nodes are seen. There is no residual right upper lobe nodularity as seen on PET-CT or other evidence of metastatic disease. 3. Similar extensive chronic fibrotic lung disease remaining suspicious for usual interstitial pneumonia. 4.  Aortic Atherosclerosis (ICD10-I70.0). Electronically Signed   By: Richardean Sale M.D.   On: 05/15/2017 15:15    Procedures .Marland KitchenLaceration Repair Date/Time: 05/16/2017 5:24 PM Performed by: Lajean Saver, MD Authorized by: Lajean Saver, MD   Anesthesia (see MAR for exact dosages):    Anesthesia method:  Local infiltration   Local anesthetic:  Lidocaine 2% WITH epi Laceration details:    Location:  Face   Face location:  Forehead   Length (cm):  1.5 Repair type:    Repair type:  Simple Pre-procedure details:    Preparation:  Patient was prepped and draped in usual sterile fashion Exploration:    Wound exploration: entire depth of wound probed and visualized     Treatment:    Area cleansed with:  Betadine   Amount of cleaning:  Standard   Irrigation solution:  Sterile saline Skin repair:    Repair method:  Sutures   Suture size:  5-0   Suture material:  Prolene   Suture technique:  Simple interrupted Approximation:    Vermilion border: well-aligned   Post-procedure details:    Patient tolerance of procedure:  Tolerated well, no immediate complications  .Marland KitchenLaceration Repair Date/Time: 05/16/2017 5:26 PM Performed by: Lajean Saver, MD Authorized by: Lajean Saver, MD   Anesthesia (see MAR for exact dosages):    Anesthesia method:  Local infiltration   Local anesthetic:  Lidocaine 2% WITH epi Laceration details:    Location:  Hand   Hand location:  L palm   Length (cm):  1.6 Pre-procedure details:    Preparation:  Patient was prepped and draped in usual sterile fashion Exploration:    Wound exploration: entire depth of wound probed and visualized   Treatment:    Area cleansed with:  Betadine   Amount of cleaning:  Standard   Irrigation solution:  Sterile saline Skin repair:    Repair method:  Sutures   Suture size:  4-0   Suture material:  Prolene   Number of sutures:  3 Approximation:    Vermilion border: well-aligned   Post-procedure details:    Patient tolerance of procedure:  Tolerated well, no immediate complications   (including critical care time)  Medications Ordered in ED Medications  lidocaine-EPINEPHrine (XYLOCAINE W/EPI) 2 %-1:200000 (PF) injection 10 mL (not administered)     Initial Impression / Assessment and Plan / ED Course  I have reviewed the triage vital signs and the nursing notes.  Pertinent labs & imaging results that were available during my care of the patient were reviewed by me and considered in my medical decision making (see chart for details).  Wound clean and sutured.   No focal bony tenderness on exam.   Reviewed nursing notes and prior charts for additional history.   Recheck  spine non tender. No headache. Mental status remains at baseline. No nv.  Pt currently appears stable for d/c.     Final Clinical Impressions(s) / ED Diagnoses   Final diagnoses:  None  ED Discharge Orders    None       Lajean Saver, MD 05/16/17 1728

## 2017-05-21 ENCOUNTER — Inpatient Hospital Stay: Payer: Medicare Other | Attending: Oncology | Admitting: Oncology

## 2017-05-21 ENCOUNTER — Telehealth: Payer: Self-pay | Admitting: Internal Medicine

## 2017-05-21 VITALS — BP 106/59 | HR 86 | Temp 97.4°F | Resp 17 | Ht 67.0 in | Wt 134.0 lb

## 2017-05-21 DIAGNOSIS — R0602 Shortness of breath: Secondary | ICD-10-CM | POA: Diagnosis not present

## 2017-05-21 DIAGNOSIS — R05 Cough: Secondary | ICD-10-CM

## 2017-05-21 DIAGNOSIS — Z9981 Dependence on supplemental oxygen: Secondary | ICD-10-CM | POA: Diagnosis not present

## 2017-05-21 DIAGNOSIS — Z9181 History of falling: Secondary | ICD-10-CM

## 2017-05-21 DIAGNOSIS — R5383 Other fatigue: Secondary | ICD-10-CM

## 2017-05-21 DIAGNOSIS — C3492 Malignant neoplasm of unspecified part of left bronchus or lung: Secondary | ICD-10-CM

## 2017-05-21 NOTE — Telephone Encounter (Signed)
Gave avs and calendar for april °

## 2017-05-22 ENCOUNTER — Encounter: Payer: Self-pay | Admitting: Oncology

## 2017-05-22 NOTE — Assessment & Plan Note (Signed)
this is a very pleasant 84 year old white female with unresectable stage IIA non-small cell lung cancer, squamous cell carcinoma. She completed a course of concurrent chemoradiation with weekly carboplatin and paclitaxel status post 5 cycles. The patient is feeling much better today and has recovered from her dehydration and questionable pneumonia.  The patient was seen with Dr. Julien Nordmann.  CT scan results were discussed with the patient and her son.  Explained that the CT shows improvement in her lung cancer.  We discussed close monitoring of the patient with CT scans every 3 months versus the use of Imfinzi every 2 weeks for up to 1 year. Adverse effects of this treatment were discussed including but not limited to immune mediated the skin rash, diarrhea, inflammation of the lung, kidney, liver, thyroid or other endocrine dysfunction.  Given the patient's comorbid issues, recommend the patient proceed with observation only.  The patient's son is in agreement with this plan.  Patient will have a repeat CT scan of the chest in 3 months.  She will have a follow-up visit several days later to discuss the results.   The patient and her son were advised to call immediately if she has any concerning symptoms in the interval. The patient voices understanding of current disease status and treatment options and is in agreement with the current care plan.  All questions were answered. The patient knows to call the clinic with any problems, questions or concerns. We can certainly see the patient much sooner if necessary.

## 2017-05-22 NOTE — Progress Notes (Signed)
[facility] OFFICE PROGRESS NOTE  [name], MD [place] [address]  DIAGNOSIS: Stage IIA (T2a, N1, M0) non-small cell lung cancer, squamous cell carcinoma presented with lingual mass as well as left hilar adenopathy diagnosed in September 2018.  PRIOR THERAPY: A course of concurrent chemoradiation with weekly carboplatin for AUC of 2 and paclitaxel 45 MG/M2. Status post 5 cycle.  Last dose of chemotherapy was given April 06, 2017 and last radiation fraction was April 22, 2017.  CURRENT THERAPY: Observation  INTERVAL HISTORY: [name] 84 y.o. female returns for routine follow-up visit accompanied by her son.  The patient continues to reside at a skilled nursing facility.  She completed 5 cycles of her chemotherapy which was then stopped secondary to questionable pneumonia and decreased oral intake and dehydration.  The patient's son feels as though she is feeling better today.  She had a recent fall at the skilled nursing facility requiring sutures to her left forehead.  She still has some confusion related to her dementia.  She has ongoing shortness of breath and remains on oxygen.  She has not had any fevers or chills.  No chest pain or hemoptysis.  She has an ongoing cough which is unchanged.  Denies nausea, vomiting, constipation, diarrhea.  The patient had a recent CT scan is here to discuss the results.  MEDICAL HISTORY: Past Medical History:  Diagnosis Date  . Anemia, unspecified 03/18/2011  . Anxiety state, unspecified 01/2011  . Blood in stool 06/15/2012  . Corns and callosities 07/01/2011  . Dementia   . Diverticulosis 02/2011  . Dizziness   . DJD (degenerative joint disease)   . Dysuria 06/17/2011  . Hemorrhoids 02/2011  . History of radiation therapy 03/10/17-04/22/17   left lung 60 Gy in 30 fractions  . Low back pain   . Lung cancer ([place])    left  . Mitral valve problem thickened   thickened  . Osteoarthritis of both hands  10/09/2015  . Osteoporosis 02/2011  . Other abnormal blood chemistry 0/30/2012  . Other acquired deformity of toe 12/16/2011  . Pain in joint, site unspecified 01/2012  . 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 Sacroiliitis, not elsewhere classified ([place]) 05/2011  . Seizures ([place])    Remotely  . Unspecified hypothyroidism 01/2011  . 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 Vitamin D deficiency 01/2011    ALLERGIES:  is allergic to macrodantin [nitrofurantoin]; morphine and related; and sulfa antibiotics.  MEDICATIONS:  Current Outpatient Medications  Medication Sig Dispense Refill  . meloxicam (MOBIC) 7.5 MG tablet Take 7.5 mg by mouth daily.    Marland Kitchen acetaminophen (TYLENOL) 325 MG tablet Take 650 mg by mouth 2 (two) times daily.    Marland Kitchen alum & mag hydroxide-simeth (MINTOX) 638-756-43 MG/5ML suspension Take 30 mLs by mouth every 4 (four) hours as needed for indigestion or heartburn.    . Calcium Carbonate-Vitamin D3 (CALCIUM 600-D) 600-400 MG-UNIT TABS Take 1 tablet by mouth daily.    . Cholecalciferol (VITAMIN D3) 5000 units TABS Take 1 tablet by mouth daily.     . clonazePAM (KLONOPIN) 0.5 MG tablet Take 1 tablet (0.5 mg total) by mouth daily. and as needed at bedtime for agitation or anxiety 6 tablet 0  . donepezil (ARICEPT) 10 MG tablet Take 10 mg by mouth at bedtime.     . DULoxetine (CYMBALTA) 30 MG capsule Take 30 mg by mouth daily.     Marland Kitchen  guaiFENesin (MUCINEX) 600 MG 12 hr tablet Take 1 tablet (600 mg total) by mouth 2 (two) times daily. 30 tablet 0  . ipratropium-albuterol (DUONEB) 0.5-2.5 (3) MG/3ML SOLN Take 3 mLs by nebulization every 6 (six) hours as needed.    . lactose free nutrition (BOOST) LIQD Take 237 mLs by mouth 3 (three) times daily with meals.     Marland Kitchen levothyroxine (SYNTHROID, LEVOTHROID) 100 MCG tablet Take 1 tablet (100 mcg total) by mouth daily before breakfast. For thyroid 90 tablet 3  . memantine (NAMENDA) 10 MG tablet  Take 10 mg by mouth 2 (two) times daily.    . Multiple Vitamins-Minerals (THERA M PLUS PO) Take 1 tablet by mouth daily.     Marland Kitchen omega-3 acid ethyl esters (LOVAZA) 1 g capsule Take 1 g by mouth daily.    Marland Kitchen omeprazole (PRILOSEC) 20 MG capsule Take 20 mg by mouth at bedtime.    . ondansetron (ZOFRAN) 4 MG tablet Take 4 mg by mouth every 8 (eight) hours as needed for nausea or vomiting.    . OXYGEN Inhale 2 L/min into the lungs as needed. To keep O2 Sat >90%    . phenytoin (DILANTIN) 100 MG ER capsule Take 200 mg by mouth at bedtime.     . phenytoin (DILANTIN) 200 MG ER capsule Take 200 mg by mouth at bedtime.    Marland Kitchen QUEtiapine (SEROQUEL) 25 MG tablet Take 12.5 mg by mouth at bedtime. At 8 pm    . umeclidinium-vilanterol (ANORO ELLIPTA) 62.5-25 MCG/INH AEPB Inhale 1 puff into the lungs daily. 1 each 6   No current facility-administered medications for this visit.     SURGICAL HISTORY:  Past Surgical History:  Procedure Laterality Date  . bletheroplasty    . CATARACT EXTRACTION W/ INTRAOCULAR LENS  IMPLANT, BILATERAL  2010  . CERVICAL LAMINECTOMY  2005  . CYSTOSCOPY WITH RETROGRADE PYELOGRAM, URETEROSCOPY AND STENT PLACEMENT Bilateral 11/16/2016   Procedure: CYSTOSCOPY WITH RETROGRADE PYELOGRAMS/CLOT EVACUATION;  Surgeon: Irine Seal, MD;  Location: [facility];  Service: Urology;  Laterality: Bilateral;  . HAMMER TOE SURGERY  2013   right 2nd toe Dr. Mallie Mussel  . IVC FILTER PLACEMENT ([facility] HX)    . JOINT REPLACEMENT Bilateral 2002 Right, 1992 Left   knees  . TRANSURETHRAL RESECTION OF BLADDER TUMOR N/A 11/16/2016   Procedure: TRANSURETHRAL RESECTION OF BLADDER TUMOR (TURBT);  Surgeon: Irine Seal, MD;  Location: [facility];  Service: Urology;  Laterality: N/A;    REVIEW OF SYSTEMS:   Review of Systems  Constitutional: Negative for appetite change, chills, fatigue, fever and unexpected weight change.  HENT:   Negative for mouth sores, nosebleeds, sore throat and trouble swallowing.   Eyes: Negative for eye  problems and icterus.  Respiratory: Negative for hemoptysis and wheezing.  Positive for cough and her baseline shortness of breath. Cardiovascular: Negative for chest pain and leg swelling.  Gastrointestinal: Negative for abdominal pain, constipation, diarrhea, nausea and vomiting.  Genitourinary: Negative for bladder incontinence, difficulty urinating, dysuria, frequency and hematuria.   Musculoskeletal: Negative for back pain, gait problem, neck pain and neck stiffness.  Skin: Negative for itching and rash.  Neurological: Negative for dizziness, extremity weakness, gait problem, headaches, light-headedness and seizures.  Hematological: Negative for adenopathy. Does not bruise/bleed easily.  Psychiatric/Behavioral: Negative for depression and sleep disturbance. The patient is not nervous/anxious.  Positive for confusion.   PHYSICAL EXAMINATION:  Blood pressure (!) 106/59, pulse 86, temperature (!) 97.4 F (36.3 C), temperature source Oral, resp. rate 17,  height 5\' 7"  (1.702 m), weight 134 lb (60.8 kg), SpO2 96 %.  ECOG PERFORMANCE STATUS: 1 - Symptomatic but completely ambulatory  Physical Exam  Constitutional: Oriented to person. No distress.  HENT:  Head: Normocephalic and atraumatic.  Mouth/Throat: Oropharynx is clear and moist. No oropharyngeal exudate.  Eyes: Conjunctivae are normal. Right eye exhibits no discharge. Left eye exhibits no discharge. No scleral icterus.  Neck: Normal range of motion. Neck supple.  Cardiovascular: Normal rate, regular rhythm, normal heart sounds and intact distal pulses.   Pulmonary/Chest: Effort normal and breath sounds normal. No respiratory distress. No wheezes. No rales.  Abdominal: Soft. Bowel sounds are normal. Exhibits no distension and no mass. There is no tenderness.  Musculoskeletal: Normal range of motion. Exhibits no edema.  Lymphadenopathy:    No cervical adenopathy.  Neurological: Alert and oriented to person. Exhibits normal muscle  tone. Gait normal. Coordination normal.  Skin: Skin is warm and dry. No rash noted. Not diaphoretic. No erythema. No pallor.  Psychiatric: Mood and judgment normal.  Vitals reviewed.  LABORATORY DATA: Lab Results  Component Value Date   WBC 9.1 05/15/2017   HGB 11.8 05/15/2017   HCT 37.0 05/15/2017   MCV 105.1 (H) 05/15/2017   PLT 342 05/15/2017      Chemistry      Component Value Date/Time   NA 140 05/15/2017 1136   K 4.7 05/15/2017 1136   CL 110 04/08/2017 2336   CO2 27 05/15/2017 1136   BUN 24.3 05/15/2017 1136   CREATININE 0.9 05/15/2017 1136   GLU 97 04/20/2017      Component Value Date/Time   CALCIUM 9.5 05/15/2017 1136   ALKPHOS 75 05/15/2017 1136   AST 16 05/15/2017 1136   ALT 12 05/15/2017 1136   BILITOT 0.22 05/15/2017 1136       RADIOGRAPHIC STUDIES:  Ct Chest W Contrast  Result Date: 05/15/2017 CLINICAL DATA:  Left upper lobe squamous cell lung cancer diagnosed 01/08/2017. Chemotherapy and radiation therapy completed. EXAM: CT CHEST WITH CONTRAST TECHNIQUE: Multidetector CT imaging of the chest was performed during intravenous contrast administration. CONTRAST:  70mL ISOVUE-300 IOPAMIDOL (ISOVUE-300) INJECTION 61% COMPARISON:  PET-CT 01/08/2017.  Chest CT 12/04/2016. FINDINGS: Cardiovascular: There is atherosclerosis of the aorta, great vessels and coronary arteries. No acute vascular findings. The heart is mildly enlarged. There is trace pericardial fluid versus thickening. Mediastinum/Nodes: There are no enlarged mediastinal, hilar or axillary lymph nodes.There are small mediastinal and hilar lymph nodes by The thyroid gland, trachea and esophagus demonstrate no significant findings. Lungs/Pleura: No pleural effusion or pneumothorax. The previously demonstrated cavitary, hypermetabolic lingular lesion is smaller, measuring 19 x 15 mm on image 66 (previously 32 x 25 mm). Surrounding adjacent opacity likely represents sequela of radiation. There is extensive  chronic underlying lung disease with centrilobular emphysema, subpleural reticulation and traction bronchiectasis. Coarse subpleural calcifications are present in both lungs. The 11 mm right upper lobe nodules seen on the PET-CT has resolved. There is a stable asymmetric ground-glass density in the right upper lobe. No new or enlarging pulmonary nodules. Upper abdomen: No suspicious findings within the visualized upper abdomen. Probable perfusion defect superiorly in the left hepatic lobe without corresponding abnormality on previous PET-CT. Musculoskeletal/Chest wall: There is no chest wall mass or suspicious osseous finding. Advanced degenerative changes throughout the spine. IMPRESSION: 1. The treated left upper lobe squamous cell carcinoma has decreased in size. There is mild surrounding radiation change. 2. No enlarged mediastinal or hilar lymph nodes are seen. There is  no residual right upper lobe nodularity as seen on PET-CT or other evidence of metastatic disease. 3. Similar extensive chronic fibrotic lung disease remaining suspicious for usual interstitial pneumonia. 4.  Aortic Atherosclerosis (ICD10-I70.0). Electronically Signed   By: Richardean Sale M.D.   On: 05/15/2017 15:15     ASSESSMENT/PLAN:  Stage II squamous cell carcinoma of left lung [facility]) this is a very pleasant 84 year old white female with unresectable stage IIA non-small cell lung cancer, squamous cell carcinoma. She completed a course of concurrent chemoradiation with weekly carboplatin and paclitaxel status post 5 cycles. The patient is feeling much better today and has recovered from her dehydration and questionable pneumonia.  The patient was seen with Dr. Julien Nordmann.  CT scan results were discussed with the patient and her son.  Explained that the CT shows improvement in her lung cancer.  We discussed close monitoring of the patient with CT scans every 3 months versus the use of Imfinzi every 2 weeks for up to 1 year. Adverse  effects of this treatment were discussed including but not limited to immune mediated the skin rash, diarrhea, inflammation of the lung, kidney, liver, thyroid or other endocrine dysfunction.  Given the patient's comorbid issues, recommend the patient proceed with observation only.  The patient's son is in agreement with this plan.  Patient will have a repeat CT scan of the chest in 3 months.  She will have a follow-up visit several days later to discuss the results.   The patient and her son were advised to call immediately if she has any concerning symptoms in the interval. The patient voices understanding of current disease status and treatment options and is in agreement with the current care plan.  All questions were answered. The patient knows to call the clinic with any problems, questions or concerns. We can certainly see the patient much sooner if necessary.  Orders Placed This Encounter  Procedures  . CT CHEST W CONTRAST    Standing Status:   Future    Standing Expiration Date:   05/21/2018    Order Specific Question:   If indicated for the ordered procedure, I authorize the administration of contrast media per Radiology protocol    Answer:   Yes    Order Specific Question:   Preferred imaging location?    Answer:   [facility]    Order Specific Question:   Radiology Contrast Protocol - do NOT remove file path    Answer:   [email or web address]    Order Specific Question:   Reason for Exam additional comments    Answer:   lung cancer. restaging.  Marland Kitchen CBC with Differential/Platelet    Standing Status:   Future    Standing Expiration Date:   05/21/2018  . Comprehensive metabolic panel    Standing Status:   Future    Standing Expiration Date:   05/21/2018    Mikey Bussing, DNP, AGPCNP-BC, AOCNP 05/22/17  ADDENDUM: Hematology/Oncology Attending: I had a face-to-face encounter with the patient.  I recommended her care plan.  This is a very  pleasant 84 years old white female who came to the clinic today for follow-up visit accompanied by her son.  The patient has a history of unresectable stage IIA non-small cell lung cancer, squamous cell carcinoma status post a course of concurrent chemoradiation with weekly carboplatin and paclitaxel.  She tolerated her treatment well except for fatigue. The patient had repeat CT scan of the chest performed recently.  I personally and  independently reviewed the scan images and discussed the results and showed the images to the patient and her son. Her scan showed improvement of her disease.  There was no concerning findings for disease progression. I had a lengthy discussion with the patient and her son about her condition and treatment options. I do not think the patient will be a good candidate for consolidation immunotherapy taken into consideration her stage of the disease as well as her age and comorbidities. I recommended for her to continue on observation with close monitoring. We will arrange for the patient to come back for follow-up visit in 3 months with repeat CT scan of the chest for restaging of her disease. The patient was advised to call immediately if she has any concerning symptoms in the interval.  Disclaimer: This note was dictated with voice recognition software. Similar sounding words can inadvertently be transcribed and may be missed upon review. Eilleen Kempf, MD 05/22/17

## 2017-05-25 ENCOUNTER — Non-Acute Institutional Stay (SKILLED_NURSING_FACILITY): Payer: Medicare Other | Admitting: Family

## 2017-05-25 ENCOUNTER — Other Ambulatory Visit: Payer: Self-pay

## 2017-05-25 ENCOUNTER — Encounter: Payer: Self-pay | Admitting: Radiation Oncology

## 2017-05-25 ENCOUNTER — Encounter: Payer: Self-pay | Admitting: Family

## 2017-05-25 ENCOUNTER — Ambulatory Visit
Admission: RE | Admit: 2017-05-25 | Discharge: 2017-05-25 | Disposition: A | Discharge status: Home / Self Care | Payer: Medicare Other | Source: Ambulatory Visit | Attending: Radiation Oncology | Admitting: Radiation Oncology

## 2017-05-25 DIAGNOSIS — R634 Abnormal weight loss: Secondary | ICD-10-CM

## 2017-05-25 DIAGNOSIS — S0181XD Laceration without foreign body of other part of head, subsequent encounter: Secondary | ICD-10-CM

## 2017-05-25 DIAGNOSIS — Z79899 Other long term (current) drug therapy: Secondary | ICD-10-CM | POA: Diagnosis not present

## 2017-05-25 DIAGNOSIS — R2681 Unsteadiness on feet: Secondary | ICD-10-CM

## 2017-05-25 DIAGNOSIS — Z881 Allergy status to other antibiotic agents status: Secondary | ICD-10-CM | POA: Insufficient documentation

## 2017-05-25 DIAGNOSIS — Z885 Allergy status to narcotic agent status: Secondary | ICD-10-CM | POA: Diagnosis not present

## 2017-05-25 DIAGNOSIS — Z923 Personal history of irradiation: Secondary | ICD-10-CM | POA: Insufficient documentation

## 2017-05-25 DIAGNOSIS — C3492 Malignant neoplasm of unspecified part of left bronchus or lung: Secondary | ICD-10-CM

## 2017-05-25 DIAGNOSIS — C3412 Malignant neoplasm of upper lobe, left bronchus or lung: Secondary | ICD-10-CM | POA: Insufficient documentation

## 2017-05-25 NOTE — Progress Notes (Addendum)
Rebecca [name] is here for follow up with her son.  She denies having any pain or shortness of breath.  She is using 2 L of oxygen.  She has an occasional cough and her son said she sounds like there is a rattle in her chest.  He also said she has a poor energy level.  The skin on her left chest and back is intact.  BP (!) 92/56 (BP Location: Right Arm, Patient Position: Sitting)   Pulse 84   Temp 97.6 F (36.4 C) (Oral)   Ht 5\' 7"  (1.702 m)   Wt 132 lb (59.9 kg)   SpO2 93% Comment: 2L  BMI 20.67 kg/m    Wt Readings from Last 3 Encounters:  05/25/17 131 lb 12.8 oz (59.8 kg)  05/25/17 132 lb (59.9 kg)  05/21/17 134 lb (60.8 kg)

## 2017-05-25 NOTE — Progress Notes (Signed)
Radiation Oncology         [phone] ________________________________  Name: Rebecca [name] MRN: [id number]  Date: 05/25/2017  DOB: 1927-09-06  Follow-Up Visit Note  CC: [name], MD  [name], MD    ICD-10-CM   1. Stage II squamous cell carcinoma of left lung ([place]) C34.92     Diagnosis:   84 y.o. women with squamous cell carcinoma of left lung ([place]).  Interval Since Last Radiation:  1 months  03/10/17 - 04/22/17 Left lung // 60 Gy in 30 fx.  Narrative:  The patient returns today for routine follow-up.  She denies having any pain or shortness of breath.  She is using 2 L of oxygen.  She has an occasional cough and her son said she sounds like there is a rattle in her chest.  He also said she has a poor energy level.  The skin on her left chest and back is intact.  ALLERGIES:  is allergic to macrodantin [nitrofurantoin]; morphine and related; and sulfa antibiotics.  Meds: Current Outpatient Medications  Medication Sig Dispense Refill  . acetaminophen (TYLENOL) 325 MG tablet Take 650 mg by mouth 2 (two) times daily.    Marland Kitchen alum & mag hydroxide-simeth (MINTOX) 947-654-65 MG/5ML suspension Take 30 mLs by mouth every 4 (four) hours as needed for indigestion or heartburn.    . Calcium Carbonate-Vitamin D3 (CALCIUM 600-D) 600-400 MG-UNIT TABS Take 1 tablet by mouth daily.    . Cholecalciferol (VITAMIN D3) 5000 units TABS Take 1 tablet by mouth daily.     . clonazePAM (KLONOPIN) 0.5 MG tablet Take 1 tablet (0.5 mg total) by mouth daily. and as needed at bedtime for agitation or anxiety 6 tablet 0  . donepezil (ARICEPT) 10 MG tablet Take 10 mg by mouth at bedtime.     . DULoxetine (CYMBALTA) 30 MG capsule Take 30 mg by mouth daily.     Marland Kitchen guaiFENesin (MUCINEX) 600 MG 12 hr tablet Take 1 tablet (600 mg total) by mouth 2 (two) times daily. 30 tablet 0  . ipratropium-albuterol (DUONEB) 0.5-2.5 (3) MG/3ML SOLN Take 3 mLs by nebulization every 6 (six) hours as needed.    . lactose  free nutrition (BOOST) LIQD Take 237 mLs by mouth 3 (three) times daily with meals.     Marland Kitchen levothyroxine (SYNTHROID, LEVOTHROID) 100 MCG tablet Take 1 tablet (100 mcg total) by mouth daily before breakfast. For thyroid 90 tablet 3  . meloxicam (MOBIC) 7.5 MG tablet Take 7.5 mg by mouth daily.    . memantine (NAMENDA) 10 MG tablet Take 10 mg by mouth 2 (two) times daily.    . Multiple Vitamins-Minerals (THERA M PLUS PO) Take 1 tablet by mouth daily.     Marland Kitchen omega-3 acid ethyl esters (LOVAZA) 1 g capsule Take 1 g by mouth daily.    Marland Kitchen omeprazole (PRILOSEC) 20 MG capsule Take 20 mg by mouth at bedtime.    . ondansetron (ZOFRAN) 4 MG tablet Take 4 mg by mouth every 8 (eight) hours as needed for nausea or vomiting.    . OXYGEN Inhale 2 L/min into the lungs as needed. To keep O2 Sat >90%    . phenytoin (DILANTIN) 100 MG ER capsule Take 200 mg by mouth at bedtime.     . phenytoin (DILANTIN) 200 MG ER capsule Take 200 mg by mouth at bedtime.    Marland Kitchen QUEtiapine (SEROQUEL) 25 MG tablet Take 12.5 mg by mouth at bedtime. At 8 pm    .  umeclidinium-vilanterol (ANORO ELLIPTA) 62.5-25 MCG/INH AEPB Inhale 1 puff into the lungs daily. 1 each 6   No current facility-administered medications for this encounter.     Physical Findings: The patient is in no acute distress. Patient is alert and oriented.  height is 5\' 7"  (1.702 m) and weight is 132 lb (59.9 kg). Her oral temperature is 97.6 F (36.4 C). Her blood pressure is 92/56 (abnormal) and her pulse is 84. Her oxygen saturation is 93%. .  No significant changes. Lungs are clear to auscultation bilaterally. Heart has regular rate and rhythm. No palpable cervical, supraclavicular, or axillary adenopathy. Abdomen soft, non-tender, normal bowel sounds.   Lab Findings: Lab Results  Component Value Date   WBC 9.1 05/15/2017   HGB 11.8 05/15/2017   HCT 37.0 05/15/2017   MCV 105.1 (H) 05/15/2017   PLT 342 05/15/2017    Radiographic Findings: Ct Chest W  Contrast  Result Date: 05/15/2017 CLINICAL DATA:  Left upper lobe squamous cell lung cancer diagnosed 01/08/2017. Chemotherapy and radiation therapy completed. EXAM: CT CHEST WITH CONTRAST TECHNIQUE: Multidetector CT imaging of the chest was performed during intravenous contrast administration. CONTRAST:  64mL ISOVUE-300 IOPAMIDOL (ISOVUE-300) INJECTION 61% COMPARISON:  PET-CT 01/08/2017.  Chest CT 12/04/2016. FINDINGS: Cardiovascular: There is atherosclerosis of the aorta, great vessels and coronary arteries. No acute vascular findings. The heart is mildly enlarged. There is trace pericardial fluid versus thickening. Mediastinum/Nodes: There are no enlarged mediastinal, hilar or axillary lymph nodes.There are small mediastinal and hilar lymph nodes by The thyroid gland, trachea and esophagus demonstrate no significant findings. Lungs/Pleura: No pleural effusion or pneumothorax. The previously demonstrated cavitary, hypermetabolic lingular lesion is smaller, measuring 19 x 15 mm on image 66 (previously 32 x 25 mm). Surrounding adjacent opacity likely represents sequela of radiation. There is extensive chronic underlying lung disease with centrilobular emphysema, subpleural reticulation and traction bronchiectasis. Coarse subpleural calcifications are present in both lungs. The 11 mm right upper lobe nodules seen on the PET-CT has resolved. There is a stable asymmetric ground-glass density in the right upper lobe. No new or enlarging pulmonary nodules. Upper abdomen: No suspicious findings within the visualized upper abdomen. Probable perfusion defect superiorly in the left hepatic lobe without corresponding abnormality on previous PET-CT. Musculoskeletal/Chest wall: There is no chest wall mass or suspicious osseous finding. Advanced degenerative changes throughout the spine. IMPRESSION: 1. The treated left upper lobe squamous cell carcinoma has decreased in size. There is mild surrounding radiation change. 2. No  enlarged mediastinal or hilar lymph nodes are seen. There is no residual right upper lobe nodularity as seen on PET-CT or other evidence of metastatic disease. 3. Similar extensive chronic fibrotic lung disease remaining suspicious for usual interstitial pneumonia. 4.  Aortic Atherosclerosis (ICD10-I70.0). Electronically Signed   By: Richardean Sale M.D.   On: 05/15/2017 15:15    Impression:  The patient is recovering from the effects of radiation. No signs of reoccurrence at this time. Recent CT scan showed nice tumor shrinkage.  Plan:  PRN follow up with Radiation oncology. She will continue close follow up with medical oncology. Given the patient's comorbidities she was not felt to be a good candidate for additional therapy  ____________________________________  Blair Promise, PhD, MD  This document serves as a record of services personally performed by Gery Pray MD. It was created on his behalf by Delton Coombes, a trained medical scribe. The creation of this record is based on the scribe's personal observations and the provider's statements to them.

## 2017-05-25 NOTE — Progress Notes (Signed)
Location:  [facility] Room Number: 25 Place of Service:  SNF (31) Provider: Dinah [name] FNP-C  [name], MD  Patient Care Team: [name], MD as PCP - General (Internal Medicine) [name], [facility] [name], MD as Consulting Physician (Orthopedic Surgery) [name], MD as Consulting Physician (Physical Medicine and Rehabilitation) Melina Schools, MD as Consulting Physician (Orthopedic Surgery) [name], MD as Consulting Physician (Otolaryngology) [name], [name], NP as Nurse Practitioner (Family Medicine)  Extended Emergency Contact Information Primary Emergency Contact: [name] Address: [address]          [name] of [place] Phone: [phone] Relation: Son Secondary Emergency Contact: [name] [place] of [place] Phone: [phone] Mobile Phone: [phone] Relation: Daughter  Code Status:  DNR Goals of care: Advanced Directive information Advanced Directives 05/25/2017  Does Patient Have a Medical Advance Directive? Yes  Type of Paramedic of [place];Living will;Out of facility DNR (pink MOST or yellow form)  Does patient want to make changes to medical advance directive? -  Copy of [place] in Chart? Yes  Pre-existing out of facility DNR order (yellow form or pink MOST form) Yellow form placed in chart (order not valid for inpatient use)     Chief Complaint  Patient presents with  . Hospitalization Follow-up    ER Follow up with suture removal    HPI:  Pt is a 84 y.o. female seen today at [facility] for an acute visit for follow up Emergency room visit post fall episode sustaining laceration to left forehead and left hand/palm.Her laceration was sutured in the ED then discharged to the facility.she is seen in her room today.she is pleasantly confused. Facility Nurse reports patient continues to attempt to walk  without any assistance.of note she is status post chemotherapy/radiation course completion for squamous cell carcinoma of left lung.she continues to follow up with Oncology last seen 05/21/2017.CT scan results showed improvement of lung cancer.Results were  discussed with patient and son. Per record review recommended close monitoring of the patient with CT scans every 3 months versus the use of Imfinzi every 2 weeks for up to 1 year. She continues to have progressive weight loss and decreased appetite. Wt 146 lbs (04/19/2017);wt 135.1 (04/22/2017);132 lbs (05/06/2017); and 131.8 lbs 05/20/2017.   Past Medical History:  Diagnosis Date  . Anemia, unspecified 03/18/2011  . Anxiety state, unspecified 01/2011  . Blood in stool 06/15/2012  . Corns and callosities 07/01/2011  . Dementia   . Diverticulosis 02/2011  . Dizziness   . DJD (degenerative joint disease)   . Dysuria 06/17/2011  . Hemorrhoids 02/2011  . History of radiation therapy 03/10/17-04/22/17   left lung 60 Gy in 30 fractions  . Low back pain   . Lung cancer ([place])    left  . Mitral valve problem thickened   thickened  . Osteoarthritis of both hands 10/09/2015  . Osteoporosis 02/2011  . Other abnormal blood chemistry 0/30/2012  . Other acquired deformity of toe 12/16/2011  . Pain in joint, site unspecified 01/2012  . 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 Sacroiliitis, not elsewhere classified ([place]) 05/2011  . Seizures ([place])    Remotely  . Unspecified hypothyroidism 01/2011  . 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 Vitamin D deficiency 01/2011   Past Surgical History:  Procedure Laterality Date  . bletheroplasty    . CATARACT EXTRACTION W/  INTRAOCULAR LENS  IMPLANT, BILATERAL  2010  . CERVICAL LAMINECTOMY  2005  . CYSTOSCOPY WITH RETROGRADE PYELOGRAM, URETEROSCOPY AND STENT PLACEMENT Bilateral 11/16/2016   Procedure: CYSTOSCOPY WITH RETROGRADE PYELOGRAMS/CLOT EVACUATION;  Surgeon:  Irine Seal, MD;  Location: [facility];  Service: Urology;  Laterality: Bilateral;  . HAMMER TOE SURGERY  2013   right 2nd toe Dr. Mallie Mussel  . IVC FILTER PLACEMENT ([facility] HX)    . JOINT REPLACEMENT Bilateral 2002 Right, 1992 Left   knees  . TRANSURETHRAL RESECTION OF BLADDER TUMOR N/A 11/16/2016   Procedure: TRANSURETHRAL RESECTION OF BLADDER TUMOR (TURBT);  Surgeon: Irine Seal, MD;  Location: [facility];  Service: Urology;  Laterality: N/A;    Allergies  Allergen Reactions  . Macrodantin [Nitrofurantoin]   . Morphine And Related Other (See Comments)    Altered mental state   . Sulfa Antibiotics Other (See Comments)    Reaction: unknown    Outpatient Encounter Medications as of 05/25/2017  Medication Sig  . acetaminophen (TYLENOL) 325 MG tablet Take 650 mg by mouth 2 (two) times daily.  Marland Kitchen alum & mag hydroxide-simeth (MINTOX) 160-109-32 MG/5ML suspension Take 30 mLs by mouth every 4 (four) hours as needed for indigestion or heartburn.  . Calcium Carbonate-Vitamin D3 (CALCIUM 600-D) 600-400 MG-UNIT TABS Take 1 tablet by mouth daily.  . Cholecalciferol (VITAMIN D3) 5000 units TABS Take 1 tablet by mouth daily.   . clonazePAM (KLONOPIN) 0.5 MG tablet Take 1 tablet (0.5 mg total) by mouth daily. and as needed at bedtime for agitation or anxiety  . donepezil (ARICEPT) 10 MG tablet Take 10 mg by mouth at bedtime.   . DULoxetine (CYMBALTA) 30 MG capsule Take 30 mg by mouth daily.   Marland Kitchen guaiFENesin (MUCINEX) 600 MG 12 hr tablet Take 1 tablet (600 mg total) by mouth 2 (two) times daily.  Marland Kitchen ipratropium-albuterol (DUONEB) 0.5-2.5 (3) MG/3ML SOLN Take 3 mLs by nebulization every 6 (six) hours as needed.  . lactose free nutrition (BOOST) LIQD Take 237 mLs by mouth 3 (three) times daily with meals.   Marland Kitchen levothyroxine (SYNTHROID, LEVOTHROID) 100 MCG tablet Take 1 tablet (100 mcg total) by mouth daily before breakfast. For thyroid  . meloxicam (MOBIC) 7.5 MG tablet Take 7.5 mg by mouth daily.  . memantine (NAMENDA) 10  MG tablet Take 10 mg by mouth 2 (two) times daily.  . Multiple Vitamins-Minerals (THERA M PLUS PO) Take 1 tablet by mouth daily.   Marland Kitchen omega-3 acid ethyl esters (LOVAZA) 1 g capsule Take 1 g by mouth daily.  Marland Kitchen omeprazole (PRILOSEC) 20 MG capsule Take 20 mg by mouth at bedtime.  . ondansetron (ZOFRAN) 4 MG tablet Take 4 mg by mouth every 8 (eight) hours as needed for nausea or vomiting.  . OXYGEN Inhale 2 L/min into the lungs as needed. To keep O2 Sat >90%  . phenytoin (DILANTIN) 100 MG ER capsule Take 200 mg by mouth at bedtime.   . phenytoin (DILANTIN) 200 MG ER capsule Take 200 mg by mouth at bedtime.  Marland Kitchen QUEtiapine (SEROQUEL) 25 MG tablet Take 12.5 mg by mouth at bedtime. At 8 pm  . umeclidinium-vilanterol (ANORO ELLIPTA) 62.5-25 MCG/INH AEPB Inhale 1 puff into the lungs daily.   No facility-administered encounter medications on file as of 05/25/2017.     Review of Systems  Constitutional: Negative for appetite change, chills, fatigue and fever.  HENT: Negative for congestion, rhinorrhea, sinus pressure, sinus pain, sneezing and sore throat.        Left  forehead laceration site   Eyes: Negative for redness and itching.  Respiratory: Negative for chest tightness and wheezing.        Chronic cough and shortness of breath   Cardiovascular: Negative for chest pain, palpitations and leg swelling.  Musculoskeletal: Positive for gait problem.  Skin: Negative for color change, pallor and rash.  Neurological: Negative for dizziness, light-headedness and headaches.  Psychiatric/Behavioral: Positive for confusion. Negative for agitation and sleep disturbance. The patient is not nervous/anxious.     Immunization History  Administered Date(s) Administered  . Influenza Split 02/18/2017  . Influenza Whole 03/17/2007, 02/16/2009, 01/30/2010, 02/16/2013  . Influenza-Unspecified 03/02/2014, 02/22/2015, 02/28/2016  . PPD Test 07/09/2010, 02/04/2015, 04/04/2015, 06/04/2015  . Pneumococcal Conjugate-13  01/13/2017  . Pneumococcal Polysaccharide-23 05/19/2005, 05/23/2013  . Td 05/19/2005  . Tetanus 10/06/2012  . Zoster 05/19/2012   Pertinent  Health Maintenance Due  Topic Date Due  . DEXA SCAN  11/19/1994  . INFLUENZA VACCINE  Completed  . PNA vac Low Risk Adult  Completed   Fall Risk  05/25/2017 03/13/2017 02/18/2017 01/05/2017 10/09/2015  Falls in the past year? Yes No Yes No No  Number falls in past yr: 1 - 2 or more - -  Injury with Fall? Yes - No - -  Comment - - - - -  Risk Factor Category  - - High Fall Risk - -  Risk for fall due to : History of fall(s) - - - -  Follow up Falls evaluation completed - - - -    Vitals:   05/25/17 1056  BP: 106/65  Pulse: 85  Resp: 20  Temp: 98.1 F (36.7 C)  SpO2: 94%  Weight: 131 lb 12.8 oz (59.8 kg)  Height: 5\' 7"  (1.702 m)   Body mass index is 20.64 kg/m. Physical Exam  Constitutional:  Thin frail elderly in no acute distress   HENT:  Head: Normocephalic.  Mouth/Throat: Oropharynx is clear and moist. No oropharyngeal exudate.  HOH  Eyes: Conjunctivae and EOM are normal. Pupils are equal, round, and reactive to light. Right eye exhibits no discharge. Left eye exhibits no discharge.  Neck: Normal range of motion. No JVD present. No thyromegaly present.  Cardiovascular: Normal rate, regular rhythm, normal heart sounds and intact distal pulses. Exam reveals no gallop and no friction rub.  No murmur heard. Pulmonary/Chest: Effort normal and breath sounds normal. No respiratory distress. She has no wheezes. She has no rales.  Oxygen via [place] in place   Abdominal: Soft. Bowel sounds are normal. She exhibits no distension. There is no tenderness. There is no rebound and no guarding.  Musculoskeletal: She exhibits no edema or tenderness.  Unsteady gait uses FWW  Lymphadenopathy:    She has no cervical adenopathy.  Neurological: She is alert. Coordination normal.  Pleasantly confused   Skin: Skin is warm and dry. No rash noted. No  erythema.  Psychiatric: She has a normal mood and affect.   Labs reviewed: Recent Labs    11/17/16 0454  04/08/17 1506 04/08/17 2336  04/20/17 04/30/17 0940 05/15/17 1136  NA 140   < > 139 138  --  141 142 140  K 3.6   < > 5.0 4.4  --  4.0 4.5 4.7  CL 103  --  104 110  --   --   --   --   CO2 31   < > 28 22  --   --  29 27  GLUCOSE 95   < >  157* 138*  --   --  92 93  BUN 10   < > 33* 26*  --  22* 31.3* 24.3  CREATININE 0.53   < > 0.78 0.55   < > 0.7 0.9 0.9  CALCIUM 8.5*   < > 8.9 7.7*  --   --  9.6 9.5   < > = values in this interval not displayed.   Recent Labs    04/08/17 2336 04/20/17 04/30/17 0940 05/15/17 1136  AST 18 19 19 16   ALT 16 12 11 12   ALKPHOS 51 78 81 75  BILITOT 0.7  --  0.30 0.22  PROT 5.8*  --  7.4 7.5  ALBUMIN 2.7*  --  3.4* 2.8*   Recent Labs    04/08/17 1506  04/11/17 0636 04/20/17 04/30/17 0940 05/15/17 1136  WBC 9.3   < > 3.8* 3.8 7.1 9.1  NEUTROABS 8.3*  --   --   --  5.8 7.1*  HGB 10.9*   < > 9.7* 11.7* 12.6 11.8  HCT 32.9*   < > 29.3* 33* 39.6 37.0  MCV 100.3*   < > 102.1*  --  106.5* 105.1*  PLT 82*   < > 79* 277 208 342   < > = values in this interval not displayed.   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 Chemotherapy and radiation therapy completed. EXAM: CT CHEST WITH CONTRAST TECHNIQUE: Multidetector CT imaging of the chest was performed during intravenous contrast administration. CONTRAST:  39mL ISOVUE-300 IOPAMIDOL (ISOVUE-300) INJECTION 61% COMPARISON:  PET-CT 01/08/2017.  Chest CT 12/04/2016. FINDINGS: Cardiovascular: There is atherosclerosis of the  aorta, great vessels and coronary arteries. No acute vascular findings. The heart is mildly enlarged. There is trace pericardial fluid versus thickening. Mediastinum/Nodes: There are no enlarged mediastinal, hilar or axillary lymph nodes.There are small mediastinal and hilar lymph nodes by The thyroid gland, trachea and esophagus demonstrate no significant findings. Lungs/Pleura: No pleural effusion or pneumothorax. The previously demonstrated cavitary, hypermetabolic lingular lesion is smaller, measuring 19 x 15 mm on image 66 (previously 32 x 25 mm). Surrounding adjacent opacity likely represents sequela of radiation. There is extensive chronic underlying lung disease with centrilobular emphysema, subpleural reticulation and traction bronchiectasis. Coarse subpleural calcifications are present in both lungs. The 11 mm right upper lobe nodules seen on the PET-CT has resolved. There is a stable asymmetric ground-glass density in the right upper lobe. No new or enlarging pulmonary nodules. Upper abdomen: No suspicious findings within the visualized upper abdomen. Probable perfusion defect superiorly in the left hepatic lobe without corresponding abnormality on previous PET-CT. Musculoskeletal/Chest wall: There is no chest wall mass or suspicious osseous finding. Advanced degenerative changes throughout the spine. IMPRESSION: 1. The treated left upper lobe squamous cell carcinoma has decreased in size. There is mild surrounding radiation change. 2. No enlarged mediastinal or hilar lymph nodes are seen. There is no residual right upper lobe nodularity as seen on PET-CT or other evidence of metastatic disease. 3. Similar extensive chronic fibrotic lung disease remaining suspicious for usual interstitial pneumonia. 4.  Aortic  Atherosclerosis (ICD10-I70.0). Electronically Signed   By: Richardean Sale M.D.   On: 05/15/2017 15:15    Assessment/Plan 1. Unsteady gait Status post fall episode seen in ED 05/16/2017 with  left fore head and left arm laceration.she remains high risk for fall.Continue to encourage to call for assistance.continue fall and safety precautions.   2. Laceration of forehead without complication, subsequent encounter Staples removed 05/24/2016 by facility Nurse.Left forehead laceration site healed without any signs of infections. Continue to monitor.   3. Weight loss Progressive weight loss noted: Wt 146 lbs (04/19/2017) wt 135.1 (04/22/2017) Wt 132 lbs (05/06/2017) Wt 131.8 lbs (05/20/2017) Continue to follow up with Facility registered [place].Continue on protein supplement.   Family/ staff Communication: Reviewed plan of care with patient and facility Nurse.   Labs/tests ordered: None   Dinah C [name], NP

## 2017-06-03 ENCOUNTER — Non-Acute Institutional Stay (SKILLED_NURSING_FACILITY): Payer: Medicare Other | Admitting: Family

## 2017-06-03 ENCOUNTER — Encounter: Payer: Self-pay | Admitting: Family

## 2017-06-03 DIAGNOSIS — K219 Gastro-esophageal reflux disease without esophagitis: Secondary | ICD-10-CM | POA: Diagnosis not present

## 2017-06-03 NOTE — Progress Notes (Signed)
Location:  [place] Room Number: 25 Place of Service:  SNF (31) Provider: Dinah [name] FNP-C  [name], MD  Patient Care Team: [name], MD as PCP - General (Internal Medicine) [name], [facility] [name], MD as Consulting Physician (Orthopedic Surgery) [name], MD as Consulting Physician (Physical Medicine and Rehabilitation) Melina Schools, MD as Consulting Physician (Orthopedic Surgery) [name], MD as Consulting Physician (Otolaryngology) [name], [name], NP as Nurse Practitioner (Family Medicine)  Extended Emergency Contact Information Primary Emergency Contact: [name] Address: [address] [place]          [name] of [place] Phone: [phone] Relation: Son Secondary Emergency Contact: [name] [place] of [place] Phone: [phone] Mobile Phone: [phone] Relation: Daughter  Code Status:  DNR Goals of care: Advanced Directive information Advanced Directives 06/03/2017  Does Patient Have a Medical Advance Directive? Yes  Type of Paramedic of [place];Living will;Out of facility DNR (pink MOST or yellow form)  Does patient want to make changes to medical advance directive? No - Patient declined  Copy of [place] in Chart? Yes  Pre-existing out of facility DNR order (yellow form or pink MOST form) Yellow form placed in chart (order not valid for inpatient use);Pink MOST form placed in chart (order not valid for inpatient use)     Chief Complaint  Patient presents with  . Acute Visit    Medication management     HPI:  Pt is a 84 y.o. female seen today at [facility] for an acute visit for medication management.she is seen in her room today per facility Nurse request.Nurse reports per patient's recent care plan meeting patient's Son request Prilosec 20 mg capsule to be discontinued due to no signs of acid reflux. She  denies any acute issues during visit though HPI and ROS limited due to cognitive impairment.    Past Medical History:  Diagnosis Date  . Anemia, unspecified 03/18/2011  . Anxiety state, unspecified 01/2011  . Blood in stool 06/15/2012  . Corns and callosities 07/01/2011  . Dementia   . Diverticulosis 02/2011  . Dizziness   . DJD (degenerative joint disease)   . Dysuria 06/17/2011  . Hemorrhoids 02/2011  . History of radiation therapy 03/10/17-04/22/17   left lung 60 Gy in 30 fractions  . Low back pain   . Lung cancer ([place])    left  . Mitral valve problem thickened   thickened  . Osteoarthritis of both hands 10/09/2015  . Osteoporosis 02/2011  . Other abnormal blood chemistry 0/30/2012  . Other acquired deformity of toe 12/16/2011  . Pain in joint, site unspecified 01/2012  . 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 Sacroiliitis, not elsewhere classified ([place]) 05/2011  . Seizures ([place])    Remotely  . Unspecified hypothyroidism 01/2011  . 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 Vitamin D deficiency 01/2011   Past Surgical History:  Procedure Laterality Date  . bletheroplasty    . CATARACT EXTRACTION W/ INTRAOCULAR LENS  IMPLANT, BILATERAL  2010  . CERVICAL LAMINECTOMY  2005  . CYSTOSCOPY WITH RETROGRADE PYELOGRAM, URETEROSCOPY AND STENT PLACEMENT Bilateral 11/16/2016   Procedure: CYSTOSCOPY WITH RETROGRADE PYELOGRAMS/CLOT EVACUATION;  Surgeon: Irine Seal, MD;  Location: [facility] ORS;  Service: Urology;  Laterality: Bilateral;  . HAMMER TOE SURGERY  2013   right 2nd toe Dr. Mallie Mussel  . IVC FILTER PLACEMENT ([facility] HX)    .  JOINT REPLACEMENT Bilateral 2002 Right, 1992 Left   knees  . TRANSURETHRAL RESECTION OF BLADDER TUMOR N/A 11/16/2016   Procedure: TRANSURETHRAL RESECTION OF BLADDER TUMOR (TURBT);  Surgeon: Irine Seal, MD;  Location: [facility];  Service: Urology;  Laterality: N/A;    Allergies  Allergen Reactions  . Macrodantin  [Nitrofurantoin]   . Morphine And Related Other (See Comments)    Altered mental state   . Sulfa Antibiotics Other (See Comments)    Reaction: unknown    Outpatient Encounter Medications as of 06/03/2017  Medication Sig  . acetaminophen (TYLENOL) 325 MG tablet Take 650 mg by mouth 2 (two) times daily as needed.   Marland Kitchen acetaminophen (TYLENOL) 325 MG tablet Take 650 mg by mouth 2 (two) times daily.  Marland Kitchen alum & mag hydroxide-simeth (MINTOX) 195-093-26 MG/5ML suspension Take 30 mLs by mouth every 4 (four) hours as needed for indigestion or heartburn.  . Calcium Carbonate-Vitamin D3 (CALCIUM 600-D) 600-400 MG-UNIT TABS Take 1 tablet by mouth daily.  . Cholecalciferol (VITAMIN D3) 5000 units TABS Take 1 tablet by mouth daily.   . clonazePAM (KLONOPIN) 0.5 MG tablet Take 1 tablet (0.5 mg total) by mouth daily. and as needed at bedtime for agitation or anxiety  . donepezil (ARICEPT) 10 MG tablet Take 10 mg by mouth at bedtime.   . DULoxetine (CYMBALTA) 30 MG capsule Take 30 mg by mouth daily.   Marland Kitchen guaiFENesin (MUCINEX) 600 MG 12 hr tablet Take 1 tablet (600 mg total) by mouth 2 (two) times daily.  Marland Kitchen ipratropium-albuterol (DUONEB) 0.5-2.5 (3) MG/3ML SOLN Take 3 mLs by nebulization every 6 (six) hours as needed.  . lactose free nutrition (BOOST) LIQD Take 237 mLs by mouth 3 (three) times daily with meals.   Marland Kitchen levothyroxine (SYNTHROID, LEVOTHROID) 100 MCG tablet Take 1 tablet (100 mcg total) by mouth daily before breakfast. For thyroid  . magnesium hydroxide (MILK OF MAGNESIA) 400 MG/5ML suspension Take 30 mLs by mouth as needed for mild constipation.  . meloxicam (MOBIC) 7.5 MG tablet Take 7.5 mg by mouth daily as needed.   . memantine (NAMENDA) 10 MG tablet Take 10 mg by mouth 2 (two) times daily.  . Multiple Vitamins-Minerals (THERA M PLUS PO) Take 1 tablet by mouth daily.   Marland Kitchen omega-3 acid ethyl esters (LOVAZA) 1 g capsule Take 1 g by mouth daily.  Marland Kitchen omeprazole (PRILOSEC) 20 MG capsule Take 20 mg by mouth  at bedtime.  . ondansetron (ZOFRAN) 4 MG tablet Take 4 mg by mouth every 8 (eight) hours as needed for nausea or vomiting.  . OXYGEN Inhale 2 L/min into the lungs as needed. To keep O2 Sat >90%  . phenytoin (DILANTIN) 100 MG ER capsule Take 100 mg by mouth daily.   . phenytoin (DILANTIN) 200 MG ER capsule Take 200 mg by mouth at bedtime.  Marland Kitchen QUEtiapine (SEROQUEL) 25 MG tablet Take 12.5 mg by mouth at bedtime. At 8 pm  . umeclidinium-vilanterol (ANORO ELLIPTA) 62.5-25 MCG/INH AEPB Inhale 1 puff into the lungs daily.   No facility-administered encounter medications on file as of 06/03/2017.     Review of Systems  Unable to perform ROS: Dementia  Constitutional: Negative for activity change, chills and fever.  Respiratory: Negative for chest tightness, shortness of breath and wheezing.        Chronic cough   Cardiovascular: Negative for chest pain, palpitations and leg swelling.  Gastrointestinal: Negative for abdominal distention, abdominal pain, constipation, diarrhea, nausea and vomiting.  Musculoskeletal: Positive for gait  problem.  Skin: Negative for color change, pallor and rash.  Hematological: Does not bruise/bleed easily.  Psychiatric/Behavioral: Positive for confusion. Negative for agitation. The patient is not nervous/anxious.     Immunization History  Administered Date(s) Administered  . Influenza Split 02/18/2017  . Influenza Whole 03/17/2007, 02/16/2009, 01/30/2010, 02/16/2013  . Influenza-Unspecified 03/02/2014, 02/22/2015, 02/28/2016  . PPD Test 07/09/2010, 02/04/2015, 04/04/2015, 06/04/2015  . Pneumococcal Conjugate-13 01/13/2017  . Pneumococcal Polysaccharide-23 05/19/2005, 05/23/2013  . Td 05/19/2005  . Tetanus 10/06/2012  . Zoster 05/19/2012   Pertinent  Health Maintenance Due  Topic Date Due  . DEXA SCAN  11/19/1994  . INFLUENZA VACCINE  Completed  . PNA vac Low Risk Adult  Completed   Fall Risk  05/25/2017 03/13/2017 02/18/2017 01/05/2017 10/09/2015  Falls in  the past year? Yes No Yes No No  Number falls in past yr: 1 - 2 or more - -  Injury with Fall? Yes - No - -  Comment - - - - -  Risk Factor Category  - - High Fall Risk - -  Risk for fall due to : History of fall(s) - - - -  Follow up Falls evaluation completed - - - -    Vitals:   06/03/17 1116  BP: 126/72  Pulse: 73  Resp: 16  Temp: (!) 97.1 F (36.2 C)  TempSrc: Oral  SpO2: 94%  Weight: 133 lb (60.3 kg)  Height: 5\' 7"  (1.702 m)   Body mass index is 20.83 kg/m. Physical Exam  Constitutional:  Thin frail elderly in no acute distress   HENT:  Head: Normocephalic.  Mouth/Throat: Oropharynx is clear and moist. No oropharyngeal exudate.  Eyes: Conjunctivae and EOM are normal. Pupils are equal, round, and reactive to light. Right eye exhibits no discharge. Left eye exhibits no discharge. No scleral icterus.  Neck: Normal range of motion. No JVD present. No thyromegaly present.  Cardiovascular: Normal rate, regular rhythm, normal heart sounds and intact distal pulses. Exam reveals no gallop and no friction rub.  No murmur heard. Pulmonary/Chest: Effort normal and breath sounds normal. No respiratory distress. She has no wheezes. She has no rales.  Abdominal: Soft. Bowel sounds are normal. She exhibits no distension. There is no tenderness. There is no rebound and no guarding.  Musculoskeletal: She exhibits no edema or tenderness.  Unsteady gait uses FWW but forgets to use walker at times.  Lymphadenopathy:    She has no cervical adenopathy.  Neurological: Coordination normal.  Pleasantly confused at her baseline   Skin: Skin is warm and dry. No rash noted. No erythema.  Psychiatric: She has a normal mood and affect.   Labs reviewed: Recent Labs    11/17/16 0454  04/08/17 1506 04/08/17 2336  04/27/17 04/30/17 0940 05/15/17 1136  NA 140   < > 139 138   < > 141 142 140  K 3.6   < > 5.0 4.4   < > 4.2 4.5 4.7  CL 103  --  104 110  --   --   --   --   CO2 31   < > 28 22   --   --  29 27  GLUCOSE 95   < > 157* 138*  --   --  92 93  BUN 10   < > 33* 26*   < > 27* 31.3* 24.3  CREATININE 0.53   < > 0.78 0.55   < > 0.9 0.9 0.9  CALCIUM 8.5*   < >  8.9 7.7*  --   --  9.6 9.5   < > = values in this interval not displayed.   Recent Labs    04/08/17 2336  04/27/17 04/30/17 0940 05/15/17 1136  AST 18   < > 17 19 16   ALT 16   < > 9 11 12   ALKPHOS 51   < > 77 81 75  BILITOT 0.7  --   --  0.30 0.22  PROT 5.8*  --   --  7.4 7.5  ALBUMIN 2.7*  --   --  3.4* 2.8*   < > = values in this interval not displayed.   Recent Labs    04/08/17 1506  04/11/17 0636  04/27/17 04/30/17 0940 05/15/17 1136  WBC 9.3   < > 3.8*   < > 5.2 7.1 9.1  NEUTROABS 8.3*  --   --   --   --  5.8 7.1*  HGB 10.9*   < > 9.7*   < > 12.6 12.6 11.8  HCT 32.9*   < > 29.3*   < > 38 39.6 37.0  MCV 100.3*   < > 102.1*  --   --  106.5* 105.1*  PLT 82*   < > 79*   < > 301 208 342   < > = values in this interval not displayed.   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 Chemotherapy and radiation therapy completed. EXAM: CT CHEST WITH CONTRAST TECHNIQUE: Multidetector CT imaging of the chest was performed during intravenous contrast administration. CONTRAST:  63mL ISOVUE-300 IOPAMIDOL (ISOVUE-300) INJECTION 61% COMPARISON:  PET-CT 01/08/2017.  Chest CT 12/04/2016. FINDINGS: Cardiovascular: There is atherosclerosis of the aorta, great vessels and coronary arteries. No acute vascular findings. The heart is mildly enlarged. There is trace pericardial fluid versus thickening. Mediastinum/Nodes: There are no enlarged mediastinal,  hilar or axillary lymph nodes.There are small mediastinal and hilar lymph nodes by The thyroid gland, trachea and esophagus demonstrate no significant findings. Lungs/Pleura: No pleural effusion or pneumothorax. The previously demonstrated cavitary, hypermetabolic lingular lesion is smaller, measuring 19 x 15 mm on image 66 (previously 32 x 25 mm). Surrounding adjacent opacity likely represents sequela of radiation. There is extensive chronic underlying lung disease with centrilobular emphysema, subpleural reticulation and traction bronchiectasis. Coarse subpleural calcifications are present in both lungs. The 11 mm right upper lobe nodules seen on the PET-CT has resolved. There is a stable asymmetric ground-glass density in the right upper lobe. No new or enlarging pulmonary nodules. Upper abdomen: No suspicious findings within the visualized upper abdomen. Probable perfusion defect superiorly in the left hepatic lobe without corresponding abnormality on previous PET-CT. Musculoskeletal/Chest wall: There is no chest wall mass or suspicious osseous finding. Advanced degenerative changes throughout the spine. IMPRESSION: 1. The treated left upper lobe squamous cell carcinoma has decreased in size. There is mild surrounding radiation change. 2. No enlarged mediastinal or hilar lymph nodes are seen. There is no residual right upper lobe nodularity as seen on PET-CT or other evidence of metastatic disease. 3. Similar extensive chronic fibrotic lung disease remaining  suspicious for usual interstitial pneumonia. 4.  Aortic Atherosclerosis (ICD10-I70.0). Electronically Signed   By: Richardean Sale M.D.   On: 05/15/2017 15:15   Assessment/Plan   Gastroesophageal reflux disease without esophagitis Asymptomatic.change omeprazole 20 mg Capsule one by mouth daily at bedtime as needed x 14 days then discontinue if none required.continue to monitor.   Family/ staff Communication: Reviewed plan of care with patient and  facility Nurse.  Labs/tests ordered: None   Dinah C [name], NP

## 2017-06-08 ENCOUNTER — Non-Acute Institutional Stay (SKILLED_NURSING_FACILITY): Payer: Medicare Other | Admitting: Family

## 2017-06-08 ENCOUNTER — Encounter: Payer: Self-pay | Admitting: Family

## 2017-06-08 DIAGNOSIS — R451 Restlessness and agitation: Secondary | ICD-10-CM

## 2017-06-08 DIAGNOSIS — R630 Anorexia: Secondary | ICD-10-CM

## 2017-06-08 DIAGNOSIS — R634 Abnormal weight loss: Secondary | ICD-10-CM

## 2017-06-08 MED ORDER — MIRTAZAPINE 7.5 MG PO TABS: 7.5000 mg | ORAL_TABLET | Freq: Every day | ORAL | Status: DC

## 2017-06-08 NOTE — Progress Notes (Addendum)
Location:  [place] Room Number: 25 Place of Service:  SNF (31) Provider: Jennessy Sandridge FNP-C  [name], MD  Patient Care Team: [name], MD as PCP - General (Internal Medicine) [name], [facility] [name], MD as Consulting Physician (Orthopedic Surgery) [name], MD as Consulting Physician (Physical Medicine and Rehabilitation) Melina Schools, MD as Consulting Physician (Orthopedic Surgery) [name], MD as Consulting Physician (Otolaryngology) Melizza Kanode, [name], NP as Nurse Practitioner (Family Medicine)  Extended Emergency Contact Information Primary Emergency Contact: [name] Address: [address]          [name] Phone: [phone] Relation: Son Secondary Emergency Contact: [name] [place] of [place] Phone: [phone] Mobile Phone: [phone] Relation: Daughter  Code Status:  DNR Goals of care: Advanced Directive information Advanced Directives 06/08/2017  Does Patient Have a Medical Advance Directive? Yes  Type of Advance Directive Out of facility DNR (pink MOST or yellow form);Living will;Healthcare Power of Attorney  Does patient want to make changes to medical advance directive? -  Copy of [place] in Chart? Yes  Pre-existing out of facility DNR order (yellow form or pink MOST form) Pink MOST form placed in chart (order not valid for inpatient use);Yellow form placed in chart (order not valid for inpatient use)     Chief Complaint  Patient presents with  . Acute Visit    behavior changes    HPI:  Pt is a 84 y.o. female seen today at [facility] for an acute visit for evaluation of increased agitation.Facility nurse reports patient has been more confused and agitated.she gets out of her room without her oxygen and wanders in other resident's room.Also yells at the facility staff.Facility staff also reports decreased oral  intake and refuses to be assisted with meals.She is confused during the visit unable to provide HPI and ROS information. No fever,chills or signs of urinary tract infections reported by nursing staff.Her weight log reviewed wt 146 lbs (04/19/2017);131.8 lbs (05/20/2016);128.4 lbs (06/05/2017).of note she has a medical history of squamous cell carcinoma of the left lung has completed radiation and chemotherapy.Also has a history of dementia.    Past Medical History:  Diagnosis Date  . Anemia, unspecified 03/18/2011  . Anxiety state, unspecified 01/2011  . Blood in stool 06/15/2012  . Corns and callosities 07/01/2011  . Dementia   . Diverticulosis 02/2011  . Dizziness   . DJD (degenerative joint disease)   . Dysuria 06/17/2011  . Hemorrhoids 02/2011  . History of radiation therapy 03/10/17-04/22/17   left lung 60 Gy in 30 fractions  . Low back pain   . Lung cancer ([place])    left  . Mitral valve problem thickened   thickened  . Osteoarthritis of both hands 10/09/2015  . Osteoporosis 02/2011  . Other abnormal blood chemistry 0/30/2012  . Other acquired deformity of toe 12/16/2011  . Pain in joint, site unspecified 01/2012  . 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 Sacroiliitis, not elsewhere classified ([place]) 05/2011  . Seizures ([place])    Remotely  . Unspecified hypothyroidism 01/2011  . 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 Vitamin D deficiency 01/2011   Past Surgical History:  Procedure Laterality Date  . bletheroplasty    . CATARACT EXTRACTION W/ INTRAOCULAR LENS  IMPLANT, BILATERAL  2010  . CERVICAL LAMINECTOMY  2005  . CYSTOSCOPY WITH RETROGRADE PYELOGRAM, URETEROSCOPY AND STENT PLACEMENT Bilateral  11/16/2016   Procedure: CYSTOSCOPY WITH RETROGRADE PYELOGRAMS/CLOT EVACUATION;  Surgeon: Irine Seal, MD;  Location: [facility] ORS;  Service: Urology;  Laterality: Bilateral;  . HAMMER TOE SURGERY  2013   right 2nd toe Dr. Mallie Mussel  . IVC FILTER  PLACEMENT ([facility] HX)    . JOINT REPLACEMENT Bilateral 2002 Right, 1992 Left   knees  . TRANSURETHRAL RESECTION OF BLADDER TUMOR N/A 11/16/2016   Procedure: TRANSURETHRAL RESECTION OF BLADDER TUMOR (TURBT);  Surgeon: Irine Seal, MD;  Location: [facility];  Service: Urology;  Laterality: N/A;    Allergies  Allergen Reactions  . Macrodantin [Nitrofurantoin]   . Morphine And Related Other (See Comments)    Altered mental state   . Sulfa Antibiotics Other (See Comments)    Reaction: unknown    Outpatient Encounter Medications as of 06/08/2017  Medication Sig  . acetaminophen (TYLENOL) 325 MG tablet Take 650 mg by mouth 2 (two) times daily as needed.   Marland Kitchen acetaminophen (TYLENOL) 325 MG tablet Take 650 mg by mouth 2 (two) times daily.  Marland Kitchen alum & mag hydroxide-simeth (MINTOX) 762-831-51 MG/5ML suspension Take 30 mLs by mouth every 4 (four) hours as needed for indigestion or heartburn.  . Calcium Carbonate-Vitamin D3 (CALCIUM 600-D) 600-400 MG-UNIT TABS Take 1 tablet by mouth daily.  . Cholecalciferol (VITAMIN D3) 5000 units TABS Take 1 tablet by mouth daily.   . clonazePAM (KLONOPIN) 0.5 MG tablet Take 1 tablet (0.5 mg total) by mouth daily. and as needed at bedtime for agitation or anxiety  . donepezil (ARICEPT) 10 MG tablet Take 10 mg by mouth at bedtime.   . DULoxetine (CYMBALTA) 30 MG capsule Take 30 mg by mouth daily.   Marland Kitchen guaiFENesin (MUCINEX) 600 MG 12 hr tablet Take 1 tablet (600 mg total) by mouth 2 (two) times daily.  Marland Kitchen ipratropium-albuterol (DUONEB) 0.5-2.5 (3) MG/3ML SOLN Take 3 mLs by nebulization every 6 (six) hours as needed.  . lactose free nutrition (BOOST) LIQD Take 237 mLs by mouth 3 (three) times daily with meals.   Marland Kitchen levothyroxine (SYNTHROID, LEVOTHROID) 100 MCG tablet Take 1 tablet (100 mcg total) by mouth daily before breakfast. For thyroid  . magnesium hydroxide (MILK OF MAGNESIA) 400 MG/5ML suspension Take 30 mLs by mouth as needed for mild constipation.  . meloxicam (MOBIC) 7.5  MG tablet Take 7.5 mg by mouth daily as needed.   . memantine (NAMENDA) 10 MG tablet Take 10 mg by mouth 2 (two) times daily.  . Multiple Vitamins-Minerals (THERA M PLUS PO) Take 1 tablet by mouth daily.   Marland Kitchen omega-3 acid ethyl esters (LOVAZA) 1 g capsule Take 1 g by mouth daily.  Marland Kitchen omeprazole (PRILOSEC) 20 MG capsule Take 20 mg by mouth at bedtime as needed.   . ondansetron (ZOFRAN) 4 MG tablet Take 4 mg by mouth every 8 (eight) hours as needed for nausea or vomiting.  . OXYGEN Inhale 2 L/min into the lungs as needed. To keep O2 Sat >90%  . phenytoin (DILANTIN) 100 MG ER capsule Take 100 mg by mouth daily.   . phenytoin (DILANTIN) 200 MG ER capsule Take 200 mg by mouth at bedtime.  Marland Kitchen QUEtiapine (SEROQUEL) 25 MG tablet Take 12.5 mg by mouth at bedtime. At 8 pm  . umeclidinium-vilanterol (ANORO ELLIPTA) 62.5-25 MCG/INH AEPB Inhale 1 puff into the lungs daily.   No facility-administered encounter medications on file as of 06/08/2017.     Review of Systems  Unable to perform ROS: Dementia (additional information provided by Nurse )  Constitutional: Positive for appetite change and unexpected weight change. Negative for chills, fatigue and fever.  HENT: Positive for hearing loss. Negative for congestion, rhinorrhea, sinus pressure, sinus pain, sneezing and sore throat.   Respiratory: Negative for chest tightness and wheezing.        Chronic cough on continuous oxygen via nasal cannula  Cardiovascular: Negative for chest pain, palpitations and leg swelling.  Gastrointestinal: Negative for abdominal distention, abdominal pain, constipation, diarrhea, nausea and vomiting.  Musculoskeletal: Positive for gait problem.  Skin: Negative for color change, pallor and rash.  Psychiatric/Behavioral: Positive for agitation and confusion. Negative for sleep disturbance.    Immunization History  Administered Date(s) Administered  . Influenza Split 02/18/2017  . Influenza Whole 03/17/2007, 02/16/2009,  01/30/2010, 02/16/2013  . Influenza-Unspecified 03/02/2014, 02/22/2015, 02/28/2016  . PPD Test 07/09/2010, 02/04/2015, 04/04/2015, 06/04/2015  . Pneumococcal Conjugate-13 01/13/2017  . Pneumococcal Polysaccharide-23 05/19/2005, 05/23/2013  . Td 05/19/2005  . Tetanus 10/06/2012  . Zoster 05/19/2012   Pertinent  Health Maintenance Due  Topic Date Due  . DEXA SCAN  11/19/1994  . INFLUENZA VACCINE  Completed  . PNA vac Low Risk Adult  Completed   Fall Risk  05/25/2017 03/13/2017 02/18/2017 01/05/2017 10/09/2015  Falls in the past year? Yes No Yes No No  Number falls in past yr: 1 - 2 or more - -  Injury with Fall? Yes - No - -  Comment - - - - -  Risk Factor Category  - - High Fall Risk - -  Risk for fall due to : History of fall(s) - - - -  Follow up Falls evaluation completed - - - -    Vitals:   06/08/17 1043  BP: 109/63  Pulse: 81  Resp: 20  Temp: 98.8 F (37.1 C)  SpO2: 94%  Weight: 128 lb 6.4 oz (58.2 kg)  Height: 5\' 7"  (1.702 m)   Body mass index is 20.11 kg/m. Physical Exam  Constitutional:  Thin frail elderly in no acute distress   HENT:  Head: Normocephalic.  Right Ear: External ear normal.  Left Ear: External ear normal.  Mouth/Throat: Oropharynx is clear and moist. No oropharyngeal exudate.  Eyes: Conjunctivae are normal. Pupils are equal, round, and reactive to light. Right eye exhibits no discharge. Left eye exhibits no discharge. No scleral icterus.  Neck: Normal range of motion. No JVD present. No thyromegaly present.  Cardiovascular: Normal rate, regular rhythm, normal heart sounds and intact distal pulses. Exam reveals no gallop and no friction rub.  No murmur heard. Pulmonary/Chest: Effort normal and breath sounds normal. No respiratory distress. She has no wheezes. She has no rales.  Oxygen 2 liters via nasal cannula though takes tubing off at times.   Abdominal: Soft. Bowel sounds are normal. She exhibits no distension. There is no tenderness. There is  no rebound and no guarding.  Musculoskeletal: She exhibits no edema or tenderness.  Unsteady gait use FWW though forgets at times.   Lymphadenopathy:    She has no cervical adenopathy.  Neurological: She is alert. Coordination normal.  Pleasantly confused   Skin: Skin is warm and dry. No rash noted. No erythema. No pallor.  Psychiatric: She has a normal mood and affect.    Labs reviewed: Recent Labs    11/17/16 0454  04/08/17 1506 04/08/17 2336  04/27/17 04/30/17 0940 05/15/17 1136  NA 140   < > 139 138   < > 141 142 140  K 3.6   < > 5.0 4.4   < >  4.2 4.5 4.7  CL 103  --  104 110  --   --   --   --   CO2 31   < > 28 22  --   --  29 27  GLUCOSE 95   < > 157* 138*  --   --  92 93  BUN 10   < > 33* 26*   < > 27* 31.3* 24.3  CREATININE 0.53   < > 0.78 0.55   < > 0.9 0.9 0.9  CALCIUM 8.5*   < > 8.9 7.7*  --   --  9.6 9.5   < > = values in this interval not displayed.   Recent Labs    04/08/17 2336  04/27/17 04/30/17 0940 05/15/17 1136  AST 18   < > 17 19 16   ALT 16   < > 9 11 12   ALKPHOS 51   < > 77 81 75  BILITOT 0.7  --   --  0.30 0.22  PROT 5.8*  --   --  7.4 7.5  ALBUMIN 2.7*  --   --  3.4* 2.8*   < > = values in this interval not displayed.   Recent Labs    04/08/17 1506  04/11/17 0636  04/27/17 04/30/17 0940 05/15/17 1136  WBC 9.3   < > 3.8*   < > 5.2 7.1 9.1  NEUTROABS 8.3*  --   --   --   --  5.8 7.1*  HGB 10.9*   < > 9.7*   < > 12.6 12.6 11.8  HCT 32.9*   < > 29.3*   < > 38 39.6 37.0  MCV 100.3*   < > 102.1*  --   --  106.5* 105.1*  PLT 82*   < > 79*   < > 301 208 342   < > = values in this interval not displayed.   Lab Results  Component Value Date   TSH 1.90 04/20/2017   Lab Results  Component Value Date   HGBA1C 5.4 03/08/2015   Lab Results  Component Value Date   CHOL 235 (H) 03/06/2009   HDL 108.40 03/06/2009   LDLDIRECT 108.2 03/06/2009    Significant Diagnostic Results in last 30 days:  Ct Chest W Contrast  Result Date:  05/15/2017 CLINICAL DATA:  Left upper lobe squamous cell lung cancer diagnosed 01/08/2017. Chemotherapy and radiation therapy completed. EXAM: CT CHEST WITH CONTRAST TECHNIQUE: Multidetector CT imaging of the chest was performed during intravenous contrast administration. CONTRAST:  81mL ISOVUE-300 IOPAMIDOL (ISOVUE-300) INJECTION 61% COMPARISON:  PET-CT 01/08/2017.  Chest CT 12/04/2016. FINDINGS: Cardiovascular: There is atherosclerosis of the aorta, great vessels and coronary arteries. No acute vascular findings. The heart is mildly enlarged. There is trace pericardial fluid versus thickening. Mediastinum/Nodes: There are no enlarged mediastinal, hilar or axillary lymph nodes.There are small mediastinal and hilar lymph nodes by The thyroid gland, trachea and esophagus demonstrate no significant findings. Lungs/Pleura: No pleural effusion or pneumothorax. The previously demonstrated cavitary, hypermetabolic lingular lesion is smaller, measuring 19 x 15 mm on image 66 (previously 32 x 25 mm). Surrounding adjacent opacity likely represents sequela of radiation. There is extensive chronic underlying lung disease with centrilobular emphysema, subpleural reticulation and traction bronchiectasis. Coarse subpleural calcifications are present in both lungs. The 11 mm right upper lobe nodules seen on the PET-CT has resolved. There is a stable asymmetric ground-glass density in the right upper lobe. No new or enlarging pulmonary nodules. Upper abdomen: No  suspicious findings within the visualized upper abdomen. Probable perfusion defect superiorly in the left hepatic lobe without corresponding abnormality on previous PET-CT. Musculoskeletal/Chest wall: There is no chest wall mass or suspicious osseous finding. Advanced degenerative changes throughout the spine. IMPRESSION: 1. The treated left upper lobe squamous cell carcinoma has decreased in size. There is mild surrounding radiation change. 2. No enlarged mediastinal or  hilar lymph nodes are seen. There is no residual right upper lobe nodularity as seen on PET-CT or other evidence of metastatic disease. 3. Similar extensive chronic fibrotic lung disease remaining suspicious for usual interstitial pneumonia. 4.  Aortic Atherosclerosis (ICD10-I70.0). Electronically Signed   By: Richardean Sale M.D.   On: 05/15/2017 15:15   Assessment/Plan  1. Agitation Afebrile.dementia,hx of lung Ca could be contributing to her confusion and agitation.With her frequent removal of her oxygen could be resulting to hypoxia worsening her confusion.increase Seroquel 25 mg tablet to one by mouth daily at bedtime.CBC/diff and CMP 06/11/2017 to rule out infectious and metabolic etiologies.continue to monitor and reorient.    2. Abnormal weight loss  wt 146 lbs (04/19/2017) 131.8 lbs (05/20/2016) 128.4 lbs (06/05/2017) Nurse reports patient has poor oral intake.Her lung cancer could be a factor too.continue to encourage protein supplements.continue to follow up with Registered Dietician.continue to monitor weight.      3. Poor appetite  With her weight progressive will start with on mirtazapine 7.5 mg tablet at bedtime. Continue to encourage oral intake.  Family/ staff Communication: Reviewed plan of care with patient and facility Nurse.   Labs/tests ordered:CBC/diff and CMP 06/11/2017   Sandrea Hughs, NP

## 2017-06-11 ENCOUNTER — Non-Acute Institutional Stay (SKILLED_NURSING_FACILITY): Payer: Medicare Other | Admitting: Family

## 2017-06-11 ENCOUNTER — Encounter: Payer: Self-pay | Admitting: *Deleted

## 2017-06-11 ENCOUNTER — Encounter: Payer: Self-pay | Admitting: Family

## 2017-06-11 DIAGNOSIS — R400 Somnolence: Secondary | ICD-10-CM

## 2017-06-11 DIAGNOSIS — R1311 Dysphagia, oral phase: Secondary | ICD-10-CM | POA: Diagnosis not present

## 2017-06-11 DIAGNOSIS — J189 Pneumonia, unspecified organism: Secondary | ICD-10-CM

## 2017-06-11 DIAGNOSIS — M818 Other osteoporosis without current pathological fracture: Secondary | ICD-10-CM | POA: Diagnosis not present

## 2017-06-11 DIAGNOSIS — R531 Weakness: Secondary | ICD-10-CM

## 2017-06-11 DIAGNOSIS — C3492 Malignant neoplasm of unspecified part of left bronchus or lung: Secondary | ICD-10-CM | POA: Diagnosis not present

## 2017-06-11 DIAGNOSIS — R4789 Other speech disturbances: Secondary | ICD-10-CM | POA: Diagnosis not present

## 2017-06-11 DIAGNOSIS — R410 Disorientation, unspecified: Secondary | ICD-10-CM

## 2017-06-11 DIAGNOSIS — D649 Anemia, unspecified: Secondary | ICD-10-CM | POA: Diagnosis not present

## 2017-06-11 DIAGNOSIS — M779 Enthesopathy, unspecified: Secondary | ICD-10-CM | POA: Diagnosis not present

## 2017-06-11 DIAGNOSIS — F0390 Unspecified dementia without behavioral disturbance: Secondary | ICD-10-CM | POA: Diagnosis not present

## 2017-06-11 LAB — COMPLETE METABOLIC PANEL WITH GFR
ALBUMIN: 3.2
ALT: 8
AST: 14
Alkaline Phosphatase: 76
BUN: 29 — AB (ref 4–21)
CREATININE: 0.63
Calcium: 9.1
Glucose: 115
POTASSIUM: 4.3
SODIUM: 145
TOTAL PROTEIN: 6.7 g/dL
Total Bilirubin: 0.3

## 2017-06-11 LAB — BASIC METABOLIC PANEL
BUN: 29 — AB (ref 4–21)
CREATININE: 0.6 (ref 0.5–1.1)
Glucose: 115
POTASSIUM: 4.3 (ref 3.4–5.3)
SODIUM: 145 (ref 137–147)

## 2017-06-11 LAB — HEPATIC FUNCTION PANEL
ALK PHOS: 76 (ref 25–125)
ALT: 8 (ref 7–35)
AST: 14 (ref 13–35)
Bilirubin, Total: 0.3

## 2017-06-11 LAB — CBC
HCT: 37.1
HEMOGLOBIN: 12.4
WBC: 13.1
platelet count: 253

## 2017-06-11 NOTE — Progress Notes (Addendum)
Location:  [facility] Room Number: 25 Place of Service:  SNF (31) Provider: Melanie Pellot FNP-C  [name], MD  Patient Care Team: [name], MD as PCP - General (Internal Medicine) [name], [facility] [name], MD as Consulting Physician (Orthopedic Surgery) [name], MD as Consulting Physician (Physical Medicine and Rehabilitation) Melina Schools, MD as Consulting Physician (Orthopedic Surgery) [name], MD as Consulting Physician (Otolaryngology) Lexxus Underhill, [name], NP as Nurse Practitioner (Family Medicine)  Extended Emergency Contact Information Primary Emergency Contact: [name] Address: [address]          [name] of [place] Phone: [phone] Relation: Son Secondary Emergency Contact: [name] [place] of [place] Phone: [phone] Mobile Phone: [phone] Relation: Daughter  Code Status:  DNR Goals of care: Advanced Directive information Advanced Directives 06/11/2017  Does Patient Have a Medical Advance Directive? Yes  Type of Advance Directive Living will;Healthcare Power of [place];Out of facility DNR (pink MOST or yellow form)  Does patient want to make changes to medical advance directive? -  Copy of [place] in Chart? Yes  Pre-existing out of facility DNR order (yellow form or pink MOST form) Pink MOST form placed in chart (order not valid for inpatient use);Yellow form placed in chart (order not valid for inpatient use)     Chief Complaint  Patient presents with  . Acute Visit    weakness,drowsy     HPI:  Pt is a 84 y.o. female seen today at [facility] for an acute visit for evaluation of increased confusion,weakness and drowsiness.she is seen in her room today per facility Nurse request.Nurse reports patient was seemed to be drowsy last evening and not eating her meals.Morning shift Nurse states patient has been more awake today  but seems to be more confused.Nurse also states patient does eat much for breakfast but will eat her lunch.she was recently started on Remeron for appetite.she tried to go into another resident room this morning.No fever or chills reported. Unable to obtain HPI and ROS from patient due to her cognitive impairment. She was talking to people in the TV during visit.     Past Medical History:  Diagnosis Date  . Anemia, unspecified 03/18/2011  . Anxiety state, unspecified 01/2011  . Blood in stool 06/15/2012  . Corns and callosities 07/01/2011  . Dementia   . Diverticulosis 02/2011  . Dizziness   . DJD (degenerative joint disease)   . Dysuria 06/17/2011  . Hemorrhoids 02/2011  . History of radiation therapy 03/10/17-04/22/17   left lung 60 Gy in 30 fractions  . Low back pain   . Lung cancer ([place])    left  . Mitral valve problem thickened   thickened  . Osteoarthritis of both hands 10/09/2015  . Osteoporosis 02/2011  . Other abnormal blood chemistry 0/30/2012  . Other acquired deformity of toe 12/16/2011  . Pain in joint, site unspecified 01/2012  . 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 Sacroiliitis, not elsewhere classified ([place]) 05/2011  . Seizures ([place])    Remotely  . Unspecified hypothyroidism 01/2011  . 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 Vitamin D deficiency 01/2011   Past Surgical History:  Procedure Laterality Date  . bletheroplasty    . CATARACT EXTRACTION W/ INTRAOCULAR LENS  IMPLANT, BILATERAL  2010  . CERVICAL LAMINECTOMY  2005  . CYSTOSCOPY WITH RETROGRADE PYELOGRAM, URETEROSCOPY AND STENT PLACEMENT Bilateral 11/16/2016  Procedure: CYSTOSCOPY WITH RETROGRADE PYELOGRAMS/CLOT EVACUATION;  Surgeon: Irine Seal, MD;  Location: [facility] ORS;  Service: Urology;  Laterality: Bilateral;  . HAMMER TOE SURGERY  2013   right 2nd toe Dr. Mallie Mussel  . IVC FILTER PLACEMENT ([facility] HX)    . JOINT REPLACEMENT Bilateral 2002 Right, 1992 Left    knees  . TRANSURETHRAL RESECTION OF BLADDER TUMOR N/A 11/16/2016   Procedure: TRANSURETHRAL RESECTION OF BLADDER TUMOR (TURBT);  Surgeon: Irine Seal, MD;  Location: [facility];  Service: Urology;  Laterality: N/A;    Allergies  Allergen Reactions  . Macrodantin [Nitrofurantoin]   . Morphine And Related Other (See Comments)    Altered mental state   . Sulfa Antibiotics Other (See Comments)    Reaction: unknown    Outpatient Encounter Medications as of 06/11/2017  Medication Sig  . acetaminophen (TYLENOL) 325 MG tablet Take 650 mg by mouth 2 (two) times daily as needed.   Marland Kitchen acetaminophen (TYLENOL) 325 MG tablet Take 650 mg by mouth 2 (two) times daily.  Marland Kitchen alum & mag hydroxide-simeth (MINTOX) 856-314-97 MG/5ML suspension Take 30 mLs by mouth every 4 (four) hours as needed for indigestion or heartburn.  . Calcium Carbonate-Vitamin D3 (CALCIUM 600-D) 600-400 MG-UNIT TABS Take 1 tablet by mouth daily.  . Cholecalciferol (VITAMIN D3) 5000 units TABS Take 1 tablet by mouth daily.   . clonazePAM (KLONOPIN) 0.5 MG tablet Take 0.5 mg by mouth 3 (three) times daily as needed for anxiety.  . donepezil (ARICEPT) 10 MG tablet Take 10 mg by mouth at bedtime.   . DULoxetine (CYMBALTA) 30 MG capsule Take 30 mg by mouth daily.   Marland Kitchen guaiFENesin (MUCINEX) 600 MG 12 hr tablet Take 1 tablet (600 mg total) by mouth 2 (two) times daily.  Marland Kitchen ipratropium-albuterol (DUONEB) 0.5-2.5 (3) MG/3ML SOLN Take 3 mLs by nebulization every 6 (six) hours as needed.  . lactose free nutrition (BOOST) LIQD Take 237 mLs by mouth 3 (three) times daily with meals.   Marland Kitchen levothyroxine (SYNTHROID, LEVOTHROID) 100 MCG tablet Take 1 tablet (100 mcg total) by mouth daily before breakfast. For thyroid  . magnesium hydroxide (MILK OF MAGNESIA) 400 MG/5ML suspension Take 30 mLs by mouth as needed for mild constipation.  . meloxicam (MOBIC) 7.5 MG tablet Take 7.5 mg by mouth daily as needed.   . memantine (NAMENDA) 10 MG tablet Take 10 mg by mouth 2  (two) times daily.  . mirtazapine (REMERON) 7.5 MG tablet Take 1 tablet (7.5 mg total) by mouth at bedtime.  . Multiple Vitamins-Minerals (THERA M PLUS PO) Take 1 tablet by mouth daily.   Marland Kitchen omega-3 acid ethyl esters (LOVAZA) 1 g capsule Take 1 g by mouth daily.  Marland Kitchen omeprazole (PRILOSEC) 20 MG capsule Take 20 mg by mouth at bedtime as needed.   . ondansetron (ZOFRAN) 4 MG tablet Take 4 mg by mouth every 8 (eight) hours as needed for nausea or vomiting.  . OXYGEN Inhale 2 L/min into the lungs as needed. To keep O2 Sat >90%  . phenytoin (DILANTIN) 100 MG ER capsule Take 100 mg by mouth daily.   . phenytoin (DILANTIN) 200 MG ER capsule Take 200 mg by mouth at bedtime.  Marland Kitchen QUEtiapine (SEROQUEL) 25 MG tablet Take 25 mg by mouth at bedtime. At 8 pm  . umeclidinium-vilanterol (ANORO ELLIPTA) 62.5-25 MCG/INH AEPB Inhale 1 puff into the lungs daily.  . [DISCONTINUED] clonazePAM (KLONOPIN) 0.5 MG tablet Take 1 tablet (0.5 mg total) by mouth daily. and as needed at  bedtime for agitation or anxiety (Patient not taking: Reported on 06/11/2017)   No facility-administered encounter medications on file as of 06/11/2017.     Review of Systems  Unable to perform ROS: Dementia (additional information provider by nursing staff)    Immunization History  Administered Date(s) Administered  . Influenza Split 02/18/2017  . Influenza Whole 03/17/2007, 02/16/2009, 01/30/2010, 02/16/2013  . Influenza-Unspecified 03/02/2014, 02/22/2015, 02/28/2016  . PPD Test 07/09/2010, 02/04/2015, 04/04/2015, 06/04/2015  . Pneumococcal Conjugate-13 01/13/2017  . Pneumococcal Polysaccharide-23 05/19/2005, 05/23/2013  . Td 05/19/2005  . Tetanus 10/06/2012  . Zoster 05/19/2012   Pertinent  Health Maintenance Due  Topic Date Due  . DEXA SCAN  11/19/1994  . INFLUENZA VACCINE  Completed  . PNA vac Low Risk Adult  Completed   Fall Risk  05/25/2017 03/13/2017 02/18/2017 01/05/2017 10/09/2015  Falls in the past year? Yes No Yes No No    Number falls in past yr: 1 - 2 or more - -  Injury with Fall? Yes - No - -  Comment - - - - -  Risk Factor Category  - - High Fall Risk - -  Risk for fall due to : History of fall(s) - - - -  Follow up Falls evaluation completed - - - -    Vitals:   06/11/17 1048  BP: 114/64  Pulse: 78  Resp: (!) 22  Temp: (!) 97 F (36.1 C)  SpO2: 93%  Weight: 128 lb 6.4 oz (58.2 kg)  Height: 5\' 7"  (1.702 m)   Body mass index is 20.11 kg/m. Physical Exam  Constitutional:  Thin frail elderly confused talking to people on the TV.she answered No to all questions.States lives in [place].   HENT:  Head: Normocephalic.  Right Ear: External ear normal.  Left Ear: External ear normal.  Mouth/Throat: Oropharynx is clear and moist. No oropharyngeal exudate.  Eyes: Conjunctivae and EOM are normal. Pupils are equal, round, and reactive to light. Right eye exhibits no discharge. Left eye exhibits no discharge. No scleral icterus.  Neck: Normal range of motion. No JVD present. No thyromegaly present.  Cardiovascular: Normal rate, regular rhythm, normal heart sounds and intact distal pulses. Exam reveals no gallop and no friction rub.  No murmur heard. Pulmonary/Chest: Effort normal.  Diminished lung sounds though not following commands to take deep breaths during assessment. Kept talking.   Abdominal: Soft. Bowel sounds are normal. She exhibits no distension. There is no tenderness. There is no rebound and no guarding.  Musculoskeletal: She exhibits no edema or tenderness.  Unsteady gait uses FWW but forgets most of the time.Arthritic changes to fingers noted   Lymphadenopathy:    She has no cervical adenopathy.  Neurological: She is alert.  Confused during visit states living in [place]   Skin: Skin is warm and dry. No rash noted. No erythema.  Psychiatric: She has a normal mood and affect.  Talking to the TV and making gestures.     Labs reviewed: Recent Labs    11/17/16 0454  04/08/17 1506  04/08/17 2336  04/30/17 0940 05/15/17 1136 06/11/17  NA 140   < > 139 138   < > 142 140 145  K 3.6   < > 5.0 4.4   < > 4.5 4.7 4.3  CL 103  --  104 110  --   --   --   --   CO2 31   < > 28 22  --  29 27  --  GLUCOSE 95   < > 157* 138*  --  92 93  --   BUN 10   < > 33* 26*   < > 31.3* 24.3 29*  CREATININE 0.53   < > 0.78 0.55   < > 0.9 0.9 0.63  CALCIUM 8.5*   < > 8.9 7.7*  --  9.6 9.5 9.1   < > = values in this interval not displayed.   Recent Labs    04/30/17 0940 05/15/17 1136 06/11/17  AST 19 16 14   ALT 11 12 8   ALKPHOS 81 75 76  BILITOT 0.30 0.22 0.3  PROT 7.4 7.5 6.7  ALBUMIN 3.4* 2.8* 3.2   Recent Labs    04/08/17 1506  04/11/17 0636  04/27/17 04/30/17 0940 05/15/17 1136 06/11/17  WBC 9.3   < > 3.8*   < > 5.2 7.1 9.1 13.1  NEUTROABS 8.3*  --   --   --   --  5.8 7.1*  --   HGB 10.9*   < > 9.7*   < > 12.6 12.6 11.8 12.4  HCT 32.9*   < > 29.3*   < > 38 39.6 37.0 37.1  MCV 100.3*   < > 102.1*  --   --  106.5* 105.1*  --   PLT 82*   < > 79*   < > 301 208 342  --    < > = values in this interval not displayed.   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 Chemotherapy and radiation therapy completed. EXAM: CT CHEST WITH CONTRAST TECHNIQUE: Multidetector CT imaging of the chest was performed during intravenous contrast administration. CONTRAST:  20mL ISOVUE-300 IOPAMIDOL (ISOVUE-300) INJECTION 61% COMPARISON:  PET-CT 01/08/2017.  Chest CT 12/04/2016. FINDINGS: Cardiovascular: There is atherosclerosis of the aorta, great vessels and coronary arteries. No acute vascular findings. The heart is mildly  enlarged. There is trace pericardial fluid versus thickening. Mediastinum/Nodes: There are no enlarged mediastinal, hilar or axillary lymph nodes.There are small mediastinal and hilar lymph nodes by The thyroid gland, trachea and esophagus demonstrate no significant findings. Lungs/Pleura: No pleural effusion or pneumothorax. The previously demonstrated cavitary, hypermetabolic lingular lesion is smaller, measuring 19 x 15 mm on image 66 (previously 32 x 25 mm). Surrounding adjacent opacity likely represents sequela of radiation. There is extensive chronic underlying lung disease with centrilobular emphysema, subpleural reticulation and traction bronchiectasis. Coarse subpleural calcifications are present in both lungs. The 11 mm right upper lobe nodules seen on the PET-CT has resolved. There is a stable asymmetric ground-glass density in the right upper lobe. No new or enlarging pulmonary nodules. Upper abdomen: No suspicious findings within the visualized upper abdomen. Probable perfusion defect superiorly in the left hepatic lobe without corresponding abnormality on previous PET-CT. Musculoskeletal/Chest wall: There is no chest wall mass or suspicious osseous finding. Advanced degenerative changes throughout the spine. IMPRESSION: 1. The treated left upper lobe squamous cell carcinoma has decreased in size. There is mild surrounding radiation change. 2. No enlarged mediastinal or hilar lymph nodes are seen. There is no residual right upper lobe  nodularity as seen on PET-CT or other evidence of metastatic disease. 3. Similar extensive chronic fibrotic lung disease remaining suspicious for usual interstitial pneumonia. 4.  Aortic Atherosclerosis (ICD10-I70.0). Electronically Signed   By: Richardean Sale M.D.   On: 05/15/2017 15:15    Assessment/Plan 1. Generalized weakness Afebrile.Worsening weakness.Her poor oral intake and lung cancer could be contributing to her physical decline.will obtain CBC/diff and CMP  to rule out infectious and metabolic causes.   2. Confusion Worsening altered mental status. Wandering into other residents rooms and talking to TV during visit.CBC/diff and CMP as above.continue on Seroquel and clonazepam.continue to redirect.   3. Drowsy Evening shift reported patient being drowsy unable to eat her meals.Patient has been awake this morning but remains confused.Medication reviewed has had clonazepam 0.5 mg at twice daily.Will reduce clonazepam to 0.25 mg tablet three times daily as needed.continue to monitor.   Family/ staff Communication: Reviewed plan of care with patient and facility Nurse supervisor  Labs/tests ordered: CBC/diff and CMP today.   06/11/2017: CBC lab resulted WBC 13.1 with left shift.Portable CXR Pa/lat to rule out pneumonia ordered. Urine specimen for U/A and C/S to rule out UTI.  06/12/2017: Portable CXR resulted: moderate patchy bilateral densities,left greater than the right,compatible with pneumonia.in comparison to prior exam,findings are mildly worsened most notably in the upper lobe.Also mild cardiomegaly,osteopenia and osteoarthritis.   Sandrea Hughs, NP

## 2017-06-12 DIAGNOSIS — M779 Enthesopathy, unspecified: Secondary | ICD-10-CM | POA: Diagnosis not present

## 2017-06-12 DIAGNOSIS — R4789 Other speech disturbances: Secondary | ICD-10-CM | POA: Diagnosis not present

## 2017-06-12 DIAGNOSIS — R05 Cough: Secondary | ICD-10-CM | POA: Diagnosis not present

## 2017-06-12 DIAGNOSIS — R1311 Dysphagia, oral phase: Secondary | ICD-10-CM | POA: Diagnosis not present

## 2017-06-12 DIAGNOSIS — F0390 Unspecified dementia without behavioral disturbance: Secondary | ICD-10-CM | POA: Diagnosis not present

## 2017-06-12 DIAGNOSIS — M818 Other osteoporosis without current pathological fracture: Secondary | ICD-10-CM | POA: Diagnosis not present

## 2017-06-12 MED ORDER — DOXYCYCLINE HYCLATE 100 MG PO TABS: 100.0000 mg | ORAL_TABLET | Freq: Two times a day (BID) | ORAL | 0 refills | Status: AC

## 2017-06-12 MED ORDER — SACCHAROMYCES BOULARDII 250 MG PO CAPS: 250.0000 mg | ORAL_CAPSULE | Freq: Two times a day (BID) | ORAL | Status: DC

## 2017-06-12 NOTE — Addendum Note (Signed)
Addended byMarlowe Sax C on: 06/12/2017 10:25 AM   Modules accepted: Orders

## 2017-06-15 ENCOUNTER — Telehealth: Payer: Self-pay | Admitting: Adult Health

## 2017-06-15 DIAGNOSIS — F0390 Unspecified dementia without behavioral disturbance: Secondary | ICD-10-CM | POA: Diagnosis not present

## 2017-06-15 DIAGNOSIS — M818 Other osteoporosis without current pathological fracture: Secondary | ICD-10-CM | POA: Diagnosis not present

## 2017-06-15 DIAGNOSIS — D649 Anemia, unspecified: Secondary | ICD-10-CM | POA: Diagnosis not present

## 2017-06-15 DIAGNOSIS — R4789 Other speech disturbances: Secondary | ICD-10-CM | POA: Diagnosis not present

## 2017-06-15 DIAGNOSIS — R1311 Dysphagia, oral phase: Secondary | ICD-10-CM | POA: Diagnosis not present

## 2017-06-15 DIAGNOSIS — M779 Enthesopathy, unspecified: Secondary | ICD-10-CM | POA: Diagnosis not present

## 2017-06-15 LAB — CBC AND DIFFERENTIAL
HCT: 35 — AB (ref 36–46)
HEMOGLOBIN: 11.7 — AB (ref 12.0–16.0)
Platelets: 259 (ref 150–399)
WBC: 9.2

## 2017-06-15 NOTE — Telephone Encounter (Signed)
Will hold for [employer] fax.

## 2017-06-16 DIAGNOSIS — M818 Other osteoporosis without current pathological fracture: Secondary | ICD-10-CM | POA: Diagnosis not present

## 2017-06-16 DIAGNOSIS — R4789 Other speech disturbances: Secondary | ICD-10-CM | POA: Diagnosis not present

## 2017-06-16 DIAGNOSIS — F0390 Unspecified dementia without behavioral disturbance: Secondary | ICD-10-CM | POA: Diagnosis not present

## 2017-06-16 DIAGNOSIS — M779 Enthesopathy, unspecified: Secondary | ICD-10-CM | POA: Diagnosis not present

## 2017-06-16 DIAGNOSIS — R1311 Dysphagia, oral phase: Secondary | ICD-10-CM | POA: Diagnosis not present

## 2017-06-16 NOTE — Telephone Encounter (Signed)
Nothing has yet been received Will continue to watch for

## 2017-06-16 NOTE — Telephone Encounter (Signed)
Jess, please advise if you have seen a fax come through from [facility] for pt.  Thanks!

## 2017-06-17 ENCOUNTER — Non-Acute Institutional Stay (SKILLED_NURSING_FACILITY): Payer: Medicare Other | Admitting: Internal Medicine

## 2017-06-17 ENCOUNTER — Encounter: Payer: Self-pay | Admitting: Internal Medicine

## 2017-06-17 DIAGNOSIS — J189 Pneumonia, unspecified organism: Secondary | ICD-10-CM

## 2017-06-17 DIAGNOSIS — E440 Moderate protein-calorie malnutrition: Secondary | ICD-10-CM | POA: Insufficient documentation

## 2017-06-17 DIAGNOSIS — G40909 Epilepsy, unspecified, not intractable, without status epilepticus: Secondary | ICD-10-CM | POA: Diagnosis not present

## 2017-06-17 DIAGNOSIS — M159 Polyosteoarthritis, unspecified: Secondary | ICD-10-CM

## 2017-06-17 DIAGNOSIS — M150 Primary generalized (osteo)arthritis: Secondary | ICD-10-CM | POA: Diagnosis not present

## 2017-06-17 DIAGNOSIS — E785 Hyperlipidemia, unspecified: Secondary | ICD-10-CM | POA: Diagnosis not present

## 2017-06-17 DIAGNOSIS — R634 Abnormal weight loss: Secondary | ICD-10-CM | POA: Diagnosis not present

## 2017-06-17 DIAGNOSIS — J9611 Chronic respiratory failure with hypoxia: Secondary | ICD-10-CM | POA: Diagnosis not present

## 2017-06-17 DIAGNOSIS — E039 Hypothyroidism, unspecified: Secondary | ICD-10-CM | POA: Diagnosis not present

## 2017-06-17 DIAGNOSIS — E43 Unspecified severe protein-calorie malnutrition: Secondary | ICD-10-CM | POA: Diagnosis not present

## 2017-06-17 DIAGNOSIS — F339 Major depressive disorder, recurrent, unspecified: Secondary | ICD-10-CM

## 2017-06-17 NOTE — Telephone Encounter (Signed)
Pt is calling back about the POC  [phone]

## 2017-06-17 NOTE — Telephone Encounter (Signed)
Called and spoke to Rebecca [name] giving him an update that the form has been received and TP will be signing it for then it to go back to the DME.  Rebecca [name] expressed understanding. Nothing further needed at this current time.

## 2017-06-17 NOTE — Telephone Encounter (Signed)
I now have the order form for Tammy Parrett to sign for this patient and I have given it to [facility]

## 2017-06-17 NOTE — Progress Notes (Signed)
Location:  [facility] Room Number: 25 Place of Service:  SNF [id number]) Provider:  [name] MD  [name], MD  Patient Care Team: [name], MD as PCP - General (Internal Medicine) [name], [facility] [name], MD as Consulting Physician (Orthopedic Surgery) [name], MD as Consulting Physician (Physical Medicine and Rehabilitation) Melina Schools, MD as Consulting Physician (Orthopedic Surgery) [name], MD as Consulting Physician (Otolaryngology) [name], [name], NP as Nurse Practitioner (Family Medicine)  Extended Emergency Contact Information Primary Emergency Contact: [name] Address: [address]          [name] of [place] Phone: [phone] Relation: Son Secondary Emergency Contact: [name] [place] of [place] Phone: [phone] Mobile Phone: [phone] Relation: Daughter  Code Status:  DNR  Goals of care: Advanced Directive information Advanced Directives 06/17/2017  Does Patient Have a Medical Advance Directive? Yes  Type of Paramedic of [place];Living will;Out of facility DNR (pink MOST or yellow form)  Does patient want to make changes to medical advance directive? No - Patient declined  Copy of [place] in Chart? Yes  Pre-existing out of facility DNR order (yellow form or pink MOST form) Yellow form placed in chart (order not valid for inpatient use);Pink MOST form placed in chart (order not valid for inpatient use)     Chief Complaint  Patient presents with  . Medical Management of Chronic Issues    Routine Visit     HPI:  Pt is a 84 y.o. female seen today for medical management of chronic diseases. She is seen in her room. She has behavioral issues. There are periods of increased confusion with anxiety where she removes her oxygen and then tries to get inside other residents room. Sometimes, redirection helps but  sometimes, medication is required. Per nursing, son was here to visit her this morning and have requested to reduce pill burden if possible. No recent fall reported by nursing. She requires assistance at times with feeding and other times, she feeds herself. Unable to obtain HPI and ROS with her dementia. She is currently on antibiotic for HCAP with bilateral patchy infiltrates She tells me today that she would like everyone to behave during super bowl and that she is playing for one of the teams.    Past Medical History:  Diagnosis Date  . Anemia, unspecified 03/18/2011  . Anxiety state, unspecified 01/2011  . Blood in stool 06/15/2012  . Corns and callosities 07/01/2011  . Dementia   . Diverticulosis 02/2011  . Dizziness   . DJD (degenerative joint disease)   . Dysuria 06/17/2011  . Hemorrhoids 02/2011  . History of radiation therapy 03/10/17-04/22/17   left lung 60 Gy in 30 fractions  . Low back pain   . Lung cancer ([place])    left  . Mitral valve problem thickened   thickened  . Osteoarthritis of both hands 10/09/2015  . Osteoporosis 02/2011  . Other abnormal blood chemistry 0/30/2012  . Other acquired deformity of toe 12/16/2011  . Pain in joint, site unspecified 01/2012  . 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 Sacroiliitis, not elsewhere classified ([place]) 05/2011  . Seizures ([place])    Remotely  . Unspecified hypothyroidism 01/2011  . 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 Vitamin D deficiency 01/2011   Past Surgical History:  Procedure Laterality Date  . bletheroplasty    . CATARACT EXTRACTION W/  INTRAOCULAR LENS  IMPLANT, BILATERAL  2010  . CERVICAL LAMINECTOMY  2005  . CYSTOSCOPY WITH RETROGRADE PYELOGRAM, URETEROSCOPY AND STENT PLACEMENT Bilateral 11/16/2016   Procedure: CYSTOSCOPY WITH RETROGRADE PYELOGRAMS/CLOT EVACUATION;  Surgeon: Irine Seal, MD;  Location: [facility];  Service: Urology;  Laterality: Bilateral;  . HAMMER TOE  SURGERY  2013   right 2nd toe Dr. Mallie Mussel  . IVC FILTER PLACEMENT ([facility] HX)    . JOINT REPLACEMENT Bilateral 2002 Right, 1992 Left   knees  . TRANSURETHRAL RESECTION OF BLADDER TUMOR N/A 11/16/2016   Procedure: TRANSURETHRAL RESECTION OF BLADDER TUMOR (TURBT);  Surgeon: Irine Seal, MD;  Location: [facility];  Service: Urology;  Laterality: N/A;    Allergies  Allergen Reactions  . Macrodantin [Nitrofurantoin]   . Morphine And Related Other (See Comments)    Altered mental state   . Sulfa Antibiotics Other (See Comments)    Reaction: unknown    Outpatient Encounter Medications as of 06/17/2017  Medication Sig  . acetaminophen (TYLENOL) 325 MG tablet Take 650 mg by mouth 2 (two) times daily as needed.   Marland Kitchen acetaminophen (TYLENOL) 325 MG tablet Take 650 mg by mouth 2 (two) times daily.  . Calcium Carbonate-Vitamin D3 (CALCIUM 600-D) 600-400 MG-UNIT TABS Take 1 tablet by mouth daily.  . Cholecalciferol (VITAMIN D3) 5000 units TABS Take 1 tablet by mouth daily.   . clonazePAM (KLONOPIN) 0.5 MG tablet Take 0.25 mg by mouth every 8 (eight) hours as needed for anxiety.   . donepezil (ARICEPT) 10 MG tablet Take 10 mg by mouth at bedtime.   Marland Kitchen doxycycline (VIBRA-TABS) 100 MG tablet Take 1 tablet (100 mg total) by mouth 2 (two) times daily for 10 days.  . DULoxetine (CYMBALTA) 30 MG capsule Take 30 mg by mouth daily.   Marland Kitchen guaiFENesin (MUCINEX) 600 MG 12 hr tablet Take 1 tablet (600 mg total) by mouth 2 (two) times daily.  Marland Kitchen ipratropium-albuterol (DUONEB) 0.5-2.5 (3) MG/3ML SOLN Take 3 mLs by nebulization every 6 (six) hours as needed.  . lactose free nutrition (BOOST) LIQD Take 237 mLs by mouth 3 (three) times daily as needed.   Marland Kitchen levothyroxine (SYNTHROID, LEVOTHROID) 100 MCG tablet Take 1 tablet (100 mcg total) by mouth daily before breakfast. For thyroid  . meloxicam (MOBIC) 7.5 MG tablet Take 7.5 mg by mouth daily as needed.   . memantine (NAMENDA) 10 MG tablet Take 10 mg by mouth 2 (two) times daily.    . mirtazapine (REMERON) 7.5 MG tablet Take 1 tablet (7.5 mg total) by mouth at bedtime.  . Multiple Vitamins-Minerals (THERA M PLUS PO) Take 1 tablet by mouth daily.   Marland Kitchen omega-3 acid ethyl esters (LOVAZA) 1 g capsule Take 1 g by mouth daily.  . ondansetron (ZOFRAN) 4 MG tablet Take 4 mg by mouth every 8 (eight) hours as needed for nausea or vomiting.  . OXYGEN Inhale 2 L/min into the lungs as needed. To keep O2 Sat >90%  . phenytoin (DILANTIN) 100 MG ER capsule Take 100 mg by mouth daily.   . phenytoin (DILANTIN) 200 MG ER capsule Take 200 mg by mouth at bedtime.  Marland Kitchen QUEtiapine (SEROQUEL) 25 MG tablet Take 25 mg by mouth at bedtime. At 8 pm  . saccharomyces boulardii (FLORASTOR) 250 MG capsule Take 1 capsule (250 mg total) by mouth 2 (two) times daily.  Marland Kitchen umeclidinium-vilanterol (ANORO ELLIPTA) 62.5-25 MCG/INH AEPB Inhale 1 puff into the lungs daily.  . [DISCONTINUED] alum & mag hydroxide-simeth ([place]) 200-200-20 MG/5ML suspension Take  30 mLs by mouth every 4 (four) hours as needed for indigestion or heartburn.  . [DISCONTINUED] magnesium hydroxide (MILK OF MAGNESIA) 400 MG/5ML suspension Take 30 mLs by mouth as needed for mild constipation.  . [DISCONTINUED] omeprazole (PRILOSEC) 20 MG capsule Take 20 mg by mouth at bedtime as needed.    No facility-administered encounter medications on file as of 06/17/2017.     Review of Systems  Unable to perform ROS: Dementia    Immunization History  Administered Date(s) Administered  . Influenza Split 02/18/2017  . Influenza Whole 03/17/2007, 02/16/2009, 01/30/2010, 02/16/2013  . Influenza-Unspecified 03/02/2014, 02/22/2015, 02/28/2016  . PPD Test 07/09/2010, 02/04/2015, 04/04/2015, 06/04/2015  . Pneumococcal Conjugate-13 01/13/2017  . Pneumococcal Polysaccharide-23 05/19/2005, 05/23/2013  . Td 05/19/2005  . Tetanus 10/06/2012  . Zoster 05/19/2012   Pertinent  Health Maintenance Due  Topic Date Due  . DEXA SCAN  11/19/1994  . INFLUENZA  VACCINE  Completed  . PNA vac Low Risk Adult  Completed   Fall Risk  05/25/2017 03/13/2017 02/18/2017 01/05/2017 10/09/2015  Falls in the past year? Yes No Yes No No  Number falls in past yr: 1 - 2 or more - -  Injury with Fall? Yes - No - -  Comment - - - - -  Risk Factor Category  - - High Fall Risk - -  Risk for fall due to : History of fall(s) - - - -  Follow up Falls evaluation completed - - - -   Functional Status Survey:    Vitals:   06/17/17 1446  BP: 128/71  Pulse: 68  Resp: 20  Temp: 98 F (36.7 C)  TempSrc: Oral  SpO2: 93%  Weight: 128 lb 6.4 oz (58.2 kg)  Height: 5\' 7"  (8.676 m)   Body mass index is 20.11 kg/m.   Wt Readings from Last 3 Encounters:  06/17/17 128 lb 6.4 oz (58.2 kg)  06/11/17 128 lb 6.4 oz (58.2 kg)  06/08/17 128 lb 6.4 oz (58.2 kg)   Physical Exam  Constitutional:  Thin built, frail, in no acute distress  HENT:  Head: Normocephalic and atraumatic.  Mouth/Throat: Oropharynx is clear and moist.  Eyes: Conjunctivae and EOM are normal. Right eye exhibits no discharge.  Neck: Neck supple.  Cardiovascular: Normal rate and regular rhythm.  Pulmonary/Chest: Effort normal. No respiratory distress. She has no wheezes. She has no rales.  Poor air movement bilaterally, on oxygen by nasal canula  Abdominal: Soft. Bowel sounds are normal. There is no tenderness.  Musculoskeletal: She exhibits deformity.  Able to move all 4 extremities, on wheelchair, uses walker, unsteady gait  Lymphadenopathy:    She has no cervical adenopathy.  Neurological: She is alert.  Oriented to self only  Skin: Skin is warm and dry. She is not diaphoretic.  Psychiatric:  Pleasantly confused    Labs reviewed: Recent Labs    11/17/16 0454  04/08/17 1506 04/08/17 2336  04/30/17 0940 05/15/17 1136 06/11/17  NA 140   < > 139 138   < > 142 140 145  145  K 3.6   < > 5.0 4.4   < > 4.5 4.7 4.3  4.3  CL 103  --  104 110  --   --   --   --   CO2 31   < > 28 22  --  29 27   --   GLUCOSE 95   < > 157* 138*  --  92 93  --   BUN  10   < > 33* 26*   < > 31.3* 24.3 29*  29*  CREATININE 0.53   < > 0.78 0.55   < > 0.9 0.9 0.6  0.63  CALCIUM 8.5*   < > 8.9 7.7*  --  9.6 9.5 9.1   < > = values in this interval not displayed.   Recent Labs    04/30/17 0940 05/15/17 1136 06/11/17  AST 19 16 14  14   ALT 11 12 8  8   ALKPHOS 81 75 76  76  BILITOT 0.30 0.22 0.3  PROT 7.4 7.5 6.7  ALBUMIN 3.4* 2.8* 3.2   Recent Labs    04/08/17 1506  04/11/17 0636  04/30/17 0940 05/15/17 1136 06/11/17 06/15/17  WBC 9.3   < > 3.8*   < > 7.1 9.1 13.1 9.2  NEUTROABS 8.3*  --   --   --  5.8 7.1*  --   --   HGB 10.9*   < > 9.7*   < > 12.6 11.8 12.4 11.7*  HCT 32.9*   < > 29.3*   < > 39.6 37.0 37.1 35*  MCV 100.3*   < > 102.1*  --  106.5* 105.1*  --   --   PLT 82*   < > 79*   < > 208 342  --  259   < > = values in this interval not displayed.   Lab Results  Component Value Date   TSH 1.90 04/20/2017   Lab Results  Component Value Date   HGBA1C 5.4 03/08/2015   Lab Results  Component Value Date   CHOL 235 (H) 03/06/2009   HDL 108.40 03/06/2009   LDLDIRECT 108.2 03/06/2009    Significant Diagnostic Results in last 30 days:  No results found.  Assessment/Plan  OA Has OA and joint pain. Currently on tylenol 650 mg bid with prn in between. Also on prn meloxicam and has not required it. D/c meloxicam for non use.   Seizure disorder Continue phenytoin 200 mg qhs and monitor  Severe protein calorie malnutrition Has lost weight about 6 lbs in few weeks. Refusing boost supplement. Encourage this for now. Continue remeron but increase to 15 mg daily to see if this will help her mood and appetite. Decline anticipated with her medical comorbidities including dementia.   Hypothyroidism Lab Results  Component Value Date   TSH 1.90 04/20/2017   Continue levothyroxine 146mcg daily and monitor.   Hyperlipidemia D/c lovaza, unsure how beneficial it is at this point for  patient  Chronic respiratory failure Has history of ILD, PE, lung cancer. Continue o2 by nasal canula, anoro ellipta, scheduled guaifenesin and prn duoneb.   Weight loss Increase remeron to help stimulate appetite. Encourage po intake as tolerated. Decrease donepezil to 5 mg daily with weight loss. cymbalta can also contribute to weight loss. See below.  Chronic depression Mood appears stable. Increased dosing of remeron as above. Due to her weight loss, fatigue and increased confusion with dementia, attempt GDR of cymbalta to 30 mg every other day for 2 weeks and stop. Continue seroquel.   HCAP Continue doxycycline until 06/22/17 and aspiration precautions. Continue o2 and breathing treatment  Advanced dementia with behavioral disturbance Ongoing, decline anticipated. Continue namenda, decrease donepezil with weight loss. Continue seroquel. remeron dosing increased and cymbalta being tapered off. Avoid pill burden. Will need to review goals of care with patient's HCPOA. If she does not show much improvement with treatment of pneumonia, will need to  consider palliative care.    Family/ staff Communication: reviewed care plan with patient and charge nurse.   Labs/tests ordered:  None   I spent 40 minutes in total face-to-face time with the patient, more than 50% of which was spent in coordination of care, reviewing medication and discussing or reviewing the diagnosis with patient and nursing. Some prn medication discontinued for non use. Several medication changes made.      [name], MD Internal Medicine [facility] [address] [place], [place] [address] Cell Phone (Monday-Friday 8 am - 5 pm): [phone] On Call: [phone] and follow prompts after 5 pm and on weekends Office Phone: [phone] Office Fax: [phone]

## 2017-06-18 DIAGNOSIS — F0390 Unspecified dementia without behavioral disturbance: Secondary | ICD-10-CM | POA: Diagnosis not present

## 2017-06-18 DIAGNOSIS — M818 Other osteoporosis without current pathological fracture: Secondary | ICD-10-CM | POA: Diagnosis not present

## 2017-06-18 DIAGNOSIS — R4789 Other speech disturbances: Secondary | ICD-10-CM | POA: Diagnosis not present

## 2017-06-18 DIAGNOSIS — R1311 Dysphagia, oral phase: Secondary | ICD-10-CM | POA: Diagnosis not present

## 2017-06-18 DIAGNOSIS — M779 Enthesopathy, unspecified: Secondary | ICD-10-CM | POA: Diagnosis not present

## 2017-06-22 DIAGNOSIS — M818 Other osteoporosis without current pathological fracture: Secondary | ICD-10-CM | POA: Diagnosis not present

## 2017-06-22 DIAGNOSIS — R1311 Dysphagia, oral phase: Secondary | ICD-10-CM | POA: Diagnosis not present

## 2017-06-22 DIAGNOSIS — M779 Enthesopathy, unspecified: Secondary | ICD-10-CM | POA: Diagnosis not present

## 2017-06-22 DIAGNOSIS — R4789 Other speech disturbances: Secondary | ICD-10-CM | POA: Diagnosis not present

## 2017-06-22 DIAGNOSIS — F0390 Unspecified dementia without behavioral disturbance: Secondary | ICD-10-CM | POA: Diagnosis not present

## 2017-06-24 DIAGNOSIS — M818 Other osteoporosis without current pathological fracture: Secondary | ICD-10-CM | POA: Diagnosis not present

## 2017-06-24 DIAGNOSIS — F0390 Unspecified dementia without behavioral disturbance: Secondary | ICD-10-CM | POA: Diagnosis not present

## 2017-06-24 DIAGNOSIS — R1311 Dysphagia, oral phase: Secondary | ICD-10-CM | POA: Diagnosis not present

## 2017-06-24 DIAGNOSIS — M779 Enthesopathy, unspecified: Secondary | ICD-10-CM | POA: Diagnosis not present

## 2017-06-24 DIAGNOSIS — R4789 Other speech disturbances: Secondary | ICD-10-CM | POA: Diagnosis not present

## 2017-06-25 DIAGNOSIS — M779 Enthesopathy, unspecified: Secondary | ICD-10-CM | POA: Diagnosis not present

## 2017-06-25 DIAGNOSIS — R4789 Other speech disturbances: Secondary | ICD-10-CM | POA: Diagnosis not present

## 2017-06-25 DIAGNOSIS — Z0189 Encounter for other specified special examinations: Secondary | ICD-10-CM | POA: Diagnosis not present

## 2017-06-25 DIAGNOSIS — M818 Other osteoporosis without current pathological fracture: Secondary | ICD-10-CM | POA: Diagnosis not present

## 2017-06-25 DIAGNOSIS — F0390 Unspecified dementia without behavioral disturbance: Secondary | ICD-10-CM | POA: Diagnosis not present

## 2017-06-25 DIAGNOSIS — R1311 Dysphagia, oral phase: Secondary | ICD-10-CM | POA: Diagnosis not present

## 2017-06-26 ENCOUNTER — Non-Acute Institutional Stay (SKILLED_NURSING_FACILITY): Payer: Medicare Other | Admitting: Family

## 2017-06-26 ENCOUNTER — Encounter: Payer: Self-pay | Admitting: Family

## 2017-06-26 DIAGNOSIS — G40909 Epilepsy, unspecified, not intractable, without status epilepticus: Secondary | ICD-10-CM | POA: Diagnosis not present

## 2017-06-26 DIAGNOSIS — M818 Other osteoporosis without current pathological fracture: Secondary | ICD-10-CM | POA: Diagnosis not present

## 2017-06-26 DIAGNOSIS — F0390 Unspecified dementia without behavioral disturbance: Secondary | ICD-10-CM | POA: Diagnosis not present

## 2017-06-26 DIAGNOSIS — R7889 Finding of other specified substances, not normally found in blood: Secondary | ICD-10-CM

## 2017-06-26 DIAGNOSIS — R1311 Dysphagia, oral phase: Secondary | ICD-10-CM | POA: Diagnosis not present

## 2017-06-26 DIAGNOSIS — R4789 Other speech disturbances: Secondary | ICD-10-CM | POA: Diagnosis not present

## 2017-06-26 DIAGNOSIS — M779 Enthesopathy, unspecified: Secondary | ICD-10-CM | POA: Diagnosis not present

## 2017-06-26 NOTE — Progress Notes (Signed)
Location:  [place] Room Number: 25 Place of Service:  SNF (31) Provider: Niv Darley FNP-C  [name], MD  Patient Care Team: [name], MD as PCP - General (Internal Medicine) [name], [facility] [name], MD as Consulting Physician (Orthopedic Surgery) [name], MD as Consulting Physician (Physical Medicine and Rehabilitation) Melina Schools, MD as Consulting Physician (Orthopedic Surgery) [name], MD as Consulting Physician (Otolaryngology) Riko Lumsden, [name], NP as Nurse Practitioner (Family Medicine)  Extended Emergency Contact Information Primary Emergency Contact: [name] Address: [address]          [name] of [place] Phone: [phone] Relation: Son Secondary Emergency Contact: [name] [place] of [place] Phone: [phone] Mobile Phone: [phone] Relation: Daughter  Code Status:  DNR Goals of care: Advanced Directive information Advanced Directives 06/26/2017  Does Patient Have a Medical Advance Directive? Yes  Type of Paramedic of [place];Out of facility DNR (pink MOST or yellow form);Living will  Does patient want to make changes to medical advance directive? -  Copy of [place] in Chart? Yes  Pre-existing out of facility DNR order (yellow form or pink MOST form) Yellow form placed in chart (order not valid for inpatient use);Pink MOST form placed in chart (order not valid for inpatient use)     Chief Complaint  Patient presents with  . Acute Visit    abnormal labs    HPI:  Pt is a 84 y.o. female seen today at [facility] for an acute visit for evaluation of abnormal lab results.she is seen in her room today.she is confused unable to provider any HPI or ROS information.Her recent dilantin level result was 5.7 (06/25/2017). Facility Nurse reports patient refuses to take medication at times. No seizure  activity reported.   Past Medical History:  Diagnosis Date  . Anemia, unspecified 03/18/2011  . Anxiety state, unspecified 01/2011  . Blood in stool 06/15/2012  . Corns and callosities 07/01/2011  . Dementia   . Diverticulosis 02/2011  . Dizziness   . DJD (degenerative joint disease)   . Dysuria 06/17/2011  . Hemorrhoids 02/2011  . History of radiation therapy 03/10/17-04/22/17   left lung 60 Gy in 30 fractions  . Low back pain   . Lung cancer ([place])    left  . Mitral valve problem thickened   thickened  . Osteoarthritis of both hands 10/09/2015  . Osteoporosis 02/2011  . Other abnormal blood chemistry 0/30/2012  . Other acquired deformity of toe 12/16/2011  . Pain in joint, site unspecified 01/2012  . 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 Sacroiliitis, not elsewhere classified ([place]) 05/2011  . Seizures ([place])    Remotely  . Unspecified hypothyroidism 01/2011  . 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 Vitamin D deficiency 01/2011   Past Surgical History:  Procedure Laterality Date  . bletheroplasty    . CATARACT EXTRACTION W/ INTRAOCULAR LENS  IMPLANT, BILATERAL  2010  . CERVICAL LAMINECTOMY  2005  . CYSTOSCOPY WITH RETROGRADE PYELOGRAM, URETEROSCOPY AND STENT PLACEMENT Bilateral 11/16/2016   Procedure: CYSTOSCOPY WITH RETROGRADE PYELOGRAMS/CLOT EVACUATION;  Surgeon: Irine Seal, MD;  Location: [facility];  Service: Urology;  Laterality: Bilateral;  . HAMMER TOE SURGERY  2013   right 2nd toe Dr. Mallie Mussel  . IVC FILTER PLACEMENT ([facility] HX)    . JOINT REPLACEMENT Bilateral 2002 Right, 1992 Left   knees  . TRANSURETHRAL RESECTION  OF BLADDER TUMOR N/A 11/16/2016   Procedure: TRANSURETHRAL RESECTION OF BLADDER TUMOR (TURBT);  Surgeon: Irine Seal, MD;  [facility];  Service: Urology;  Laterality: N/A;    Allergies  Allergen Reactions  . Macrodantin [Nitrofurantoin]   . Morphine And Related Other (See Comments)    Altered mental  state   . Sulfa Antibiotics Other (See Comments)    Reaction: unknown    Outpatient Encounter Medications as of 06/26/2017  Medication Sig  . acetaminophen (TYLENOL) 325 MG tablet Take 650 mg by mouth 2 (two) times daily as needed.   Marland Kitchen acetaminophen (TYLENOL) 325 MG tablet Take 650 mg by mouth 2 (two) times daily.  Marland Kitchen alum & mag hydroxide-simeth (MINTOX) 578-469-62 MG/5ML suspension Take 30 mLs by mouth every 4 (four) hours as needed for indigestion or heartburn.  . Calcium Carbonate-Vitamin D3 (CALCIUM 600-D) 600-400 MG-UNIT TABS Take 1 tablet by mouth daily.  . Cholecalciferol (VITAMIN D3) 5000 units TABS Take 1 tablet by mouth daily.   . clonazePAM (KLONOPIN) 0.5 MG tablet Take 0.25 mg by mouth every 8 (eight) hours as needed for anxiety.   . donepezil (ARICEPT) 10 MG tablet Take 5 mg by mouth at bedtime.   . DULoxetine (CYMBALTA) 20 MG capsule Take 20 mg by mouth every other day.  Marland Kitchen guaiFENesin (MUCINEX) 600 MG 12 hr tablet Take 1 tablet (600 mg total) by mouth 2 (two) times daily.  Marland Kitchen ipratropium-albuterol (DUONEB) 0.5-2.5 (3) MG/3ML SOLN Take 3 mLs by nebulization every 6 (six) hours as needed.  . lactose free nutrition (BOOST) LIQD Take 237 mLs by mouth 3 (three) times daily as needed.   Marland Kitchen levothyroxine (SYNTHROID, LEVOTHROID) 100 MCG tablet Take 1 tablet (100 mcg total) by mouth daily before breakfast. For thyroid  . magnesium hydroxide (MILK OF MAGNESIA) 400 MG/5ML suspension Take 30 mLs by mouth daily as needed for mild constipation.  . memantine (NAMENDA) 10 MG tablet Take 10 mg by mouth 2 (two) times daily.  . mirtazapine (REMERON) 15 MG tablet Take 15 mg by mouth at bedtime.  . Multiple Vitamins-Minerals (THERA M PLUS PO) Take 1 tablet by mouth daily.   . OXYGEN Inhale 2 L/min into the lungs as needed. To keep O2 Sat >90%  . phenytoin (DILANTIN) 100 MG ER capsule Take 100 mg by mouth daily.   . phenytoin (DILANTIN) 200 MG ER capsule Take 200 mg by mouth at bedtime.  Marland Kitchen QUEtiapine  (SEROQUEL) 25 MG tablet Take 25 mg by mouth at bedtime. At 8 pm  . umeclidinium-vilanterol (ANORO ELLIPTA) 62.5-25 MCG/INH AEPB Inhale 1 puff into the lungs daily.  . [DISCONTINUED] DULoxetine (CYMBALTA) 30 MG capsule Take 30 mg by mouth daily.   . [DISCONTINUED] meloxicam (MOBIC) 7.5 MG tablet Take 7.5 mg by mouth daily as needed.   . [DISCONTINUED] mirtazapine (REMERON) 7.5 MG tablet Take 1 tablet (7.5 mg total) by mouth at bedtime.  . [DISCONTINUED] omega-3 acid ethyl esters (LOVAZA) 1 g capsule Take 1 g by mouth daily.  . [DISCONTINUED] ondansetron (ZOFRAN) 4 MG tablet Take 4 mg by mouth every 8 (eight) hours as needed for nausea or vomiting.  . [DISCONTINUED] saccharomyces boulardii (FLORASTOR) 250 MG capsule Take 1 capsule (250 mg total) by mouth 2 (two) times daily.   No facility-administered encounter medications on file as of 06/26/2017.     Review of Systems  Unable to perform ROS: Dementia    Immunization History  Administered Date(s) Administered  . Influenza Split 02/18/2017  . Influenza  Whole 03/17/2007, 02/16/2009, 01/30/2010, 02/16/2013  . Influenza-Unspecified 03/02/2014, 02/22/2015, 02/28/2016  . PPD Test 07/09/2010, 02/04/2015, 04/04/2015, 06/04/2015  . Pneumococcal Conjugate-13 01/13/2017  . Pneumococcal Polysaccharide-23 05/19/2005, 05/23/2013  . Td 05/19/2005  . Tetanus 10/06/2012  . Zoster 05/19/2012   Pertinent  Health Maintenance Due  Topic Date Due  . DEXA SCAN  11/19/1994  . INFLUENZA VACCINE  Completed  . PNA vac Low Risk Adult  Completed   Fall Risk  05/25/2017 03/13/2017 02/18/2017 01/05/2017 10/09/2015  Falls in the past year? Yes No Yes No No  Number falls in past yr: 1 - 2 or more - -  Injury with Fall? Yes - No - -  Comment - - - - -  Risk Factor Category  - - High Fall Risk - -  Risk for fall due to : History of fall(s) - - - -  Follow up Falls evaluation completed - - - -   Functional Status Survey:    Vitals:   06/26/17 0848  BP: 109/66    Pulse: 89  Resp: 20  Temp: 98.7 F (37.1 C)  SpO2: 92%  Weight: 120 lb 12.8 oz (54.8 kg)  Height: 5\' 7"  (1.702 m)   Body mass index is 18.92 kg/m. Physical Exam  Constitutional:  Thin frail elderly confused during visit in no acute distress  HENT:  Head: Normocephalic.  Right Ear: External ear normal.  Left Ear: External ear normal.  Mouth/Throat: Oropharynx is clear and moist. No oropharyngeal exudate.  Eyes: Conjunctivae and EOM are normal. Pupils are equal, round, and reactive to light. Right eye exhibits no discharge. Left eye exhibits no discharge. No scleral icterus.  Neck: Neck supple. No JVD present. No thyromegaly present.  Cardiovascular: Normal rate, regular rhythm, normal heart sounds and intact distal pulses. Exam reveals no gallop and no friction rub.  No murmur heard. Pulmonary/Chest: Effort normal. No respiratory distress. She has no wheezes. She has no rales.  Bilateral diminished air entry though not following instruction to take deep breaths.Respiration unlabored. Oxygen via nasal cannula in place.  Abdominal: Soft. Bowel sounds are normal. She exhibits no distension. There is no tenderness. There is no rebound and no guarding.  Musculoskeletal: She exhibits no edema or tenderness.  Unsteady gait uses FWW  Lymphadenopathy:    She has no cervical adenopathy.  Neurological: She is alert.  Confused at her baseline  Skin: Skin is warm and dry. No rash noted. No erythema.  Psychiatric:  confused    Labs reviewed: Recent Labs    11/17/16 0454  04/08/17 1506 04/08/17 2336  04/30/17 0940 05/15/17 1136 06/11/17  NA 140   < > 139 138   < > 142 140 145  145  K 3.6   < > 5.0 4.4   < > 4.5 4.7 4.3  4.3  CL 103  --  104 110  --   --   --   --   CO2 31   < > 28 22  --  29 27  --   GLUCOSE 95   < > 157* 138*  --  92 93  --   BUN 10   < > 33* 26*   < > 31.3* 24.3 29*  29*  CREATININE 0.53   < > 0.78 0.55   < > 0.9 0.9 0.6  0.63  CALCIUM 8.5*   < > 8.9 7.7*   --  9.6 9.5 9.1   < > = values in this interval not  displayed.   Recent Labs    04/30/17 0940 05/15/17 1136 06/11/17  AST 19 16 14  14   ALT 11 12 8  8   ALKPHOS 81 75 76  76  BILITOT 0.30 0.22 0.3  PROT 7.4 7.5 6.7  ALBUMIN 3.4* 2.8* 3.2   Recent Labs    04/08/17 1506  04/11/17 0636  04/30/17 0940 05/15/17 1136 06/11/17 06/15/17  WBC 9.3   < > 3.8*   < > 7.1 9.1 13.1 9.2  NEUTROABS 8.3*  --   --   --  5.8 7.1*  --   --   HGB 10.9*   < > 9.7*   < > 12.6 11.8 12.4 11.7*  HCT 32.9*   < > 29.3*   < > 39.6 37.0 37.1 35*  MCV 100.3*   < > 102.1*  --  106.5* 105.1*  --   --   PLT 82*   < > 79*   < > 208 342  --  259   < > = values in this interval not displayed.   Lab Results  Component Value Date   TSH 1.90 04/20/2017   Lab Results  Component Value Date   HGBA1C 5.4 03/08/2015   Lab Results  Component Value Date   CHOL 235 (H) 03/06/2009   HDL 108.40 03/06/2009   LDLDIRECT 108.2 03/06/2009    Significant Diagnostic Results in last 30 days:  No results found.  Assessment/Plan 1. Dilantin level too low Recent dilantin level result was 5.7 (06/25/2017).Per facility Nurse patient refuses to take medication at times.will continue current dilantin.Encourage to take medication.continue to monitor.   2. Seizure disorder No seizure activity reported. Continue to encourage to take medication.continue on Dilantin 100 mg daily and 200 mg capsule at bedtime.continue to monitor.   Family/ staff Communication: Reviewed plan of care with patient and facility Nurse.   Labs/tests ordered: None   Ian Cavey C [place] Espina, NP

## 2017-06-29 DIAGNOSIS — R4789 Other speech disturbances: Secondary | ICD-10-CM | POA: Diagnosis not present

## 2017-06-29 DIAGNOSIS — R1311 Dysphagia, oral phase: Secondary | ICD-10-CM | POA: Diagnosis not present

## 2017-06-29 DIAGNOSIS — M818 Other osteoporosis without current pathological fracture: Secondary | ICD-10-CM | POA: Diagnosis not present

## 2017-06-29 DIAGNOSIS — M779 Enthesopathy, unspecified: Secondary | ICD-10-CM | POA: Diagnosis not present

## 2017-06-29 DIAGNOSIS — F0390 Unspecified dementia without behavioral disturbance: Secondary | ICD-10-CM | POA: Diagnosis not present

## 2017-07-01 ENCOUNTER — Encounter: Payer: Self-pay | Admitting: Internal Medicine

## 2017-07-01 ENCOUNTER — Non-Acute Institutional Stay (SKILLED_NURSING_FACILITY): Payer: Medicare Other | Admitting: Internal Medicine

## 2017-07-01 DIAGNOSIS — F0391 Unspecified dementia with behavioral disturbance: Secondary | ICD-10-CM | POA: Diagnosis not present

## 2017-07-01 DIAGNOSIS — R634 Abnormal weight loss: Secondary | ICD-10-CM | POA: Diagnosis not present

## 2017-07-01 DIAGNOSIS — Z7189 Other specified counseling: Secondary | ICD-10-CM | POA: Diagnosis not present

## 2017-07-01 DIAGNOSIS — Z79899 Other long term (current) drug therapy: Secondary | ICD-10-CM | POA: Diagnosis not present

## 2017-07-01 NOTE — Progress Notes (Signed)
Location:  [place] Room Number: 25 Place of Service:  SNF [phone]) Provider:  [name] MD  [name], MD  Patient Care Team: [name], MD as PCP - General (Internal Medicine) [name], [facility] [name], MD as Consulting Physician (Orthopedic Surgery) [name], MD as Consulting Physician (Physical Medicine and Rehabilitation) Melina Schools, MD as Consulting Physician (Orthopedic Surgery) [name], MD as Consulting Physician (Otolaryngology) [name], [name], NP as Nurse Practitioner (Family Medicine)  Extended Emergency Contact Information Primary Emergency Contact: [name] Address: [address]          [name] of [place] Phone: [phone] Relation: Son Secondary Emergency Contact: [name] [place] of [place] Phone: [phone] Mobile Phone: [phone] Relation: Daughter  Code Status:  DNR  Goals of care: Advanced Directive information Advanced Directives 07/01/2017  Does Patient Have a Medical Advance Directive? Yes  Type of Paramedic of [place];Living will;Out of facility DNR (pink MOST or yellow form)  Does patient want to make changes to medical advance directive? No - Patient declined  Copy of [place] in Chart? Yes  Pre-existing out of facility DNR order (yellow form or pink MOST form) Yellow form placed in chart (order not valid for inpatient use);Pink MOST form placed in chart (order not valid for inpatient use)     Chief Complaint  Patient presents with  . Acute Visit    Advanced care plan meeting    HPI:  Pt is a 84 y.o. female seen today for acute visit for goals of care discussion and family concerns. Patient is seen today with her son and daughter present. She is pleasantly confused. She has been losing weight and has lost abut 10 lbs in 30 days. She continues to refuse her medications and has episodes  of agitation and incidences where she is walking into other resident's room without assistive device, removing her oxygen. She is being followed by SLP for safe swallowing.   Past Medical History:  Diagnosis Date  . Anemia, unspecified 03/18/2011  . Anxiety state, unspecified 01/2011  . Blood in stool 06/15/2012  . Corns and callosities 07/01/2011  . Dementia   . Diverticulosis 02/2011  . Dizziness   . DJD (degenerative joint disease)   . Dysuria 06/17/2011  . Hemorrhoids 02/2011  . History of radiation therapy 03/10/17-04/22/17   left lung 60 Gy in 30 fractions  . Low back pain   . Lung cancer ([place])    left  . Mitral valve problem thickened   thickened  . Osteoarthritis of both hands 10/09/2015  . Osteoporosis 02/2011  . Other abnormal blood chemistry 0/30/2012  . Other acquired deformity of toe 12/16/2011  . Pain in joint, site unspecified 01/2012  . 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 Sacroiliitis, not elsewhere classified ([place]) 05/2011  . Seizures ([place])    Remotely  . Unspecified hypothyroidism 01/2011  . 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 Vitamin D deficiency 01/2011   Past Surgical History:  Procedure Laterality Date  . bletheroplasty    . CATARACT EXTRACTION W/ INTRAOCULAR LENS  IMPLANT, BILATERAL  2010  . CERVICAL LAMINECTOMY  2005  . CYSTOSCOPY WITH RETROGRADE PYELOGRAM, URETEROSCOPY AND STENT PLACEMENT Bilateral 11/16/2016   Procedure: CYSTOSCOPY WITH RETROGRADE PYELOGRAMS/CLOT EVACUATION;  Surgeon: Irine Seal, MD;  [facility];  Service: Urology;  Laterality: Bilateral;  . [place]  2013  right 2nd toe Dr. Mallie Mussel  . IVC FILTER PLACEMENT ([facility] HX)    . JOINT REPLACEMENT Bilateral 2002 Right, 1992 Left   knees  . TRANSURETHRAL RESECTION OF BLADDER TUMOR N/A 11/16/2016   Procedure: TRANSURETHRAL RESECTION OF BLADDER TUMOR (TURBT);  Surgeon: Irine Seal, MD;  [facility];  Service: Urology;   Laterality: N/A;    Allergies  Allergen Reactions  . Macrodantin [Nitrofurantoin]   . Morphine And Related Other (See Comments)    Altered mental state   . Sulfa Antibiotics Other (See Comments)    Reaction: unknown    Outpatient Encounter Medications as of 07/01/2017  Medication Sig  . acetaminophen (TYLENOL) 325 MG tablet Take 650 mg by mouth 2 (two) times daily as needed.   Marland Kitchen acetaminophen (TYLENOL) 325 MG tablet Take 650 mg by mouth 2 (two) times daily.  . Calcium Carbonate-Vitamin D3 (CALCIUM 600-D) 600-400 MG-UNIT TABS Take 1 tablet by mouth daily.  . Cholecalciferol (VITAMIN D3) 5000 units TABS Take 1 tablet by mouth daily.   . clonazePAM (KLONOPIN) 0.5 MG tablet Take 0.25 mg by mouth every 8 (eight) hours as needed for anxiety.   . donepezil (ARICEPT) 10 MG tablet Take 5 mg by mouth at bedtime.   . DULoxetine (CYMBALTA) 20 MG capsule Take 20 mg by mouth every other day.  Marland Kitchen guaiFENesin (MUCINEX) 600 MG 12 hr tablet Take 1 tablet (600 mg total) by mouth 2 (two) times daily.  Marland Kitchen ipratropium-albuterol (DUONEB) 0.5-2.5 (3) MG/3ML SOLN Take 3 mLs by nebulization every 6 (six) hours as needed.  . lactose free nutrition (BOOST) LIQD Take 237 mLs by mouth 3 (three) times daily as needed.   Marland Kitchen levothyroxine (SYNTHROID, LEVOTHROID) 100 MCG tablet Take 1 tablet (100 mcg total) by mouth daily before breakfast. For thyroid  . magnesium hydroxide (MILK OF MAGNESIA) 400 MG/5ML suspension Take 30 mLs by mouth daily as needed for mild constipation.  . memantine (NAMENDA) 10 MG tablet Take 10 mg by mouth 2 (two) times daily.  . mirtazapine (REMERON) 15 MG tablet Take 15 mg by mouth at bedtime.  . Multiple Vitamins-Minerals (THERA M PLUS PO) Take 1 tablet by mouth daily.   . OXYGEN Inhale 2 L/min into the lungs as needed. To keep O2 Sat >90%  . phenytoin (DILANTIN) 100 MG ER capsule Take 100 mg by mouth daily.   . phenytoin (DILANTIN) 200 MG ER capsule Take 200 mg by mouth at bedtime.  Marland Kitchen QUEtiapine  (SEROQUEL) 25 MG tablet Take 25 mg by mouth at bedtime. At 8 pm  . umeclidinium-vilanterol (ANORO ELLIPTA) 62.5-25 MCG/INH AEPB Inhale 1 puff into the lungs daily.  . [DISCONTINUED] alum & mag hydroxide-simeth (MINTOX) 200-200-20 MG/5ML suspension Take 30 mLs by mouth every 4 (four) hours as needed for indigestion or heartburn.   No facility-administered encounter medications on file as of 07/01/2017.     Review of Systems  Unable to perform ROS: Dementia  Constitutional: Positive for appetite change. Negative for fever.  HENT: Negative for congestion.   Respiratory: Negative for cough and shortness of breath.   Cardiovascular: Negative for chest pain.  Musculoskeletal: Positive for gait problem.  Neurological: Negative for headaches.  Psychiatric/Behavioral: Positive for behavioral problems and confusion.    Immunization History  Administered Date(s) Administered  . Influenza Split 02/18/2017  . Influenza Whole 03/17/2007, 02/16/2009, 01/30/2010, 02/16/2013  . Influenza-Unspecified 03/02/2014, 02/22/2015, 02/28/2016  . PPD Test 07/09/2010, 02/04/2015, 04/04/2015, 06/04/2015  . Pneumococcal Conjugate-13 01/13/2017  . Pneumococcal Polysaccharide-23 05/19/2005, 05/23/2013  .  Td 05/19/2005  . Tetanus 10/06/2012  . Zoster 05/19/2012   Pertinent  Health Maintenance Due  Topic Date Due  . DEXA SCAN  11/19/1994  . INFLUENZA VACCINE  Completed  . PNA vac Low Risk Adult  Completed   Fall Risk  05/25/2017 03/13/2017 02/18/2017 01/05/2017 10/09/2015  Falls in the past year? Yes No Yes No No  Number falls in past yr: 1 - 2 or more - -  Injury with Fall? Yes - No - -  Comment - - - - -  Risk Factor Category  - - High Fall Risk - -  Risk for fall due to : History of fall(s) - - - -  Follow up Falls evaluation completed - - - -   Functional Status Survey:    Vitals:   07/01/17 1411  BP: 103/60  Pulse: 88  Resp: 18  Temp: 98.1 F (36.7 C)  TempSrc: Oral  SpO2: 93%  Weight: 122 lb  11.2 oz (55.7 kg)  Height: 5\' 7"  (1.702 m)   Body mass index is 19.22 kg/m.   Wt Readings from Last 3 Encounters:  07/01/17 122 lb 11.2 oz (55.7 kg)  06/26/17 120 lb 12.8 oz (54.8 kg)  06/17/17 128 lb 6.4 oz (58.2 kg)   Physical Exam  Constitutional: No distress.  Thin built and frail  HENT:  Head: Normocephalic and atraumatic.  Eyes: Conjunctivae and EOM are normal.  Neck: Neck supple.  Cardiovascular: Normal rate and regular rhythm.  Pulmonary/Chest: Effort normal.  Poor air movement  Abdominal: Soft. Bowel sounds are normal.  Musculoskeletal:  Unsteady gait, able to move all 4 extremities  Lymphadenopathy:    She has no cervical adenopathy.  Neurological: She is alert.  Oriented only to self  Skin: Skin is warm and dry. She is not diaphoretic.    Labs reviewed: Recent Labs    11/17/16 0454  04/08/17 1506 04/08/17 2336  04/30/17 0940 05/15/17 1136 06/11/17  NA 140   < > 139 138   < > 142 140 145  145  K 3.6   < > 5.0 4.4   < > 4.5 4.7 4.3  4.3  CL 103  --  104 110  --   --   --   --   CO2 31   < > 28 22  --  29 27  --   GLUCOSE 95   < > 157* 138*  --  92 93  --   BUN 10   < > 33* 26*   < > 31.3* 24.3 29*  29*  CREATININE 0.53   < > 0.78 0.55   < > 0.9 0.9 0.6  0.63  CALCIUM 8.5*   < > 8.9 7.7*  --  9.6 9.5 9.1   < > = values in this interval not displayed.   Recent Labs    04/30/17 0940 05/15/17 1136 06/11/17  AST 19 16 14  14   ALT 11 12 8  8   ALKPHOS 81 75 76  76  BILITOT 0.30 0.22 0.3  PROT 7.4 7.5 6.7  ALBUMIN 3.4* 2.8* 3.2   Recent Labs    04/08/17 1506  04/11/17 0636  04/30/17 0940 05/15/17 1136 06/11/17 06/15/17  WBC 9.3   < > 3.8*   < > 7.1 9.1 13.1 9.2  NEUTROABS 8.3*  --   --   --  5.8 7.1*  --   --   HGB 10.9*   < > 9.7*   < >  12.6 11.8 12.4 11.7*  HCT 32.9*   < > 29.3*   < > 39.6 37.0 37.1 35*  MCV 100.3*   < > 102.1*  --  106.5* 105.1*  --   --   PLT 82*   < > 79*   < > 208 342  --  259   < > = values in this interval not  displayed.   Lab Results  Component Value Date   TSH 1.90 04/20/2017   Lab Results  Component Value Date   HGBA1C 5.4 03/08/2015   Lab Results  Component Value Date   CHOL 235 (H) 03/06/2009   HDL 108.40 03/06/2009   LDLDIRECT 108.2 03/06/2009    Significant Diagnostic Results in last 30 days:  No results found.  Assessment/Plan  Weight loss Continues to lose weight. Continue remeron that was started recently to help stimulate appetite. Wean off donepezil as this can contribute to weight loss.   Dementia with behavior disturbance Ongoing decline and further decline anticipated. Change donepezil to 5 mg every other day for 1 week and stop. Change memantine to 10 mg daily x 1 week and stop.   Polypharmacy To reduce pill burden, discontinue vitamin d, calcium-vitamin d supplement, thera M plus and mucinex for now.  Goals of care discussion Reviewed goals of care with patient's son and daughter who are both HCPOA present. Social worker also present. Patient is a DNR. At present family would like limited intervention with hospitalization. On further discussion, explained her poor prognosis and transfer to the hospital might night provide quality of life for patient. Family understands this and agrees with it. They would like for patient to be comfortable and avoid transfers to different places. They would like to continue with oncology service appointment. They are willing for palliative care consult to discuss further. Iv fluids and antibiotic if indicated. They would like pill burden reduced. They have question about hospice and if this service is involved, does pt have to be transferred to [facility] place. Reassured that if need for hospice arises, pt will be provided this service at the facility. Family understands this. A blank MOST form has been provided for family to review. Palliative care service provided. Several medication changed as above. Spent time from 1:45 pm to 2:15 pm  reviewing goals of care.   Family/ staff Communication: reviewed care plan with patient's son and daughter/HCPOA.   Labs/tests ordered:  Palliative care consult   [name], MD Internal Medicine [place], [place] [address] Cell Phone (Monday-Friday 8 am - 5 pm): [phone] On Call: [phone] and follow prompts after 5 pm and on weekends Office Phone: [phone] Office Fax: [phone]

## 2017-07-03 DIAGNOSIS — R4789 Other speech disturbances: Secondary | ICD-10-CM | POA: Diagnosis not present

## 2017-07-03 DIAGNOSIS — M779 Enthesopathy, unspecified: Secondary | ICD-10-CM | POA: Diagnosis not present

## 2017-07-03 DIAGNOSIS — M818 Other osteoporosis without current pathological fracture: Secondary | ICD-10-CM | POA: Diagnosis not present

## 2017-07-03 DIAGNOSIS — R1311 Dysphagia, oral phase: Secondary | ICD-10-CM | POA: Diagnosis not present

## 2017-07-03 DIAGNOSIS — F0390 Unspecified dementia without behavioral disturbance: Secondary | ICD-10-CM | POA: Diagnosis not present

## 2017-07-04 DIAGNOSIS — R1311 Dysphagia, oral phase: Secondary | ICD-10-CM | POA: Diagnosis not present

## 2017-07-04 DIAGNOSIS — R4789 Other speech disturbances: Secondary | ICD-10-CM | POA: Diagnosis not present

## 2017-07-04 DIAGNOSIS — M779 Enthesopathy, unspecified: Secondary | ICD-10-CM | POA: Diagnosis not present

## 2017-07-04 DIAGNOSIS — M818 Other osteoporosis without current pathological fracture: Secondary | ICD-10-CM | POA: Diagnosis not present

## 2017-07-04 DIAGNOSIS — F0390 Unspecified dementia without behavioral disturbance: Secondary | ICD-10-CM | POA: Diagnosis not present

## 2017-07-06 DIAGNOSIS — R531 Weakness: Secondary | ICD-10-CM | POA: Diagnosis not present

## 2017-07-07 DIAGNOSIS — R4789 Other speech disturbances: Secondary | ICD-10-CM | POA: Diagnosis not present

## 2017-07-07 DIAGNOSIS — M779 Enthesopathy, unspecified: Secondary | ICD-10-CM | POA: Diagnosis not present

## 2017-07-07 DIAGNOSIS — R1311 Dysphagia, oral phase: Secondary | ICD-10-CM | POA: Diagnosis not present

## 2017-07-07 DIAGNOSIS — M818 Other osteoporosis without current pathological fracture: Secondary | ICD-10-CM | POA: Diagnosis not present

## 2017-07-07 DIAGNOSIS — F0390 Unspecified dementia without behavioral disturbance: Secondary | ICD-10-CM | POA: Diagnosis not present

## 2017-07-08 DIAGNOSIS — M818 Other osteoporosis without current pathological fracture: Secondary | ICD-10-CM | POA: Diagnosis not present

## 2017-07-08 DIAGNOSIS — F0390 Unspecified dementia without behavioral disturbance: Secondary | ICD-10-CM | POA: Diagnosis not present

## 2017-07-08 DIAGNOSIS — R4789 Other speech disturbances: Secondary | ICD-10-CM | POA: Diagnosis not present

## 2017-07-08 DIAGNOSIS — R1311 Dysphagia, oral phase: Secondary | ICD-10-CM | POA: Diagnosis not present

## 2017-07-08 DIAGNOSIS — M779 Enthesopathy, unspecified: Secondary | ICD-10-CM | POA: Diagnosis not present

## 2017-07-09 DIAGNOSIS — M779 Enthesopathy, unspecified: Secondary | ICD-10-CM | POA: Diagnosis not present

## 2017-07-09 DIAGNOSIS — F0390 Unspecified dementia without behavioral disturbance: Secondary | ICD-10-CM | POA: Diagnosis not present

## 2017-07-09 DIAGNOSIS — R1311 Dysphagia, oral phase: Secondary | ICD-10-CM | POA: Diagnosis not present

## 2017-07-09 DIAGNOSIS — M818 Other osteoporosis without current pathological fracture: Secondary | ICD-10-CM | POA: Diagnosis not present

## 2017-07-09 DIAGNOSIS — R4789 Other speech disturbances: Secondary | ICD-10-CM | POA: Diagnosis not present

## 2017-07-10 DIAGNOSIS — M818 Other osteoporosis without current pathological fracture: Secondary | ICD-10-CM | POA: Diagnosis not present

## 2017-07-10 DIAGNOSIS — R4789 Other speech disturbances: Secondary | ICD-10-CM | POA: Diagnosis not present

## 2017-07-10 DIAGNOSIS — F0390 Unspecified dementia without behavioral disturbance: Secondary | ICD-10-CM | POA: Diagnosis not present

## 2017-07-10 DIAGNOSIS — M779 Enthesopathy, unspecified: Secondary | ICD-10-CM | POA: Diagnosis not present

## 2017-07-10 DIAGNOSIS — R1311 Dysphagia, oral phase: Secondary | ICD-10-CM | POA: Diagnosis not present

## 2017-07-13 DIAGNOSIS — M818 Other osteoporosis without current pathological fracture: Secondary | ICD-10-CM | POA: Diagnosis not present

## 2017-07-13 DIAGNOSIS — R1311 Dysphagia, oral phase: Secondary | ICD-10-CM | POA: Diagnosis not present

## 2017-07-13 DIAGNOSIS — F0390 Unspecified dementia without behavioral disturbance: Secondary | ICD-10-CM | POA: Diagnosis not present

## 2017-07-13 DIAGNOSIS — R4789 Other speech disturbances: Secondary | ICD-10-CM | POA: Diagnosis not present

## 2017-07-13 DIAGNOSIS — M779 Enthesopathy, unspecified: Secondary | ICD-10-CM | POA: Diagnosis not present

## 2017-07-14 DIAGNOSIS — F0390 Unspecified dementia without behavioral disturbance: Secondary | ICD-10-CM | POA: Diagnosis not present

## 2017-07-14 DIAGNOSIS — R1311 Dysphagia, oral phase: Secondary | ICD-10-CM | POA: Diagnosis not present

## 2017-07-14 DIAGNOSIS — R4789 Other speech disturbances: Secondary | ICD-10-CM | POA: Diagnosis not present

## 2017-07-14 DIAGNOSIS — M779 Enthesopathy, unspecified: Secondary | ICD-10-CM | POA: Diagnosis not present

## 2017-07-14 DIAGNOSIS — M818 Other osteoporosis without current pathological fracture: Secondary | ICD-10-CM | POA: Diagnosis not present

## 2017-07-15 ENCOUNTER — Non-Acute Institutional Stay (SKILLED_NURSING_FACILITY): Payer: Medicare Other | Admitting: Family

## 2017-07-15 ENCOUNTER — Encounter: Payer: Self-pay | Admitting: Family

## 2017-07-15 DIAGNOSIS — M150 Primary generalized (osteo)arthritis: Secondary | ICD-10-CM | POA: Diagnosis not present

## 2017-07-15 DIAGNOSIS — J9611 Chronic respiratory failure with hypoxia: Secondary | ICD-10-CM | POA: Diagnosis not present

## 2017-07-15 DIAGNOSIS — F0390 Unspecified dementia without behavioral disturbance: Secondary | ICD-10-CM | POA: Diagnosis not present

## 2017-07-15 DIAGNOSIS — R1311 Dysphagia, oral phase: Secondary | ICD-10-CM | POA: Diagnosis not present

## 2017-07-15 DIAGNOSIS — E43 Unspecified severe protein-calorie malnutrition: Secondary | ICD-10-CM | POA: Diagnosis not present

## 2017-07-15 DIAGNOSIS — M818 Other osteoporosis without current pathological fracture: Secondary | ICD-10-CM | POA: Diagnosis not present

## 2017-07-15 DIAGNOSIS — M159 Polyosteoarthritis, unspecified: Secondary | ICD-10-CM

## 2017-07-15 DIAGNOSIS — R4789 Other speech disturbances: Secondary | ICD-10-CM | POA: Diagnosis not present

## 2017-07-15 DIAGNOSIS — M779 Enthesopathy, unspecified: Secondary | ICD-10-CM | POA: Diagnosis not present

## 2017-07-15 DIAGNOSIS — E039 Hypothyroidism, unspecified: Secondary | ICD-10-CM | POA: Diagnosis not present

## 2017-07-15 NOTE — Progress Notes (Signed)
Location:  [place] Room Number: 25 Place of Service:  SNF (31) Provider: March Steyer FNP-C   [name], MD  Patient Care Team: [name], MD as PCP - General (Internal Medicine) [name], [facility] [name], MD as Consulting Physician (Orthopedic Surgery) [name], MD as Consulting Physician (Physical Medicine and Rehabilitation) Melina Schools, MD as Consulting Physician (Orthopedic Surgery) [name], MD as Consulting Physician (Otolaryngology) Georgeann Brinkman, [name], NP as Nurse Practitioner (Family Medicine)  Extended Emergency Contact Information Primary Emergency Contact: [name] Address: [address]          [name] of [place] Phone: [phone] Relation: Son Secondary Emergency Contact: [name] [place] of [place] Phone: [phone] Mobile Phone: [phone] Relation: Daughter  Code Status:DNR Goals of care: Advanced Directive information Advanced Directives 07/15/2017  Does Patient Have a Medical Advance Directive? Yes  Type of Paramedic of [place];Living will;Out of facility DNR (pink MOST or yellow form)  Does patient want to make changes to medical advance directive? No - Patient declined  Copy of [place] in Chart? Yes  Pre-existing out of facility DNR order (yellow form or pink MOST form) Yellow form placed in chart (order not valid for inpatient use);Pink MOST form placed in chart (order not valid for inpatient use)     Chief Complaint  Patient presents with  . Medical Management of Chronic Issues    Routine Visit     HPI:  Pt is a 84 y.o. female seen today [place] for medical management of chronic diseases.she is seen in her room today.Facility Nurse reports patient attends facility activities but distractive at times.she continues to wander and walks without her walker and oxygen at times.she is  co-operative during visit but unable to provide HPI and ROS information during visit.she was evaluated by Registered Dietician and started on SF Prostat three times daily.    Past Medical History:  Diagnosis Date  . Anemia, unspecified 03/18/2011  . Anxiety state, unspecified 01/2011  . Blood in stool 06/15/2012  . Corns and callosities 07/01/2011  . Dementia   . Diverticulosis 02/2011  . Dizziness   . DJD (degenerative joint disease)   . Dysuria 06/17/2011  . Hemorrhoids 02/2011  . History of radiation therapy 03/10/17-04/22/17   left lung 60 Gy in 30 fractions  . Low back pain   . Lung cancer ([place])    left  . Mitral valve problem thickened   thickened  . Osteoarthritis of both hands 10/09/2015  . Osteoporosis 02/2011  . Other abnormal blood chemistry 0/30/2012  . Other acquired deformity of toe 12/16/2011  . Pain in joint, site unspecified 01/2012  . 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 Sacroiliitis, not elsewhere classified ([place]) 05/2011  . Seizures ([place])    Remotely  . Unspecified hypothyroidism 01/2011  . 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 Vitamin D deficiency 01/2011   Past Surgical History:  Procedure Laterality Date  . bletheroplasty    . CATARACT EXTRACTION W/ INTRAOCULAR LENS  IMPLANT, BILATERAL  2010  . CERVICAL LAMINECTOMY  2005  . CYSTOSCOPY WITH RETROGRADE PYELOGRAM, URETEROSCOPY AND STENT PLACEMENT Bilateral 11/16/2016   Procedure: CYSTOSCOPY WITH RETROGRADE PYELOGRAMS/CLOT EVACUATION;  Surgeon: Irine Seal, MD;  Location: [facility];  Service: Urology;  Laterality: Bilateral;  . HAMMER TOE SURGERY  2013   right 2nd toe Dr. Mallie Mussel  . IVC FILTER PLACEMENT (  [place] HX)    . JOINT REPLACEMENT Bilateral 2002 Right, 1992 Left   knees  . TRANSURETHRAL RESECTION OF BLADDER TUMOR N/A 11/16/2016   Procedure: TRANSURETHRAL RESECTION OF BLADDER TUMOR (TURBT);  Surgeon: Irine Seal, MD;  [facility];  Service: Urology;   Laterality: N/A;    Allergies  Allergen Reactions  . Macrodantin [Nitrofurantoin]   . Morphine And Related Other (See Comments)    Altered mental state   . Sulfa Antibiotics Other (See Comments)    Reaction: unknown    Allergies as of 07/15/2017      Reactions   Macrodantin [nitrofurantoin]    Morphine And Related Other (See Comments)   Altered mental state    Sulfa Antibiotics Other (See Comments)   Reaction: unknown      Medication List        Accurate as of 07/15/17 10:28 PM. Always use your most recent med list.          acetaminophen 325 MG tablet Commonly known as:  TYLENOL Take 650 mg by mouth 2 (two) times daily as needed.   acetaminophen 325 MG tablet Commonly known as:  TYLENOL Take 650 mg by mouth 2 (two) times daily.   clonazePAM 0.5 MG tablet Commonly known as:  KLONOPIN Take 0.25 mg by mouth every 8 (eight) hours as needed for anxiety.   feeding supplement (PRO-STAT SUGAR FREE 64) Liqd Take 30 mLs by mouth 3 (three) times daily with meals.   ipratropium-albuterol 0.5-2.5 (3) MG/3ML Soln Commonly known as:  DUONEB Take 3 mLs by nebulization every 6 (six) hours as needed.   lactose free nutrition Liqd Take 237 mLs by mouth 3 (three) times daily as needed.   levothyroxine 100 MCG tablet Commonly known as:  SYNTHROID, LEVOTHROID Take 1 tablet (100 mcg total) by mouth daily before breakfast. For thyroid   magnesium hydroxide 400 MG/5ML suspension Commonly known as:  MILK OF MAGNESIA Take 30 mLs by mouth daily as needed for mild constipation.   mirtazapine 15 MG tablet Commonly known as:  REMERON Take 15 mg by mouth at bedtime.   OXYGEN Inhale 2 L/min into the lungs as needed. To keep O2 Sat >90%   phenytoin 100 MG ER capsule Commonly known as:  DILANTIN Take 100 mg by mouth daily.   phenytoin 200 MG ER capsule Commonly known as:  DILANTIN Take 200 mg by mouth at bedtime.   QUEtiapine 25 MG tablet Commonly known as:  SEROQUEL Take 25  mg by mouth at bedtime. At 8 pm   umeclidinium-vilanterol 62.5-25 MCG/INH Aepb Commonly known as:  ANORO ELLIPTA Inhale 1 puff into the lungs daily.       Review of Systems  Unable to perform ROS: Dementia (additional information provided by facility nurse )    Immunization History  Administered Date(s) Administered  . Influenza Split 02/18/2017  . Influenza Whole 03/17/2007, 02/16/2009, 01/30/2010, 02/16/2013  . Influenza-Unspecified 03/02/2014, 02/22/2015, 02/28/2016  . PPD Test 07/09/2010, 02/04/2015, 04/04/2015, 06/04/2015  . Pneumococcal Conjugate-13 01/13/2017  . Pneumococcal Polysaccharide-23 05/19/2005, 05/23/2013  . Td 05/19/2005  . Tetanus 10/06/2012  . Zoster 05/19/2012   Pertinent  Health Maintenance Due  Topic Date Due  . DEXA SCAN  11/19/1994  . INFLUENZA VACCINE  Completed  . PNA vac Low Risk Adult  Completed   Fall Risk  05/25/2017 03/13/2017 02/18/2017 01/05/2017 10/09/2015  Falls in the past year? Yes No Yes No No  Number falls in past yr: 1 - 2 or more - -  Injury with Fall? Yes - No - -  Comment - - - - -  Risk Factor Category  - - High Fall Risk - -  Risk for fall due to : History of fall(s) - - - -  Follow up Falls evaluation completed - - - -   Functional Status Survey:    Vitals:   07/15/17 1228  BP: 122/64  Pulse: 76  Resp: 18  Temp: (!) 97.2 F (36.2 C)  TempSrc: Oral  SpO2: 93%  Weight: 122 lb 11.2 oz (55.7 kg)  Height: 5\' 7"  (1.702 m)   Body mass index is 19.22 kg/m. Physical Exam  Constitutional:  Thin frail elderly in no acute distress.Vibrant during visit.  HENT:  Head: Normocephalic.  Right Ear: External ear normal.  Left Ear: External ear normal.  Mouth/Throat: Oropharynx is clear and moist. No oropharyngeal exudate.  Eyes: Conjunctivae and EOM are normal. Pupils are equal, round, and reactive to light. Right eye exhibits no discharge. Left eye exhibits no discharge. No scleral icterus.  Eye glasses in place.  Neck: Normal  range of motion. No JVD present. No thyromegaly present.  Cardiovascular: Intact distal pulses. Exam reveals no gallop and no friction rub.  Murmur heard. Pulmonary/Chest: Effort normal and breath sounds normal. No respiratory distress. She has no wheezes. She has no rales.  Oxygen 2 liters via nasal cannula in place  Abdominal: Soft. Bowel sounds are normal. She exhibits no distension. There is no tenderness. There is no rebound and no guarding.  Musculoskeletal: She exhibits no edema or tenderness.  Moves x 4 extremities.unsteady gait uses FWW  Lymphadenopathy:    She has no cervical adenopathy.  Neurological: She is alert. Coordination normal.  Pleasantly confused at her baseline  Skin: Skin is warm and dry. No rash noted. No erythema. No pallor.  Psychiatric: She has a normal mood and affect. Cognition and memory are impaired.    Labs reviewed: Recent Labs    11/17/16 0454  04/08/17 1506 04/08/17 2336  04/30/17 0940 05/15/17 1136 06/11/17  NA 140   < > 139 138   < > 142 140 145  145  K 3.6   < > 5.0 4.4   < > 4.5 4.7 4.3  4.3  CL 103  --  104 110  --   --   --   --   CO2 31   < > 28 22  --  29 27  --   GLUCOSE 95   < > 157* 138*  --  92 93  --   BUN 10   < > 33* 26*   < > 31.3* 24.3 29*  29*  CREATININE 0.53   < > 0.78 0.55   < > 0.9 0.9 0.6  0.63  CALCIUM 8.5*   < > 8.9 7.7*  --  9.6 9.5 9.1   < > = values in this interval not displayed.   Recent Labs    04/30/17 0940 05/15/17 1136 06/11/17  AST 19 16 14  14   ALT 11 12 8  8   ALKPHOS 81 75 76  76  BILITOT 0.30 0.22 0.3  PROT 7.4 7.5 6.7  ALBUMIN 3.4* 2.8* 3.2   Recent Labs    04/08/17 1506  04/11/17 0636  04/30/17 0940 05/15/17 1136 06/11/17 06/15/17  WBC 9.3   < > 3.8*   < > 7.1 9.1 13.1 9.2  NEUTROABS 8.3*  --   --   --  5.8 7.1*  --   --  HGB 10.9*   < > 9.7*   < > 12.6 11.8 12.4 11.7*  HCT 32.9*   < > 29.3*   < > 39.6 37.0 37.1 35*  MCV 100.3*   < > 102.1*  --  106.5* 105.1*  --   --   PLT 82*    < > 79*   < > 208 342  --  259   < > = values in this interval not displayed.   Lab Results  Component Value Date   TSH 1.90 04/20/2017   Lab Results  Component Value Date   HGBA1C 5.4 03/08/2015   Lab Results  Component Value Date   CHOL 235 (H) 03/06/2009   HDL 108.40 03/06/2009   LDLDIRECT 108.2 03/06/2009    Significant Diagnostic Results in last 30 days:  No results found.  Assessment/Plan 1. Hypothyroidism Lab Results  Component Value Date   TSH 1.90 04/20/2017  Continue on levothyroxine 100 mcg tablet daily.monitor TSH level.   2. Chronic respiratory failure with hypoxia Breathing stable.continue on DuoNeb every 6 hours as needed and Anoro Ellipta one puff daily.continue on oxygen 2 Liters via nasal cannula.continue to follow up with palliative care.   3. Primary osteoarthritis involving multiple joints Continue on Tylenol twice daily.continue to encourage to participate in facility exercises and activities.  4. Protein-calorie malnutrition SF Prostat three times daily initiated by Registered Dietician.continue to monitor weight.   Family/ staff Communication: Reviewed plan of care with patient and facility Nurse  Labs/tests ordered: None   [name] Lindsey Hommel, NP

## 2017-07-16 DIAGNOSIS — M818 Other osteoporosis without current pathological fracture: Secondary | ICD-10-CM | POA: Diagnosis not present

## 2017-07-16 DIAGNOSIS — F0390 Unspecified dementia without behavioral disturbance: Secondary | ICD-10-CM | POA: Diagnosis not present

## 2017-07-16 DIAGNOSIS — M779 Enthesopathy, unspecified: Secondary | ICD-10-CM | POA: Diagnosis not present

## 2017-07-16 DIAGNOSIS — R4789 Other speech disturbances: Secondary | ICD-10-CM | POA: Diagnosis not present

## 2017-07-16 DIAGNOSIS — R1311 Dysphagia, oral phase: Secondary | ICD-10-CM | POA: Diagnosis not present

## 2017-07-17 DIAGNOSIS — R4789 Other speech disturbances: Secondary | ICD-10-CM | POA: Diagnosis not present

## 2017-07-17 DIAGNOSIS — F0390 Unspecified dementia without behavioral disturbance: Secondary | ICD-10-CM | POA: Diagnosis not present

## 2017-07-17 DIAGNOSIS — R1311 Dysphagia, oral phase: Secondary | ICD-10-CM | POA: Diagnosis not present

## 2017-07-21 DIAGNOSIS — R1311 Dysphagia, oral phase: Secondary | ICD-10-CM | POA: Diagnosis not present

## 2017-07-21 DIAGNOSIS — R4789 Other speech disturbances: Secondary | ICD-10-CM | POA: Diagnosis not present

## 2017-07-21 DIAGNOSIS — F0390 Unspecified dementia without behavioral disturbance: Secondary | ICD-10-CM | POA: Diagnosis not present

## 2017-07-22 DIAGNOSIS — R4789 Other speech disturbances: Secondary | ICD-10-CM | POA: Diagnosis not present

## 2017-07-22 DIAGNOSIS — R1311 Dysphagia, oral phase: Secondary | ICD-10-CM | POA: Diagnosis not present

## 2017-07-22 DIAGNOSIS — F0390 Unspecified dementia without behavioral disturbance: Secondary | ICD-10-CM | POA: Diagnosis not present

## 2017-07-23 DIAGNOSIS — R1311 Dysphagia, oral phase: Secondary | ICD-10-CM | POA: Diagnosis not present

## 2017-07-23 DIAGNOSIS — R4789 Other speech disturbances: Secondary | ICD-10-CM | POA: Diagnosis not present

## 2017-07-23 DIAGNOSIS — F0390 Unspecified dementia without behavioral disturbance: Secondary | ICD-10-CM | POA: Diagnosis not present

## 2017-07-23 DIAGNOSIS — R531 Weakness: Secondary | ICD-10-CM | POA: Diagnosis not present

## 2017-07-24 DIAGNOSIS — R4789 Other speech disturbances: Secondary | ICD-10-CM | POA: Diagnosis not present

## 2017-07-24 DIAGNOSIS — R1311 Dysphagia, oral phase: Secondary | ICD-10-CM | POA: Diagnosis not present

## 2017-07-24 DIAGNOSIS — F0390 Unspecified dementia without behavioral disturbance: Secondary | ICD-10-CM | POA: Diagnosis not present

## 2017-07-27 DIAGNOSIS — R1311 Dysphagia, oral phase: Secondary | ICD-10-CM | POA: Diagnosis not present

## 2017-07-27 DIAGNOSIS — F0390 Unspecified dementia without behavioral disturbance: Secondary | ICD-10-CM | POA: Diagnosis not present

## 2017-07-27 DIAGNOSIS — R4789 Other speech disturbances: Secondary | ICD-10-CM | POA: Diagnosis not present

## 2017-07-28 DIAGNOSIS — F0390 Unspecified dementia without behavioral disturbance: Secondary | ICD-10-CM | POA: Diagnosis not present

## 2017-07-28 DIAGNOSIS — R1311 Dysphagia, oral phase: Secondary | ICD-10-CM | POA: Diagnosis not present

## 2017-07-28 DIAGNOSIS — R4789 Other speech disturbances: Secondary | ICD-10-CM | POA: Diagnosis not present

## 2017-07-30 DIAGNOSIS — F0390 Unspecified dementia without behavioral disturbance: Secondary | ICD-10-CM | POA: Diagnosis not present

## 2017-07-30 DIAGNOSIS — R1311 Dysphagia, oral phase: Secondary | ICD-10-CM | POA: Diagnosis not present

## 2017-07-30 DIAGNOSIS — R4789 Other speech disturbances: Secondary | ICD-10-CM | POA: Diagnosis not present

## 2017-07-31 DIAGNOSIS — R4789 Other speech disturbances: Secondary | ICD-10-CM | POA: Diagnosis not present

## 2017-07-31 DIAGNOSIS — R1311 Dysphagia, oral phase: Secondary | ICD-10-CM | POA: Diagnosis not present

## 2017-07-31 DIAGNOSIS — F0390 Unspecified dementia without behavioral disturbance: Secondary | ICD-10-CM | POA: Diagnosis not present

## 2017-08-04 DIAGNOSIS — R4789 Other speech disturbances: Secondary | ICD-10-CM | POA: Diagnosis not present

## 2017-08-04 DIAGNOSIS — F0390 Unspecified dementia without behavioral disturbance: Secondary | ICD-10-CM | POA: Diagnosis not present

## 2017-08-04 DIAGNOSIS — R1311 Dysphagia, oral phase: Secondary | ICD-10-CM | POA: Diagnosis not present

## 2017-08-05 DIAGNOSIS — R4789 Other speech disturbances: Secondary | ICD-10-CM | POA: Diagnosis not present

## 2017-08-05 DIAGNOSIS — F0390 Unspecified dementia without behavioral disturbance: Secondary | ICD-10-CM | POA: Diagnosis not present

## 2017-08-05 DIAGNOSIS — R1311 Dysphagia, oral phase: Secondary | ICD-10-CM | POA: Diagnosis not present

## 2017-08-06 DIAGNOSIS — F0390 Unspecified dementia without behavioral disturbance: Secondary | ICD-10-CM | POA: Diagnosis not present

## 2017-08-06 DIAGNOSIS — R4789 Other speech disturbances: Secondary | ICD-10-CM | POA: Diagnosis not present

## 2017-08-06 DIAGNOSIS — R1311 Dysphagia, oral phase: Secondary | ICD-10-CM | POA: Diagnosis not present

## 2017-08-07 DIAGNOSIS — R1311 Dysphagia, oral phase: Secondary | ICD-10-CM | POA: Diagnosis not present

## 2017-08-07 DIAGNOSIS — R4789 Other speech disturbances: Secondary | ICD-10-CM | POA: Diagnosis not present

## 2017-08-07 DIAGNOSIS — F0390 Unspecified dementia without behavioral disturbance: Secondary | ICD-10-CM | POA: Diagnosis not present

## 2017-08-10 DIAGNOSIS — R4789 Other speech disturbances: Secondary | ICD-10-CM | POA: Diagnosis not present

## 2017-08-10 DIAGNOSIS — R1311 Dysphagia, oral phase: Secondary | ICD-10-CM | POA: Diagnosis not present

## 2017-08-10 DIAGNOSIS — F0390 Unspecified dementia without behavioral disturbance: Secondary | ICD-10-CM | POA: Diagnosis not present

## 2017-08-12 ENCOUNTER — Non-Acute Institutional Stay (SKILLED_NURSING_FACILITY): Payer: Medicare Other | Admitting: Family

## 2017-08-12 ENCOUNTER — Encounter: Payer: Self-pay | Admitting: Family

## 2017-08-12 DIAGNOSIS — E039 Hypothyroidism, unspecified: Secondary | ICD-10-CM

## 2017-08-12 DIAGNOSIS — R1311 Dysphagia, oral phase: Secondary | ICD-10-CM | POA: Diagnosis not present

## 2017-08-12 DIAGNOSIS — F0391 Unspecified dementia with behavioral disturbance: Secondary | ICD-10-CM

## 2017-08-12 DIAGNOSIS — G40909 Epilepsy, unspecified, not intractable, without status epilepticus: Secondary | ICD-10-CM | POA: Diagnosis not present

## 2017-08-12 DIAGNOSIS — R634 Abnormal weight loss: Secondary | ICD-10-CM | POA: Diagnosis not present

## 2017-08-12 DIAGNOSIS — R4789 Other speech disturbances: Secondary | ICD-10-CM | POA: Diagnosis not present

## 2017-08-12 DIAGNOSIS — F0390 Unspecified dementia without behavioral disturbance: Secondary | ICD-10-CM | POA: Diagnosis not present

## 2017-08-13 DIAGNOSIS — R1311 Dysphagia, oral phase: Secondary | ICD-10-CM | POA: Diagnosis not present

## 2017-08-13 DIAGNOSIS — R4789 Other speech disturbances: Secondary | ICD-10-CM | POA: Diagnosis not present

## 2017-08-13 DIAGNOSIS — F0390 Unspecified dementia without behavioral disturbance: Secondary | ICD-10-CM | POA: Diagnosis not present

## 2017-08-14 DIAGNOSIS — F0390 Unspecified dementia without behavioral disturbance: Secondary | ICD-10-CM | POA: Diagnosis not present

## 2017-08-14 DIAGNOSIS — R1311 Dysphagia, oral phase: Secondary | ICD-10-CM | POA: Diagnosis not present

## 2017-08-14 DIAGNOSIS — R4789 Other speech disturbances: Secondary | ICD-10-CM | POA: Diagnosis not present

## 2017-08-17 NOTE — Progress Notes (Signed)
Location:  [place] Room Number: 25 Place of Service:  SNF (31) Provider: Cymone Yeske FNP-C   [name], MD  Patient Care Team: [name], MD as PCP - General (Internal Medicine) [name], [facility] [name], MD as Consulting Physician (Orthopedic Surgery) [name], MD as Consulting Physician (Physical Medicine and Rehabilitation) Melina Schools, MD as Consulting Physician (Orthopedic Surgery) [name], MD as Consulting Physician (Otolaryngology) Cheyenne Schumm, [name], NP as Nurse Practitioner (Family Medicine)  Extended Emergency Contact Information Primary Emergency Contact: [name] Address: [address]          [name] of [place] Phone: [phone] Relation: Son Secondary Emergency Contact: [name] [place] of [place] Phone: [phone] Mobile Phone: [phone] Relation: Daughter  Code Status: DNR Goals of care: Advanced Directive information Advanced Directives 08/12/2017  Does Patient Have a Medical Advance Directive? Yes  Type of Paramedic of [place];Living will;Out of facility DNR (pink MOST or yellow form)  Does patient want to make changes to medical advance directive? No - Patient declined  Copy of [place] in Chart? Yes  Pre-existing out of facility DNR order (yellow form or pink MOST form) Yellow form placed in chart (order not valid for inpatient use);Pink MOST form placed in chart (order not valid for inpatient use)     Chief Complaint  Patient presents with  . Medical Management of Chronic Issues    Routine Visit     HPI:  Pt is a 84 y.o. female seen today [place] for medical management of chronic diseases.She has a medical history of hypothyroidism,Seizure disorder,chronic respiratory failure with hypoxia on oxygen,NSCLC,GERD,Dementia,depression among other conditions.she is seen in her room today.she  denies any acute issues during visit though her HPI and ROS is limited due to her dementia.Facility Nurse states patient wandered off the facility door " was trying to sit on a bench outside the facility".Wander guard in place.Patient was assisted back to the facility. No recent fall episode reported. Her weight log reviewed.Weight loss noted : Wt 128.4 lbs ( 06/05/2017); Wt 132.1 lbs ( 06/17/2017);Wt 122.7 lbs (07/01/2017) and Wt 113.5 lbs ( 07/29/2017).she eats 25 % of her meals.currently on Remeron 15 mg tablet at bedtime for appetite.she is also on protein supplements and continues to be followed by facility Registered dietician.   Past Medical History:  Diagnosis Date  . Anemia, unspecified 03/18/2011  . Anxiety state, unspecified 01/2011  . Blood in stool 06/15/2012  . Corns and callosities 07/01/2011  . Dementia   . Diverticulosis 02/2011  . Dizziness   . DJD (degenerative joint disease)   . Dysuria 06/17/2011  . Hemorrhoids 02/2011  . History of radiation therapy 03/10/17-04/22/17   left lung 60 Gy in 30 fractions  . Low back pain   . Lung cancer ([place])    left  . Mitral valve problem thickened   thickened  . Osteoarthritis of both hands 10/09/2015  . Osteoporosis 02/2011  . Other abnormal blood chemistry 0/30/2012  . Other acquired deformity of toe 12/16/2011  . Pain in joint, site unspecified 01/2012  . 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 Sacroiliitis, not elsewhere classified ([place]) 05/2011  . Seizures ([place])    Remotely  . Unspecified hypothyroidism 01/2011  . 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 Vitamin D deficiency 01/2011   Past Surgical History:  Procedure Laterality Date  . bletheroplasty    .  CATARACT EXTRACTION W/ INTRAOCULAR LENS  IMPLANT, BILATERAL  2010  . CERVICAL LAMINECTOMY  2005  . CYSTOSCOPY WITH RETROGRADE PYELOGRAM, URETEROSCOPY AND STENT PLACEMENT Bilateral 11/16/2016   Procedure: CYSTOSCOPY WITH RETROGRADE  PYELOGRAMS/CLOT EVACUATION;  Surgeon: Irine Seal, MD;  [facility];  Service: Urology;  Laterality: Bilateral;  . HAMMER TOE SURGERY  2013   right 2nd toe Dr. Mallie Mussel  . IVC FILTER PLACEMENT ([facility] HX)    . JOINT REPLACEMENT Bilateral 2002 Right, 1992 Left   knees  . TRANSURETHRAL RESECTION OF BLADDER TUMOR N/A 11/16/2016   Procedure: TRANSURETHRAL RESECTION OF BLADDER TUMOR (TURBT);  Surgeon: Irine Seal, MD;  [facility];  Service: Urology;  Laterality: N/A;    Allergies  Allergen Reactions  . Macrodantin [Nitrofurantoin]   . Morphine And Related Other (See Comments)    Altered mental state   . Sulfa Antibiotics Other (See Comments)    Reaction: unknown    Allergies as of 08/12/2017      Reactions   Macrodantin [nitrofurantoin]    Morphine And Related Other (See Comments)   Altered mental state    Sulfa Antibiotics Other (See Comments)   Reaction: unknown      Medication List        Accurate as of 08/12/17 11:59 PM. Always use your most recent med list.          acetaminophen 325 MG tablet Commonly known as:  TYLENOL Take 650 mg by mouth 2 (two) times daily as needed.   acetaminophen 325 MG tablet Commonly known as:  TYLENOL Take 650 mg by mouth 2 (two) times daily.   clonazePAM 0.5 MG tablet Commonly known as:  KLONOPIN Take 0.25 mg by mouth every 8 (eight) hours as needed for anxiety.   feeding supplement (PRO-STAT SUGAR FREE 64) Liqd Take 30 mLs by mouth 3 (three) times daily with meals.   ipratropium-albuterol 0.5-2.5 (3) MG/3ML Soln Commonly known as:  DUONEB Take 3 mLs by nebulization every 6 (six) hours as needed.   lactose free nutrition Liqd Take 237 mLs by mouth 3 (three) times daily as needed.   levothyroxine 100 MCG tablet Commonly known as:  SYNTHROID, LEVOTHROID Take 1 tablet (100 mcg total) by mouth daily before breakfast. For thyroid   loratadine 10 MG tablet Commonly known as:  CLARITIN Take 10 mg by mouth daily as needed for  allergies.   magnesium hydroxide 400 MG/5ML suspension Commonly known as:  MILK OF MAGNESIA Take 30 mLs by mouth daily as needed for mild constipation.   mirtazapine 15 MG tablet Commonly known as:  REMERON Take 15 mg by mouth at bedtime.   OXYGEN Inhale 2 L/min into the lungs as needed. To keep O2 Sat >90%   phenytoin 100 MG ER capsule Commonly known as:  DILANTIN Take 100 mg by mouth daily.   phenytoin 200 MG ER capsule Commonly known as:  DILANTIN Take 200 mg by mouth at bedtime.   QUEtiapine 25 MG tablet Commonly known as:  SEROQUEL Take 25 mg by mouth at bedtime. At 8 pm   umeclidinium-vilanterol 62.5-25 MCG/INH Aepb Commonly known as:  ANORO ELLIPTA Inhale 1 puff into the lungs daily.       Review of Systems  Unable to perform ROS: Dementia (additional information provided by facility Nurse )    Immunization History  Administered Date(s) Administered  . Influenza Split 02/18/2017  . Influenza Whole 03/17/2007, 02/16/2009, 01/30/2010, 02/16/2013  . Influenza-Unspecified 03/02/2014, 02/22/2015, 02/28/2016  . PPD Test 07/09/2010,  02/04/2015, 04/04/2015, 06/04/2015  . Pneumococcal Conjugate-13 01/13/2017  . Pneumococcal Polysaccharide-23 05/19/2005, 05/23/2013  . Td 05/19/2005  . Tetanus 10/06/2012  . Zoster 05/19/2012   Pertinent  Health Maintenance Due  Topic Date Due  . DEXA SCAN  11/19/1994  . INFLUENZA VACCINE  02/25/2018 (Originally 12/17/2017)  . PNA vac Low Risk Adult  Completed   Fall Risk  05/25/2017 03/13/2017 02/18/2017 01/05/2017 10/09/2015  Falls in the past year? Yes No Yes No No  Number falls in past yr: 1 - 2 or more - -  Injury with Fall? Yes - No - -  Comment - - - - -  Risk Factor Category  - - High Fall Risk - -  Risk for fall due to : History of fall(s) - - - -  Follow up Falls evaluation completed - - - -    Vitals:   08/12/17 1625  BP: 134/64  Pulse: 78  Resp: 18  Temp: (!) 97.1 F (36.2 C)  TempSrc: Oral  SpO2: 92%  Weight:  112 lb 9.6 oz (51.1 kg)  Height: 5\' 7"  (1.702 m)   Body mass index is 17.64 kg/m. Physical Exam  Constitutional:  Thin built frail elderly in no acute distress   HENT:  Head: Normocephalic.  Right Ear: External ear normal.  Left Ear: External ear normal.  Mouth/Throat: Oropharynx is clear and moist. No oropharyngeal exudate.  Eyes: Pupils are equal, round, and reactive to light. Conjunctivae and EOM are normal. Right eye exhibits no discharge. Left eye exhibits no discharge. No scleral icterus.  Neck: Normal range of motion. No JVD present. No thyromegaly present.  Cardiovascular: Normal rate, regular rhythm, normal heart sounds and intact distal pulses. Exam reveals no gallop and no friction rub.  No murmur heard. Pulmonary/Chest: Effort normal. No respiratory distress. She has no wheezes. She has no rales.  Poor air entry though not following instruction to take deep breaths during visit.oxygen via nasal cannula in place.   Abdominal: Soft. Bowel sounds are normal. She exhibits no distension. There is no tenderness. There is no rebound and no guarding.  Musculoskeletal: She exhibits deformity. She exhibits no edema or tenderness.  Unsteady gait uses FWW.moves x 4 extremities.arthritic changes to fingers.   Lymphadenopathy:    She has no cervical adenopathy.  Neurological: She is alert. She has normal strength. She displays no seizure activity. Gait abnormal.  Confused at her baseline   Skin: Skin is warm and dry. No rash noted. No erythema. No pallor.  Psychiatric: She has a normal mood and affect. Her speech is normal. Cognition and memory are impaired.    Labs reviewed: Recent Labs    11/17/16 0454  04/08/17 1506 04/08/17 2336  04/30/17 0940 05/15/17 1136 06/11/17  NA 140   < > 139 138   < > 142 140 145  145  K 3.6   < > 5.0 4.4   < > 4.5 4.7 4.3  4.3  CL 103  --  104 110  --   --   --   --   CO2 31   < > 28 22  --  29 27  --   GLUCOSE 95   < > 157* 138*  --  92 93  --    BUN 10   < > 33* 26*   < > 31.3* 24.3 29*  29*  CREATININE 0.53   < > 0.78 0.55   < > 0.9 0.9 0.6  0.63  CALCIUM  8.5*   < > 8.9 7.7*  --  9.6 9.5 9.1   < > = values in this interval not displayed.   Recent Labs    04/30/17 0940 05/15/17 1136 06/11/17  AST 19 16 14  14   ALT 11 12 8  8   ALKPHOS 81 75 76  76  BILITOT 0.30 0.22 0.3  PROT 7.4 7.5 6.7  ALBUMIN 3.4* 2.8* 3.2   Recent Labs    04/08/17 1506  04/11/17 0636  04/30/17 0940 05/15/17 1136 06/11/17 06/15/17  WBC 9.3   < > 3.8*   < > 7.1 9.1 13.1 9.2  NEUTROABS 8.3*  --   --   --  5.8 7.1*  --   --   HGB 10.9*   < > 9.7*   < > 12.6 11.8 12.4 11.7*  HCT 32.9*   < > 29.3*   < > 39.6 37.0 37.1 35*  MCV 100.3*   < > 102.1*  --  106.5* 105.1*  --   --   PLT 82*   < > 79*   < > 208 342  --  259   < > = values in this interval not displayed.   Lab Results  Component Value Date   TSH 1.90 04/20/2017   Lab Results  Component Value Date   HGBA1C 5.4 03/08/2015   Lab Results  Component Value Date   CHOL 235 (H) 03/06/2009   HDL 108.40 03/06/2009   LDLDIRECT 108.2 03/06/2009    Significant Diagnostic Results in last 30 days:  No results found.  Assessment/Plan 1. Weight loss Weight log reviewed:  Wt 128.4 lbs ( 06/05/2017) Wt 132.1 lbs ( 06/17/2017) Wt 122.7 lbs (07/01/2017) Wt 113.5 lbs ( 07/29/2017). Her poor oral intake and history of NSCL cancer contributing to her weight loss.continue on Remeron 15 mg tablet at bedtime for appetite.continue on protein supplements and follow up with facility Registered dietician.continue to monitor weight.    2. Hypothyroidism Continue on levothyroxine 100 mcg tablet daily.Monitor TSH level.   3. Seizure disorder  No seizure activity reported.continue on dilantin 100 mg daily and 200 mg capsule at bedtime.   4. Dementia with behavioral disturbance Remains high risk for elopement recently exited the facility door to sit on bench outside the facility.Wander guard in  place.continue to monitor and assist with ADL's.Encourage oral intake and hydration.    Family/ staff Communication: Reviewed plan of care with patient and facility Nurse.   Labs/tests ordered: None   Loretto Belinsky C Kiyani Jernigan, NP

## 2017-08-19 ENCOUNTER — Other Ambulatory Visit: Payer: Self-pay

## 2017-08-20 ENCOUNTER — Ambulatory Visit: Payer: Self-pay | Admitting: Oncology

## 2017-09-09 ENCOUNTER — Encounter: Payer: Self-pay | Admitting: Internal Medicine

## 2017-09-09 ENCOUNTER — Non-Acute Institutional Stay (SKILLED_NURSING_FACILITY): Payer: Medicare Other | Admitting: Internal Medicine

## 2017-09-09 DIAGNOSIS — E039 Hypothyroidism, unspecified: Secondary | ICD-10-CM

## 2017-09-09 DIAGNOSIS — J9611 Chronic respiratory failure with hypoxia: Secondary | ICD-10-CM | POA: Diagnosis not present

## 2017-09-09 DIAGNOSIS — R627 Adult failure to thrive: Secondary | ICD-10-CM | POA: Diagnosis not present

## 2017-09-09 DIAGNOSIS — E43 Unspecified severe protein-calorie malnutrition: Secondary | ICD-10-CM | POA: Diagnosis not present

## 2017-09-09 DIAGNOSIS — F0391 Unspecified dementia with behavioral disturbance: Secondary | ICD-10-CM | POA: Diagnosis not present

## 2017-09-09 DIAGNOSIS — G40909 Epilepsy, unspecified, not intractable, without status epilepticus: Secondary | ICD-10-CM | POA: Diagnosis not present

## 2017-09-09 NOTE — Progress Notes (Signed)
Location:  [name] Room Number: 25 Place of Service:  SNF [phone]) Provider:  [name] MD  [name], MD  Patient Care Team: [name], MD as PCP - General (Internal Medicine) [name], [facility] [name], MD as Consulting Physician (Orthopedic Surgery) [name], MD as Consulting Physician (Physical Medicine and Rehabilitation) Melina Schools, MD as Consulting Physician (Orthopedic Surgery) [name], MD as Consulting Physician (Otolaryngology) [name], [name], NP as Nurse Practitioner (Family Medicine)  Extended Emergency Contact Information Primary Emergency Contact: [name] Address: [address]          [name] of [place] Phone: [phone] Relation: Son Secondary Emergency Contact: [name] [place] of [place] Phone: [phone] Mobile Phone: [phone] Relation: Daughter  Code Status:  DNR  Goals of care: Advanced Directive information Advanced Directives 09/09/2017  Does Patient Have a Medical Advance Directive? Yes  Type of Paramedic of [place];Living will;Out of facility DNR (pink MOST or yellow form)  Does patient want to make changes to medical advance directive? No - Patient declined  Copy of [place] in Chart? Yes  Pre-existing out of facility DNR order (yellow form or pink MOST form) Yellow form placed in chart (order not valid for inpatient use);Pink MOST form placed in chart (order not valid for inpatient use)     Chief Complaint  Patient presents with  . Medical Management of Chronic Issues    Routine Visit     HPI:  Pt is a 84 y.o. female seen today for medical management of chronic diseases. Her oral intake continues to be poor. She has been losing weight. Patient's family had a meeting with dietary team and family agrees to focus on comfort care and get hospice service involved. Denies any pain this  visit. Currently on tylenol for pain. Limited HPI and ROS from patient due to confusion. She takes 25-50% of her supplement per nursing. She refuses to be fed. Breathing overall stable, not using oxygen at present. Behavior overall stable. Taking seroquel and remeron. Tolerating seizure medication well. She does not participate in HPI and ROS.    Past Medical History:  Diagnosis Date  . Anemia, unspecified 03/18/2011  . Anxiety state, unspecified 01/2011  . Blood in stool 06/15/2012  . Corns and callosities 07/01/2011  . Dementia   . Diverticulosis 02/2011  . Dizziness   . DJD (degenerative joint disease)   . Dysuria 06/17/2011  . Hemorrhoids 02/2011  . History of radiation therapy 03/10/17-04/22/17   left lung 60 Gy in 30 fractions  . Low back pain   . Lung cancer ([place])    left  . Mitral valve problem thickened   thickened  . Osteoarthritis of both hands 10/09/2015  . Osteoporosis 02/2011  . Other abnormal blood chemistry 0/30/2012  . Other acquired deformity of toe 12/16/2011  . Pain in joint, site unspecified 01/2012  . 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 Sacroiliitis, not elsewhere classified ([place]) 05/2011  . Seizures ([place])    Remotely  . Unspecified hypothyroidism 01/2011  . 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 Vitamin D deficiency 01/2011   Past Surgical History:  Procedure Laterality Date  . bletheroplasty    . CATARACT EXTRACTION W/ INTRAOCULAR LENS  IMPLANT, BILATERAL  2010  . CERVICAL LAMINECTOMY  2005  . CYSTOSCOPY WITH RETROGRADE PYELOGRAM, URETEROSCOPY AND STENT PLACEMENT Bilateral 11/16/2016   Procedure: CYSTOSCOPY WITH  RETROGRADE PYELOGRAMS/CLOT EVACUATION;  Surgeon: Irine Seal, MD;  Location: [facility];  Service: Urology;  Laterality: Bilateral;  . HAMMER TOE SURGERY  2013   right 2nd toe Dr. Mallie Mussel  . IVC FILTER PLACEMENT ([facility] HX)    . JOINT REPLACEMENT Bilateral 2002 Right, 1992 Left   knees  .  TRANSURETHRAL RESECTION OF BLADDER TUMOR N/A 11/16/2016   Procedure: TRANSURETHRAL RESECTION OF BLADDER TUMOR (TURBT);  Surgeon: Irine Seal, MD;  [facility];  Service: Urology;  Laterality: N/A;    Allergies  Allergen Reactions  . Macrodantin [Nitrofurantoin]   . Morphine And Related Other (See Comments)    Altered mental state   . Sulfa Antibiotics Other (See Comments)    Reaction: unknown    Outpatient Encounter Medications as of 09/09/2017  Medication Sig  . acetaminophen (TYLENOL) 325 MG tablet Take 650 mg by mouth 2 (two) times daily as needed.   Marland Kitchen acetaminophen (TYLENOL) 325 MG tablet Take 650 mg by mouth 2 (two) times daily.  . Amino Acids-Protein Hydrolys (FEEDING SUPPLEMENT, PRO-STAT SUGAR FREE 64,) LIQD Take 30 mLs by mouth 3 (three) times daily with meals.  . clonazePAM (KLONOPIN) 0.5 MG tablet Take 0.25 mg by mouth every 8 (eight) hours as needed for anxiety.   Marland Kitchen ipratropium-albuterol (DUONEB) 0.5-2.5 (3) MG/3ML SOLN Take 3 mLs by nebulization every 6 (six) hours as needed.  . lactose free nutrition (BOOST) LIQD Take 237 mLs by mouth 3 (three) times daily as needed.   Marland Kitchen levothyroxine (SYNTHROID, LEVOTHROID) 100 MCG tablet Take 1 tablet (100 mcg total) by mouth daily before breakfast. For thyroid  . loratadine (CLARITIN) 10 MG tablet Take 10 mg by mouth daily as needed for allergies.  . magnesium hydroxide (MILK OF MAGNESIA) 400 MG/5ML suspension Take 30 mLs by mouth daily as needed for mild constipation.  . mirtazapine (REMERON) 15 MG tablet Take 15 mg by mouth at bedtime.  . OXYGEN Inhale 2 L/min into the lungs as needed. To keep O2 Sat >90%  . phenytoin (DILANTIN) 100 MG ER capsule Take 100 mg by mouth daily.   . phenytoin (DILANTIN) 200 MG ER capsule Take 200 mg by mouth at bedtime.  Marland Kitchen QUEtiapine (SEROQUEL) 25 MG tablet Take 25 mg by mouth at bedtime. At 8 pm  . umeclidinium-vilanterol (ANORO ELLIPTA) 62.5-25 MCG/INH AEPB Inhale 1 puff into the lungs daily.   No  facility-administered encounter medications on file as of 09/09/2017.     Review of Systems  Unable to perform ROS: Dementia  Constitutional: Positive for appetite change. Negative for diaphoresis and fever.  HENT: Negative for rhinorrhea.   Respiratory: Positive for cough. Negative for shortness of breath.   Cardiovascular: Negative for chest pain and leg swelling.  Gastrointestinal: Negative for diarrhea and vomiting.  Musculoskeletal: Positive for gait problem.       Uses walker, no fall reported  Skin: Negative for rash and wound.  Neurological: Negative for headaches.  Psychiatric/Behavioral: Positive for behavioral problems and confusion.    Immunization History  Administered Date(s) Administered  . Influenza Split 02/18/2017  . Influenza Whole 03/17/2007, 02/16/2009, 01/30/2010, 02/16/2013  . Influenza-Unspecified 03/02/2014, 02/22/2015, 02/28/2016  . PPD Test 07/09/2010, 02/04/2015, 04/04/2015, 06/04/2015  . Pneumococcal Conjugate-13 01/13/2017  . Pneumococcal Polysaccharide-23 05/19/2005, 05/23/2013  . Td 05/19/2005  . Tetanus 10/06/2012  . Zoster 05/19/2012   Pertinent  Health Maintenance Due  Topic Date Due  . DEXA SCAN  11/19/1994  . INFLUENZA VACCINE  02/25/2018 (Originally 12/17/2017)  . PNA vac  Low Risk Adult  Completed   Fall Risk  05/25/2017 03/13/2017 02/18/2017 01/05/2017 10/09/2015  Falls in the past year? Yes No Yes No No  Number falls in past yr: 1 - 2 or more - -  Injury with Fall? Yes - No - -  Comment - - - - -  Risk Factor Category  - - High Fall Risk - -  Risk for fall due to : History of fall(s) - - - -  Follow up Falls evaluation completed - - - -   Functional Status Survey:    Vitals:   09/09/17 1421  BP: 93/63  Pulse: 75  Resp: 20  Temp: 98.8 F (37.1 C)  TempSrc: Oral  SpO2: 92%  Weight: 106 lb 6.4 oz (48.3 kg)  Height: 5\' 7"  (1.702 m)   Body mass index is 16.66 kg/m.   Wt Readings from Last 3 Encounters:  09/09/17 106 lb 6.4 oz  (48.3 kg)  08/12/17 112 lb 9.6 oz (51.1 kg)  07/15/17 122 lb 11.2 oz (55.7 kg)   Physical Exam  Constitutional:  Thin built, frail, elderly female in no acute distress  HENT:  Head: Normocephalic and atraumatic.  Right Ear: External ear normal.  Left Ear: External ear normal.  Mouth/Throat: Oropharynx is clear and moist. No oropharyngeal exudate.  Eyes: Pupils are equal, round, and reactive to light. EOM are normal. Right eye exhibits no discharge.  Neck: Normal range of motion. Neck supple.  Cardiovascular: Normal rate and regular rhythm.  Pulmonary/Chest: Effort normal. She has no wheezes. She has no rales.  Decreased air entry to lung bases  Abdominal: Soft. Bowel sounds are normal. There is no tenderness.  Musculoskeletal: She exhibits deformity. She exhibits no edema or tenderness.  Able to move all 4 extremities, unsteady gait, uses walker  Lymphadenopathy:    She has no cervical adenopathy.  Neurological:  Pleasantly confused  Skin: Skin is warm and dry. Capillary refill takes more than 3 seconds.  Psychiatric: She has a normal mood and affect.    Labs reviewed: Recent Labs    11/17/16 0454  04/08/17 1506 04/08/17 2336  04/30/17 0940 05/15/17 1136 06/11/17  NA 140   < > 139 138   < > 142 140 145  145  K 3.6   < > 5.0 4.4   < > 4.5 4.7 4.3  4.3  CL 103  --  104 110  --   --   --   --   CO2 31   < > 28 22  --  29 27  --   GLUCOSE 95   < > 157* 138*  --  92 93  --   BUN 10   < > 33* 26*   < > 31.3* 24.3 29*  29*  CREATININE 0.53   < > 0.78 0.55   < > 0.9 0.9 0.6  0.63  CALCIUM 8.5*   < > 8.9 7.7*  --  9.6 9.5 9.1   < > = values in this interval not displayed.   Recent Labs    04/30/17 0940 05/15/17 1136 06/11/17  AST 19 16 14  14   ALT 11 12 8  8   ALKPHOS 81 75 76  76  BILITOT 0.30 0.22 0.3  PROT 7.4 7.5 6.7  ALBUMIN 3.4* 2.8* 3.2   Recent Labs    04/08/17 1506  04/11/17 0636  04/30/17 0940 05/15/17 1136 06/11/17 06/15/17  WBC 9.3   < > 3.8*    < >  7.1 9.1 13.1 9.2  NEUTROABS 8.3*  --   --   --  5.8 7.1*  --   --   HGB 10.9*   < > 9.7*   < > 12.6 11.8 12.4 11.7*  HCT 32.9*   < > 29.3*   < > 39.6 37.0 37.1 35*  MCV 100.3*   < > 102.1*  --  106.5* 105.1*  --   --   PLT 82*   < > 79*   < > 208 342  --  259   < > = values in this interval not displayed.   Lab Results  Component Value Date   TSH 1.90 04/20/2017   Lab Results  Component Value Date   HGBA1C 5.4 03/08/2015   Lab Results  Component Value Date   CHOL 235 (H) 03/06/2009   HDL 108.40 03/06/2009   LDLDIRECT 108.2 03/06/2009    Significant Diagnostic Results in last 30 days:  No results found.  Assessment/Plan  Failure to thrive Poor oral intake, losing weight, low energy level. Likely decline with her dementia and multiple medical co-morbidities. Obtain hospice referral with goal of care for comfort.  Severe protein calorie malnutrition Continues to lose weight. Decline anticipated. Pressure ulcer prophylaxis. Encourage po feed and supplement as tolerated. Continue remeron  Chronic respiratory failure With ILD, COPD and chronic PE along with squamous NSCLC. Breathing stable this visit, does not keep o2 by nasal canula for most part of the day. Continue bronchodilator and oxygen. Supportive care  Dementia with behavioral disturbance Supportive care, continue seroquel with remeron and monitor. Continue prn clonazepam  Hypothyroidism Continue levothyroxine for now  Seizure disorder Seizure free. Continue phenytoin and monitor    Family/ staff Communication: reviewed care plan with patient and charge nurse.    Labs/tests ordered:  Hospice referral   [name], MD Internal Medicine [place], [place] [address] Cell Phone (Monday-Friday 8 am - 5 pm): [phone] On Call: [phone] and follow prompts after 5 pm and on weekends Office Phone: [phone] Office Fax: [phone]

## 2017-09-15 DIAGNOSIS — R634 Abnormal weight loss: Secondary | ICD-10-CM | POA: Diagnosis not present

## 2017-09-15 DIAGNOSIS — E039 Hypothyroidism, unspecified: Secondary | ICD-10-CM | POA: Diagnosis not present

## 2017-09-15 DIAGNOSIS — R0902 Hypoxemia: Secondary | ICD-10-CM | POA: Diagnosis not present

## 2017-09-15 DIAGNOSIS — C3490 Malignant neoplasm of unspecified part of unspecified bronchus or lung: Secondary | ICD-10-CM | POA: Diagnosis not present

## 2017-09-15 DIAGNOSIS — I10 Essential (primary) hypertension: Secondary | ICD-10-CM | POA: Diagnosis not present

## 2017-09-15 DIAGNOSIS — J9610 Chronic respiratory failure, unspecified whether with hypoxia or hypercapnia: Secondary | ICD-10-CM | POA: Diagnosis not present

## 2017-09-15 DIAGNOSIS — F339 Major depressive disorder, recurrent, unspecified: Secondary | ICD-10-CM | POA: Diagnosis not present

## 2017-09-15 DIAGNOSIS — R569 Unspecified convulsions: Secondary | ICD-10-CM | POA: Diagnosis not present

## 2017-09-15 DIAGNOSIS — Z86718 Personal history of other venous thrombosis and embolism: Secondary | ICD-10-CM | POA: Diagnosis not present

## 2017-09-15 DIAGNOSIS — E46 Unspecified protein-calorie malnutrition: Secondary | ICD-10-CM | POA: Diagnosis not present

## 2017-09-15 DIAGNOSIS — F0281 Dementia in other diseases classified elsewhere with behavioral disturbance: Secondary | ICD-10-CM | POA: Diagnosis not present

## 2017-09-15 DIAGNOSIS — J849 Interstitial pulmonary disease, unspecified: Secondary | ICD-10-CM | POA: Diagnosis not present

## 2017-09-15 DIAGNOSIS — Z86711 Personal history of pulmonary embolism: Secondary | ICD-10-CM | POA: Diagnosis not present

## 2017-09-15 DIAGNOSIS — E785 Hyperlipidemia, unspecified: Secondary | ICD-10-CM | POA: Diagnosis not present

## 2017-09-15 DIAGNOSIS — R630 Anorexia: Secondary | ICD-10-CM | POA: Diagnosis not present

## 2017-09-16 DIAGNOSIS — R634 Abnormal weight loss: Secondary | ICD-10-CM | POA: Diagnosis not present

## 2017-09-16 DIAGNOSIS — Z86718 Personal history of other venous thrombosis and embolism: Secondary | ICD-10-CM | POA: Diagnosis not present

## 2017-09-16 DIAGNOSIS — R630 Anorexia: Secondary | ICD-10-CM | POA: Diagnosis not present

## 2017-09-16 DIAGNOSIS — F0281 Dementia in other diseases classified elsewhere with behavioral disturbance: Secondary | ICD-10-CM | POA: Diagnosis not present

## 2017-09-16 DIAGNOSIS — Z86711 Personal history of pulmonary embolism: Secondary | ICD-10-CM | POA: Diagnosis not present

## 2017-09-16 DIAGNOSIS — F339 Major depressive disorder, recurrent, unspecified: Secondary | ICD-10-CM | POA: Diagnosis not present

## 2017-09-16 DIAGNOSIS — E46 Unspecified protein-calorie malnutrition: Secondary | ICD-10-CM | POA: Diagnosis not present

## 2017-09-16 DIAGNOSIS — E785 Hyperlipidemia, unspecified: Secondary | ICD-10-CM | POA: Diagnosis not present

## 2017-09-16 DIAGNOSIS — J9610 Chronic respiratory failure, unspecified whether with hypoxia or hypercapnia: Secondary | ICD-10-CM | POA: Diagnosis not present

## 2017-09-16 DIAGNOSIS — E039 Hypothyroidism, unspecified: Secondary | ICD-10-CM | POA: Diagnosis not present

## 2017-09-16 DIAGNOSIS — C3490 Malignant neoplasm of unspecified part of unspecified bronchus or lung: Secondary | ICD-10-CM | POA: Diagnosis not present

## 2017-09-16 DIAGNOSIS — J849 Interstitial pulmonary disease, unspecified: Secondary | ICD-10-CM | POA: Diagnosis not present

## 2017-09-16 DIAGNOSIS — I10 Essential (primary) hypertension: Secondary | ICD-10-CM | POA: Diagnosis not present

## 2017-09-16 DIAGNOSIS — R0902 Hypoxemia: Secondary | ICD-10-CM | POA: Diagnosis not present

## 2017-09-16 DIAGNOSIS — R569 Unspecified convulsions: Secondary | ICD-10-CM | POA: Diagnosis not present

## 2017-09-17 DIAGNOSIS — F0281 Dementia in other diseases classified elsewhere with behavioral disturbance: Secondary | ICD-10-CM | POA: Diagnosis not present

## 2017-09-17 DIAGNOSIS — J849 Interstitial pulmonary disease, unspecified: Secondary | ICD-10-CM | POA: Diagnosis not present

## 2017-09-17 DIAGNOSIS — E46 Unspecified protein-calorie malnutrition: Secondary | ICD-10-CM | POA: Diagnosis not present

## 2017-09-17 DIAGNOSIS — J9610 Chronic respiratory failure, unspecified whether with hypoxia or hypercapnia: Secondary | ICD-10-CM | POA: Diagnosis not present

## 2017-09-17 DIAGNOSIS — C3490 Malignant neoplasm of unspecified part of unspecified bronchus or lung: Secondary | ICD-10-CM | POA: Diagnosis not present

## 2017-09-17 DIAGNOSIS — Z86718 Personal history of other venous thrombosis and embolism: Secondary | ICD-10-CM | POA: Diagnosis not present

## 2017-09-22 DIAGNOSIS — E46 Unspecified protein-calorie malnutrition: Secondary | ICD-10-CM | POA: Diagnosis not present

## 2017-09-22 DIAGNOSIS — J9610 Chronic respiratory failure, unspecified whether with hypoxia or hypercapnia: Secondary | ICD-10-CM | POA: Diagnosis not present

## 2017-09-22 DIAGNOSIS — Z86718 Personal history of other venous thrombosis and embolism: Secondary | ICD-10-CM | POA: Diagnosis not present

## 2017-09-22 DIAGNOSIS — J849 Interstitial pulmonary disease, unspecified: Secondary | ICD-10-CM | POA: Diagnosis not present

## 2017-09-22 DIAGNOSIS — C3490 Malignant neoplasm of unspecified part of unspecified bronchus or lung: Secondary | ICD-10-CM | POA: Diagnosis not present

## 2017-09-22 DIAGNOSIS — F0281 Dementia in other diseases classified elsewhere with behavioral disturbance: Secondary | ICD-10-CM | POA: Diagnosis not present

## 2017-09-24 DIAGNOSIS — Z86718 Personal history of other venous thrombosis and embolism: Secondary | ICD-10-CM | POA: Diagnosis not present

## 2017-09-24 DIAGNOSIS — C3490 Malignant neoplasm of unspecified part of unspecified bronchus or lung: Secondary | ICD-10-CM | POA: Diagnosis not present

## 2017-09-24 DIAGNOSIS — E46 Unspecified protein-calorie malnutrition: Secondary | ICD-10-CM | POA: Diagnosis not present

## 2017-09-24 DIAGNOSIS — J849 Interstitial pulmonary disease, unspecified: Secondary | ICD-10-CM | POA: Diagnosis not present

## 2017-09-24 DIAGNOSIS — J9610 Chronic respiratory failure, unspecified whether with hypoxia or hypercapnia: Secondary | ICD-10-CM | POA: Diagnosis not present

## 2017-09-24 DIAGNOSIS — F0281 Dementia in other diseases classified elsewhere with behavioral disturbance: Secondary | ICD-10-CM | POA: Diagnosis not present

## 2017-09-28 DIAGNOSIS — J9610 Chronic respiratory failure, unspecified whether with hypoxia or hypercapnia: Secondary | ICD-10-CM | POA: Diagnosis not present

## 2017-09-28 DIAGNOSIS — F0281 Dementia in other diseases classified elsewhere with behavioral disturbance: Secondary | ICD-10-CM | POA: Diagnosis not present

## 2017-09-28 DIAGNOSIS — E46 Unspecified protein-calorie malnutrition: Secondary | ICD-10-CM | POA: Diagnosis not present

## 2017-09-28 DIAGNOSIS — C3490 Malignant neoplasm of unspecified part of unspecified bronchus or lung: Secondary | ICD-10-CM | POA: Diagnosis not present

## 2017-09-28 DIAGNOSIS — Z86718 Personal history of other venous thrombosis and embolism: Secondary | ICD-10-CM | POA: Diagnosis not present

## 2017-09-28 DIAGNOSIS — J849 Interstitial pulmonary disease, unspecified: Secondary | ICD-10-CM | POA: Diagnosis not present

## 2017-10-02 DIAGNOSIS — J9610 Chronic respiratory failure, unspecified whether with hypoxia or hypercapnia: Secondary | ICD-10-CM | POA: Diagnosis not present

## 2017-10-02 DIAGNOSIS — Z86718 Personal history of other venous thrombosis and embolism: Secondary | ICD-10-CM | POA: Diagnosis not present

## 2017-10-02 DIAGNOSIS — F0281 Dementia in other diseases classified elsewhere with behavioral disturbance: Secondary | ICD-10-CM | POA: Diagnosis not present

## 2017-10-02 DIAGNOSIS — J849 Interstitial pulmonary disease, unspecified: Secondary | ICD-10-CM | POA: Diagnosis not present

## 2017-10-02 DIAGNOSIS — E46 Unspecified protein-calorie malnutrition: Secondary | ICD-10-CM | POA: Diagnosis not present

## 2017-10-02 DIAGNOSIS — C3490 Malignant neoplasm of unspecified part of unspecified bronchus or lung: Secondary | ICD-10-CM | POA: Diagnosis not present

## 2017-10-06 ENCOUNTER — Encounter: Payer: Self-pay | Admitting: Family

## 2017-10-06 ENCOUNTER — Non-Acute Institutional Stay (SKILLED_NURSING_FACILITY): Payer: Medicare Other | Admitting: Family

## 2017-10-06 DIAGNOSIS — J439 Emphysema, unspecified: Secondary | ICD-10-CM

## 2017-10-06 DIAGNOSIS — C3490 Malignant neoplasm of unspecified part of unspecified bronchus or lung: Secondary | ICD-10-CM | POA: Diagnosis not present

## 2017-10-06 DIAGNOSIS — G40909 Epilepsy, unspecified, not intractable, without status epilepticus: Secondary | ICD-10-CM | POA: Diagnosis not present

## 2017-10-06 DIAGNOSIS — F0391 Unspecified dementia with behavioral disturbance: Secondary | ICD-10-CM

## 2017-10-06 DIAGNOSIS — Z86718 Personal history of other venous thrombosis and embolism: Secondary | ICD-10-CM | POA: Diagnosis not present

## 2017-10-06 DIAGNOSIS — E46 Unspecified protein-calorie malnutrition: Secondary | ICD-10-CM | POA: Diagnosis not present

## 2017-10-06 DIAGNOSIS — C771 Secondary and unspecified malignant neoplasm of intrathoracic lymph nodes: Secondary | ICD-10-CM

## 2017-10-06 DIAGNOSIS — J9610 Chronic respiratory failure, unspecified whether with hypoxia or hypercapnia: Secondary | ICD-10-CM | POA: Diagnosis not present

## 2017-10-06 DIAGNOSIS — J9611 Chronic respiratory failure with hypoxia: Secondary | ICD-10-CM

## 2017-10-06 DIAGNOSIS — E039 Hypothyroidism, unspecified: Secondary | ICD-10-CM | POA: Diagnosis not present

## 2017-10-06 DIAGNOSIS — J849 Interstitial pulmonary disease, unspecified: Secondary | ICD-10-CM | POA: Diagnosis not present

## 2017-10-06 DIAGNOSIS — F0281 Dementia in other diseases classified elsewhere with behavioral disturbance: Secondary | ICD-10-CM | POA: Diagnosis not present

## 2017-10-08 DIAGNOSIS — E039 Hypothyroidism, unspecified: Secondary | ICD-10-CM | POA: Diagnosis not present

## 2017-10-08 LAB — TSH
TSH: 1.13
TSH: 1.13 (ref 0.41–5.90)

## 2017-10-09 ENCOUNTER — Encounter: Payer: Self-pay | Admitting: *Deleted

## 2017-10-09 DIAGNOSIS — D61818 Other pancytopenia: Secondary | ICD-10-CM | POA: Insufficient documentation

## 2017-10-09 DIAGNOSIS — J439 Emphysema, unspecified: Secondary | ICD-10-CM | POA: Insufficient documentation

## 2017-10-09 DIAGNOSIS — C771 Secondary and unspecified malignant neoplasm of intrathoracic lymph nodes: Secondary | ICD-10-CM | POA: Insufficient documentation

## 2017-10-09 NOTE — Progress Notes (Signed)
Location:  [place] Room Number: 25 Place of Service:  SNF (31) Provider: Rudolph Daoust FNP-C   [name], MD  Patient Care Team: [name], MD as PCP - General (Internal Medicine) [name], [facility] [name], MD as Consulting Physician (Orthopedic Surgery) [name], MD as Consulting Physician (Physical Medicine and Rehabilitation) Melina Schools, MD as Consulting Physician (Orthopedic Surgery) [name], MD as Consulting Physician (Otolaryngology) Tarsha Blando, [name], NP as Nurse Practitioner (Family Medicine)  Extended Emergency Contact Information Primary Emergency Contact: [name] Address: [address]          [name] of [place] Phone: [phone] Relation: Son Secondary Emergency Contact: [name] [place] of [place] Phone: [phone] Mobile Phone: [phone] Relation: Daughter  Code Status:  DNR Goals of care: Advanced Directive information Advanced Directives 10/06/2017  Does Patient Have a Medical Advance Directive? Yes  Type of Paramedic of [place];Out of facility DNR (pink MOST or yellow form);Living will  Does patient want to make changes to medical advance directive? -  Copy of [place] in Chart? Yes  Pre-existing out of facility DNR order (yellow form or pink MOST form) Yellow form placed in chart (order not valid for inpatient use);Pink MOST form placed in chart (order not valid for inpatient use)     Chief Complaint  Patient presents with  . Medical Management of Chronic Issues    HPI:  Pt is a 84 y.o. female seen today [place] for medical management of chronic diseases.she has a medical history of chronic respiratory failure with hypoxia,hypothyroidism,NSCLC,Hx DVT,PE,seizure disorder,OA,Failure to thrive,dementia with behavioral disturbance among other conditions.she is seen in her room today pleasantly  confused unable to provide HPI and ROS information.Facility Nurse states patient was observed on 10/05/2017 sitting on the floor between her closet and the bed.No acute injuries noted.she continues to wander on facility hallways and into other residents room at times without her oxygen. Nurse states patient removes her nasal cannula oxygen tubings and puts  It into the trash can. Her oxygen saturations are in the upper 80's.Her weight is stable this visit on protein supplements.       Past Medical History:  Diagnosis Date  . Anemia, unspecified 03/18/2011  . Anxiety state, unspecified 01/2011  . Blood in stool 06/15/2012  . Corns and callosities 07/01/2011  . Dementia   . Diverticulosis 02/2011  . Dizziness   . DJD (degenerative joint disease)   . Dysuria 06/17/2011  . Hemorrhoids 02/2011  . History of radiation therapy 03/10/17-04/22/17   left lung 60 Gy in 30 fractions  . Low back pain   . Lung cancer ([place])    left  . Mitral valve problem thickened   thickened  . Osteoarthritis of both hands 10/09/2015  . Osteoporosis 02/2011  . Other abnormal blood chemistry 0/30/2012  . Other acquired deformity of toe 12/16/2011  . Pain in joint, site unspecified 01/2012  . 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 Sacroiliitis, not elsewhere classified ([place]) 05/2011  . Seizures ([place])    Remotely  . Unspecified hypothyroidism 01/2011  . 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 Vitamin D deficiency 01/2011   Past Surgical History:  Procedure Laterality Date  . bletheroplasty    . CATARACT EXTRACTION W/ INTRAOCULAR LENS  IMPLANT, BILATERAL  2010  . CERVICAL LAMINECTOMY  2005  . CYSTOSCOPY WITH RETROGRADE PYELOGRAM, URETEROSCOPY AND STENT  PLACEMENT Bilateral 11/16/2016   Procedure: CYSTOSCOPY WITH RETROGRADE PYELOGRAMS/CLOT EVACUATION;  Surgeon: Irine Seal, MD;  [facility];  Service: Urology;  Laterality: Bilateral;  . HAMMER TOE SURGERY  2013    right 2nd toe Dr. Mallie Mussel  . IVC FILTER PLACEMENT ([facility] HX)    . JOINT REPLACEMENT Bilateral 2002 Right, 1992 Left   knees  . TRANSURETHRAL RESECTION OF BLADDER TUMOR N/A 11/16/2016   Procedure: TRANSURETHRAL RESECTION OF BLADDER TUMOR (TURBT);  Surgeon: Irine Seal, MD;  [facility];  Service: Urology;  Laterality: N/A;    Allergies  Allergen Reactions  . Macrodantin [Nitrofurantoin]   . Morphine And Related Other (See Comments)    Altered mental state   . Sulfa Antibiotics Other (See Comments)    Reaction: unknown    Allergies as of 10/06/2017      Reactions   Macrodantin [nitrofurantoin]    Morphine And Related Other (See Comments)   Altered mental state    Sulfa Antibiotics Other (See Comments)   Reaction: unknown      Medication List        Accurate as of 10/06/17 11:59 PM. Always use your most recent med list.          acetaminophen 325 MG tablet Commonly known as:  TYLENOL Take 650 mg by mouth 2 (two) times daily as needed.   acetaminophen 325 MG tablet Commonly known as:  TYLENOL Take 650 mg by mouth 2 (two) times daily.   ipratropium-albuterol 0.5-2.5 (3) MG/3ML Soln Commonly known as:  DUONEB Take 3 mLs by nebulization every 6 (six) hours as needed.   lactose free nutrition Liqd Take 237 mLs by mouth 3 (three) times daily as needed.   levothyroxine 100 MCG tablet Commonly known as:  SYNTHROID, LEVOTHROID Take 1 tablet (100 mcg total) by mouth daily before breakfast. For thyroid   magnesium hydroxide 400 MG/5ML suspension Commonly known as:  MILK OF MAGNESIA Take 30 mLs by mouth daily as needed for mild constipation.   mirtazapine 15 MG tablet Commonly known as:  REMERON Take 15 mg by mouth at bedtime.   OXYGEN Inhale 2 L/min into the lungs as needed. To keep O2 Sat >90%   phenytoin 100 MG ER capsule Commonly known as:  DILANTIN Take 100 mg by mouth daily.   phenytoin 200 MG ER capsule Commonly known as:  DILANTIN Take 200 mg by mouth at  bedtime.   QUEtiapine 25 MG tablet Commonly known as:  SEROQUEL Take 25 mg by mouth at bedtime. At 8 pm   umeclidinium-vilanterol 62.5-25 MCG/INH Aepb Commonly known as:  ANORO ELLIPTA Inhale 1 puff into the lungs daily.       Review of Systems  Unable to perform ROS: Dementia (additional information provided by facility Nurse )    Immunization History  Administered Date(s) Administered  . Influenza Split 02/18/2017  . Influenza Whole 03/17/2007, 02/16/2009, 01/30/2010, 02/16/2013  . Influenza-Unspecified 03/02/2014, 02/22/2015, 02/28/2016  . PPD Test 07/09/2010, 02/04/2015, 04/04/2015, 06/04/2015  . Pneumococcal Conjugate-13 01/13/2017  . Pneumococcal Polysaccharide-23 05/19/2005, 05/23/2013  . Td 05/19/2005  . Tetanus 10/06/2012  . Zoster 05/19/2012   Pertinent  Health Maintenance Due  Topic Date Due  . DEXA SCAN  11/19/1994  . INFLUENZA VACCINE  02/25/2018 (Originally 12/17/2017)  . PNA vac Low Risk Adult  Completed   Fall Risk  05/25/2017 03/13/2017 02/18/2017 01/05/2017 10/09/2015  Falls in the past year? Yes No Yes No No  Number falls in past yr: 1 - 2  or more - -  Injury with Fall? Yes - No - -  Comment - - - - -  Risk Factor Category  - - High Fall Risk - -  Risk for fall due to : History of fall(s) - - - -  Follow up Falls evaluation completed - - - -    Vitals:   10/06/17 1112  BP: 118/65  Pulse: 76  Resp: 20  Temp: 97.6 F (36.4 C)  SpO2: (!) 89%  Weight: 109 lb (49.4 kg)  Height: 5\' 7"  (1.702 m)   Body mass index is 17.07 kg/m. Physical Exam  Constitutional:  Thin built frail elderly in no acute distress   HENT:  Head: Normocephalic.  Right Ear: External ear normal.  Left Ear: External ear normal.  Mouth/Throat: Oropharynx is clear and moist. No oropharyngeal exudate.  Eyes: Pupils are equal, round, and reactive to light. Conjunctivae and EOM are normal. Right eye exhibits no discharge. Left eye exhibits no discharge. No scleral icterus.  Neck:  Normal range of motion. No JVD present. No thyromegaly present.  Cardiovascular: Intact distal pulses. Exam reveals no gallop and no friction rub.  Murmur heard. Pulmonary/Chest: Effort normal. No stridor. No respiratory distress. She has no wheezes. She has no rales.  Oxygen tubings off during visit.bilateral lung poor air entry.  Abdominal: Soft. Bowel sounds are normal. She exhibits no distension and no mass. There is no tenderness. There is no rebound and no guarding.  Musculoskeletal: She exhibits no edema or tenderness.  Unsteady gait ambulates with walker but forgets to use walker at times.  Lymphadenopathy:    She has no cervical adenopathy.  Neurological: Gait normal.  Alert and orient to self.  Skin: Skin is warm and dry. No rash noted. No erythema. No pallor.  Psychiatric: Her speech is normal. She is agitated. Cognition and memory are impaired.    Labs reviewed: Recent Labs    11/17/16 0454  04/08/17 1506 04/08/17 2336  04/30/17 0940 05/15/17 1136 06/11/17  NA 140   < > 139 138   < > 142 140 145  145  K 3.6   < > 5.0 4.4   < > 4.5 4.7 4.3  4.3  CL 103  --  104 110  --   --   --   --   CO2 31   < > 28 22  --  29 27  --   GLUCOSE 95   < > 157* 138*  --  92 93  --   BUN 10   < > 33* 26*   < > 31.3* 24.3 29*  29*  CREATININE 0.53   < > 0.78 0.55   < > 0.9 0.9 0.6  0.63  CALCIUM 8.5*   < > 8.9 7.7*  --  9.6 9.5 9.1   < > = values in this interval not displayed.   Recent Labs    04/30/17 0940 05/15/17 1136 06/11/17  AST 19 16 14  14   ALT 11 12 8  8   ALKPHOS 81 75 76  76  BILITOT 0.30 0.22 0.3  PROT 7.4 7.5 6.7  ALBUMIN 3.4* 2.8* 3.2   Recent Labs    04/08/17 1506  04/11/17 0636  04/30/17 0940 05/15/17 1136 06/11/17 06/15/17  WBC 9.3   < > 3.8*   < > 7.1 9.1 13.1 9.2  NEUTROABS 8.3*  --   --   --  5.8 7.1*  --   --  HGB 10.9*   < > 9.7*   < > 12.6 11.8 12.4 11.7*  HCT 32.9*   < > 29.3*   < > 39.6 37.0 37.1 35*  MCV 100.3*   < > 102.1*  --  106.5*  105.1*  --   --   PLT 82*   < > 79*   < > 208 342  --  259   < > = values in this interval not displayed.   Lab Results  Component Value Date   TSH 1.13 10/08/2017   Lab Results  Component Value Date   HGBA1C 5.4 03/08/2015   Lab Results  Component Value Date   CHOL 235 (H) 03/06/2009   HDL 108.40 03/06/2009   LDLDIRECT 108.2 03/06/2009    Significant Diagnostic Results in last 30 days:  No results found.  Assessment/Plan 1. Hypothyroidism Last TSH in 06/2017.continue on levothyroxine 100 mcg tablet daily.Recheck TSH level 10/08/2017.   2. Chronic respiratory failure with hypoxia  Breathing stable.Oxygen sats in the upper 80's but takes off her oxygen tubings and gets agitated when oxygen is applied on.continue on Duonebs as needed and Anoro Ellipta inhaler daily. Continue to encourage to wear her nasal cannula oxygen.continue on Hospice service.     3. Seizure disorder  No recent seizure activity reported.continue on Dilantin 100 mg capsule daily and 200 mg capsule at bedtime.   4. Dementia with behavioral disturbance High risk for elopement and high risk for falls.continue on Seroquel 25 mg tablet at bedtime. Continue with supportive care.   5. Secondary and unspecified malignant neoplasm of intrathoracic lymph nodes  Has squamous NSCLC status post chemotherapy and radiation.currently on Hospice service.continue to monitor.   6. Pulmonary emphysema, unspecified emphysema type Chronic.continue on Anoro Ellipta inhaler.continue to encourage oxygen via nasal cannula.   Family/ staff Communication: Reviewed plan of care with patient and facility Nurse.  Labs/tests ordered: TSH level 10/08/2017.    Sandrea Hughs, NP

## 2017-10-13 DIAGNOSIS — Z86718 Personal history of other venous thrombosis and embolism: Secondary | ICD-10-CM | POA: Diagnosis not present

## 2017-10-13 DIAGNOSIS — E46 Unspecified protein-calorie malnutrition: Secondary | ICD-10-CM | POA: Diagnosis not present

## 2017-10-13 DIAGNOSIS — J849 Interstitial pulmonary disease, unspecified: Secondary | ICD-10-CM | POA: Diagnosis not present

## 2017-10-13 DIAGNOSIS — C3490 Malignant neoplasm of unspecified part of unspecified bronchus or lung: Secondary | ICD-10-CM | POA: Diagnosis not present

## 2017-10-13 DIAGNOSIS — F0281 Dementia in other diseases classified elsewhere with behavioral disturbance: Secondary | ICD-10-CM | POA: Diagnosis not present

## 2017-10-13 DIAGNOSIS — J9610 Chronic respiratory failure, unspecified whether with hypoxia or hypercapnia: Secondary | ICD-10-CM | POA: Diagnosis not present

## 2017-10-15 DIAGNOSIS — J9610 Chronic respiratory failure, unspecified whether with hypoxia or hypercapnia: Secondary | ICD-10-CM | POA: Diagnosis not present

## 2017-10-15 DIAGNOSIS — C3490 Malignant neoplasm of unspecified part of unspecified bronchus or lung: Secondary | ICD-10-CM | POA: Diagnosis not present

## 2017-10-15 DIAGNOSIS — F0281 Dementia in other diseases classified elsewhere with behavioral disturbance: Secondary | ICD-10-CM | POA: Diagnosis not present

## 2017-10-15 DIAGNOSIS — E46 Unspecified protein-calorie malnutrition: Secondary | ICD-10-CM | POA: Diagnosis not present

## 2017-10-15 DIAGNOSIS — Z86718 Personal history of other venous thrombosis and embolism: Secondary | ICD-10-CM | POA: Diagnosis not present

## 2017-10-15 DIAGNOSIS — J849 Interstitial pulmonary disease, unspecified: Secondary | ICD-10-CM | POA: Diagnosis not present

## 2017-10-17 DIAGNOSIS — C3490 Malignant neoplasm of unspecified part of unspecified bronchus or lung: Secondary | ICD-10-CM | POA: Diagnosis not present

## 2017-10-17 DIAGNOSIS — E039 Hypothyroidism, unspecified: Secondary | ICD-10-CM | POA: Diagnosis not present

## 2017-10-17 DIAGNOSIS — R630 Anorexia: Secondary | ICD-10-CM | POA: Diagnosis not present

## 2017-10-17 DIAGNOSIS — I10 Essential (primary) hypertension: Secondary | ICD-10-CM | POA: Diagnosis not present

## 2017-10-17 DIAGNOSIS — R0902 Hypoxemia: Secondary | ICD-10-CM | POA: Diagnosis not present

## 2017-10-17 DIAGNOSIS — F0281 Dementia in other diseases classified elsewhere with behavioral disturbance: Secondary | ICD-10-CM | POA: Diagnosis not present

## 2017-10-17 DIAGNOSIS — J9610 Chronic respiratory failure, unspecified whether with hypoxia or hypercapnia: Secondary | ICD-10-CM | POA: Diagnosis not present

## 2017-10-17 DIAGNOSIS — E46 Unspecified protein-calorie malnutrition: Secondary | ICD-10-CM | POA: Diagnosis not present

## 2017-10-17 DIAGNOSIS — R634 Abnormal weight loss: Secondary | ICD-10-CM | POA: Diagnosis not present

## 2017-10-17 DIAGNOSIS — F339 Major depressive disorder, recurrent, unspecified: Secondary | ICD-10-CM | POA: Diagnosis not present

## 2017-10-17 DIAGNOSIS — E785 Hyperlipidemia, unspecified: Secondary | ICD-10-CM | POA: Diagnosis not present

## 2017-10-17 DIAGNOSIS — R569 Unspecified convulsions: Secondary | ICD-10-CM | POA: Diagnosis not present

## 2017-10-17 DIAGNOSIS — Z86711 Personal history of pulmonary embolism: Secondary | ICD-10-CM | POA: Diagnosis not present

## 2017-10-17 DIAGNOSIS — Z86718 Personal history of other venous thrombosis and embolism: Secondary | ICD-10-CM | POA: Diagnosis not present

## 2017-10-17 DIAGNOSIS — J849 Interstitial pulmonary disease, unspecified: Secondary | ICD-10-CM | POA: Diagnosis not present

## 2017-10-20 DIAGNOSIS — E46 Unspecified protein-calorie malnutrition: Secondary | ICD-10-CM | POA: Diagnosis not present

## 2017-10-20 DIAGNOSIS — Z86718 Personal history of other venous thrombosis and embolism: Secondary | ICD-10-CM | POA: Diagnosis not present

## 2017-10-20 DIAGNOSIS — C3490 Malignant neoplasm of unspecified part of unspecified bronchus or lung: Secondary | ICD-10-CM | POA: Diagnosis not present

## 2017-10-20 DIAGNOSIS — F0281 Dementia in other diseases classified elsewhere with behavioral disturbance: Secondary | ICD-10-CM | POA: Diagnosis not present

## 2017-10-20 DIAGNOSIS — J849 Interstitial pulmonary disease, unspecified: Secondary | ICD-10-CM | POA: Diagnosis not present

## 2017-10-20 DIAGNOSIS — J9610 Chronic respiratory failure, unspecified whether with hypoxia or hypercapnia: Secondary | ICD-10-CM | POA: Diagnosis not present

## 2017-10-22 DIAGNOSIS — C3490 Malignant neoplasm of unspecified part of unspecified bronchus or lung: Secondary | ICD-10-CM | POA: Diagnosis not present

## 2017-10-22 DIAGNOSIS — E46 Unspecified protein-calorie malnutrition: Secondary | ICD-10-CM | POA: Diagnosis not present

## 2017-10-22 DIAGNOSIS — J9610 Chronic respiratory failure, unspecified whether with hypoxia or hypercapnia: Secondary | ICD-10-CM | POA: Diagnosis not present

## 2017-10-22 DIAGNOSIS — F0281 Dementia in other diseases classified elsewhere with behavioral disturbance: Secondary | ICD-10-CM | POA: Diagnosis not present

## 2017-10-22 DIAGNOSIS — Z86718 Personal history of other venous thrombosis and embolism: Secondary | ICD-10-CM | POA: Diagnosis not present

## 2017-10-22 DIAGNOSIS — J849 Interstitial pulmonary disease, unspecified: Secondary | ICD-10-CM | POA: Diagnosis not present

## 2017-10-27 DIAGNOSIS — F0281 Dementia in other diseases classified elsewhere with behavioral disturbance: Secondary | ICD-10-CM | POA: Diagnosis not present

## 2017-10-27 DIAGNOSIS — J9610 Chronic respiratory failure, unspecified whether with hypoxia or hypercapnia: Secondary | ICD-10-CM | POA: Diagnosis not present

## 2017-10-27 DIAGNOSIS — J849 Interstitial pulmonary disease, unspecified: Secondary | ICD-10-CM | POA: Diagnosis not present

## 2017-10-27 DIAGNOSIS — C3490 Malignant neoplasm of unspecified part of unspecified bronchus or lung: Secondary | ICD-10-CM | POA: Diagnosis not present

## 2017-10-27 DIAGNOSIS — Z86718 Personal history of other venous thrombosis and embolism: Secondary | ICD-10-CM | POA: Diagnosis not present

## 2017-10-27 DIAGNOSIS — E46 Unspecified protein-calorie malnutrition: Secondary | ICD-10-CM | POA: Diagnosis not present

## 2017-10-29 DIAGNOSIS — E46 Unspecified protein-calorie malnutrition: Secondary | ICD-10-CM | POA: Diagnosis not present

## 2017-10-29 DIAGNOSIS — F0281 Dementia in other diseases classified elsewhere with behavioral disturbance: Secondary | ICD-10-CM | POA: Diagnosis not present

## 2017-10-29 DIAGNOSIS — J9610 Chronic respiratory failure, unspecified whether with hypoxia or hypercapnia: Secondary | ICD-10-CM | POA: Diagnosis not present

## 2017-10-29 DIAGNOSIS — J849 Interstitial pulmonary disease, unspecified: Secondary | ICD-10-CM | POA: Diagnosis not present

## 2017-10-29 DIAGNOSIS — C3490 Malignant neoplasm of unspecified part of unspecified bronchus or lung: Secondary | ICD-10-CM | POA: Diagnosis not present

## 2017-10-29 DIAGNOSIS — Z86718 Personal history of other venous thrombosis and embolism: Secondary | ICD-10-CM | POA: Diagnosis not present

## 2017-10-30 DIAGNOSIS — C3490 Malignant neoplasm of unspecified part of unspecified bronchus or lung: Secondary | ICD-10-CM | POA: Diagnosis not present

## 2017-10-30 DIAGNOSIS — Z86718 Personal history of other venous thrombosis and embolism: Secondary | ICD-10-CM | POA: Diagnosis not present

## 2017-10-30 DIAGNOSIS — J9610 Chronic respiratory failure, unspecified whether with hypoxia or hypercapnia: Secondary | ICD-10-CM | POA: Diagnosis not present

## 2017-10-30 DIAGNOSIS — E46 Unspecified protein-calorie malnutrition: Secondary | ICD-10-CM | POA: Diagnosis not present

## 2017-10-30 DIAGNOSIS — J849 Interstitial pulmonary disease, unspecified: Secondary | ICD-10-CM | POA: Diagnosis not present

## 2017-10-30 DIAGNOSIS — F0281 Dementia in other diseases classified elsewhere with behavioral disturbance: Secondary | ICD-10-CM | POA: Diagnosis not present

## 2017-11-02 ENCOUNTER — Non-Acute Institutional Stay (SKILLED_NURSING_FACILITY): Payer: Medicare Other | Admitting: Family

## 2017-11-02 ENCOUNTER — Encounter: Payer: Self-pay | Admitting: Family

## 2017-11-02 DIAGNOSIS — F0391 Unspecified dementia with behavioral disturbance: Secondary | ICD-10-CM | POA: Diagnosis not present

## 2017-11-02 DIAGNOSIS — R627 Adult failure to thrive: Secondary | ICD-10-CM | POA: Diagnosis not present

## 2017-11-02 DIAGNOSIS — J9611 Chronic respiratory failure with hypoxia: Secondary | ICD-10-CM

## 2017-11-02 DIAGNOSIS — G40909 Epilepsy, unspecified, not intractable, without status epilepticus: Secondary | ICD-10-CM

## 2017-11-02 DIAGNOSIS — E039 Hypothyroidism, unspecified: Secondary | ICD-10-CM | POA: Diagnosis not present

## 2017-11-02 NOTE — Progress Notes (Signed)
Location:  [place] Room Number: 25 Place of Service:  SNF (31) Provider: Dinah [name] FNP-C   [name], MD  Patient Care Team: [name], MD as PCP - General (Internal Medicine) [name], [facility] [name], MD as Consulting Physician (Orthopedic Surgery) [name], MD as Consulting Physician (Physical Medicine and Rehabilitation) Melina Schools, MD as Consulting Physician (Orthopedic Surgery) [name], MD as Consulting Physician (Otolaryngology) [name], [name], NP as Nurse Practitioner (Family Medicine)  Extended Emergency Contact Information Primary Emergency Contact: [name] Address: [address]          [name] of [place] Phone: [phone] Relation: Son Secondary Emergency Contact: [name] [place] of [place] Phone: [phone] Mobile Phone: [phone] Relation: Daughter  Code Status: DNR Goals of care: Advanced Directive information Advanced Directives 11/02/2017  Does Patient Have a Medical Advance Directive? Yes  Type of Paramedic of [place];Out of facility DNR (pink MOST or yellow form);Living will  Does patient want to make changes to medical advance directive? -  Copy of [place] in Chart? Yes  Pre-existing out of facility DNR order (yellow form or pink MOST form) Yellow form placed in chart (order not valid for inpatient use);Pink MOST form placed in chart (order not valid for inpatient use)     Chief Complaint  Patient presents with  . Medical Management of Chronic Issues    also has eloped numerous times    HPI:  Pt is a 84 y.o. female seen today [place] for medical management of chronic diseases.She has a medical history of hypothyroidism,seizure disorder,Squamous NSCLC,GERD,Dementia with behavioral disturbance,OA,Failure to thrive among other conditions.she is seen in her room today.she is unable  to provide HPI and ROS information due to her cognitive impairment.Facility Nurse reports patient was observed on the bathroom floor sustained right elbow skin tear.Her weight log reviewed continues to loss weight: wt 117.9 lbs (07/21/2017);wt 107.7 lbs ( 08/18/2017);wt 107.1 lbs ( 09/16/2017);wt 106.5 lbs ( 10/21/2017).she eats 25-50% of meals and drinks protein supplements.   Past Medical History:  Diagnosis Date  . Anemia, unspecified 03/18/2011  . Anxiety state, unspecified 01/2011  . Blood in stool 06/15/2012  . Corns and callosities 07/01/2011  . Dementia   . Diverticulosis 02/2011  . Dizziness   . DJD (degenerative joint disease)   . Dysuria 06/17/2011  . Hemorrhoids 02/2011  . History of radiation therapy 03/10/17-04/22/17   left lung 60 Gy in 30 fractions  . Low back pain   . Lung cancer ([place])    left  . Mitral valve problem thickened   thickened  . Osteoarthritis of both hands 10/09/2015  . Osteoporosis 02/2011  . Other abnormal blood chemistry 0/30/2012  . Other acquired deformity of toe 12/16/2011  . Pain in joint, site unspecified 01/2012  . 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 Sacroiliitis, not elsewhere classified ([place]) 05/2011  . Seizures ([place])    Remotely  . Unspecified hypothyroidism 01/2011  . 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 Vitamin D deficiency 01/2011   Past Surgical History:  Procedure Laterality Date  . bletheroplasty    . CATARACT EXTRACTION W/ INTRAOCULAR LENS  IMPLANT, BILATERAL  2010  . CERVICAL LAMINECTOMY  2005  . CYSTOSCOPY WITH RETROGRADE PYELOGRAM, URETEROSCOPY AND STENT PLACEMENT Bilateral 11/16/2016   Procedure: CYSTOSCOPY WITH RETROGRADE PYELOGRAMS/CLOT EVACUATION;  Surgeon: Irine Seal, MD;  Location: [facility];  Service: Urology;  Laterality: Bilateral;  . HAMMER TOE SURGERY  2013   right 2nd toe Dr. Mallie Mussel  . IVC FILTER PLACEMENT ([facility] HX)    . JOINT REPLACEMENT Bilateral 2002 Right, 1992 Left    knees  . TRANSURETHRAL RESECTION OF BLADDER TUMOR N/A 11/16/2016   Procedure: TRANSURETHRAL RESECTION OF BLADDER TUMOR (TURBT);  Surgeon: Irine Seal, MD;  Location[facility];  Service: Urology;  Laterality: N/A;    Allergies  Allergen Reactions  . Macrodantin [Nitrofurantoin]   . Morphine And Related Other (See Comments)    Altered mental state   . Sulfa Antibiotics Other (See Comments)    Reaction: unknown    Allergies as of 11/02/2017      Reactions   Macrodantin [nitrofurantoin]    Morphine And Related Other (See Comments)   Altered mental state    Sulfa Antibiotics Other (See Comments)   Reaction: unknown      Medication List        Accurate as of 11/02/17  6:15 PM. Always use your most recent med list.          acetaminophen 325 MG tablet Commonly known as:  TYLENOL Take 650 mg by mouth 2 (two) times daily as needed.   acetaminophen 325 MG tablet Commonly known as:  TYLENOL Take 650 mg by mouth 2 (two) times daily.   ipratropium-albuterol 0.5-2.5 (3) MG/3ML Soln Commonly known as:  DUONEB Take 3 mLs by nebulization every 6 (six) hours as needed.   levothyroxine 100 MCG tablet Commonly known as:  SYNTHROID, LEVOTHROID Take 1 tablet (100 mcg total) by mouth daily before breakfast. For thyroid   LORazepam 1 MG tablet Commonly known as:  ATIVAN Take 1 mg by mouth every 6 (six) hours.   magnesium hydroxide 400 MG/5ML suspension Commonly known as:  MILK OF MAGNESIA Take 30 mLs by mouth daily as needed for mild constipation.   mirtazapine 15 MG tablet Commonly known as:  REMERON Take 15 mg by mouth at bedtime.   morphine CONCENTRATE 10 mg / 0.5 ml concentrated solution Take 5 mg by mouth every 4 (four) hours as needed for severe pain or shortness of breath.   OXYGEN Inhale 2 L/min into the lungs as needed. To keep O2 Sat >90%   phenytoin 100 MG ER capsule Commonly known as:  DILANTIN Take 100 mg by mouth daily.   phenytoin 200 MG ER capsule Commonly  known as:  DILANTIN Take 200 mg by mouth at bedtime.   QUEtiapine 100 MG tablet Commonly known as:  SEROQUEL Take 100 mg by mouth at bedtime.   RESOURCE 2.0 PO Take 120 mLs by mouth See admin instructions. Give with each med pass   umeclidinium-vilanterol 62.5-25 MCG/INH Aepb Commonly known as:  ANORO ELLIPTA Inhale 1 puff into the lungs daily.       Review of Systems  Unable to perform ROS: Dementia (additional information provided by facility nurse )    Immunization History  Administered Date(s) Administered  . Influenza Split 02/18/2017  . Influenza Whole 03/17/2007, 02/16/2009, 01/30/2010, 02/16/2013  . Influenza-Unspecified 03/02/2014, 02/22/2015, 02/28/2016  . PPD Test 07/09/2010, 02/04/2015, 04/04/2015, 06/04/2015  . Pneumococcal Conjugate-13 01/13/2017  . Pneumococcal Polysaccharide-23 05/19/2005, 05/23/2013  . Td 05/19/2005  . Tetanus 10/06/2012  . Zoster 05/19/2012   Pertinent  Health Maintenance Due  Topic Date Due  . DEXA SCAN  11/19/1994  . INFLUENZA VACCINE  02/25/2018 (Originally 12/17/2017)  . PNA vac Low Risk Adult  Completed  Vitals:   11/02/17 1510  BP: 125/65  Pulse: 72  Resp: 19  Temp: (!) 97.2 F (36.2 C)  SpO2: 90%  Weight: 106 lb 8 oz (48.3 kg)  Height: 5\' 7"  (1.702 m)   wt 117.9 lbs (07/21/2017) wt 107.7 lbs ( 08/18/2017) wt 107.1 lbs ( 09/16/2017) wt 106.5 lbs ( 10/21/2017)  Body mass index is 16.68 kg/m. Physical Exam  Constitutional:  Thin frail,elderly in no acute distress   HENT:  Head: Normocephalic.  Right Ear: External ear normal.  Left Ear: External ear normal.  Mouth/Throat: Oropharynx is clear and moist. No oropharyngeal exudate.  Eyes: Pupils are equal, round, and reactive to light. Conjunctivae are normal. Right eye exhibits no discharge. Left eye exhibits no discharge. No scleral icterus.  Eye glasses in place   Neck: Normal range of motion. No JVD present. No thyromegaly present.  Cardiovascular: Intact distal  pulses. Exam reveals no gallop and no friction rub.  Murmur heard. Pulmonary/Chest: No respiratory distress. She has no wheezes. She has no rales.  Poor air entry   Abdominal: Soft. Bowel sounds are normal. She exhibits no distension and no mass. There is no tenderness. There is no rebound and no guarding.  Musculoskeletal: She exhibits no edema or tenderness.  Moves x 4 extremities.Unsteady gait ambulates with front wheel walker and self propel wheelchair with her legs at times.  Lymphadenopathy:    She has no cervical adenopathy.  Neurological: She is alert. Gait abnormal.  Confused   Skin: Skin is warm and dry. No rash noted. No erythema. No pallor.  Right elbow skin tear wound bed red surrounding skin tissue without any signs of infection.No dressing in place.   Psychiatric: She has a normal mood and affect. Her behavior is normal. Cognition and memory are impaired.    Labs reviewed: Recent Labs    11/17/16 0454  04/08/17 1506 04/08/17 2336  04/30/17 0940 05/15/17 1136 06/11/17  NA 140   < > 139 138   < > 142 140 145  145  K 3.6   < > 5.0 4.4   < > 4.5 4.7 4.3  4.3  CL 103  --  104 110  --   --   --   --   CO2 31   < > 28 22  --  29 27  --   GLUCOSE 95   < > 157* 138*  --  92 93  --   BUN 10   < > 33* 26*   < > 31.3* 24.3 29*  29*  CREATININE 0.53   < > 0.78 0.55   < > 0.9 0.9 0.6  0.63  CALCIUM 8.5*   < > 8.9 7.7*  --  9.6 9.5 9.1   < > = values in this interval not displayed.   Recent Labs    04/30/17 0940 05/15/17 1136 06/11/17  AST 19 16 14  14   ALT 11 12 8  8   ALKPHOS 81 75 76  76  BILITOT 0.30 0.22 0.3  PROT 7.4 7.5 6.7  ALBUMIN 3.4* 2.8* 3.2   Recent Labs    04/08/17 1506  04/11/17 0636  04/30/17 0940 05/15/17 1136 06/11/17 06/15/17  WBC 9.3   < > 3.8*   < > 7.1 9.1 13.1 9.2  NEUTROABS 8.3*  --   --   --  5.8 7.1*  --   --   HGB 10.9*   < > 9.7*   < > 12.6 11.8  12.4 11.7*  HCT 32.9*   < > 29.3*   < > 39.6 37.0 37.1 35*  MCV 100.3*   < > 102.1*   --  106.5* 105.1*  --   --   PLT 82*   < > 79*   < > 208 342  --  259   < > = values in this interval not displayed.   Lab Results  Component Value Date   TSH 1.13 10/08/2017   Lab Results  Component Value Date   HGBA1C 5.4 03/08/2015   Lab Results  Component Value Date   CHOL 235 (H) 03/06/2009   HDL 108.40 03/06/2009   LDLDIRECT 108.2 03/06/2009    Significant Diagnostic Results in last 30 days:  No results found.  Assessment/Plan 1. Failure to thrive in adult Progressive weight loss.Has NSCL cancer completed chemotherapy and Radiation in 2018.continue to encourage oral intake and protein supplements.continue on Remeron 15 mg tablet at bedtime for appetite follow up with facility registered Dietician.continue to follow up with Hospice service.    2. Hypothyroidism Lab Results  Component Value Date   TSH 1.13 10/08/2017  Continue on Levothyroxine 100 mcg tablet daily.Monitor TSH level.   3. Dementia with behavioral disturbance Calm during visit but continues to be high risk for elopement went out of the facility doors over the weekend.continue on Ativan 1 mg tablet every 6 hours as needed,Remeron 15 mg tablet at bedtime and Seroquel 100 mg tablet at bedtime.continue to re-orient.Hospice service provider to re-evaluate for increased confusion and agitation. Wander guard in place.   4. Seizure disorder  No seizure activity reported.continue on Dilantin 200 mg QHS and 100 mg capsule daily.  5. Chronic respiratory failure with hypoxia Breathing stable during visit.Has oxygen via nasal cannula but takes it off.some shortness of breath with exertion. Continue on Morphine oral solution as every 4 hours needed for shortness of breath.continue on Anoro Ellipta one puff daily and Duoneb 0.5-2.5/3ml soln every 6 hours as needed for shortness of breath or wheezing. Continue on Hospice service due to progressive decline and squamous NSCLC.  Family/ staff Communication: Reviewed plan of  care with Dr. Mikeal Hawthorne and facility Nurse.   Labs/tests ordered: None   Dinah C [name], NP

## 2017-11-05 DIAGNOSIS — E46 Unspecified protein-calorie malnutrition: Secondary | ICD-10-CM | POA: Diagnosis not present

## 2017-11-05 DIAGNOSIS — Z86718 Personal history of other venous thrombosis and embolism: Secondary | ICD-10-CM | POA: Diagnosis not present

## 2017-11-05 DIAGNOSIS — C3490 Malignant neoplasm of unspecified part of unspecified bronchus or lung: Secondary | ICD-10-CM | POA: Diagnosis not present

## 2017-11-05 DIAGNOSIS — J9610 Chronic respiratory failure, unspecified whether with hypoxia or hypercapnia: Secondary | ICD-10-CM | POA: Diagnosis not present

## 2017-11-05 DIAGNOSIS — J849 Interstitial pulmonary disease, unspecified: Secondary | ICD-10-CM | POA: Diagnosis not present

## 2017-11-05 DIAGNOSIS — F0281 Dementia in other diseases classified elsewhere with behavioral disturbance: Secondary | ICD-10-CM | POA: Diagnosis not present

## 2017-11-06 DIAGNOSIS — F0281 Dementia in other diseases classified elsewhere with behavioral disturbance: Secondary | ICD-10-CM | POA: Diagnosis not present

## 2017-11-06 DIAGNOSIS — E46 Unspecified protein-calorie malnutrition: Secondary | ICD-10-CM | POA: Diagnosis not present

## 2017-11-06 DIAGNOSIS — J9610 Chronic respiratory failure, unspecified whether with hypoxia or hypercapnia: Secondary | ICD-10-CM | POA: Diagnosis not present

## 2017-11-06 DIAGNOSIS — Z86718 Personal history of other venous thrombosis and embolism: Secondary | ICD-10-CM | POA: Diagnosis not present

## 2017-11-06 DIAGNOSIS — J849 Interstitial pulmonary disease, unspecified: Secondary | ICD-10-CM | POA: Diagnosis not present

## 2017-11-06 DIAGNOSIS — C3490 Malignant neoplasm of unspecified part of unspecified bronchus or lung: Secondary | ICD-10-CM | POA: Diagnosis not present

## 2017-11-09 DIAGNOSIS — E46 Unspecified protein-calorie malnutrition: Secondary | ICD-10-CM | POA: Diagnosis not present

## 2017-11-09 DIAGNOSIS — J9610 Chronic respiratory failure, unspecified whether with hypoxia or hypercapnia: Secondary | ICD-10-CM | POA: Diagnosis not present

## 2017-11-09 DIAGNOSIS — F0281 Dementia in other diseases classified elsewhere with behavioral disturbance: Secondary | ICD-10-CM | POA: Diagnosis not present

## 2017-11-09 DIAGNOSIS — C3490 Malignant neoplasm of unspecified part of unspecified bronchus or lung: Secondary | ICD-10-CM | POA: Diagnosis not present

## 2017-11-09 DIAGNOSIS — J849 Interstitial pulmonary disease, unspecified: Secondary | ICD-10-CM | POA: Diagnosis not present

## 2017-11-09 DIAGNOSIS — Z86718 Personal history of other venous thrombosis and embolism: Secondary | ICD-10-CM | POA: Diagnosis not present

## 2017-11-11 DIAGNOSIS — J849 Interstitial pulmonary disease, unspecified: Secondary | ICD-10-CM | POA: Diagnosis not present

## 2017-11-11 DIAGNOSIS — Z86718 Personal history of other venous thrombosis and embolism: Secondary | ICD-10-CM | POA: Diagnosis not present

## 2017-11-11 DIAGNOSIS — C3490 Malignant neoplasm of unspecified part of unspecified bronchus or lung: Secondary | ICD-10-CM | POA: Diagnosis not present

## 2017-11-11 DIAGNOSIS — E46 Unspecified protein-calorie malnutrition: Secondary | ICD-10-CM | POA: Diagnosis not present

## 2017-11-11 DIAGNOSIS — J9610 Chronic respiratory failure, unspecified whether with hypoxia or hypercapnia: Secondary | ICD-10-CM | POA: Diagnosis not present

## 2017-11-11 DIAGNOSIS — F0281 Dementia in other diseases classified elsewhere with behavioral disturbance: Secondary | ICD-10-CM | POA: Diagnosis not present

## 2017-11-12 DIAGNOSIS — Z86718 Personal history of other venous thrombosis and embolism: Secondary | ICD-10-CM | POA: Diagnosis not present

## 2017-11-12 DIAGNOSIS — F0281 Dementia in other diseases classified elsewhere with behavioral disturbance: Secondary | ICD-10-CM | POA: Diagnosis not present

## 2017-11-12 DIAGNOSIS — E46 Unspecified protein-calorie malnutrition: Secondary | ICD-10-CM | POA: Diagnosis not present

## 2017-11-12 DIAGNOSIS — J9610 Chronic respiratory failure, unspecified whether with hypoxia or hypercapnia: Secondary | ICD-10-CM | POA: Diagnosis not present

## 2017-11-12 DIAGNOSIS — J849 Interstitial pulmonary disease, unspecified: Secondary | ICD-10-CM | POA: Diagnosis not present

## 2017-11-12 DIAGNOSIS — C3490 Malignant neoplasm of unspecified part of unspecified bronchus or lung: Secondary | ICD-10-CM | POA: Diagnosis not present

## 2017-11-16 DIAGNOSIS — R0902 Hypoxemia: Secondary | ICD-10-CM | POA: Diagnosis not present

## 2017-11-16 DIAGNOSIS — J849 Interstitial pulmonary disease, unspecified: Secondary | ICD-10-CM | POA: Diagnosis not present

## 2017-11-16 DIAGNOSIS — E46 Unspecified protein-calorie malnutrition: Secondary | ICD-10-CM | POA: Diagnosis not present

## 2017-11-16 DIAGNOSIS — F0281 Dementia in other diseases classified elsewhere with behavioral disturbance: Secondary | ICD-10-CM | POA: Diagnosis not present

## 2017-11-16 DIAGNOSIS — E785 Hyperlipidemia, unspecified: Secondary | ICD-10-CM | POA: Diagnosis not present

## 2017-11-16 DIAGNOSIS — Z86711 Personal history of pulmonary embolism: Secondary | ICD-10-CM | POA: Diagnosis not present

## 2017-11-16 DIAGNOSIS — R634 Abnormal weight loss: Secondary | ICD-10-CM | POA: Diagnosis not present

## 2017-11-16 DIAGNOSIS — J9610 Chronic respiratory failure, unspecified whether with hypoxia or hypercapnia: Secondary | ICD-10-CM | POA: Diagnosis not present

## 2017-11-16 DIAGNOSIS — F339 Major depressive disorder, recurrent, unspecified: Secondary | ICD-10-CM | POA: Diagnosis not present

## 2017-11-16 DIAGNOSIS — I10 Essential (primary) hypertension: Secondary | ICD-10-CM | POA: Diagnosis not present

## 2017-11-16 DIAGNOSIS — R630 Anorexia: Secondary | ICD-10-CM | POA: Diagnosis not present

## 2017-11-16 DIAGNOSIS — E039 Hypothyroidism, unspecified: Secondary | ICD-10-CM | POA: Diagnosis not present

## 2017-11-16 DIAGNOSIS — Z86718 Personal history of other venous thrombosis and embolism: Secondary | ICD-10-CM | POA: Diagnosis not present

## 2017-11-16 DIAGNOSIS — R569 Unspecified convulsions: Secondary | ICD-10-CM | POA: Diagnosis not present

## 2017-11-16 DIAGNOSIS — C3490 Malignant neoplasm of unspecified part of unspecified bronchus or lung: Secondary | ICD-10-CM | POA: Diagnosis not present

## 2017-11-18 DIAGNOSIS — C3490 Malignant neoplasm of unspecified part of unspecified bronchus or lung: Secondary | ICD-10-CM | POA: Diagnosis not present

## 2017-11-18 DIAGNOSIS — J9610 Chronic respiratory failure, unspecified whether with hypoxia or hypercapnia: Secondary | ICD-10-CM | POA: Diagnosis not present

## 2017-11-18 DIAGNOSIS — E46 Unspecified protein-calorie malnutrition: Secondary | ICD-10-CM | POA: Diagnosis not present

## 2017-11-18 DIAGNOSIS — Z86718 Personal history of other venous thrombosis and embolism: Secondary | ICD-10-CM | POA: Diagnosis not present

## 2017-11-18 DIAGNOSIS — F0281 Dementia in other diseases classified elsewhere with behavioral disturbance: Secondary | ICD-10-CM | POA: Diagnosis not present

## 2017-11-18 DIAGNOSIS — J849 Interstitial pulmonary disease, unspecified: Secondary | ICD-10-CM | POA: Diagnosis not present

## 2017-11-19 DIAGNOSIS — C3490 Malignant neoplasm of unspecified part of unspecified bronchus or lung: Secondary | ICD-10-CM | POA: Diagnosis not present

## 2017-11-19 DIAGNOSIS — Z86718 Personal history of other venous thrombosis and embolism: Secondary | ICD-10-CM | POA: Diagnosis not present

## 2017-11-19 DIAGNOSIS — J9610 Chronic respiratory failure, unspecified whether with hypoxia or hypercapnia: Secondary | ICD-10-CM | POA: Diagnosis not present

## 2017-11-19 DIAGNOSIS — F0281 Dementia in other diseases classified elsewhere with behavioral disturbance: Secondary | ICD-10-CM | POA: Diagnosis not present

## 2017-11-19 DIAGNOSIS — E46 Unspecified protein-calorie malnutrition: Secondary | ICD-10-CM | POA: Diagnosis not present

## 2017-11-19 DIAGNOSIS — J849 Interstitial pulmonary disease, unspecified: Secondary | ICD-10-CM | POA: Diagnosis not present

## 2017-11-20 DIAGNOSIS — J849 Interstitial pulmonary disease, unspecified: Secondary | ICD-10-CM | POA: Diagnosis not present

## 2017-11-20 DIAGNOSIS — C3490 Malignant neoplasm of unspecified part of unspecified bronchus or lung: Secondary | ICD-10-CM | POA: Diagnosis not present

## 2017-11-20 DIAGNOSIS — E46 Unspecified protein-calorie malnutrition: Secondary | ICD-10-CM | POA: Diagnosis not present

## 2017-11-20 DIAGNOSIS — F0281 Dementia in other diseases classified elsewhere with behavioral disturbance: Secondary | ICD-10-CM | POA: Diagnosis not present

## 2017-11-20 DIAGNOSIS — Z86718 Personal history of other venous thrombosis and embolism: Secondary | ICD-10-CM | POA: Diagnosis not present

## 2017-11-20 DIAGNOSIS — J9610 Chronic respiratory failure, unspecified whether with hypoxia or hypercapnia: Secondary | ICD-10-CM | POA: Diagnosis not present

## 2017-11-21 DIAGNOSIS — J9610 Chronic respiratory failure, unspecified whether with hypoxia or hypercapnia: Secondary | ICD-10-CM | POA: Diagnosis not present

## 2017-11-21 DIAGNOSIS — C3490 Malignant neoplasm of unspecified part of unspecified bronchus or lung: Secondary | ICD-10-CM | POA: Diagnosis not present

## 2017-11-21 DIAGNOSIS — F0281 Dementia in other diseases classified elsewhere with behavioral disturbance: Secondary | ICD-10-CM | POA: Diagnosis not present

## 2017-11-21 DIAGNOSIS — E46 Unspecified protein-calorie malnutrition: Secondary | ICD-10-CM | POA: Diagnosis not present

## 2017-11-21 DIAGNOSIS — J849 Interstitial pulmonary disease, unspecified: Secondary | ICD-10-CM | POA: Diagnosis not present

## 2017-11-21 DIAGNOSIS — Z86718 Personal history of other venous thrombosis and embolism: Secondary | ICD-10-CM | POA: Diagnosis not present

## 2017-11-23 ENCOUNTER — Encounter: Payer: Self-pay | Admitting: Family

## 2017-11-23 ENCOUNTER — Non-Acute Institutional Stay (SKILLED_NURSING_FACILITY): Payer: Medicare Other | Admitting: Family

## 2017-11-23 DIAGNOSIS — J849 Interstitial pulmonary disease, unspecified: Secondary | ICD-10-CM | POA: Diagnosis not present

## 2017-11-23 DIAGNOSIS — F0391 Unspecified dementia with behavioral disturbance: Secondary | ICD-10-CM

## 2017-11-23 DIAGNOSIS — J9610 Chronic respiratory failure, unspecified whether with hypoxia or hypercapnia: Secondary | ICD-10-CM | POA: Diagnosis not present

## 2017-11-23 DIAGNOSIS — R451 Restlessness and agitation: Secondary | ICD-10-CM

## 2017-11-23 DIAGNOSIS — C3490 Malignant neoplasm of unspecified part of unspecified bronchus or lung: Secondary | ICD-10-CM | POA: Diagnosis not present

## 2017-11-23 DIAGNOSIS — Z86718 Personal history of other venous thrombosis and embolism: Secondary | ICD-10-CM | POA: Diagnosis not present

## 2017-11-23 DIAGNOSIS — F0281 Dementia in other diseases classified elsewhere with behavioral disturbance: Secondary | ICD-10-CM | POA: Diagnosis not present

## 2017-11-23 DIAGNOSIS — E46 Unspecified protein-calorie malnutrition: Secondary | ICD-10-CM | POA: Diagnosis not present

## 2017-11-23 NOTE — Progress Notes (Signed)
Location:  [place] Room Number: 25 Place of Service:  SNF (31) Provider: Taliana Mersereau FNP-C  [name], MD  Patient Care Team: [name], MD as PCP - General (Internal Medicine) [name], [facility] [name], MD as Consulting Physician (Orthopedic Surgery) [name], MD as Consulting Physician (Physical Medicine and Rehabilitation) Melina Schools, MD as Consulting Physician (Orthopedic Surgery) [name], MD as Consulting Physician (Otolaryngology) Carsen Leaf, [name], NP as Nurse Practitioner (Family Medicine)  Extended Emergency Contact Information Primary Emergency Contact: [name] Address: [address]          [name] of [place] Phone: [phone] Relation: Son Secondary Emergency Contact: [name] [place] of [place] Phone: [phone] Mobile Phone: [phone] Relation: Daughter  Code Status:  DNR Goals of care: Advanced Directive information Advanced Directives 11/23/2017  Does Patient Have a Medical Advance Directive? Yes  Type of Paramedic of [place];Out of facility DNR (pink MOST or yellow form);Living will  Does patient want to make changes to medical advance directive? -  Copy of [place] in Chart? Yes  Pre-existing out of facility DNR order (yellow form or pink MOST form) Yellow form placed in chart (order not valid for inpatient use);Pink MOST form placed in chart (order not valid for inpatient use)     Chief Complaint  Patient presents with  . Acute Visit    behavior changes    HPI:  Pt is a 84 y.o. female seen today at [facility] for an acute visit for evaluation of increased agitation.she is seen in her room today with facility Nurse supervisor present at bedside.Nurse reports patient has had increased agitation,yelling and combative at times.Nurse also reports via SBAR patient continues to try and exit the  facility.On call provider was notified over the weekend 11/21/2017 haldol 1 mg I.m x 1 dose was ordered.Hospice provider was ordered haldol 5 mg I.m every 4 hours as needed and clonazepam 2 mg tablet every 8 hours.Nurse states patient has had no agitation this morning and sleeping more.she has required x 2 assistance to walk to the bathroom.Nurse also states patient didn't eat her breakfast this morning due to sleepiness.   Past Medical History:  Diagnosis Date  . Anemia, unspecified 03/18/2011  . Anxiety state, unspecified 01/2011  . Blood in stool 06/15/2012  . Corns and callosities 07/01/2011  . Dementia   . Diverticulosis 02/2011  . Dizziness   . DJD (degenerative joint disease)   . Dysuria 06/17/2011  . Hemorrhoids 02/2011  . History of radiation therapy 03/10/17-04/22/17   left lung 60 Gy in 30 fractions  . Low back pain   . Lung cancer ([place])    left  . Mitral valve problem thickened   thickened  . Osteoarthritis of both hands 10/09/2015  . Osteoporosis 02/2011  . Other abnormal blood chemistry 0/30/2012  . Other acquired deformity of toe 12/16/2011  . Pain in joint, site unspecified 01/2012  . 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 Sacroiliitis, not elsewhere classified ([place]) 05/2011  . Seizures ([place])    Remotely  . Unspecified hypothyroidism 01/2011  . 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 Vitamin D deficiency 01/2011   Past Surgical History:  Procedure Laterality Date  . bletheroplasty    . CATARACT EXTRACTION W/ INTRAOCULAR LENS  IMPLANT, BILATERAL  2010  . CERVICAL LAMINECTOMY  2005  . CYSTOSCOPY WITH RETROGRADE PYELOGRAM, URETEROSCOPY  AND STENT PLACEMENT Bilateral 11/16/2016   Procedure: CYSTOSCOPY WITH RETROGRADE PYELOGRAMS/CLOT EVACUATION;  Surgeon: Irine Seal, MD;  [facility];  Service: Urology;  Laterality: Bilateral;  . HAMMER TOE SURGERY  2013   right 2nd toe Dr. Mallie Mussel  . IVC FILTER PLACEMENT ([facility] HX)    .  JOINT REPLACEMENT Bilateral 2002 Right, 1992 Left   knees  . TRANSURETHRAL RESECTION OF BLADDER TUMOR N/A 11/16/2016   Procedure: TRANSURETHRAL RESECTION OF BLADDER TUMOR (TURBT);  Surgeon: Irine Seal, MD;  [facility];  Service: Urology;  Laterality: N/A;    Allergies  Allergen Reactions  . Macrodantin [Nitrofurantoin]   . Morphine And Related Other (See Comments)    Altered mental state   . Sulfa Antibiotics Other (See Comments)    Reaction: unknown    Outpatient Encounter Medications as of 11/23/2017  Medication Sig  . acetaminophen (TYLENOL) 325 MG tablet Take 650 mg by mouth 2 (two) times daily as needed.   Marland Kitchen acetaminophen (TYLENOL) 325 MG tablet Take 650 mg by mouth 2 (two) times daily.  . clonazePAM (KLONOPIN) 0.5 MG tablet Take 0.25 mg by mouth every 8 (eight) hours.  Marland Kitchen ipratropium-albuterol (DUONEB) 0.5-2.5 (3) MG/3ML SOLN Take 3 mLs by nebulization every 6 (six) hours as needed.  Marland Kitchen levothyroxine (SYNTHROID, LEVOTHROID) 100 MCG tablet Take 1 tablet (100 mcg total) by mouth daily before breakfast. For thyroid  . magnesium hydroxide (MILK OF MAGNESIA) 400 MG/5ML suspension Take 30 mLs by mouth daily as needed for mild constipation.  . mirtazapine (REMERON) 15 MG tablet Take 15 mg by mouth at bedtime.  . Morphine Sulfate (MORPHINE CONCENTRATE) 10 mg / 0.5 ml concentrated solution Take 5 mg by mouth every 4 (four) hours as needed for severe pain or shortness of breath.  . Nutritional Supplements (BOOST PLUS PO) Take 1 Can by mouth 3 (three) times daily between meals.  . OXYGEN Inhale 2 L/min into the lungs as needed. To keep O2 Sat >90%  . phenytoin (DILANTIN) 100 MG ER capsule Take 100 mg by mouth daily.   . phenytoin (DILANTIN) 200 MG ER capsule Take 200 mg by mouth at bedtime.  Marland Kitchen QUEtiapine (SEROQUEL) 100 MG tablet Take 100 mg by mouth 2 (two) times daily.   Marland Kitchen umeclidinium-vilanterol (ANORO ELLIPTA) 62.5-25 MCG/INH AEPB Inhale 1 puff into the lungs daily.  . [DISCONTINUED]  LORazepam (ATIVAN) 1 MG tablet Take 1 mg by mouth every 6 (six) hours.  . [DISCONTINUED] Nutritional Supplements (RESOURCE 2.0 PO) Take 120 mLs by mouth See admin instructions. Give with each med pass   No facility-administered encounter medications on file as of 11/23/2017.     Review of Systems  Unable to perform ROS: Acuity of condition (additional information provided by facility Nurse )    Immunization History  Administered Date(s) Administered  . Influenza Split 02/18/2017  . Influenza Whole 03/17/2007, 02/16/2009, 01/30/2010, 02/16/2013  . Influenza-Unspecified 03/02/2014, 02/22/2015, 02/28/2016  . PPD Test 07/09/2010, 02/04/2015, 04/04/2015, 06/04/2015  . Pneumococcal Conjugate-13 01/13/2017  . Pneumococcal Polysaccharide-23 05/19/2005, 05/23/2013  . Td 05/19/2005  . Tetanus 10/06/2012  . Zoster 05/19/2012   Pertinent  Health Maintenance Due  Topic Date Due  . DEXA SCAN  11/19/1994  . INFLUENZA VACCINE  02/25/2018 (Originally 12/17/2017)  . PNA vac Low Risk Adult  Completed   Fall Risk  05/25/2017 03/13/2017 02/18/2017 01/05/2017 10/09/2015  Falls in the past year? Yes No Yes No No  Number falls in past yr: 1 - 2 or more - -  Injury with Fall? Yes - No - -  Comment - - - - -  Risk Factor Category  - - High Fall Risk - -  Risk for fall due to : History of fall(s) - - - -  Follow up Falls evaluation completed - - - -    Vitals:   11/23/17 1122  BP: (!) 93/58  Pulse: 76  Resp: 20  Temp: (!) 96.8 F (36 C)  SpO2: 90%  Weight: 103 lb 12.8 oz (47.1 kg)  Height: 5\' 7"  (1.702 m)   Body mass index is 16.26 kg/m. Physical Exam  Constitutional:  Thin frail elderly drowsiness during visit  HENT:  Head: Normocephalic.  Right Ear: External ear normal.  Left Ear: External ear normal.  Mouth/Throat: Oropharynx is clear and moist. No oropharyngeal exudate.  Eyes: Pupils are equal, round, and reactive to light. Conjunctivae are normal. Right eye exhibits no discharge. Left eye  exhibits no discharge. No scleral icterus.  Neck: Normal range of motion. No JVD present. No thyromegaly present.  Cardiovascular: Intact distal pulses. Exam reveals no gallop and no friction rub.  Murmur heard. Pulmonary/Chest: Effort normal. No respiratory distress. She has no wheezes. She has no rales.  Poor air entry   Abdominal: Soft. Bowel sounds are normal. She exhibits no distension and no mass. There is no tenderness. There is no rebound and no guarding.  Musculoskeletal: She exhibits no edema or tenderness.  Lymphadenopathy:    She has no cervical adenopathy.  Neurological:  Drowsy during visit   Skin: Skin is warm and dry. No rash noted. No erythema. No pallor.  Psychiatric: She has a normal mood and affect. Her behavior is normal. Cognition and memory are impaired.    Labs reviewed: Recent Labs    04/08/17 1506 04/08/17 2336  04/30/17 0940 05/15/17 1136 06/11/17  NA 139 138   < > 142 140 145  145  K 5.0 4.4   < > 4.5 4.7 4.3  4.3  CL 104 110  --   --   --   --   CO2 28 22  --  29 27  --   GLUCOSE 157* 138*  --  92 93  --   BUN 33* 26*   < > 31.3* 24.3 29*  29*  CREATININE 0.78 0.55   < > 0.9 0.9 0.6  0.63  CALCIUM 8.9 7.7*  --  9.6 9.5 9.1   < > = values in this interval not displayed.   Recent Labs    04/30/17 0940 05/15/17 1136 06/11/17  AST 19 16 14  14   ALT 11 12 8  8   ALKPHOS 81 75 76  76  BILITOT 0.30 0.22 0.3  PROT 7.4 7.5 6.7  ALBUMIN 3.4* 2.8* 3.2   Recent Labs    04/08/17 1506  04/11/17 0636  04/30/17 0940 05/15/17 1136 06/11/17 06/15/17  WBC 9.3   < > 3.8*   < > 7.1 9.1 13.1 9.2  NEUTROABS 8.3*  --   --   --  5.8 7.1*  --   --   HGB 10.9*   < > 9.7*   < > 12.6 11.8 12.4 11.7*  HCT 32.9*   < > 29.3*   < > 39.6 37.0 37.1 35*  MCV 100.3*   < > 102.1*  --  106.5* 105.1*  --   --   PLT 82*   < > 79*   < > 208 342  --  259   < > =  values in this interval not displayed.   Lab Results  Component Value Date   TSH 1.13 10/08/2017   Lab  Results  Component Value Date   HGBA1C 5.4 03/08/2015   Lab Results  Component Value Date   CHOL 235 (H) 03/06/2009   HDL 108.40 03/06/2009   LDLDIRECT 108.2 03/06/2009    Significant Diagnostic Results in last 30 days:  No results found.  Assessment/Plan 1. Agitation Afebrile.Agitation worst over the weekend.Haldol 5 mg I.m every 4 hours and clonazepam 2 mg tablet every 8 hours.No agitation noted by nursing.drowsy during the visit unable to eat her breakfast this morning due to sleepiness.will change Haldol 5 mg I.m every 4 hours to Haldol 5 mg/ml to inject 0.25 mg (0.5 ml) every 4 hours as needed for agitation and reduce clonazepam 2 mg tablet every 8 hours to 1 mg tablet every 8 hours.   2. Dementia with behavioral disturbance Recent increased behavioral issues over the weekend clonazepam and haldol ordered by hospice provider as above.Drowsy during visit.Adjust dosage as above.continue to monitor for behavioral changes.  Family/ staff Communication: Reviewed plan of care with patient and facility Nurse.  Labs/tests ordered: None   Kahliya Fraleigh C Krishon Adkison, NP

## 2017-11-27 DIAGNOSIS — C3490 Malignant neoplasm of unspecified part of unspecified bronchus or lung: Secondary | ICD-10-CM | POA: Diagnosis not present

## 2017-11-27 DIAGNOSIS — E46 Unspecified protein-calorie malnutrition: Secondary | ICD-10-CM | POA: Diagnosis not present

## 2017-11-27 DIAGNOSIS — Z86718 Personal history of other venous thrombosis and embolism: Secondary | ICD-10-CM | POA: Diagnosis not present

## 2017-11-27 DIAGNOSIS — F0281 Dementia in other diseases classified elsewhere with behavioral disturbance: Secondary | ICD-10-CM | POA: Diagnosis not present

## 2017-11-27 DIAGNOSIS — J9610 Chronic respiratory failure, unspecified whether with hypoxia or hypercapnia: Secondary | ICD-10-CM | POA: Diagnosis not present

## 2017-11-27 DIAGNOSIS — J849 Interstitial pulmonary disease, unspecified: Secondary | ICD-10-CM | POA: Diagnosis not present

## 2017-11-30 ENCOUNTER — Encounter: Payer: Self-pay | Admitting: Internal Medicine

## 2017-11-30 ENCOUNTER — Non-Acute Institutional Stay (SKILLED_NURSING_FACILITY): Payer: Medicare Other | Admitting: Internal Medicine

## 2017-11-30 DIAGNOSIS — K219 Gastro-esophageal reflux disease without esophagitis: Secondary | ICD-10-CM | POA: Diagnosis not present

## 2017-11-30 DIAGNOSIS — E039 Hypothyroidism, unspecified: Secondary | ICD-10-CM

## 2017-11-30 DIAGNOSIS — E43 Unspecified severe protein-calorie malnutrition: Secondary | ICD-10-CM | POA: Diagnosis not present

## 2017-11-30 DIAGNOSIS — J9611 Chronic respiratory failure with hypoxia: Secondary | ICD-10-CM | POA: Diagnosis not present

## 2017-11-30 DIAGNOSIS — R2681 Unsteadiness on feet: Secondary | ICD-10-CM | POA: Diagnosis not present

## 2017-11-30 DIAGNOSIS — F0391 Unspecified dementia with behavioral disturbance: Secondary | ICD-10-CM

## 2017-11-30 DIAGNOSIS — R400 Somnolence: Secondary | ICD-10-CM

## 2017-11-30 NOTE — Progress Notes (Signed)
Location:  [facility] Room Number: 25 Place of Service:  SNF [phone]) Provider:  [name] MD  [name], MD  Patient Care Team: [name], MD as PCP - General (Internal Medicine) [name], [facility] [name], MD as Consulting Physician (Orthopedic Surgery) [name], MD as Consulting Physician (Physical Medicine and Rehabilitation) Melina Schools, MD as Consulting Physician (Orthopedic Surgery) [name], MD as Consulting Physician (Otolaryngology) [name], [name], NP as Nurse Practitioner (Family Medicine)  Extended Emergency Contact Information Primary Emergency Contact: [name] Address: [address]          [name] of [place] Phone: [phone] Relation: Son Secondary Emergency Contact: [name] [place] of [place] Phone: [phone] Mobile Phone: [phone] Relation: Daughter  Code Status:  DNR/ hospice  Goals of care: Advanced Directive information Advanced Directives 11/30/2017  Does Patient Have a Medical Advance Directive? Yes  Type of Paramedic of [place];Living will;Out of facility DNR (pink MOST or yellow form)  Does patient want to make changes to medical advance directive? No - Patient declined  Copy of [place] in Chart? Yes  Pre-existing out of facility DNR order (yellow form or pink MOST form) Yellow form placed in chart (order not valid for inpatient use);Pink MOST form placed in chart (order not valid for inpatient use)     Chief Complaint  Patient presents with  . Medical Management of Chronic Issues    Routine Visit     HPI:  Pt is a 84 y.o. female seen today for medical management of chronic diseases. She is pleasantly confused and does not participate in HPI and ROS this visit. Per nursing, her po intake has decreased. She has episodes of agitation and wandering around. She has poor safety awareness. She  refuses care at times. She is followed by hospice services as well. Recently her klonopin dosing was increased by hospice team following which she was sedated and somnolent. Last week, the dosing of her clonazepam was decreased from 2 mg tid to 1 mg tid. Her haldol was also decreased from 5 mg im q4h prn to 2.5 mg q4h prn for severe agitation. On review of nursing note, pt has been sleeping for most part of the day, yesterday, she was somnolent during son's visit. No recent fever reported. Behavior overall calm. Has not required any haldol since last Wednesday on review.    Past Medical History:  Diagnosis Date  . Anemia, unspecified 03/18/2011  . Anxiety state, unspecified 01/2011  . Blood in stool 06/15/2012  . Corns and callosities 07/01/2011  . Dementia   . Diverticulosis 02/2011  . Dizziness   . DJD (degenerative joint disease)   . Dysuria 06/17/2011  . Hemorrhoids 02/2011  . History of radiation therapy 03/10/17-04/22/17   left lung 60 Gy in 30 fractions  . Low back pain   . Lung cancer ([place])    left  . Mitral valve problem thickened   thickened  . Osteoarthritis of both hands 10/09/2015  . Osteoporosis 02/2011  . Other abnormal blood chemistry 0/30/2012  . Other acquired deformity of toe 12/16/2011  . Pain in joint, site unspecified 01/2012  . 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 Sacroiliitis, not elsewhere classified ([place]) 05/2011  . Seizures ([place])    Remotely  . Unspecified hypothyroidism 01/2011  . 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 Vitamin D deficiency 01/2011  Past Surgical History:  Procedure Laterality Date  . bletheroplasty    . CATARACT EXTRACTION W/ INTRAOCULAR LENS  IMPLANT, BILATERAL  2010  . CERVICAL LAMINECTOMY  2005  . CYSTOSCOPY WITH RETROGRADE PYELOGRAM, URETEROSCOPY AND STENT PLACEMENT Bilateral 11/16/2016   Procedure: CYSTOSCOPY WITH RETROGRADE PYELOGRAMS/CLOT EVACUATION;  Surgeon: Irine Seal, MD;   Location: [facility];  Service: Urology;  Laterality: Bilateral;  . HAMMER TOE SURGERY  2013   right 2nd toe Dr. Mallie Mussel  . IVC FILTER PLACEMENT ([facility] HX)    . JOINT REPLACEMENT Bilateral 2002 Right, 1992 Left   knees  . TRANSURETHRAL RESECTION OF BLADDER TUMOR N/A 11/16/2016   Procedure: TRANSURETHRAL RESECTION OF BLADDER TUMOR (TURBT);  Surgeon: Irine Seal, MD;  Location: [facility];  Service: Urology;  Laterality: N/A;    Allergies  Allergen Reactions  . Macrodantin [Nitrofurantoin]   . Morphine And Related Other (See Comments)    Altered mental state   . Sulfa Antibiotics Other (See Comments)    Reaction: unknown    Outpatient Encounter Medications as of 11/30/2017  Medication Sig  . acetaminophen (TYLENOL) 500 MG tablet Take 500 mg by mouth 3 (three) times daily.  . clonazePAM (KLONOPIN) 1 MG tablet Take 1 mg by mouth every 8 (eight) hours.  . haloperidol lactate (HALDOL) 5 MG/ML injection Inject 2.5 mg into the muscle every 4 (four) hours as needed.  Marland Kitchen ipratropium-albuterol (DUONEB) 0.5-2.5 (3) MG/3ML SOLN Take 3 mLs by nebulization every 6 (six) hours as needed.  Marland Kitchen levothyroxine (SYNTHROID, LEVOTHROID) 100 MCG tablet Take 1 tablet (100 mcg total) by mouth daily before breakfast. For thyroid  . mirtazapine (REMERON) 15 MG tablet Take 15 mg by mouth at bedtime.  . Morphine Sulfate (MORPHINE CONCENTRATE) 10 mg / 0.5 ml concentrated solution Take 5 mg by mouth every 4 (four) hours as needed for severe pain or shortness of breath.  . Nutritional Supplements (BOOST PLUS PO) Take 1 Can by mouth 3 (three) times daily between meals.  . OXYGEN Inhale 2 L/min into the lungs as needed. To keep O2 Sat >90%  . phenytoin (DILANTIN) 100 MG ER capsule Take 100 mg by mouth daily.   . phenytoin (DILANTIN) 200 MG ER capsule Take 200 mg by mouth at bedtime.  Marland Kitchen QUEtiapine (SEROQUEL) 100 MG tablet Take 100 mg by mouth 2 (two) times daily.   Marland Kitchen umeclidinium-vilanterol (ANORO ELLIPTA) 62.5-25 MCG/INH AEPB Inhale 1  puff into the lungs daily.  . [DISCONTINUED] acetaminophen (TYLENOL) 325 MG tablet Take 650 mg by mouth 2 (two) times daily as needed.   . [DISCONTINUED] acetaminophen (TYLENOL) 325 MG tablet Take 650 mg by mouth 2 (two) times daily.  . [DISCONTINUED] clonazePAM (KLONOPIN) 0.5 MG tablet Take 0.25 mg by mouth every 8 (eight) hours.  . [DISCONTINUED] magnesium hydroxide (MILK OF MAGNESIA) 400 MG/5ML suspension Take 30 mLs by mouth daily as needed for mild constipation.   No facility-administered encounter medications on file as of 11/30/2017.     Review of Systems  Unable to perform ROS: Dementia    Immunization History  Administered Date(s) Administered  . Influenza Split 02/18/2017  . Influenza Whole 03/17/2007, 02/16/2009, 01/30/2010, 02/16/2013  . Influenza-Unspecified 03/02/2014, 02/22/2015, 02/28/2016  . PPD Test 07/09/2010, 02/04/2015, 04/04/2015, 06/04/2015  . Pneumococcal Conjugate-13 01/13/2017  . Pneumococcal Polysaccharide-23 05/19/2005, 05/23/2013  . Td 05/19/2005  . Tetanus 10/06/2012  . Zoster 05/19/2012   Pertinent  Health Maintenance Due  Topic Date Due  . DEXA SCAN  11/19/1994  . INFLUENZA VACCINE  02/25/2018 (Originally 12/17/2017)  . PNA vac Low Risk Adult  Completed   Fall Risk  05/25/2017 03/13/2017 02/18/2017 01/05/2017 10/09/2015  Falls in the past year? Yes No Yes No No  Number falls in past yr: 1 - 2 or more - -  Injury with Fall? Yes - No - -  Comment - - - - -  Risk Factor Category  - - High Fall Risk - -  Risk for fall due to : History of fall(s) - - - -  Follow up Falls evaluation completed - - - -   Functional Status Survey:    Vitals:   11/30/17 1050  BP: 101/64  Pulse: 84  Resp: 18  Temp: (!) 97 F (36.1 C)  TempSrc: Oral  SpO2: 93%  Weight: 103 lb 12.8 oz (47.1 kg)  Height: 5\' 4"  (1.626 m)   Body mass index is 17.82 kg/m.   Wt Readings from Last 3 Encounters:  11/30/17 103 lb 12.8 oz (47.1 kg)  11/23/17 103 lb 12.8 oz (47.1 kg)    11/02/17 106 lb 8 oz (48.3 kg)   Physical Exam  Constitutional:  Thin built and frail elderly female in no acute distress  HENT:  Head: Normocephalic and atraumatic.  Right Ear: External ear normal.  Left Ear: External ear normal.  Nose: Nose normal.  Thick mucus in oropharyngeal area. Moist mucus membrane  Eyes: Pupils are equal, round, and reactive to light. Conjunctivae are normal. Right eye exhibits no discharge. Left eye exhibits no discharge.  Neck: Normal range of motion. Neck supple.  Cardiovascular: Normal rate and regular rhythm.  Murmur heard. Pulmonary/Chest: Effort normal. No respiratory distress. She has no wheezes. She has no rales.  Poor air entry  Abdominal: Soft. Bowel sounds are normal. There is no tenderness. There is no guarding.  Musculoskeletal: She exhibits no edema.  Can move all 4 extremities, unsteady gait, needs 2 person assistance with transfer  Lymphadenopathy:    She has no cervical adenopathy.  Neurological:  Pleasantly confused, sleepy this morning during visit, follows few simple command  Skin: Skin is warm and dry. She is not diaphoretic.    Labs reviewed: Recent Labs    04/08/17 1506 04/08/17 2336  04/30/17 0940 05/15/17 1136 06/11/17  NA 139 138   < > 142 140 145  145  K 5.0 4.4   < > 4.5 4.7 4.3  4.3  CL 104 110  --   --   --   --   CO2 28 22  --  29 27  --   GLUCOSE 157* 138*  --  92 93  --   BUN 33* 26*   < > 31.3* 24.3 29*  29*  CREATININE 0.78 0.55   < > 0.9 0.9 0.6  0.63  CALCIUM 8.9 7.7*  --  9.6 9.5 9.1   < > = values in this interval not displayed.   Recent Labs    04/30/17 0940 05/15/17 1136 06/11/17  AST 19 16 14  14   ALT 11 12 8  8   ALKPHOS 81 75 76  76  BILITOT 0.30 0.22 0.3  PROT 7.4 7.5 6.7  ALBUMIN 3.4* 2.8* 3.2   Recent Labs    04/08/17 1506  04/11/17 0636  04/30/17 0940 05/15/17 1136 06/11/17 06/15/17  WBC 9.3   < > 3.8*   < > 7.1 9.1 13.1 9.2  NEUTROABS 8.3*  --   --   --  5.8 7.1*  --   --  HGB 10.9*   < > 9.7*   < > 12.6 11.8 12.4 11.7*  HCT 32.9*   < > 29.3*   < > 39.6 37.0 37.1 35*  MCV 100.3*   < > 102.1*  --  106.5* 105.1*  --   --   PLT 82*   < > 79*   < > 208 342  --  259   < > = values in this interval not displayed.   Lab Results  Component Value Date   TSH 1.13 10/08/2017   TSH 1.13 10/08/2017   Lab Results  Component Value Date   HGBA1C 5.4 03/08/2015   Lab Results  Component Value Date   CHOL 235 (H) 03/06/2009   HDL 108.40 03/06/2009   LDLDIRECT 108.2 03/06/2009    Significant Diagnostic Results in last 30 days:  No results found.  Assessment/Plan  1. Chronic respiratory failure with hypoxia (HCC) Continue oxygen by nasal canula. Continue morphine 5 mg every 4 hour as needed for dyspnea. Continue anoro ellipta. Continue duoneb as needed.   2. Gastroesophageal reflux disease without esophagitis Aspiration precautions.   3. Hypothyroidism, unspecified type Continue levothyroxine and monitor  4. Dementia with behavioral disturbance, unspecified dementia type Advanced, continue seroquel 100 mg bid. Currently on clonazepam 1 mg tid and haldol 2.5 mg q4h prn. Has not required haldol. Change clonazepam to 1 mg bid with 1 mg in afternoon if needed only with her sleepiness. Out of bed daily. With her not rquiring haldol, change this to 2.5 mg q6hprn only.   5. Unstable gait Assistance with transfer, with her deconditioning, dementia and somnolence, high fall risk. Fall precautions.   6. Severe protein-calorie malnutrition (HCC) Continue remeron 15 mg daily, encourage po intake as tolerated, aspiration precautions  7. Somnolence Her deconditioning and polypharmacy could both be contributing along with her dementia. Decrease clonazepam to 1 mg bid with afternoon dosing only if needed. She was on 0.25 mg tid before and sudden increase to 2 mg tid and then 1 mg tid could still be a rapid increase for this frail elderly patient with dementia.      Family/ staff Communication: reviewed care plan with patient's charge nurse.    Labs/tests ordered:  none   [name], MD Internal Medicine [facility] [address] [place], [place] [address] Cell Phone (Monday-Friday 8 am - 5 pm): [phone] On Call: [phone] and follow prompts after 5 pm and on weekends Office Phone: [phone] Office Fax: [phone]

## 2017-12-03 DIAGNOSIS — Z86718 Personal history of other venous thrombosis and embolism: Secondary | ICD-10-CM | POA: Diagnosis not present

## 2017-12-03 DIAGNOSIS — E46 Unspecified protein-calorie malnutrition: Secondary | ICD-10-CM | POA: Diagnosis not present

## 2017-12-03 DIAGNOSIS — J849 Interstitial pulmonary disease, unspecified: Secondary | ICD-10-CM | POA: Diagnosis not present

## 2017-12-03 DIAGNOSIS — F0281 Dementia in other diseases classified elsewhere with behavioral disturbance: Secondary | ICD-10-CM | POA: Diagnosis not present

## 2017-12-03 DIAGNOSIS — C3490 Malignant neoplasm of unspecified part of unspecified bronchus or lung: Secondary | ICD-10-CM | POA: Diagnosis not present

## 2017-12-03 DIAGNOSIS — J9610 Chronic respiratory failure, unspecified whether with hypoxia or hypercapnia: Secondary | ICD-10-CM | POA: Diagnosis not present

## 2017-12-04 DIAGNOSIS — J849 Interstitial pulmonary disease, unspecified: Secondary | ICD-10-CM | POA: Diagnosis not present

## 2017-12-04 DIAGNOSIS — J9610 Chronic respiratory failure, unspecified whether with hypoxia or hypercapnia: Secondary | ICD-10-CM | POA: Diagnosis not present

## 2017-12-04 DIAGNOSIS — Z86718 Personal history of other venous thrombosis and embolism: Secondary | ICD-10-CM | POA: Diagnosis not present

## 2017-12-04 DIAGNOSIS — E46 Unspecified protein-calorie malnutrition: Secondary | ICD-10-CM | POA: Diagnosis not present

## 2017-12-04 DIAGNOSIS — F0281 Dementia in other diseases classified elsewhere with behavioral disturbance: Secondary | ICD-10-CM | POA: Diagnosis not present

## 2017-12-04 DIAGNOSIS — C3490 Malignant neoplasm of unspecified part of unspecified bronchus or lung: Secondary | ICD-10-CM | POA: Diagnosis not present

## 2017-12-07 DIAGNOSIS — C3490 Malignant neoplasm of unspecified part of unspecified bronchus or lung: Secondary | ICD-10-CM | POA: Diagnosis not present

## 2017-12-07 DIAGNOSIS — F0281 Dementia in other diseases classified elsewhere with behavioral disturbance: Secondary | ICD-10-CM | POA: Diagnosis not present

## 2017-12-07 DIAGNOSIS — Z86718 Personal history of other venous thrombosis and embolism: Secondary | ICD-10-CM | POA: Diagnosis not present

## 2017-12-07 DIAGNOSIS — E46 Unspecified protein-calorie malnutrition: Secondary | ICD-10-CM | POA: Diagnosis not present

## 2017-12-07 DIAGNOSIS — J849 Interstitial pulmonary disease, unspecified: Secondary | ICD-10-CM | POA: Diagnosis not present

## 2017-12-07 DIAGNOSIS — J9610 Chronic respiratory failure, unspecified whether with hypoxia or hypercapnia: Secondary | ICD-10-CM | POA: Diagnosis not present

## 2017-12-17 DEATH — deceased

## 2019-04-05 IMAGING — CT NM PET TUM IMG INITIAL (PI) SKULL BASE T - THIGH
1 of 8 series · 1 of 25 positions shown · non-contrast
Comparison: CT on 12/04/2016 and 11/16/2016

CLINICAL DATA: Initial treatment strategy for left upper lobe lung
mass.

EXAM:
NUCLEAR MEDICINE PET SKULL BASE TO THIGH
TECHNIQUE: 6.8 mCi F-18 FDG was injected intravenously. Full-ring PET imaging
was performed from the skull base to thigh after the radiotracer. CT
data was obtained and used for attenuation correction and anatomic
localization.
FASTING BLOOD GLUCOSE:  Value: 99 mg/dl

[Series 4: ct sk_thigh 5.0 b31f · axial · 5.0mm · 0.98mm/px · 1 of 201 slices shown]
[im 201/201  brain]
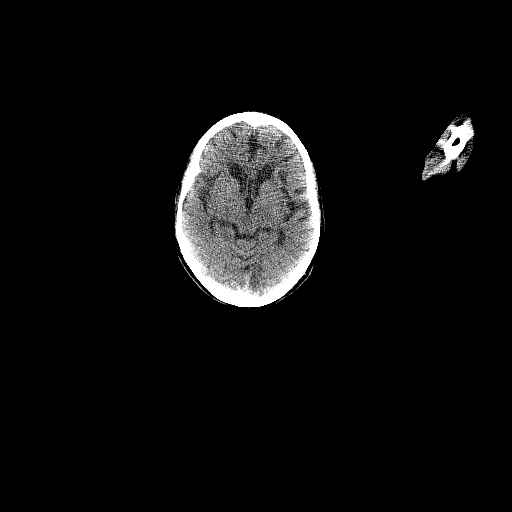

[1 of 25 positions shown; findings below may reference images not displayed]

FINDINGS: NECK:  No hypermetabolic lymph nodes or masses.

CHEST: No hypermetabolic mediastinal or axillary lymph nodes
identified. Mild hypermetabolic adenopathy seen in the left hilum,
with SUV max measuring 6.7.

Chronic interstitial lung disease is again seen in the peripheral
lung zones bilaterally. Mild to moderate emphysema also noted. A
cm irregular mass is seen in the lingula, which has SUV max of 12.5.

A new 11 mm pulmonary nodule seen in the posterior right upper lobe
on image [DATE], which has SUV max of 3.7. Given short time course of
appearance since previous study approximately 1 month ago, this
favors an inflammatory or infectious etiology over neoplasm.

ABDOMEN/PELVIS: No abnormal hypermetabolic activity within the
liver, pancreas, adrenal glands, or spleen. No hypermetabolic lymph
nodes in the abdomen or pelvis.

Diverticulosis predominately involves the sigmoid colon, without
evidence of diverticulitis. Aortic atherosclerosis. IVC filter in
place. Tiny nonobstructive calculus seen in lower pole of left
kidney.

SKELETON: No focal hypermetabolic bone lesions to suggest skeletal
metastasis.
IMPRESSION: Persistent 2.9 cm hypermetabolic lingular mass, suspicious for
primary bronchogenic carcinoma.

Mild hypermetabolic left hilar lymphadenopathy, highly suspicious
for metastatic disease. No other sites of metastatic disease
identified.

New 11 mm hypermetabolic right upper lobe pulmonary nodule. Rapid
appearance since recent CT approximately 1 month ago favors
inflammatory or infectious etiology over neoplasm. Continued
followup by chest CT recommended.

Chronic interstitial lung disease and centrilobular emphysema.

Colonic diverticulosis, without radiographic evidence of
diverticulitis.

Nonobstructing left nephrolithiasis.

Aortic atherosclerosis.
